# Patient Record
Sex: Female | Born: 1955 | Race: White | Hispanic: No | Marital: Single | Smoking: Former smoker
Health system: Southern US, Community
[De-identification: ages and names within clinical notes are randomized; demographics above are authoritative.]

## PROBLEM LIST (undated history)

## (undated) DIAGNOSIS — C50919 Malignant neoplasm of unspecified site of unspecified female breast: Secondary | ICD-10-CM

## (undated) DIAGNOSIS — M3500 Sicca syndrome, unspecified: Secondary | ICD-10-CM

## (undated) DIAGNOSIS — R0602 Shortness of breath: Secondary | ICD-10-CM

## (undated) DIAGNOSIS — G4730 Sleep apnea, unspecified: Secondary | ICD-10-CM

## (undated) DIAGNOSIS — M609 Myositis, unspecified: Secondary | ICD-10-CM

## (undated) DIAGNOSIS — M791 Myalgia, unspecified site: Secondary | ICD-10-CM

## (undated) DIAGNOSIS — E785 Hyperlipidemia, unspecified: Secondary | ICD-10-CM

## (undated) DIAGNOSIS — L500 Allergic urticaria: Secondary | ICD-10-CM

## (undated) DIAGNOSIS — K5790 Diverticulosis of intestine, part unspecified, without perforation or abscess without bleeding: Secondary | ICD-10-CM

## (undated) DIAGNOSIS — M359 Systemic involvement of connective tissue, unspecified: Secondary | ICD-10-CM

## (undated) DIAGNOSIS — D689 Coagulation defect, unspecified: Secondary | ICD-10-CM

## (undated) DIAGNOSIS — G894 Chronic pain syndrome: Secondary | ICD-10-CM

## (undated) DIAGNOSIS — I890 Lymphedema, not elsewhere classified: Secondary | ICD-10-CM

## (undated) DIAGNOSIS — K219 Gastro-esophageal reflux disease without esophagitis: Secondary | ICD-10-CM

## (undated) DIAGNOSIS — I219 Acute myocardial infarction, unspecified: Secondary | ICD-10-CM

## (undated) DIAGNOSIS — H6690 Otitis media, unspecified, unspecified ear: Secondary | ICD-10-CM

## (undated) DIAGNOSIS — M6089 Other myositis, multiple sites: Secondary | ICD-10-CM

## (undated) DIAGNOSIS — K279 Peptic ulcer, site unspecified, unspecified as acute or chronic, without hemorrhage or perforation: Secondary | ICD-10-CM

## (undated) DIAGNOSIS — M6080 Other myositis, unspecified site: Secondary | ICD-10-CM

## (undated) DIAGNOSIS — R1013 Epigastric pain: Secondary | ICD-10-CM

## (undated) DIAGNOSIS — R011 Cardiac murmur, unspecified: Secondary | ICD-10-CM

## (undated) DIAGNOSIS — R9389 Abnormal findings on diagnostic imaging of other specified body structures: Secondary | ICD-10-CM

## (undated) DIAGNOSIS — M797 Fibromyalgia: Secondary | ICD-10-CM

## (undated) HISTORY — DX: Abnormal findings on diagnostic imaging of other specified body structures: R93.89

## (undated) HISTORY — DX: Lymphedema, not elsewhere classified: I89.0

## (undated) HISTORY — DX: Peptic ulcer, site unspecified, unspecified as acute or chronic, without hemorrhage or perforation: K27.9

## (undated) HISTORY — DX: Cardiac murmur, unspecified: R01.1

## (undated) HISTORY — DX: Hyperlipidemia, unspecified: E78.5

## (undated) HISTORY — DX: Fibromyalgia: M79.7

## (undated) HISTORY — DX: Acute myocardial infarction, unspecified: I21.9

## (undated) HISTORY — DX: Malignant neoplasm of unspecified site of unspecified female breast: C50.919

## (undated) HISTORY — PX: BREAST SURGERY: SHX581

## (undated) HISTORY — DX: Otitis media, unspecified, unspecified ear: H66.90

## (undated) HISTORY — DX: Diverticulosis of intestine, part unspecified, without perforation or abscess without bleeding: K57.90

---

## 2006-07-16 ENCOUNTER — Ambulatory Visit: Payer: Self-pay

## 2007-10-19 ENCOUNTER — Ambulatory Visit: Payer: Self-pay | Admitting: Internal Medicine

## 2008-01-26 ENCOUNTER — Ambulatory Visit: Payer: Self-pay | Admitting: Internal Medicine

## 2008-04-11 ENCOUNTER — Ambulatory Visit: Payer: Self-pay | Admitting: Internal Medicine

## 2008-05-08 ENCOUNTER — Emergency Department: Payer: Self-pay | Admitting: Emergency Medicine

## 2008-12-12 ENCOUNTER — Ambulatory Visit: Payer: Self-pay | Admitting: Internal Medicine

## 2009-12-21 ENCOUNTER — Ambulatory Visit: Payer: Self-pay | Admitting: Internal Medicine

## 2010-02-05 ENCOUNTER — Ambulatory Visit: Payer: Self-pay | Admitting: Internal Medicine

## 2010-06-27 ENCOUNTER — Ambulatory Visit: Payer: Self-pay | Admitting: Internal Medicine

## 2010-12-27 ENCOUNTER — Ambulatory Visit: Payer: Self-pay | Admitting: Internal Medicine

## 2010-12-27 LAB — HM MAMMOGRAPHY: HM Mammogram: NORMAL

## 2011-04-28 LAB — HM PAP SMEAR: HM Pap smear: NORMAL

## 2011-05-27 ENCOUNTER — Telehealth: Payer: Self-pay | Admitting: Internal Medicine

## 2011-05-27 NOTE — Telephone Encounter (Signed)
We can put her in at 11:30 tomorrow morning, but let her know this will be an overbook and she may have to wait.

## 2011-05-27 NOTE — Telephone Encounter (Signed)
Please call her.  She said that she would take the Wednesday appt, until she heard from you.

## 2011-05-29 ENCOUNTER — Encounter: Payer: Self-pay | Admitting: Internal Medicine

## 2011-05-29 ENCOUNTER — Ambulatory Visit (INDEPENDENT_AMBULATORY_CARE_PROVIDER_SITE_OTHER): Payer: BC Managed Care – PPO | Admitting: Internal Medicine

## 2011-05-29 VITALS — BP 112/74 | HR 75 | Temp 98.6°F | Resp 16 | Ht 63.0 in | Wt 179.2 lb

## 2011-05-29 DIAGNOSIS — E119 Type 2 diabetes mellitus without complications: Secondary | ICD-10-CM | POA: Insufficient documentation

## 2011-05-29 DIAGNOSIS — J329 Chronic sinusitis, unspecified: Secondary | ICD-10-CM

## 2011-05-29 DIAGNOSIS — H6693 Otitis media, unspecified, bilateral: Secondary | ICD-10-CM | POA: Insufficient documentation

## 2011-05-29 MED ORDER — SULFAMETHOXAZOLE-TRIMETHOPRIM 800-160 MG PO TABS: 1.0000 | ORAL_TABLET | Freq: Two times a day (BID) | ORAL | Status: AC

## 2011-05-29 NOTE — Progress Notes (Signed)
Subjective:    Patient ID: Kristina Mccoy. Rosario, female    DOB: 11-22-1955, 55 y.o.   MRN: [id number]  HPI Kristina Mccoy is a 55 year old female with a history of chronic otitis media. She has previously been evaluated twice by specialists for this condition. Recently she has had several courses of antibiotics for recurrent otitis media, most recently 2 weeks ago at which time she received a seven-day course of Levaquin. She reports modest improvement with Levaquin and then almost immediate return of her symptoms after completing antibiotics. Symptoms are described as decreased hearing, ear pain, sinus congestion, headache, purulent nasal drainage, bilateral eyes swelling. She denies any fevers but does report some chills. She denies any cough, shortness of breath, or chest pain.  Outpatient Encounter Prescriptions as of 05/29/2011  Medication Sig Dispense Refill  . ALPRAZolam (XANAX) 0.5 MG tablet Take 0.5 mg by mouth 2 (two) times daily as needed.        . ciprofloxacin (CIPRO) 500 MG/5ML (10%) suspension Take by mouth 2 (two) times daily.        Marland Kitchen GLIMEPIRIDE PO Take 1 tablet by mouth daily.        . ranitidine (ZANTAC) 300 MG tablet Take 300 mg by mouth at bedtime.        . sulfamethoxazole-trimethoprim (SEPTRA DS) 800-160 MG per tablet Take 1 tablet by mouth 2 (two) times daily.  28 tablet  0    Review of Systems  Constitutional: Positive for chills and fatigue. Negative for fever and unexpected weight change.  HENT: Positive for ear pain, congestion, facial swelling, postnasal drip and sinus pressure. Negative for hearing loss, nosebleeds, sore throat, rhinorrhea, sneezing, mouth sores, trouble swallowing, neck pain, neck stiffness, voice change, tinnitus and ear discharge.   Eyes: Negative for pain, discharge, redness and visual disturbance.  Respiratory: Negative for cough, chest tightness, shortness of breath, wheezing and stridor.   Cardiovascular: Negative for chest pain, palpitations and leg  swelling.  Musculoskeletal: Negative for myalgias and arthralgias.  Skin: Negative for color change and rash.  Neurological: Negative for dizziness, weakness, light-headedness and headaches.  Hematological: Negative for adenopathy.    BP 112/74  Pulse 75  Temp(Src) 98.6 F (37 C) (Oral)  Resp 16  Ht 5\' 3"  (1.6 m)  Wt 179 lb 4 oz (81.307 kg)  BMI 31.75 kg/m2  SpO2 96%     Objective:   Physical Exam  Constitutional: She is oriented to person, place, and time. She appears well-developed and well-nourished. No distress.  HENT:  Head: Normocephalic and atraumatic.  Right Ear: External ear normal. No drainage, swelling or tenderness. Tympanic membrane is bulging. Tympanic membrane is not perforated and not erythematous. A middle ear effusion is present.  Left Ear: External ear normal. No drainage, swelling or tenderness. Tympanic membrane is erythematous and bulging. Tympanic membrane is not perforated. A middle ear effusion is present.  Nose: Nose normal.  Mouth/Throat: Oropharynx is clear and moist. No oropharyngeal exudate.  Eyes: Conjunctivae and EOM are normal. Pupils are equal, round, and reactive to light. Right eye exhibits no discharge. Left eye exhibits no discharge. No scleral icterus.  Neck: Normal range of motion. Neck supple. No tracheal deviation present. No thyromegaly present.  Cardiovascular: Normal rate, regular rhythm, normal heart sounds and intact distal pulses.  Exam reveals no gallop and no friction rub.   No murmur heard. Pulmonary/Chest: Effort normal and breath sounds normal. No accessory muscle usage. Not tachypneic. No respiratory distress. She has no wheezes. She has  no rales. She exhibits no tenderness.  Musculoskeletal: Normal range of motion. She exhibits no edema and no tenderness.  Lymphadenopathy:    She has no cervical adenopathy.  Neurological: She is alert and oriented to person, place, and time. No cranial nerve deficit. She exhibits normal muscle  tone. Coordination normal.  Skin: Skin is warm and dry. No rash noted. She is not diaphoretic. No erythema. No pallor.  Psychiatric: She has a normal mood and affect. Her behavior is normal. Judgment and thought content normal.          Assessment & Plan:  1. Sinusitis - patient with recurrent episodes of sinusitis and otitis media. Exam today is consistent with left OM and bilateral maxillary sinusitis. She has completed several courses of antibiotics including Augmentin and Levaquin, each time having brief improvement and then recurrent symptoms. She does have a history of diabetes mellitus, and a question whether she may have an atypical or resistant infection. She has been evaluated by ENT however no culture has been performed to date. We will request a CT of her sinuses for further evaluation. We will also refer her to allergy and immunology for evaluation. Question if she may have allergies which are triggering her recurrent infections. Also question if she may have decreased immune function given her history of chronic recurrent foot infections as a child and as an adult. We will start her on Bactrim double strength tablets twice daily x2 weeks while workup is being completed. She will add Zyrtec to her regimen. She will continue to use Advil when necessary. She will call or return to clinic immediately if symptoms worsen. Otherwise we will see her back in 2 weeks.

## 2011-05-29 NOTE — Patient Instructions (Signed)

## 2011-06-11 ENCOUNTER — Ambulatory Visit: Payer: Self-pay | Admitting: Internal Medicine

## 2011-06-14 ENCOUNTER — Ambulatory Visit (INDEPENDENT_AMBULATORY_CARE_PROVIDER_SITE_OTHER): Payer: BC Managed Care – PPO | Admitting: Internal Medicine

## 2011-06-14 ENCOUNTER — Encounter: Payer: Self-pay | Admitting: Internal Medicine

## 2011-06-14 VITALS — BP 115/74 | HR 76 | Temp 98.2°F | Resp 16 | Ht 63.0 in | Wt 133.5 lb

## 2011-06-14 DIAGNOSIS — E119 Type 2 diabetes mellitus without complications: Secondary | ICD-10-CM

## 2011-06-14 DIAGNOSIS — IMO0001 Reserved for inherently not codable concepts without codable children: Secondary | ICD-10-CM

## 2011-06-14 DIAGNOSIS — H6690 Otitis media, unspecified, unspecified ear: Secondary | ICD-10-CM

## 2011-06-14 DIAGNOSIS — M797 Fibromyalgia: Secondary | ICD-10-CM

## 2011-06-14 LAB — GLUCOSE, POCT (MANUAL RESULT ENTRY): POC Glucose: 149

## 2011-06-14 MED ORDER — GABAPENTIN 100 MG PO CAPS: 100.0000 mg | ORAL_CAPSULE | Freq: Three times a day (TID) | ORAL | Status: DC

## 2011-06-15 DIAGNOSIS — H6690 Otitis media, unspecified, unspecified ear: Secondary | ICD-10-CM | POA: Insufficient documentation

## 2011-06-15 DIAGNOSIS — M797 Fibromyalgia: Secondary | ICD-10-CM | POA: Insufficient documentation

## 2011-06-15 DIAGNOSIS — J329 Chronic sinusitis, unspecified: Secondary | ICD-10-CM | POA: Insufficient documentation

## 2011-06-15 NOTE — Progress Notes (Signed)
Subjective:    Patient ID: Kristina Mccoy. Holness, female    DOB: 1956/04/02, 55 y.o.   MRN: [id number]  HPI Ms. [name] is a 55 year old female with a history of diabetes, and chronic sinus and ear infections who presents for followup. She was recently treated with Bactrim for left otitis media. She reports significant improvement in her symptoms after a course of Bactrim. She also had a CT performed of her sinuses to help evaluate for changes consistent with chronic sinusitis. 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 She reports the swelling under her eyes and pain in her cheeks is much improved after the course of Bactrim. She denies any fever, chills, headache.  Today she is concerned about chronic pain which she has had in her legs and back for several years. She reports extensive evaluation including evaluation for osteoarthritis and rheumatoid arthritis. To date this evaluation has been negative. She questions whether she may have fibromyalgia. She was recently reading about fibromyalgia and found that she met several of the criteria.  Outpatient Encounter Prescriptions as of 06/14/2011  Medication Sig Dispense Refill  . ALPRAZolam (XANAX) 0.5 MG tablet Take 0.5 mg by mouth 2 (two) times daily as needed.        . ciprofloxacin (CIPRO) 500 MG/5ML (10%) suspension Take by mouth 2 (two) times daily.        Marland Kitchen GLIMEPIRIDE PO Take 0.5 mg by mouth 2 (two) times daily.       . meloxicam (MOBIC) 15 MG tablet Take 15 mg by mouth daily.        . ranitidine (ZANTAC) 300 MG tablet Take 300 mg by mouth at bedtime.        . gabapentin (NEURONTIN) 100 MG capsule Take 1 capsule (100 mg total) by mouth 3 (three) times daily.  90 capsule  5    Review of Systems  Constitutional: Negative for fever, chills, appetite change, fatigue and unexpected weight change.  HENT: Positive for ear pain, congestion, postnasal drip and sinus pressure. Negative for sore  throat, trouble swallowing, neck pain and voice change.   Eyes: Negative for visual disturbance.  Respiratory: Negative for cough, shortness of breath and wheezing.   Cardiovascular: Negative for chest pain, palpitations and leg swelling.  Gastrointestinal: Negative for nausea, vomiting, abdominal pain, diarrhea, constipation, blood in stool, abdominal distention and anal bleeding.  Genitourinary: Negative for dysuria and flank pain.  Musculoskeletal: Positive for myalgias, back pain, arthralgias and gait problem. Negative for joint swelling.  Skin: Negative for color change and rash.  Neurological: Positive for headaches. Negative for dizziness, tremors, weakness and numbness.  Hematological: Negative for adenopathy. Does not bruise/bleed easily.  Psychiatric/Behavioral: Negative for suicidal ideas, sleep disturbance and dysphoric mood. The patient is not nervous/anxious.    BP 115/74  Pulse 76  Temp(Src) 98.2 F (36.8 C) (Oral)  Resp 16  Ht 5\' 3"  (1.6 m)  Wt 133 lb 8 oz (60.555 kg)  BMI 23.65 kg/m2  SpO2 99%     Objective:   Physical Exam  Constitutional: She is oriented to person, place, and time. She appears well-developed and well-nourished. No distress.  HENT:  Head: Normocephalic and atraumatic.  Right Ear: Tympanic membrane, external ear and ear canal normal. Tympanic membrane is not erythematous. No middle ear effusion.  Left Ear: External ear and ear canal normal. Tympanic membrane is erythematous. A middle ear effusion is present.  Ears:  Nose: Nose normal.  Mouth/Throat: Oropharynx is clear and moist. No oropharyngeal exudate.       Left TM buldging with purulent fluid behind TM and mild erythema (however much improved compared to previous)  Eyes: Conjunctivae are normal. Pupils are equal, round, and reactive to light. Right eye exhibits no discharge. Left eye exhibits no discharge. No scleral icterus.  Neck: Normal range of motion. Neck supple. No tracheal deviation  present. No thyromegaly present.  Cardiovascular: Normal rate, regular rhythm, normal heart sounds and intact distal pulses.  Exam reveals no gallop and no friction rub.   No murmur heard. Pulmonary/Chest: Effort normal and breath sounds normal. No respiratory distress. She has no wheezes. She has no rales. She exhibits no tenderness.  Musculoskeletal: Normal range of motion. She exhibits no edema and no tenderness.  Lymphadenopathy:    She has no cervical adenopathy.  Neurological: She is alert and oriented to person, place, and time. No cranial nerve deficit. She exhibits normal muscle tone. Coordination normal.  Skin: Skin is warm and dry. No rash noted. She is not diaphoretic. No erythema. No pallor.  Psychiatric: She has a normal mood and affect. Her behavior is normal. Judgment and thought content normal.          Assessment & Plan:  1.  Chronic Otitis Media - patient with chronic left otitis media. Symptoms much improved after a course of Bactrim. CT of the sinuses showed no evidence of chronic sinusitis. She is scheduled to followup with an allergist to test for allergies which may be contributing to her recurrent symptoms. We will request a copy of this report. She will call or return to clinic should her symptoms recur.  2. Fibromyalgia - patient reports chronic pain in her legs and back. Today we went through the diagnostic criteria for fibromyalgia including symptom severity scaling. Based on 2010 criteria for fibromyalgia, her symptoms are consistent with this disorder. We discussed potential treatments for fibromyalgia including SSRIs such as Cymbalta. We also discussed using medication such as Neurontin or Lyrica for pain. We'll plan to start her on Neurontin 100 mg at bedtime to see if there's any improvement in her pain. She will return to clinic in one month.

## 2011-06-18 ENCOUNTER — Telehealth: Payer: Self-pay | Admitting: Internal Medicine

## 2011-06-18 NOTE — Telephone Encounter (Signed)
Patient called also and stated that when she takes the Gabapentin and it makes her extremely itchy especially around the eyes.  Please advise.  Informed her also that her that her CT sinuses were normal.

## 2011-06-18 NOTE — Telephone Encounter (Signed)
She should stop the Neurontin and we should change to Lyrica 25mg  at bedtime. We can call in a 30 day supply with 3 refills.

## 2011-06-24 ENCOUNTER — Other Ambulatory Visit: Payer: Self-pay | Admitting: *Deleted

## 2011-06-24 ENCOUNTER — Encounter: Payer: Self-pay | Admitting: Internal Medicine

## 2011-06-24 MED ORDER — PREGABALIN 25 MG PO CAPS: 25.0000 mg | ORAL_CAPSULE | Freq: Every day | ORAL | Status: DC

## 2011-06-24 NOTE — Telephone Encounter (Signed)
Patient notified to stop gabapentin. Also rx for lyrica sent to pharmacy.

## 2011-06-24 NOTE — Telephone Encounter (Signed)
That is fine 

## 2011-06-24 NOTE — Telephone Encounter (Signed)
Patient is asking if she can go back to the 150 mg of ranitidine bid instead of taking the 300 mg once daily. She says that that it worked better for her when she was taking it bid. Please advise.

## 2011-06-25 MED ORDER — RANITIDINE HCL 150 MG PO TABS: 150.0000 mg | ORAL_TABLET | Freq: Two times a day (BID) | ORAL | Status: DC

## 2011-06-25 NOTE — Telephone Encounter (Signed)
Patient notified. Medication updated in chart. Patient will also need new rx sent to pharmacy. Routed request to Dr. Dan Humphreys for approval.

## 2011-06-27 ENCOUNTER — Telehealth: Payer: Self-pay | Admitting: Internal Medicine

## 2011-06-27 NOTE — Telephone Encounter (Signed)
Spoke with patient and she stated she usually gets them through a company and she has already paid a copay so they will bring out supplies.  So she wants to put setting up for CPAP until she calls me back because she isn't happy with this company.

## 2011-06-27 NOTE — Telephone Encounter (Signed)
Patient called and stated she needs CPAP supplies.  Please advise.

## 2011-06-27 NOTE — Telephone Encounter (Signed)
We will need to find out which company she gets them through and then set this up.

## 2011-07-01 ENCOUNTER — Telehealth: Payer: Self-pay | Admitting: Internal Medicine

## 2011-07-01 DIAGNOSIS — G4730 Sleep apnea, unspecified: Secondary | ICD-10-CM

## 2011-07-01 NOTE — Telephone Encounter (Signed)
Printed out Rx to sign.

## 2011-07-15 ENCOUNTER — Institutional Professional Consult (permissible substitution): Payer: BC Managed Care – PPO | Admitting: Internal Medicine

## 2011-07-18 ENCOUNTER — Ambulatory Visit: Payer: BC Managed Care – PPO | Admitting: Internal Medicine

## 2011-08-14 ENCOUNTER — Ambulatory Visit (INDEPENDENT_AMBULATORY_CARE_PROVIDER_SITE_OTHER): Payer: BC Managed Care – PPO | Admitting: Internal Medicine

## 2011-08-14 ENCOUNTER — Encounter: Payer: Self-pay | Admitting: Internal Medicine

## 2011-08-14 DIAGNOSIS — E785 Hyperlipidemia, unspecified: Secondary | ICD-10-CM

## 2011-08-14 DIAGNOSIS — N390 Urinary tract infection, site not specified: Secondary | ICD-10-CM

## 2011-08-14 DIAGNOSIS — Z0000 Encounter for general adult medical examination without abnormal findings: Secondary | ICD-10-CM

## 2011-08-14 DIAGNOSIS — H6690 Otitis media, unspecified, unspecified ear: Secondary | ICD-10-CM

## 2011-08-14 DIAGNOSIS — E119 Type 2 diabetes mellitus without complications: Secondary | ICD-10-CM

## 2011-08-14 DIAGNOSIS — IMO0001 Reserved for inherently not codable concepts without codable children: Secondary | ICD-10-CM

## 2011-08-14 DIAGNOSIS — M797 Fibromyalgia: Secondary | ICD-10-CM

## 2011-08-14 MED ORDER — SULFAMETHOXAZOLE-TRIMETHOPRIM 800-160 MG PO TABS: 1.0000 | ORAL_TABLET | Freq: Two times a day (BID) | ORAL | Status: AC

## 2011-08-14 NOTE — Patient Instructions (Signed)
Call if symptoms not improving.

## 2011-08-14 NOTE — Progress Notes (Signed)
Subjective:    Patient ID: Kristina Mccoy, female    DOB: 07-11-1956, 55 y.o.   MRN: [id number]  HPI 55 year old female with a history of diabetes and fibromyalgia presents for followup. In regards to her diabetes she reports continued full compliance with her glyburide. She did not bring a record of her blood sugars today.  In regards to her fibromyalgia, she recently tried Neurontin but had an allergic reaction to this medication was swelling over both of her eyes. She did not have shortness of breath urticaria. She is planning to try Lyrica to see if any improvement in her chronic pain.  She has a history of chronic ear infections and reports recently pain in both of her ears, worse in her left ear. She denies any fever or chills. She denies any sore throat. She does have some mild nasal congestion. She denies any cough.  She notes several day h/o right flank pain and burning with urination. No hematuria noted. No fever/chills.  Outpatient Encounter Prescriptions as of 08/14/2011  Medication Sig Dispense Refill  . ALPRAZolam (XANAX) 0.5 MG tablet Take 0.5 mg by mouth 2 (two) times daily as needed.        . ciprofloxacin (CIPRO) 500 MG tablet Take 500 mg by mouth 2 (two) times daily.        Marland Kitchen GLIMEPIRIDE PO Take 5 mg by mouth 2 (two) times daily.       . meloxicam (MOBIC) 15 MG tablet Take 15 mg by mouth daily as needed.       . ranitidine (ZANTAC) 150 MG tablet Take 300 mg by mouth daily.        Marland Kitchen DISCONTD: ranitidine (ZANTAC) 150 MG tablet Take 1 tablet (150 mg total) by mouth 2 (two) times daily.  60 tablet  1  . pregabalin (LYRICA) 25 MG capsule Take 1 capsule (25 mg total) by mouth at bedtime.  30 capsule  3    Review of Systems  Constitutional: Negative for fever, chills, appetite change, fatigue and unexpected weight change.  HENT: Positive for hearing loss, ear pain and congestion. Negative for sore throat, neck pain and sinus pressure.   Eyes: Negative for visual disturbance.    Respiratory: Negative for cough, shortness of breath, wheezing and stridor.   Cardiovascular: Negative for chest pain, palpitations and leg swelling.  Gastrointestinal: Negative for nausea, vomiting, abdominal pain, diarrhea, constipation and abdominal distention.  Genitourinary: Positive for dysuria. Negative for frequency, hematuria and flank pain.  Musculoskeletal: Positive for myalgias and back pain. Negative for arthralgias and gait problem.  Skin: Negative for color change and rash.  Neurological: Negative for dizziness and headaches.  Hematological: Negative for adenopathy. Does not bruise/bleed easily.  Psychiatric/Behavioral: Negative for suicidal ideas, sleep disturbance and dysphoric mood. The patient is not nervous/anxious.        Objective:   Physical Exam  Constitutional: She is oriented to person, place, and time. She appears well-developed and well-nourished. No distress.  HENT:  Head: Normocephalic and atraumatic.  Right Ear: External ear normal. A middle ear effusion is present.  Left Ear: External ear normal. Tympanic membrane is erythematous. A middle ear effusion is present.  Nose: Nose normal.  Mouth/Throat: Oropharynx is clear and moist. No oropharyngeal exudate.  Eyes: Conjunctivae are normal. Pupils are equal, round, and reactive to light. Right eye exhibits no discharge. Left eye exhibits no discharge. No scleral icterus.  Neck: Normal range of motion. Neck supple. No tracheal deviation present. No thyromegaly present.  Cardiovascular: Normal rate, regular rhythm, normal heart sounds and intact distal pulses.  Exam reveals no gallop and no friction rub.   No murmur heard. Pulmonary/Chest: Effort normal and breath sounds normal. No respiratory distress. She has no wheezes. She has no rales. She exhibits no tenderness.  Abdominal: There is CVA tenderness (right).  Musculoskeletal: Normal range of motion. She exhibits no edema and no tenderness.  Lymphadenopathy:     She has no cervical adenopathy.  Neurological: She is alert and oriented to person, place, and time. No cranial nerve deficit. She exhibits normal muscle tone. Coordination normal.  Skin: Skin is warm and dry. No rash noted. She is not diaphoretic. No erythema. No pallor.  Psychiatric: She has a normal mood and affect. Her behavior is normal. Judgment and thought content normal.          Assessment & Plan:  1. Urinary tract infection - Symptoms of dysuria and right flank pain. Urinalysis negative.  Will send urine for culture and treat empirically with Bactrim. Pt will call if symptoms worsening.  2. Otitis media (chronic)- Recurrent. Will treat with bactrim x 2 weeks. Pt will call if symptoms worsening. Follow up 1 month.  3. Fibromyalgia - Had allergy to neurontin. Planning to try Lyrica to see if any improvement. If no improvement, will try cymbalta. Follow up 1 month.  4. Diabetes mellitus - Will check A1c with labs today.

## 2011-08-15 LAB — CBC WITH DIFFERENTIAL/PLATELET
Basophils Absolute: 0.1 10*3/uL (ref 0.0–0.1)
Basophils Relative: 0.7 % (ref 0.0–3.0)
Eosinophils Absolute: 0.5 10*3/uL (ref 0.0–0.7)
Eosinophils Relative: 4.8 % (ref 0.0–5.0)
HCT: 45.6 % (ref 36.0–46.0)
Hemoglobin: 15.5 g/dL — ABNORMAL HIGH (ref 12.0–15.0)
Lymphocytes Relative: 36.4 % (ref 12.0–46.0)
Lymphs Abs: 3.6 10*3/uL (ref 0.7–4.0)
MCHC: 34 g/dL (ref 30.0–36.0)
MCV: 94.1 fl (ref 78.0–100.0)
Monocytes Absolute: 0.6 10*3/uL (ref 0.1–1.0)
Monocytes Relative: 6.6 % (ref 3.0–12.0)
Neutro Abs: 5.1 10*3/uL (ref 1.4–7.7)
Neutrophils Relative %: 51.5 % (ref 43.0–77.0)
Platelets: 214 10*3/uL (ref 150.0–400.0)
RBC: 4.85 Mil/uL (ref 3.87–5.11)
RDW: 14.5 % (ref 11.5–14.6)
WBC: 9.9 10*3/uL (ref 4.5–10.5)

## 2011-08-15 LAB — LDL CHOLESTEROL, DIRECT: Direct LDL: 157.7 mg/dL

## 2011-08-15 LAB — LIPID PANEL
Cholesterol: 220 mg/dL — ABNORMAL HIGH (ref 0–200)
HDL: 46.1 mg/dL (ref >39.00)
Total CHOL/HDL Ratio: 5
Triglycerides: 214 mg/dL — ABNORMAL HIGH (ref 0.0–149.0)
VLDL: 42.8 mg/dL — ABNORMAL HIGH (ref 0.0–40.0)

## 2011-08-15 LAB — COMPREHENSIVE METABOLIC PANEL
ALT: 26 U/L (ref 0–35)
AST: 27 U/L (ref 0–37)
Albumin: 4.1 g/dL (ref 3.5–5.2)
Alkaline Phosphatase: 69 U/L (ref 39–117)
BUN: 12 mg/dL (ref 6–23)
CO2: 28 mEq/L (ref 19–32)
Calcium: 9.5 mg/dL (ref 8.4–10.5)
Chloride: 104 mEq/L (ref 96–112)
Creatinine, Ser: 0.8 mg/dL (ref 0.4–1.2)
GFR: 81.93 mL/min (ref >60.00)
Glucose, Bld: 76 mg/dL (ref 70–99)
Potassium: 4.7 mEq/L (ref 3.5–5.1)
Sodium: 140 mEq/L (ref 135–145)
Total Bilirubin: 0.3 mg/dL (ref 0.3–1.2)
Total Protein: 8.1 g/dL (ref 6.0–8.3)

## 2011-08-15 LAB — HEMOGLOBIN A1C: Hgb A1c MFr Bld: 7.5 % — ABNORMAL HIGH (ref 4.6–6.5)

## 2011-08-16 LAB — URINE CULTURE

## 2011-08-23 ENCOUNTER — Ambulatory Visit (INDEPENDENT_AMBULATORY_CARE_PROVIDER_SITE_OTHER): Payer: BC Managed Care – PPO | Admitting: *Deleted

## 2011-08-23 DIAGNOSIS — N390 Urinary tract infection, site not specified: Secondary | ICD-10-CM

## 2011-08-25 LAB — URINE CULTURE: Colony Count: 2000

## 2011-08-30 ENCOUNTER — Telehealth: Payer: Self-pay | Admitting: *Deleted

## 2011-08-30 NOTE — Telephone Encounter (Signed)
I spoke w/pt - she never heard back from last urine culture. I advised her that urine culture came back with insignificant growth. Pt reports that symptoms are clear. I advised her to call office for recheck if symptoms reoccurred, she agreed.

## 2011-09-16 ENCOUNTER — Encounter: Payer: Self-pay | Admitting: Internal Medicine

## 2011-09-16 ENCOUNTER — Ambulatory Visit (INDEPENDENT_AMBULATORY_CARE_PROVIDER_SITE_OTHER): Payer: BC Managed Care – PPO | Admitting: Internal Medicine

## 2011-09-16 VITALS — BP 120/62 | HR 73 | Temp 97.9°F | Wt 185.0 lb

## 2011-09-16 DIAGNOSIS — M797 Fibromyalgia: Secondary | ICD-10-CM

## 2011-09-16 DIAGNOSIS — IMO0001 Reserved for inherently not codable concepts without codable children: Secondary | ICD-10-CM

## 2011-09-16 DIAGNOSIS — K5730 Diverticulosis of large intestine without perforation or abscess without bleeding: Secondary | ICD-10-CM

## 2011-09-16 DIAGNOSIS — H9190 Unspecified hearing loss, unspecified ear: Secondary | ICD-10-CM | POA: Insufficient documentation

## 2011-09-16 MED ORDER — DULOXETINE HCL 30 MG PO CPEP: 30.0000 mg | ORAL_CAPSULE | Freq: Every day | ORAL | Status: DC

## 2011-09-16 MED ORDER — CIPROFLOXACIN HCL 500 MG PO TABS: 500.0000 mg | ORAL_TABLET | Freq: Two times a day (BID) | ORAL | Status: DC

## 2011-09-16 NOTE — Patient Instructions (Signed)
Prevent and Reverse Heart Disease by Neomia Dear  [email or web address]  Dr. Marijo Conception

## 2011-09-16 NOTE — Progress Notes (Signed)
Subjective:    Patient ID: Kristina Mccoy. [name], female    DOB: 1955-10-25, 55 y.o.   MRN: [id number]  HPI 55YO female with fibromyalgia, DM, chronic OM presents for follow up. Chronic fibromyalgia pain, which is described as aching and most prominent in joints of hands, knees, shoulders. Tried both neurontin and lyrica and had allergic reaction. Currently using meloxicam alone with minimal improvement.  Also notes persistent hearing loss. Had hearing testing x2 which confirmed loss and need for hearing aids. Has not yet pursued hearing aids.  Notes that she had no improvement in hearing or ear pain with recent course of bactrim, but did have some improvement with cipro. No current ear pain, fever, chills, congestion.  Outpatient Encounter Prescriptions as of 09/16/2011  Medication Sig Dispense Refill  . ALPRAZolam (XANAX) 0.5 MG tablet Take 0.5 mg by mouth 2 (two) times daily as needed.        . glyBURIDE (DIABETA) 5 MG tablet Take 5 mg by mouth 2 (two) times daily.        . meloxicam (MOBIC) 15 MG tablet Take 15 mg by mouth daily as needed.       . ranitidine (ZANTAC) 150 MG tablet Take 300 mg by mouth daily.          Review of Systems  Constitutional: Negative for fever, chills, appetite change, fatigue and unexpected weight change.  HENT: Positive for hearing loss. Negative for ear pain, congestion, neck pain, voice change and sinus pressure.   Eyes: Negative for visual disturbance.  Respiratory: Negative for cough, shortness of breath, wheezing and stridor.   Cardiovascular: Negative for chest pain, palpitations and leg swelling.  Gastrointestinal: Negative for vomiting and abdominal pain.  Genitourinary: Negative for dysuria and flank pain.  Musculoskeletal: Positive for myalgias and arthralgias. Negative for gait problem.  Skin: Negative for color change and rash.  Neurological: Negative for dizziness and headaches.  Hematological: Negative for adenopathy. Does not bruise/bleed easily.    Psychiatric/Behavioral: Negative for suicidal ideas, sleep disturbance and dysphoric mood. The patient is not nervous/anxious.    BP 120/62  Pulse 73  Temp(Src) 97.9 F (36.6 C) (Oral)  Wt 185 lb (83.915 kg)  SpO2 97%     Objective:   Physical Exam  Constitutional: She is oriented to person, place, and time. She appears well-developed and well-nourished. No distress.  HENT:  Head: Normocephalic and atraumatic.  Right Ear: External ear normal.  Left Ear: External ear normal.  Nose: Nose normal.  Mouth/Throat: Oropharynx is clear and moist. No oropharyngeal exudate.  Eyes: Conjunctivae are normal. Pupils are equal, round, and reactive to light. Right eye exhibits no discharge. Left eye exhibits no discharge. No scleral icterus.  Neck: Normal range of motion. Neck supple. No tracheal deviation present. No thyromegaly present.  Cardiovascular: Normal rate, regular rhythm, normal heart sounds and intact distal pulses.  Exam reveals no gallop and no friction rub.   No murmur heard. Pulmonary/Chest: Effort normal and breath sounds normal. No respiratory distress. She has no wheezes. She has no rales. She exhibits no tenderness.  Musculoskeletal: Normal range of motion. She exhibits no edema and no tenderness.  Lymphadenopathy:    She has no cervical adenopathy.  Neurological: She is alert and oriented to person, place, and time. No cranial nerve deficit. She exhibits normal muscle tone. Coordination normal.  Skin: Skin is warm and dry. No rash noted. She is not diaphoretic. No erythema. No pallor.  Psychiatric: She has a normal mood and affect.  Her behavior is normal. Judgment and thought content normal.          Assessment & Plan:  1. Fibromyalgia - Adverse reactions to both neurontin and lyrica. Will try cymbalta. Follow up 1 month or sooner if any problems develop.  2. 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  Exam today is normal. Encouraged her to proceed with audiology  evaluation. Follow up prn.

## 2011-10-19 ENCOUNTER — Other Ambulatory Visit: Payer: Self-pay | Admitting: Internal Medicine

## 2011-10-20 NOTE — Telephone Encounter (Signed)
Per message, patient"only had 30 left on rx but normally gets 60

## 2011-10-21 ENCOUNTER — Ambulatory Visit (INDEPENDENT_AMBULATORY_CARE_PROVIDER_SITE_OTHER): Payer: BC Managed Care – PPO | Admitting: Internal Medicine

## 2011-10-21 ENCOUNTER — Encounter: Payer: Self-pay | Admitting: Internal Medicine

## 2011-10-21 VITALS — BP 112/62 | HR 85 | Temp 98.0°F | Ht 64.0 in | Wt 187.0 lb

## 2011-10-21 DIAGNOSIS — E119 Type 2 diabetes mellitus without complications: Secondary | ICD-10-CM

## 2011-10-21 DIAGNOSIS — H6690 Otitis media, unspecified, unspecified ear: Secondary | ICD-10-CM

## 2011-10-21 DIAGNOSIS — IMO0001 Reserved for inherently not codable concepts without codable children: Secondary | ICD-10-CM

## 2011-10-21 DIAGNOSIS — M797 Fibromyalgia: Secondary | ICD-10-CM

## 2011-10-21 MED ORDER — DULOXETINE HCL 60 MG PO CPEP: 60.0000 mg | ORAL_CAPSULE | Freq: Every day | ORAL | Status: DC

## 2011-10-21 MED ORDER — GLYBURIDE 5 MG PO TABS: 5.0000 mg | ORAL_TABLET | Freq: Two times a day (BID) | ORAL | Status: DC

## 2011-10-21 NOTE — Progress Notes (Signed)
Subjective:    Patient ID: Kristina Mccoy, female    DOB: 01-Apr-1956, 56 y.o.   MRN: [id number]  HPI 56 year old female with a history of fibromyalgia, diabetes, and chronic ear infections presents for followup. In regards to her fibromyalgia, she continues to experience diffuse muscle and joint pain. She reports no improvement with the use of Cymbalta 30 mg daily. She was unable to tolerate both Lyrica and Neurontin because of side effects.  In regards to her diabetes, she reports good control of her blood sugars with the use of glyburide. She denies any sugars greater than 250 or below 80. She reports full compliance with her medication. Her last hemoglobin A1c 2 months ago was 7.5%.  In regards to her chronic ear infections, she notes that early this morning she developed some right ear pain. She also has pain on the right side of her throat. She denies any cough, congestion, fever or chills. She has not taken any medication for this.  Outpatient Encounter Prescriptions as of 10/21/2011  Medication Sig Dispense Refill  . ALPRAZolam (XANAX) 0.5 MG tablet Take 0.5 mg by mouth 2 (two) times daily as needed.        . ciprofloxacin (CIPRO) 500 MG tablet Take 1 tablet (500 mg total) by mouth 2 (two) times daily.  28 tablet  1  . DULoxetine (CYMBALTA) 60 MG capsule Take 1 capsule (60 mg total) by mouth daily.  30 capsule  11  . glyBURIDE (DIABETA) 5 MG tablet Take 1 tablet (5 mg total) by mouth 2 (two) times daily with a meal.  60 tablet  6  . meloxicam (MOBIC) 15 MG tablet Take 15 mg by mouth daily as needed.       . ranitidine (ZANTAC) 150 MG tablet Take 300 mg by mouth daily.          Review of Systems  Constitutional: Negative for fever, chills, appetite change, fatigue and unexpected weight change.  HENT: Positive for hearing loss and ear pain. Negative for congestion, sore throat, trouble swallowing, neck pain, voice change and sinus pressure.   Eyes: Negative for visual disturbance.    Respiratory: Negative for cough, shortness of breath, wheezing and stridor.   Cardiovascular: Negative for chest pain, palpitations and leg swelling.  Gastrointestinal: Negative for nausea, vomiting, abdominal pain, diarrhea, constipation, blood in stool, abdominal distention and anal bleeding.  Genitourinary: Negative for dysuria and flank pain.  Musculoskeletal: Positive for myalgias and arthralgias. Negative for gait problem.  Skin: Negative for color change and rash.  Neurological: Negative for dizziness and headaches.  Hematological: Negative for adenopathy. Does not bruise/bleed easily.  Psychiatric/Behavioral: Negative for suicidal ideas, sleep disturbance and dysphoric mood. The patient is not nervous/anxious.    BP 112/62  Pulse 85  Temp(Src) 98 F (36.7 C) (Oral)  Ht 5\' 4"  (1.626 m)  Wt 187 lb (84.823 kg)  BMI 32.10 kg/m2  SpO2 95%     Objective:   Physical Exam  Constitutional: She is oriented to person, place, and time. She appears well-developed and well-nourished. No distress.  HENT:  Head: Normocephalic and atraumatic.  Right Ear: External ear normal.  Left Ear: External ear normal.  Nose: Nose normal.  Mouth/Throat: Oropharynx is clear and moist. No oropharyngeal exudate.  Eyes: Conjunctivae are normal. Pupils are equal, round, and reactive to light. Right eye exhibits no discharge. Left eye exhibits no discharge. No scleral icterus.  Neck: Normal range of motion. Neck supple. No tracheal deviation present. No thyromegaly present.  Cardiovascular: Normal rate, regular rhythm, normal heart sounds and intact distal pulses.  Exam reveals no gallop and no friction rub.   No murmur heard. Pulmonary/Chest: Effort normal and breath sounds normal. No respiratory distress. She has no wheezes. She has no rales. She exhibits no tenderness.  Musculoskeletal: Normal range of motion. She exhibits no edema and no tenderness.  Lymphadenopathy:    She has no cervical adenopathy.   Neurological: She is alert and oriented to person, place, and time. No cranial nerve deficit. She exhibits normal muscle tone. Coordination normal.  Skin: Skin is warm and dry. No rash noted. She is not diaphoretic. No erythema. No pallor.  Psychiatric: She has a normal mood and affect. Her behavior is normal. Judgment and thought content normal.          Assessment & Plan:

## 2011-10-21 NOTE — Assessment & Plan Note (Signed)
Symptoms poorly controlled on Cymbalta 30 mg daily. Will increase dose of Cymbalta to 60 mg daily. Patient will e-mail or call with update. She will followup in 3 months.

## 2011-10-21 NOTE — Assessment & Plan Note (Signed)
Exam is normal today however patient complains of right ear pain which started this morning. Will try to manage conservatively with nonsteroidals as needed. If no improvement or symptoms worsen, she will start Cipro. She will email or call with update.

## 2011-10-21 NOTE — Assessment & Plan Note (Signed)
Patient reports good control of blood sugars on glyburide. We'll plan to recheck hemoglobin A1c with labs next month. Goal A1c is 7%. Patient will followup in 3 months.

## 2011-11-04 ENCOUNTER — Telehealth: Payer: Self-pay | Admitting: *Deleted

## 2011-11-04 MED ORDER — ALPRAZOLAM 0.5 MG PO TABS: 0.5000 mg | ORAL_TABLET | Freq: Two times a day (BID) | ORAL | Status: DC | PRN

## 2011-11-04 NOTE — Telephone Encounter (Signed)
Called in, left vm for pt to check w/her pharm

## 2011-11-04 NOTE — Telephone Encounter (Signed)
Patient requesting RF of xanax.  

## 2011-11-04 NOTE — Telephone Encounter (Signed)
Fine to fill 1 month supply with 3 refill

## 2011-12-11 ENCOUNTER — Telehealth: Payer: Self-pay | Admitting: Internal Medicine

## 2011-12-11 DIAGNOSIS — Z1231 Encounter for screening mammogram for malignant neoplasm of breast: Secondary | ICD-10-CM

## 2011-12-11 NOTE — Telephone Encounter (Signed)
[phone] Pt received  letter time for mammogram.  She needs order for [facility] in [place]

## 2011-12-11 NOTE — Telephone Encounter (Signed)
Order entered

## 2012-01-14 ENCOUNTER — Ambulatory Visit: Payer: Self-pay | Admitting: Internal Medicine

## 2012-01-14 LAB — HM COLONOSCOPY

## 2012-01-21 ENCOUNTER — Encounter: Payer: Self-pay | Admitting: Internal Medicine

## 2012-02-19 ENCOUNTER — Other Ambulatory Visit: Payer: Self-pay | Admitting: Internal Medicine

## 2012-02-19 ENCOUNTER — Other Ambulatory Visit: Payer: Self-pay | Admitting: *Deleted

## 2012-02-19 MED ORDER — MELOXICAM 15 MG PO TABS: 15.0000 mg | ORAL_TABLET | Freq: Every day | ORAL | Status: DC | PRN

## 2012-02-19 MED ORDER — ALPRAZOLAM 0.5 MG PO TABS: 0.5000 mg | ORAL_TABLET | Freq: Two times a day (BID) | ORAL | Status: DC | PRN

## 2012-02-19 NOTE — Telephone Encounter (Signed)
Done

## 2012-02-19 NOTE — Telephone Encounter (Signed)
Fine to refill both medications, same instructions

## 2012-02-19 NOTE — Telephone Encounter (Signed)
Alprazolam request [last refill 02.04.13 #[id number]] Meloxicam request [last fill date 05.09.2011 when originally written] Please advise.

## 2012-03-07 ENCOUNTER — Encounter: Payer: Self-pay | Admitting: Internal Medicine

## 2012-03-12 ENCOUNTER — Ambulatory Visit: Payer: BC Managed Care – PPO | Admitting: Internal Medicine

## 2012-03-23 ENCOUNTER — Encounter: Payer: Self-pay | Admitting: Internal Medicine

## 2012-03-23 ENCOUNTER — Ambulatory Visit: Payer: BC Managed Care – PPO | Admitting: Internal Medicine

## 2012-05-18 NOTE — Progress Notes (Signed)
  Subjective:    Patient ID: Kristina Mccoy. [place], female    DOB: [birth year], 56 y.o.   MRN: [id number]  HPI  Nurse only   Review of Systems     Objective:   Physical Exam        Assessment & Plan:

## 2012-05-21 ENCOUNTER — Telehealth: Payer: Self-pay | Admitting: Internal Medicine

## 2012-05-21 MED ORDER — NYSTATIN 100000 UNIT/GM EX CREA: 1.0000 "application " | TOPICAL_CREAM | Freq: Two times a day (BID) | CUTANEOUS | Status: DC

## 2012-05-21 MED ORDER — FLUCONAZOLE 150 MG PO TABS: 150.0000 mg | ORAL_TABLET | Freq: Once | ORAL | Status: DC

## 2012-05-21 NOTE — Telephone Encounter (Signed)
Fine to call in Nystatin cream apply bid x 7 days. If no improvement or symptoms worsening, needs to be seen.

## 2012-05-21 NOTE — Telephone Encounter (Signed)
Menopause- no menses. Caller: Kristina Mccoy/Patient; Patient Name: Kristina Mccoy; PCP: Ronna Polio; Calling about recently having to take Cipro for Diverticulitis- about one week ago and now she is having vaginal irritation itching and redness- onset 05/19/12. Small bump on vulva- told r/t to sugar being elevated. Has been using topical Nystatin Cream and not helping. Triage and Care advice per Vaginal Irritation Protocol. Advised by Dr. Dan Humphreys to call if she starts with yeast infection sx and Diflucan can be called in. Also requesting refill of Nystatin Cream. She uses [facility].  Call Back #[phone].

## 2012-05-21 NOTE — Telephone Encounter (Signed)
That was incorrect. Fine to call Diflucan 150mg  po x 1

## 2012-05-21 NOTE — Telephone Encounter (Signed)
Rx's for nystatin cream and diflucan sent to [facility], patient advised via telephone.

## 2012-05-27 ENCOUNTER — Ambulatory Visit (INDEPENDENT_AMBULATORY_CARE_PROVIDER_SITE_OTHER): Payer: BC Managed Care – PPO | Admitting: Internal Medicine

## 2012-05-27 ENCOUNTER — Encounter: Payer: Self-pay | Admitting: Internal Medicine

## 2012-05-27 VITALS — BP 130/82 | HR 69 | Temp 98.3°F | Ht 64.0 in | Wt 185.8 lb

## 2012-05-27 DIAGNOSIS — E1169 Type 2 diabetes mellitus with other specified complication: Secondary | ICD-10-CM | POA: Insufficient documentation

## 2012-05-27 DIAGNOSIS — B373 Candidiasis of vulva and vagina: Secondary | ICD-10-CM

## 2012-05-27 DIAGNOSIS — B3731 Acute candidiasis of vulva and vagina: Secondary | ICD-10-CM

## 2012-05-27 DIAGNOSIS — K5732 Diverticulitis of large intestine without perforation or abscess without bleeding: Secondary | ICD-10-CM

## 2012-05-27 DIAGNOSIS — K5792 Diverticulitis of intestine, part unspecified, without perforation or abscess without bleeding: Secondary | ICD-10-CM

## 2012-05-27 DIAGNOSIS — R5381 Other malaise: Secondary | ICD-10-CM

## 2012-05-27 DIAGNOSIS — R5383 Other fatigue: Secondary | ICD-10-CM

## 2012-05-27 DIAGNOSIS — M2550 Pain in unspecified joint: Secondary | ICD-10-CM

## 2012-05-27 DIAGNOSIS — E119 Type 2 diabetes mellitus without complications: Secondary | ICD-10-CM

## 2012-05-27 MED ORDER — HYOSCYAMINE SULFATE 0.125 MG PO TABS: 0.1250 mg | ORAL_TABLET | ORAL | Status: DC | PRN

## 2012-05-27 MED ORDER — FLUCONAZOLE 150 MG PO TABS: 150.0000 mg | ORAL_TABLET | Freq: Once | ORAL | Status: AC

## 2012-05-27 NOTE — Assessment & Plan Note (Signed)
Patient reports severe fatigue over the last several months. Unclear etiology. Will check basic labs including CBC, CMP, TSH, B12. Followup 2 weeks.

## 2012-05-27 NOTE — Assessment & Plan Note (Signed)
Patient with recurrent episodes of diverticulitis. Recent colonoscopy showed diverticula. Will use Levsin and Cipro as needed for recurrent symptoms. Explained to patient that she will need to be seen in clinic for flares because of potential risk of perforation and other complications.

## 2012-05-27 NOTE — Assessment & Plan Note (Signed)
Patient has been lost to followup for almost 9 months. She reports good control of blood sugar. Will repeat A1c with labs today. We'll continue glyburide. Followup 2-4 weeks.

## 2012-05-27 NOTE — Progress Notes (Signed)
Subjective:    Patient ID: Kristina Mccoy. [place], female    DOB: 1956-01-23, 56 y.o.   MRN: [id number]  HPI 56 year old female with history of diabetes, diverticulitis, and fibromyalgia presents for followup. She has been lost to followup for over 9 months. In regards to her diabetes, she reports that her blood sugars have been quite varied. She reports that fasting sugars are sometimes as low as 50 but postprandial sugars can be over 200. She reports full compliance with her medications.  She notes that she recently underwent colonoscopy which showed 3 diverticula. She has had some intermittent episodes of diverticulitis for which she has used Cipro with some improvement. She is also occasionally used Levsin with some improvement in left lower abdominal pain and cramping. She is not currently having abdominal pain, diarrhea, fever, or chills.  Regards to fibromyalgia, she stopped taking her Cymbalta. In the past, we had tried Neurontin and Lyrica but she was allergic to these medications. We had tried Cymbalta with some improvement, however she reports that this medication made her feel drowsy. She tapered off medication and is currently using meloxicam alone with minimal improvement in her symptoms. She reports that the pain is aching in both her joints and in bones in her legs. It is most prominent in the lower legs and distal arms. She denies any warmth over her joints or joint swelling. Pain has limited her ability to function. He has also interfered with her sleep.  Outpatient Encounter Prescriptions as of 05/27/2012  Medication Sig Dispense Refill  . ALPRAZolam (XANAX) 0.5 MG tablet Take 1 tablet (0.5 mg total) by mouth 2 (two) times daily as needed.  60 tablet  2  . ciprofloxacin (CIPRO) 500 MG tablet Take 1 tablet (500 mg total) by mouth 2 (two) times daily.  28 tablet  1  . glyBURIDE (DIABETA) 5 MG tablet Take 1 tablet (5 mg total) by mouth 2 (two) times daily with a meal.  60 tablet  6  .  meloxicam (MOBIC) 15 MG tablet Take 1 tablet (15 mg total) by mouth daily as needed.  30 tablet  2  . nystatin cream (MYCOSTATIN) Apply 1 application topically 2 (two) times daily.  30 g  0  . ranitidine (ZANTAC) 150 MG tablet Take 300 mg by mouth daily.        . fluconazole (DIFLUCAN) 150 MG tablet Take 1 tablet (150 mg total) by mouth once.  1 tablet  0  . hyoscyamine (LEVSIN, ANASPAZ) 0.125 MG tablet Take 1 tablet (0.125 mg total) by mouth every 4 (four) hours as needed for cramping.  30 tablet  3   BP 130/82  Pulse 69  Temp 98.3 F (36.8 C) (Oral)  Ht 5\' 4"  (1.626 m)  Wt 185 lb 12 oz (84.256 kg)  BMI 31.88 kg/m2  SpO2 99%  Review of Systems  Constitutional: Positive for fatigue. Negative for fever, chills, appetite change and unexpected weight change.  HENT: Negative for ear pain, congestion, sore throat, trouble swallowing, neck pain, voice change and sinus pressure.   Eyes: Negative for visual disturbance.  Respiratory: Negative for cough, shortness of breath, wheezing and stridor.   Cardiovascular: Negative for chest pain, palpitations and leg swelling.  Gastrointestinal: Positive for abdominal pain. Negative for nausea, vomiting, diarrhea, constipation, blood in stool, abdominal distention and anal bleeding.  Genitourinary: Negative for dysuria and flank pain.  Musculoskeletal: Positive for myalgias, back pain and arthralgias. Negative for gait problem.  Skin: Negative for color change and  rash.  Neurological: Negative for dizziness and headaches.  Hematological: Negative for adenopathy. Does not bruise/bleed easily.  Psychiatric/Behavioral: Negative for suicidal ideas, disturbed wake/sleep cycle and dysphoric mood. The patient is not nervous/anxious.        Objective:   Physical Exam  Constitutional: She is oriented to person, place, and time. She appears well-developed and well-nourished. No distress.  HENT:  Head: Normocephalic and atraumatic.  Right Ear: External ear  normal.  Left Ear: External ear normal.  Nose: Nose normal.  Mouth/Throat: Oropharynx is clear and moist. No oropharyngeal exudate.  Eyes: Conjunctivae are normal. Pupils are equal, round, and reactive to light. Right eye exhibits no discharge. Left eye exhibits no discharge. No scleral icterus.  Neck: Normal range of motion. Neck supple. No tracheal deviation present. No thyromegaly present.  Cardiovascular: Normal rate, regular rhythm, normal heart sounds and intact distal pulses.  Exam reveals no gallop and no friction rub.   No murmur heard. Pulmonary/Chest: Effort normal and breath sounds normal. No respiratory distress. She has no wheezes. She has no rales. She exhibits no tenderness.  Abdominal: Soft. Bowel sounds are normal. She exhibits no distension. There is tenderness (slight left lower quadrant).  Musculoskeletal: Normal range of motion. She exhibits no edema and no tenderness.       Right lower leg: She exhibits tenderness. She exhibits no edema.       Left lower leg: She exhibits tenderness. She exhibits no edema.  Lymphadenopathy:    She has no cervical adenopathy.  Neurological: She is alert and oriented to person, place, and time. No cranial nerve deficit. She exhibits normal muscle tone. Coordination normal.  Skin: Skin is warm and dry. No rash noted. She is not diaphoretic. No erythema. No pallor.  Psychiatric: She has a normal mood and affect. Her behavior is normal. Judgment and thought content normal.          Assessment & Plan:

## 2012-05-27 NOTE — Assessment & Plan Note (Signed)
Patient with diffuse arthralgia, particularly in lower extremities. Suspect secondary to fibromyalgia. Previous workup for lupus or other inflammatory arthropathies was normal. Will set up rheumatology evaluation.

## 2012-05-27 NOTE — Assessment & Plan Note (Signed)
Patient with recent episode of vaginal candidiasis. Will treat with Diflucan.

## 2012-05-28 LAB — HEMOGLOBIN A1C: Hgb A1c MFr Bld: 7.5 % — ABNORMAL HIGH (ref 4.6–6.5)

## 2012-05-28 LAB — CBC WITH DIFFERENTIAL/PLATELET
Basophils Absolute: 0 10*3/uL (ref 0.0–0.1)
Basophils Relative: 0.5 % (ref 0.0–3.0)
Eosinophils Absolute: 0.6 10*3/uL (ref 0.0–0.7)
Eosinophils Relative: 6.6 % — ABNORMAL HIGH (ref 0.0–5.0)
HCT: 45.3 % (ref 36.0–46.0)
Hemoglobin: 15.2 g/dL — ABNORMAL HIGH (ref 12.0–15.0)
Lymphocytes Relative: 37.5 % (ref 12.0–46.0)
Lymphs Abs: 3.5 10*3/uL (ref 0.7–4.0)
MCHC: 33.5 g/dL (ref 30.0–36.0)
MCV: 92.7 fl (ref 78.0–100.0)
Monocytes Absolute: 0.8 10*3/uL (ref 0.1–1.0)
Monocytes Relative: 8.1 % (ref 3.0–12.0)
Neutro Abs: 4.4 10*3/uL (ref 1.4–7.7)
Neutrophils Relative %: 47.3 % (ref 43.0–77.0)
Platelets: 197 10*3/uL (ref 150.0–400.0)
RBC: 4.88 Mil/uL (ref 3.87–5.11)
RDW: 14.4 % (ref 11.5–14.6)
WBC: 9.2 10*3/uL (ref 4.5–10.5)

## 2012-05-28 LAB — COMPREHENSIVE METABOLIC PANEL
ALT: 28 U/L (ref 0–35)
AST: 27 U/L (ref 0–37)
Albumin: 4.2 g/dL (ref 3.5–5.2)
Alkaline Phosphatase: 67 U/L (ref 39–117)
BUN: 15 mg/dL (ref 6–23)
CO2: 26 mEq/L (ref 19–32)
Calcium: 9.5 mg/dL (ref 8.4–10.5)
Chloride: 102 mEq/L (ref 96–112)
Creatinine, Ser: 1 mg/dL (ref 0.4–1.2)
GFR: 59.27 mL/min — ABNORMAL LOW (ref >60.00)
Glucose, Bld: 83 mg/dL (ref 70–99)
Potassium: 4.4 mEq/L (ref 3.5–5.1)
Sodium: 139 mEq/L (ref 135–145)
Total Bilirubin: 0.4 mg/dL (ref 0.3–1.2)
Total Protein: 7.8 g/dL (ref 6.0–8.3)

## 2012-05-28 LAB — MICROALBUMIN / CREATININE URINE RATIO
Creatinine,U: 30.7 mg/dL
Microalb Creat Ratio: 1.6 mg/g (ref 0.0–30.0)
Microalb, Ur: 0.5 mg/dL (ref 0.0–1.9)

## 2012-05-28 LAB — TSH: TSH: 0.96 u[IU]/mL (ref 0.35–5.50)

## 2012-05-28 LAB — T3, FREE: T3, Free: 2.6 pg/mL (ref 2.3–4.2)

## 2012-05-28 LAB — T4, FREE: Free T4: 0.86 ng/dL (ref 0.60–1.60)

## 2012-05-28 LAB — VITAMIN B12: Vitamin B-12: 361 pg/mL (ref 211–911)

## 2012-05-30 ENCOUNTER — Other Ambulatory Visit: Payer: Self-pay | Admitting: Internal Medicine

## 2012-06-02 ENCOUNTER — Encounter: Payer: Self-pay | Admitting: Internal Medicine

## 2012-06-04 ENCOUNTER — Other Ambulatory Visit: Payer: Self-pay | Admitting: *Deleted

## 2012-06-04 DIAGNOSIS — K5730 Diverticulosis of large intestine without perforation or abscess without bleeding: Secondary | ICD-10-CM

## 2012-06-04 MED ORDER — CIPROFLOXACIN HCL 500 MG PO TABS: 500.0000 mg | ORAL_TABLET | Freq: Two times a day (BID) | ORAL | Status: DC | PRN

## 2012-06-04 MED ORDER — ALPRAZOLAM 0.5 MG PO TABS: 0.5000 mg | ORAL_TABLET | Freq: Two times a day (BID) | ORAL | Status: DC | PRN

## 2012-06-04 MED ORDER — RANITIDINE HCL 300 MG PO TABS: 300.0000 mg | ORAL_TABLET | Freq: Every day | ORAL | Status: DC

## 2012-06-04 MED ORDER — GLYBURIDE 5 MG PO TABS: 5.0000 mg | ORAL_TABLET | Freq: Two times a day (BID) | ORAL | Status: DC

## 2012-06-05 MED ORDER — CIPROFLOXACIN HCL 500 MG PO TABS: 500.0000 mg | ORAL_TABLET | Freq: Two times a day (BID) | ORAL | Status: DC | PRN

## 2012-06-05 NOTE — Addendum Note (Signed)
Addended by: Gilmer Mor on: 06/05/2012 05:02 PM   Modules accepted: Orders

## 2012-06-05 NOTE — Telephone Encounter (Signed)
Rx for Alprazolam called to [facility] pharmacy, Rx for Cipro changed to one tablet by mouth twice daily for seven days, no refills.

## 2012-06-11 ENCOUNTER — Ambulatory Visit (INDEPENDENT_AMBULATORY_CARE_PROVIDER_SITE_OTHER): Payer: BC Managed Care – PPO | Admitting: Internal Medicine

## 2012-06-11 ENCOUNTER — Encounter: Payer: Self-pay | Admitting: Internal Medicine

## 2012-06-11 VITALS — BP 120/80 | HR 78 | Temp 98.3°F | Ht 64.0 in | Wt 186.5 lb

## 2012-06-11 DIAGNOSIS — G894 Chronic pain syndrome: Secondary | ICD-10-CM | POA: Insufficient documentation

## 2012-06-11 MED ORDER — HYDROCODONE-ACETAMINOPHEN 5-500 MG PO TABS: 1.0000 | ORAL_TABLET | Freq: Three times a day (TID) | ORAL | Status: AC | PRN

## 2012-06-11 NOTE — Progress Notes (Signed)
Subjective:    Patient ID: Kristina Mccoy, female    DOB: 15-Apr-1956, 56 y.o.   MRN: [id number]  HPI 56 year old female with history of diabetes and chronic pain presents for followup. She reports continued ongoing fatigue and chronic pain "in her bones "which keeps her from sleeping. She has been taking meloxicam with no improvement. In the past, we tried using Neurontin and Lyrica but she was unable to tolerate these medications. She reports waking frequently during the night because of pain. Pain is mostly located in the long bones such as in her forearms and femurs. She denies any weakness in her arms or legs. She denies any focal neurologic symptoms. She denies joint swelling.  Outpatient Encounter Prescriptions as of 06/11/2012  Medication Sig Dispense Refill  . ALPRAZolam (XANAX) 0.5 MG tablet Take 1 tablet (0.5 mg total) by mouth 2 (two) times daily as needed.  180 tablet  3  . ciprofloxacin (CIPRO) 500 MG tablet Take 1 tablet (500 mg total) by mouth 2 (two) times daily as needed.  180 tablet  3  . ciprofloxacin (CIPRO) 500 MG tablet Take 1 tablet (500 mg total) by mouth 2 (two) times daily as needed.  14 tablet  0  . glyBURIDE (DIABETA) 5 MG tablet TAKE 1 TABLET BY MOUTH TWICE DAILY  60 tablet  5  . glyBURIDE (DIABETA) 5 MG tablet Take 1 tablet (5 mg total) by mouth 2 (two) times daily with a meal.  180 tablet  3  . hyoscyamine (LEVSIN, ANASPAZ) 0.125 MG tablet Take 0.125 mg by mouth every 4 (four) hours as needed.      . meloxicam (MOBIC) 15 MG tablet Take 1 tablet (15 mg total) by mouth daily as needed.  30 tablet  2  . nystatin cream (MYCOSTATIN) Apply 1 application topically 2 (two) times daily.  30 g  0  . ranitidine (ZANTAC) 300 MG tablet Take 1 tablet (300 mg total) by mouth daily.  90 tablet  3  . DISCONTD: hyoscyamine (LEVSIN, ANASPAZ) 0.125 MG tablet Take 1 tablet (0.125 mg total) by mouth every 4 (four) hours as needed for cramping.  30 tablet  3  . HYDROcodone-acetaminophen  (VICODIN) 5-500 MG per tablet Take 1 tablet by mouth every 8 (eight) hours as needed for pain.  90 tablet  2   BP 120/80  Pulse 78  Temp 98.3 F (36.8 C) (Oral)  Ht 5\' 4"  (1.626 m)  Wt 186 lb 8 oz (84.596 kg)  BMI 32.01 kg/m2  SpO2 97%  Review of Systems  Constitutional: Positive for fatigue. Negative for fever, chills, appetite change and unexpected weight change.  HENT: Negative for ear pain, congestion, sore throat, trouble swallowing, neck pain, voice change and sinus pressure.   Eyes: Negative for visual disturbance.  Respiratory: Negative for cough, shortness of breath, wheezing and stridor.   Cardiovascular: Negative for chest pain, palpitations and leg swelling.  Gastrointestinal: Negative for nausea, vomiting, abdominal pain, diarrhea, constipation, blood in stool, abdominal distention and anal bleeding.  Genitourinary: Negative for dysuria and flank pain.  Musculoskeletal: Positive for myalgias, back pain and arthralgias. Negative for gait problem.  Skin: Negative for color change and rash.  Neurological: Negative for dizziness and headaches.  Hematological: Negative for adenopathy. Does not bruise/bleed easily.  Psychiatric/Behavioral: Positive for disturbed wake/sleep cycle. Negative for suicidal ideas and dysphoric mood. The patient is not nervous/anxious.        Objective:   Physical Exam  Constitutional: She is oriented to person,  place, and time. She appears well-developed and well-nourished. No distress.  HENT:  Head: Normocephalic and atraumatic.  Right Ear: External ear normal.  Left Ear: External ear normal.  Nose: Nose normal.  Mouth/Throat: Oropharynx is clear and moist. No oropharyngeal exudate.  Eyes: Conjunctivae normal are normal. Pupils are equal, round, and reactive to light. Right eye exhibits no discharge. Left eye exhibits no discharge. No scleral icterus.  Neck: Normal range of motion. Neck supple. No tracheal deviation present. No thyromegaly  present.  Cardiovascular: Normal rate, regular rhythm, normal heart sounds and intact distal pulses.  Exam reveals no gallop and no friction rub.   No murmur heard. Pulmonary/Chest: Effort normal and breath sounds normal. No respiratory distress. She has no wheezes. She has no rales. She exhibits no tenderness.  Musculoskeletal: Normal range of motion. She exhibits no edema and no tenderness.  Lymphadenopathy:    She has no cervical adenopathy.  Neurological: She is alert and oriented to person, place, and time. No cranial nerve deficit. She exhibits normal muscle tone. Coordination normal.  Skin: Skin is warm and dry. No rash noted. She is not diaphoretic. No erythema. No pallor.  Psychiatric: She has a normal mood and affect. Her behavior is normal. Judgment and thought content normal.          Assessment & Plan:

## 2012-06-11 NOTE — Assessment & Plan Note (Signed)
Symptoms are poorly controlled and limiting patient's ability to function and to sleep. She has not been able to tolerate Neurontin or Lyrica. Will try adding Vicodin at night to help with pain. She has evaluation scheduled with rheumatology for later this month. Suspect symptoms are related to fibromyalgia however question if there may be underlying inflammatory disorder that we are missing.

## 2012-06-30 ENCOUNTER — Ambulatory Visit: Payer: Self-pay | Admitting: Internal Medicine

## 2012-08-17 ENCOUNTER — Ambulatory Visit: Payer: BC Managed Care – PPO | Admitting: Internal Medicine

## 2012-09-09 ENCOUNTER — Ambulatory Visit: Payer: BC Managed Care – PPO | Admitting: Internal Medicine

## 2012-11-14 ENCOUNTER — Other Ambulatory Visit: Payer: Self-pay

## 2012-11-26 ENCOUNTER — Telehealth: Payer: Self-pay | Admitting: Internal Medicine

## 2012-11-26 ENCOUNTER — Ambulatory Visit: Payer: BC Managed Care – PPO | Admitting: Internal Medicine

## 2012-11-26 NOTE — Telephone Encounter (Signed)
would like to see Dr. Dan Humphreys in regards to my lab results regarding Dr. 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  I am having headaches and rapid heart beat at times along with some tightness in chest. mychart message.   The first appointment i could make for follow up was 3/28.  Pt will fax over lab resuts tomorrow 2/28.  Pt refused to talk to triage nurse.  Please advise if you want me to get patient in sooner. i made her appointment for today 2/27 @ 2:15  Pt didn't not see my chart message or get voice mail until after she got off work

## 2012-11-26 NOTE — Telephone Encounter (Signed)
We can try to work her in next week if any openings. preferred

## 2012-11-27 ENCOUNTER — Telehealth: Payer: Self-pay | Admitting: Internal Medicine

## 2012-11-27 NOTE — Telephone Encounter (Signed)
Rapid heart beat off and on she also is having headaches with them.  She is concerned about her last lab work done. She doesn't want to go to urgent care since they don't have her records. She will go to the ER if she feels like something is wrong. She states right now she isn't having any symptoms.  I did offer her Saturday clinic and she said that if she gets scared or the symptoms get any worse she will go to the ER.  She plans on seeing you on Tuesday, she really wants to discuss her latest lab work with you. She said that the rheumatologist that we set her up with did her last labs.

## 2012-11-27 NOTE — Telephone Encounter (Signed)
Patient Information:  Caller Name: Darlyne Schmiesing  Phone: [phone]  Patient: Dwaine Deter  Gender: Female  DOB: [birth year]  Age: 57 Years  PCP: Ronna Polio (Adults only)  Pregnant: No  Office Follow Up:  Does the office need to follow up with this patient?: Yes  Instructions For The Office: Staff is going to talk to Dr.Walker and call patient back for assistance.  RN Note:  spoke with the office robin/carolyn. They are going to discuss with Dr.Walker and will contact the patient back. Patient provided care instructions and to stay by the phone for follow up with the office. understanding expressed.  Symptoms  Reason For Call & Symptoms: patient states she is feeling rapid heart rate on/off x1 week.  She has occasional tightness in her chest.  +smoker <1 ppd, +HTN, + cholesterol  Reviewed Health History In EMR: Yes  Reviewed Medications In EMR: Yes  Reviewed Allergies In EMR: Yes  Reviewed Surgeries / Procedures: No  Date of Onset of Symptoms: 11/20/2012 OB / GYN:  LMP: Unknown  Guideline(s) Used:  Chest Pain  Disposition Per Guideline:   Go to ED Now  Reason For Disposition Reached:   Heart beating irregularly or very rapidly  Advice Given:  Call Back If:  Severe chest pain  Constant chest pain lasting longer than 5 minutes  Difficulty breathing  Fever  You become worse.

## 2012-11-27 NOTE — Telephone Encounter (Signed)
error 

## 2012-11-27 NOTE — Telephone Encounter (Signed)
Made appointment for 12/01/12 30 min . Pt aware of appointment  Pt stated her heart was racing again last night and when i called her.  I transferred her over to the traige nurse.  Pt also faxed labs from [facility] (in box up front)

## 2012-11-27 NOTE — Telephone Encounter (Signed)
Kristina Mccoy - we tried to work this pt in with Raquel, but schedule was too full. Can you please call her back? We had tried to work her in yesterday as well, but she did not receive the message. Perhaps we can work her in at Saturday clinic?

## 2012-11-27 NOTE — Telephone Encounter (Signed)
Patient informed to go to UC to be evaluated and she agreed. Also stated she has an appointment next week.

## 2012-11-27 NOTE — Telephone Encounter (Signed)
Pt is calling and is very upset because this morning she says that nurses were telling her to stay by the phone and they were going to try to get here in today to be seen but then she received a call 3 hours later saying that she needed to go to urgent care. She was very upset that she was told to sit by the phone and was at first and told this was an urgent matter but then was called  and told to go to urgent care. She is not going to urgent care and she was very addiment  wanted to me let you know.

## 2012-11-27 NOTE — Telephone Encounter (Signed)
Raquel, can you please see this patient in the 3:00 opening you have available, if so please have Asher Muir call her back to schedule. Thank you

## 2012-11-27 NOTE — Telephone Encounter (Signed)
Okey Regal, I have a new patient right before and it usually takes me 45 min to 1 hour. I won't be able to see her at 3:00pm.  Raquel

## 2012-11-30 ENCOUNTER — Telehealth: Payer: Self-pay | Admitting: *Deleted

## 2012-11-30 NOTE — Telephone Encounter (Signed)
Refill Request  Hydrocodone/[facility] 5/325  #90  Take 1 tablet by mouth every 8 hours as needed for pain

## 2012-11-30 NOTE — Telephone Encounter (Signed)
This medication was called in to pharmacy, will update chart tomorrow.

## 2012-11-30 NOTE — Telephone Encounter (Signed)
Fine to fill. 

## 2012-12-01 ENCOUNTER — Ambulatory Visit (INDEPENDENT_AMBULATORY_CARE_PROVIDER_SITE_OTHER): Payer: BC Managed Care – PPO | Admitting: Internal Medicine

## 2012-12-01 ENCOUNTER — Encounter: Payer: Self-pay | Admitting: Internal Medicine

## 2012-12-01 VITALS — BP 128/78 | HR 74 | Temp 97.7°F | Wt 188.0 lb

## 2012-12-01 DIAGNOSIS — F411 Generalized anxiety disorder: Secondary | ICD-10-CM | POA: Insufficient documentation

## 2012-12-01 DIAGNOSIS — I70219 Atherosclerosis of native arteries of extremities with intermittent claudication, unspecified extremity: Secondary | ICD-10-CM | POA: Insufficient documentation

## 2012-12-01 DIAGNOSIS — E785 Hyperlipidemia, unspecified: Secondary | ICD-10-CM | POA: Insufficient documentation

## 2012-12-01 DIAGNOSIS — R002 Palpitations: Secondary | ICD-10-CM | POA: Insufficient documentation

## 2012-12-01 DIAGNOSIS — D689 Coagulation defect, unspecified: Secondary | ICD-10-CM | POA: Insufficient documentation

## 2012-12-01 DIAGNOSIS — E782 Mixed hyperlipidemia: Secondary | ICD-10-CM | POA: Insufficient documentation

## 2012-12-01 DIAGNOSIS — E119 Type 2 diabetes mellitus without complications: Secondary | ICD-10-CM | POA: Insufficient documentation

## 2012-12-01 MED ORDER — GLYBURIDE 5 MG PO TABS: 5.0000 mg | ORAL_TABLET | Freq: Two times a day (BID) | ORAL | Status: DC

## 2012-12-01 MED ORDER — ALPRAZOLAM 0.5 MG PO TABS: 0.5000 mg | ORAL_TABLET | Freq: Two times a day (BID) | ORAL | Status: DC | PRN

## 2012-12-01 MED ORDER — SERTRALINE HCL 25 MG PO TABS: 25.0000 mg | ORAL_TABLET | Freq: Every day | ORAL | Status: DC

## 2012-12-01 MED ORDER — HYDROCODONE-ACETAMINOPHEN 5-325 MG PO TABS: 1.0000 | ORAL_TABLET | Freq: Three times a day (TID) | ORAL | Status: DC | PRN

## 2012-12-01 NOTE — Assessment & Plan Note (Signed)
Pt notes increased anxiety recently. Some improvement with xanax. Will start Sertraline to help better control symptoms. Follow up 1 month and prn.

## 2012-12-01 NOTE — Telephone Encounter (Signed)
Chart updated at patient visit today

## 2012-12-01 NOTE — Telephone Encounter (Signed)
Patient was seen today in office by Dr. Dan Humphreys.

## 2012-12-01 NOTE — Assessment & Plan Note (Signed)
Pt notes intermittent symptoms of calf pain when walking. Question if this is related to claudication. Will set up Vascular evaluation with ABIs and arterial dopplers.

## 2012-12-01 NOTE — Assessment & Plan Note (Signed)
Recent testing performed by rheumatologist show slightly elevated levels of anti-cardiolipin IgM. Pt reports she was told that she has increased risk of blood clot formation and should take daily aspirin. Recommended that we set up evaluation with expert in coagulopathies to determine if any anti-coagulation necessary. Will set up eval at [facility].

## 2012-12-01 NOTE — Assessment & Plan Note (Signed)
Pt reports BG well controlled. Will check A1c with labs today. Continue Glyburide.

## 2012-12-01 NOTE — Assessment & Plan Note (Signed)
Symptoms of palpitations and chest tightness most likely related to anxiety. EKG normal today. Given persistence of symptoms, however, question if event monitor might be helpful. Pt also has significant risk factors for CAD including smoking history, DM. Had stress test in the past which was normal. Question if it might be helpful to repeat stress test for further risk eval.

## 2012-12-01 NOTE — Progress Notes (Signed)
Subjective:    Patient ID: Kristina Mccoy, female    DOB: September 21, 1956, 57 y.o.   MRN: [id number]  HPI 56YO female with h/o fibromyalgia presents for follow up after recent rheumatology evaluation.  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 Reports she was told that because anti-cardiolipin antibodies slightly elevated, she is at risk for blood clots and should take a daily aspirin.  Continues to have diffuse muscular and joint pain. Pain is improved with hydrocodone, which she takes only at bedtime because this makes her sleepy.  In regards to DM, notes BG well controlled. Did not bring record of BG today. Compliant with medications.  Notes significant increase in anxiety recently related to work situation. Has had some episodes over the last month with rapid palpitations and chest tightness. These episodes resolve after a few seconds with rest. No diaphoresis or nausea. Had stress test in past, which she reports was normal. Using xanax at night to help with anxiety with some improvement, however notes drowsiness with this med.  Outpatient Encounter Prescriptions as of 12/01/2012  Medication Sig Dispense Refill  . ALPRAZolam (XANAX) 0.5 MG tablet Take 1 tablet (0.5 mg total) by mouth 2 (two) times daily as needed.  180 tablet  3  . glyBURIDE (DIABETA) 5 MG tablet Take 1 tablet (5 mg total) by mouth 2 (two) times daily with a meal.  180 tablet  3  . HYDROcodone-acetaminophen (NORCO/VICODIN) 5-325 MG per tablet Take 1 tablet by mouth every 8 (eight) hours as needed for pain.  90 tablet  3  . hyoscyamine (LEVSIN, ANASPAZ) 0.125 MG tablet Take 0.125 mg by mouth every 4 (four) hours as needed.      . ranitidine (ZANTAC) 300 MG tablet Take 1 tablet (300 mg total) by mouth daily.  90 tablet  3  . Vitamin D, Ergocalciferol, (DRISDOL) 50000 UNITS CAPS       . meloxicam (MOBIC) 15 MG tablet Take 1 tablet (15 mg total) by mouth daily as needed.  30 tablet  2  . nystatin cream (MYCOSTATIN) Apply 1  application topically 2 (two) times daily.  30 g  0   No facility-administered encounter medications on file as of 12/01/2012.   BP 128/78  Pulse 74  Temp(Src) 97.7 F (36.5 C) (Oral)  Wt 188 lb (85.276 kg)  BMI 32.25 kg/m2  SpO2 97%  Review of Systems  Constitutional: Positive for fatigue. Negative for fever, chills, appetite change and unexpected weight change.  HENT: Negative for ear pain, congestion, sore throat, trouble swallowing, neck pain, voice change and sinus pressure.   Eyes: Negative for visual disturbance.  Respiratory: Negative for cough, shortness of breath, wheezing and stridor.   Cardiovascular: Positive for chest pain and palpitations. Negative for leg swelling.  Gastrointestinal: Negative for nausea, vomiting, abdominal pain, diarrhea, constipation, blood in stool, abdominal distention and anal bleeding.  Genitourinary: Negative for dysuria and flank pain.  Musculoskeletal: Positive for myalgias and arthralgias. Negative for gait problem.  Skin: Negative for color change and rash.  Neurological: Negative for dizziness and headaches.  Hematological: Negative for adenopathy. Does not bruise/bleed easily.  Psychiatric/Behavioral: Negative for suicidal ideas, sleep disturbance and dysphoric mood. The patient is nervous/anxious.        Objective:   Physical Exam  Constitutional: She is oriented to person, place, and time. She appears well-developed and well-nourished. No distress.  HENT:  Head: Normocephalic and atraumatic.  Right Ear: External ear normal.  Left Ear: External ear normal.  Nose: Nose  normal.  Mouth/Throat: Oropharynx is clear and moist. No oropharyngeal exudate.  Eyes: Conjunctivae are normal. Pupils are equal, round, and reactive to light. Right eye exhibits no discharge. Left eye exhibits no discharge. No scleral icterus.  Neck: Normal range of motion. Neck supple. No tracheal deviation present. No thyromegaly present.  Cardiovascular: Normal  rate, regular rhythm, normal heart sounds and intact distal pulses.  Exam reveals no gallop and no friction rub.   No murmur heard. Pulmonary/Chest: Effort normal and breath sounds normal. No respiratory distress. She has no wheezes. She has no rales. She exhibits no tenderness.  Musculoskeletal: Normal range of motion. She exhibits no edema and no tenderness.       Right lower leg: She exhibits no tenderness and no edema.       Left lower leg: She exhibits no tenderness and no edema.  Lymphadenopathy:    She has no cervical adenopathy.  Neurological: She is alert and oriented to person, place, and time. No cranial nerve deficit. She exhibits normal muscle tone. Coordination normal.  Skin: Skin is warm and dry. No rash noted. She is not diaphoretic. No erythema. No pallor.  Psychiatric: Her behavior is normal. Judgment and thought content normal. Her mood appears anxious.          Assessment & Plan:

## 2012-12-02 ENCOUNTER — Ambulatory Visit: Payer: BC Managed Care – PPO | Admitting: Cardiovascular Disease

## 2012-12-14 ENCOUNTER — Ambulatory Visit: Payer: BC Managed Care – PPO | Admitting: Cardiovascular Disease

## 2012-12-18 ENCOUNTER — Encounter: Payer: Self-pay | Admitting: Rheumatology

## 2012-12-25 ENCOUNTER — Ambulatory Visit: Payer: BC Managed Care – PPO | Admitting: Internal Medicine

## 2013-01-04 ENCOUNTER — Ambulatory Visit: Payer: BC Managed Care – PPO | Admitting: Cardiovascular Disease

## 2013-01-11 ENCOUNTER — Telehealth: Payer: Self-pay | Admitting: Internal Medicine

## 2013-01-11 ENCOUNTER — Ambulatory Visit: Payer: BC Managed Care – PPO | Admitting: Internal Medicine

## 2013-01-11 NOTE — Telephone Encounter (Signed)
Labs from 01/07/2013 show normal blood counts, normal kidney function, normal liver function, slightly elevated cholesterol with LDL of 124. Hemoglobin A1c was 7.9%.

## 2013-01-14 ENCOUNTER — Encounter: Payer: Self-pay | Admitting: General Practice

## 2013-01-28 ENCOUNTER — Encounter: Payer: Self-pay | Admitting: Internal Medicine

## 2013-01-28 DIAGNOSIS — C50919 Malignant neoplasm of unspecified site of unspecified female breast: Secondary | ICD-10-CM

## 2013-01-28 HISTORY — DX: Malignant neoplasm of unspecified site of unspecified female breast: C50.919

## 2013-01-30 LAB — HM MAMMOGRAPHY: HM Mammogram: ABNORMAL

## 2013-02-01 ENCOUNTER — Telehealth: Payer: Self-pay | Admitting: Internal Medicine

## 2013-02-01 DIAGNOSIS — Z1239 Encounter for other screening for malignant neoplasm of breast: Secondary | ICD-10-CM

## 2013-02-01 NOTE — Telephone Encounter (Signed)
Read below

## 2013-02-01 NOTE — Telephone Encounter (Signed)
Amber, There is an order in for a mammogram. Can you set this up?

## 2013-02-01 NOTE — Telephone Encounter (Signed)
This order is expiring before they can get her scheduled. Has she not had one since 2012?

## 2013-02-01 NOTE — Telephone Encounter (Signed)
I have written a new order to schedule.

## 2013-02-01 NOTE — Telephone Encounter (Signed)
[facility] Pt called needed to get a referral for mammogram at [facility]

## 2013-02-04 ENCOUNTER — Ambulatory Visit: Payer: Self-pay | Admitting: Internal Medicine

## 2013-02-05 ENCOUNTER — Telehealth: Payer: Self-pay | Admitting: Internal Medicine

## 2013-02-05 NOTE — Telephone Encounter (Signed)
Mammogram showed asymmetry that requires additional views. Has this been scheduled?

## 2013-02-09 NOTE — Telephone Encounter (Signed)
Spoke with patient and no this has not been scheduled.

## 2013-02-09 NOTE — Telephone Encounter (Signed)
Amber, Can you make sure this is scheduled?

## 2013-02-10 NOTE — Telephone Encounter (Signed)
I spoke with Kristina Mccoy @ [facility] who coordinates the additional views with the radiologist who read it is working on getting her scheduled.

## 2013-02-15 ENCOUNTER — Ambulatory Visit: Payer: Self-pay | Admitting: Internal Medicine

## 2013-02-15 ENCOUNTER — Encounter: Payer: Self-pay | Admitting: Internal Medicine

## 2013-02-15 ENCOUNTER — Telehealth: Payer: Self-pay | Admitting: Internal Medicine

## 2013-02-15 NOTE — Telephone Encounter (Signed)
Attempted to call pt to review results from recent mammogram 5/19 showing mass in right breast measuring 2x1.8x1.1cm. Left message for pt to call me back at the office.  Okey Regal - If she calls back we need to see if results of mammogram were discussed with her at the time of the study. We need to set up surgical consultation for biopsy.

## 2013-02-15 NOTE — Telephone Encounter (Signed)
Spoke with pt. Explained mammogram and [place] results. She would prefer to think about which surgeon she would like referral to and will email [place] or call us with answer.

## 2013-02-15 NOTE — Telephone Encounter (Signed)
Spoke with patient and she stated she would like to know who you are referring her to for the biopsy. Would like for you to call her back to discuss further.

## 2013-02-15 NOTE — Telephone Encounter (Signed)
Pt returned you called  [phone]

## 2013-02-17 ENCOUNTER — Telehealth: Payer: Self-pay | Admitting: Internal Medicine

## 2013-02-17 NOTE — Telephone Encounter (Signed)
The patient is aware of her appointment with Dr. Marti Sleigh on 5.22.14 . She will bring her images of her breast to this appointment. Notes have been faxed to [phone]. Phone # [phone].

## 2013-02-17 NOTE — Telephone Encounter (Signed)
Checking on her referral to surgeon.  Abnormal mammogram.  Pt would really like to have appointment ASAP

## 2013-02-24 DIAGNOSIS — C50811 Malignant neoplasm of overlapping sites of right female breast: Secondary | ICD-10-CM | POA: Insufficient documentation

## 2013-02-28 DIAGNOSIS — C50911 Malignant neoplasm of unspecified site of right female breast: Secondary | ICD-10-CM | POA: Insufficient documentation

## 2013-02-28 DIAGNOSIS — C50919 Malignant neoplasm of unspecified site of unspecified female breast: Secondary | ICD-10-CM | POA: Insufficient documentation

## 2013-03-03 ENCOUNTER — Encounter: Payer: Self-pay | Admitting: Internal Medicine

## 2013-03-04 ENCOUNTER — Encounter: Payer: Self-pay | Admitting: Internal Medicine

## 2013-03-23 HISTORY — PX: MASTECTOMY: SHX3

## 2013-05-28 ENCOUNTER — Ambulatory Visit: Payer: Self-pay | Admitting: Emergency Medicine

## 2013-06-01 ENCOUNTER — Other Ambulatory Visit: Payer: Self-pay | Admitting: *Deleted

## 2013-06-01 MED ORDER — HYOSCYAMINE SULFATE 0.125 MG PO TABS: 0.1250 mg | ORAL_TABLET | ORAL | Status: DC | PRN

## 2013-06-01 NOTE — Telephone Encounter (Signed)
Eprescribed.

## 2013-06-21 ENCOUNTER — Telehealth: Payer: Self-pay | Admitting: *Deleted

## 2013-06-21 MED ORDER — GLUCOSE BLOOD VI STRP: ORAL_STRIP | Status: DC

## 2013-06-21 MED ORDER — FREESTYLE LANCETS MISC: Status: DC

## 2013-06-21 NOTE — Telephone Encounter (Signed)
Refill Request  Freestyle lite test   #60  Check blood sugar two times per day

## 2013-06-21 NOTE — Telephone Encounter (Signed)
Refill Request  Freestyle lancets  #100  Use twice a day

## 2013-07-01 ENCOUNTER — Other Ambulatory Visit: Payer: Self-pay | Admitting: *Deleted

## 2013-07-01 DIAGNOSIS — F411 Generalized anxiety disorder: Secondary | ICD-10-CM

## 2013-07-01 NOTE — Telephone Encounter (Signed)
Pt calling stating after talking with the pharmacy ([facility]) her hydrocodone and xanax are both expired. She is wanting [place] to re-do a script for she can get these filed. Please advise.

## 2013-07-02 LAB — HM PAP SMEAR: HM Pap smear: NORMAL

## 2013-07-02 NOTE — Telephone Encounter (Signed)
Fine to fill, however we can no longer call in Hydrocodone. She will need to pick up Rx and give UDS with signed narcotic contract.

## 2013-07-05 ENCOUNTER — Encounter: Payer: Self-pay | Admitting: Adult Health

## 2013-07-05 ENCOUNTER — Ambulatory Visit (INDEPENDENT_AMBULATORY_CARE_PROVIDER_SITE_OTHER): Payer: BC Managed Care – PPO | Admitting: Adult Health

## 2013-07-05 VITALS — BP 118/62 | HR 84 | Temp 98.2°F | Resp 12 | Wt 182.0 lb

## 2013-07-05 DIAGNOSIS — IMO0001 Reserved for inherently not codable concepts without codable children: Secondary | ICD-10-CM

## 2013-07-05 DIAGNOSIS — B3731 Acute candidiasis of vulva and vagina: Secondary | ICD-10-CM

## 2013-07-05 DIAGNOSIS — F411 Generalized anxiety disorder: Secondary | ICD-10-CM

## 2013-07-05 DIAGNOSIS — IMO0002 Reserved for concepts with insufficient information to code with codable children: Secondary | ICD-10-CM

## 2013-07-05 DIAGNOSIS — E1165 Type 2 diabetes mellitus with hyperglycemia: Secondary | ICD-10-CM

## 2013-07-05 DIAGNOSIS — E119 Type 2 diabetes mellitus without complications: Secondary | ICD-10-CM

## 2013-07-05 DIAGNOSIS — B373 Candidiasis of vulva and vagina: Secondary | ICD-10-CM

## 2013-07-05 DIAGNOSIS — F419 Anxiety disorder, unspecified: Secondary | ICD-10-CM

## 2013-07-05 MED ORDER — INSULIN DETEMIR 100 UNIT/ML FLEXPEN: 10.0000 [IU] | PEN_INJECTOR | Freq: Every morning | SUBCUTANEOUS | Status: DC

## 2013-07-05 MED ORDER — NYSTATIN-TRIAMCINOLONE 100000-0.1 UNIT/GM-% EX CREA: TOPICAL_CREAM | Freq: Two times a day (BID) | CUTANEOUS | Status: DC

## 2013-07-05 MED ORDER — ALPRAZOLAM 0.5 MG PO TABS: 0.5000 mg | ORAL_TABLET | Freq: Three times a day (TID) | ORAL | Status: DC | PRN

## 2013-07-05 MED ORDER — FLUCONAZOLE 150 MG PO TABS: 150.0000 mg | ORAL_TABLET | Freq: Once | ORAL | Status: DC

## 2013-07-05 MED ORDER — GLYBURIDE 5 MG PO TABS: ORAL_TABLET | ORAL | Status: DC

## 2013-07-05 NOTE — Progress Notes (Signed)
  Subjective:    Patient ID: Kristina Mccoy. [place], female    DOB: March 04, 1956, 57 y.o.   MRN: [id number]  HPI  Pt is a pleasant 57 year old female who is currently undergoing treatment for right breast cancer, status post bilateral mastectomy on 03/23/2013. Reports increased blood sugar readings in the 300s and even 400s as well as increased recent Hgb A1c (9%) since starting chemo and decadron.  Pt also reports vaginal burning and itching, was recently treated for yeast infection with Diflucan x 1.  She also reports increased anxiety as a result of Decadron and chemotherapy. Otherwise, patient is feeling well. She is tolerating her treatment well.   Current Outpatient Prescriptions on File Prior to Visit  Medication Sig Dispense Refill  . glucose blood (FREESTYLE LITE) test strip Use as instructed  100 each  0  . HYDROcodone-acetaminophen (NORCO/VICODIN) 5-325 MG per tablet Take 1 tablet by mouth every 8 (eight) hours as needed for pain.  90 tablet  3  . ranitidine (ZANTAC) 300 MG tablet Take 1 tablet (300 mg total) by mouth daily.  90 tablet  3  . hyoscyamine (LEVSIN, ANASPAZ) 0.125 MG tablet Take 1 tablet (0.125 mg total) by mouth every 4 (four) hours as needed.  30 tablet  0  . Lancets (FREESTYLE) lancets Use as instructed  100 each  0  . nystatin cream (MYCOSTATIN) Apply 1 application topically 2 (two) times daily.  30 g  0   No current facility-administered medications on file prior to visit.     Review of Systems  Constitutional: Negative for fever, activity change, fatigue and unexpected weight change.  HENT: Negative.   Eyes: Negative.   Respiratory: Negative.  Negative for cough, shortness of breath and wheezing.   Cardiovascular: Negative.  Negative for chest pain.  Gastrointestinal: Negative.  Negative for nausea, vomiting, abdominal pain, diarrhea and constipation.  Endocrine: Negative.  Negative for cold intolerance, heat intolerance, polydipsia, polyphagia and polyuria.   Genitourinary:       Vaginal burning and itching to surrounding area.  Musculoskeletal: Negative.   Skin: Negative.   Allergic/Immunologic: Negative.   Neurological: Negative.  Negative for dizziness, light-headedness and headaches.  Hematological: Negative.   Psychiatric/Behavioral: Positive for sleep disturbance. Negative for behavioral problems, confusion and agitation. The patient is nervous/anxious.        Sleep alteration 2/2 decadron       Objective:   Physical Exam  Constitutional: She is oriented to person, place, and time. She appears well-developed and well-nourished. No distress.  HENT:  Right Ear: Tympanic membrane and ear canal normal.  Left Ear: Tympanic membrane and ear canal normal.  Cardiovascular: Normal rate, regular rhythm and normal heart sounds.   Pulmonary/Chest: Effort normal and breath sounds normal. No respiratory distress. She has no wheezes. She has no rales.  Musculoskeletal: Normal range of motion.  Neurological: She is alert and oriented to person, place, and time.  Skin: Skin is warm and dry.  Psychiatric: She has a normal mood and affect. Her behavior is normal. Judgment and thought content normal.    BP 118/62  Pulse 84  Temp(Src) 98.2 F (36.8 C) (Oral)  Resp 12  Wt 182 lb (82.555 kg)  BMI 31.22 kg/m2  SpO2 98%       Assessment & Plan:

## 2013-07-05 NOTE — Patient Instructions (Addendum)
  Continue with Glyburide as you have been doing.  Start Levemir Insulin in the morning after breakfast. You will inject 10 units under the skin. You may use your abdomen, your thighs or the fatty portion of the back of your arms.  Continue to monitor your blood glucose and write it down in your log. Note the times when you check your blood glucose.  I have given you are prescription for diflucan. Take 1 tablet today.  Mycolog cream to the affected area twice a day for 5-7 days.

## 2013-07-06 DIAGNOSIS — F419 Anxiety disorder, unspecified: Secondary | ICD-10-CM | POA: Insufficient documentation

## 2013-07-06 DIAGNOSIS — E1165 Type 2 diabetes mellitus with hyperglycemia: Secondary | ICD-10-CM | POA: Insufficient documentation

## 2013-07-06 DIAGNOSIS — IMO0002 Reserved for concepts with insufficient information to code with codable children: Secondary | ICD-10-CM | POA: Insufficient documentation

## 2013-07-06 DIAGNOSIS — E119 Type 2 diabetes mellitus without complications: Secondary | ICD-10-CM | POA: Insufficient documentation

## 2013-07-06 DIAGNOSIS — B3731 Acute candidiasis of vulva and vagina: Secondary | ICD-10-CM | POA: Insufficient documentation

## 2013-07-06 DIAGNOSIS — B373 Candidiasis of vulva and vagina: Secondary | ICD-10-CM | POA: Insufficient documentation

## 2013-07-06 DIAGNOSIS — E109 Type 1 diabetes mellitus without complications: Secondary | ICD-10-CM | POA: Insufficient documentation

## 2013-07-06 NOTE — Assessment & Plan Note (Signed)
Diflucan 150 mg times one. Mycolog to affected area twice a day. RTC if symptoms are not improved within 5-7 days.

## 2013-07-06 NOTE — Assessment & Plan Note (Addendum)
Suspect that this is related to Decadron. She has been taking glyburide 5 mg 3-4 times a day to try to manage her blood glucose. Recent hemoglobin A1c is 9% and she reports her blood glucose levels have been in the 300s to 400s. Add insulin Levemir 10 units in the morning. If sugars are still not controlled will add a dose in the evening. Provided patient with a log to keep track of blood glucose levels and bring with her on her next appointment. Patient is to followup in one week with PCP. Note, greater than 40 minutes were spent in face-to-face communication with patient in the education related to the use of insulin, assessment, planning and implementation of care pertaining to this problem and others listed during this visit.

## 2013-07-06 NOTE — Assessment & Plan Note (Signed)
Increasing anxiety since beginning chemotherapy. She will also be undergoing radiation. She has been taking Xanax twice a day and is wondering if we could increase her dosage or frequency. Increase Xanax to 3 times a day. Will wean back to twice a day once she is finished with her treatment for breast cancer.

## 2013-07-09 ENCOUNTER — Telehealth: Payer: Self-pay | Admitting: Internal Medicine

## 2013-07-09 ENCOUNTER — Encounter: Payer: Self-pay | Admitting: *Deleted

## 2013-07-09 MED ORDER — GLUCOSE BLOOD VI STRP: ORAL_STRIP | Status: DC

## 2013-07-09 NOTE — Telephone Encounter (Signed)
Seen by Raquel on 10/6 and has appt 10/13 to see Dr. Dan Humphreys.  Freestyle Lite test strips needed.  On chemo and is checking blood sugars 6x day.  Needs Rx changed at [facility] from 2x day to 6x day so the insurance will pay.  Needs this done asap, does not have enough strips to last through the weekend and with chemo it is critical that she checks blood sugars.

## 2013-07-09 NOTE — Telephone Encounter (Signed)
This prescription has been sent to the pharmacy, when taking these messages patient needs to be made aware prescription request need to be sent through their pharmacy or taken care during their visit to reduce delays.

## 2013-07-12 ENCOUNTER — Ambulatory Visit (INDEPENDENT_AMBULATORY_CARE_PROVIDER_SITE_OTHER): Payer: BC Managed Care – PPO | Admitting: Internal Medicine

## 2013-07-12 ENCOUNTER — Encounter: Payer: Self-pay | Admitting: Internal Medicine

## 2013-07-12 VITALS — BP 118/70 | HR 85 | Temp 98.2°F | Wt 187.0 lb

## 2013-07-12 DIAGNOSIS — F411 Generalized anxiety disorder: Secondary | ICD-10-CM

## 2013-07-12 DIAGNOSIS — IMO0002 Reserved for concepts with insufficient information to code with codable children: Secondary | ICD-10-CM

## 2013-07-12 DIAGNOSIS — E1165 Type 2 diabetes mellitus with hyperglycemia: Secondary | ICD-10-CM

## 2013-07-12 DIAGNOSIS — C50919 Malignant neoplasm of unspecified site of unspecified female breast: Secondary | ICD-10-CM

## 2013-07-12 DIAGNOSIS — IMO0001 Reserved for inherently not codable concepts without codable children: Secondary | ICD-10-CM

## 2013-07-12 MED ORDER — ALPRAZOLAM 0.5 MG PO TABS: 0.5000 mg | ORAL_TABLET | Freq: Four times a day (QID) | ORAL | Status: DC | PRN

## 2013-07-12 MED ORDER — INSULIN ASPART 100 UNIT/ML FLEXPEN: 4.0000 [IU] | PEN_INJECTOR | Freq: Three times a day (TID) | SUBCUTANEOUS | Status: DC

## 2013-07-12 MED ORDER — INSULIN DETEMIR 100 UNIT/ML FLEXPEN: 10.0000 [IU] | PEN_INJECTOR | Freq: Every morning | SUBCUTANEOUS | Status: DC

## 2013-07-12 NOTE — Patient Instructions (Signed)
Start Novolog 4units prior to meals if blood sugar is >250. Recheck blood sugar in 1 hr.  Continue Levemir 15units daily.  Email with blood sugar readings, or any questions. If any blood sugars less than 70, please let me know.

## 2013-07-12 NOTE — Progress Notes (Signed)
Subjective:    Patient ID: Kristina Mccoy, female    DOB: 1956-04-11, 57 y.o.   MRN: [id number]  HPI 57 year old female with history of diabetes, fibromyalgia, and recent diagnosis of breast cancer on chemotherapy presents for followup. She has recently been undergoing chemotherapy with Taxol and receiving Decadron. With the use of Decadron, her blood sugars have been elevated, often near 300 on the day she receives Decadron. She was recently started on Levemir 15 units daily. She has had minimal improvement with this. However, on days that she is off Decadron her blood sugar has been as low as 65.  She reports some fatigue and increase in symptoms of anxiety during chemotherapy. It has been a difficult time for her. She notes some increased neuropathic pain in her legs associated with use of Taxol. She has been told that this will resolve after chemotherapy is complete.  Outpatient Encounter Prescriptions as of 07/12/2013  Medication Sig Dispense Refill  . ALPRAZolam (XANAX) 0.5 MG tablet Take 1 tablet (0.5 mg total) by mouth 4 (four) times daily as needed for anxiety.  120 tablet  0  . DEXAMETHASONE PO Take by mouth. 10 mg day of chemo      . dicyclomine (BENTYL) 10 MG capsule Take 10 mg by mouth 4 (four) times daily -  before meals and at bedtime.      . ERYTHROMYCIN OP Apply to eye. Apply to affected eye 4 times daily      . fluconazole (DIFLUCAN) 150 MG tablet Take 1 tablet (150 mg total) by mouth once.  1 tablet  0  . glucose blood (FREESTYLE LITE) test strip Use as instructed  100 each  0  . glyBURIDE (DIABETA) 5 MG tablet Take 2 tablets daily. May take an additional 2 tablets if blood glucose above 250.  120 tablet  3  . HYDROcodone-acetaminophen (NORCO/VICODIN) 5-325 MG per tablet Take 1 tablet by mouth every 8 (eight) hours as needed for pain.  90 tablet  3  . hyoscyamine (LEVSIN, ANASPAZ) 0.125 MG tablet Take 1 tablet (0.125 mg total) by mouth every 4 (four) hours as needed.  30 tablet   0  . Insulin Detemir 100 UNIT/ML SOPN Inject 10 Units into the skin every morning.  3 pen  6  . ketorolac (ACULAR) 0.5 % ophthalmic solution 1 drop 4 (four) times daily. 1 drop in each affected eye 4 times daily      . Lancets (FREESTYLE) lancets Use as instructed  100 each  0  . LORAZEPAM PO Take by mouth. Take 2nd for nausea      . nystatin cream (MYCOSTATIN) Apply 1 application topically 2 (two) times daily.  30 g  0  . nystatin-triamcinolone (MYCOLOG II) cream Apply topically 2 (two) times daily.  30 g  0  . prochlorperazine (COMPAZINE) 10 MG tablet Take 10 mg by mouth every 6 (six) hours as needed.      . ranitidine (ZANTAC) 300 MG tablet Take 1 tablet (300 mg total) by mouth daily.  90 tablet  3  . [DISCONTINUED] ALPRAZolam (XANAX) 0.5 MG tablet Take 1 tablet (0.5 mg total) by mouth 3 (three) times daily as needed.  90 tablet  0  . [DISCONTINUED] Insulin Detemir 100 UNIT/ML SOPN Inject 10 Units into the skin every morning.  1 pen  0  . insulin aspart (NOVOLOG FLEXPEN) 100 UNIT/ML SOPN FlexPen Inject 4 Units into the skin 3 (three) times daily with meals. If blood sugar >250  3  pen  6   No facility-administered encounter medications on file as of 07/12/2013.   BP 118/70  Pulse 85  Temp(Src) 98.2 F (36.8 C) (Oral)  Wt 187 lb (84.823 kg)  BMI 32.08 kg/m2  SpO2 98%  Review of Systems  Constitutional: Negative for fever, chills, appetite change, fatigue and unexpected weight change.  HENT: Negative for congestion, ear pain, sinus pressure, sore throat, trouble swallowing and voice change.   Eyes: Negative for visual disturbance.  Respiratory: Negative for cough, shortness of breath, wheezing and stridor.   Cardiovascular: Negative for chest pain, palpitations and leg swelling.  Gastrointestinal: Negative for nausea, vomiting, abdominal pain, diarrhea, constipation, blood in stool, abdominal distention and anal bleeding.  Genitourinary: Negative for dysuria and flank pain.   Musculoskeletal: Negative for arthralgias, gait problem, myalgias and neck pain.  Skin: Negative for color change and rash.  Neurological: Negative for dizziness and headaches.  Hematological: Negative for adenopathy. Does not bruise/bleed easily.  Psychiatric/Behavioral: Negative for suicidal ideas, sleep disturbance and dysphoric mood. The patient is nervous/anxious.        Objective:   Physical Exam  Constitutional: She is oriented to person, place, and time. She appears well-developed and well-nourished. No distress.  HENT:  Head: Normocephalic and atraumatic.  Right Ear: External ear normal.  Left Ear: External ear normal.  Nose: Nose normal.  Mouth/Throat: Oropharynx is clear and moist. No oropharyngeal exudate.  Eyes: Conjunctivae are normal. Pupils are equal, round, and reactive to light. Right eye exhibits no discharge. Left eye exhibits no discharge. No scleral icterus.  Neck: Normal range of motion. Neck supple. No tracheal deviation present. No thyromegaly present.  Cardiovascular: Normal rate, regular rhythm, normal heart sounds and intact distal pulses.  Exam reveals no gallop and no friction rub.   No murmur heard. Pulmonary/Chest: Effort normal and breath sounds normal. No accessory muscle usage. Not tachypneic. No respiratory distress. She has no decreased breath sounds. She has no wheezes. She has no rhonchi. She has no rales. She exhibits no tenderness.  Musculoskeletal: Normal range of motion. She exhibits no edema and no tenderness.  Lymphadenopathy:    She has no cervical adenopathy.  Neurological: She is alert and oriented to person, place, and time. No cranial nerve deficit. She exhibits normal muscle tone. Coordination normal.  Skin: Skin is warm and dry. No rash noted. She is not diaphoretic. No erythema. No pallor.  Psychiatric: Her speech is normal and behavior is normal. Judgment and thought content normal. Her mood appears anxious. She exhibits a depressed  mood. She expresses no suicidal ideation.          Assessment & Plan:

## 2013-07-12 NOTE — Assessment & Plan Note (Signed)
Blood sugars have recently been elevated in the setting of chemotherapy and use of Decadron. Will continue Levemir 15 units daily. Will add NovoLog 4 units as needed for blood sugars greater than 250 prior to meals. Patient will call if any consistent blood sugars greater than 250 or blood sugars less than 70. Anticipate that she will be able to, off insulin after her chemotherapy completed.

## 2013-07-12 NOTE — Assessment & Plan Note (Signed)
Patient currently undergoing chemotherapy with Taxol. Experiencing some neuropathic pain. Encouraged her to use hydrocodone as needed. Offered support today. Will increase alprazolam to 0.5 mg up to 4 times daily as needed for anxiety or nausea. Followup in 2-4 weeks.

## 2013-07-12 NOTE — Assessment & Plan Note (Signed)
Recent increase in anxiety with chemotherapy. Will increase alprazolam to 0.5 mg up to 4 times daily as needed for anxiety.

## 2013-07-13 ENCOUNTER — Other Ambulatory Visit: Payer: BC Managed Care – PPO

## 2013-07-15 ENCOUNTER — Encounter: Payer: Self-pay | Admitting: Internal Medicine

## 2013-07-16 ENCOUNTER — Encounter: Payer: Self-pay | Admitting: Internal Medicine

## 2013-07-16 ENCOUNTER — Telehealth: Payer: Self-pay | Admitting: Internal Medicine

## 2013-07-16 NOTE — Telephone Encounter (Signed)
Also received a call from [place], at her hematologist office stating the PA gave her a limited amount of Oxycodone 5 mg. # 30 to help with some of the pain she was having. Just wanted to make you aware of this.

## 2013-07-16 NOTE — Telephone Encounter (Signed)
Replied to pt via email. Instructed her to take 4units of Novolog insulin, recheck blood sugar in 1 hr. She is also taking Levemir 15unit daily.

## 2013-07-16 NOTE — Telephone Encounter (Signed)
Took gliburide 5 mg this morning and Levemir 15 units, went to chemo, finished chemo at 1:30, they gave her 4 units of insulin after chemo.  Has not had a meal and blood sugar is almost 400 after chemo.  Took gliburide 5 mg again.  Does she need to take 4 more units of insulin?  What type of schedule should she use to get the sugar down.  States she has been in the past two Mondays to see Dr. Dan Humphreys and spent last Friday night in the ER.  Has another appt on 11/3.

## 2013-07-16 NOTE — Telephone Encounter (Signed)
Patient following up regarding blood sugar spikes on chemo which finished 1330.  States blood sugar is 341 after 4 units of insulin and extra 5mg  glyburide.  Decadron was reduced by chemo staff as well.  Patient is very anxious about this and wants to know if she needs a sliding scale.  States was told to use 4u levemir prior to a meal if blood sugar was > 250 before a meal.  States has done that, but blood sugar is still 341.  Last insulin was given 07/16/13 1330 was 4 units of novalog.  On recheck 07/16/13 1615, blood sugar 371.  Denies symptoms of DKA; info to office for staff/provider review/instructions/callback.  May reach patient at [phone].  krs/can

## 2013-07-19 NOTE — Telephone Encounter (Signed)
Kristina Mccoy left another message on voicemail stating Kristina Mccoy the NP did not give her the Oxycodone 5 mg. She thought she did but she never actually gave her the prescription so Oxycodone 5 mg is what she may need

## 2013-07-19 NOTE — Telephone Encounter (Signed)
That is fine. We can prescribe her pain medications here.

## 2013-07-19 NOTE — Telephone Encounter (Addendum)
Received another call from Dr. Kathi Der office in [place] at [place] where is receiving her chemo. Vernona Rieger stated patient left a voicemail on their voicemail stating the pain medication they gave her was not helping and she would like to know if you would be ok with prescribing her some pain medication since she only will be with them for a couple more weeks and they do not want her getting narcotics from several different offices. They gave her Oxycodone which is not strong enough. Per Vernona Rieger the kind of chemo she is getting will cause a lot of pain and discomfort.

## 2013-07-20 ENCOUNTER — Encounter: Payer: Self-pay | Admitting: Internal Medicine

## 2013-07-20 MED ORDER — OXYCODONE HCL 5 MG PO TABS: 5.0000 mg | ORAL_TABLET | Freq: Four times a day (QID) | ORAL | Status: DC | PRN

## 2013-07-20 NOTE — Telephone Encounter (Signed)
Pt called to clarify that Marie/ Vernona Rieger has not given the patient the pain medication. They are wanting [place] to write it due to the drive from here to [place]. Pt wondering if we are writing script for her. Please advise.

## 2013-07-20 NOTE — Telephone Encounter (Signed)
Prescription written and patient aware to pick it up.

## 2013-07-20 NOTE — Telephone Encounter (Signed)
Could you please write this script so patient will pick up

## 2013-07-30 ENCOUNTER — Encounter: Payer: Self-pay | Admitting: *Deleted

## 2013-08-02 ENCOUNTER — Ambulatory Visit (INDEPENDENT_AMBULATORY_CARE_PROVIDER_SITE_OTHER): Payer: BC Managed Care – PPO | Admitting: Internal Medicine

## 2013-08-02 ENCOUNTER — Encounter: Payer: Self-pay | Admitting: Internal Medicine

## 2013-08-02 VITALS — BP 110/60 | HR 85 | Temp 97.8°F | Wt 191.0 lb

## 2013-08-02 DIAGNOSIS — IMO0002 Reserved for concepts with insufficient information to code with codable children: Secondary | ICD-10-CM

## 2013-08-02 DIAGNOSIS — C50919 Malignant neoplasm of unspecified site of unspecified female breast: Secondary | ICD-10-CM

## 2013-08-02 DIAGNOSIS — G894 Chronic pain syndrome: Secondary | ICD-10-CM

## 2013-08-02 DIAGNOSIS — F411 Generalized anxiety disorder: Secondary | ICD-10-CM

## 2013-08-02 DIAGNOSIS — IMO0001 Reserved for inherently not codable concepts without codable children: Secondary | ICD-10-CM

## 2013-08-02 DIAGNOSIS — E1165 Type 2 diabetes mellitus with hyperglycemia: Secondary | ICD-10-CM

## 2013-08-02 NOTE — Assessment & Plan Note (Signed)
Symptoms improved with alprazolam. Will continue.

## 2013-08-02 NOTE — Progress Notes (Signed)
Subjective:    Patient ID: Kristina Mccoy, female    DOB: [birth year], 57 y.o.   MRN: [id number]  HPI 57 year old female with history of diabetes mellitus, chronic pain/fibromyalgia, and recent diagnosis of breast cancer undergoing chemotherapy presents for followup. In regards to diabetes, blood sugars have been much better controlled with use of Levemir 15 units daily and NovoLog 4 units as needed for sugars greater than 250. Patient reports blood sugars are generally well controlled except when she receives premedication with steroids for chemotherapy. She has had a couple of sugars over 300 during those times. Otherwise, most fasting sugars near 80. No low sugars less than 70.  She notes that ever since starting on Taxol her symptoms of chronic pain in her legs, arms, and trunk have been exacerbated. She has been using oxycodone 5 mg as needed only for severe pain. She has been limiting use of this medication as much is possible. She reports significant improvement in severe pain with this medication.  Outpatient Encounter Prescriptions as of 08/02/2013  Medication Sig  . ALPRAZolam (XANAX) 0.5 MG tablet Take 1 tablet (0.5 mg total) by mouth 4 (four) times daily as needed for anxiety.  Marland Kitchen DEXAMETHASONE PO Take by mouth. 10 mg day of chemo  . dicyclomine (BENTYL) 10 MG capsule Take 10 mg by mouth 4 (four) times daily -  before meals and at bedtime.  Marland Kitchen glucose blood (FREESTYLE LITE) test strip Use as instructed  . glyBURIDE (DIABETA) 5 MG tablet Take 2 tablets daily. May take an additional 2 tablets if blood glucose above 250.  Marland Kitchen HYDROcodone-acetaminophen (NORCO/VICODIN) 5-325 MG per tablet Take 1 tablet by mouth every 8 (eight) hours as needed for pain.  . hyoscyamine (LEVSIN, ANASPAZ) 0.125 MG tablet Take 1 tablet (0.125 mg total) by mouth every 4 (four) hours as needed.  . insulin aspart (NOVOLOG FLEXPEN) 100 UNIT/ML SOPN FlexPen Inject 4 Units into the skin 3 (three) times daily with meals. If  blood sugar >250  . Insulin Detemir 100 UNIT/ML SOPN Inject 10 Units into the skin every morning.  . Lancets (FREESTYLE) lancets Use as instructed  . LORAZEPAM PO Take by mouth. Take 2nd for nausea  . nystatin-triamcinolone (MYCOLOG II) cream Apply topically 2 (two) times daily.  Marland Kitchen oxyCODONE (OXY IR/ROXICODONE) 5 MG immediate release tablet Take 1 tablet (5 mg total) by mouth every 6 (six) hours as needed for pain.  Marland Kitchen prochlorperazine (COMPAZINE) 10 MG tablet Take 10 mg by mouth every 6 (six) hours as needed.  . ranitidine (ZANTAC) 300 MG tablet Take 1 tablet (300 mg total) by mouth daily.   BP 110/60  Pulse 85  Temp(Src) 97.8 F (36.6 C) (Oral)  Wt 191 lb (86.637 kg)  SpO2 97%  Review of Systems  Constitutional: Negative for fever, chills, appetite change, fatigue and unexpected weight change.  HENT: Negative for congestion, ear pain, sinus pressure, sore throat, trouble swallowing and voice change.   Eyes: Negative for visual disturbance.  Respiratory: Negative for cough, shortness of breath, wheezing and stridor.   Cardiovascular: Negative for chest pain, palpitations and leg swelling.  Gastrointestinal: Negative for nausea, vomiting, abdominal pain, diarrhea, constipation, blood in stool, abdominal distention and anal bleeding.  Genitourinary: Negative for dysuria and flank pain.  Musculoskeletal: Positive for arthralgias and myalgias. Negative for gait problem and neck pain.  Skin: Negative for color change and rash.  Neurological: Negative for dizziness and headaches.  Hematological: Negative for adenopathy. Does not bruise/bleed easily.  Psychiatric/Behavioral: Negative  for suicidal ideas, sleep disturbance and dysphoric mood. The patient is not nervous/anxious.        Objective:   Physical Exam  Constitutional: She is oriented to person, place, and time. She appears well-developed and well-nourished. No distress.  HENT:  Head: Normocephalic and atraumatic.  Right Ear:  External ear normal.  Left Ear: External ear normal.  Nose: Nose normal.  Mouth/Throat: Oropharynx is clear and moist. No oropharyngeal exudate.  Eyes: Conjunctivae are normal. Pupils are equal, round, and reactive to light. Right eye exhibits no discharge. Left eye exhibits no discharge. No scleral icterus.  Neck: Normal range of motion. Neck supple. No tracheal deviation present. No thyromegaly present.  Cardiovascular: Normal rate, regular rhythm, normal heart sounds and intact distal pulses.  Exam reveals no gallop and no friction rub.   No murmur heard. Pulmonary/Chest: Effort normal and breath sounds normal. No accessory muscle usage. Not tachypneic. No respiratory distress. She has no decreased breath sounds. She has no wheezes. She has no rhonchi. She has no rales. She exhibits no tenderness.  Musculoskeletal: Normal range of motion. She exhibits no edema and no tenderness.  Lymphadenopathy:    She has no cervical adenopathy.  Neurological: She is alert and oriented to person, place, and time. No cranial nerve deficit. She exhibits normal muscle tone. Coordination normal.  Skin: Skin is warm and dry. No rash noted. She is not diaphoretic. No erythema. No pallor.  Psychiatric: She has a normal mood and affect. Her behavior is normal. Judgment and thought content normal.          Assessment & Plan:

## 2013-08-02 NOTE — Assessment & Plan Note (Signed)
Symptoms of chronic pain exacerbated by recent chemotherapy, particularly use of Taxol. Will use oxycodone as needed for severe pain. Goal pain level <5/10. Pt will also be monitored by her oncologist.

## 2013-08-02 NOTE — Assessment & Plan Note (Signed)
Blood sugars are much improved with use of Levemir and NovoLog as needed. Will continue to monitor. Plan to recheck A1c in January 2015.

## 2013-08-05 ENCOUNTER — Other Ambulatory Visit: Payer: Self-pay

## 2013-08-06 ENCOUNTER — Telehealth: Payer: Self-pay | Admitting: Internal Medicine

## 2013-08-06 ENCOUNTER — Other Ambulatory Visit: Payer: Self-pay | Admitting: *Deleted

## 2013-08-06 MED ORDER — FREESTYLE LANCETS MISC: Status: DC

## 2013-08-06 MED ORDER — GLUCOSE BLOOD VI STRP: ORAL_STRIP | Status: DC

## 2013-08-06 NOTE — Telephone Encounter (Signed)
Rx sent to pharmacy by escript. Test strips refilled in another enounter

## 2013-08-06 NOTE — Telephone Encounter (Signed)
Pt called stating [facility] sent refill request over for lancets and test strip  Pt is out of these and needs these  Asap!!

## 2013-09-02 ENCOUNTER — Encounter: Payer: Self-pay | Admitting: Internal Medicine

## 2013-09-02 DIAGNOSIS — F411 Generalized anxiety disorder: Secondary | ICD-10-CM

## 2013-09-02 MED ORDER — OXYCODONE HCL 5 MG PO TABS: 5.0000 mg | ORAL_TABLET | Freq: Four times a day (QID) | ORAL | Status: DC | PRN

## 2013-09-02 MED ORDER — ALPRAZOLAM 0.5 MG PO TABS: 0.5000 mg | ORAL_TABLET | Freq: Four times a day (QID) | ORAL | Status: DC | PRN

## 2013-09-02 NOTE — Telephone Encounter (Signed)
Ok

## 2013-09-15 ENCOUNTER — Encounter: Payer: Self-pay | Admitting: Internal Medicine

## 2013-09-16 MED ORDER — CLOBETASOL PROPIONATE 0.05 % EX OINT: 1.0000 "application " | TOPICAL_OINTMENT | Freq: Two times a day (BID) | CUTANEOUS | Status: DC

## 2013-10-01 ENCOUNTER — Encounter: Payer: Self-pay | Admitting: *Deleted

## 2013-10-04 ENCOUNTER — Encounter: Payer: Self-pay | Admitting: Internal Medicine

## 2013-10-04 ENCOUNTER — Ambulatory Visit (INDEPENDENT_AMBULATORY_CARE_PROVIDER_SITE_OTHER): Payer: BC Managed Care – PPO | Admitting: Internal Medicine

## 2013-10-04 VITALS — BP 128/78 | HR 75 | Temp 97.6°F | Wt 193.0 lb

## 2013-10-04 DIAGNOSIS — IMO0001 Reserved for inherently not codable concepts without codable children: Secondary | ICD-10-CM

## 2013-10-04 DIAGNOSIS — IMO0002 Reserved for concepts with insufficient information to code with codable children: Secondary | ICD-10-CM

## 2013-10-04 DIAGNOSIS — C50919 Malignant neoplasm of unspecified site of unspecified female breast: Secondary | ICD-10-CM

## 2013-10-04 DIAGNOSIS — E1165 Type 2 diabetes mellitus with hyperglycemia: Secondary | ICD-10-CM

## 2013-10-04 LAB — COMPREHENSIVE METABOLIC PANEL
ALT: 36 U/L — ABNORMAL HIGH (ref 0–35)
AST: 35 U/L (ref 0–37)
Albumin: 4 g/dL (ref 3.5–5.2)
Alkaline Phosphatase: 53 U/L (ref 39–117)
BUN: 10 mg/dL (ref 6–23)
CO2: 24 mEq/L (ref 19–32)
Calcium: 8.9 mg/dL (ref 8.4–10.5)
Chloride: 102 mEq/L (ref 96–112)
Creatinine, Ser: 0.8 mg/dL (ref 0.4–1.2)
GFR: 77.82 mL/min (ref >60.00)
Glucose, Bld: 265 mg/dL — ABNORMAL HIGH (ref 70–99)
Potassium: 4.5 mEq/L (ref 3.5–5.1)
Sodium: 136 mEq/L (ref 135–145)
Total Bilirubin: 0.4 mg/dL (ref 0.3–1.2)
Total Protein: 7 g/dL (ref 6.0–8.3)

## 2013-10-04 LAB — CBC WITH DIFFERENTIAL/PLATELET
Basophils Absolute: 0 10*3/uL (ref 0.0–0.1)
Basophils Relative: 0.1 % (ref 0.0–3.0)
Eosinophils Absolute: 0.1 10*3/uL (ref 0.0–0.7)
Eosinophils Relative: 1.3 % (ref 0.0–5.0)
HCT: 37.5 % (ref 36.0–46.0)
Hemoglobin: 12.8 g/dL (ref 12.0–15.0)
Lymphocytes Relative: 21 % (ref 12.0–46.0)
Lymphs Abs: 1.4 10*3/uL (ref 0.7–4.0)
MCHC: 34.1 g/dL (ref 30.0–36.0)
MCV: 95.4 fl (ref 78.0–100.0)
Monocytes Absolute: 0.8 10*3/uL (ref 0.1–1.0)
Monocytes Relative: 12.7 % — ABNORMAL HIGH (ref 3.0–12.0)
Neutro Abs: 4.2 10*3/uL (ref 1.4–7.7)
Neutrophils Relative %: 64.9 % (ref 43.0–77.0)
Platelets: 218 10*3/uL (ref 150.0–400.0)
RBC: 3.93 Mil/uL (ref 3.87–5.11)
RDW: 15.1 % — ABNORMAL HIGH (ref 11.5–14.6)
WBC: 6.5 10*3/uL (ref 4.5–10.5)

## 2013-10-04 LAB — TSH: TSH: 0.38 u[IU]/mL (ref 0.35–5.50)

## 2013-10-04 LAB — HEMOGLOBIN A1C: Hgb A1c MFr Bld: 8.7 % — ABNORMAL HIGH (ref 4.6–6.5)

## 2013-10-04 MED ORDER — OXYCODONE HCL 5 MG PO TABS: 5.0000 mg | ORAL_TABLET | Freq: Four times a day (QID) | ORAL | Status: DC | PRN

## 2013-10-04 NOTE — Assessment & Plan Note (Signed)
Recently completed chemotherapy. Generally doing well except for fatigue and bony pain. Will continue oxycodone as needed for severe pain. Scheduled to start radiation therapy. Followup here in 3 months or sooner as needed.

## 2013-10-04 NOTE — Progress Notes (Signed)
Subjective:    Patient ID: Kristina Mccoy. [place], female    DOB: [birth year], 58 y.o.   MRN: [id number]  HPI 58 year old female with history of diabetes and recent diagnosis of breast cancer undergoing chemotherapy presents for followup. She reports that she completed her last chemotherapy last month. She is scheduled to start radiation therapy. She continues to feel fatigued. However, she reports her blood sugars have been much better controlled now that she has completed chemotherapy. She is no longer taking any insulin. She did not bring record of her blood sugars today. Aside from fatigue, she is feeling well. She notes some temperature intolerance but denies any fever or chills.  Outpatient Encounter Prescriptions as of 10/04/2013  Medication Sig  . ALPRAZolam (XANAX) 0.5 MG tablet Take 1 tablet (0.5 mg total) by mouth 4 (four) times daily as needed for anxiety.  . clobetasol ointment (TEMOVATE) 9.50 % Apply 1 application topically 2 (two) times daily.  Marland Kitchen DEXAMETHASONE PO Take by mouth. 10 mg day of chemo  . dicyclomine (BENTYL) 10 MG capsule Take 10 mg by mouth 4 (four) times daily -  before meals and at bedtime.  Marland Kitchen glucose blood (FREESTYLE LITE) test strip Use as instructed  . glyBURIDE (DIABETA) 5 MG tablet Take 2 tablets daily. May take an additional 2 tablets if blood glucose above 250.  Marland Kitchen HYDROcodone-acetaminophen (NORCO/VICODIN) 5-325 MG per tablet Take 1 tablet by mouth every 8 (eight) hours as needed for pain.  . hyoscyamine (LEVSIN, ANASPAZ) 0.125 MG tablet Take 1 tablet (0.125 mg total) by mouth every 4 (four) hours as needed.  . insulin aspart (NOVOLOG FLEXPEN) 100 UNIT/ML SOPN FlexPen Inject 4 Units into the skin 3 (three) times daily with meals. If blood sugar >250  . Lancets (FREESTYLE) lancets Use as instructed  . LORAZEPAM PO Take by mouth. Take 2nd for nausea  . nystatin-triamcinolone (MYCOLOG II) cream Apply topically 2 (two) times daily.  Marland Kitchen oxyCODONE (OXY IR/ROXICODONE) 5 MG  immediate release tablet Take 1 tablet (5 mg total) by mouth every 6 (six) hours as needed for severe pain.  Marland Kitchen prochlorperazine (COMPAZINE) 10 MG tablet Take 10 mg by mouth every 6 (six) hours as needed.  . ranitidine (ZANTAC) 300 MG tablet Take 1 tablet (300 mg total) by mouth daily.   BP 128/78  Pulse 75  Temp(Src) 97.6 F (36.4 C) (Oral)  Wt 193 lb (87.544 kg)  SpO2 97%  Review of Systems  Constitutional: Positive for fatigue. Negative for fever, chills, appetite change and unexpected weight change.  HENT: Negative for congestion, ear pain, sinus pressure, sore throat, trouble swallowing and voice change.   Eyes: Negative for visual disturbance.  Respiratory: Negative for cough, shortness of breath, wheezing and stridor.   Cardiovascular: Negative for chest pain, palpitations and leg swelling.  Gastrointestinal: Negative for nausea, vomiting, abdominal pain, diarrhea, constipation, blood in stool, abdominal distention and anal bleeding.  Genitourinary: Negative for dysuria and flank pain.  Musculoskeletal: Positive for arthralgias and myalgias. Negative for gait problem and neck pain.  Skin: Negative for color change and rash.  Neurological: Negative for dizziness and headaches.  Hematological: Negative for adenopathy. Does not bruise/bleed easily.  Psychiatric/Behavioral: Negative for suicidal ideas, sleep disturbance and dysphoric mood. The patient is not nervous/anxious.        Objective:   Physical Exam  Constitutional: She is oriented to person, place, and time. She appears well-developed and well-nourished. No distress.  HENT:  Head: Normocephalic and atraumatic.  Right Ear: External  ear normal.  Left Ear: External ear normal.  Nose: Nose normal.  Mouth/Throat: Oropharynx is clear and moist. No oropharyngeal exudate.  Eyes: Conjunctivae are normal. Pupils are equal, round, and reactive to light. Right eye exhibits no discharge. Left eye exhibits no discharge. No scleral  icterus.  Neck: Normal range of motion. Neck supple. No tracheal deviation present. No thyromegaly present.  Cardiovascular: Normal rate, regular rhythm, normal heart sounds and intact distal pulses.  Exam reveals no gallop and no friction rub.   No murmur heard. Pulmonary/Chest: Effort normal and breath sounds normal. No accessory muscle usage. Not tachypneic. No respiratory distress. She has no decreased breath sounds. She has no wheezes. She has no rhonchi. She has no rales. She exhibits no tenderness.  Musculoskeletal: Normal range of motion. She exhibits no edema and no tenderness.  Lymphadenopathy:    She has no cervical adenopathy.  Neurological: She is alert and oriented to person, place, and time. No cranial nerve deficit. She exhibits normal muscle tone. Coordination normal.  Skin: Skin is warm and dry. No rash noted. She is not diaphoretic. No erythema. No pallor.  Psychiatric: She has a normal mood and affect. Her behavior is normal. Judgment and thought content normal.          Assessment & Plan:

## 2013-10-04 NOTE — Assessment & Plan Note (Signed)
Blood sugars much better controlled now that she has completed chemotherapy. Will check A1c with labs today. Continue glyburide.

## 2013-10-08 ENCOUNTER — Encounter: Payer: Self-pay | Admitting: Internal Medicine

## 2013-10-27 ENCOUNTER — Encounter: Payer: Self-pay | Admitting: Internal Medicine

## 2013-10-27 DIAGNOSIS — F411 Generalized anxiety disorder: Secondary | ICD-10-CM

## 2013-10-28 ENCOUNTER — Encounter: Payer: Self-pay | Admitting: Internal Medicine

## 2013-10-28 MED ORDER — RANITIDINE HCL 300 MG PO TABS: 300.0000 mg | ORAL_TABLET | Freq: Every day | ORAL | Status: DC

## 2013-10-28 MED ORDER — ALPRAZOLAM 0.5 MG PO TABS: 0.5000 mg | ORAL_TABLET | Freq: Four times a day (QID) | ORAL | Status: DC | PRN

## 2013-10-28 NOTE — Telephone Encounter (Signed)
Could you please approve and sign off on her Alprazolam.

## 2013-10-29 NOTE — Telephone Encounter (Signed)
Rx faxed to pharmacy  

## 2013-11-26 ENCOUNTER — Ambulatory Visit: Payer: BC Managed Care – PPO | Admitting: Internal Medicine

## 2013-12-07 ENCOUNTER — Encounter: Payer: Self-pay | Admitting: Internal Medicine

## 2013-12-10 ENCOUNTER — Encounter: Payer: Self-pay | Admitting: *Deleted

## 2013-12-13 ENCOUNTER — Ambulatory Visit (INDEPENDENT_AMBULATORY_CARE_PROVIDER_SITE_OTHER): Payer: BC Managed Care – PPO | Admitting: Internal Medicine

## 2013-12-13 ENCOUNTER — Telehealth: Payer: Self-pay | Admitting: Internal Medicine

## 2013-12-13 ENCOUNTER — Encounter: Payer: Self-pay | Admitting: Internal Medicine

## 2013-12-13 ENCOUNTER — Telehealth: Payer: Self-pay | Admitting: *Deleted

## 2013-12-13 VITALS — BP 118/78 | HR 95 | Temp 98.0°F | Wt 191.0 lb

## 2013-12-13 DIAGNOSIS — T2121XA Burn of second degree of chest wall, initial encounter: Secondary | ICD-10-CM | POA: Insufficient documentation

## 2013-12-13 DIAGNOSIS — M797 Fibromyalgia: Secondary | ICD-10-CM

## 2013-12-13 DIAGNOSIS — C50919 Malignant neoplasm of unspecified site of unspecified female breast: Secondary | ICD-10-CM

## 2013-12-13 DIAGNOSIS — IMO0001 Reserved for inherently not codable concepts without codable children: Secondary | ICD-10-CM

## 2013-12-13 MED ORDER — OXYCODONE HCL ER 15 MG PO T12A: 15.0000 mg | EXTENDED_RELEASE_TABLET | Freq: Two times a day (BID) | ORAL | Status: DC

## 2013-12-13 MED ORDER — SILVER SULFADIAZINE 1 % EX CREA: 1.0000 "application " | TOPICAL_CREAM | Freq: Every day | CUTANEOUS | Status: DC

## 2013-12-13 MED ORDER — OXYCODONE HCL 5 MG PO TABS: 5.0000 mg | ORAL_TABLET | Freq: Four times a day (QID) | ORAL | Status: DC | PRN

## 2013-12-13 NOTE — Assessment & Plan Note (Signed)
Currently undergoing radiation therapy. Reviewed notes from [facility] today. Plan to start aromatase inhibitor in the near future. Will follow closely.

## 2013-12-13 NOTE — Telephone Encounter (Signed)
That is fine 

## 2013-12-13 NOTE — Telephone Encounter (Signed)
May use 11min.

## 2013-12-13 NOTE — Progress Notes (Signed)
Subjective:    Patient ID: Kristina Mccoy, female    DOB: January 03, 1956, 58 y.o.   MRN: [id number]  HPI 58YO female with breast cancer, currently undergoing XRT, fibromyalgia presents for follow up.  Breast cancer - has radiation burn on right breast. Severe pain, 10/10 at the site. Stopped XRT Thursday, Friday because of pain. Scheduled to resume today. Tearful describing severe pain in skin on right chest wall. Using oxycodone with minimal improvement.  She would like to apply for extended medical leave. She continues to be unable to work. She is currently going through radiation therapy and has significant pain as described above. She also reports difficulty concentrating.  Review of Systems  Constitutional: Positive for fatigue. Negative for fever, chills, appetite change and unexpected weight change.  HENT: Negative for congestion, ear pain, sinus pressure, sore throat, trouble swallowing and voice change.   Eyes: Negative for visual disturbance.  Respiratory: Negative for cough, shortness of breath, wheezing and stridor.   Cardiovascular: Negative for chest pain, palpitations and leg swelling.  Gastrointestinal: Negative for nausea, vomiting, abdominal pain, diarrhea, constipation, blood in stool, abdominal distention and anal bleeding.  Genitourinary: Negative for dysuria and flank pain.  Musculoskeletal: Positive for arthralgias and myalgias. Negative for gait problem and neck pain.  Skin: Positive for color change and wound. Negative for rash.  Neurological: Negative for dizziness and headaches.  Hematological: Negative for adenopathy. Does not bruise/bleed easily.  Psychiatric/Behavioral: Positive for sleep disturbance, dysphoric mood and decreased concentration. Negative for suicidal ideas. The patient is nervous/anxious.        Objective:    BP 118/78  Pulse 95  Temp(Src) 98 F (36.7 C) (Oral)  Wt 191 lb (86.637 kg)  SpO2 96% Physical Exam  Constitutional: She is  oriented to person, place, and time. She appears well-developed and well-nourished. No distress.  HENT:  Head: Normocephalic and atraumatic.  Right Ear: External ear normal.  Left Ear: External ear normal.  Nose: Nose normal.  Mouth/Throat: Oropharynx is clear and moist. No oropharyngeal exudate.  Eyes: Conjunctivae are normal. Pupils are equal, round, and reactive to light. Right eye exhibits no discharge. Left eye exhibits no discharge. No scleral icterus.  Neck: Normal range of motion. Neck supple. No tracheal deviation present. No thyromegaly present.  Cardiovascular: Normal rate, regular rhythm, normal heart sounds and intact distal pulses.  Exam reveals no gallop and no friction rub.   No murmur heard. Pulmonary/Chest: Effort normal and breath sounds normal. No accessory muscle usage. Not tachypneic. No respiratory distress. She has no decreased breath sounds. She has no wheezes. She has no rhonchi. She has no rales. She exhibits no tenderness.  Musculoskeletal: Normal range of motion. She exhibits no edema and no tenderness.  Lymphadenopathy:    She has no cervical adenopathy.  Neurological: She is alert and oriented to person, place, and time. No cranial nerve deficit. She exhibits normal muscle tone. Coordination normal.  Skin: Skin is warm and dry. Burn noted. No rash noted. She is not diaphoretic. There is erythema. No pallor.     Psychiatric: Her speech is normal and behavior is normal. Judgment and thought content normal. Her mood appears anxious. She exhibits a depressed mood.          Assessment & Plan:  Over 44min of which >50% spent in face-to-face contact with patient discussing plan of care    Problem List Items Addressed This Visit   Burn of chest wall, second degree - Primary  Exam is consistent with radiation burn to the right chest wall. Discussed with nursing staff from [facility] today. They will plan to hold scheduled radiation therapy. She will be evaluated by  radiation oncologist today. May ultimately need referral to [facility]. Will use topical Silvadene. Will add OxyContin 15 mg twice daily with oxycodone for breakthrough pain.    Relevant Medications      oxyCODONE (OXYCONTIN) 12 hr tablet      oxyCODONE (Oxy IR/ROXICODONE) immediate release tablet      SILVADENE 1 % EX CREA   Fibromyalgia     Chronic pain symptoms recently exacerbated with chemotherapy and radiation therapy. Symptoms poorly controlled with oxycodone as needed. Will add Oxycontin 15mg  po bid given current large chest wall burn and severe pain. Continue prn Oxycodone. Follow up 1 week.    Malignant neoplasm of breast (female)     Currently undergoing radiation therapy. Reviewed notes from [facility] today. Plan to start aromatase inhibitor in the near future. Will follow closely.        Return in about 4 days (around 12/17/2013).

## 2013-12-13 NOTE — Telephone Encounter (Signed)
Patient called and stated she went to oncologist at [facility] today as directed. However they will not do the referral to burn clinic. She was seen by Dr. Ronny Flurry and he told her he know it is severe but not sever enough to be referred to burn clinic, they see that all the time. Will see Dr. Luetta Nutting tomorrow, which is who she usually sees but she know he will agree with Dr. Ronny Flurry and not refer her out. So she would like for you to refer her to the burn clinic of your choice, referral coming from you if you agree with that.

## 2013-12-13 NOTE — Telephone Encounter (Signed)
Dr Gilford Rile You wanted Kristina Mccoy to come back for follow up in 3 days.  Please advise date and time your schedule is full Thanks

## 2013-12-13 NOTE — Assessment & Plan Note (Signed)
Exam is consistent with radiation burn to the right chest wall. Discussed with nursing staff from [facility] today. They will plan to hold scheduled radiation therapy. She will be evaluated by radiation oncologist today. May ultimately need referral to [place]. Will use topical Silvadene. Will add OxyContin 15 mg twice daily with oxycodone for breakthrough pain.

## 2013-12-13 NOTE — Progress Notes (Signed)
Pre visit review using our clinic review tool, if applicable. No additional management support is needed unless otherwise documented below in the visit note. 

## 2013-12-13 NOTE — Assessment & Plan Note (Signed)
Chronic pain symptoms recently exacerbated with chemotherapy and radiation therapy. Symptoms poorly controlled with oxycodone as needed. Will add Oxycontin 15mg  po bid given current large chest wall burn and severe pain. Continue prn Oxycodone. Follow up 1 week.

## 2013-12-13 NOTE — Telephone Encounter (Signed)
I would recommend that she talk to Dr. Luetta Nutting first tomorrow. He has a great deal of experience with these kind of radiation burns. If she does not feel comfortable with what he says, then we will set up referral at burn center.

## 2013-12-13 NOTE — Telephone Encounter (Signed)
You don't have any openings until Monday is that ok

## 2013-12-14 NOTE — Telephone Encounter (Signed)
Patient came in to pick up paperwork. While here I asked her about her visit with Dr. Elta Guadeloupe, per patient he agree with Dr. Ronny Flurry not to send her to the Burn clinic.

## 2013-12-16 NOTE — Telephone Encounter (Signed)
Pt has appointment 3/24

## 2013-12-16 NOTE — Telephone Encounter (Signed)
We could see her early next week.

## 2013-12-16 NOTE — Telephone Encounter (Signed)
Dr Gilford Rile Did you need Ms Abboud to come back this week??

## 2013-12-21 ENCOUNTER — Encounter: Payer: Self-pay | Admitting: Internal Medicine

## 2013-12-21 ENCOUNTER — Ambulatory Visit (INDEPENDENT_AMBULATORY_CARE_PROVIDER_SITE_OTHER): Payer: BC Managed Care – PPO | Admitting: Internal Medicine

## 2013-12-21 VITALS — BP 124/80 | HR 79 | Temp 98.1°F | Wt 190.0 lb

## 2013-12-21 DIAGNOSIS — T2121XA Burn of second degree of chest wall, initial encounter: Secondary | ICD-10-CM

## 2013-12-21 DIAGNOSIS — F411 Generalized anxiety disorder: Secondary | ICD-10-CM

## 2013-12-21 DIAGNOSIS — M3500 Sicca syndrome, unspecified: Secondary | ICD-10-CM

## 2013-12-21 LAB — ANGIOTENSIN CONVERTING ENZYME: Angiotensin-Converting Enzyme: 63 U/L — ABNORMAL HIGH (ref 8–52)

## 2013-12-21 LAB — RHEUMATOID FACTOR: Rhuematoid fact SerPl-aCnc: <10 IU/mL (ref <=14)

## 2013-12-21 MED ORDER — ALPRAZOLAM 0.5 MG PO TABS
0.5000 mg | ORAL_TABLET | Freq: Four times a day (QID) | ORAL | Status: DC | PRN
Start: 2013-12-21 — End: 2014-03-01

## 2013-12-21 NOTE — Progress Notes (Signed)
Subjective:    Patient ID: Kristina Mccoy, female    DOB: Aug 10, 1956, 58 y.o.   MRN: [id number]  HPI 58YO female presents for acute visit. She continues to have pain over her right chest wall radiation burn. However, pain has improved compared to previous. She has stopped OxyContin. She continues to take oxycodone 5 mg every 3-6 hours for severe pain. Her radiation therapy has been stopped because of the burn.  She brings notes from her rheumatologist today requesting additional labs for evaluation of Sjogren's syndrome. She continues to have diffuse arthralgia and myalgia. She had this prior to starting chemotherapy but reports that symptoms are much worse after chemotherapy. She is finding it difficult to function. She has applied for disability from work.   Review of Systems  Constitutional: Positive for fatigue. Negative for fever, chills, appetite change and unexpected weight change.  HENT: Negative for congestion, ear pain, sinus pressure, sore throat, trouble swallowing and voice change.   Eyes: Negative for visual disturbance.  Respiratory: Negative for cough, shortness of breath, wheezing and stridor.   Cardiovascular: Negative for chest pain, palpitations and leg swelling.  Gastrointestinal: Negative for nausea, vomiting, abdominal pain, diarrhea, constipation, blood in stool, abdominal distention and anal bleeding.  Genitourinary: Negative for dysuria and flank pain.  Musculoskeletal: Positive for arthralgias and myalgias. Negative for gait problem and neck pain.  Skin: Positive for color change. Negative for rash.  Neurological: Negative for dizziness and headaches.  Hematological: Negative for adenopathy. Does not bruise/bleed easily.  Psychiatric/Behavioral: Negative for suicidal ideas, sleep disturbance and dysphoric mood. The patient is not nervous/anxious.        Objective:    BP 124/80  Pulse 79  Temp(Src) 98.1 F (36.7 C) (Oral)  Wt 190 lb (86.183 kg)  SpO2  97% Physical Exam  Constitutional: She is oriented to person, place, and time. She appears well-developed and well-nourished. No distress.  HENT:  Head: Normocephalic and atraumatic.  Right Ear: External ear normal.  Left Ear: External ear normal.  Nose: Nose normal.  Mouth/Throat: Oropharynx is clear and moist. No oropharyngeal exudate.  Eyes: Conjunctivae are normal. Pupils are equal, round, and reactive to light. Right eye exhibits no discharge. Left eye exhibits no discharge. No scleral icterus.  Neck: Normal range of motion. Neck supple. No tracheal deviation present. No thyromegaly present.  Cardiovascular: Normal rate, regular rhythm, normal heart sounds and intact distal pulses.  Exam reveals no gallop and no friction rub.   No murmur heard. Pulmonary/Chest: Effort normal and breath sounds normal. No accessory muscle usage. Not tachypneic. No respiratory distress. She has no decreased breath sounds. She has no wheezes. She has no rhonchi. She has no rales. She exhibits no tenderness.  Musculoskeletal: Normal range of motion. She exhibits no edema and no tenderness.  Lymphadenopathy:    She has no cervical adenopathy.  Neurological: She is alert and oriented to person, place, and time. No cranial nerve deficit. She exhibits normal muscle tone. Coordination normal.  Skin: Skin is warm and dry. No rash noted. She is not diaphoretic. There is erythema. No pallor.     Psychiatric: She has a normal mood and affect. Her behavior is normal. Judgment and thought content normal.          Assessment & Plan:   Problem List Items Addressed This Visit   Anxiety state, unspecified   Relevant Medications      ALPRAZolam (XANAX) tablet   Burn of chest wall, second degree - Primary  Radiation burn of the chest wall has improved compared to previous. Will continue Silvadene.    Gougerout-Sjoegren syndrome      Worsening symptoms of arthralgia and myalgia. Labs done today including ESR,  CRP, CMP, ANA, rheumatoid factor, compliment levels. We'll plan to fax these to her rheumatologist.    Relevant Orders      C3 complement      C4 complement      Rheumatoid Factor      Angiotensin converting enzyme      Vit D  25 hydroxy (rtn osteoporosis monitoring)      Cyclic citrul peptide antibody, IgG      ANA      Sed Rate (ESR)      C-reactive protein      CK (Creatine Kinase)       Return in about 4 weeks (around 01/18/2014).

## 2013-12-21 NOTE — Progress Notes (Signed)
Pre visit review using our clinic review tool, if applicable. No additional management support is needed unless otherwise documented below in the visit note. 

## 2013-12-21 NOTE — Assessment & Plan Note (Signed)
Radiation burn of the chest wall has improved compared to previous. Will continue Silvadene.

## 2013-12-21 NOTE — Assessment & Plan Note (Signed)
Worsening symptoms of arthralgia and myalgia. Labs done today including ESR, CRP, CMP, ANA, rheumatoid factor, compliment levels. We'll plan to fax these to her rheumatologist.

## 2013-12-22 LAB — C3 COMPLEMENT: C3 Complement: 152 mg/dL (ref 90–180)

## 2013-12-22 LAB — CK: Total CK: 74 U/L (ref 7–177)

## 2013-12-22 LAB — SEDIMENTATION RATE: Sed Rate: 21 mm/hr (ref 0–22)

## 2013-12-22 LAB — VITAMIN D 25 HYDROXY (VIT D DEFICIENCY, FRACTURES): Vit D, 25-Hydroxy: 35 ng/mL (ref 30–89)

## 2013-12-22 LAB — C-REACTIVE PROTEIN: CRP: 1.5 mg/dL (ref 0.5–20.0)

## 2013-12-22 LAB — C4 COMPLEMENT: C4 Complement: 32 mg/dL (ref 10–40)

## 2013-12-22 LAB — CYCLIC CITRUL PEPTIDE ANTIBODY, IGG: Cyclic Citrullin Peptide Ab: <2 U/mL (ref 0.0–5.0)

## 2013-12-23 LAB — ANTI-NUCLEAR AB-TITER (ANA TITER): ANA Titer 1: NEGATIVE

## 2013-12-23 LAB — ANA: Anti Nuclear Antibody(ANA): POSITIVE — AB

## 2014-01-03 ENCOUNTER — Ambulatory Visit: Payer: BC Managed Care – PPO | Admitting: Internal Medicine

## 2014-01-04 ENCOUNTER — Ambulatory Visit: Payer: BC Managed Care – PPO | Admitting: Internal Medicine

## 2014-01-24 ENCOUNTER — Encounter: Payer: Self-pay | Admitting: Internal Medicine

## 2014-01-24 ENCOUNTER — Ambulatory Visit (INDEPENDENT_AMBULATORY_CARE_PROVIDER_SITE_OTHER): Payer: BC Managed Care – PPO | Admitting: Internal Medicine

## 2014-01-24 VITALS — BP 124/72 | HR 93 | Temp 97.6°F | Resp 14 | Wt 195.5 lb

## 2014-01-24 DIAGNOSIS — IMO0001 Reserved for inherently not codable concepts without codable children: Secondary | ICD-10-CM

## 2014-01-24 DIAGNOSIS — E119 Type 2 diabetes mellitus without complications: Secondary | ICD-10-CM

## 2014-01-24 DIAGNOSIS — E1165 Type 2 diabetes mellitus with hyperglycemia: Secondary | ICD-10-CM

## 2014-01-24 DIAGNOSIS — F4323 Adjustment disorder with mixed anxiety and depressed mood: Secondary | ICD-10-CM | POA: Insufficient documentation

## 2014-01-24 DIAGNOSIS — R519 Headache, unspecified: Secondary | ICD-10-CM | POA: Insufficient documentation

## 2014-01-24 DIAGNOSIS — R51 Headache: Secondary | ICD-10-CM | POA: Insufficient documentation

## 2014-01-24 DIAGNOSIS — IMO0002 Reserved for concepts with insufficient information to code with codable children: Secondary | ICD-10-CM

## 2014-01-24 DIAGNOSIS — M797 Fibromyalgia: Secondary | ICD-10-CM

## 2014-01-24 LAB — CBC WITH DIFFERENTIAL/PLATELET
Basophils Absolute: 0 10*3/uL (ref 0.0–0.1)
Basophils Relative: 0.4 % (ref 0.0–3.0)
Eosinophils Absolute: 0.1 10*3/uL (ref 0.0–0.7)
Eosinophils Relative: 2.2 % (ref 0.0–5.0)
HCT: 42.3 % (ref 36.0–46.0)
Hemoglobin: 14.5 g/dL (ref 12.0–15.0)
Lymphocytes Relative: 17.6 % (ref 12.0–46.0)
Lymphs Abs: 1 10*3/uL (ref 0.7–4.0)
MCHC: 34.4 g/dL (ref 30.0–36.0)
MCV: 91.3 fl (ref 78.0–100.0)
Monocytes Absolute: 0.7 10*3/uL (ref 0.1–1.0)
Monocytes Relative: 12.6 % — ABNORMAL HIGH (ref 3.0–12.0)
Neutro Abs: 3.9 10*3/uL (ref 1.4–7.7)
Neutrophils Relative %: 67.2 % (ref 43.0–77.0)
Platelets: 205 10*3/uL (ref 150.0–400.0)
RBC: 4.63 Mil/uL (ref 3.87–5.11)
RDW: 15.1 % — ABNORMAL HIGH (ref 11.5–14.6)
WBC: 5.8 10*3/uL (ref 4.5–10.5)

## 2014-01-24 LAB — LIPID PANEL
Cholesterol: 206 mg/dL — ABNORMAL HIGH (ref 0–200)
HDL: 39.2 mg/dL (ref >39.00)
LDL Cholesterol: 115 mg/dL — ABNORMAL HIGH (ref 0–99)
Total CHOL/HDL Ratio: 5
Triglycerides: 259 mg/dL — ABNORMAL HIGH (ref 0.0–149.0)
VLDL: 51.8 mg/dL — ABNORMAL HIGH (ref 0.0–40.0)

## 2014-01-24 LAB — HEMOGLOBIN A1C: Hgb A1c MFr Bld: 9.4 % — ABNORMAL HIGH (ref 4.6–6.5)

## 2014-01-24 LAB — COMPREHENSIVE METABOLIC PANEL
ALT: 33 U/L (ref 0–35)
AST: 27 U/L (ref 0–37)
Albumin: 3.9 g/dL (ref 3.5–5.2)
Alkaline Phosphatase: 63 U/L (ref 39–117)
BUN: 16 mg/dL (ref 6–23)
CO2: 25 mEq/L (ref 19–32)
Calcium: 9.2 mg/dL (ref 8.4–10.5)
Chloride: 99 mEq/L (ref 96–112)
Creatinine, Ser: 0.8 mg/dL (ref 0.4–1.2)
GFR: 82.41 mL/min (ref >60.00)
Glucose, Bld: 339 mg/dL — ABNORMAL HIGH (ref 70–99)
Potassium: 4.5 mEq/L (ref 3.5–5.1)
Sodium: 135 mEq/L (ref 135–145)
Total Bilirubin: 0.3 mg/dL (ref 0.3–1.2)
Total Protein: 7.3 g/dL (ref 6.0–8.3)

## 2014-01-24 LAB — TSH: TSH: 0.38 u[IU]/mL (ref 0.35–5.50)

## 2014-01-24 MED ORDER — GLYBURIDE 5 MG PO TABS: ORAL_TABLET | ORAL | Status: DC

## 2014-01-24 NOTE — Assessment & Plan Note (Signed)
MRI brain pending. Will check CBC with labs today, as question if anemia may be contributing.

## 2014-01-24 NOTE — Progress Notes (Signed)
Pre visit review using our clinic review tool, if applicable. No additional management support is needed unless otherwise documented below in the visit note. 

## 2014-01-24 NOTE — Assessment & Plan Note (Signed)
Will check A1c with labs today. 

## 2014-01-24 NOTE — Assessment & Plan Note (Signed)
Symptoms significantly worsened after chemotherapy and radiation. Allergic to Gabapentin and Lyrica. Suspect she would benefit from long-acting narcotic for better pain control. Will set up evaluation with pain management.

## 2014-01-24 NOTE — Progress Notes (Signed)
Subjective:    Patient ID: Kristina Mccoy, female    DOB: 1956-04-08, 58 y.o.   MRN: [id number]  HPI 58YO female with h/o DM and breast cancer presents for follow up.  DM - BG running near 190 fasting. Compliant with meds.   Having headaches since starting radiation therapy. Oncologist has ordered MRI brain and this is scheduled. Had mild anemia from chemo which was noted.  Chest wall burn has healed.  Recently noted to have mild elevation of ACE level on labs. Planning for CXR and additional labs through rheumatologist.  Chronic pain - stabbing pain, radiates throughout body, occasionally radiates down arms. Knees aching. Much worse with chemo and radiation therapy. Taking Advil with minimal improvement. Occasionally uses Oxycodone with some improvement.  Review of Systems  Constitutional: Negative for fever, chills, appetite change, fatigue and unexpected weight change.  HENT: Negative for congestion, ear pain, sinus pressure, sore throat, trouble swallowing and voice change.   Eyes: Negative for visual disturbance.  Respiratory: Negative for cough, shortness of breath, wheezing and stridor.   Cardiovascular: Negative for chest pain, palpitations and leg swelling.  Gastrointestinal: Negative for nausea, vomiting, abdominal pain, diarrhea, constipation, blood in stool, abdominal distention and anal bleeding.  Genitourinary: Negative for dysuria and flank pain.  Musculoskeletal: Negative for arthralgias, gait problem, myalgias and neck pain.  Skin: Negative for color change and rash.  Neurological: Positive for headaches. Negative for dizziness.  Hematological: Negative for adenopathy. Does not bruise/bleed easily.  Psychiatric/Behavioral: Negative for suicidal ideas, sleep disturbance and dysphoric mood. The patient is not nervous/anxious.        Objective:    BP 124/72  Pulse 93  Temp(Src) 97.6 F (36.4 C) (Oral)  Resp 14  Wt 195 lb 8 oz (88.678 kg)  SpO2 98% Physical  Exam  Constitutional: She is oriented to person, place, and time. She appears well-developed and well-nourished. No distress.  HENT:  Head: Normocephalic and atraumatic.  Right Ear: External ear normal.  Left Ear: External ear normal.  Nose: Nose normal.  Mouth/Throat: Oropharynx is clear and moist. No oropharyngeal exudate.  Eyes: Conjunctivae are normal. Pupils are equal, round, and reactive to light. Right eye exhibits no discharge. Left eye exhibits no discharge. No scleral icterus.  Neck: Normal range of motion. Neck supple. No tracheal deviation present. No thyromegaly present.  Cardiovascular: Normal rate, regular rhythm, normal heart sounds and intact distal pulses.  Exam reveals no gallop and no friction rub.   No murmur heard. Pulmonary/Chest: Effort normal and breath sounds normal. No accessory muscle usage. Not tachypneic. No respiratory distress. She has no decreased breath sounds. She has no wheezes. She has no rhonchi. She has no rales. She exhibits no tenderness.  Musculoskeletal: Normal range of motion. She exhibits no edema and no tenderness.  Lymphadenopathy:    She has no cervical adenopathy.  Neurological: She is alert and oriented to person, place, and time. No cranial nerve deficit. She exhibits normal muscle tone. Coordination normal.  Skin: Skin is warm and dry. No rash noted. She is not diaphoretic. There is erythema. No pallor.     Psychiatric: Her behavior is normal. Judgment and thought content normal. She exhibits a depressed mood.          Assessment & Plan:   Problem List Items Addressed This Visit   Adjustment disorder with mixed anxiety and depressed mood     Symptoms of depressed mood persistent, however improved compared to previous. Will set up evaluation with  psychology. Suspect counseling would be helpful. Follow up 3 months and prn.    Relevant Orders      Ambulatory referral to Psychology   Diabetes type 2, uncontrolled - Primary     Will  check A1c with labs today.    Relevant Medications      glyBURIDE (DIABETA) tablet   Other Relevant Orders      Hemoglobin A1c      Comprehensive metabolic panel      Lipid panel   Fibromyalgia     Symptoms significantly worsened after chemotherapy and radiation. Allergic to Gabapentin and Lyrica. Suspect she would benefit from long-acting narcotic for better pain control. Will set up evaluation with pain management.    Relevant Orders      Ambulatory referral to Pain Clinic   Headache(784.0)     MRI brain pending. Will check CBC with labs today, as question if anemia may be contributing.    Relevant Orders      CBC with Differential      TSH    Other Visit Diagnoses   Diabetes mellitus, controlled        Relevant Medications       glyBURIDE (DIABETA) tablet        Return in about 3 months (around 04/25/2014) for Recheck of Diabetes.

## 2014-01-24 NOTE — Assessment & Plan Note (Signed)
Symptoms of depressed mood persistent, however improved compared to previous. Will set up evaluation with psychology. Suspect counseling would be helpful. Follow up 3 months and prn.

## 2014-01-27 ENCOUNTER — Encounter: Payer: Self-pay | Admitting: Internal Medicine

## 2014-01-28 ENCOUNTER — Telehealth: Payer: Self-pay | Admitting: Internal Medicine

## 2014-01-28 ENCOUNTER — Telehealth: Payer: Self-pay

## 2014-01-28 NOTE — Telephone Encounter (Signed)
The patient stated that she discuss going to a Rheumatologist with the physician. Can you put a referral in the system.

## 2014-01-28 NOTE — Telephone Encounter (Signed)
Relevant patient education assigned to patient using Emmi. ° °

## 2014-01-28 NOTE — Telephone Encounter (Signed)
Referral was placed 01/24/14 for pain clinic for fibromyalgia, is that not what she was wanting?

## 2014-02-08 ENCOUNTER — Ambulatory Visit (INDEPENDENT_AMBULATORY_CARE_PROVIDER_SITE_OTHER): Payer: BC Managed Care – PPO | Admitting: Psychology

## 2014-02-08 DIAGNOSIS — F331 Major depressive disorder, recurrent, moderate: Secondary | ICD-10-CM

## 2014-02-15 ENCOUNTER — Ambulatory Visit: Payer: BC Managed Care – PPO | Admitting: Psychology

## 2014-02-28 ENCOUNTER — Encounter: Payer: Self-pay | Admitting: Internal Medicine

## 2014-02-28 DIAGNOSIS — F411 Generalized anxiety disorder: Secondary | ICD-10-CM

## 2014-03-01 ENCOUNTER — Encounter: Payer: Self-pay | Admitting: Internal Medicine

## 2014-03-01 MED ORDER — ALPRAZOLAM 0.5 MG PO TABS: 0.5000 mg | ORAL_TABLET | Freq: Four times a day (QID) | ORAL | Status: DC | PRN

## 2014-03-01 NOTE — Telephone Encounter (Signed)
Ok to refill per Dr. Gilford Rile, Rx printed.

## 2014-03-04 ENCOUNTER — Encounter: Payer: Self-pay | Admitting: Internal Medicine

## 2014-03-04 DIAGNOSIS — T2121XA Burn of second degree of chest wall, initial encounter: Secondary | ICD-10-CM

## 2014-03-04 MED ORDER — OXYCODONE HCL 5 MG PO TABS: 5.0000 mg | ORAL_TABLET | Freq: Four times a day (QID) | ORAL | Status: DC | PRN

## 2014-03-04 NOTE — Telephone Encounter (Signed)
Sent mychart requesting refill, ok?

## 2014-03-08 ENCOUNTER — Ambulatory Visit (INDEPENDENT_AMBULATORY_CARE_PROVIDER_SITE_OTHER): Payer: BC Managed Care – PPO | Admitting: Psychology

## 2014-03-08 DIAGNOSIS — F331 Major depressive disorder, recurrent, moderate: Secondary | ICD-10-CM

## 2014-04-22 ENCOUNTER — Encounter: Payer: Self-pay | Admitting: *Deleted

## 2014-04-22 NOTE — Progress Notes (Signed)
Chart reviewed for DM bundle. Appt sch 05/04/14

## 2014-05-04 ENCOUNTER — Encounter: Payer: Self-pay | Admitting: Internal Medicine

## 2014-05-04 ENCOUNTER — Ambulatory Visit (INDEPENDENT_AMBULATORY_CARE_PROVIDER_SITE_OTHER): Payer: BC Managed Care – PPO | Admitting: Internal Medicine

## 2014-05-04 VITALS — BP 120/68 | HR 82 | Temp 98.2°F | Ht 64.0 in | Wt 189.5 lb

## 2014-05-04 DIAGNOSIS — R5381 Other malaise: Secondary | ICD-10-CM

## 2014-05-04 DIAGNOSIS — F4323 Adjustment disorder with mixed anxiety and depressed mood: Secondary | ICD-10-CM

## 2014-05-04 DIAGNOSIS — R0609 Other forms of dyspnea: Secondary | ICD-10-CM

## 2014-05-04 DIAGNOSIS — R5383 Other fatigue: Secondary | ICD-10-CM | POA: Insufficient documentation

## 2014-05-04 DIAGNOSIS — IMO0001 Reserved for inherently not codable concepts without codable children: Secondary | ICD-10-CM

## 2014-05-04 DIAGNOSIS — R0989 Other specified symptoms and signs involving the circulatory and respiratory systems: Secondary | ICD-10-CM

## 2014-05-04 DIAGNOSIS — IMO0002 Reserved for concepts with insufficient information to code with codable children: Secondary | ICD-10-CM

## 2014-05-04 DIAGNOSIS — E1165 Type 2 diabetes mellitus with hyperglycemia: Secondary | ICD-10-CM

## 2014-05-04 DIAGNOSIS — R0600 Dyspnea, unspecified: Secondary | ICD-10-CM | POA: Insufficient documentation

## 2014-05-04 LAB — COMPREHENSIVE METABOLIC PANEL
ALT: 38 U/L — ABNORMAL HIGH (ref 0–35)
AST: 27 U/L (ref 0–37)
Albumin: 4.1 g/dL (ref 3.5–5.2)
Alkaline Phosphatase: 84 U/L (ref 39–117)
BUN: 12 mg/dL (ref 6–23)
CO2: 25 mEq/L (ref 19–32)
Calcium: 9.3 mg/dL (ref 8.4–10.5)
Chloride: 97 mEq/L (ref 96–112)
Creatinine, Ser: 0.8 mg/dL (ref 0.4–1.2)
GFR: 81.11 mL/min (ref >60.00)
Glucose, Bld: 154 mg/dL — ABNORMAL HIGH (ref 70–99)
Potassium: 4.4 mEq/L (ref 3.5–5.1)
Sodium: 134 mEq/L — ABNORMAL LOW (ref 135–145)
Total Bilirubin: 0.5 mg/dL (ref 0.2–1.2)
Total Protein: 7.5 g/dL (ref 6.0–8.3)

## 2014-05-04 LAB — CBC WITH DIFFERENTIAL/PLATELET
Basophils Absolute: 0 10*3/uL (ref 0.0–0.1)
Basophils Relative: 0.5 % (ref 0.0–3.0)
Eosinophils Absolute: 0.2 10*3/uL (ref 0.0–0.7)
Eosinophils Relative: 2.6 % (ref 0.0–5.0)
HCT: 43.5 % (ref 36.0–46.0)
Hemoglobin: 14.4 g/dL (ref 12.0–15.0)
Lymphocytes Relative: 15.8 % (ref 12.0–46.0)
Lymphs Abs: 1.2 10*3/uL (ref 0.7–4.0)
MCHC: 33.2 g/dL (ref 30.0–36.0)
MCV: 93.4 fl (ref 78.0–100.0)
Monocytes Absolute: 0.8 10*3/uL (ref 0.1–1.0)
Monocytes Relative: 10.3 % (ref 3.0–12.0)
Neutro Abs: 5.4 10*3/uL (ref 1.4–7.7)
Neutrophils Relative %: 70.8 % (ref 43.0–77.0)
Platelets: 201 10*3/uL (ref 150.0–400.0)
RBC: 4.66 Mil/uL (ref 3.87–5.11)
RDW: 14.1 % (ref 11.5–15.5)
WBC: 7.7 10*3/uL (ref 4.0–10.5)

## 2014-05-04 LAB — LIPID PANEL
Cholesterol: 210 mg/dL — ABNORMAL HIGH (ref 0–200)
HDL: 44.9 mg/dL (ref >39.00)
NonHDL: 165.1
Total CHOL/HDL Ratio: 5
Triglycerides: 239 mg/dL — ABNORMAL HIGH (ref 0.0–149.0)
VLDL: 47.8 mg/dL — ABNORMAL HIGH (ref 0.0–40.0)

## 2014-05-04 LAB — MICROALBUMIN / CREATININE URINE RATIO
Creatinine,U: 14.3 mg/dL
Microalb Creat Ratio: 1.4 mg/g (ref 0.0–30.0)
Microalb, Ur: 0.2 mg/dL (ref 0.0–1.9)

## 2014-05-04 LAB — VITAMIN B12: Vitamin B-12: 528 pg/mL (ref 211–911)

## 2014-05-04 LAB — HM DIABETES FOOT EXAM: HM Diabetic Foot Exam: NORMAL

## 2014-05-04 LAB — HEMOGLOBIN A1C: Hgb A1c MFr Bld: 10 % — ABNORMAL HIGH (ref 4.6–6.5)

## 2014-05-04 LAB — TSH: TSH: 0.63 u[IU]/mL (ref 0.35–4.50)

## 2014-05-04 LAB — LDL CHOLESTEROL, DIRECT: Direct LDL: 155.3 mg/dL

## 2014-05-04 NOTE — Assessment & Plan Note (Signed)
Chronic symptoms of fatigue, made worse after recent chemotherapy and radiation therapy. Will check CBC, CMP, TSH, B12 with labs.

## 2014-05-04 NOTE — Assessment & Plan Note (Signed)
Will check A1c with labs today. Pt had adverse reaction to Levemir with bloating and itching. We discussed potentially trying another oral medication if BG continue to be elevated. Will await lab results.

## 2014-05-04 NOTE — Progress Notes (Signed)
Subjective:    Patient ID: Kristina Mccoy, female    DOB: Feb 28, 1956, 58 y.o.   MRN: [id number]  HPI 58YO female presents for follow up.  DM - Stopped taking all insulin. Taking Glyburide only. Average BG 225. Trying to lose weight, walking 72min every morning. Would prefer to stay off insulin, as she had bloating and itching while taking Levemir.  Dyspnea - occurs at rest during the day. Able to walk in the mornings without problems, about 1 mile. No chest pain. No orthopnea. Occasional palpitations.  Continues to feel generally fatigued with chronic diffuse pain, made worse after recent chemotherapy. Trying to wean off any pain medication. Only taking Oxycodone on rare occasion.  Review of Systems  Constitutional: Positive for fatigue. Negative for fever, chills, appetite change and unexpected weight change.  Eyes: Negative for visual disturbance.  Respiratory: Positive for shortness of breath. Negative for cough, chest tightness and wheezing.   Cardiovascular: Negative for chest pain and leg swelling.  Gastrointestinal: Negative for vomiting, abdominal pain, diarrhea, constipation and rectal pain.  Musculoskeletal: Positive for arthralgias, back pain and myalgias.  Skin: Negative for color change and rash.  Hematological: Negative for adenopathy. Does not bruise/bleed easily.  Psychiatric/Behavioral: Positive for dysphoric mood. Negative for suicidal ideas and sleep disturbance. The patient is not nervous/anxious.        Objective:    BP 120/68  Pulse 82  Temp(Src) 98.2 F (36.8 C) (Oral)  Ht 5\' 4"  (1.626 m)  Wt 189 lb 8 oz (85.957 kg)  BMI 32.51 kg/m2  SpO2 97% Physical Exam  Constitutional: She is oriented to person, place, and time. She appears well-developed and well-nourished. No distress.  HENT:  Head: Normocephalic and atraumatic.  Right Ear: External ear normal.  Left Ear: External ear normal.  Nose: Nose normal.  Mouth/Throat: Oropharynx is clear and moist.  No oropharyngeal exudate.  Eyes: Conjunctivae are normal. Pupils are equal, round, and reactive to light. Right eye exhibits no discharge. Left eye exhibits no discharge. No scleral icterus.  Neck: Normal range of motion. Neck supple. No tracheal deviation present. No thyromegaly present.  Cardiovascular: Normal rate, regular rhythm, normal heart sounds and intact distal pulses.  Exam reveals no gallop and no friction rub.   No murmur heard. Pulmonary/Chest: Effort normal and breath sounds normal. No accessory muscle usage. Not tachypneic. No respiratory distress. She has no decreased breath sounds. She has no wheezes. She has no rhonchi. She has no rales. She exhibits no tenderness.  Musculoskeletal: Normal range of motion. She exhibits no edema and no tenderness.  Lymphadenopathy:    She has no cervical adenopathy.  Neurological: She is alert and oriented to person, place, and time. No cranial nerve deficit. She exhibits normal muscle tone. Coordination normal.  Skin: Skin is warm and dry. No rash noted. She is not diaphoretic. No erythema. No pallor.  Psychiatric: Her behavior is normal. Judgment and thought content normal. She exhibits a depressed mood.          Assessment & Plan:   Problem List Items Addressed This Visit     Unprioritized   Adjustment disorder with mixed anxiety and depressed mood     Difficult time for her. Out of work after treatment for breast cancer. Offered support today. Will continue close follow up. She will call if symptoms are not improving.    Diabetes type 2, uncontrolled - Primary     Will check A1c with labs today. Pt had adverse reaction to  Levemir with bloating and itching. We discussed potentially trying another oral medication if BG continue to be elevated. Will await lab results.    Relevant Orders      Comprehensive metabolic panel      Hemoglobin A1c      Lipid panel      Microalbumin / creatinine urine ratio      POCT Urinalysis Dipstick     Dyspnea     Mild dyspnea noted. Exam normal. S/p recent XRT to chest. Last ECHO over 1 year ago. Will set up follow up cardiology evaluation for repeat ECHO given increased risk of CAD and CHF.     Relevant Orders      Ambulatory referral to Cardiology   Other fatigue     Chronic symptoms of fatigue, made worse after recent chemotherapy and radiation therapy. Will check CBC, CMP, TSH, B12 with labs.    Relevant Orders      TSH      CBC with Differential      B12       Return in about 4 weeks (around 06/01/2014) for Physical.

## 2014-05-04 NOTE — Assessment & Plan Note (Signed)
Mild dyspnea noted. Exam normal. S/p recent XRT to chest. Last ECHO over 1 year ago. Will set up follow up cardiology evaluation for repeat ECHO given increased risk of CAD and CHF.

## 2014-05-04 NOTE — Patient Instructions (Signed)
Labs today.  Follow up in 4 weeks or sooner as needed. 

## 2014-05-04 NOTE — Assessment & Plan Note (Signed)
Difficult time for her. Out of work after treatment for breast cancer. Offered support today. Will continue close follow up. She will call if symptoms are not improving.

## 2014-05-04 NOTE — Addendum Note (Signed)
Addended by: Ronette Deter A on: 05/04/2014 05:20 PM   Modules accepted: Orders

## 2014-05-04 NOTE — Progress Notes (Signed)
Pre visit review using our clinic review tool, if applicable. No additional management support is needed unless otherwise documented below in the visit note. 

## 2014-05-09 ENCOUNTER — Encounter: Payer: Self-pay | Admitting: Internal Medicine

## 2014-05-10 ENCOUNTER — Encounter: Payer: Self-pay | Admitting: Adult Health

## 2014-05-10 ENCOUNTER — Ambulatory Visit (INDEPENDENT_AMBULATORY_CARE_PROVIDER_SITE_OTHER): Payer: BC Managed Care – PPO | Admitting: Adult Health

## 2014-05-10 VITALS — BP 125/81 | HR 83 | Temp 98.3°F | Resp 14 | Ht 64.0 in | Wt 190.5 lb

## 2014-05-10 DIAGNOSIS — M1990 Unspecified osteoarthritis, unspecified site: Secondary | ICD-10-CM

## 2014-05-10 DIAGNOSIS — R21 Rash and other nonspecific skin eruption: Secondary | ICD-10-CM

## 2014-05-10 DIAGNOSIS — M129 Arthropathy, unspecified: Secondary | ICD-10-CM

## 2014-05-10 MED ORDER — MELOXICAM 7.5 MG PO TABS: 7.5000 mg | ORAL_TABLET | Freq: Every day | ORAL | Status: DC

## 2014-05-10 NOTE — Progress Notes (Signed)
Pre visit review using our clinic review tool, if applicable. No additional management support is needed unless otherwise documented below in the visit note. 

## 2014-05-10 NOTE — Progress Notes (Signed)
Patient ID: Kristina Mccoy. [place], female   DOB: [birth year], 58 y.o.   MRN: [id number]    Subjective:    Patient ID: Kristina Mccoy. [place], female    DOB: Oct 04, 1955, 58 y.o.   MRN: [id number]  HPI Pt presents with a rash on her arms, abdomen, legs that began several days ago. She denies changing any soaps, lotions, medications. She has not been outside in the yard.  She also has a history of arthritis and joint pain. Reports that ibuprofen is not helping her any more. She is wondering if there is any medication that may be more helpful.   Past Medical History  Diagnosis Date  . Otitis media, chronic   . Diabetes mellitus   . Fibromyalgia   . Breast cancer 5/14    left breast  . Lymphedema of arm     right    Current Outpatient Prescriptions on File Prior to Visit  Medication Sig Dispense Refill  . ALPRAZolam (XANAX) 0.5 MG tablet Take 1 tablet (0.5 mg total) by mouth 4 (four) times daily as needed for anxiety.  120 tablet  1  . glucose blood (FREESTYLE LITE) test strip Use as instructed  100 each  5  . glyBURIDE (DIABETA) 5 MG tablet Take 2 tablets daily. May take an additional 2 tablets if blood glucose above 250.  120 tablet  3  . hyoscyamine (LEVSIN, ANASPAZ) 0.125 MG tablet Take 1 tablet (0.125 mg total) by mouth every 4 (four) hours as needed.  30 tablet  0  . Lancets (FREESTYLE) lancets Use as instructed  100 each  5  . LORAZEPAM PO Take by mouth. Take 2nd for nausea      . oxyCODONE (OXY IR/ROXICODONE) 5 MG immediate release tablet Take 1 tablet (5 mg total) by mouth every 6 (six) hours as needed for severe pain.  120 tablet  0  . prochlorperazine (COMPAZINE) 10 MG tablet Take 10 mg by mouth every 6 (six) hours as needed.      . ranitidine (ZANTAC) 300 MG tablet Take 1 tablet (300 mg total) by mouth daily.  90 tablet  3   No current facility-administered medications on file prior to visit.     Review of Systems  Constitutional: Negative for fever and chills.  Musculoskeletal:  Positive for arthralgias. Negative for joint swelling.       Hx of arthritis per pt report. Also hx of fibromyalgia  Skin: Positive for rash.  Neurological: Negative for headaches.  All other systems reviewed and are negative.      Objective:  BP 125/81  Pulse 83  Temp(Src) 98.3 F (36.8 C) (Oral)  Resp 14  Ht 5\' 4"  (1.626 m)  Wt 190 lb 8 oz (86.41 kg)  BMI 32.68 kg/m2  SpO2 97%   Physical Exam  Constitutional: She is oriented to person, place, and time. No distress.  Cardiovascular: Normal rate and regular rhythm.   Pulmonary/Chest: Effort normal. No respiratory distress.  Musculoskeletal: Normal range of motion. She exhibits no edema.  Neurological: She is alert and oriented to person, place, and time.  Skin: Rash noted.  Diffuse rash on legs, arms, abdomen  Psychiatric: She has a normal mood and affect. Her behavior is normal. Judgment and thought content normal.      Assessment & Plan:   1. Rash and nonspecific skin eruption Pt does not want to try prednisone taper 2/2 elevated blood glucose levels.  Benadryl every 8 hours for the next 3-4 days. Calamine lotion  or Aveeno body wash - oatmeal RTC if no improvement   2. Arthritis She reports hx of joint pains, fibromyalgia. The ibuprofen is not helping her. Will try meloxicam 7.5 mg daily. Advised not to take any NSAIDs while on this medication. If this helps will send in refills.

## 2014-05-10 NOTE — Patient Instructions (Signed)
  For joint pain start meloxicam 7.5 mg daily. While on this medication you should not take any ibuprofen, aleve or similar medication.  For the rash, take benadryl every 8 hours for the next 3-4 days. Aveeno body wash may help soothe the pain and itching. They have a body wash that is oatmeal wash. This may be helpful.  You can try calamine lotion to the area with rash.  If this does not improve we will need to consider a prednisone taper and adjust your insulin while on the taper.

## 2014-05-12 ENCOUNTER — Encounter: Payer: Self-pay | Admitting: Internal Medicine

## 2014-05-12 MED ORDER — PREDNISONE 10 MG PO TABS: ORAL_TABLET | ORAL | Status: DC

## 2014-05-15 ENCOUNTER — Ambulatory Visit: Payer: Self-pay

## 2014-05-15 LAB — RAPID STREP-A WITH REFLX: Micro Text Report: NEGATIVE

## 2014-05-15 LAB — CBC WITH DIFFERENTIAL/PLATELET
Basophil #: 0.1 10*3/uL (ref 0.0–0.1)
Basophil %: 0.5 %
Eosinophil #: 0.2 10*3/uL (ref 0.0–0.7)
Eosinophil %: 1.7 %
HCT: 40.5 % (ref 35.0–47.0)
HGB: 13.7 g/dL (ref 12.0–16.0)
Lymphocyte #: 1.1 10*3/uL (ref 1.0–3.6)
Lymphocyte %: 7.6 %
MCH: 31.3 pg (ref 26.0–34.0)
MCHC: 33.9 g/dL (ref 32.0–36.0)
MCV: 92 fL (ref 80–100)
Monocyte #: 1.1 x10 3/mm — ABNORMAL HIGH (ref 0.2–0.9)
Monocyte %: 7.9 %
Neutrophil #: 11.7 10*3/uL — ABNORMAL HIGH (ref 1.4–6.5)
Neutrophil %: 82.3 %
Platelet: 216 10*3/uL (ref 150–440)
RBC: 4.38 10*6/uL (ref 3.80–5.20)
RDW: 14 % (ref 11.5–14.5)
WBC: 14.2 10*3/uL — ABNORMAL HIGH (ref 3.6–11.0)

## 2014-05-15 LAB — MONONUCLEOSIS SCREEN: Mono Test: NEGATIVE

## 2014-05-18 ENCOUNTER — Ambulatory Visit (INDEPENDENT_AMBULATORY_CARE_PROVIDER_SITE_OTHER): Payer: BC Managed Care – PPO | Admitting: Endocrinology

## 2014-05-18 ENCOUNTER — Encounter: Payer: Self-pay | Admitting: Endocrinology

## 2014-05-18 VITALS — BP 103/71 | HR 86 | Wt 190.0 lb

## 2014-05-18 DIAGNOSIS — IMO0001 Reserved for inherently not codable concepts without codable children: Secondary | ICD-10-CM

## 2014-05-18 DIAGNOSIS — IMO0002 Reserved for concepts with insufficient information to code with codable children: Secondary | ICD-10-CM

## 2014-05-18 DIAGNOSIS — E1165 Type 2 diabetes mellitus with hyperglycemia: Secondary | ICD-10-CM

## 2014-05-18 DIAGNOSIS — E785 Hyperlipidemia, unspecified: Secondary | ICD-10-CM

## 2014-05-18 LAB — BETA STREP CULTURE(ARMC)

## 2014-05-18 MED ORDER — GLIPIZIDE 5 MG PO TABS: 5.0000 mg | ORAL_TABLET | Freq: Two times a day (BID) | ORAL | Status: DC

## 2014-05-18 MED ORDER — CANAGLIFLOZIN 100 MG PO TABS: 100.0000 mg | ORAL_TABLET | Freq: Every day | ORAL | Status: DC

## 2014-05-18 NOTE — Progress Notes (Signed)
Pre visit review using our clinic review tool, if applicable. No additional management support is needed unless otherwise documented below in the visit note. 

## 2014-05-18 NOTE — Assessment & Plan Note (Signed)
Recent A1c not controlled. Goal around 7% Patient has not tolerated metformin, Levemir in the past. Not sure that her rash is related to Novolog, but she is not using it for the past 2 weeks.  Discussed various treatment options for better glycemic control including Glumetza, newer SU, DPPIV inhibitor, SGLT2 inhibitors, GLP-1 agents versus Lantus/other meal time insulins.  She has elected to try Invokana 100 mg daily. Side effects discussed. She is aware to stay hydrated and return for BMP check in about 10 days.  She wants to try newer SU and I have asked her to stop Glyburide and start Glipizide 5 mg twice daily.  Check sugars three times daily. Bring meter to next appointment.  Recommended follow up with opthalm. Regular foot care.

## 2014-05-18 NOTE — Assessment & Plan Note (Signed)
Recent lipids not controlled. Low fat diet and exercise. She has an appt for discussion of her labs and annual exam in 2 weeks with PCP. She will be a candidate to start statin therapy based on her last lipids.

## 2014-05-18 NOTE — Patient Instructions (Signed)
Check sugars 3 x daily ( before each meal or at bedtime).  Record them in a log book and bring that/meter to next appointment.   Stop Glyburide.  Start Glipizide 5mg  twice daily.  Start Invokana 100 mg daily with breakfast. Stay hydrated with water. Return for lab appt 8/28 to recheck kidney tests.  Please come back for a follow-up appointment in 1 month.

## 2014-05-18 NOTE — Progress Notes (Signed)
Reason for visit-  Kristina Mccoy is a 58 y.o.-year-old female, referred by her PCP,  Jackolyn Confer, MD for management of Type 2 diabetes, uncontrolled, without complications.   HPI- Patient has been diagnosed with diabetes in 2000. Recalls being initially on lifestyle modifications. Following this she was started on oral medications and recalls trying  Metformin, Glyburide, Januvia. Didn't tolerate Metformin due to GI symptoms. Januvia was taken off due to "scare of side effects". She was relatively well controlled till 2014, when she was diagnosed with breast cancer and underwent surgery, followed by chemotherapy and radiation therapy. She reports that chemotherapy and subsequent steroids raised her sugars and she started on levemir and novolog around December 2014. Reports didn't tolerate levemir, and stopped using it about 1 month ago- due to feeling bloated with medication and skin itching. States that it caused her sugars to become elevated. Was using Novolog till about 2 weeks ago, Saturday. Hasn't used it since that Sunday morning when she started having a rash, hives and stopped Novolog due to this. Was taking Levemir 10 units daily in the morning around 10am.  Was taking Novolog on SS at roughly 4 +1 scale,usually needing 6-10 units once a day when sugars were spiking.  Her rash and urticaria is currently being treated with Claritin and calamine lotion. Reports that doesn't want to try steroids for this as they raise her sugar and she has spasms from the steroids. Reports that her sugar was elevated to 400+ recently, when she tried one dose of steroids.  She reports that her breast cancer follow up in due in Sept and she anticipates starting tamoxifen then.    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 By recall  they are:  PREMEAL Breakfast Lunch Dinner Bedtime Overall  Glucose range: 215-281 250-300s 250-300s 250-300s   Mean/median:        Hypoglycemia/Hyperglycemia-  No recent lows. Lowest sugar was 87 last time she took Novolog; she has hypoglycemia awareness at 70.  Highest sugar was 450 recently.   Dietary habits- eats three times daily. Tries to limit carbs, sweetened beverages, sodas, desserts. Not eating sweets and junk foods. Usual foods include Oatmeal, salad, [place] on wheat bread or a sandwich, chicken, veggies. Back to measuring foods.  Exercise- walks for exercise mainly. Had started more intense exercise, but had to stop it recently due to joint pain.  Weight - Recently lost 9 lbs over past month with careful eating.  Wt Readings from Last 3 Encounters:  05/18/14 190 lb (86.183 kg)  05/10/14 190 lb 8 oz (86.41 kg)  05/04/14 189 lb 8 oz (85.957 kg)    Diabetes Complications- No known complications Nephropathy-- No  CKD, GFR 81, last BUN/creatinine- Lab Results  Component Value Date   BUN 12 05/04/2014   CREATININE 0.8 05/04/2014     Lab Results  Component Value Date   MICROALBUR 0.2 05/04/2014   Retinopathy- No, Last DEE- is overdue for this year. Going to make appt for follow up.  Neuropathy-  Reports numbness and tingling in her toes and hands. No known neuropathy.  Associated history - No history of CAD or prior stroke. Has PVD.  No hypothyroidism. her last TSH was  Lab Results  Component Value Date   TSH 0.63 05/04/2014    I reviewed her chart and she also has a history of Sjogren's syndrome, fibromyalgia.    Blood Pressure- Patient's blood pressure is well controlled today.  Hyperlipidemia- her last set of lipids were- Is having appt for physical exam in 2 weeks with PCP.  Lab Results  Component Value Date   CHOL 210* 05/04/2014   HDL 44.90 05/04/2014   LDLCALC 115* 01/24/2014    LDLDIRECT 155.3 05/04/2014   TRIG 239.0* 05/04/2014   CHOLHDL 5 05/04/2014   Pt has FH of DM in mother and grandmother.  I have reviewed the patient's past medical history, family and social history, surgical history, medications and allergies.  Past Medical History  Diagnosis Date  . Otitis media, chronic   . Diabetes mellitus   . Fibromyalgia   . Breast cancer 5/14    left breast  . Lymphedema of arm     right   Past Surgical History  Procedure Laterality Date  . Mastectomy Bilateral 03/23/13  . Breast surgery     History   Social History  . Marital Status: Single    Spouse Name: N/A    Number of Children: N/A  . Years of Education: N/A   Occupational History  . Not on file.   Social History Main Topics  . Smoking status: Former Smoker -- 0.50 packs/day    Types: Cigarettes    Quit date: 02/28/2013  . Smokeless tobacco: Never Used  . Alcohol Use: Yes     Comment: ocassionally  . Drug Use: No  . Sexual Activity: Not on file   Other Topics Concern  . Not on file   Social History Narrative  . No narrative on file   Current Outpatient Prescriptions on File Prior to Visit  Medication Sig Dispense Refill  . ALPRAZolam (XANAX) 0.5 MG tablet Take 1 tablet (0.5 mg total) by mouth 4 (four) times daily as needed for anxiety.  120 tablet  1  . clobetasol ointment (TEMOVATE) 9.41 % Apply 1 application topically 2 (two) times daily.      Marland Kitchen glucose blood (FREESTYLE LITE) test strip Use as instructed  100 each  5  . hyoscyamine (LEVSIN, ANASPAZ) 0.125 MG tablet Take 1 tablet (0.125 mg total) by mouth every 4 (four) hours as needed.  30 tablet  0  . Lancets (FREESTYLE) lancets Use as instructed  100 each  5  . LORAZEPAM PO Take by mouth. Take 2nd for nausea      . meloxicam (MOBIC) 7.5 MG tablet Take 1 tablet (7.5 mg total) by mouth daily.  30 tablet  0  . oxyCODONE (OXY IR/ROXICODONE) 5 MG immediate release tablet Take 1 tablet (5 mg total) by mouth every 6 (six) hours as  needed for severe pain.  120 tablet  0  . predniSONE (DELTASONE) 10 MG tablet Take 60mg  by mouth on day 1, then taper by 10mg  daily until gone  21 tablet  0  . prochlorperazine (COMPAZINE) 10 MG tablet Take 10 mg by mouth every 6 (six) hours as needed.      . ranitidine (ZANTAC) 300 MG tablet Take 1 tablet (300 mg total) by mouth daily.  90 tablet  3   No current facility-administered medications on file prior to visit.   Allergies  Allergen Reactions  . Gabapentin Itching and Swelling  . Pregabalin Swelling    Swelling in arms  and legs   Family History  Problem Relation Age of Onset  . Diabetes Mother   . Heart disease Mother   . Hyperlipidemia Mother   . Heart disease Brother   . Hyperlipidemia Brother   . Diabetes Other      ROS: Review of Systems: [x]  complains of  [  ] denies General:   [  ] Recent weight change [ x ] Fatigue  [  ] Loss of appetite Eyes: [  ]  Vision Difficulty [ x ]  Eye pain ENT: [ x ]  Hearing difficulty [  ]  Difficulty Swallowing CVS: [  ] Chest pain [x  ]  Palpitations/Irregular Heart beat [  ]  Shortness of breath lying flat [  ] Swelling of legs Resp: [  ] Frequent Cough [  ] Shortness of Breath  [  ]  Wheezing GI: [ x ] Heartburn  [  ] Nausea or Vomiting  [  ] Diarrhea [  ] Constipation  [  ] Abdominal Pain GU: [ x ]  Polyuria  [x  ]  nocturia Bones/joints:  [x  ]  Muscle aches  [ x ] Joint Pain  [ x ] Bone pain Skin/Hair/Nails: [ x ]  Rash  [  ] New stretch marks [ x ]  Itching [  ] Hair loss [  ]  Excessive hair growth Reproduction: [  ] Low sexual desire , [  ]  Women: Menstrual cycle problems [  ]  Women: Breast Discharge [  ] Men: Difficulty with erections [  ]  Men: Enlarged Breasts CNS: [  ] Frequent Headaches [  ] Blurry vision [  ] Tremors [  ] Seizures [  ] Loss of consciousness [  ] Localized weakness Endocrine: [x  ]  Excess thirst [  x]  Feeling excessively hot [ x ]  Feeling excessively cold Heme: [  ]  Easy bruising [  ]  Enlarged  glands or lumps in neck Allergy: [  ]  Food allergies [  ] Environmental allergies  PE: BP 103/71  Pulse 86  Wt 190 lb (86.183 kg) Wt Readings from Last 3 Encounters:  05/18/14 190 lb (86.183 kg)  05/10/14 190 lb 8 oz (86.41 kg)  05/04/14 189 lb 8 oz (85.957 kg)   GENERAL: No acute distress, well developed HEENT:  Eye exam shows normal external appearance. Oral exam shows normal mucosa .  NECK:   Neck exam shows no lymphadenopathy. No Carotids bruits. Thyroid is not enlarged and no nodules felt.   LUNGS:         Chest is symmetrical. Lungs are clear to auscultation.Marland Kitchen   HEART:         Heart sounds:  S1 and S2 are normal. No murmurs or clicks heard. ABDOMEN:  No Distention present. Liver and spleen are not palpable. No other mass or tenderness present.  EXTREMITIES:     There is no edema. 2+ DP pulses  NEUROLOGICAL:     Grossly intact.            Diabetic foot exam done with shoes and socks removed: Normal Monofilament testing bilaterally. No toe deformity.  Nails  Dystrophic from chemotherapy. Skin normal color. No open wounds.  MUSCULOSKELETAL:       There is no enlargement or gross deformity of the joints.  SKIN:       No rash  ASSESSMENT:  Problem List Items Addressed This Visit  Other   Other and unspecified hyperlipidemia     Recent lipids not controlled. Low fat diet and exercise. She has an appt for discussion of her labs and annual exam in 2 weeks with PCP. She will be a candidate to start statin therapy based on her last lipids.     Diabetes type 2, uncontrolled - Primary     Recent A1c not controlled. Goal around 7% Patient has not tolerated metformin, Levemir in the past. Not sure that her rash is related to Novolog, but she is not using it for the past 2 weeks.  Discussed various treatment options for better glycemic control including Glumetza, newer SU, DPPIV inhibitor, SGLT2 inhibitors, GLP-1 agents versus Lantus/other meal time insulins.  She has elected to try  Invokana 100 mg daily. Side effects discussed. She is aware to stay hydrated and return for BMP check in about 10 days.  She wants to try newer SU and I have asked her to stop Glyburide and start Glipizide 5 mg twice daily.  Check sugars three times daily. Bring meter to next appointment.  Recommended follow up with opthalm. Regular foot care.     Relevant Medications      glipiZIDE (GLUCOTROL) tablet      Canagliflozin (INVOKANA) 100 MG TABS   Other Relevant Orders      Basic metabolic panel      - Return to clinic in 1 mo with sugar log/meter.  Pascal Stiggers [facility] 05/18/2014 10:16 AM

## 2014-05-27 ENCOUNTER — Encounter: Payer: Self-pay | Admitting: Cardiovascular Disease

## 2014-05-27 ENCOUNTER — Other Ambulatory Visit (INDEPENDENT_AMBULATORY_CARE_PROVIDER_SITE_OTHER): Payer: BC Managed Care – PPO

## 2014-05-27 ENCOUNTER — Ambulatory Visit (INDEPENDENT_AMBULATORY_CARE_PROVIDER_SITE_OTHER): Payer: BC Managed Care – PPO | Admitting: Cardiovascular Disease

## 2014-05-27 VITALS — BP 126/84 | HR 87 | Ht 64.0 in | Wt 186.2 lb

## 2014-05-27 DIAGNOSIS — R0989 Other specified symptoms and signs involving the circulatory and respiratory systems: Secondary | ICD-10-CM

## 2014-05-27 DIAGNOSIS — R0609 Other forms of dyspnea: Secondary | ICD-10-CM

## 2014-05-27 DIAGNOSIS — R0600 Dyspnea, unspecified: Secondary | ICD-10-CM

## 2014-05-27 DIAGNOSIS — IMO0001 Reserved for inherently not codable concepts without codable children: Secondary | ICD-10-CM

## 2014-05-27 DIAGNOSIS — IMO0002 Reserved for concepts with insufficient information to code with codable children: Secondary | ICD-10-CM

## 2014-05-27 DIAGNOSIS — E1165 Type 2 diabetes mellitus with hyperglycemia: Secondary | ICD-10-CM

## 2014-05-27 DIAGNOSIS — R0602 Shortness of breath: Secondary | ICD-10-CM

## 2014-05-27 LAB — BASIC METABOLIC PANEL
BUN: 13 mg/dL (ref 6–23)
CO2: 23 mEq/L (ref 19–32)
Calcium: 9 mg/dL (ref 8.4–10.5)
Chloride: 96 mEq/L (ref 96–112)
Creatinine, Ser: 0.8 mg/dL (ref 0.4–1.2)
GFR: 76.55 mL/min (ref >60.00)
Glucose, Bld: 155 mg/dL — ABNORMAL HIGH (ref 70–99)
Potassium: 4.2 mEq/L (ref 3.5–5.1)
Sodium: 133 mEq/L — ABNORMAL LOW (ref 135–145)

## 2014-05-27 NOTE — Assessment & Plan Note (Signed)
The patient reports worsening exertional dyspnea since treatment of breast cancer with radiation and chemotherapy. By physical exam, there is no evidence of congestive heart failure or fluid overload. I agree with an echocardiogram to make sure there is no evidence of cardiomyopathy or pericardial disease. I ordered an echocardiogram. She also has prolonged history of diabetes and other risk factors for coronary artery disease. I suggested that we proceed with a pharmacologic nuclear stress test. However, the patient does not want to proceed with this at the present time. She is concerned about cost. This can be considered in the future if she continues to have dyspnea with no other explanation.

## 2014-05-27 NOTE — Patient Instructions (Signed)
Your physician has requested that you have an echocardiogram. Echocardiography is a painless test that uses sound waves to create images of your heart. It provides your doctor with information about the size and shape of your heart and how well your heart's chambers and valves are working. This procedure takes approximately one hour. There are no restrictions for this procedure.  Your physician recommends that you schedule a follow-up appointment in:  As needed   

## 2014-05-27 NOTE — Progress Notes (Signed)
Primary care physician: Dr. Gilford Rile  HPI  This is a pleasant 58 year old female who was referred for evaluation of exertional dyspnea. She has no previous cardiac history. She reports previous cardiac evaluation in 2009 by Dr. Humphrey Rolls for atypical chest pain. Echocardiogram and nuclear stress test were both unremarkable. She has chronic medical conditions that include diabetes which has not been well-controlled as well as hyperlipidemia. She was diagnosed with breast cancer this year and underwent bilateral mastectomy followed by radiation therapy and chemotherapy. Echocardiogram before starting treatment showed normal LV systolic function. She reports gradually worsening of exertional dyspnea without chest discomfort. No orthopnea, PND or lower extremity edema. She quit smoking in 2014. She has no family history of premature coronary artery disease although there is family history of valvular heart disease.  Allergies  Allergen Reactions  . Gabapentin Itching and Swelling  . Pregabalin Swelling    Swelling in arms and legs     Current Outpatient Prescriptions on File Prior to Visit  Medication Sig Dispense Refill  . ALPRAZolam (XANAX) 0.5 MG tablet Take 1 tablet (0.5 mg total) by mouth 4 (four) times daily as needed for anxiety.  120 tablet  1  . Canagliflozin (INVOKANA) 100 MG TABS Take 1 tablet (100 mg total) by mouth daily with breakfast.  30 tablet  6  . clobetasol ointment (TEMOVATE) 3.22 % Apply 1 application topically 2 (two) times daily.      Marland Kitchen glipiZIDE (GLUCOTROL) 5 MG tablet Take 1 tablet (5 mg total) by mouth 2 (two) times daily before a meal.  60 tablet  3  . glucose blood (FREESTYLE LITE) test strip Use as instructed  100 each  5  . hyoscyamine (LEVSIN, ANASPAZ) 0.125 MG tablet Take 1 tablet (0.125 mg total) by mouth every 4 (four) hours as needed.  30 tablet  0  . Lancets (FREESTYLE) lancets Use as instructed  100 each  5  . LORAZEPAM PO Take by mouth. Take 2nd for nausea        . meloxicam (MOBIC) 7.5 MG tablet Take 1 tablet (7.5 mg total) by mouth daily.  30 tablet  0  . oxyCODONE (OXY IR/ROXICODONE) 5 MG immediate release tablet Take 1 tablet (5 mg total) by mouth every 6 (six) hours as needed for severe pain.  120 tablet  0  . predniSONE (DELTASONE) 10 MG tablet Take 60mg  by mouth on day 1, then taper by 10mg  daily until gone  21 tablet  0  . prochlorperazine (COMPAZINE) 10 MG tablet Take 10 mg by mouth every 6 (six) hours as needed.      . ranitidine (ZANTAC) 300 MG tablet Take 1 tablet (300 mg total) by mouth daily.  90 tablet  3   No current facility-administered medications on file prior to visit.     Past Medical History  Diagnosis Date  . Otitis media, chronic   . Diabetes mellitus   . Fibromyalgia   . Breast cancer 5/14    left breast  . Lymphedema of arm     right  . Hyperlipidemia   . Heart murmur      Past Surgical History  Procedure Laterality Date  . Mastectomy Bilateral 03/23/13  . Breast surgery       Family History  Problem Relation Age of Onset  . Diabetes Mother   . Heart disease Mother   . Hyperlipidemia Mother   . Heart failure Mother   . Heart disease Brother   . Hyperlipidemia Brother   .  Diabetes Other      History   Social History  . Marital Status: Single    Spouse Name: N/A    Number of Children: N/A  . Years of Education: N/A   Occupational History  . Not on file.   Social History Main Topics  . Smoking status: Former Smoker -- 0.50 packs/day    Types: Cigarettes    Quit date: 02/28/2013  . Smokeless tobacco: Never Used  . Alcohol Use: No     Comment: ocassionally  . Drug Use: No  . Sexual Activity: Not on file   Other Topics Concern  . Not on file   Social History Narrative  . No narrative on file     ROS A 10 point review of system was performed. It is negative other than that mentioned in the history of present illness.   PHYSICAL EXAM   BP 126/84  Pulse 87  Ht 5\' 4"  (1.626 m)   Wt 186 lb 4 oz (84.482 kg)  BMI 31.95 kg/m2 Constitutional: She is oriented to person, place, and time. She appears well-developed and well-nourished. No distress.  HENT: No nasal discharge.  Head: Normocephalic and atraumatic.  Eyes: Pupils are equal and round. No discharge.  Neck: Normal range of motion. Neck supple. No JVD present. No thyromegaly present.  Cardiovascular: Normal rate, regular rhythm, normal heart sounds. Exam reveals no gallop and no friction rub. No murmur heard.  Pulmonary/Chest: Effort normal and breath sounds normal. No stridor. No respiratory distress. She has no wheezes. She has no rales. She exhibits no tenderness.  Abdominal: Soft. Bowel sounds are normal. She exhibits no distension. There is no tenderness. There is no rebound and no guarding.  Musculoskeletal: Normal range of motion. She exhibits no edema and no tenderness.  Neurological: She is alert and oriented to person, place, and time. Coordination normal.  Skin: Skin is warm and dry. No rash noted. She is not diaphoretic. No erythema. No pallor.  Psychiatric: She has a normal mood and affect. Her behavior is normal. Judgment and thought content normal.     [id number]  Rhythm  Low voltage in precordial leads.   ABNORMAL    ASSESSMENT AND PLAN

## 2014-05-30 ENCOUNTER — Encounter: Payer: Self-pay | Admitting: *Deleted

## 2014-06-02 ENCOUNTER — Encounter: Payer: Self-pay | Admitting: Internal Medicine

## 2014-06-02 ENCOUNTER — Ambulatory Visit (INDEPENDENT_AMBULATORY_CARE_PROVIDER_SITE_OTHER): Payer: BC Managed Care – PPO | Admitting: Internal Medicine

## 2014-06-02 VITALS — BP 108/70 | HR 101 | Temp 97.9°F | Ht 64.5 in | Wt 184.8 lb

## 2014-06-02 DIAGNOSIS — IMO0001 Reserved for inherently not codable concepts without codable children: Secondary | ICD-10-CM

## 2014-06-02 DIAGNOSIS — L500 Allergic urticaria: Secondary | ICD-10-CM | POA: Insufficient documentation

## 2014-06-02 DIAGNOSIS — M359 Systemic involvement of connective tissue, unspecified: Secondary | ICD-10-CM | POA: Insufficient documentation

## 2014-06-02 DIAGNOSIS — M797 Fibromyalgia: Secondary | ICD-10-CM

## 2014-06-02 DIAGNOSIS — Z0000 Encounter for general adult medical examination without abnormal findings: Secondary | ICD-10-CM | POA: Insufficient documentation

## 2014-06-02 DIAGNOSIS — F411 Generalized anxiety disorder: Secondary | ICD-10-CM

## 2014-06-02 LAB — SEDIMENTATION RATE: Sed Rate: 56 mm/hr — ABNORMAL HIGH (ref 0–22)

## 2014-06-02 LAB — C-REACTIVE PROTEIN: CRP: 8.1 mg/dL (ref 0.5–20.0)

## 2014-06-02 MED ORDER — RANITIDINE HCL 300 MG PO TABS: 300.0000 mg | ORAL_TABLET | Freq: Every day | ORAL | Status: DC

## 2014-06-02 MED ORDER — OXYCODONE HCL 5 MG PO TABS: 5.0000 mg | ORAL_TABLET | Freq: Four times a day (QID) | ORAL | Status: DC | PRN

## 2014-06-02 MED ORDER — ALPRAZOLAM 0.5 MG PO TABS
0.5000 mg | ORAL_TABLET | Freq: Four times a day (QID) | ORAL | Status: DC | PRN
Start: 2014-06-02 — End: 2014-08-16

## 2014-06-02 MED ORDER — HYDROXYZINE HCL 10 MG PO TABS: 10.0000 mg | ORAL_TABLET | Freq: Three times a day (TID) | ORAL | Status: DC | PRN

## 2014-06-02 NOTE — Assessment & Plan Note (Signed)
Will set up rheumatology evaluation. Will start Atarax to see if any improvement in symptoms.

## 2014-06-02 NOTE — Progress Notes (Signed)
Subjective:    Patient ID: Kristina Mccoy. Tracht[place], female    DOB: Feb 27, 1956, 58 y.o.   MRN: [id number]  HPI 58YO female presents for annual exam.  Hives - developed beginning of August. Started on Claritin and Zyrtec. Started Prednisone 8/13 because of persistent symptoms. On this medication, had improvement in hives, but developed crampy lower abdominal pain and markedly elevated BG. Evaluated by Dr. Ruthell Rummage. No clear diagnosis made. Continues ot have intermittent hives and itching. Taking Benadryl with no improvement. On one occasion had hives and some trouble swallowing. No fever, chills. No dyspnea out of baseline. Denies use of any new supplements. Started recently on Invokana, however this was after hives. Using dye free soaps and detergents.  Also developed worsening diffuse bony pain around same time as hives. Notes occasional tripping bilateral lower extremities. Has pain in bilateral knees, wrists. Started on Meloxicam. No improvement. In the past, diagnosed with possible Sjogrens'. However, never treated with immune modulating agents.  Review of Systems  Constitutional: Positive for fatigue. Negative for fever, chills, appetite change and unexpected weight change.  HENT: Positive for trouble swallowing (on one occasion during hives). Negative for congestion, postnasal drip, rhinorrhea, sore throat and voice change.   Eyes: Negative for visual disturbance.  Respiratory: Negative for shortness of breath.   Cardiovascular: Negative for chest pain and leg swelling.  Gastrointestinal: Negative for nausea, vomiting, abdominal pain, diarrhea and constipation.  Musculoskeletal: Positive for arthralgias, back pain and myalgias.  Skin: Positive for color change and rash.  Neurological: Positive for weakness. Negative for numbness.  Hematological: Negative for adenopathy. Does not bruise/bleed easily.  Psychiatric/Behavioral: Negative for dysphoric mood. The patient is not nervous/anxious.        Objective:    BP 108/70  Pulse 101  Temp(Src) 97.9 F (36.6 C) (Oral)  Ht 5' 4.5" (1.638 m)  Wt 184 lb 12 oz (83.802 kg)  BMI 31.23 kg/m2  SpO2 94% Physical Exam  Constitutional: She is oriented to person, place, and time. She appears well-developed and well-nourished. No distress.  HENT:  Head: Normocephalic and atraumatic.  Right Ear: External ear normal.  Left Ear: External ear normal.  Nose: Nose normal.  Mouth/Throat: Oropharynx is clear and moist. No oropharyngeal exudate.  Eyes: Conjunctivae are normal. Pupils are equal, round, and reactive to light. Right eye exhibits no discharge. Left eye exhibits no discharge. No scleral icterus.  Neck: Normal range of motion. Neck supple. No tracheal deviation present. No thyromegaly present.  Cardiovascular: Normal rate, regular rhythm, normal heart sounds and intact distal pulses.  Exam reveals no gallop and no friction rub.   No murmur heard. Pulmonary/Chest: Effort normal and breath sounds normal. No accessory muscle usage. Not tachypneic. No respiratory distress. She has no decreased breath sounds. She has no wheezes. She has no rales. She exhibits no tenderness. Right breast exhibits no inverted nipple, no mass, no skin change and no tenderness. Left breast exhibits no inverted nipple, no mass, no skin change and no tenderness. Breasts are symmetrical.    Abdominal: Soft. Bowel sounds are normal. She exhibits no distension and no mass. There is no tenderness. There is no rebound and no guarding.  Musculoskeletal: Normal range of motion. She exhibits no edema and no tenderness.  Lymphadenopathy:    She has no cervical adenopathy.  Neurological: She is alert and oriented to person, place, and time. No cranial nerve deficit. She exhibits normal muscle tone. Coordination normal.  Skin: Skin is warm and dry. Rash noted. Rash  is urticarial (over arms and legs). She is not diaphoretic. There is erythema. No pallor.  Psychiatric: She has a  normal mood and affect. Her behavior is normal. Judgment and thought content normal.          Assessment & Plan:   Problem List Items Addressed This Visit     Unprioritized   Allergic urticaria     Will set up rheumatology evaluation. Will start Atarax to see if any improvement in symptoms.    Relevant Medications      hydrOXYzine (ATARAX/VISTARIL) tablet   Anxiety state, unspecified   Relevant Medications      hydrOXYzine (ATARAX/VISTARIL) tablet      ALPRAZolam (XANAX) tablet   Autoimmune disease     Diagnosed in past with possible Sjorgren's and Fibromyalgia. Progressive symptoms of diffuse arthralgia, now with urticaria. Will recheck ANA, RF, ESR, CRP, complement. Will get CXR. Discussed referral for second opinion with Dr. Boris Lown. Follow up 4 weeks and prn.    Relevant Orders      Cardiolipin antibodies, IgG, IgM, IgA      Antinuclear Antib (ANA)      Beta 2 microglobulin, serum      ENA 9 Panel      Sedimentation rate      C-reactive protein      Rheumatoid Factor      DG Chest 2 View      Sjogrens syndrome-A extractable nuclear antibody      Sjogrens syndrome-B extractable nuclear antibody      Ambulatory referral to Rheumatology   Fibromyalgia   Routine general medical examination at a health care facility - Primary     General medical exam including breast exam normal except as noted. Recent labs including CBC, CMP, lipids stable. Flu vaccine declined. Mammogram deferred as s/p mastectomy. PAP completed 2014 normal per pt.        Return in about 56 weeks (around 06/30/2014).

## 2014-06-02 NOTE — Assessment & Plan Note (Signed)
Diagnosed in past with possible Sjorgren's and Fibromyalgia. Progressive symptoms of diffuse arthralgia, now with urticaria. Will recheck ANA, RF, ESR, CRP, complement. Will get CXR. Discussed referral for second opinion with Dr. Boris Lown. Follow up 4 weeks and prn.

## 2014-06-02 NOTE — Progress Notes (Signed)
Pre visit review using our clinic review tool, if applicable. No additional management support is needed unless otherwise documented below in the visit note. 

## 2014-06-02 NOTE — Patient Instructions (Signed)

## 2014-06-02 NOTE — Assessment & Plan Note (Signed)
General medical exam including breast exam normal except as noted. Recent labs including CBC, CMP, lipids stable. Flu vaccine declined. Mammogram deferred as s/p mastectomy. PAP completed 2014 normal per pt.

## 2014-06-03 LAB — CARDIOLIPIN ANTIBODIES, IGG, IGM, IGA
Anticardiolipin IgA: 15 APL U/mL (ref <22)
Anticardiolipin IgG: 8 GPL U/mL (ref <23)
Anticardiolipin IgM: 1 MPL U/mL (ref <11)

## 2014-06-03 LAB — SJOGRENS SYNDROME-B EXTRACTABLE NUCLEAR ANTIBODY: SSB (La) (ENA) Antibody, IgG: <1

## 2014-06-03 LAB — ENA 9 PANEL
Centromere Ab Screen: <1
ENA SM Ab Ser-aCnc: <1
Jo-1 Antibody, IgG: <1
Ribosomal P Protein Ab: <1
SM/RNP: <1
SSA (Ro) (ENA) Antibody, IgG: 5.1 — ABNORMAL HIGH
SSB (La) (ENA) Antibody, IgG: <1
Scleroderma (Scl-70) (ENA) Antibody, IgG: <1
ds DNA Ab: <1 IU/mL

## 2014-06-03 LAB — ANTI-NUCLEAR AB-TITER (ANA TITER): ANA Titer 1: NEGATIVE " "

## 2014-06-03 LAB — RHEUMATOID FACTOR: Rhuematoid fact SerPl-aCnc: 10 IU/mL (ref <=14)

## 2014-06-03 LAB — ANA: Anti Nuclear Antibody(ANA): POSITIVE — AB

## 2014-06-03 LAB — SJOGRENS SYNDROME-A EXTRACTABLE NUCLEAR ANTIBODY: SSA (Ro) (ENA) Antibody, IgG: 5.1 — ABNORMAL HIGH

## 2014-06-07 LAB — BETA 2 MICROGLOBULIN, SERUM: Beta-2 Microglobulin: 3.18 mg/L — ABNORMAL HIGH (ref <=2.51)

## 2014-06-09 ENCOUNTER — Other Ambulatory Visit (INDEPENDENT_AMBULATORY_CARE_PROVIDER_SITE_OTHER): Payer: BC Managed Care – PPO

## 2014-06-09 ENCOUNTER — Other Ambulatory Visit: Payer: Self-pay

## 2014-06-09 DIAGNOSIS — R0602 Shortness of breath: Secondary | ICD-10-CM

## 2014-06-10 ENCOUNTER — Encounter: Payer: Self-pay | Admitting: Internal Medicine

## 2014-06-14 ENCOUNTER — Ambulatory Visit (INDEPENDENT_AMBULATORY_CARE_PROVIDER_SITE_OTHER): Payer: BC Managed Care – PPO | Admitting: Endocrinology

## 2014-06-14 ENCOUNTER — Encounter: Payer: Self-pay | Admitting: Endocrinology

## 2014-06-14 VITALS — BP 106/60 | HR 73 | Ht 64.5 in | Wt 186.0 lb

## 2014-06-14 DIAGNOSIS — E785 Hyperlipidemia, unspecified: Secondary | ICD-10-CM

## 2014-06-14 DIAGNOSIS — E1165 Type 2 diabetes mellitus with hyperglycemia: Secondary | ICD-10-CM

## 2014-06-14 DIAGNOSIS — IMO0001 Reserved for inherently not codable concepts without codable children: Secondary | ICD-10-CM

## 2014-06-14 DIAGNOSIS — IMO0002 Reserved for concepts with insufficient information to code with codable children: Secondary | ICD-10-CM

## 2014-06-14 NOTE — Patient Instructions (Signed)
The current medical regimen is effective;  continue present plan and medications.  Work on diet - sugars are better when your diet is.   Please come back for a follow-up appointment in 2 months.

## 2014-06-14 NOTE — Assessment & Plan Note (Signed)
Recent A1c not controlled. Goal around 7% Sugars are improving with better dietary control and recent change in medications.  They are more elevated this week, when she has been eating more comfort foods. Discussed increasing Invokana versus working on diet and the patient prefers to work on her diet. She is aware to notify me if her sugars are still staying elevated inspite of this.   Continue current Glipizide and Invokana. Stay hydrated.  Check sugars 2-3 times daily.  Bring meter and log to next appointment.  Recommended follow up with opthalm. Regular foot care.

## 2014-06-14 NOTE — Assessment & Plan Note (Signed)
Recent lipids not controlled. Low fat diet and exercise.  She will be a candidate to start statin therapy based on her last lipids. However, she is dealing with rheumatological issues now, so I would prefer for her to start statin therapy once she gets better with symptoms of joint and muscle aches. Will reassess at next visit.

## 2014-06-14 NOTE — Progress Notes (Signed)
Reason for visit-  Kristina Mccoy. Kristina Mccoy is a 58 y.o.-year-old female, for followup management of Type 2 diabetes, uncontrolled, without complications ( diagnosed 2000). Last seen by me 1 month ago.   HPI- Diagnosed in 2000. Tried Metformin, Glyburide, Januvia in the past.  -Didn't tolerate Metformin due to GI symptoms.  -Januvia was taken off due to "scare of side effects".  - was on Levemir/Novolog around Dec 2014 following increase in sugars after breast cancer surgery -Felt bloated with Levemir along with skin itching -? Intolerance to Novolog due to rash, hives, which are still persistent even after stopping Novolog.  -Glyburide taken off in August 2015 and switched to Glipizide ( newer SU)  Pt is currently on a regimen of: -Glipizide 5 mg twice daily -Invokana 100 mg daily ( start 8/15)  Last hemoglobin A1c was: Lab Results  Component Value Date   HGBA1C 10.0* 05/04/2014   HGBA1C 9.4* 01/24/2014   HGBA1C 8.7* 10/04/2013    Pt checks her sugars 1-3 a day . Uses Bayer Contour next [place] glucometer. By meter review they are:  PREMEAL Breakfast Lunch Dinner Bedtime Overall  Glucose range: 165-224 160s-250 127-187    Mean/median:        Hypoglycemia/Hyperglycemia-  No recent lows; she has hypoglycemia awareness at 70.  Sugars are improving except for last week.  Dietary habits- eats three times daily. Tries to limit carbs, sweetened beverages, sodas, desserts. Not eating sweets and junk foods. Was doing good with dietary efforts till last week when she started eating comfort foods again due to ongoing issues with joint and muscle aches and rashes.  Exercise- walks for exercise mainly. Had started more intense exercise, but had to stop it recently due to joint pain.  Weight - stable recently  Wt Readings from Last 3 Encounters:  06/14/14 186 lb (84.369 kg)  06/02/14 184 lb 12 oz (83.802 kg)  05/27/14 186 lb 4 oz (84.482 kg)    Diabetes Complications- No known  complications Nephropathy-- No  CKD, GFR 76-81, last BUN/creatinine- Lab Results  Component Value Date   BUN 13 05/27/2014   CREATININE 0.8 05/27/2014     Lab Results  Component Value Date   MICROALBUR 0.2 05/04/2014   Retinopathy- No, Last DEE- is overdue for this year. Had to reschedule appt due to acute issues.   Neuropathy-  Reports occasional numbness and tingling in her toes and hands. No known neuropathy.  Associated history - No history of CAD. No prior stroke. Has PVD. No hypothyroidism. her last TSH was  Lab Results  Component Value Date   TSH 0.63 05/04/2014     Blood Pressure- Patient's blood pressure is well controlled today.  Hyperlipidemia- her last set of lipids were uncontrolled- not on a statin  Lab Results  Component Value Date   CHOL 210* 05/04/2014   HDL 44.90 05/04/2014   LDLCALC 115* 01/24/2014   LDLDIRECT 155.3 05/04/2014   TRIG 239.0* 05/04/2014   CHOLHDL 5 05/04/2014   I have reviewed the patient's past medical history, family and social history, surgical history, medications and allergies.    Current Outpatient Prescriptions on File Prior to Visit  Medication Sig Dispense Refill  . ALPRAZolam (XANAX) 0.5 MG tablet Take 1 tablet (0.5 mg total) by mouth 4 (four) times daily as needed for anxiety.  120 tablet  1  . Canagliflozin (INVOKANA) 100 MG TABS Take 1 tablet (100 mg total) by mouth daily with breakfast.  30 tablet  6  . glipiZIDE (GLUCOTROL)  5 MG tablet Take 1 tablet (5 mg total) by mouth 2 (two) times daily before a meal.  60 tablet  3  . glucose blood (FREESTYLE LITE) test strip Use as instructed  100 each  5  . hydrOXYzine (ATARAX/VISTARIL) 10 MG tablet Take 1 tablet (10 mg total) by mouth 3 (three) times daily as needed.  30 tablet  0  . hyoscyamine (LEVSIN, ANASPAZ) 0.125 MG tablet Take 1 tablet (0.125 mg total) by mouth every 4 (four) hours as needed.  30 tablet  0  . Lancets (FREESTYLE) lancets Use as instructed  100 each  5  . LORAZEPAM PO Take by  mouth. Take 2nd for nausea      . oxyCODONE (OXY IR/ROXICODONE) 5 MG immediate release tablet Take 1 tablet (5 mg total) by mouth every 6 (six) hours as needed for severe pain.  120 tablet  0  . prochlorperazine (COMPAZINE) 10 MG tablet Take 10 mg by mouth every 6 (six) hours as needed.      . ranitidine (ZANTAC) 300 MG tablet Take 1 tablet (300 mg total) by mouth daily.  90 tablet  3  . clobetasol ointment (TEMOVATE) 9.37 % Apply 1 application topically 2 (two) times daily.       No current facility-administered medications on file prior to visit.   Allergies  Allergen Reactions  . Gabapentin Itching and Swelling  . Pregabalin Swelling    Swelling in arms and legs    ROS: Review of Systems- [ x ]  Complains of    [  ]  denies [  ] Recent weight change [ x ]  Fatigue [ x ] polydipsia [ x ] polyuria [ x ]  nocturia [  ]  vision difficulty [  ] chest pain [  ] shortness of breath [  ] leg swelling [  ] cough [  ] nausea/vomiting [  ] diarrhea [  ] constipation [  ] abdominal pain [ x ]  tingling/numbness in extremities [  ]  concern with feet ( wounds/sores)   PE: BP 106/60  Pulse 73  Ht 5' 4.5" (1.638 m)  Wt 186 lb (84.369 kg)  BMI 31.45 kg/m2  SpO2 97% Wt Readings from Last 3 Encounters:  06/14/14 186 lb (84.369 kg)  06/02/14 184 lb 12 oz (83.802 kg)  05/27/14 186 lb 4 oz (84.482 kg)   Exam Deferred this visit  ASSESSMENT: 1. Diabetes mellitus 2. Hyperlipidemia  Problem List Items Addressed This Visit     Other   Other and unspecified hyperlipidemia - Primary     Recent lipids not controlled. Low fat diet and exercise.  She will be a candidate to start statin therapy based on her last lipids. However, she is dealing with rheumatological issues now, so I would prefer for her to start statin therapy once she gets better with symptoms of joint and muscle aches. Will reassess at next visit.       Diabetes type 2, uncontrolled     Recent A1c not controlled. Goal around  7% Sugars are improving with better dietary control and recent change in medications.  They are more elevated this week, when she has been eating more comfort foods. Discussed increasing Invokana versus working on diet and the patient prefers to work on her diet. She is aware to notify me if her sugars are still staying elevated inspite of this.   Continue current Glipizide and Invokana. Stay hydrated.  Check sugars 2-3 times daily.  Bring meter and log to next appointment.  Recommended follow up with opthalm. Regular foot care.          - Return to clinic in 2 mo with sugar log/meter.  Shalik Sanfilippo [facility] 06/14/2014 10:28 AM

## 2014-06-14 NOTE — Progress Notes (Signed)
Pre visit review using our clinic review tool, if applicable. No additional management support is needed unless otherwise documented below in the visit note. 

## 2014-06-22 ENCOUNTER — Telehealth: Payer: Self-pay | Admitting: *Deleted

## 2014-06-22 ENCOUNTER — Encounter: Payer: Self-pay | Admitting: Endocrinology

## 2014-06-22 ENCOUNTER — Other Ambulatory Visit: Payer: Self-pay | Admitting: *Deleted

## 2014-06-22 MED ORDER — FLUCONAZOLE 150 MG PO TABS: 150.0000 mg | ORAL_TABLET | Freq: Every day | ORAL | Status: DC

## 2014-06-22 NOTE — Telephone Encounter (Signed)
Fine to send Diflucan 150mg  po weekly x 2 weeks. #2

## 2014-06-22 NOTE — Telephone Encounter (Signed)
Pt states that she has a yeast infection, she states that Dr Howell Rucks informed her prior to starting Invokata that the medication could cause a yeast infection.  Pt is asking for 2 doses of diflucan sent to pharmacy.

## 2014-06-22 NOTE — Telephone Encounter (Signed)
Rx sent to pharmacy   

## 2014-06-23 ENCOUNTER — Other Ambulatory Visit: Payer: Self-pay | Admitting: Internal Medicine

## 2014-06-23 ENCOUNTER — Encounter: Payer: Self-pay | Admitting: Internal Medicine

## 2014-06-23 DIAGNOSIS — L500 Allergic urticaria: Secondary | ICD-10-CM

## 2014-06-23 MED ORDER — HYDROXYZINE HCL 10 MG PO TABS: 10.0000 mg | ORAL_TABLET | Freq: Three times a day (TID) | ORAL | Status: DC | PRN

## 2014-07-07 ENCOUNTER — Ambulatory Visit (INDEPENDENT_AMBULATORY_CARE_PROVIDER_SITE_OTHER): Payer: BC Managed Care – PPO | Admitting: Internal Medicine

## 2014-07-07 ENCOUNTER — Encounter: Payer: Self-pay | Admitting: Internal Medicine

## 2014-07-07 ENCOUNTER — Other Ambulatory Visit: Payer: Self-pay | Admitting: Internal Medicine

## 2014-07-07 ENCOUNTER — Ambulatory Visit (INDEPENDENT_AMBULATORY_CARE_PROVIDER_SITE_OTHER)
Admission: RE | Admit: 2014-07-07 | Discharge: 2014-07-07 | Disposition: A | Discharge status: Home / Self Care | Payer: BC Managed Care – PPO | Source: Ambulatory Visit | Attending: Internal Medicine | Admitting: Internal Medicine

## 2014-07-07 VITALS — BP 102/70 | HR 84 | Temp 97.6°F | Ht 64.5 in | Wt 185.5 lb

## 2014-07-07 DIAGNOSIS — F4323 Adjustment disorder with mixed anxiety and depressed mood: Secondary | ICD-10-CM

## 2014-07-07 DIAGNOSIS — L500 Allergic urticaria: Secondary | ICD-10-CM

## 2014-07-07 DIAGNOSIS — R9389 Abnormal findings on diagnostic imaging of other specified body structures: Secondary | ICD-10-CM

## 2014-07-07 DIAGNOSIS — M359 Systemic involvement of connective tissue, unspecified: Secondary | ICD-10-CM

## 2014-07-07 DIAGNOSIS — R0600 Dyspnea, unspecified: Secondary | ICD-10-CM

## 2014-07-07 DIAGNOSIS — Z13820 Encounter for screening for osteoporosis: Secondary | ICD-10-CM

## 2014-07-07 MED ORDER — PREDNISONE 10 MG PO TABS: 10.0000 mg | ORAL_TABLET | Freq: Every day | ORAL | Status: DC

## 2014-07-07 MED ORDER — PAROXETINE HCL 10 MG PO TABS: 10.0000 mg | ORAL_TABLET | Freq: Every day | ORAL | Status: DC

## 2014-07-07 NOTE — Progress Notes (Signed)
Subjective:    Patient ID: Kristina Mccoy, female    DOB: 11/10/1955, 58 y.o.   MRN: [id number]  HPI 58YO female presents for follow up.  Generally not feeling well. Has mostly been in bed since last visit. Joint pain much worse after chemotherapy.  Wakes her out of sleep at night. Described as deep, bony pain. Also has some muscular pain. Last night, took Advil and 1 oxycodone with no improvement in pain. Simple tasks are exhausting. Also has severe pain in left knee, with feeling like "drilling" into her knee. Tried Neurontin and Lyrica in the past, but had reactions to meds. Also tried Cymbalta with no improvement.  Continues to have intermittent hives. Diagnosed with urticaria. Treated with Hydroxyzine and Zyrtec, and topical cream.  Also feeling short of breath, particularly in the evening. Recent ECHO was normal. Has not yet gone for xray ordered at last visit. No chest pain, cough, fever, chills.  Review of Systems  Constitutional: Negative for fever, chills, appetite change, fatigue and unexpected weight change.  Eyes: Negative for visual disturbance.  Respiratory: Positive for shortness of breath. Negative for cough and wheezing.   Cardiovascular: Negative for chest pain, palpitations and leg swelling.  Gastrointestinal: Negative for nausea, vomiting, abdominal pain, diarrhea and constipation.  Musculoskeletal: Positive for arthralgias, back pain, gait problem, myalgias and neck pain.  Skin: Negative for color change and rash.  Neurological: Negative for weakness, numbness and headaches.  Hematological: Negative for adenopathy. Does not bruise/bleed easily.  Psychiatric/Behavioral: Positive for sleep disturbance and dysphoric mood. Negative for suicidal ideas. The patient is not nervous/anxious.        Objective:    BP 102/70  Pulse 84  Temp(Src) 97.6 F (36.4 C) (Oral)  Ht 5' 4.5" (1.638 m)  Wt 185 lb 8 oz (84.142 kg)  BMI 31.36 kg/m2  SpO2 97% Physical Exam    Constitutional: She is oriented to person, place, and time. She appears well-developed and well-nourished. No distress.  HENT:  Head: Normocephalic and atraumatic.  Right Ear: External ear normal.  Left Ear: External ear normal.  Nose: Nose normal.  Mouth/Throat: Oropharynx is clear and moist. No oropharyngeal exudate.  Eyes: Conjunctivae are normal. Pupils are equal, round, and reactive to light. Right eye exhibits no discharge. Left eye exhibits no discharge. No scleral icterus.  Neck: Normal range of motion. Neck supple. No tracheal deviation present. No thyromegaly present.  Cardiovascular: Normal rate, regular rhythm, normal heart sounds and intact distal pulses.  Exam reveals no gallop and no friction rub.   No murmur heard. Pulmonary/Chest: Effort normal and breath sounds normal. No accessory muscle usage. Not tachypneic. No respiratory distress. She has no decreased breath sounds. She has no wheezes. She has no rhonchi. She has no rales. She exhibits no tenderness.  Musculoskeletal: Normal range of motion. She exhibits no edema and no tenderness.       Left knee: She exhibits normal range of motion and no swelling.  Diffuse complaint of muscle and joint pain, however no palpable abnormalities noted on exam.  Lymphadenopathy:    She has no cervical adenopathy.  Neurological: She is alert and oriented to person, place, and time. No cranial nerve deficit. She exhibits normal muscle tone. Coordination normal.  Skin: Skin is warm and dry. No rash noted. She is not diaphoretic. No erythema. No pallor.  Psychiatric: She has a normal mood and affect. Her behavior is normal. Judgment and thought content normal.  Assessment & Plan:   Problem List Items Addressed This Visit     Unprioritized   Adjustment disorder with mixed anxiety and depressed mood     Symptoms of anxiety and depression related to chronic pain. Will start Paroxetine 10mg  daily. Follow up in 4 weeks.     Allergic urticaria     Symptoms persistent. She was seen by dermatologist and continues on Zyrtec and Atarax. Will start short course of prednisone to see if any improvement.    Autoimmune disease - Primary     Chronic arthralgia and myalgia. Labs remarkable for pos ANA and SSA. Will set up further evaluation with rheumatology. Question Sjogren's syndrome.    Dyspnea     Persistent symptoms. Exam normal. CXR today. Recent ECHO was normal. ACE level was noted to be elevated.    Screening for osteoporosis   Relevant Orders      DG Bone Density       No Follow-up on file.

## 2014-07-07 NOTE — Assessment & Plan Note (Signed)
Symptoms persistent. She was seen by dermatologist and continues on Zyrtec and Atarax. Will start short course of prednisone to see if any improvement.

## 2014-07-07 NOTE — Progress Notes (Signed)
Pre visit review using our clinic review tool, if applicable. No additional management support is needed unless otherwise documented below in the visit note. 

## 2014-07-07 NOTE — Assessment & Plan Note (Signed)
Chronic arthralgia and myalgia. Labs remarkable for pos ANA and SSA. Will set up further evaluation with rheumatology. Question Sjogren's syndrome.

## 2014-07-07 NOTE — Assessment & Plan Note (Signed)
Persistent symptoms. Exam normal. CXR today. Recent ECHO was normal. ACE level was noted to be elevated.

## 2014-07-07 NOTE — Assessment & Plan Note (Signed)
Symptoms of anxiety and depression related to chronic pain. Will start Paroxetine 10mg  daily. Follow up in 4 weeks.

## 2014-07-07 NOTE — Patient Instructions (Addendum)
Try starting Prednisone 10mg  daily for 5 days.  Start Paxil 10mg  daily to help with symptoms.  Email with update next week.

## 2014-07-11 ENCOUNTER — Ambulatory Visit: Payer: Self-pay | Admitting: Internal Medicine

## 2014-07-12 ENCOUNTER — Telehealth: Payer: Self-pay | Admitting: Internal Medicine

## 2014-07-12 NOTE — Telephone Encounter (Signed)
CT chest showed scaring right lung, but no other abnormalities.

## 2014-07-18 ENCOUNTER — Encounter: Payer: Self-pay | Admitting: Endocrinology

## 2014-07-21 ENCOUNTER — Encounter: Payer: Self-pay | Admitting: Endocrinology

## 2014-07-21 ENCOUNTER — Encounter: Payer: Self-pay | Admitting: Internal Medicine

## 2014-07-21 ENCOUNTER — Ambulatory Visit (INDEPENDENT_AMBULATORY_CARE_PROVIDER_SITE_OTHER): Payer: BC Managed Care – PPO | Admitting: Endocrinology

## 2014-07-21 ENCOUNTER — Ambulatory Visit (INDEPENDENT_AMBULATORY_CARE_PROVIDER_SITE_OTHER): Payer: BC Managed Care – PPO | Admitting: Internal Medicine

## 2014-07-21 VITALS — BP 112/60 | HR 73 | Temp 97.8°F | Ht 64.5 in | Wt 182.0 lb

## 2014-07-21 VITALS — BP 112/60 | HR 73 | Wt 182.0 lb

## 2014-07-21 DIAGNOSIS — G894 Chronic pain syndrome: Secondary | ICD-10-CM

## 2014-07-21 DIAGNOSIS — C50919 Malignant neoplasm of unspecified site of unspecified female breast: Secondary | ICD-10-CM

## 2014-07-21 DIAGNOSIS — R300 Dysuria: Secondary | ICD-10-CM | POA: Insufficient documentation

## 2014-07-21 DIAGNOSIS — E1165 Type 2 diabetes mellitus with hyperglycemia: Secondary | ICD-10-CM

## 2014-07-21 DIAGNOSIS — F4323 Adjustment disorder with mixed anxiety and depressed mood: Secondary | ICD-10-CM

## 2014-07-21 DIAGNOSIS — IMO0002 Reserved for concepts with insufficient information to code with codable children: Secondary | ICD-10-CM

## 2014-07-21 DIAGNOSIS — R0600 Dyspnea, unspecified: Secondary | ICD-10-CM

## 2014-07-21 LAB — POCT URINALYSIS DIPSTICK
Bilirubin, UA: NEGATIVE
Glucose, UA: >=1000
Ketones, UA: NEGATIVE
Nitrite, UA: NEGATIVE
Protein, UA: NEGATIVE
Spec Grav, UA: <=1.005
Urobilinogen, UA: 0.2
pH, UA: 5.5

## 2014-07-21 MED ORDER — LIRAGLUTIDE 18 MG/3ML ~~LOC~~ SOPN: PEN_INJECTOR | SUBCUTANEOUS | Status: DC

## 2014-07-21 MED ORDER — INSULIN PEN NEEDLE 32G X 4 MM MISC: Status: DC

## 2014-07-21 MED ORDER — FLUCONAZOLE 150 MG PO TABS: 150.0000 mg | ORAL_TABLET | ORAL | Status: DC

## 2014-07-21 NOTE — Progress Notes (Signed)
Pre visit review using our clinic review tool, if applicable. No additional management support is needed unless otherwise documented below in the visit note. 

## 2014-07-21 NOTE — Assessment & Plan Note (Signed)
Started on Tamoxifen by oncology. Has not yet picked up Rx. Encouraged her to do so.

## 2014-07-21 NOTE — Assessment & Plan Note (Signed)
CXR and CT chest showed scarring in the right lung. ECHO was normal. Will set up pulmonary evaluation for PFTs.

## 2014-07-21 NOTE — Progress Notes (Signed)
Reason for visit-  Kristina Mccoy is a 58 y.o.-year-old female, here for follow up  of Type 2 diabetes, uncontrolled, without complications. Last seen 5 weeks ago.    HPI- Diagnosed in 2000. Recalls being initially on lifestyle modifications.   Prior treatment hx-   - Metformin- didn't tolerate due to GI symptoms - Januvia was taken off due to "scare of side effects".  - well controlled till 2014, till she was dxed with breast cancer. Then during chemotherapy was on levemir and novolog around December 2014.  - Didn't tolerate levemir, and stopped August 2015- due to feeling bloated with medication and skin itching.  - ? Rash/Hives with prior Novolog use.  -Glyburide switched to Glipizide at visit in 05/2014 - Invokana started 05/2014 - has had 2 yeast infections since then   Pt is currently on a regimen of: - Glipizide 5mg  twice daily -Invokana 100 mg daily ( stopped this week due to 2nd yeast infection)  Last hemoglobin A1c was: Lab Results  Component Value Date   HGBA1C 10.0* 05/04/2014   HGBA1C 9.4* 01/24/2014   HGBA1C 8.7* 10/04/2013    Pt checks her sugars 2 times a day . Nature conservation officer. By recall  they are improving:  PREMEAL Breakfast Lunch Dinner Bedtime Overall  Glucose range: 150s   >200   Mean/median:        Hypoglycemia/Hyperglycemia-  No recent lows.  she has hypoglycemia awareness at 23.    Dietary habits- eats three times daily. Tries to limit carbs, sweetened beverages, sodas, desserts. Not eating sweets and junk foods. Usual foods include Oatmeal, salad, [place] on wheat bread or a sandwich, chicken, veggies. Back to measuring foods. Hasn't done too well as overall hasn't been feeling good.  Exercise- walks for exercise mainly. Had started more intense exercise, but had to stop it recently due to joint pain. Also, SOB limits her exercise ability.  Weight - stable  Wt Readings from Last 3 Encounters:  07/21/14 182 lb (82.555 kg)  07/21/14 182 lb  (82.555 kg)  07/07/14 185 lb 8 oz (84.142 kg)    Diabetes Complications- No known complications Nephropathy-- No  CKD, last BUN/creatinine- Lab Results  Component Value Date   BUN 13 05/27/2014   CREATININE 0.8 05/27/2014    Lab Results  Component Value Date   GFR 76.55 05/27/2014   Lab Results  Component Value Date   MICRALBCREAT 1.4 05/04/2014    Retinopathy- No, Last DEE- is overdue for this year. Going to make appt for follow up.  Neuropathy-  Reports numbness and tingling in her toes and hands. No known neuropathy.  Associated history - No history of CAD or prior stroke. Has PVD. No hypothyroidism. her last TSH was  Lab Results  Component Value Date   TSH 0.63 05/04/2014     Blood Pressure- Patient's blood pressure is well controlled today.  Hyperlipidemia- her last set of lipids were- Lab Results  Component Value Date   CHOL 210* 05/04/2014   HDL 44.90 05/04/2014   LDLCALC 115* 01/24/2014   LDLDIRECT 155.3 05/04/2014   TRIG 239.0* 05/04/2014   CHOLHDL 5 05/04/2014   I have reviewed the patient's past medical history,  medications and allergies.   Current Outpatient Prescriptions on File Prior to Visit  Medication Sig Dispense Refill  . ALPRAZolam (XANAX) 0.5 MG tablet Take 1 tablet (0.5 mg total) by mouth 4 (four) times daily as needed for anxiety.  120 tablet  1  . cetirizine (ZYRTEC)  10 MG tablet Take 10 mg by mouth 2 (two) times daily.      . clobetasol ointment (TEMOVATE) 9.93 % Apply 1 application topically 2 (two) times daily.      . Emollient (CERAVE) CREA Apply topically.      [place] Kitchen glipiZIDE (GLUCOTROL) 5 MG tablet Take 1 tablet (5 mg total) by mouth 2 (two) times daily before a meal.  60 tablet  3  . glucose blood (FREESTYLE LITE) test strip Use as instructed  100 each  5  . hydrOXYzine (ATARAX/VISTARIL) 25 MG tablet Take 25 mg by mouth daily.      . hyoscyamine (LEVSIN, ANASPAZ) 0.125 MG tablet Take 1 tablet (0.125 mg total) by mouth every 4 (four) hours as needed.  30  tablet  0  . Lancets (FREESTYLE) lancets Use as instructed  100 each  5  . LORAZEPAM PO Take by mouth. Take 2nd for nausea      . oxyCODONE (OXY IR/ROXICODONE) 5 MG immediate release tablet Take 1 tablet (5 mg total) by mouth every 6 (six) hours as needed for severe pain.  120 tablet  0  . PARoxetine (PAXIL) 10 MG tablet Take 1 tablet (10 mg total) by mouth daily.  30 tablet  0  . prochlorperazine (COMPAZINE) 10 MG tablet Take 10 mg by mouth every 6 (six) hours as needed.      . ranitidine (ZANTAC) 300 MG tablet Take 1 tablet (300 mg total) by mouth daily.  90 tablet  3  . tamoxifen (NOLVADEX) 20 MG tablet Take 20 mg by mouth daily.      . fluconazole (DIFLUCAN) 150 MG tablet Take 1 tablet (150 mg total) by mouth once a week. Take one tablet weekly for two weeks.  3 tablet  0   No current facility-administered medications on file prior to visit.   Allergies  Allergen Reactions  . Gabapentin Itching and Swelling  . Pregabalin Swelling    Swelling in arms and legs    Review of Systems- [ x ]  Complains of    [  ]  denies [  ] Recent weight change  [  ]  vision difficulty [  ] chest pain [ x ] shortness of breath, unchanged [x  ] nausea/ no vomiting [  ] diarrhea [  ] constipation [  ] abdominal pain [  ]  tingling/numbness in extremities [  ]  concern with feet ( wounds/sores)   PE: BP 112/60  Pulse 73  Wt 182 lb (82.555 kg)  SpO2 98% Wt Readings from Last 3 Encounters:  07/21/14 182 lb (82.555 kg)  07/21/14 182 lb (82.555 kg)  07/07/14 185 lb 8 oz (84.142 kg)   Exam: deferred  ASSESSMENT: 1. Type 2 diabetes, uncontrolled  Problem List Items Addressed This Visit     Other   Diabetes type 2, uncontrolled - Primary     Recent A1c not controlled. Recent reported sugars are closer to goal. Reminded her to follow her diet and exercise closer.   Discussed that with 2 recent episodes of yeast infections, best to avoid Invokana for now.  Alternatives would be to try GLP-1 agent  versus basal insulin.  Discussed pros and cons.  She has elected to try Victoza at this time. Start at 0.6 mg [place] daily and increase as tolerated to 1.2 mg [place] daily. Report back if do not tolerate. Stop Invokana and continue current Glipizide.  Check sugars 2-3 times daily.   RTC 1 month.  Will remind her at that time, to make opthalm appt.     Relevant Medications      Liraglutide 18 MG/3ML SOPN      Insulin Pen Needle (BD PEN NEEDLE NANO U/F) 32G X 4 MM MISC      - Return to clinic in 1 mo with sugar log/meter.  Bynum Bellows [facility] 07/21/2014 4:38 PM

## 2014-07-21 NOTE — Assessment & Plan Note (Signed)
Symptoms continued to be poorly controlled. Awaiting rheumatology evaluation. Will continue Oxycodone prn. Encouraged her to start Paxil.

## 2014-07-21 NOTE — Assessment & Plan Note (Signed)
Encouraged her to start Paxil as discussed at previous visit.

## 2014-07-21 NOTE — Patient Instructions (Signed)
  Stop Invokana.   Start Victoza at 0.6 mg [place] daily and in 2 weeks increase it to 1. 2 mg [place] daily.  Report Back if donot tolerate due to nausea, vomiting abdominal pain.   Continue current Glipizide.   Please come back for a follow-up appointment in 1 month.

## 2014-07-21 NOTE — Assessment & Plan Note (Addendum)
Recent A1c not controlled. Recent reported sugars are closer to goal. Reminded her to follow her diet and exercise closer.   Discussed that with 2 recent episodes of yeast infections, best to avoid Invokana for now.  Alternatives would be to try GLP-1 agent versus basal insulin.  Discussed pros and cons.  She has elected to try Victoza at this time. Start at 0.6 mg [place] daily and increase as tolerated to 1.2 mg [place] daily. Report back if do not tolerate. Stop Invokana and continue current Glipizide.  Check sugars 2-3 times daily.   RTC 1 month. Will remind her at that time, to make opthalm appt.

## 2014-07-21 NOTE — Assessment & Plan Note (Signed)
Recent dysuria associated with vaginal yeast infection. Will start Fluconazole. Urinalysis today showed trace blood. Will send for culture. She will call with any worsening symptoms.

## 2014-07-21 NOTE — Progress Notes (Signed)
Subjective:    Patient ID: Kristina Mccoy, female    DOB: 1955-10-27, 58 y.o.   MRN: [id number]  HPI 58YO female presents for follow up.  At recent visit, was having issues with diffuse joint pain. Also having shortness of breath with exertion. ECHO was normal. CXR showed questionable area right lung, CT chest suggested scar tissue.  Started on Paroxetine to help with symptoms of anxiety.  Has another vaginal yeast infection. Suspects related to using Invokana. Took Fluconazole on Monday. This past weekend, also noted some black specs in her urine. Some burning with urination. No fever, however does occasionally have chills. Occasional right flank pain.  Continues to feel short of breath. Diffuse pain is unchanged.  Has not yet started Paxil. Concerned to start anything new with recent urticaria.  Oncologist has prescribed Tamoxifen, but she has not started this yet.  Review of Systems  Constitutional: Positive for fatigue. Negative for fever, chills, appetite change and unexpected weight change.  Eyes: Negative for visual disturbance.  Respiratory: Negative for shortness of breath.   Cardiovascular: Negative for chest pain and leg swelling.  Gastrointestinal: Negative for nausea, vomiting, abdominal pain, diarrhea and constipation.  Endocrine: Negative for polyuria.  Genitourinary: Positive for dysuria. Negative for urgency, frequency, hematuria and genital sores.  Musculoskeletal: Positive for arthralgias and myalgias.  Skin: Negative for color change and rash.  Hematological: Negative for adenopathy. Does not bruise/bleed easily.  Psychiatric/Behavioral: Positive for sleep disturbance and dysphoric mood. Negative for suicidal ideas. The patient is nervous/anxious.        Objective:    BP 112/60  Pulse 73  Temp(Src) 97.8 F (36.6 C) (Oral)  Ht 5' 4.5" (1.638 m)  Wt 182 lb (82.555 kg)  BMI 30.77 kg/m2  SpO2 98% Physical Exam  Constitutional: She is oriented to person,  place, and time. She appears well-developed and well-nourished. No distress.  HENT:  Head: Normocephalic and atraumatic.  Right Ear: External ear normal.  Left Ear: External ear normal.  Nose: Nose normal.  Mouth/Throat: Oropharynx is clear and moist. No oropharyngeal exudate.  Eyes: Conjunctivae are normal. Pupils are equal, round, and reactive to light. Right eye exhibits no discharge. Left eye exhibits no discharge. No scleral icterus.  Neck: Normal range of motion. Neck supple. No tracheal deviation present. No thyromegaly present.  Cardiovascular: Normal rate, regular rhythm, normal heart sounds and intact distal pulses.  Exam reveals no gallop and no friction rub.   No murmur heard. Pulmonary/Chest: Effort normal and breath sounds normal. No accessory muscle usage. Not tachypneic. No respiratory distress. She has no decreased breath sounds. She has no wheezes. She has no rhonchi. She has no rales. She exhibits no tenderness.  Abdominal: There is no tenderness (no CVA tenderness).  Musculoskeletal: Normal range of motion. She exhibits no edema and no tenderness.  Lymphadenopathy:    She has no cervical adenopathy.  Neurological: She is alert and oriented to person, place, and time. No cranial nerve deficit. She exhibits normal muscle tone. Coordination normal.  Skin: Skin is warm and dry. No rash noted. She is not diaphoretic. No erythema. No pallor.  Psychiatric: She has a normal mood and affect. Her behavior is normal. Judgment and thought content normal.          Assessment & Plan:   Problem List Items Addressed This Visit     Unprioritized   Adjustment disorder with mixed anxiety and depressed mood - Primary     Encouraged her to start Paxil  as discussed at previous visit.    Relevant Orders      CULTURE, URINE COMPREHENSIVE   Chronic pain disorder     Symptoms continued to be poorly controlled. Awaiting rheumatology evaluation. Will continue Oxycodone prn. Encouraged her  to start Paxil.    Relevant Orders      CULTURE, URINE COMPREHENSIVE   Dyspnea     CXR and CT chest showed scarring in the right lung. ECHO was normal. Will set up pulmonary evaluation for PFTs.    Relevant Orders      Ambulatory referral to Pulmonology      CULTURE, URINE COMPREHENSIVE   Dysuria     Recent dysuria associated with vaginal yeast infection. Will start Fluconazole. Urinalysis today showed trace blood. Will send for culture. She will call with any worsening symptoms.    Relevant Orders      POCT Urinalysis Dipstick (Completed)      CULTURE, URINE COMPREHENSIVE   Malignant neoplasm of breast (female)     Started on Tamoxifen by oncology. Has not yet picked up Rx. Encouraged her to do so.    Relevant Medications      tamoxifen (NOLVADEX) 20 MG tablet   Other Relevant Orders      CULTURE, URINE COMPREHENSIVE       Return in about 4 weeks (around 08/18/2014).

## 2014-07-21 NOTE — Patient Instructions (Signed)
We will set up referral to pulmonary medicine.  Follow up in 4 weeks.

## 2014-07-23 LAB — CULTURE, URINE COMPREHENSIVE
Colony Count: NO GROWTH
Organism ID, Bacteria: NO GROWTH

## 2014-07-25 ENCOUNTER — Encounter: Payer: Self-pay | Admitting: Endocrinology

## 2014-07-26 ENCOUNTER — Encounter: Payer: Self-pay | Admitting: Internal Medicine

## 2014-07-26 ENCOUNTER — Other Ambulatory Visit: Payer: Self-pay | Admitting: Endocrinology

## 2014-07-26 DIAGNOSIS — E1165 Type 2 diabetes mellitus with hyperglycemia: Secondary | ICD-10-CM

## 2014-07-26 DIAGNOSIS — IMO0002 Reserved for concepts with insufficient information to code with codable children: Secondary | ICD-10-CM

## 2014-07-26 MED ORDER — GLIPIZIDE 5 MG PO TABS: 10.0000 mg | ORAL_TABLET | Freq: Two times a day (BID) | ORAL | Status: DC

## 2014-08-04 ENCOUNTER — Institutional Professional Consult (permissible substitution): Payer: BC Managed Care – PPO | Admitting: Internal Medicine

## 2014-08-09 ENCOUNTER — Encounter: Payer: Self-pay | Admitting: Endocrinology

## 2014-08-11 ENCOUNTER — Encounter: Payer: Self-pay | Admitting: *Deleted

## 2014-08-11 LAB — HM DIABETES EYE EXAM

## 2014-08-16 ENCOUNTER — Ambulatory Visit (INDEPENDENT_AMBULATORY_CARE_PROVIDER_SITE_OTHER): Payer: BC Managed Care – PPO | Admitting: Internal Medicine

## 2014-08-16 ENCOUNTER — Ambulatory Visit: Payer: BC Managed Care – PPO | Admitting: Endocrinology

## 2014-08-16 ENCOUNTER — Encounter: Payer: Self-pay | Admitting: Internal Medicine

## 2014-08-16 VITALS — BP 118/81 | HR 76 | Ht 64.5 in | Wt 188.0 lb

## 2014-08-16 DIAGNOSIS — F32A Depression, unspecified: Secondary | ICD-10-CM | POA: Insufficient documentation

## 2014-08-16 DIAGNOSIS — F329 Major depressive disorder, single episode, unspecified: Secondary | ICD-10-CM | POA: Insufficient documentation

## 2014-08-16 DIAGNOSIS — F419 Anxiety disorder, unspecified: Secondary | ICD-10-CM

## 2014-08-16 DIAGNOSIS — R1013 Epigastric pain: Secondary | ICD-10-CM

## 2014-08-16 DIAGNOSIS — G894 Chronic pain syndrome: Secondary | ICD-10-CM

## 2014-08-16 LAB — CBC WITH DIFFERENTIAL/PLATELET
Basophils Absolute: 0 10*3/uL (ref 0.0–0.1)
Basophils Relative: 0.3 % (ref 0.0–3.0)
Eosinophils Absolute: 0.3 10*3/uL (ref 0.0–0.7)
Eosinophils Relative: 3.2 % (ref 0.0–5.0)
HCT: 42.3 % (ref 36.0–46.0)
Hemoglobin: 14.3 g/dL (ref 12.0–15.0)
Lymphocytes Relative: 15.8 % (ref 12.0–46.0)
Lymphs Abs: 1.2 10*3/uL (ref 0.7–4.0)
MCHC: 33.7 g/dL (ref 30.0–36.0)
MCV: 89.2 fl (ref 78.0–100.0)
Monocytes Absolute: 0.8 10*3/uL (ref 0.1–1.0)
Monocytes Relative: 9.8 % (ref 3.0–12.0)
Neutro Abs: 5.5 10*3/uL (ref 1.4–7.7)
Neutrophils Relative %: 70.9 % (ref 43.0–77.0)
Platelets: 204 10*3/uL (ref 150.0–400.0)
RBC: 4.74 Mil/uL (ref 3.87–5.11)
RDW: 14.7 % (ref 11.5–15.5)
WBC: 7.7 10*3/uL (ref 4.0–10.5)

## 2014-08-16 MED ORDER — PANTOPRAZOLE SODIUM 40 MG PO TBEC: 40.0000 mg | DELAYED_RELEASE_TABLET | Freq: Two times a day (BID) | ORAL | Status: DC

## 2014-08-16 MED ORDER — OXYCODONE HCL 5 MG PO TABS: 5.0000 mg | ORAL_TABLET | Freq: Four times a day (QID) | ORAL | Status: DC | PRN

## 2014-08-16 MED ORDER — ALPRAZOLAM 0.5 MG PO TABS: 0.5000 mg | ORAL_TABLET | Freq: Four times a day (QID) | ORAL | Status: DC | PRN

## 2014-08-16 NOTE — Progress Notes (Signed)
Pre visit review using our clinic review tool, if applicable. No additional management support is needed unless otherwise documented below in the visit note. 

## 2014-08-16 NOTE — Assessment & Plan Note (Signed)
Epigastric and RUQ pain. Suspect gastric ulcer. Will get CT abdomen for further evaluation of liver, gallbladder and pancreas. Labs today with CMP, CBC, lipase. Start Pantoprazole 40mg  po bid. STOP Ibuprofen. GI evaluation for possible endoscopy.

## 2014-08-16 NOTE — Assessment & Plan Note (Signed)
Encouraged her to stop Ibuprofen. Continue Oxycodone prn pain.

## 2014-08-16 NOTE — Assessment & Plan Note (Signed)
Symptoms well controlled with Alprazolam. Will continue. 

## 2014-08-16 NOTE — Addendum Note (Signed)
Addended by: Karlene Einstein D on: 08/16/2014 08:33 AM   Modules accepted: Orders

## 2014-08-16 NOTE — Patient Instructions (Signed)
Stop Ibuprofen.  Start Pantoprazole 40mg  twice daily.  We will set up an evaluation with GI.

## 2014-08-16 NOTE — Progress Notes (Signed)
Subjective:    Patient ID: Kristina Mccoy, female    DOB: Apr 16, 1956, 58 y.o.   MRN: [id number]  HPI 58YO female presents for follow up.  Woke up today with sore throat on left side.  Over the last month, feeling very nauseous. Has pain in epigastric area after eating. Felt more distended in upper abdomen recent. Taking Compazine for nausea with some improvement. No vomiting. Held Victoza. Glipizide was increased. Trying to cut back on use of Ibuprofen. Taking about 1200mg  daily of Ibuprofen daily. Taking Zantac 300mg  daily. Had peptic ulcer in the past.   Review of Systems  Constitutional: Negative for fever, chills, appetite change, fatigue and unexpected weight change.  Eyes: Negative for visual disturbance.  Respiratory: Negative for shortness of breath.   Cardiovascular: Negative for chest pain and leg swelling.  Gastrointestinal: Positive for nausea, abdominal pain and abdominal distention. Negative for vomiting, diarrhea and constipation.  Musculoskeletal: Positive for myalgias and arthralgias.  Skin: Negative for color change and rash.  Hematological: Negative for adenopathy. Does not bruise/bleed easily.  Psychiatric/Behavioral: Positive for sleep disturbance. Negative for dysphoric mood. The patient is nervous/anxious.        Objective:    BP 118/81 mmHg  Pulse 76  Ht 5' 4.5" (1.638 m)  Wt 188 lb (85.276 kg)  BMI 31.78 kg/m2  SpO2 100% Physical Exam  Constitutional: She is oriented to person, place, and time. She appears well-developed and well-nourished. No distress.  HENT:  Head: Normocephalic and atraumatic.  Right Ear: External ear normal.  Left Ear: External ear normal.  Nose: Nose normal.  Mouth/Throat: Oropharynx is clear and moist. No oropharyngeal exudate.  Eyes: Conjunctivae are normal. Pupils are equal, round, and reactive to light. Right eye exhibits no discharge. Left eye exhibits no discharge. No scleral icterus.  Neck: Normal range of motion.  Neck supple. No tracheal deviation present. No thyromegaly present.  Cardiovascular: Normal rate, regular rhythm, normal heart sounds and intact distal pulses.  Exam reveals no gallop and no friction rub.   No murmur heard. Pulmonary/Chest: Effort normal and breath sounds normal. No accessory muscle usage. No tachypnea. No respiratory distress. She has no decreased breath sounds. She has no wheezes. She has no rhonchi. She has no rales. She exhibits no tenderness.  Abdominal: Soft. Bowel sounds are normal. She exhibits distension. She exhibits no mass. There is tenderness (epigastric and right upper abdomen). There is no guarding.  Musculoskeletal: Normal range of motion. She exhibits no edema or tenderness.  Lymphadenopathy:    She has no cervical adenopathy.  Neurological: She is alert and oriented to person, place, and time. No cranial nerve deficit. She exhibits normal muscle tone. Coordination normal.  Skin: Skin is warm and dry. No rash noted. She is not diaphoretic. No erythema. No pallor.  Psychiatric: She has a normal mood and affect. Her behavior is normal. Judgment and thought content normal.          Assessment & Plan:   Problem List Items Addressed This Visit      Unprioritized   Abdominal pain, epigastric - Primary    Epigastric and RUQ pain. Suspect gastric ulcer. Will get CT abdomen for further evaluation of liver, gallbladder and pancreas. Labs today with CMP, CBC, lipase. Start Pantoprazole 40mg  po bid. STOP Ibuprofen. GI evaluation for possible endoscopy.    Relevant Medications      pantoprazole (PROTONIX) EC tablet   Other Relevant Orders      Ambulatory referral to Gastroenterology  CT Abdomen Pelvis W Contrast      Comprehensive metabolic panel      CBC with Differential      H. pylori breath test   Anxiety    Symptoms well controlled with Alprazolam. Will continue.    Relevant Medications      ALPRAZolam (XANAX) tablet   Chronic pain disorder     Encouraged her to stop Ibuprofen. Continue Oxycodone prn pain.        Return in about 2 weeks (around 08/30/2014).

## 2014-08-17 LAB — COMPREHENSIVE METABOLIC PANEL
ALT: 34 U/L (ref 0–35)
AST: 31 U/L (ref 0–37)
Albumin: 3.8 g/dL (ref 3.5–5.2)
Alkaline Phosphatase: 91 U/L (ref 39–117)
BUN: 13 mg/dL (ref 6–23)
CO2: 28 mEq/L (ref 19–32)
Calcium: 8.8 mg/dL (ref 8.4–10.5)
Chloride: 99 mEq/L (ref 96–112)
Creatinine, Ser: 0.8 mg/dL (ref 0.4–1.2)
GFR: 74.39 mL/min (ref >60.00)
Glucose, Bld: 288 mg/dL — ABNORMAL HIGH (ref 70–99)
Potassium: 4.2 mEq/L (ref 3.5–5.1)
Sodium: 133 mEq/L — ABNORMAL LOW (ref 135–145)
Total Bilirubin: 0.2 mg/dL (ref 0.2–1.2)
Total Protein: 7.2 g/dL (ref 6.0–8.3)

## 2014-08-17 LAB — H. PYLORI BREATH TEST: H. pylori Breath Test: NOT DETECTED

## 2014-08-17 LAB — LIPASE: Lipase: 34 U/L (ref 11.0–59.0)

## 2014-08-18 ENCOUNTER — Encounter: Payer: Self-pay | Admitting: Internal Medicine

## 2014-08-23 ENCOUNTER — Ambulatory Visit: Payer: BC Managed Care – PPO | Admitting: Endocrinology

## 2014-08-30 ENCOUNTER — Encounter: Payer: Self-pay | Admitting: Internal Medicine

## 2014-08-31 ENCOUNTER — Inpatient Hospital Stay: Payer: Self-pay | Admitting: Specialist

## 2014-08-31 LAB — URINALYSIS, COMPLETE
Bacteria: NONE SEEN
Bilirubin,UR: NEGATIVE
Glucose,UR: >=500 mg/dL (ref 0–75)
Hyaline Cast: 15
Leukocyte Esterase: NEGATIVE
Nitrite: NEGATIVE
Ph: 5 (ref 4.5–8.0)
Protein: 30
RBC,UR: 1 /HPF (ref 0–5)
Specific Gravity: 1.017 (ref 1.003–1.030)
Squamous Epithelial: 2
WBC UR: 3 /HPF (ref 0–5)

## 2014-08-31 LAB — TROPONIN I
Troponin-I: 0.23 ng/mL — ABNORMAL HIGH
Troponin-I: 0.16 ng/mL — ABNORMAL HIGH

## 2014-08-31 LAB — D-DIMER(ARMC): D-Dimer: 2829 ng/ml

## 2014-09-01 DIAGNOSIS — R0600 Dyspnea, unspecified: Secondary | ICD-10-CM

## 2014-09-01 DIAGNOSIS — R7989 Other specified abnormal findings of blood chemistry: Secondary | ICD-10-CM

## 2014-09-01 LAB — CBC WITH DIFFERENTIAL/PLATELET
Basophil #: 0 10*3/uL (ref 0.0–0.1)
Basophil %: 0.1 %
Eosinophil #: 0.1 10*3/uL (ref 0.0–0.7)
Eosinophil %: 0.4 %
HCT: 35.3 % (ref 35.0–47.0)
HGB: 11.6 g/dL — ABNORMAL LOW (ref 12.0–16.0)
Lymphocyte #: 1.3 10*3/uL (ref 1.0–3.6)
Lymphocyte %: 6 %
MCH: 29.9 pg (ref 26.0–34.0)
MCHC: 32.9 g/dL (ref 32.0–36.0)
MCV: 91 fL (ref 80–100)
Monocyte #: 1.7 x10 3/mm — ABNORMAL HIGH (ref 0.2–0.9)
Monocyte %: 7.8 %
Neutrophil #: 18.1 10*3/uL — ABNORMAL HIGH (ref 1.4–6.5)
Neutrophil %: 85.7 %
Platelet: 256 10*3/uL (ref 150–440)
RBC: 3.89 10*6/uL (ref 3.80–5.20)
RDW: 14.8 % — ABNORMAL HIGH (ref 11.5–14.5)
WBC: 21.1 10*3/uL — ABNORMAL HIGH (ref 3.6–11.0)

## 2014-09-01 LAB — BASIC METABOLIC PANEL
Anion Gap: 7 (ref 7–16)
BUN: 20 mg/dL — ABNORMAL HIGH (ref 7–18)
Calcium, Total: 8.5 mg/dL (ref 8.5–10.1)
Chloride: 97 mmol/L — ABNORMAL LOW (ref 98–107)
Co2: 25 mmol/L (ref 21–32)
Creatinine: 0.96 mg/dL (ref 0.60–1.30)
EGFR (African American): >60
EGFR (Non-African Amer.): >60
Glucose: 125 mg/dL — ABNORMAL HIGH (ref 65–99)
Osmolality: 263 (ref 275–301)
Potassium: 4.1 mmol/L (ref 3.5–5.1)
Sodium: 129 mmol/L — ABNORMAL LOW (ref 136–145)

## 2014-09-01 LAB — HEMOGLOBIN A1C: Hemoglobin A1C: 9.5 % — ABNORMAL HIGH (ref 4.2–6.3)

## 2014-09-02 ENCOUNTER — Ambulatory Visit: Payer: BC Managed Care – PPO | Admitting: Internal Medicine

## 2014-09-02 ENCOUNTER — Telehealth: Payer: Self-pay | Admitting: Internal Medicine

## 2014-09-02 NOTE — Telephone Encounter (Signed)
Pt is hospitalized unable to make appt today/msn

## 2014-09-03 LAB — LIPID PANEL
Cholesterol: 113 mg/dL (ref 0–200)
HDL Cholesterol: 16 mg/dL — ABNORMAL LOW (ref 40–60)
Ldl Cholesterol, Calc: 76 mg/dL (ref 0–100)
Triglycerides: 107 mg/dL (ref 0–200)
VLDL Cholesterol, Calc: 21 mg/dL (ref 5–40)

## 2014-09-03 LAB — BASIC METABOLIC PANEL
Anion Gap: 5 — ABNORMAL LOW (ref 7–16)
BUN: 10 mg/dL (ref 7–18)
Calcium, Total: 8 mg/dL — ABNORMAL LOW (ref 8.5–10.1)
Chloride: 108 mmol/L — ABNORMAL HIGH (ref 98–107)
Co2: 24 mmol/L (ref 21–32)
Creatinine: 0.84 mg/dL (ref 0.60–1.30)
EGFR (African American): >60
EGFR (Non-African Amer.): >60
Glucose: 271 mg/dL — ABNORMAL HIGH (ref 65–99)
Osmolality: 282 (ref 275–301)
Potassium: 3.9 mmol/L (ref 3.5–5.1)
Sodium: 137 mmol/L (ref 136–145)

## 2014-09-03 LAB — PRO B NATRIURETIC PEPTIDE: B-Type Natriuretic Peptide: 3333 pg/mL — ABNORMAL HIGH (ref 0–125)

## 2014-09-03 LAB — WBC: WBC: 11.4 10*3/uL — ABNORMAL HIGH (ref 3.6–11.0)

## 2014-09-05 ENCOUNTER — Telehealth: Payer: Self-pay | Admitting: Internal Medicine

## 2014-09-05 NOTE — Telephone Encounter (Signed)
Appointment was made on 09/08/14 at 4:00pm.

## 2014-09-05 NOTE — Telephone Encounter (Signed)
Tonya w/[facility] called for a one to two week hospital follow-up for the patient.  She was admitted with Pneumonia.  Please advise as to where you would like the patient to be put on the schedule.  Patient needs to be notified of the appt.

## 2014-09-05 NOTE — Telephone Encounter (Signed)
4pm Thursday

## 2014-09-07 ENCOUNTER — Telehealth: Payer: Self-pay | Admitting: *Deleted

## 2014-09-07 NOTE — Telephone Encounter (Signed)
Discharge date: 09/05/14  Transition Care Management Follow-up Telephone Call  How have you been since you were released from the hospital? Pt states "doing better but hurts to take deep breath, still wheezing and having pain in back"    Do you understand why you were in the hospital? YES   Do you understand the discharge instrcutions? YES  Items Reviewed:  Medications reviewed: YES  Allergies reviewed: NO  Dietary changes reviewed: NO  Referrals reviewed: N/A   Functional Questionnaire:   Activities of Daily Living (ADLs):   She states they are independent in the following:  States they require assistance with the following:    Any transportation issues/concerns?: NO, pt has friend bringing her    Any patient concerns? NO   Confirmed importance and date/time of follow-up visits scheduled: YES, Dec 10 @ 4:00   Confirmed with patient if condition begins to worsen call PCP or go to the ER.  Patient was given the Call-a-Nurse line [phone]: YES

## 2014-09-08 ENCOUNTER — Encounter: Payer: Self-pay | Admitting: Internal Medicine

## 2014-09-08 ENCOUNTER — Telehealth: Payer: Self-pay | Admitting: Internal Medicine

## 2014-09-08 ENCOUNTER — Ambulatory Visit: Payer: BC Managed Care – PPO | Admitting: Internal Medicine

## 2014-09-08 ENCOUNTER — Ambulatory Visit (INDEPENDENT_AMBULATORY_CARE_PROVIDER_SITE_OTHER): Payer: BC Managed Care – PPO | Admitting: Internal Medicine

## 2014-09-08 VITALS — BP 154/89 | HR 80 | Temp 97.5°F | Ht 64.5 in | Wt 184.5 lb

## 2014-09-08 DIAGNOSIS — J189 Pneumonia, unspecified organism: Secondary | ICD-10-CM | POA: Insufficient documentation

## 2014-09-08 DIAGNOSIS — R938 Abnormal findings on diagnostic imaging of other specified body structures: Secondary | ICD-10-CM

## 2014-09-08 DIAGNOSIS — R9389 Abnormal findings on diagnostic imaging of other specified body structures: Secondary | ICD-10-CM

## 2014-09-08 HISTORY — DX: Abnormal findings on diagnostic imaging of other specified body structures: R93.89

## 2014-09-08 NOTE — Progress Notes (Signed)
Pre visit review using our clinic review tool, if applicable. No additional management support is needed unless otherwise documented below in the visit note. 

## 2014-09-08 NOTE — Assessment & Plan Note (Signed)
Symptoms improving. Will recheck CXR tomorrow, and CBC and CMP today. Continue Levaquin to complete course. Follow up in 1-2 weeks.

## 2014-09-08 NOTE — Patient Instructions (Signed)
Labs today.  Follow up in 4 weeks.  CT chest in 11/2014.

## 2014-09-08 NOTE — Assessment & Plan Note (Signed)
Enlarged right paratracheal lymph node seen on CT chest 12/2. Will plan repeat chest CT in 11/2014.

## 2014-09-08 NOTE — Telephone Encounter (Signed)
Pt needs 2wk follow up. Advise where to add to schedule.msn

## 2014-09-08 NOTE — Progress Notes (Signed)
Subjective:    Patient ID: Kristina Mccoy, female    DOB: 10-21-1955, 58 y.o.   MRN: [id number]  HPI 58YO female presents for hospital follow up.  ADMITTED: 08/31/2014 DISCHARGED: 09/05/2014  DIAGNOSIS: Pneumonia  Woke on Nov 18th with severe pain in right arm. This passed over several days, then she developed pain in back on the right side. No cough, fever. Brother called EMS from [place] on 12/2. Had stopped urinating for 1.5 days. Had shaking chills. When EMS arrived, sats in 50-60% on RA and SBP 50s. On admission, WBC 29K CXR showed possible LLL pneumonia. CT chest showed enlarged right paratracheal lymph node, recommended follow up CT in 3 months. CT neg PE. Troponin elevated during admission. Thought secondary to strain. Discharged home on Levaquin 10 day course.  Since discharge, has had non-productive cough. Feels short of breath at times. Continues to have bilateral chest pain with deep inspiration. No fever or chills at home.   Past medical, surgical, family and social history per today's encounter.  Review of Systems  Constitutional: Positive for fatigue. Negative for fever, chills, appetite change and unexpected weight change.  Eyes: Negative for visual disturbance.  Respiratory: Positive for cough (occasional dry) and shortness of breath.   Cardiovascular: Negative for chest pain and leg swelling.  Gastrointestinal: Negative for abdominal pain.  Musculoskeletal: Positive for myalgias, back pain and arthralgias.  Skin: Negative for color change and rash.  Hematological: Negative for adenopathy. Does not bruise/bleed easily.  Psychiatric/Behavioral: Negative for sleep disturbance and dysphoric mood. The patient is not nervous/anxious.        Objective:    BP 154/89 mmHg  Pulse 80  Temp(Src) 97.5 F (36.4 C) (Oral)  Ht 5' 4.5" (1.638 m)  Wt 184 lb 8 oz (83.689 kg)  BMI 31.19 kg/m2  SpO2 98% Physical Exam  Constitutional: She is oriented to person, place, and  time. She appears well-developed and well-nourished. No distress.  HENT:  Head: Normocephalic and atraumatic.  Right Ear: External ear normal.  Left Ear: External ear normal.  Nose: Nose normal.  Mouth/Throat: Oropharynx is clear and moist. No oropharyngeal exudate.  Eyes: Conjunctivae are normal. Pupils are equal, round, and reactive to light. Right eye exhibits no discharge. Left eye exhibits no discharge. No scleral icterus.  Neck: Normal range of motion. Neck supple. No tracheal deviation present. No thyromegaly present.  Cardiovascular: Normal rate, regular rhythm, normal heart sounds and intact distal pulses.  Exam reveals no gallop and no friction rub.   No murmur heard. Pulmonary/Chest: Effort normal. No accessory muscle usage. No tachypnea. No respiratory distress. She has no decreased breath sounds. She has no wheezes. She has rhonchi (scattered). She has no rales. She exhibits no tenderness.  Musculoskeletal: Normal range of motion. She exhibits no edema or tenderness.  Lymphadenopathy:    She has no cervical adenopathy.  Neurological: She is alert and oriented to person, place, and time. No cranial nerve deficit. She exhibits normal muscle tone. Coordination normal.  Skin: Skin is warm and dry. No rash noted. She is not diaphoretic. No erythema. No pallor.  Psychiatric: She has a normal mood and affect. Her behavior is normal. Judgment and thought content normal.          Assessment & Plan:   Problem List Items Addressed This Visit      Unprioritized   Abnormal chest CT    Enlarged right paratracheal lymph node seen on CT chest 12/2. Will plan repeat chest CT in  11/2014.    CAP (community acquired pneumonia) - Primary    Symptoms improving. Will recheck CXR tomorrow, and CBC and CMP today. Continue Levaquin to complete course. Follow up in 1-2 weeks.    Relevant Medications      levofloxacin (LEVAQUIN) 750 MG tablet   Other Relevant Orders      CBC with Differential        Comprehensive metabolic panel      DG Chest 2 View       Return in about 2 weeks (around 09/22/2014).

## 2014-09-09 ENCOUNTER — Ambulatory Visit (INDEPENDENT_AMBULATORY_CARE_PROVIDER_SITE_OTHER)
Admission: RE | Admit: 2014-09-09 | Discharge: 2014-09-09 | Disposition: A | Discharge status: Home / Self Care | Payer: BC Managed Care – PPO | Source: Ambulatory Visit | Attending: Internal Medicine | Admitting: Internal Medicine

## 2014-09-09 DIAGNOSIS — J189 Pneumonia, unspecified organism: Secondary | ICD-10-CM

## 2014-09-09 LAB — COMPREHENSIVE METABOLIC PANEL
ALT: 22 U/L (ref 0–35)
AST: 31 U/L (ref 0–37)
Albumin: 3.2 g/dL — ABNORMAL LOW (ref 3.5–5.2)
Alkaline Phosphatase: 94 U/L (ref 39–117)
BUN: 12 mg/dL (ref 6–23)
CO2: 21 mEq/L (ref 19–32)
Calcium: 8.9 mg/dL (ref 8.4–10.5)
Chloride: 100 mEq/L (ref 96–112)
Creatinine, Ser: 0.9 mg/dL (ref 0.4–1.2)
GFR: 72.38 mL/min (ref >60.00)
Glucose, Bld: 206 mg/dL — ABNORMAL HIGH (ref 70–99)
Potassium: 4.4 mEq/L (ref 3.5–5.1)
Sodium: 132 mEq/L — ABNORMAL LOW (ref 135–145)
Total Bilirubin: 0.4 mg/dL (ref 0.2–1.2)
Total Protein: 6.8 g/dL (ref 6.0–8.3)

## 2014-09-09 LAB — CBC WITH DIFFERENTIAL/PLATELET
Basophils Absolute: 0 10*3/uL (ref 0.0–0.1)
Basophils Relative: 0.1 % (ref 0.0–3.0)
Eosinophils Absolute: 0.2 10*3/uL (ref 0.0–0.7)
Eosinophils Relative: 1.3 % (ref 0.0–5.0)
HCT: 41.6 % (ref 36.0–46.0)
Hemoglobin: 13.8 g/dL (ref 12.0–15.0)
Lymphocytes Relative: 10.8 % — ABNORMAL LOW (ref 12.0–46.0)
Lymphs Abs: 1.3 10*3/uL (ref 0.7–4.0)
MCHC: 33.2 g/dL (ref 30.0–36.0)
MCV: 88.7 fl (ref 78.0–100.0)
Monocytes Absolute: 1 10*3/uL (ref 0.1–1.0)
Monocytes Relative: 8.6 % (ref 3.0–12.0)
Neutro Abs: 9.2 10*3/uL — ABNORMAL HIGH (ref 1.4–7.7)
Neutrophils Relative %: 79.2 % — ABNORMAL HIGH (ref 43.0–77.0)
Platelets: 319 10*3/uL (ref 150.0–400.0)
RBC: 4.69 Mil/uL (ref 3.87–5.11)
RDW: 15.1 % (ref 11.5–15.5)
WBC: 11.6 10*3/uL — ABNORMAL HIGH (ref 4.0–10.5)

## 2014-09-09 NOTE — Telephone Encounter (Signed)
Can we use a 76min 3 week follow up?

## 2014-09-10 ENCOUNTER — Encounter: Payer: Self-pay | Admitting: Internal Medicine

## 2014-09-10 ENCOUNTER — Encounter: Payer: Self-pay | Admitting: Endocrinology

## 2014-09-12 ENCOUNTER — Encounter: Payer: Self-pay | Admitting: Internal Medicine

## 2014-09-12 ENCOUNTER — Encounter: Payer: Self-pay | Admitting: *Deleted

## 2014-09-13 ENCOUNTER — Encounter: Payer: Self-pay | Admitting: Endocrinology

## 2014-09-13 ENCOUNTER — Ambulatory Visit (INDEPENDENT_AMBULATORY_CARE_PROVIDER_SITE_OTHER): Payer: BC Managed Care – PPO | Admitting: Endocrinology

## 2014-09-13 VITALS — BP 130/82 | HR 84 | Ht 64.5 in | Wt 180.2 lb

## 2014-09-13 DIAGNOSIS — IMO0002 Reserved for concepts with insufficient information to code with codable children: Secondary | ICD-10-CM

## 2014-09-13 DIAGNOSIS — E785 Hyperlipidemia, unspecified: Secondary | ICD-10-CM

## 2014-09-13 DIAGNOSIS — E1165 Type 2 diabetes mellitus with hyperglycemia: Secondary | ICD-10-CM

## 2014-09-13 LAB — HEMOGLOBIN A1C: Hgb A1c MFr Bld: 10.6 % — ABNORMAL HIGH (ref 4.6–6.5)

## 2014-09-13 MED ORDER — INSULIN GLARGINE 100 UNIT/ML SOLOSTAR PEN: 12.0000 [IU] | PEN_INJECTOR | Freq: Every day | SUBCUTANEOUS | Status: DC

## 2014-09-13 MED ORDER — INSULIN PEN NEEDLE 32G X 4 MM MISC: Status: DC

## 2014-09-13 NOTE — Assessment & Plan Note (Signed)
Recent A1c not controlled. Update A1c today.  We are now limited with available options for better sugar control. Discussed basal insulin vs premixed insulin versus basal/bolus. She has elected to try Basal insulin. Will start at Lantus ( didn't tolerate Levemir in the past) at 12 units daily and increase as directed to max dose of 20 units daily based on morning sugars. Discussed at length use of insulin pen, insulin administration.  Continue current Glipizide.  If sugars don't improve then will need additional meal time insulin coverage.   Check sugars 2 times daily.   RTC 2 months

## 2014-09-13 NOTE — Progress Notes (Signed)
Patient ID: Kristina Mccoy, female   DOB: Jun 16, 1956, 58 y.o.   MRN: [id number]   Reason for visit-  Kristina Mccoy is a 58 y.o.-year-old female, here for follow up  of Type 2 diabetes, uncontrolled, without complications. Last seen October 2015.    HPI- Diagnosed in 2000. Recalls being initially on lifestyle modifications.   Prior treatment hx-   - Metformin- didn't tolerate due to GI symptoms - Januvia was taken off due to "scare of side effects".  - well controlled till 2014, till she was dxed with breast cancer. Then during chemotherapy was on levemir and novolog around December 2014.  - Didn't tolerate levemir, and stopped August 2015- due to feeling bloated with medication and skin itching.  - ? Rash/Hives with prior Novolog use.  -Glyburide switched to Glipizide at visit in 05/2014 - Invokana started 05/2014 - has had 2 yeast infections since then and med stopped October 2015 -Pt never started Victoza per discussion Oct 2015 due to fear of side effects due to Nausea/cancer risk * recently hospitalized 12/2-12/7 for CAP, treated with levaquin, steroids, and insulin injections while in hospital. 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  she has hypoglycemia awareness at 58.    Dietary habits- eats three times daily. Tries to limit carbs, sweetened beverages, sodas, desserts. Not eating sweets and junk foods. Usual foods include Oatmeal, salad, [place] on wheat bread or a sandwich, chicken, veggies. Back to  measuring foods. Hasn't done too well as overall hasn't been feeling good.  Exercise- used to walk for exercise mainly. Had started more intense exercise, but had to stop it recently due to joint pain. Also, SOB/recent hospitalization limits her exercise ability.  Weight - decreasing  Wt Readings from Last 3 Encounters:  09/13/14 180 lb 4 oz (81.761 kg)  09/08/14 184 lb 8 oz (83.689 kg)  08/16/14 188 lb (85.276 kg)    Diabetes Complications- No known complications Nephropathy-- No  CKD, last BUN/creatinine- Lab Results  Component Value Date   BUN 12 09/08/2014   CREATININE 0.9 09/08/2014    Lab Results  Component Value Date   GFR 72.38 09/08/2014   Lab Results  Component Value Date   MICRALBCREAT 1.4 05/04/2014    Retinopathy- No, Last DEE- Nov 2015. Neuropathy-  Reports numbness and tingling in her toes and hands. No known neuropathy.  Associated history - No history of CAD or prior stroke. Has PVD. No hypothyroidism. her last TSH was  Lab Results  Component Value Date   TSH 0.63 05/04/2014     Blood Pressure- Patient's blood pressure is well controlled today.  Hyperlipidemia- her last set of lipids were- Lab Results  Component Value Date   CHOL 210* 05/04/2014   HDL 44.90 05/04/2014   LDLCALC 115* 01/24/2014   LDLDIRECT 155.3 05/04/2014   TRIG 239.0* 05/04/2014   CHOLHDL 5 05/04/2014   I have reviewed the patient's past medical history,  medications and  allergies.   Current Outpatient Prescriptions on File Prior to Visit  Medication Sig Dispense Refill  . ALPRAZolam (XANAX) 0.5 MG tablet Take 1 tablet (0.5 mg total) by mouth 4 (four) times daily as needed for anxiety. 120 tablet 1  . cetirizine (ZYRTEC) 10 MG tablet Take 10 mg by mouth 2 (two) times daily.    . clobetasol ointment (TEMOVATE) 7.62 % Apply 1 application topically 2 (two) times daily.    . Emollient (CERAVE) CREA Apply topically.    Marland Kitchen glipiZIDE (GLUCOTROL) 5 MG tablet Take 2 tablets (10 mg total)  by mouth 2 (two) times daily before a meal. 120 tablet 3  . glucose blood (FREESTYLE LITE) test strip Use as instructed 100 each 5  . hydrOXYzine (ATARAX/VISTARIL) 25 MG tablet Take 25 mg by mouth daily.    . hyoscyamine (LEVSIN, ANASPAZ) 0.125 MG tablet Take 1 tablet (0.125 mg total) by mouth every 4 (four) hours as needed. 30 tablet 0  . Lancets (FREESTYLE) lancets Use as instructed 100 each 5  . LORAZEPAM PO Take by mouth. Take 2nd for nausea    . oxyCODONE (OXY IR/ROXICODONE) 5 MG immediate release tablet Take 1 tablet (5 mg total) by mouth every 6 (six) hours as needed for severe pain. 120 tablet 0  . pantoprazole (PROTONIX) 40 MG tablet Take 1 tablet (40 mg total) by mouth 2 (two) times daily. 60 tablet 3  . PARoxetine (PAXIL) 10 MG tablet Take 1 tablet (10 mg total) by mouth daily. 30 tablet 0  . prochlorperazine (COMPAZINE) 10 MG tablet Take 10 mg by mouth every 6 (six) hours as needed.    . ranitidine (ZANTAC) 300 MG tablet Take 1 tablet (300 mg total) by mouth daily. 90 tablet 3  . tamoxifen (NOLVADEX) 20 MG tablet Take 20 mg by mouth daily.     No current facility-administered medications on file prior to visit.   Allergies  Allergen Reactions  . Gabapentin Itching and Swelling  . Methylprednisolone     Reddened face, puffy face   . Morphine And Related   . Pregabalin Swelling    Swelling in arms and legs    Review of Systems- [ x ]  Complains of    [  ]  denies [  ] Recent weight change [ x] fatigue [  ]  vision difficulty [ x ] chest soreness with deep inspiration [ x ] shortness of breath, getting better [x  ] nausea/ no vomiting [  ] diarrhea [  ] constipation [  ] abdominal pain [ x ]  tingling/numbness in extremities [  ]  concern with feet ( wounds/sores)   PE: BP 130/82 mmHg  Pulse 84  Ht 5' 4.5" (1.638 m)  Wt 180 lb 4 oz (81.761 kg)  BMI 30.47 kg/m2  SpO2 97% Wt Readings from Last 3 Encounters:  09/13/14 180 lb 4 oz (81.761 kg)  09/08/14 184 lb 8 oz  (83.689 kg)  08/16/14 188 lb (85.276 kg)   HEENT: [place]/AT, EOMI, no icterus, no proptosis, no chemosis, no mild lid lag, no retraction, eyes close completely Lungs: good air entry, clear bilaterally Heart: S1&S2 normal, regular rate & rhythm; no murmurs, rubs or gallops Ext: no tremor in hands bilaterally, no edema, 2+ DP/PT pulses, good muscle mass    ASSESSMENT: 1. Type 2 diabetes, uncontrolled 2. Hyperlipidemia  Problem List Items Addressed This Visit      Other   Hyperlipidemia    Recent lipids not controlled. Low  fat diet and exercise.  She will be a candidate to start statin therapy based on her last lipids. However, she is dealing with rheumatological issues lately, recent discharge from hospital, and having tolerance issues with DM medications, so I would prefer for her to start statin therapy once she gets better with these factors.   Will reassess at next visit.         Diabetes type 2, uncontrolled - Primary    Recent A1c not controlled. Update A1c today.  We are now limited with available options for better sugar control. Discussed basal insulin vs premixed insulin versus basal/bolus. She has elected to try Basal insulin. Will start at Lantus ( didn't tolerate Levemir in the past) at 12 units daily and increase as directed to max dose of 20 units daily based on morning sugars. Discussed at length use of insulin pen, insulin administration.  Continue current Glipizide.  If sugars don't improve then will need additional meal time insulin coverage.   Check sugars 2 times daily.   RTC 2 months      Relevant Medications      LANTUS SOLOSTAR 100 UNIT/ML [place] SOLN      Insulin Pen Needle (BD PEN NEEDLE NANO U/F) 32G X 4 MM MISC   Other Relevant Orders      Hemoglobin A1c      - Return to clinic in 2 mo with sugar log/meter.  Heatherly Stenner [facility] 09/13/2014 12:03 PM

## 2014-09-13 NOTE — Progress Notes (Signed)
Pre visit review using our clinic review tool, if applicable. No additional management support is needed unless otherwise documented below in the visit note. 

## 2014-09-13 NOTE — Patient Instructions (Addendum)
Check sugars 2 x daily ( before breakfast and before supper).  Record them in a log book and bring that/meter to next appointment.   Start lantus at 12 units [place] daily at bedtime. Check morning fasting sugars. If sugars are elevated >135 in the morning, then can increase lantus by 2 units every 3 days till max dose of 20 units daily.   Continue current Glipizide 10 mg twice daily.   A1c today. Please come back for a follow-up appointment in 2 months.

## 2014-09-13 NOTE — Assessment & Plan Note (Signed)
Recent lipids not controlled. Low fat diet and exercise.  She will be a candidate to start statin therapy based on her last lipids. However, she is dealing with rheumatological issues lately, recent discharge from hospital, and having tolerance issues with DM medications, so I would prefer for her to start statin therapy once she gets better with these factors.   Will reassess at next visit.

## 2014-09-15 ENCOUNTER — Other Ambulatory Visit: Payer: Self-pay | Admitting: Internal Medicine

## 2014-09-15 ENCOUNTER — Ambulatory Visit (INDEPENDENT_AMBULATORY_CARE_PROVIDER_SITE_OTHER)
Admission: RE | Admit: 2014-09-15 | Discharge: 2014-09-15 | Disposition: A | Discharge status: Home / Self Care | Payer: BC Managed Care – PPO | Source: Ambulatory Visit | Attending: Internal Medicine | Admitting: Internal Medicine

## 2014-09-15 DIAGNOSIS — J189 Pneumonia, unspecified organism: Secondary | ICD-10-CM

## 2014-09-21 ENCOUNTER — Encounter: Payer: Self-pay | Admitting: Internal Medicine

## 2014-09-22 ENCOUNTER — Encounter: Payer: Self-pay | Admitting: Internal Medicine

## 2014-09-26 ENCOUNTER — Other Ambulatory Visit: Payer: Self-pay | Admitting: Internal Medicine

## 2014-10-03 ENCOUNTER — Encounter: Payer: Self-pay | Admitting: Internal Medicine

## 2014-10-04 ENCOUNTER — Ambulatory Visit (INDEPENDENT_AMBULATORY_CARE_PROVIDER_SITE_OTHER): Payer: BLUE CROSS/BLUE SHIELD | Admitting: Internal Medicine

## 2014-10-04 ENCOUNTER — Encounter: Payer: Self-pay | Admitting: Internal Medicine

## 2014-10-04 VITALS — BP 114/76 | HR 91 | Temp 97.7°F | Ht 64.5 in | Wt 180.5 lb

## 2014-10-04 DIAGNOSIS — R0600 Dyspnea, unspecified: Secondary | ICD-10-CM

## 2014-10-04 DIAGNOSIS — R0689 Other abnormalities of breathing: Secondary | ICD-10-CM

## 2014-10-04 DIAGNOSIS — R0602 Shortness of breath: Secondary | ICD-10-CM

## 2014-10-04 LAB — CBC WITH DIFFERENTIAL/PLATELET
Basophils Absolute: 0 10*3/uL (ref 0.0–0.1)
Basophils Relative: 0.5 % (ref 0.0–3.0)
Eosinophils Absolute: 0.3 10*3/uL (ref 0.0–0.7)
Eosinophils Relative: 3.7 % (ref 0.0–5.0)
HCT: 44.2 % (ref 36.0–46.0)
Hemoglobin: 14.5 g/dL (ref 12.0–15.0)
Lymphocytes Relative: 18.8 % (ref 12.0–46.0)
Lymphs Abs: 1.5 10*3/uL (ref 0.7–4.0)
MCHC: 32.9 g/dL (ref 30.0–36.0)
MCV: 88.8 fl (ref 78.0–100.0)
Monocytes Absolute: 0.8 10*3/uL (ref 0.1–1.0)
Monocytes Relative: 9.8 % (ref 3.0–12.0)
Neutro Abs: 5.4 10*3/uL (ref 1.4–7.7)
Neutrophils Relative %: 67.2 % (ref 43.0–77.0)
Platelets: 229 10*3/uL (ref 150.0–400.0)
RBC: 4.98 Mil/uL (ref 3.87–5.11)
RDW: 14.9 % (ref 11.5–15.5)
WBC: 8 10*3/uL (ref 4.0–10.5)

## 2014-10-04 LAB — COMPREHENSIVE METABOLIC PANEL
ALT: 26 U/L (ref 0–35)
AST: 25 U/L (ref 0–37)
Albumin: 4 g/dL (ref 3.5–5.2)
Alkaline Phosphatase: 106 U/L (ref 39–117)
BUN: 12 mg/dL (ref 6–23)
CO2: 29 mEq/L (ref 19–32)
Calcium: 9.2 mg/dL (ref 8.4–10.5)
Chloride: 99 mEq/L (ref 96–112)
Creatinine, Ser: 0.8 mg/dL (ref 0.4–1.2)
GFR: 74.35 mL/min (ref >60.00)
Glucose, Bld: 142 mg/dL — ABNORMAL HIGH (ref 70–99)
Potassium: 3.8 mEq/L (ref 3.5–5.1)
Sodium: 135 mEq/L (ref 135–145)
Total Bilirubin: 0.5 mg/dL (ref 0.2–1.2)
Total Protein: 7.7 g/dL (ref 6.0–8.3)

## 2014-10-04 LAB — TROPONIN I: TNIDX: 0 ug/l (ref 0.00–0.06)

## 2014-10-04 LAB — BRAIN NATRIURETIC PEPTIDE: Pro B Natriuretic peptide (BNP): 24 pg/mL (ref 0.0–100.0)

## 2014-10-04 NOTE — Assessment & Plan Note (Addendum)
Worsening dyspnea and right sided pleuritic chest pain. Exam is normal. She recently had CAD with bilateral pleural effusions, however these were improving on recent CXR. Will repeat a CXR. EKG normal today. Will check CBC, CMP, Troponin, BNP, D-dimer. We discussed if D-Dimer is pos, checking CT chest for PE. She was recently treated for breast cancer, so some increased risk PE. Follow up with results of labs and CXR and also on Friday as scheduled.

## 2014-10-04 NOTE — Progress Notes (Signed)
Subjective:    Patient ID: Kristina Mccoy, female    DOB: 03-04-1956, 59 y.o.   MRN: [id number]  HPI 59YO female presents for acute visit.  Since end of December has felt short of breath, with pain in right upper chest when she takes a deep breath. This pain radiates through back. Feels difficult to catch breath even when sitting. Occasional chills. No fever. Occasional dry cough. Not productive. Not taking anything for symptoms.  Past medical, surgical, family and social history per today's encounter.  Review of Systems  Constitutional: Positive for fatigue. Negative for fever, chills, appetite change and unexpected weight change.  Eyes: Negative for visual disturbance.  Respiratory: Positive for cough, chest tightness and shortness of breath. Negative for wheezing.   Cardiovascular: Positive for chest pain (right sided). Negative for leg swelling.  Gastrointestinal: Negative for abdominal pain.  Skin: Negative for color change and rash.  Hematological: Negative for adenopathy. Does not bruise/bleed easily.  Psychiatric/Behavioral: Negative for dysphoric mood. The patient is not nervous/anxious.        Objective:    BP 114/76 mmHg  Pulse 91  Temp(Src) 97.7 F (36.5 C) (Oral)  Ht 5' 4.5" (1.638 m)  Wt 180 lb 8 oz (81.874 kg)  BMI 30.52 kg/m2  SpO2 100% Physical Exam  Constitutional: She is oriented to person, place, and time. She appears well-developed and well-nourished. No distress.  HENT:  Head: Normocephalic and atraumatic.  Right Ear: External ear normal.  Left Ear: External ear normal.  Nose: Nose normal.  Mouth/Throat: Oropharynx is clear and moist. No oropharyngeal exudate.  Eyes: Conjunctivae are normal. Pupils are equal, round, and reactive to light. Right eye exhibits no discharge. Left eye exhibits no discharge. No scleral icterus.  Neck: Normal range of motion. Neck supple. No tracheal deviation present. No thyromegaly present.  Cardiovascular: Normal  rate, regular rhythm, normal heart sounds and intact distal pulses.  Exam reveals no gallop and no friction rub.   No murmur heard. Pulmonary/Chest: Effort normal and breath sounds normal. No accessory muscle usage. No tachypnea. No respiratory distress. She has no decreased breath sounds. She has no wheezes. She has no rhonchi. She has no rales. She exhibits no tenderness.  Musculoskeletal: Normal range of motion. She exhibits no edema or tenderness.  Lymphadenopathy:    She has no cervical adenopathy.  Neurological: She is alert and oriented to person, place, and time. No cranial nerve deficit. She exhibits normal muscle tone. Coordination normal.  Skin: Skin is warm and dry. No rash noted. She is not diaphoretic. No erythema. No pallor.  Psychiatric: Her behavior is normal. Judgment and thought content normal. Her mood appears anxious.          Assessment & Plan:  Over 99min of which >50% spent in face-to-face contact with patient discussing plan of care  Problem List Items Addressed This Visit      Unprioritized   Dyspnea and respiratory abnormality - Primary    Worsening dyspnea and right sided pleuritic chest pain. Exam is normal. She recently had CAD with bilateral pleural effusions, however these were improving on recent CXR. Will repeat a CXR. EKG normal today. Will check CBC, CMP, Troponin, BNP, D-dimer. We discussed if D-Dimer is pos, checking CT chest for PE. She was recently treated for breast cancer, so some increased risk PE. Follow up with results of labs and CXR and also on Friday as scheduled.    Relevant Orders      DG Chest 2  View      CBC w/Diff      Comprehensive metabolic panel      D-Dimer, Quantitative      B Nat Peptide      Troponin I    Other Visit Diagnoses    SOB (shortness of breath)        Relevant Orders       EKG 12-Lead (Completed)        Return in about 1 week (around 10/11/2014) for Recheck.

## 2014-10-04 NOTE — Progress Notes (Signed)
Pre visit review using our clinic review tool, if applicable. No additional management support is needed unless otherwise documented below in the visit note. 

## 2014-10-04 NOTE — Patient Instructions (Addendum)
Labs today.  Chest xray at [facility] today or tomorrow.  Follow up in 1 week.

## 2014-10-05 ENCOUNTER — Ambulatory Visit: Payer: Self-pay | Admitting: Internal Medicine

## 2014-10-05 LAB — D-DIMER, QUANTITATIVE (NOT AT ARMC): D-Dimer, Quant: 0.69 ug/mL-FEU — ABNORMAL HIGH (ref 0.00–0.48)

## 2014-10-05 NOTE — Addendum Note (Signed)
Addended by: Ronette Deter A on: 10/05/2014 06:58 AM   Modules accepted: Orders

## 2014-10-06 ENCOUNTER — Telehealth: Payer: Self-pay | Admitting: Internal Medicine

## 2014-10-06 NOTE — Telephone Encounter (Signed)
CT Chest was normal

## 2014-10-07 ENCOUNTER — Ambulatory Visit (INDEPENDENT_AMBULATORY_CARE_PROVIDER_SITE_OTHER): Payer: BLUE CROSS/BLUE SHIELD | Admitting: Internal Medicine

## 2014-10-07 ENCOUNTER — Encounter: Payer: Self-pay | Admitting: Internal Medicine

## 2014-10-07 VITALS — BP 118/81 | HR 86 | Temp 97.5°F | Ht 64.5 in | Wt 184.0 lb

## 2014-10-07 DIAGNOSIS — G894 Chronic pain syndrome: Secondary | ICD-10-CM

## 2014-10-07 DIAGNOSIS — R0689 Other abnormalities of breathing: Secondary | ICD-10-CM

## 2014-10-07 DIAGNOSIS — R0600 Dyspnea, unspecified: Secondary | ICD-10-CM

## 2014-10-07 MED ORDER — INSULIN DETEMIR 100 UNIT/ML ~~LOC~~ SOLN: 12.0000 [IU] | Freq: Every day | SUBCUTANEOUS | Status: DC

## 2014-10-07 MED ORDER — ALBUTEROL SULFATE HFA 108 (90 BASE) MCG/ACT IN AERS: 2.0000 | INHALATION_SPRAY | Freq: Four times a day (QID) | RESPIRATORY_TRACT | Status: DC | PRN

## 2014-10-07 MED ORDER — OXYCODONE HCL 5 MG PO TABS: 5.0000 mg | ORAL_TABLET | Freq: Four times a day (QID) | ORAL | Status: DC | PRN

## 2014-10-07 NOTE — Progress Notes (Signed)
Pre visit review using our clinic review tool, if applicable. No additional management support is needed unless otherwise documented below in the visit note. 

## 2014-10-07 NOTE — Assessment & Plan Note (Signed)
Persistent symptoms of dyspnea. Exam normal. Recent chest CT normal. Will set up pulmonary evaluation for PFTs. Will start prn Albuterol to see if any improvement in symptoms.

## 2014-10-07 NOTE — Progress Notes (Signed)
Subjective:    Patient ID: Kristina Mccoy, female    DOB: July 07, 1956, 59 y.o.   MRN: [id number]  HPI 59YO female presents for follow up.  Seen 10/04/2014 for dyspnea. CT angio was normal with no evidence of PE. Continues to feel short of breath even at rest. Occasional dry cough. No fever, chills. Last ECHO 05/2014 was normal and recent BNP normal. Pt reports she was diagnosed at [facility] with COPD, however has never had PFTs to confirm this. She is a former smoker.  Persistent severe arthralgia and myalgia. Evaluation with rheumatology pending. Symptoms improved with prn Oxycodone for severe pain only. No side effects noted.  Past medical, surgical, family and social history per today's encounter.  Review of Systems  Constitutional: Positive for fatigue. Negative for fever, chills, appetite change and unexpected weight change.  Eyes: Negative for visual disturbance.  Respiratory: Positive for cough and shortness of breath. Negative for chest tightness and wheezing.   Cardiovascular: Negative for chest pain and leg swelling.  Gastrointestinal: Negative for abdominal pain.  Musculoskeletal: Positive for myalgias and arthralgias.  Skin: Negative for color change and rash.  Hematological: Negative for adenopathy. Does not bruise/bleed easily.  Psychiatric/Behavioral: Negative for dysphoric mood. The patient is not nervous/anxious.        Objective:    BP 118/81 mmHg  Pulse 86  Temp(Src) 97.5 F (36.4 C) (Oral)  Ht 5' 4.5" (1.638 m)  Wt 184 lb (83.462 kg)  BMI 31.11 kg/m2  SpO2 99% Physical Exam  Constitutional: She is oriented to person, place, and time. She appears well-developed and well-nourished. No distress.  HENT:  Head: Normocephalic and atraumatic.  Right Ear: External ear normal.  Left Ear: External ear normal.  Nose: Nose normal.  Mouth/Throat: Oropharynx is clear and moist. No oropharyngeal exudate.  Eyes: Conjunctivae are normal. Pupils are equal, round, and  reactive to light. Right eye exhibits no discharge. Left eye exhibits no discharge. No scleral icterus.  Neck: Normal range of motion. Neck supple. No tracheal deviation present. No thyromegaly present.  Cardiovascular: Normal rate, regular rhythm, normal heart sounds and intact distal pulses.  Exam reveals no gallop and no friction rub.   No murmur heard. Pulmonary/Chest: Effort normal and breath sounds normal. No accessory muscle usage. No tachypnea. No respiratory distress. She has no decreased breath sounds. She has no wheezes. She has no rhonchi. She has no rales. She exhibits no tenderness.  Musculoskeletal: Normal range of motion. She exhibits no edema or tenderness.  Lymphadenopathy:    She has no cervical adenopathy.  Neurological: She is alert and oriented to person, place, and time. No cranial nerve deficit. She exhibits normal muscle tone. Coordination normal.  Skin: Skin is warm and dry. No rash noted. She is not diaphoretic. No erythema. No pallor.  Psychiatric: Her behavior is normal. Judgment and thought content normal. Her mood appears anxious.          Assessment & Plan:   Problem List Items Addressed This Visit      Unprioritized   Chronic pain disorder    Chronic diffuse arthralgia, which existed prior to chemotherapy, but has been much worse after cancer treatment. Rheumatology evaluation pending. Labs remarkable for pos ANA and SSA. Suspect that autoimmune disease playing a role in symptoms. Will continue prn Oxycodone for severe pain only. Will need referral to chronic pain management if symptoms persist to require narcotic pain meds.    Dyspnea and respiratory abnormality - Primary    Persistent  symptoms of dyspnea. Exam normal. Recent chest CT normal. Will set up pulmonary evaluation for PFTs. Will start prn Albuterol to see if any improvement in symptoms.    Relevant Medications      albuterol (PROVENTIL HFA;VENTOLIN HFA) inhaler   Other Relevant Orders       Ambulatory referral to Pulmonology       Return in about 4 weeks (around 11/04/2014) for Recheck.

## 2014-10-07 NOTE — Patient Instructions (Addendum)
We will set up evaluation with Dr. Stevenson Clinch in pulmonary.  Start Albuterol inhaler 2 puffs ever 6 hours as needed for shortness of breath.  Stop Lantus and change to Levemir 12units daily.  Follow up in 4 weeks.

## 2014-10-07 NOTE — Assessment & Plan Note (Addendum)
Chronic diffuse arthralgia, which existed prior to chemotherapy, but has been much worse after cancer treatment. Rheumatology evaluation pending. Labs remarkable for pos ANA and SSA. Suspect that autoimmune disease playing a role in symptoms. Will continue prn Oxycodone for severe pain only. Will need referral to chronic pain management if symptoms persist to require narcotic pain meds.

## 2014-10-11 ENCOUNTER — Institutional Professional Consult (permissible substitution): Payer: Self-pay | Admitting: Internal Medicine

## 2014-10-13 ENCOUNTER — Ambulatory Visit: Payer: BC Managed Care – PPO | Admitting: Internal Medicine

## 2014-10-19 ENCOUNTER — Ambulatory Visit (INDEPENDENT_AMBULATORY_CARE_PROVIDER_SITE_OTHER): Payer: BLUE CROSS/BLUE SHIELD | Admitting: Internal Medicine

## 2014-10-19 ENCOUNTER — Encounter: Payer: Self-pay | Admitting: Internal Medicine

## 2014-10-19 VITALS — BP 116/70 | HR 80 | Temp 97.9°F | Ht 64.5 in | Wt 185.0 lb

## 2014-10-19 DIAGNOSIS — Z9989 Dependence on other enabling machines and devices: Secondary | ICD-10-CM

## 2014-10-19 DIAGNOSIS — G4733 Obstructive sleep apnea (adult) (pediatric): Secondary | ICD-10-CM

## 2014-10-19 DIAGNOSIS — R0600 Dyspnea, unspecified: Secondary | ICD-10-CM

## 2014-10-19 NOTE — Progress Notes (Signed)
Date: 10/19/2014  MRN# [id number] Kristina Mccoy 1956-09-08  Referring Physician: Dr. Ronette Deter  Kristina Mccoy is a 59 y.o. old female seen in consultation for dyspnea  CC:  Chief Complaint  Patient presents with  . Advice Only    pt referred by Dr. Gilford Rile dyspnea: Pt was hospitalized Dec 2-7 for pneumonia. She still has sob, sl. cough with mucus, no wheezing, Pt has pain in chest and back if she takes a deep breath.    HPI:  Patient is a pleasant 59 year old female who was referred for dyspnea by her primary care physician Dr. Ronette Deter. Patient has a complex medical history of breast cancer, Sjorgen disease, fibromyalgia, obstructive sleep apnea, obesity, diabetes and hyperlipidemia. Patient states she was recently admitted to the hospital in December 2015 since then she has not been able to get back to baseline breathing, she has dyspnea at rest, which is slowly getting better. 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 Radiation was done with 27 doses over 7 weeks, with last dose being in March of 2015. She was also noted to have exertional dyspnea in 2015, evaluated by cardiology in August of 2015, had an echocardiogram done, which was essentially normal. At that visit a nuclear stress test (pharmacologic) was discussed with the patient given her recent radiation and chemotherapy, however the patient declined stress testing due to financial reasons. In December 2015 patient was noted to have progressive shortness of breath and chest pain, she presented herself to the ER and was noted to have elevated troponin levels, at that time she also had a CTA chest which showed multifocal pneumonia (right upper lobe and left lower lobe); she was treated with antibiotics  for a week and discharged (see hospital summary below). Since this hospitalization patient states that she has had dyspnea mostly at rest, worse with laying flat or with a lot of speaking, she states that the dyspnea is slowly improving and she has a mild "tickling" cough that is nonproductive. She complained of back pain which shortness of breath and had a followup CTA chest in January 2016 which was negative for PE, and showed no abnormalities except right upper lobe opacity seen on prior CTA chest. Today patient states that she can walk to the car, check mail, performing daily chores with mild dyspnea, however she was able to do more prior to hospitalization in December 2015. Patient states she has a history of sleep apnea, diagnosed in 2009 (Dr. Humphrey Rolls), was placed on CPAP and oxygen 2.5 L at night, however, she has not had any followup for sleep apnea since that period. Smoking history-quit June 2014, previously smoked one pack per day x34 years.    Hospital Chart Review DO[facility] - 08/31/14 Discharge Date 09/05/14  Hospital Summary  The patient is a 59 year old white female who presented with the complaint of chest pain and shortness of breath. She had a CT of the chest which confirmed findings of pneumonia. The patient was treated with antibiotics and she continued to have symptoms. She was slow to improve; however, she finally did improve. The patient also had slightly elevated troponin and was seen by cardiology. They recommended outpatient followup. Currently, her respiratory status is normalized and doing well and is stable. BNP=3300 PMHX:   Past Medical History  Diagnosis Date  . Otitis media, chronic   .  Diabetes mellitus   . Fibromyalgia   . Breast cancer 5/14    left breast  . Lymphedema of arm     right  . Hyperlipidemia   . Heart murmur    Surgical Hx:  Past Surgical History  Procedure Laterality Date  . Mastectomy Bilateral 03/23/13  . Breast surgery     Family Hx:  Family  History  Problem Relation Age of Onset  . Diabetes Mother   . Heart disease Mother   . Hyperlipidemia Mother   . Heart failure Mother   . Heart disease Brother   . Hyperlipidemia Brother   . Diabetes Other    Social Hx:   History  Substance Use Topics  . Smoking status: Former Smoker -- 0.50 packs/day    Types: Cigarettes    Quit date: 02/28/2013  . Smokeless tobacco: Never Used  . Alcohol Use: No   Medication:   Current Outpatient Rx  Name  Route  Sig  Dispense  Refill  . albuterol (PROVENTIL HFA;VENTOLIN HFA) 108 (90 BASE) MCG/ACT inhaler   Inhalation   Inhale 2 puffs into the lungs every 6 (six) hours as needed for wheezing or shortness of breath.   1 Inhaler   2   . ALPRAZolam (XANAX) 0.5 MG tablet   Oral   Take 1 tablet (0.5 mg total) by mouth 4 (four) times daily as needed for anxiety.   120 tablet   1   . BAYER CONTOUR NEXT TEST test strip      CHECK BLOOD SUGAR SIX TIMES PER DAY AS DIRECTED   100 each   5   . cetirizine (ZYRTEC) 10 MG tablet   Oral   Take 10 mg by mouth 2 (two) times daily.         . clobetasol ointment (TEMOVATE) 0.05 %   Topical   Apply 1 application topically 2 (two) times daily.         . Emollient (CERAVE) CREA   Apply externally   Apply topically.         Marland Kitchen glipiZIDE (GLUCOTROL) 5 MG tablet   Oral   Take 2 tablets (10 mg total) by mouth 2 (two) times daily before a meal.   120 tablet   3   . hydrOXYzine (ATARAX/VISTARIL) 25 MG tablet   Oral   Take 25 mg by mouth daily.         . hyoscyamine (LEVSIN, ANASPAZ) 0.125 MG tablet   Oral   Take 1 tablet (0.125 mg total) by mouth every 4 (four) hours as needed.   30 tablet   0     Any further refills will require an appointment.   . insulin detemir (LEVEMIR) 100 UNIT/ML injection   Subcutaneous   Inject 0.12 mLs (12 Units total) into the skin at bedtime.   10 mL   11   . Insulin Pen Needle (BD PEN NEEDLE NANO U/F) 32G X 4 MM MISC      Use for insulin  administration once daily   30 each   11   . Lancets (FREESTYLE) lancets      USE TWICE A DAY   100 each   5   . LORAZEPAM PO   Oral   Take by mouth. Take 2nd for nausea         . oxyCODONE (OXY IR/ROXICODONE) 5 MG immediate release tablet   Oral   Take 1 tablet (5 mg total) by mouth every 6 (six) hours as  needed for severe pain.   120 tablet   0   . pantoprazole (PROTONIX) 40 MG tablet   Oral   Take 1 tablet (40 mg total) by mouth 2 (two) times daily.   60 tablet   3   . PARoxetine (PAXIL) 10 MG tablet   Oral   Take 1 tablet (10 mg total) by mouth daily.   30 tablet   0   . prochlorperazine (COMPAZINE) 10 MG tablet   Oral   Take 10 mg by mouth every 6 (six) hours as needed.         . ranitidine (ZANTAC) 300 MG tablet   Oral   Take 1 tablet (300 mg total) by mouth daily.   90 tablet   3   . tamoxifen (NOLVADEX) 20 MG tablet   Oral   Take 20 mg by mouth daily.         . insulin glargine (LANTUS) 100 UNIT/ML injection   Subcutaneous   Inject 12 Units into the skin at bedtime.             Allergies:  Gabapentin; Buprenorphine hcl; Methylprednisolone; Morphine and related; and Pregabalin  Review of Systems: Gen:  Denies  fever, sweats, chills HEENT: Denies blurred vision, double vision, ear pain, eye pain, hearing loss, nose bleeds, sore throat Cvc:  No dizziness, chest pain or heaviness Resp:   Denies cough or sputum porduction, shortness of breath Gi: Denies swallowing difficulty, stomach pain, nausea or vomiting, diarrhea, constipation, bowel incontinence Gu:  Denies bladder incontinence, burning urine Ext:   No Joint pain, stiffness or swelling Skin: No skin rash, easy bruising or bleeding or hives Endoc:  No polyuria, polydipsia , polyphagia or weight change Psych: No depression, insomnia or hallucinations  Other:  All other systems negative  Physical Examination:   VS: BP 116/70 mmHg  Pulse 80  Temp(Src) 97.9 F (36.6 C) (Oral)  Ht  5' 4.5" (1.638 m)  Wt 185 lb (83.915 kg)  BMI 31.28 kg/m2  SpO2 97%  General Appearance: No distress  Neuro:without focal findings, mental status, speech normal, alert and oriented, cranial nerves 2-12 intact, reflexes normal and symmetric, sensation grossly normal  HEENT: PERRLA, EOM intact, no ptosis, no other lesions noticed; Mallampati 2 Pulmonary: normal breath sounds., diaphragmatic excursion normal.No wheezing, No rales;   Sputum Production:  none CardiovascularNormal S1,S2.  No m/r/g.  Abdominal aorta pulsation normal.    Abdomen: Benign, Soft, non-tender, No masses, hepatosplenomegaly, No lymphadenopathy Renal:  No costovertebral tenderness  GU:  No performed at this time. Endoc: No evident thyromegaly, no signs of acromegaly or Cushing features Skin:   warm, no rashes, no ecchymosis  Extremities: normal, no cyanosis, clubbing, warm with normal capillary refill.  Mild pitting edema (trace in bilateral LE, R>L) Labs results:   Rad results: (The following images and results were reviewed by Dr. Stevenson Clinch). CTA Chest 08/31/14 No pneumothorax is noted. Mild bilateral pleural effusions are noted with adjacent subsegmental atelectasis. Probable subsegmental atelectasis is noted in the left upper lobe. Opacity is noted in the right upper lobe consistent with pneumonia or subsegmental atelectasis. There is no evidence of pulmonary embolus. Thoracic aorta appears normal. Visualized portion of upper abdomen appears normal. No significant osseous abnormality is noted. 16 x 15 mm enlarged right peritracheal lymph node is noted which is new since prior exam. This may be inflammatory in origin, but neoplasm cannot be excluded. Review of the MIP images confirms the above findings.   IMPRESSION: No  evidence of pulmonary embolus.  Mild bilateral pleural effusions are noted with adjacent subsegmental atelectasis. Probable subsegmental atelectasis seen medially in the left upper lobe. Right  upper lobe pneumonia or subsegmental atelectasis is noted.  Enlarged right peritracheal lymph node is noted which is new since prior exam; this may be inflammatory in origin, but followup CT scan in 3 months is recommended to ensure stability or resolution.  CTA Chest 10/05/14 Shortness of breath with right-sided chest pain and back pain for 2 weeks. Elevated D-dimer. History of left breast cancer status post mastectomy, chemotherapy, and radiation therapy. History of emphysema and diabetes.  EXAM: CT ANGIOGRAPHY CHEST WITH CONTRAST  TECHNIQUE: Multidetector CT imaging of the chest was performed using the standard protocol during bolus administration of intravenous contrast. Multiplanar CT image reconstructions and MIPs were obtained to evaluate the vascular anatomy.  CONTRAST:  100 mL Omnipaque 350  COMPARISON:  08/31/2014  FINDINGS: Pulmonary arterial opacification is adequate without filling defects seen to indicate pulmonary emboli. Heart size is normal. No significant coronary artery calcification is identified on this contrast-enhanced examination. Surgical clips are noted in the right axilla. No enlarged axillary, mediastinal, or hilar lymph nodes are identified. Mildly enlarged right paratracheal lymph node on the prior study has decreased in size, now measuring 7 mm in short axis. There is no pleural or pericardial effusion. There is improved aeration of the lungs bilaterally. Residual subpleural opacities in the right upper lobe likely reflect sequelae of prior radiation therapy. Minimal scarring/ atelectasis is noted in the lingula. No lung nodules are identified. Airway is unremarkable.  7 mm hypodensity in the right hepatic lobe is unchanged and too small to fully characterize but likely represents a cyst or biliary hamartoma. Diffuse osteophytosis is noted throughout the thoracic spine. Review of the MIP images confirms the above findings.   IMPRESSION: No  evidence of pulmonary emboli or other acute abnormality in the chest.   ECHO 05/2014 Study Conclusions  - Left ventricle: The cavity size was normal. Systolic function was normal. The estimated ejection fraction was in the range of 60% to 65%. Wall motion was normal; there were no regional wall motion abnormalities. Doppler parameters are consistent with abnormal left ventricular relaxation (grade 1 diastolic dysfunction). - Left atrium: The atrium was normal in size. - Right ventricle: Systolic function was normal. - Pulmonary arteries: Systolic pressure was within the normal range.  Impressions:  - Normal study.    Assessment and Plan: OSA on CPAP Patient with known history of sleep apnea, no followup in the past 7 years. Wears a CPAP at home with 2.5 L O2 bleed in. In the last 7 years has had multiple weight fluctuations and states has a lot of air from mask coming out at night. Wear his CPAP nightly.   OSA  Encouraged proper weight management.  Excessive weight may contribute to snoring.  Monitor sedative use.  Discussed driving precautions and its relationship with hypersomnolence.  Discussed operating dangerous equipment and its relationship with hypersomnolence.  Discussed sleep hygiene, and benefits of a fixed sleep waked time.  The importance of getting eight or more hours of sleep discussed with patient.  Discussed limiting the use of the computer and television before bedtime.  Decrease naps during the day, so night time sleep will become enhanced.  Limit caffeine, and sleep deprivation.  HTN, stroke, and heart failure are potential risk factors.     Plan: Split-night sleep study to recheck appropriate pressures and cessation of sleep apnea along  with restaging of her obstructive sleep apnea.     Dyspnea Differential diagnosis includes: Sequela of pneumonia, relation to autoimmune disease, radiation sequela, chemotherapy sequela, cardiac,  obstructive lung disease, restrictive lung disease, deconditioning  Patient with complex medical history making a single etiology for her dyspnea very difficult. However, dyspnea seems to be worse since her recent multilobar pneumonia in December of 2015. Dyspnea is most likely multifactorial in nature as sequelae of pneumonia, deconditioning, cardiac stress given a BNP of 3300 and elevated troponins, possible sequelae of radiation therapy, and lastly in effective treatment of potential for sleep apnea.  Plan -pulmonary function testing and 6 minute walk test-given her history of prolonged smoking will further evaluate for COPD -2 chest x-ray in one month -Followup with cardiology given her elevated BNP, troponins, and dyspnea -Patient is following up with rheumatology at [facility] for her autoimmune disease -split-night study for OSA -weight loss and exercise will discuss with the patient also     Updated Medication List Outpatient Encounter Prescriptions as of 10/19/2014  Medication Sig  . albuterol (PROVENTIL HFA;VENTOLIN HFA) 108 (90 BASE) MCG/ACT inhaler Inhale 2 puffs into the lungs every 6 (six) hours as needed for wheezing or shortness of breath.  . ALPRAZolam (XANAX) 0.5 MG tablet Take 1 tablet (0.5 mg total) by mouth 4 (four) times daily as needed for anxiety.  Marland Kitchen BAYER CONTOUR NEXT TEST test strip CHECK BLOOD SUGAR SIX TIMES PER DAY AS DIRECTED  . cetirizine (ZYRTEC) 10 MG tablet Take 10 mg by mouth 2 (two) times daily.  . clobetasol ointment (TEMOVATE) 0.93 % Apply 1 application topically 2 (two) times daily.  . Emollient (CERAVE) CREA Apply topically.  Marland Kitchen glipiZIDE (GLUCOTROL) 5 MG tablet Take 2 tablets (10 mg total) by mouth 2 (two) times daily before a meal.  . hydrOXYzine (ATARAX/VISTARIL) 25 MG tablet Take 25 mg by mouth daily.  . hyoscyamine (LEVSIN, ANASPAZ) 0.125 MG tablet Take 1 tablet (0.125 mg total) by mouth every 4 (four) hours as needed.  . insulin detemir (LEVEMIR) 100  UNIT/ML injection Inject 0.12 mLs (12 Units total) into the skin at bedtime.  . Insulin Pen Needle (BD PEN NEEDLE NANO U/F) 32G X 4 MM MISC Use for insulin administration once daily  . Lancets (FREESTYLE) lancets USE TWICE A DAY  . LORAZEPAM PO Take by mouth. Take 2nd for nausea  . oxyCODONE (OXY IR/ROXICODONE) 5 MG immediate release tablet Take 1 tablet (5 mg total) by mouth every 6 (six) hours as needed for severe pain.  . pantoprazole (PROTONIX) 40 MG tablet Take 1 tablet (40 mg total) by mouth 2 (two) times daily.  Marland Kitchen PARoxetine (PAXIL) 10 MG tablet Take 1 tablet (10 mg total) by mouth daily.  . prochlorperazine (COMPAZINE) 10 MG tablet Take 10 mg by mouth every 6 (six) hours as needed.  . ranitidine (ZANTAC) 300 MG tablet Take 1 tablet (300 mg total) by mouth daily.  . tamoxifen (NOLVADEX) 20 MG tablet Take 20 mg by mouth daily.    Orders for this visit: Orders Placed This Encounter  Procedures  . DG Chest 2 View    Standing Status: Future     Number of Occurrences:      Standing Expiration Date: 12/18/2015    Scheduling Instructions:     Please fax result to [phone] Dr. Stevenson Clinch Attn.    Order Specific Question:  Reason for Exam (SYMPTOM  OR DIAGNOSIS REQUIRED)    Answer:  sob, cough    Order Specific Question:  Is the patient pregnant?    Answer:  No    Order Specific Question:  Preferred imaging location?    Answer:  External     Comments:  [facility]  . Pulmonary function test    Standing Status: Future     Number of Occurrences:      Standing Expiration Date: 10/20/2015    Order Specific Question:  Where should this test be performed?    Answer:  [facility]    Order Specific Question:  Full PFT: includes the following: basic spirometry, spirometry pre & post bronchodilator, diffusion capacity (DLCO), lung volumes    Answer:  Full PFT    Order Specific Question:  MIP/MEP    Answer:  No    Order Specific Question:  6 minute walk    Answer:  Yes    Order Specific  Question:  ABG    Answer:  No    Order Specific Question:  Diffusion capacity (DLCO)    Answer:  No    Order Specific Question:  Lung volumes    Answer:  No    Order Specific Question:  Methacholine challenge    Answer:  No  . Split night study    Standing Status: Future     Number of Occurrences:      Standing Expiration Date: 10/20/2015    Scheduling Instructions:     Please set patient up for a split night in [place]. Dx OSA    Order Specific Question:  Where should this test be performed:    Answer:  [facility]     Thank  you for the consultation and for allowing [facility] to assist in the care of your patient. Our recommendations are noted above.  Please contact us if we can be of further service.   Vilinda Boehringer, MD [facility] Office Number: [phone]

## 2014-10-19 NOTE — Patient Instructions (Addendum)
We will schedule you for a Pulmonary Function test and a 6 Min walk test. Please schedule a follow-up within 1 month with Dr. Fletcher Anon for shortness of breath. We will give order for chest xray to be done at [facility]. We will schedule split night sleep study.

## 2014-10-19 NOTE — Assessment & Plan Note (Signed)
Patient with known history of sleep apnea, no followup in the past 7 years. Wears a CPAP at home with 2.5 L O2 bleed in. In the last 7 years has had multiple weight fluctuations and states has a lot of air from mask coming out at night. Wear his CPAP nightly.   OSA  Encouraged proper weight management.  Excessive weight may contribute to snoring.  Monitor sedative use.  Discussed driving precautions and its relationship with hypersomnolence.  Discussed operating dangerous equipment and its relationship with hypersomnolence.  Discussed sleep hygiene, and benefits of a fixed sleep waked time.  The importance of getting eight or more hours of sleep discussed with patient.  Discussed limiting the use of the computer and television before bedtime.  Decrease naps during the day, so night time sleep will become enhanced.  Limit caffeine, and sleep deprivation.  HTN, stroke, and heart failure are potential risk factors.     Plan: Split-night sleep study to recheck appropriate pressures and cessation of sleep apnea along with restaging of her obstructive sleep apnea.

## 2014-10-19 NOTE — Assessment & Plan Note (Signed)
Differential diagnosis includes: Sequela of pneumonia, relation to autoimmune disease, radiation sequela, chemotherapy sequela, cardiac, obstructive lung disease, restrictive lung disease, deconditioning  Patient with complex medical history making a single etiology for her dyspnea very difficult. However, dyspnea seems to be worse since her recent multilobar pneumonia in December of 2015. Dyspnea is most likely multifactorial in nature as sequelae of pneumonia, deconditioning, cardiac stress given a BNP of 3300 and elevated troponins, possible sequelae of radiation therapy, and lastly in effective treatment of potential for sleep apnea.  Plan -pulmonary function testing and 6 minute walk test-given her history of prolonged smoking will further evaluate for COPD -2 chest x-ray in one month -Followup with cardiology given her elevated BNP, troponins, and dyspnea -Patient is following up with rheumatology at [facility] for her autoimmune disease -split-night study for OSA -weight loss and exercise will discuss with the patient also

## 2014-10-20 ENCOUNTER — Encounter: Payer: Self-pay | Admitting: Internal Medicine

## 2014-10-20 ENCOUNTER — Telehealth: Payer: Self-pay | Admitting: Internal Medicine

## 2014-10-20 ENCOUNTER — Ambulatory Visit: Payer: Self-pay | Admitting: Internal Medicine

## 2014-10-20 NOTE — Telephone Encounter (Signed)
Pt is requesting a referral to a cardiologists at [facility]--Dr. Naida Sleight. Please advise Dr. Stevenson Clinch. thanks

## 2014-10-22 NOTE — Telephone Encounter (Signed)
Patient has been following with Dr. Marlyne Beards at [facility] in [place], if she is requesting a different cardiologist, then her PMD should make that referral.   Thank you.

## 2014-10-24 ENCOUNTER — Telehealth: Payer: Self-pay | Admitting: Internal Medicine

## 2014-10-24 ENCOUNTER — Encounter: Payer: Self-pay | Admitting: Internal Medicine

## 2014-10-24 DIAGNOSIS — R0600 Dyspnea, unspecified: Secondary | ICD-10-CM

## 2014-10-24 NOTE — Telephone Encounter (Signed)
Pt returning call.Kristina Mccoy ° °

## 2014-10-24 NOTE — Telephone Encounter (Signed)
Advised pt

## 2014-10-24 NOTE — Telephone Encounter (Signed)
Bone density testing from 1/21 was normal.

## 2014-10-24 NOTE — Telephone Encounter (Signed)
Pt aware of VM's recs.  Nothing further needed.

## 2014-10-24 NOTE — Telephone Encounter (Signed)
lmtcb X1 to make pt aware of VM's recs.

## 2014-11-01 ENCOUNTER — Encounter: Payer: Self-pay | Admitting: Internal Medicine

## 2014-11-07 ENCOUNTER — Encounter: Payer: Self-pay | Admitting: Internal Medicine

## 2014-11-07 ENCOUNTER — Ambulatory Visit (INDEPENDENT_AMBULATORY_CARE_PROVIDER_SITE_OTHER)
Admission: RE | Admit: 2014-11-07 | Discharge: 2014-11-07 | Disposition: A | Discharge status: Home / Self Care | Payer: BLUE CROSS/BLUE SHIELD | Source: Ambulatory Visit | Attending: Internal Medicine | Admitting: Internal Medicine

## 2014-11-07 ENCOUNTER — Ambulatory Visit (INDEPENDENT_AMBULATORY_CARE_PROVIDER_SITE_OTHER): Payer: BLUE CROSS/BLUE SHIELD | Admitting: Internal Medicine

## 2014-11-07 VITALS — BP 122/74 | HR 84 | Temp 97.7°F | Ht 64.5 in | Wt 183.5 lb

## 2014-11-07 DIAGNOSIS — G8929 Other chronic pain: Secondary | ICD-10-CM | POA: Insufficient documentation

## 2014-11-07 DIAGNOSIS — M545 Low back pain, unspecified: Secondary | ICD-10-CM

## 2014-11-07 DIAGNOSIS — M25511 Pain in right shoulder: Secondary | ICD-10-CM

## 2014-11-07 DIAGNOSIS — F419 Anxiety disorder, unspecified: Secondary | ICD-10-CM

## 2014-11-07 DIAGNOSIS — R0600 Dyspnea, unspecified: Secondary | ICD-10-CM

## 2014-11-07 DIAGNOSIS — G894 Chronic pain syndrome: Secondary | ICD-10-CM

## 2014-11-07 MED ORDER — HYOSCYAMINE SULFATE 0.125 MG PO TABS: 0.1250 mg | ORAL_TABLET | ORAL | Status: DC | PRN

## 2014-11-07 MED ORDER — ALPRAZOLAM 0.5 MG PO TABS: 0.5000 mg | ORAL_TABLET | Freq: Four times a day (QID) | ORAL | Status: DC | PRN

## 2014-11-07 MED ORDER — OXYCODONE HCL 5 MG PO TABS: 5.0000 mg | ORAL_TABLET | Freq: Four times a day (QID) | ORAL | Status: DC | PRN

## 2014-11-07 NOTE — Assessment & Plan Note (Signed)
Symptoms generally controlled with paroxetine and prn alprazolam. Will continue.

## 2014-11-07 NOTE — Progress Notes (Signed)
Pre visit review using our clinic review tool, if applicable. No additional management support is needed unless otherwise documented below in the visit note. 

## 2014-11-07 NOTE — Patient Instructions (Signed)
Xrays today.  Follow up 4 weeks.

## 2014-11-07 NOTE — Assessment & Plan Note (Signed)
Chronic dyspnea, likely multifactorial. Sleep study, PFTs pending. Cardiology second opinion pending. Suspect that anxiety is also playing a role in symptoms. Encouraged her to start gentle exercise such as yoga.

## 2014-11-07 NOTE — Assessment & Plan Note (Signed)
Chronic diffuse arthralgia, which existed prior to chemotherapy, but has been much worse after cancer treatment. Rheumatology evaluation pending. Labs remarkable for pos ANA and SSA. Suspect that autoimmune disease playing a role in symptoms. Will continue prn Oxycodone for severe pain only. Will need referral to chronic pain management if symptoms persist to require narcotic pain meds. She canceled last evaluation with pain management. Will see if we can get her set up a [facility].

## 2014-11-07 NOTE — Assessment & Plan Note (Signed)
Exam normal. Will get plain xray given severity of pain, waking her at night. Continue prn Oxycodone.

## 2014-11-07 NOTE — Assessment & Plan Note (Signed)
Exam normal. Given severity of pain will get plain xray. Encouraged gentle stretching exercises such as Yoga. Discussed starting PT if xray normal.

## 2014-11-07 NOTE — Progress Notes (Signed)
Subjective:    Patient ID: Kristina Mccoy, female    DOB: 1955-10-30, 59 y.o.   MRN: [id number]  HPI  59YO female presents for follow up.  Dyspnea - Seen by Dr. Stevenson Clinch. Sleep study scheduled. PFTs scheduled in March. Continues to have shortness of breath and right chest pain at rest. No chest pain or palpitations. No change from previous.  Evaluation with Dr. Naida Sleight in cardiology pending. Pt requested second opinion.  Right shoulder pain has been persistent last few weeks. Radiates down right arm. Worsened by movement. Taking Oxycodone for pain with some improvement. No trauma to right shoulder. Also notes aching pain in lower back over last few months. Pain does not radiate. No weakness. No numbness noted.  Past medical, surgical, family and social history per today's encounter.  Review of Systems  Constitutional: Negative for fever, chills, appetite change, fatigue and unexpected weight change.  Eyes: Negative for visual disturbance.  Respiratory: Positive for shortness of breath.   Cardiovascular: Negative for chest pain and leg swelling.  Gastrointestinal: Negative for abdominal pain, diarrhea and constipation.  Musculoskeletal: Positive for myalgias, back pain, arthralgias and neck pain.  Skin: Negative for color change and rash.  Hematological: Negative for adenopathy. Does not bruise/bleed easily.  Psychiatric/Behavioral: Positive for sleep disturbance and dysphoric mood. The patient is not nervous/anxious.        Objective:    BP 122/74 mmHg  Pulse 84  Temp(Src) 97.7 F (36.5 C) (Oral)  Ht 5' 4.5" (1.638 m)  Wt 183 lb 8 oz (83.235 kg)  BMI 31.02 kg/m2  SpO2 97% Physical Exam  Constitutional: She is oriented to person, place, and time. She appears well-developed and well-nourished. No distress.  HENT:  Head: Normocephalic and atraumatic.  Right Ear: External ear normal.  Left Ear: External ear normal.  Nose: Nose normal.  Mouth/Throat: Oropharynx is clear  and moist. No oropharyngeal exudate.  Eyes: Conjunctivae are normal. Pupils are equal, round, and reactive to light. Right eye exhibits no discharge. Left eye exhibits no discharge. No scleral icterus.  Neck: Normal range of motion. Neck supple. No tracheal deviation present. No thyromegaly present.  Cardiovascular: Normal rate, regular rhythm, normal heart sounds and intact distal pulses.  Exam reveals no gallop and no friction rub.   No murmur heard. Pulmonary/Chest: Effort normal and breath sounds normal. No respiratory distress. She has no wheezes. She has no rales. She exhibits no tenderness.  Musculoskeletal: Normal range of motion. She exhibits no edema.       Right shoulder: She exhibits pain. She exhibits normal range of motion and no tenderness.       Lumbar back: She exhibits tenderness and pain. She exhibits normal range of motion.  Lymphadenopathy:    She has no cervical adenopathy.  Neurological: She is alert and oriented to person, place, and time. No cranial nerve deficit. She exhibits normal muscle tone. Coordination normal.  Skin: Skin is warm and dry. No rash noted. She is not diaphoretic. No erythema. No pallor.  Psychiatric: She has a normal mood and affect. Her behavior is normal. Judgment and thought content normal.          Assessment & Plan:   Problem List Items Addressed This Visit      Unprioritized   Anxiety    Symptoms generally controlled with paroxetine and prn alprazolam. Will continue.      Relevant Medications   ALPRAZolam Duanne Moron) tablet   Chronic pain disorder    Chronic diffuse arthralgia,  which existed prior to chemotherapy, but has been much worse after cancer treatment. Rheumatology evaluation pending. Labs remarkable for pos ANA and SSA. Suspect that autoimmune disease playing a role in symptoms. Will continue prn Oxycodone for severe pain only. Will need referral to chronic pain management if symptoms persist to require narcotic pain meds. She  canceled last evaluation with pain management. Will see if we can get her set up a [facility].        Dyspnea - Primary    Chronic dyspnea, likely multifactorial. Sleep study, PFTs pending. Cardiology second opinion pending. Suspect that anxiety is also playing a role in symptoms. Encouraged her to start gentle exercise such as yoga.      Midline low back pain without sciatica    Exam normal. Given severity of pain will get plain xray. Encouraged gentle stretching exercises such as Yoga. Discussed starting PT if xray normal.      Relevant Medications   oxyCODONE (Oxy IR/ROXICODONE) immediate release tablet   Other Relevant Orders   DG Lumbar Spine Complete   Right shoulder pain    Exam normal. Will get plain xray given severity of pain, waking her at night. Continue prn Oxycodone.      Relevant Orders   DG Shoulder Right       Return in about 4 weeks (around 12/05/2014) for Recheck.

## 2014-11-08 ENCOUNTER — Ambulatory Visit: Payer: Self-pay | Admitting: Internal Medicine

## 2014-11-09 ENCOUNTER — Telehealth: Payer: Self-pay | Admitting: Internal Medicine

## 2014-11-09 NOTE — Telephone Encounter (Signed)
Will forward to [facility] to look out for sleep study results.

## 2014-11-09 NOTE — Telephone Encounter (Signed)
I do not have the sleep results. Typically after the sleep study, it takes about 3 days before it is officially read, and then it takes about another 3-4 days before it gets to me.

## 2014-11-09 NOTE — Telephone Encounter (Signed)
Spoke with pt, is requesting last night's sleep study.  I advised that it can take up to two weeks to receive sleep study results but that we would contact her with these as soon as we have them.    Dr. Stevenson Clinch do you have these results yet?  Thanks!

## 2014-11-14 ENCOUNTER — Telehealth: Payer: Self-pay | Admitting: *Deleted

## 2014-11-14 NOTE — Telephone Encounter (Signed)
Informed patient results of sleep study have not been received yet.

## 2014-11-14 NOTE — Telephone Encounter (Signed)
Called patient to let her know we do not have results of sleep study yet. Patient says she was told that Dr. Stevenson Clinch wanted her to have another echocardiogram. I do not see in her note where that was discussed. She is to have a cxr this month. Patient would like to know why she is to follow up with cardiology. She is also transferring to [facility] for further care.

## 2014-11-15 ENCOUNTER — Encounter: Payer: Self-pay | Admitting: Endocrinology

## 2014-11-15 ENCOUNTER — Ambulatory Visit (INDEPENDENT_AMBULATORY_CARE_PROVIDER_SITE_OTHER): Payer: BLUE CROSS/BLUE SHIELD | Admitting: Endocrinology

## 2014-11-15 VITALS — BP 116/70 | HR 76 | Resp 16 | Ht 64.5 in | Wt 185.8 lb

## 2014-11-15 DIAGNOSIS — IMO0002 Reserved for concepts with insufficient information to code with codable children: Secondary | ICD-10-CM

## 2014-11-15 DIAGNOSIS — E1165 Type 2 diabetes mellitus with hyperglycemia: Secondary | ICD-10-CM

## 2014-11-15 DIAGNOSIS — E785 Hyperlipidemia, unspecified: Secondary | ICD-10-CM

## 2014-11-15 NOTE — Patient Instructions (Signed)
Cut back Glipizide to 5 mg in the morning and 10 mg in the evening.  Continue lantus at 12 units daily.  Start checking at various times of the day- if you start seeing higher numbers ~160/170, then let me know so that we can increase lantus to 14 units daily.   Please come back for a follow-up appointment in 1 month.

## 2014-11-15 NOTE — Progress Notes (Signed)
Reason for visit-  Kristina Mccoy is a 60 y.o.-year-old female, here for follow up  of Type 2 diabetes, uncontrolled, without complications. Last seen Dec 2015.    HPI- Diagnosed in 2000. Recalls being initially on lifestyle modifications.   Prior treatment hx-   - Metformin- didn't tolerate due to GI symptoms - Januvia was taken off due to "scare of side effects".  - well controlled till 2014, till she was dxed with breast cancer. Then during chemotherapy was on levemir and novolog around December 2014.  - Didn't tolerate levemir, and stopped August 2015- due to feeling bloated with medication and skin itching.  - ? Rash/Hives with prior Novolog use.  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 By meter review:  PREMEAL Breakfast Lunch Dinner Bedtime Overall  Glucose range: 122-192  108-178    Mean/median:        Hypoglycemia/Hyperglycemia-  2 recent lows in the 70s mid day- hence has been hesistant to increase her lantus.  she has hypoglycemia awareness at 74.    Dietary habits- eats three times daily. Tries to limit carbs, sweetened beverages, sodas, desserts. Not eating sweets and junk foods. Exercise- used to walk for exercise mainly. Had started more intense exercise, but had to stop it recently due to joint pain. Also, SOB/recent hospitalization limits her exercise ability.  Weight -   Wt Readings from Last 3  Encounters:  11/15/14 185 lb 12 oz (84.256 kg)  11/07/14 183 lb 8 oz (83.235 kg)  10/19/14 185 lb (83.915 kg)    Diabetes Complications- No known complications Nephropathy-- No  CKD, last BUN/creatinine- Lab Results  Component Value Date   BUN 12 10/04/2014   CREATININE 0.8 10/04/2014    Lab Results  Component Value Date   GFR 74.35 10/04/2014   Lab Results  Component Value Date   MICRALBCREAT 1.4 05/04/2014    Retinopathy- No, Last DEE- Nov 2015. Neuropathy-  Reports stable numbness and tingling in her toes and hands. No known neuropathy.  Associated history - No history of CAD or prior stroke. Has PVD. No hypothyroidism. her last TSH was  Lab Results  Component Value Date   TSH 0.63 05/04/2014     Blood Pressure- Patient's blood pressure is well controlled today.  Hyperlipidemia- her last set of lipids were- Lab Results  Component Value Date   CHOL 210* 05/04/2014   HDL 44.90 05/04/2014   LDLCALC 115* 01/24/2014   LDLDIRECT 155.3 05/04/2014   TRIG 239.0* 05/04/2014   CHOLHDL 5 05/04/2014   I have reviewed the patient's past medical history,  medications and allergies.   Current Outpatient Prescriptions on File Prior to Visit  Medication Sig Dispense Refill  . albuterol (PROVENTIL HFA;VENTOLIN HFA) 108 (90 BASE) MCG/ACT inhaler Inhale 2 puffs into the lungs every 6 (six) hours as needed for wheezing or shortness of breath. 1 Inhaler 2  .  ALPRAZolam (XANAX) 0.5 MG tablet Take 1 tablet (0.5 mg total) by mouth 4 (four) times daily as needed for anxiety. 120 tablet 1  . BAYER CONTOUR NEXT TEST test strip CHECK BLOOD SUGAR SIX TIMES PER DAY AS DIRECTED 100 each 5  . cetirizine (ZYRTEC) 10 MG tablet Take 10 mg by mouth 2 (two) times daily.    . clobetasol ointment (TEMOVATE) 8.18 % Apply 1 application topically 2 (two) times daily.    . Emollient (CERAVE) CREA Apply topically.    Marland Kitchen glipiZIDE (GLUCOTROL) 5 MG tablet Take 2 tablets (10 mg total) by mouth 2 (two) times  daily before a meal. 120 tablet 3  . hydrOXYzine (ATARAX/VISTARIL) 25 MG tablet Take 25 mg by mouth daily.    . hyoscyamine (LEVSIN, ANASPAZ) 0.125 MG tablet Take 1 tablet (0.125 mg total) by mouth every 4 (four) hours as needed. 30 tablet 6  . insulin glargine (LANTUS) 100 UNIT/ML injection Inject 12 Units into the skin at bedtime.    . Insulin Pen Needle (BD PEN NEEDLE NANO U/F) 32G X 4 MM MISC Use for insulin administration once daily 30 each 11  . Lancets (FREESTYLE) lancets USE TWICE A DAY 100 each 5  . LORAZEPAM PO Take by mouth. Take 2nd for nausea    . oxyCODONE (OXY IR/ROXICODONE) 5 MG immediate release tablet Take 1 tablet (5 mg total) by mouth every 6 (six) hours as needed for severe pain. 120 tablet 0  . pantoprazole (PROTONIX) 40 MG tablet Take 1 tablet (40 mg total) by mouth 2 (two) times daily. 60 tablet 3  . PARoxetine (PAXIL) 10 MG tablet Take 1 tablet (10 mg total) by mouth daily. 30 tablet 0  . prochlorperazine (COMPAZINE) 10 MG tablet Take 10 mg by mouth every 6 (six) hours as needed.    . ranitidine (ZANTAC) 300 MG tablet Take 1 tablet (300 mg total) by mouth daily. 90 tablet 3  . tamoxifen (NOLVADEX) 20 MG tablet Take 20 mg by mouth daily.     No current facility-administered medications on file prior to visit.   Allergies  Allergen Reactions  . Gabapentin Itching and Swelling  . Buprenorphine Hcl   . Methylprednisolone     Reddened face, puffy face   . Morphine And Related   . Pregabalin Swelling    Swelling in arms and legs    Review of Systems- [ x ]  Complains of    [  ]  denies [  ] Recent weight change [ x ]  Fatigue [  ] polydipsia [  ] polyuria [  ]  nocturia [  ]  vision difficulty [  ] chest pain [ x ] shortness of breath-improving after recent Hospital admission [ x ] leg swelling- pulm following [  ] cough [  ] nausea/vomiting [  ] diarrhea [  ] constipation [  ] abdominal pain [ x ]  tingling/numbness in extremities [  ]  concern with feet (  wounds/sores)    PE: BP 116/70 mmHg  Pulse 76  Resp 16  Ht 5' 4.5" (1.638 m)  Wt 185 lb 12 oz (84.256 kg)  BMI 31.40 kg/m2  SpO2 97% Wt Readings from Last 3 Encounters:  11/15/14 185 lb 12 oz (84.256 kg)  11/07/14 183 lb 8 oz (83.235 kg)  10/19/14 185 lb (83.915 kg)     ASSESSMENT: 1. Type 2 diabetes, uncontrolled 2. Hyperlipidemia  Problem List Items Addressed This Visit  Other   Hyperlipidemia    Recent lipids not controlled. Low fat diet and exercise.  She will be a candidate to start statin therapy based on her last lipids. However, she is dealing with rheumatological issues lately, recent discharge from hospital, and having tolerance issues with DM medications, so I would prefer for her to start statin therapy once she gets better with these factors.   Will reassess at next visit ( due for labs then).             Diabetes type 2, uncontrolled - Primary    Recent A1c not controlled. Update A1c at next visit.   Her sugars seem better with recent dietary changes and start of lantus.  Given her 2 episodes of lows, I have asked her to decrease the dose of Glipizide to 5mg  morning and 10 mg evening. Continue current lantus for now, but check sugars during the day and if they are ~160/170, then increase lantus to 14 units daily.  She was also given a freestyle lite meter and will notify us if that is covered so that testing supplies could be sent to her pharmacy.  She will try adding a walk during the daytime.     Check sugars 2 times daily.   RTC 1 months             - Return to clinic in 1 mo with sugar log/meter.  Bynum Bellows [facility] 11/15/2014 3:39 PM

## 2014-11-15 NOTE — Assessment & Plan Note (Addendum)
Recent A1c not controlled. Update A1c at next visit.   Her sugars seem better with recent dietary changes and start of lantus.  Given her 2 episodes of lows, I have asked her to decrease the dose of Glipizide to 5mg  morning and 10 mg evening. Continue current lantus for now, but check sugars during the day and if they are ~160/170, then increase lantus to 14 units daily.  She was also given a freestyle lite meter and will notify us if that is covered so that testing supplies could be sent to her pharmacy.  She will try adding a walk during the daytime.     Check sugars 2 times daily.   RTC 1 months

## 2014-11-15 NOTE — Telephone Encounter (Signed)
I explained to patient she needs to follow-up with cardiology. Pt is aware CT scan is to be ordered through her cardiologist.

## 2014-11-15 NOTE — Progress Notes (Signed)
Pre visit review using our clinic review tool, if applicable. No additional management support is needed unless otherwise documented below in the visit note. 

## 2014-11-15 NOTE — Telephone Encounter (Signed)
Here's copy of my last note:  Dyspnea Differential diagnosis includes: Sequela of pneumonia, relation to autoimmune disease, radiation sequela, chemotherapy sequela, cardiac, obstructive lung disease, restrictive lung disease, deconditioning  Patient with complex medical history making a single etiology for her dyspnea very difficult. However, dyspnea seems to be worse since her recent multilobar pneumonia in December of 2015. Dyspnea is most likely multifactorial in nature as sequelae of pneumonia, deconditioning, cardiac stress given a BNP of 3300 and elevated troponins, possible sequelae of radiation therapy, and lastly in effective treatment of potential for sleep apnea.  Plan -pulmonary function testing and 6 minute walk test-given her history of prolonged smoking will further evaluate for COPD -2 chest x-ray in one month -Followup with cardiology given her elevated BNP, troponins, and dyspnea -Patient is following up with rheumatology at [facility] for her autoimmune disease -split-night study for OSA -weight loss and exercise will discuss with the patient also

## 2014-11-15 NOTE — Assessment & Plan Note (Signed)
Recent lipids not controlled. Low fat diet and exercise.  She will be a candidate to start statin therapy based on her last lipids. However, she is dealing with rheumatological issues lately, recent discharge from hospital, and having tolerance issues with DM medications, so I would prefer for her to start statin therapy once she gets better with these factors.   Will reassess at next visit ( due for labs then).

## 2014-11-24 ENCOUNTER — Ambulatory Visit: Payer: Self-pay | Admitting: Internal Medicine

## 2014-11-29 ENCOUNTER — Ambulatory Visit: Payer: Self-pay | Admitting: Internal Medicine

## 2014-11-30 ENCOUNTER — Encounter: Payer: Self-pay | Admitting: Internal Medicine

## 2014-11-30 ENCOUNTER — Ambulatory Visit: Payer: Self-pay

## 2014-11-30 ENCOUNTER — Ambulatory Visit (INDEPENDENT_AMBULATORY_CARE_PROVIDER_SITE_OTHER): Payer: BLUE CROSS/BLUE SHIELD | Admitting: Internal Medicine

## 2014-11-30 VITALS — BP 134/78 | HR 68 | Ht 64.5 in | Wt 185.0 lb

## 2014-11-30 DIAGNOSIS — R0600 Dyspnea, unspecified: Secondary | ICD-10-CM

## 2014-11-30 DIAGNOSIS — G4733 Obstructive sleep apnea (adult) (pediatric): Secondary | ICD-10-CM

## 2014-11-30 DIAGNOSIS — Z9989 Dependence on other enabling machines and devices: Secondary | ICD-10-CM

## 2014-11-30 LAB — PULMONARY FUNCTION TEST
DL/VA % pred: 88 %
DL/VA: 4.28 ml/min/mmHg/L
DLCO unc % pred: 61 %
DLCO unc: 15.45 ml/min/mmHg
FEF 25-75 Post: 1.46 L/sec
FEF 25-75 Pre: 2.13 L/sec
FEF2575-%Change-Post: -31 %
FEF2575-%Pred-Post: 58 %
FEF2575-%Pred-Pre: 84 %
FEV1-%Change-Post: -9 %
FEV1-%Pred-Post: 65 %
FEV1-%Pred-Pre: 72 %
FEV1-Post: 1.75 L
FEV1-Pre: 1.95 L
FEV1FVC-%Change-Post: -7 %
FEV1FVC-%Pred-Pre: 105 %
FEV6-%Change-Post: -3 %
FEV6-%Pred-Post: 67 %
FEV6-%Pred-Pre: 69 %
FEV6-Post: 2.25 L
FEV6-Pre: 2.33 L
FEV6FVC-%Pred-Post: 103 %
FEV6FVC-%Pred-Pre: 103 %
FVC-%Change-Post: -2 %
FVC-%Pred-Post: 65 %
FVC-%Pred-Pre: 67 %
FVC-Post: 2.28 L
FVC-Pre: 2.33 L
Post FEV1/FVC ratio: 77 %
Post FEV6/FVC ratio: 100 %
Pre FEV1/FVC ratio: 84 %
Pre FEV6/FVC Ratio: 100 %
RV % pred: 74 %
RV: 1.46 L
TLC % pred: 76 %
TLC: 3.94 L

## 2014-11-30 NOTE — Progress Notes (Signed)
SMW performed today. 

## 2014-11-30 NOTE — Progress Notes (Signed)
PFT performed today. 

## 2014-11-30 NOTE — Patient Instructions (Addendum)
Follow up with Dr. Stevenson Clinch in 2 months - Autopap 5-15cm H20, please ask your DME to fax [place] a copy of the report after 2 week of autopap - continue with weight loss and exercise

## 2014-11-30 NOTE — Progress Notes (Signed)
MRN# [id number] Kristina Mccoy Oct 06, 1955   CC:" Follow-up of my tests for shortness of breath" Chief Complaint  Patient presents with  . Follow-up    SOB at all times;      Brief History: Synopsis: 6 female presenting to [facility] January 2016 for evaluation of dyspnea on exertion.  Patient has a complex medical history of breast cancer, Sjorgen disease, fibromyalgia, obstructive sleep apnea, obesity, diabetes and hyperlipidemia. Patient states she was recently admitted to the hospital in December 2015 since then she has not been able to get back to baseline breathing, she has dyspnea at rest, which is slowly getting better. PFTs and mild restriction, RV 74, DLCO 61. Sleep study February 2016 AHI 14.3, AutoPap 5-15.   Events since last clinic visit: Patient presents today for a follow up of dyspnea.  At her last visit she was scheduled for a split night study, pfts, 73mwt, 2 view CXR and follow up with Cardiology. Today she states that her dyspnea is unchanged, still struggling at times to take a "deep breath." Patient states that performing 1 chore will make her very dyspneic.  She has followed up with Cardiology at [facility], they have scheduled for nuclear stress test and ECHO.  Today patient endorses chronic fatigue and chronic dyspnea. States that she goes to bed around 10.30, will watch TV at times when trying to fall asleep, no caffeine after 5pm.   PMHX:   Past Medical History  Diagnosis Date  . Otitis media, chronic   . Diabetes mellitus   . Fibromyalgia   . Breast cancer 5/14    left breast  . Lymphedema of arm     right  . Hyperlipidemia   . Heart murmur    Surgical Hx:  Past Surgical History  Procedure Laterality Date  . Mastectomy Bilateral 03/23/13  . Breast surgery     Family Hx:  Family History  Problem Relation Age of Onset  . Diabetes Mother   . Heart disease Mother   . Hyperlipidemia Mother   . Heart failure Mother   . Heart  disease Brother   . Hyperlipidemia Brother   . Diabetes Other    Social Hx:   History  Substance Use Topics  . Smoking status: Former Smoker -- 0.50 packs/day    Types: Cigarettes    Quit date: 02/28/2013  . Smokeless tobacco: Never Used  . Alcohol Use: No   Medication:   Current Outpatient Rx  Name  Route  Sig  Dispense  Refill  . ALPRAZolam (XANAX) 0.5 MG tablet   Oral   Take 1 tablet (0.5 mg total) by mouth 4 (four) times daily as needed for anxiety.   120 tablet   1   . BAYER CONTOUR NEXT TEST test strip      CHECK BLOOD SUGAR SIX TIMES PER DAY AS DIRECTED   100 each   5   . cetirizine (ZYRTEC) 10 MG tablet   Oral   Take 10 mg by mouth 2 (two) times daily.         . clobetasol ointment (TEMOVATE) 0.05 %   Topical   Apply 1 application topically 2 (two) times daily.         . Emollient (CERAVE) CREA   Apply externally   Apply topically.         Marland Kitchen glipiZIDE (GLUCOTROL) 5 MG tablet   Oral   Take 2 tablets (10 mg total) by mouth 2 (two) times daily before  a meal.   120 tablet   3   . hyoscyamine (LEVSIN, ANASPAZ) 0.125 MG tablet   Oral   Take 1 tablet (0.125 mg total) by mouth every 4 (four) hours as needed.   30 tablet   6   . insulin glargine (LANTUS) 100 UNIT/ML injection   Subcutaneous   Inject 12 Units into the skin at bedtime.         . Insulin Pen Needle (BD PEN NEEDLE NANO U/F) 32G X 4 MM MISC      Use for insulin administration once daily   30 each   11   . Lancets (FREESTYLE) lancets      USE TWICE A DAY   100 each   5   . oxyCODONE (OXY IR/ROXICODONE) 5 MG immediate release tablet   Oral   Take 1 tablet (5 mg total) by mouth every 6 (six) hours as needed for severe pain.   120 tablet   0   . pantoprazole (PROTONIX) 40 MG tablet   Oral   Take 1 tablet (40 mg total) by mouth 2 (two) times daily.   60 tablet   3   . PARoxetine (PAXIL) 10 MG tablet   Oral   Take 1 tablet (10 mg total) by mouth daily.   30 tablet   0    . prochlorperazine (COMPAZINE) 10 MG tablet   Oral   Take 10 mg by mouth every 6 (six) hours as needed.         . ranitidine (ZANTAC) 300 MG tablet   Oral   Take 1 tablet (300 mg total) by mouth daily.   90 tablet   3   . tamoxifen (NOLVADEX) 20 MG tablet   Oral   Take 20 mg by mouth daily.         Marland Kitchen albuterol (PROVENTIL HFA;VENTOLIN HFA) 108 (90 BASE) MCG/ACT inhaler   Inhalation   Inhale 2 puffs into the lungs every 6 (six) hours as needed for wheezing or shortness of breath. Patient not taking: Reported on 11/30/2014   1 Inhaler   2      Review of Systems: Gen:  General fatigue HEENT: Denies blurred vision, double vision, ear pain, eye pain, hearing loss, nose bleeds, sore throat Cvc:  No dizziness, chest pain or heaviness Resp:   Shortness of breath with exertion Gi: Denies swallowing difficulty, stomach pain, nausea or vomiting, diarrhea, constipation, bowel incontinence Gu:  Denies bladder incontinence, burning urine Ext:   No Joint pain, stiffness or swelling Skin: No skin rash, easy bruising or bleeding or hives Endoc:  No polyuria, polydipsia , polyphagia or weight change Psych: No depression, insomnia or hallucinations  Other:  All other systems negative  Allergies:  Gabapentin; Buprenorphine hcl; Methylprednisolone; Morphine and related; and Pregabalin  Physical Examination:  VS: BP 134/78 mmHg  Pulse 68  Ht 5' 4.5" (1.638 m)  Wt 185 lb (83.915 kg)  BMI 31.28 kg/m2  SpO2 99%  General Appearance: No distress  HEENT: PERRLA, EOM intact, no ptosis, no other lesions noticed Pulmonary:Exam: normal breath sounds., diaphragmatic excursion normal.No wheezing, No rales   Cardiovascular:@ Exam:  Normal S1,S2.  No m/r/g.     Abdomen:Exam: Benign, Soft, non-tender, No masses  Skin:   warm, no rashes, no ecchymosis  Extremities: normal, no cyanosis, clubbing, no edema, warm with normal capillary refill.   Labs results:  BMP Lab Results  Component Value Date    NA 135 10/04/2014   K 3.8  10/04/2014   CL 99 10/04/2014   CO2 29 10/04/2014   GLUCOSE 142* 10/04/2014   BUN 12 10/04/2014   CREATININE 0.8 10/04/2014     CBC CBC Latest Ref Rng 10/04/2014 09/08/2014 08/16/2014  WBC 4.0 - 10.5 K/uL 8.0 11.6(H) 7.7  Hemoglobin 12.0 - 15.0 g/dL 14.5 13.8 14.3  Hematocrit 36.0 - 46.0 % 44.2 41.6 42.3  Platelets 150.0 - 400.0 K/uL 229.0 319.0 204.0     Rad results: (The following images and results were reviewed by Dr. Stevenson Clinch). CXR 2 view 2/016 INDINGS: The previously noted patchy opacity in the left base has cleared. There is currently scarring in the right apex. There is no edema or consolidation. Heart size and pulmonary vascularity are normal. No adenopathy. There are surgical clips in the right axillary region. There is degenerative change in the thoracic spine.  IMPRESSION: Currently no edema or consolidation appreciable. Mild scarring right apex. No change in cardiac silhouette.    Other: 11/30/2014 6 minute walk test-312 m/1031ft, lowest sat 99%, highest heart rate 74 Pulmonary function test: FEV1 72%, FEV1/FVC 84, RV 74, TLC 76, RV/TLC 97% ERV 24% Sleep study 11/18/2014: AHI 14.3, supine AHI 55.1  Assessment and Plan: 59 year old female with dyspnea on exertion. OSA on CPAP Patient with known history of sleep apnea, no followup in the past 7 years. Current states that he would AHI 14.3, supine AHI 55.1, The results were discussed with the patient, untreated subcutaneous sleep apnea could be adding to her generalized fatigue and overall dyspnea on exertion. Patient is adamant about not having a split-night study, we have agreed to give a AutoPap trial 5-15.   OSA  Encouraged proper weight management.  Excessive weight may contribute to snoring.  Monitor sedative use.  Discussed driving precautions and its relationship with hypersomnolence.  Discussed operating dangerous equipment and its relationship with hypersomnolence.   Discussed sleep hygiene, and benefits of a fixed sleep waked time.  The importance of getting eight or more hours of sleep discussed with patient.  Discussed limiting the use of the computer and television before bedtime.  Decrease naps during the day, so night time sleep will become enhanced.  Limit caffeine, and sleep deprivation.  HTN, stroke, and heart failure are potential risk factors.     Plan: AHI 14.3, supine AHI 55, AutoPap-5-15 cm of water       Dyspnea Differential diagnosis includes: Sequela of pneumonia, relation to autoimmune disease, radiation sequela, chemotherapy sequela, cardiac, obstructive lung disease, restrictive lung disease, deconditioning  Patient with complex medical history making a single etiology for her dyspnea very difficult. However, dyspnea seems to be worse since her recent multilobar pneumonia in December of 2015. Dyspnea is most likely multifactorial in nature as sequelae of pneumonia, deconditioning, cardiac stress given a BNP of 3300 and elevated troponins, possible sequelae of radiation therapy, and lastly in effective treatment for sleep apnea.  Patient has follow up with [facility] cardiology she will have a nuclear stress test and an echocardiogram performed.  Pulmonary function testing with no significant obstruction, she does have mild restriction with decreased ERV which could be due to body habitus.  Plan - Patient consult on diet and exercise as tolerated. -Sleep study with mild obstructive sleep apnea, started on CPAP (AutoPap 5-15) -Continue follow-up with [place] cardiology for further evaluation of any underlying cardiomyopathy or valvular disorder that may have developed secondary to breast cancer therapy treatment with chemotherapy and radiation in the past.     Updated Medication List Outpatient  Encounter Prescriptions as of 11/30/2014  Medication Sig  . ALPRAZolam (XANAX) 0.5 MG tablet Take 1 tablet (0.5 mg total) by mouth 4 (four) times  daily as needed for anxiety.  Marland Kitchen BAYER CONTOUR NEXT TEST test strip CHECK BLOOD SUGAR SIX TIMES PER DAY AS DIRECTED  . cetirizine (ZYRTEC) 10 MG tablet Take 10 mg by mouth 2 (two) times daily.  . clobetasol ointment (TEMOVATE) 8.41 % Apply 1 application topically 2 (two) times daily.  . Emollient (CERAVE) CREA Apply topically.  Marland Kitchen glipiZIDE (GLUCOTROL) 5 MG tablet Take 2 tablets (10 mg total) by mouth 2 (two) times daily before a meal.  . hyoscyamine (LEVSIN, ANASPAZ) 0.125 MG tablet Take 1 tablet (0.125 mg total) by mouth every 4 (four) hours as needed.  . insulin glargine (LANTUS) 100 UNIT/ML injection Inject 12 Units into the skin at bedtime.  . Insulin Pen Needle (BD PEN NEEDLE NANO U/F) 32G X 4 MM MISC Use for insulin administration once daily  . Lancets (FREESTYLE) lancets USE TWICE A DAY  . oxyCODONE (OXY IR/ROXICODONE) 5 MG immediate release tablet Take 1 tablet (5 mg total) by mouth every 6 (six) hours as needed for severe pain.  . pantoprazole (PROTONIX) 40 MG tablet Take 1 tablet (40 mg total) by mouth 2 (two) times daily.  Marland Kitchen PARoxetine (PAXIL) 10 MG tablet Take 1 tablet (10 mg total) by mouth daily.  . prochlorperazine (COMPAZINE) 10 MG tablet Take 10 mg by mouth every 6 (six) hours as needed.  . ranitidine (ZANTAC) 300 MG tablet Take 1 tablet (300 mg total) by mouth daily.  . tamoxifen (NOLVADEX) 20 MG tablet Take 20 mg by mouth daily.  Marland Kitchen albuterol (PROVENTIL HFA;VENTOLIN HFA) 108 (90 BASE) MCG/ACT inhaler Inhale 2 puffs into the lungs every 6 (six) hours as needed for wheezing or shortness of breath. (Patient not taking: Reported on 11/30/2014)  . [DISCONTINUED] hydrOXYzine (ATARAX/VISTARIL) 25 MG tablet Take 25 mg by mouth daily.  . [DISCONTINUED] LORAZEPAM PO Take by mouth. Take 2nd for nausea    Orders for this visit: Orders Placed This Encounter  Procedures  . Ambulatory Referral for DME    Referral Priority:  Routine    Referral Type:  Durable Medical Equipment Purchase     Number of Visits Requested:  1    Thank  you for the visitation and for allowing  [facility] to assist in the care of your patient. Our recommendations are noted above.  Please contact us if we can be of further service.  Vilinda Boehringer, MD [facility] Office Number: [phone]

## 2014-12-01 ENCOUNTER — Encounter: Payer: Self-pay | Admitting: Internal Medicine

## 2014-12-04 NOTE — Assessment & Plan Note (Signed)
Differential diagnosis includes: Sequela of pneumonia, relation to autoimmune disease, radiation sequela, chemotherapy sequela, cardiac, obstructive lung disease, restrictive lung disease, deconditioning  Patient with complex medical history making a single etiology for her dyspnea very difficult. However, dyspnea seems to be worse since her recent multilobar pneumonia in December of 2015. Dyspnea is most likely multifactorial in nature as sequelae of pneumonia, deconditioning, cardiac stress given a BNP of 3300 and elevated troponins, possible sequelae of radiation therapy, and lastly in effective treatment for sleep apnea.  Patient has follow up with [facility] cardiology she will have a nuclear stress test and an echocardiogram performed.  Pulmonary function testing with no significant obstruction, she does have mild restriction with decreased ERV which could be due to body habitus.  Plan - Patient consult on diet and exercise as tolerated. -Sleep study with mild obstructive sleep apnea, started on CPAP (AutoPap 5-15) -Continue follow-up with [place] cardiology for further evaluation of any underlying cardiomyopathy or valvular disorder that may have developed secondary to breast cancer therapy treatment with chemotherapy and radiation in the past.

## 2014-12-04 NOTE — Assessment & Plan Note (Signed)
Patient with known history of sleep apnea, no followup in the past 7 years. Current states that he would AHI 14.3, supine AHI 55.1, The results were discussed with the patient, untreated subcutaneous sleep apnea could be adding to her generalized fatigue and overall dyspnea on exertion. Patient is adamant about not having a split-night study, we have agreed to give a AutoPap trial 5-15.   OSA  Encouraged proper weight management.  Excessive weight may contribute to snoring.  Monitor sedative use.  Discussed driving precautions and its relationship with hypersomnolence.  Discussed operating dangerous equipment and its relationship with hypersomnolence.  Discussed sleep hygiene, and benefits of a fixed sleep waked time.  The importance of getting eight or more hours of sleep discussed with patient.  Discussed limiting the use of the computer and television before bedtime.  Decrease naps during the day, so night time sleep will become enhanced.  Limit caffeine, and sleep deprivation.  HTN, stroke, and heart failure are potential risk factors.     Plan: AHI 14.3, supine AHI 55, AutoPap-5-15 cm of water

## 2014-12-06 ENCOUNTER — Encounter: Payer: Self-pay | Admitting: Internal Medicine

## 2014-12-07 ENCOUNTER — Encounter: Payer: Self-pay | Admitting: *Deleted

## 2014-12-12 ENCOUNTER — Telehealth: Payer: Self-pay | Admitting: Internal Medicine

## 2014-12-12 DIAGNOSIS — Z9989 Dependence on other enabling machines and devices: Principal | ICD-10-CM

## 2014-12-12 DIAGNOSIS — G4733 Obstructive sleep apnea (adult) (pediatric): Secondary | ICD-10-CM

## 2014-12-12 NOTE — Telephone Encounter (Signed)
Pt is asking for a new order for a new cpap machine. She needs this before the end of Month, so that her ins will cover.

## 2014-12-12 NOTE — Telephone Encounter (Signed)
OK We can try to get her a new cpap machine, no guarantee. New cpap machine, autopap 5-15cmH2O

## 2014-12-13 NOTE — Telephone Encounter (Signed)
lmomtcb x1 for pt Order placed  

## 2014-12-13 NOTE — Telephone Encounter (Signed)
Pt returned call  [phone]

## 2014-12-13 NOTE — Telephone Encounter (Signed)
I called made pt aware. Nothing further needed 

## 2014-12-15 ENCOUNTER — Encounter: Payer: Self-pay | Admitting: Internal Medicine

## 2014-12-15 ENCOUNTER — Ambulatory Visit (INDEPENDENT_AMBULATORY_CARE_PROVIDER_SITE_OTHER): Payer: BLUE CROSS/BLUE SHIELD | Admitting: Endocrinology

## 2014-12-15 ENCOUNTER — Encounter: Payer: Self-pay | Admitting: Endocrinology

## 2014-12-15 ENCOUNTER — Ambulatory Visit (INDEPENDENT_AMBULATORY_CARE_PROVIDER_SITE_OTHER): Payer: BLUE CROSS/BLUE SHIELD | Admitting: Internal Medicine

## 2014-12-15 VITALS — BP 112/74 | HR 84 | Temp 97.5°F | Ht 64.5 in | Wt 181.5 lb

## 2014-12-15 VITALS — BP 118/74 | HR 80 | Temp 97.5°F | Resp 14 | Wt 181.0 lb

## 2014-12-15 DIAGNOSIS — E1165 Type 2 diabetes mellitus with hyperglycemia: Secondary | ICD-10-CM

## 2014-12-15 DIAGNOSIS — F4323 Adjustment disorder with mixed anxiety and depressed mood: Secondary | ICD-10-CM

## 2014-12-15 DIAGNOSIS — R419 Unspecified symptoms and signs involving cognitive functions and awareness: Secondary | ICD-10-CM

## 2014-12-15 DIAGNOSIS — F419 Anxiety disorder, unspecified: Secondary | ICD-10-CM

## 2014-12-15 DIAGNOSIS — R0600 Dyspnea, unspecified: Secondary | ICD-10-CM

## 2014-12-15 DIAGNOSIS — IMO0002 Reserved for concepts with insufficient information to code with codable children: Secondary | ICD-10-CM

## 2014-12-15 DIAGNOSIS — M3500 Sicca syndrome, unspecified: Secondary | ICD-10-CM

## 2014-12-15 DIAGNOSIS — G894 Chronic pain syndrome: Secondary | ICD-10-CM

## 2014-12-15 LAB — COMPREHENSIVE METABOLIC PANEL
ALT: 30 U/L (ref 0–35)
AST: 25 U/L (ref 0–37)
Albumin: 4.3 g/dL (ref 3.5–5.2)
Alkaline Phosphatase: 108 U/L (ref 39–117)
BUN: 14 mg/dL (ref 6–23)
CO2: 30 mEq/L (ref 19–32)
Calcium: 9.4 mg/dL (ref 8.4–10.5)
Chloride: 101 mEq/L (ref 96–112)
Creatinine, Ser: 0.93 mg/dL (ref 0.40–1.20)
GFR: 66.07 mL/min (ref >60.00)
Glucose, Bld: 200 mg/dL — ABNORMAL HIGH (ref 70–99)
Potassium: 4.2 mEq/L (ref 3.5–5.1)
Sodium: 136 mEq/L (ref 135–145)
Total Bilirubin: 0.3 mg/dL (ref 0.2–1.2)
Total Protein: 7.5 g/dL (ref 6.0–8.3)

## 2014-12-15 LAB — HEMOGLOBIN A1C: Hgb A1c MFr Bld: 7.9 % — ABNORMAL HIGH (ref 4.6–6.5)

## 2014-12-15 MED ORDER — OXYCODONE HCL 5 MG PO TABS: 5.0000 mg | ORAL_TABLET | Freq: Four times a day (QID) | ORAL | Status: DC | PRN

## 2014-12-15 MED ORDER — INSULIN GLARGINE 100 UNIT/ML SOLOSTAR PEN: 12.0000 [IU] | PEN_INJECTOR | Freq: Every day | SUBCUTANEOUS | Status: DC

## 2014-12-15 MED ORDER — GLIPIZIDE 5 MG PO TABS: ORAL_TABLET | ORAL | Status: DC

## 2014-12-15 MED ORDER — ALPRAZOLAM 0.5 MG PO TABS: 0.5000 mg | ORAL_TABLET | Freq: Four times a day (QID) | ORAL | Status: DC | PRN

## 2014-12-15 MED ORDER — PAROXETINE HCL 20 MG PO TABS: 20.0000 mg | ORAL_TABLET | Freq: Every day | ORAL | Status: DC

## 2014-12-15 MED ORDER — INSULIN PEN NEEDLE 32G X 4 MM MISC: Status: DC

## 2014-12-15 NOTE — Assessment & Plan Note (Signed)
Diffuse pain related to Sjogren's Syndrome, made much worse after recent chemotherapy. No improvement in past with NSAIDs. Encouraged her to start Plaquenil. Will continue Oxycodone, however discussed limitations of Opiate use. Will set up evaluation with pain management.

## 2014-12-15 NOTE — Assessment & Plan Note (Signed)
Lab Results  Component Value Date   HGBA1C 7.9* 12/15/2014   BG significantly improved. Continue current insulin regimen. Follow up with endocrine pending for today.

## 2014-12-15 NOTE — Patient Instructions (Signed)
Start Plaquenil as directed.  Follow up in 3 months.

## 2014-12-15 NOTE — Patient Instructions (Signed)
Check sugars 2-3 times daily for the rest of the week. Send me numbers in 4-5 days for further adjustments.   Continue current medications.  Lets see what the A1c shows today.  Please come back for a follow-up appointment in 3 months

## 2014-12-15 NOTE — Assessment & Plan Note (Signed)
Severe anxiety and depression with recent treatment for breast cancer and ongoing chronic pain issues with Sjogren's Syndrome. Offered support today. Encouraged counseling and will increase Paxil to 20mg  daily. Follow up in 3 months and prn.

## 2014-12-15 NOTE — Assessment & Plan Note (Signed)
Recent A1c not controlled. A1c pending at this visit.  Forgot to bring her meter to this appointment. She was asked to check her sugars at later parts of the day and fwd me her numbers in 3-4 days for further adjustments. Also, will await results of A1c. By recall her morning numbers are mostly at target.   Continue current medications- Glipizide and lantus. If A1c well above target, then will consider adding DPPIV back. Did explain med profile and risks of pancreatitis with this medication.

## 2014-12-15 NOTE — Assessment & Plan Note (Signed)
Reviewed notes from pulmonary medicine. Stress test results pending. Will follow.

## 2014-12-15 NOTE — Progress Notes (Signed)
Pre visit review using our clinic review tool, if applicable. No additional management support is needed unless otherwise documented below in the visit note. 

## 2014-12-15 NOTE — Assessment & Plan Note (Signed)
Recent difficulty with cognition after completing chemotherapy. Encouraged her to consider testing for ADD. Some previous studies looking at use of stimulant meds after chemotherapy showed potential benefit. She prefers to hold off for now. Will follow.

## 2014-12-15 NOTE — Progress Notes (Signed)
Subjective:    Patient ID: Kristina Mccoy, female    DOB: 01-Apr-1956, 59 y.o.   MRN: [id number]  HPI  59YO female presents for follow up.  Sjogren's - Seen by rheumatology 3/9. Ordered Plaquenil, but has not yet started because of concern about side effects. Plans to start this weekend. Continues to have dry eyes, dry mouth, diffuse joint pain, fatigue, and dyspnea.  Joint pain has been severe over last few months. Had pain prior to chemotherapy, however pain has been much worse since then. No improvement with NSAIDs, Tramadol, Tylenol. Currently using Oxycodone prn for pain, but trying to limit use as much as possible.  Dyspnea - Had stress test yesterday. Results are pending. Continues to feels short of breath even at rest. Recent CT chest reportedly normal. PFTs normal.  Cognitive issues - Feels that it is difficult to make even simple decisions ever since having chemotherapy. Even simple decisions such as what shoes to buy. Tearful describing this. Does not feel that she will be able to return to work, because she cannot think clearly. Takes much longer to get ready. Feels anxious in social situations and with getting ready to go out of her home.  Past medical, surgical, family and social history per today's encounter.  Review of Systems  Constitutional: Positive for fatigue. Negative for fever, chills, appetite change and unexpected weight change.  Eyes: Negative for visual disturbance.  Respiratory: Positive for shortness of breath. Negative for cough.   Cardiovascular: Negative for chest pain and leg swelling.  Gastrointestinal: Negative for nausea, vomiting, abdominal pain, diarrhea and constipation.  Musculoskeletal: Positive for myalgias, back pain and arthralgias.  Skin: Negative for color change and rash.  Neurological: Negative for dizziness, weakness, light-headedness and headaches.  Hematological: Negative for adenopathy. Does not bruise/bleed easily.    Psychiatric/Behavioral: Positive for confusion, sleep disturbance, dysphoric mood, decreased concentration and agitation. Negative for suicidal ideas. The patient is nervous/anxious.        Objective:    BP 112/74 mmHg  Pulse 84  Temp(Src) 97.5 F (36.4 C) (Oral)  Ht 5' 4.5" (1.638 m)  Wt 181 lb 8 oz (82.328 kg)  BMI 30.68 kg/m2  SpO2 99% Physical Exam  Constitutional: She is oriented to person, place, and time. She appears well-developed and well-nourished. No distress.  HENT:  Head: Normocephalic and atraumatic.  Right Ear: External ear normal.  Left Ear: External ear normal.  Nose: Nose normal.  Mouth/Throat: Oropharynx is clear and moist. No oropharyngeal exudate.  Eyes: Conjunctivae are normal. Pupils are equal, round, and reactive to light. Right eye exhibits no discharge. Left eye exhibits no discharge. No scleral icterus.  Neck: Normal range of motion. Neck supple. No tracheal deviation present. No thyromegaly present.  Cardiovascular: Normal rate, regular rhythm, normal heart sounds and intact distal pulses.  Exam reveals no gallop and no friction rub.   No murmur heard. Pulmonary/Chest: Effort normal and breath sounds normal. No accessory muscle usage. No tachypnea. No respiratory distress. She has no decreased breath sounds. She has no wheezes. She has no rhonchi. She has no rales. She exhibits no tenderness.  Musculoskeletal: Normal range of motion. She exhibits no edema or tenderness.       Right shoulder: She exhibits pain.       Left shoulder: She exhibits pain.  Lymphadenopathy:    She has no cervical adenopathy.  Neurological: She is alert and oriented to person, place, and time. No cranial nerve deficit. She exhibits normal muscle tone.  Coordination normal.  Skin: Skin is warm and dry. No rash noted. She is not diaphoretic. No erythema. No pallor.  Psychiatric: Her speech is normal and behavior is normal. Judgment and thought content normal. Her mood appears  anxious. She exhibits a depressed mood. She expresses no suicidal ideation.          Assessment & Plan:  Over 18min of which >50% spent in face-to-face contact with patient discussing plan of care  Problem List Items Addressed This Visit      Unprioritized   Adjustment disorder with mixed anxiety and depressed mood    Severe anxiety and depression with recent treatment for breast cancer and ongoing chronic pain issues with Sjogren's Syndrome. Offered support today. Encouraged counseling and will increase Paxil to 20mg  daily. Follow up in 3 months and prn.      Anxiety   Relevant Medications   PARoxetine (PAXIL) tablet   ALPRAZolam (XANAX) tablet   Chronic pain disorder    Diffuse pain related to Sjogren's Syndrome, made much worse after recent chemotherapy. No improvement in past with NSAIDs. Encouraged her to start Plaquenil. Will continue Oxycodone, however discussed limitations of Opiate use. Will set up evaluation with pain management.      Relevant Orders   Ambulatory referral to Pain Clinic   Cognitive complaints    Recent difficulty with cognition after completing chemotherapy. Encouraged her to consider testing for ADD. Some previous studies looking at use of stimulant meds after chemotherapy showed potential benefit. She prefers to hold off for now. Will follow.      Diabetes type 2, uncontrolled    Lab Results  Component Value Date   HGBA1C 7.9* 12/15/2014   BG significantly improved. Continue current insulin regimen. Follow up with endocrine pending for today.      Relevant Orders   Comprehensive metabolic panel (Completed)   Hemoglobin A1c (Completed)   Dyspnea    Reviewed notes from pulmonary medicine. Stress test results pending. Will follow.      Gougerout-Sjoegren syndrome - Primary    Reviewed notes from [facility]. Symptoms, exam and labs c/w Sjogren's syndrome. Reviewed with pt today. Encouraged her to start Plaquenil. Will continue Oxycodone  prn for severe joint, however we discussed long term limitations of use of opiates for pain. Will set up evaluation with pain management.          Return in about 3 months (around 03/17/2015) for Recheck.

## 2014-12-15 NOTE — Assessment & Plan Note (Signed)
Reviewed notes from [facility]. Symptoms, exam and labs c/w Sjogren's syndrome. Reviewed with pt today. Encouraged her to start Plaquenil. Will continue Oxycodone prn for severe joint, however we discussed long term limitations of use of opiates for pain. Will set up evaluation with pain management.

## 2014-12-15 NOTE — Progress Notes (Signed)
Reason for visit-  Kristina Mccoy is a 59 y.o.-year-old female, here for follow up  of Type 2 diabetes, uncontrolled, without complications. Last seen Feb 2016.    HPI- Diagnosed in 2000. Recalls being initially on lifestyle modifications.   Prior treatment hx-   - Metformin- didn't tolerate due to GI symptoms - Januvia was taken off due to "scare of side effects".  - well controlled till 2014, till she was dxed with breast cancer. Then during chemotherapy was on levemir and novolog around December 2014.  - Didn't tolerate levemir, and stopped August 2015- due to feeling bloated with medication and skin itching.  - ? Rash/Hives with prior Novolog use.  -Glyburide switched to Glipizide at visit in 05/2014 - Invokana started 05/2014 - has had 2 yeast infections since then and med stopped October 2015 -Pt never started Victoza per discussion Oct 2015 due to fear of side effects due to Nausea/cancer risk   Pt is currently on a regimen of: - Glipizide 10mg  twice daily >>5 po qam and 10 po qpm ( last visit) -Lantus 12 units daily at bedtime   *Tolerating welll  Last hemoglobin A1c was: A1c drawn today- results awaited Lab Results  Component Value Date   HGBA1C 10.6* 09/13/2014   HGBA1C 10.0* 05/04/2014   HGBA1C 9.4* 01/24/2014    Pt checks her sugars 1- 2 times a day . Nature conservation officer. Likes Freestyle lite better. By meter review:  PREMEAL Breakfast Lunch Dinner Bedtime Overall  Glucose range: 120-150      Mean/median:        Hypoglycemia/Hyperglycemia-  1 recent lowish sugar of 82 prior to dinner. Felt symptomatic and corrected her sugar as well as ate dinner- post meal sugar was 200s.  she has hypoglycemia awareness at 45.    Dietary habits- eats three times daily. Tries to limit carbs, sweetened beverages, sodas, desserts. Not eating sweets and junk foods. Exercise- used to walk for exercise mainly. Had started more intense exercise, but had to stop it recently  due to joint pain. Also, SOB/recent hospitalization limits her exercise ability. Got stress test yesterday for evaluation. Rheum told her to limit sun exposure.  Weight -   Wt Readings from Last 3 Encounters:  12/15/14 181 lb (82.101 kg)  12/15/14 181 lb 8 oz (82.328 kg)  11/30/14 185 lb (83.915 kg)    Diabetes Complications- No known complications Nephropathy-- No  CKD, last BUN/creatinine- Lab Results  Component Value Date   BUN 12 10/04/2014   CREATININE 0.8 10/04/2014    Lab Results  Component Value Date   GFR 74.35 10/04/2014   Lab Results  Component Value Date   MICRALBCREAT 1.4 05/04/2014    Retinopathy- No, Last DEE- Nov 2015. Neuropathy-  Reports stable numbness and tingling in her toes and hands. No known neuropathy.  Associated history - No history of CAD or prior stroke. Has PVD. No hypothyroidism. her last TSH was  Lab Results  Component Value Date   TSH 0.63 05/04/2014     Blood Pressure- Patient's blood pressure is well controlled today.  Hyperlipidemia- her last set of lipids were- Lab Results  Component Value Date   CHOL 210* 05/04/2014   HDL 44.90 05/04/2014   LDLCALC 115* 01/24/2014   LDLDIRECT 155.3 05/04/2014   TRIG 239.0* 05/04/2014   CHOLHDL 5 05/04/2014   I have reviewed the patient's past medical history,  medications and allergies.   Current Outpatient Prescriptions on File Prior to Visit  Medication  Sig Dispense Refill  . albuterol (PROVENTIL HFA;VENTOLIN HFA) 108 (90 BASE) MCG/ACT inhaler Inhale 2 puffs into the lungs every 6 (six) hours as needed for wheezing or shortness of breath. 1 Inhaler 2  . ALPRAZolam (XANAX) 0.5 MG tablet Take 1 tablet (0.5 mg total) by mouth 4 (four) times daily as needed for anxiety. 120 tablet 1  . BAYER CONTOUR NEXT TEST test strip CHECK BLOOD SUGAR SIX TIMES PER DAY AS DIRECTED 100 each 5  . cetirizine (ZYRTEC) 10 MG tablet Take 10 mg by mouth 2 (two) times daily.    . clobetasol ointment (TEMOVATE) 1.06  % Apply 1 application topically 2 (two) times daily.    . Emollient (CERAVE) CREA Apply topically.    . hydroxychloroquine (PLAQUENIL) 200 MG tablet Take 200 mg by mouth.    . hyoscyamine (LEVSIN, ANASPAZ) 0.125 MG tablet Take 1 tablet (0.125 mg total) by mouth every 4 (four) hours as needed. 30 tablet 6  . Lancets (FREESTYLE) lancets USE TWICE A DAY 100 each 5  . oxyCODONE (OXY IR/ROXICODONE) 5 MG immediate release tablet Take 1 tablet (5 mg total) by mouth every 6 (six) hours as needed for severe pain. 120 tablet 0  . pantoprazole (PROTONIX) 40 MG tablet Take 1 tablet (40 mg total) by mouth 2 (two) times daily. 60 tablet 3  . PARoxetine (PAXIL) 20 MG tablet Take 1 tablet (20 mg total) by mouth daily. 90 tablet 1  . prochlorperazine (COMPAZINE) 10 MG tablet Take 10 mg by mouth every 6 (six) hours as needed.    . ranitidine (ZANTAC) 300 MG tablet Take 1 tablet (300 mg total) by mouth daily. 90 tablet 3  . tamoxifen (NOLVADEX) 20 MG tablet Take 20 mg by mouth daily.     No current facility-administered medications on file prior to visit.   Allergies  Allergen Reactions  . Gabapentin Itching and Swelling  . Buprenorphine Hcl   . Methylprednisolone     Reddened face, puffy face   . Morphine And Related   . Pregabalin Swelling    Swelling in arms and legs    Review of Systems- [ x ]  Complains of    [  ]  denies [  ]  vision difficulty [  ] chest pain [ x ] shortness of breath-improving after recent Hospital admission [ x ] leg swelling- pulm following [  ] cough [ x ] nausea/ no vomiting [  ] diarrhea [  ] constipation [  ] abdominal pain [ x ]  tingling/numbness in extremities [  ]  concern with feet ( wounds/sores)    PE: BP 118/74 mmHg  Pulse 80  Temp(Src) 97.5 F (36.4 C) (Oral)  Resp 14  Wt 181 lb (82.101 kg)  SpO2 97% Wt Readings from Last 3 Encounters:  12/15/14 181 lb (82.101 kg)  12/15/14 181 lb 8 oz (82.328 kg)  11/30/14 185 lb (83.915 kg)      ASSESSMENT:   Problem List Items Addressed This Visit      Other   Diabetes type 2, uncontrolled - Primary    Recent A1c not controlled. A1c pending at this visit.  Forgot to bring her meter to this appointment. She was asked to check her sugars at later parts of the day and fwd me her numbers in 3-4 days for further adjustments. Also, will await results of A1c. By recall her morning numbers are mostly at target.   Continue current medications- Glipizide and lantus. If A1c  well above target, then will consider adding DPPIV back. Did explain med profile and risks of pancreatitis with this medication.              Relevant Medications   glipiZIDE (GLUCOTROL) tablet   LANTUS SOLOSTAR 100 UNIT/ML [place] SOLN   Insulin Pen Needle (BD PEN NEEDLE NANO U/F) 32G X 4 MM MISC      - Return to clinic in 3 mo with sugar log/meter.  Bynum Bellows [facility] 12/15/2014 3:51 PM

## 2014-12-19 ENCOUNTER — Encounter: Payer: Self-pay | Admitting: Internal Medicine

## 2014-12-20 ENCOUNTER — Telehealth: Payer: Self-pay

## 2014-12-20 ENCOUNTER — Encounter: Payer: Self-pay | Admitting: Endocrinology

## 2014-12-20 MED ORDER — SITAGLIPTIN PHOSPHATE 50 MG PO TABS: 50.0000 mg | ORAL_TABLET | Freq: Every day | ORAL | Status: DC

## 2014-12-20 NOTE — Telephone Encounter (Signed)
-----   Message from Haydee Monica, MD sent at 12/20/2014  9:11 AM EDT ----- Regarding: call patient please Please let her know that I have reviewed her A1c- big improvement as compared to last time. However, it is still not at target.  Please send me your sugars over the last few days.  I would recommend adding januvia 50 mg daily, per our discussion in clinic. This medication does have a small risk of pancreatitis like we discussed. Please let me know if you want to add this and I can send in Rx. thanks

## 2014-12-20 NOTE — Telephone Encounter (Signed)
Spoke to patient to notify her of Dr. Boyd Kerbs comments. Patient verbalized understanding. Patient stated that she would send sugar readings to dr. Howell Rucks in a mychart message. Patient is agreeable to start Januvia 50mg  daily. Rx has been sent to [place].

## 2015-01-06 ENCOUNTER — Encounter: Payer: Self-pay | Admitting: *Deleted

## 2015-01-06 ENCOUNTER — Other Ambulatory Visit: Payer: Self-pay | Admitting: *Deleted

## 2015-01-06 DIAGNOSIS — R0600 Dyspnea, unspecified: Secondary | ICD-10-CM

## 2015-01-06 DIAGNOSIS — Z9989 Dependence on other enabling machines and devices: Principal | ICD-10-CM

## 2015-01-06 DIAGNOSIS — G4733 Obstructive sleep apnea (adult) (pediatric): Secondary | ICD-10-CM

## 2015-01-12 ENCOUNTER — Encounter: Payer: Self-pay | Admitting: Internal Medicine

## 2015-01-12 DIAGNOSIS — G894 Chronic pain syndrome: Secondary | ICD-10-CM

## 2015-01-16 ENCOUNTER — Other Ambulatory Visit: Payer: Self-pay | Admitting: *Deleted

## 2015-01-16 ENCOUNTER — Other Ambulatory Visit: Payer: Self-pay | Admitting: Internal Medicine

## 2015-01-16 MED ORDER — OXYCODONE HCL 5 MG PO TABS: 5.0000 mg | ORAL_TABLET | Freq: Three times a day (TID) | ORAL | Status: DC | PRN

## 2015-01-16 MED ORDER — OXYCODONE HCL 5 MG PO TABS: 5.0000 mg | ORAL_TABLET | Freq: Two times a day (BID) | ORAL | Status: DC | PRN

## 2015-01-21 NOTE — Consult Note (Signed)
Brief Consult Note: Diagnosis: elevated TnI likely due to supply demand ischemia. Pneumonia.   Patient was seen by consultant.   Consult note dictated.   Comments: recommend Lexiscan myoview once she recovers from current illness.  Electronic Signatures: Kathlyn Sacramento (MD)  (Signed 03-Dec-15 11:10)  Authored: Brief Consult Note   Last Updated: 03-Dec-15 11:10 by Kathlyn Sacramento (MD)

## 2015-01-21 NOTE — H&P (Signed)
PATIENT NAME:  Kristina Mccoy, Kristina Mccoy MR#:  [id number] DATE OF BIRTH:  Feb 05, 1956  DATE OF ADMISSION:  08/31/2014  PRIMARY CARE PHYSICIAN: Ronette Deter, MD  CHIEF COMPLAINT: Chest pain and shortness of breath.   HISTORY OF PRESENT ILLNESS: This is a 59 year old female who presents to the Emergency Room complaining of left-sided flank/chest pain radiating to her back, also associated with shortness of breath. The patient had similar symptoms a few months back but were more on the right side. She was seen by her primary care physician, had a CT chest and chest x-ray done but cannot recall what the exact diagnosis was. She now has developed the same symptoms on the left side. She came to the ER and was noted to be severely hypoxic with room air oxygen saturations in the 60s. She was placed on a nonrebreather, given some pain medicine and her symptoms have improved, but she continues to be hypoxic. The patient was noted to have a mildly elevated troponin at 0.26, also had CT chest findings suggestive of multifocal pneumonia on the right upper and left lower lobe. Hospitalist services were contacted for further treatment and evaluation.   REVIEW OF SYSTEMS:  CONSTITUTIONAL: No documented fever. No weight gain, no weight loss.  EYES: No blurry or double vision.  EARS, NOSE, AND THROAT: No tinnitus or postnasal drip. No redness of the oropharynx.  RESPIRATORY: Positive cough. No wheeze. No hemoptysis. Positive dyspnea.  CARDIOVASCULAR: Positive pleuritic chest pain. No orthopnea, no palpitations, syncope.  GASTROINTESTINAL: Positive nausea but no vomiting. No diarrhea. No abdominal pain. No melena or hematochezia.  GENITOURINARY: No dysuria or hematuria.  ENDOCRINE: No polyuria or nocturia, heat or cold intolerance.  HEMATOLOGIC: No anemia. No bruising. No bleeding.  INTEGUMENTARY: No rashes. No lesions.  MUSCULOSKELETAL: No arthritis. No swelling. No gout.  NEUROLOGIC: No numbness, tingling. No ataxia.  No seizure activity.  PSYCHIATRIC: Positive anxiety. No insomnia. No ADD.   PAST MEDICAL HISTORY: Consistent with hypertension, history of breast cancer, diabetes, GERD, anxiety.   ALLERGIES: GABAPENTIN AND LYRICA, BOTH OF WHICH CAUSE ANAPHYLAXIS.   SOCIAL HISTORY: Used to be a smoker, quit in June 2014, does have a 20 pack-year smoking history. No alcohol abuse. No illicit drug abuse.   FAMILY HISTORY: Mother and father both deceased. Mother died from congestive heart failure. Father died from pancreatic cancer.   CURRENT MEDICATIONS: As follows: Xanax 0.5 mg 1 tablet 4 times daily as needed, glipizide 5 mg 2 tablets b.i.d., hydroxyzine 25 mg daily at bedtime, Protonix 40 mg b.i.d., Zyrtec 10 mg b.i.d.   PHYSICAL EXAMINATION: Presently is as follows:  VITAL SIGNS: Temperature 97.8, pulse 104, respirations 22, blood pressure 124/84, saturation is 100% on nonrebreather.  GENERAL: She is a pleasant-appearing female but in mild to moderate distress.  HEENT: Atraumatic, normocephalic. Extraocular muscles are intact. Pupils are equal and reactive to light. Sclerae are nonicteric. No conjunctival injection. No pharyngeal erythema.  NECK: Supple. No jugular venous distention. No bruits, no lymphadenopathy, no thyromegaly. HEART: Tachycardic, regular. No murmurs, no rubs, no clicks.  LUNGS: Poor respiratory effort due to splinting, but faint bibasilar crackles. Positive use of accessory muscles. No dullness to percussion.  ABDOMEN: Soft, flat, nontender, nondistended. Has good bowel sounds. No hepatosplenomegaly appreciated.  EXTREMITIES: No evidence of any cyanosis, clubbing, or peripheral edema. Has +2 pedal and radial pulses bilaterally.  NEUROLOGICAL: The patient is alert, awake and oriented x 3 with no focal motor or sensory deficits appreciated bilaterally.  SKIN: Moist and warm  with no rashes appreciated.  LYMPHATIC: There is no cervical or axillary lymphadenopathy.   LABORATORY DATA: Showed  a serum glucose of 375, BUN 16, creatinine 1.05, sodium 126, potassium 4.4, chloride 91, bicarbonate 23. LFTs are within normal limits. CK 765, CK-MB 8.5, troponin 0.26. Urinalysis within normal limits.   IMAGING: The patient did have a chest x-ray done which showed no acute finding with low volumes. The patient also had a CT of the chest done with contrast showing no evidence of pulmonary emboli, mild bilateral pleural effusions with subsegmental atelectasis, probable subsegmental atelectasis seen in the left upper lobe, right upper lobe pneumonia or subsegmental atelectasis is also noted.   ASSESSMENT AND PLAN: This is a 59 year old female with history of hypertension, breast cancer, diabetes, gastroesophageal reflux disease, anxiety, who presents to the hospital with left flank pain radiating to the back sometimes also noted to have elevated troponin. CT chest showing pneumonia.  1.  Chest pain. The exact etiology is unclear, but I suspect this is probably related to pneumonia with pleurisy. Her EKG shows no acute ST changes other than sinus tachycardia, although she does have elevated troponin; therefore, I will observe her on telemetry, follow serial cardiac markers. We will give aspirin, start her on low-dose beta blocker. We will get a cardiology consult. The patient was seen by Dr. Fletcher Anon recently.  2.  Pneumonia with pleurisy, likely cause of the patient's chest pain. CT chest is negative for pulmonary embolism. I will give IV Levaquin for community-acquired pneumonia. I will give IV Toradol for pleurisy. Follow blood and sputum cultures.  3.  Diabetes. Continue her glipizide. Carbohydrate-controlled diet. 4.  Gastroesophageal reflux disease. Continue Protonix.   5.  Anxiety. Continue as needed Xanax.  6.  Hyponatremia, likely hypovolemic hyponatremia. I will give her gentle IV fluids, follow sodium.  7.  History of breast cancer, this is currently in remission.   CODE STATUS: The patient is a  full code.   TIME SPENT: Fifty minutes.    ____________________________ Belia Heman. Verdell Carmine, MD vjs:TT D: 08/31/2014 16:46:28 ET T: 08/31/2014 17:29:12 ET JOB#: [id number]  cc: Belia Heman. Verdell Carmine, MD, <Dictator> Henreitta Leber MD ELECTRONICALLY SIGNED 09/03/2014 15:59

## 2015-01-21 NOTE — Discharge Summary (Signed)
PATIENT NAME:  Kristina Mccoy, Kristina Mccoy MR#:  [id number] DATE OF BIRTH:  November 29, 1955  ADMITTING DIAGNOSIS:  Chest pain and shortness of breath.  DISCHARGE DIAGNOSES:  1.  Chest pain due to community-acquired pneumonia as a result of pleurisy from pneumonia.  2.  Pneumonia, community-acquired, significantly improved.  3.  Acute respiratory failure due to pneumonia, now resolved.  4.  Gastroesophageal reflux disease.  5.  Anxiety.  6.  Hyponatremia, felt to be due to hypovolemic hyponatremia.  7.  History of breast cancer.  8.  Urinary retention requiring Foley, but now no further symptoms.  9.  Elevated troponin felt to be due to supply-demand ischemia as a result of her pneumonia and  recommended outpatient followup.  CONSULTANTS: Muhammad A. Fletcher Anon, MD  PERTINENT LABORATORY AND EVALUATIONS: Admitting glucose 375, BUN 16, creatinine 1.05, sodium 126, potassium 4.4, chloride 91, CO2 was 23. LFTs were normal, except albumin of 2.9. Troponin was 0.26, 0.23, and 0.16. WBC was 11.4 on December 5. Hemoglobin A1c is 9.5.   HOSPITAL COURSE: Please refer to H and P done by the admitting physician. The patient is a 59 year old white female who presented with the complaint of chest pain and shortness of breath. She had a CT of the chest which confirmed findings of pneumonia. The patient was treated with antibiotics and she continued to have symptoms. She was slow to improve; however, she finally did improve. The patient also had slightly elevated troponin and was seen by cardiology. They recommended outpatient followup. Currently, her respiratory status is normalized and doing well and is stable.   DISCHARGE MEDICATIONS: Alprazolam 0.5 one tab 4 times a day as needed, Protonix 40 one tab p.o. b.i.d., hydroxyzine, hydrochlorothiazide 25 p.o. at bedtime, glipizide 5 mg 2 tabs b.i.d., Zyrtec 10 one tab p.o. b.i.d., levofloxacin 750 one tab p.o. daily x 4 days.   DIET: Low-sodium, low-fat, low-cholesterol,  carbohydrate-controlled diet.   ACTIVITY: As tolerated.   FOLLOWUP: With primary MD in 1-2 weeks.   TIME SPENT ON THIS DISCHARGE:  35 minutes.   ____________________________ Lafonda Mosses Posey Pronto, MD shp:LT D: 09/06/2014 15:16:12 ET T: 09/06/2014 21:04:28 ET JOB#: [id number]  cc: Saleh Ulbrich H. Posey Pronto, MD, <Dictator> Alric Seton MD ELECTRONICALLY SIGNED 09/09/2014 17:11

## 2015-01-25 NOTE — Consult Note (Signed)
PATIENT NAME:  Kristina Mccoy, MINCY MR#:  [id number] DATE OF BIRTH:  1955-12-05  DATE OF CONSULTATION:  09/01/2014  REFERRING PHYSICIAN:  Belia Heman. Verdell Carmine, MD.  PRIMARY CARE PHYSICIAN: Eduard Clos. Gilford Rile, MD.  REASON FOR CONSULTATION: Elevated troponin.   HISTORY OF PRESENT ILLNESS: This is a 59 year old female with no previous cardiac history. She has known history of hypertension, breast cancer, diabetes, GERD and anxiety. She was seen by me in July for evaluation of exertional dyspnea. She had an echocardiogram done which was completely normal. I suggested ischemic cardiac evaluation with a stress test at that time. However, the patient declined. She presented to the Emergency Room with left-sided sharp chest pain, worse with a deep breath and coughing. This has been associated with shortness of breath and cough. Upon presentation to the Emergency Room, she was noted to be significantly hypoxic with oxygen saturation in the 60s. She was also tachycardic. She was placed on nonrebreather and given pain medications for her pain. She was started on antibiotics. She was started on antibiotics.  She appears to be improved today. CT scan of the chest showed evidence of pneumonia in the right upper lobe with enlarged lymph nodes. She denies substernal chest tightness.   PAST MEDICAL HISTORY: Includes hypertension, breast cancer, diabetes, GERD and anxiety.   ALLERGIES INCLUDE:  GABAPENTIN, LYRICA, BOTH CAUSE ANAPHYLAXIS.   SOCIAL HISTORY: She used to smoke but quit in June of 2014. She denies alcohol or recreational drug use.   FAMILY HISTORY: Remarkable for congestive heart failure and cancer. There is no family history of coronary artery disease.   HOME MEDICATIONS  INCLUDE:  Xanax as needed, glipizide 5 mg 2 tablets twice daily, hydroxyzine 25 mg at bedtime, Protonix 40 mg twice a day and Zyrtec 10 mg twice daily.   REVIEW OF SYSTEMS: A 10-point review of systems review of systems was performed. It  is negative other than what is mentioned in the HPI.   PHYSICAL EXAMINATION: GENERAL: The patient appears to be at her stated age, in no acute distress.  VITAL SIGNS: Temperature is 97.2, pulse is 82, respiratory rate is 18, blood pressure is 113/78, and oxygen saturation is 92% on 6 liters nasal cannula.  HEENT: Normocephalic, atraumatic.  NECK: No JVD or carotid bruits.  RESPIRATORY: Normal respiratory effort with no use of accessory muscles. Auscultation reveals bronchial breath sounds at upper half of both lungs.  CARDIOVASCULAR: Normal PMI. Normal S1 and S2 with no gallops or murmurs.  ABDOMEN: Benign, nontender, and nondistended.  EXTREMITIES: No clubbing, cyanosis, or edema.  SKIN: Warm and dry with no rash.  PSYCHIATRIC: She is alert, oriented x 3 with normal mood and affect.   LABORATORY AND DIAGNOSTIC DATA: ECG showed sinus tachycardia with no significant ST or T wave changes. Labs showed normal renal function. Troponin was 0.26 and decreased to 0.16. White cell count initially was 29,000 and decreased to 21,000.   IMPRESSION: 1.  Elevated troponin, likely due to supply demand ischemia.  2.  Pneumonia.  3.  Chronic symptoms of exertional dyspnea.   RECOMMENDATIONS: The patient's elevated troponin is likely due to supply demand ischemia related to hypoxia and tachycardia. The underlying issue seems to be pneumonia, which is currently being treated and has improved overnight. She did have an echocardiogram done a few months ago which was completely unremarkable. I recommend continuing treatment of underlying pneumonia; continue aspirin and a small dose beta blocker if tolerated. Given her multiple risk factors for coronary artery disease,  I recommend evaluation with a pharmacologic nuclear stress test once her current illness is improved. This can be done in the inpatient or outpatient setting, depending on the situation.    ____________________________ Mertie Clause Fletcher Anon,  MD maa:DT D: 09/01/2014 11:15:48 ET T: 09/01/2014 11:50:21 ET JOB#: [id number]  cc: Rogue Jury A. Fletcher Anon, MD, <Dictator> Vivek J. Verdell Carmine, MD Eduard Clos Gilford Rile, MD Rogue Jury Ferne Reus MD ELECTRONICALLY SIGNED 09/30/2014 9:34

## 2015-02-02 ENCOUNTER — Ambulatory Visit: Payer: Self-pay | Admitting: Internal Medicine

## 2015-02-02 ENCOUNTER — Encounter: Payer: Self-pay | Admitting: Internal Medicine

## 2015-02-06 ENCOUNTER — Telehealth: Payer: Self-pay | Admitting: *Deleted

## 2015-02-06 NOTE — Telephone Encounter (Signed)
Fax from pharmacy, needing PA for Januvia. Started online, pending response.

## 2015-02-06 NOTE — Telephone Encounter (Signed)
Fax from [facility], [place] denied, note given to Dr. Howell Rucks for review. Needed to have tried [place], Tradjenta, and Radium.

## 2015-02-10 ENCOUNTER — Telehealth: Payer: Self-pay | Admitting: *Deleted

## 2015-02-10 MED ORDER — LINAGLIPTIN 5 MG PO TABS: 5.0000 mg | ORAL_TABLET | Freq: Every day | ORAL | Status: DC

## 2015-02-10 NOTE — Telephone Encounter (Signed)
Sent mychart notifying pt. Rx sent to pharmacy by escript

## 2015-02-10 NOTE — Telephone Encounter (Signed)
-----   Message from Haydee Monica, MD sent at 02/10/2015 12:19 PM EDT ----- Regarding: januvia denial.  Recd note from insurance regarding januvia PA denial.  Faythe Ghee to switch to Tradjenta 5 mg daily ( similar to [place] but made by different company).  Please inform patient and send in Rx.  thanks

## 2015-02-13 ENCOUNTER — Other Ambulatory Visit: Payer: Self-pay | Admitting: *Deleted

## 2015-02-13 ENCOUNTER — Encounter: Payer: Self-pay | Admitting: Internal Medicine

## 2015-02-13 ENCOUNTER — Telehealth: Payer: Self-pay | Admitting: *Deleted

## 2015-02-13 NOTE — Telephone Encounter (Signed)
Rx for Oxycodone was printed twice on 01/16/15, one with instructions to take BID and one to take TID.  Pt was given the Rx with instructions to take BID, Rx with instructions to take TID was shredded.  Removed Rx from med list which showed TID and added Oxycodone 5mg  with instruction to take BID as per the Rx that was given to the patient.

## 2015-02-22 ENCOUNTER — Ambulatory Visit (INDEPENDENT_AMBULATORY_CARE_PROVIDER_SITE_OTHER): Payer: 59 | Admitting: Internal Medicine

## 2015-02-22 ENCOUNTER — Encounter: Payer: Self-pay | Admitting: Internal Medicine

## 2015-02-22 VITALS — BP 106/72 | HR 83 | Temp 97.7°F | Ht 64.5 in | Wt 187.5 lb

## 2015-02-22 DIAGNOSIS — M3500 Sicca syndrome, unspecified: Secondary | ICD-10-CM

## 2015-02-22 DIAGNOSIS — R300 Dysuria: Secondary | ICD-10-CM | POA: Diagnosis not present

## 2015-02-22 DIAGNOSIS — G894 Chronic pain syndrome: Secondary | ICD-10-CM | POA: Diagnosis not present

## 2015-02-22 LAB — POCT URINALYSIS DIPSTICK
Bilirubin, UA: NEGATIVE
Blood, UA: NEGATIVE
Glucose, UA: 500
Ketones, UA: NEGATIVE
Nitrite, UA: NEGATIVE
Protein, UA: NEGATIVE
Spec Grav, UA: <=1.005
Urobilinogen, UA: 0.2
pH, UA: 6

## 2015-02-22 MED ORDER — DULOXETINE HCL 30 MG PO CPEP: 30.0000 mg | ORAL_CAPSULE | Freq: Every day | ORAL | Status: DC

## 2015-02-22 MED ORDER — OXYCODONE HCL 5 MG PO TABS: 5.0000 mg | ORAL_TABLET | Freq: Two times a day (BID) | ORAL | Status: DC

## 2015-02-22 MED ORDER — CYCLOBENZAPRINE HCL 5 MG PO TABS: 5.0000 mg | ORAL_TABLET | Freq: Three times a day (TID) | ORAL | Status: DC | PRN

## 2015-02-22 NOTE — Assessment & Plan Note (Signed)
Recent mild burning with urination. Will check UA with labs today.

## 2015-02-22 NOTE — Addendum Note (Signed)
Addended by: Johnsie Cancel on: 02/22/2015 08:29 AM   Modules accepted: Orders

## 2015-02-22 NOTE — Assessment & Plan Note (Signed)
Persistent symptoms of joint pain and stiffness. Unable to tolerate Plaquenil because of dizziness. Follow up pending. Question if she might be a good candidate for Embrel.

## 2015-02-22 NOTE — Patient Instructions (Signed)
Start Cymbalta 30mg  daily at bedtime.  Continue Ibuprofen 800mg  three times daily as needed for pain. Continue Tylenol 1000mg  up to three times daily as needed for pain.  Continue Oxycodone 5mg  twice daily for severe pain only.

## 2015-02-22 NOTE — Assessment & Plan Note (Signed)
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 Will give another trial of Cymbalta, last used in 2013. Continue prn Ibuprofen and Tylenol. Add Flexeril prn for muscle spasm. Continue prn Oxycodone for severe pain only. Evaluation at pain management pending.

## 2015-02-22 NOTE — Progress Notes (Signed)
Pre visit review using our clinic review tool, if applicable. No additional management support is needed unless otherwise documented below in the visit note. 

## 2015-02-22 NOTE — Progress Notes (Signed)
Subjective:    Patient ID: Kristina Mccoy, female    DOB: 1956-02-21, 59 y.o.   MRN: [id number]  HPI  59YO female presents for follow up.  Recently stopped taking Plaquenil briefly because of dizziness. Has follow up with rheumatology. Continues to have aching pain in joints including wrists, elbows, shoulders, knees. Pain limits mobility.  Chronic pain - Seen by pain management. Scheduled for psychological evaluation. Continues on Oxycodone bid and prn ibuprofen and tylenol. Trying to add some gentle stretching to her routine. Having severe aching back pain with "muscle spasm" at times. Cannot stand for prolonged periods.  Past medical, surgical, family and social history per today's encounter.  Review of Systems  Constitutional: Positive for fatigue. Negative for fever, chills, appetite change and unexpected weight change.  Eyes: Negative for visual disturbance.  Respiratory: Negative for shortness of breath.   Cardiovascular: Negative for chest pain and leg swelling.  Gastrointestinal: Negative for abdominal pain.  Musculoskeletal: Positive for myalgias, back pain and arthralgias. Negative for joint swelling and gait problem.  Skin: Negative for color change and rash.  Neurological: Negative for weakness.  Hematological: Negative for adenopathy. Does not bruise/bleed easily.  Psychiatric/Behavioral: Positive for sleep disturbance. Negative for dysphoric mood. The patient is not nervous/anxious.        Objective:    BP 106/72 mmHg  Pulse 83  Temp(Src) 97.7 F (36.5 C) (Oral)  Ht 5' 4.5" (1.638 m)  Wt 187 lb 8 oz (85.049 kg)  BMI 31.70 kg/m2  SpO2 100% Physical Exam  Constitutional: She is oriented to person, place, and time. She appears well-developed and well-nourished. No distress.  HENT:  Head: Normocephalic and atraumatic.  Right Ear: External ear normal.  Left Ear: External ear normal.  Nose: Nose normal.  Mouth/Throat: Oropharynx is clear and moist. No  oropharyngeal exudate.  Eyes: Conjunctivae are normal. Pupils are equal, round, and reactive to light. Right eye exhibits no discharge. Left eye exhibits no discharge. No scleral icterus.  Neck: Normal range of motion. Neck supple. No tracheal deviation present. No thyromegaly present.  Cardiovascular: Normal rate, regular rhythm, normal heart sounds and intact distal pulses.  Exam reveals no gallop and no friction rub.   No murmur heard. Pulmonary/Chest: Effort normal and breath sounds normal. No respiratory distress. She has no wheezes. She has no rales. She exhibits no tenderness.  Musculoskeletal: Normal range of motion. She exhibits no edema or tenderness.       Thoracic back: She exhibits pain. She exhibits normal range of motion and no tenderness.       Lumbar back: She exhibits pain. She exhibits normal range of motion and no tenderness.  Lymphadenopathy:    She has no cervical adenopathy.  Neurological: She is alert and oriented to person, place, and time. No cranial nerve deficit. She exhibits normal muscle tone. Coordination normal.  Skin: Skin is warm and dry. No rash noted. She is not diaphoretic. No erythema. No pallor.  Psychiatric: Her speech is normal and behavior is normal. Judgment and thought content normal. Cognition and memory are normal. She exhibits a depressed mood.          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 Will give another trial of Cymbalta, last used in 2013. Continue prn Ibuprofen and Tylenol.  Add Flexeril prn for muscle spasm. Continue prn Oxycodone for severe pain only. Evaluation at pain management pending.      Dysuria    Recent mild burning with urination. Will check UA with labs today.      Relevant Orders   POCT Urinalysis Dipstick     Gougerout-Sjoegren syndrome    Persistent symptoms of joint pain and stiffness. Unable to tolerate Plaquenil because of dizziness. Follow up pending. Question if she might be a good candidate for Embrel.          Return in about 4 weeks (around 03/22/2015) for Recheck.

## 2015-02-25 LAB — CULTURE, URINE COMPREHENSIVE: Colony Count: 75000

## 2015-02-28 ENCOUNTER — Other Ambulatory Visit: Payer: Self-pay | Admitting: *Deleted

## 2015-02-28 MED ORDER — CIPROFLOXACIN HCL 500 MG PO TABS: 500.0000 mg | ORAL_TABLET | Freq: Two times a day (BID) | ORAL | Status: DC

## 2015-03-08 ENCOUNTER — Encounter: Payer: Self-pay | Admitting: Internal Medicine

## 2015-03-10 ENCOUNTER — Other Ambulatory Visit: Payer: Self-pay | Admitting: Internal Medicine

## 2015-03-10 ENCOUNTER — Ambulatory Visit (INDEPENDENT_AMBULATORY_CARE_PROVIDER_SITE_OTHER)
Admission: RE | Admit: 2015-03-10 | Discharge: 2015-03-10 | Disposition: A | Discharge status: Home / Self Care | Payer: 59 | Source: Ambulatory Visit | Attending: Internal Medicine | Admitting: Internal Medicine

## 2015-03-10 ENCOUNTER — Ambulatory Visit (INDEPENDENT_AMBULATORY_CARE_PROVIDER_SITE_OTHER): Payer: 59 | Admitting: Internal Medicine

## 2015-03-10 ENCOUNTER — Encounter: Payer: Self-pay | Admitting: Internal Medicine

## 2015-03-10 VITALS — BP 120/80 | HR 71 | Temp 97.7°F | Resp 14 | Ht 64.0 in | Wt 189.2 lb

## 2015-03-10 DIAGNOSIS — F419 Anxiety disorder, unspecified: Secondary | ICD-10-CM | POA: Diagnosis not present

## 2015-03-10 DIAGNOSIS — R059 Cough, unspecified: Secondary | ICD-10-CM

## 2015-03-10 DIAGNOSIS — R300 Dysuria: Secondary | ICD-10-CM

## 2015-03-10 DIAGNOSIS — R05 Cough: Secondary | ICD-10-CM | POA: Diagnosis not present

## 2015-03-10 DIAGNOSIS — G894 Chronic pain syndrome: Secondary | ICD-10-CM | POA: Diagnosis not present

## 2015-03-10 LAB — POCT URINALYSIS DIPSTICK
Bilirubin, UA: NEGATIVE
Blood, UA: NEGATIVE
Glucose, UA: NEGATIVE
Ketones, UA: NEGATIVE
Nitrite, UA: NEGATIVE
Protein, UA: NEGATIVE
Spec Grav, UA: 1.01
Urobilinogen, UA: 0.2
pH, UA: 6.5

## 2015-03-10 MED ORDER — ALPRAZOLAM 0.5 MG PO TABS: 0.5000 mg | ORAL_TABLET | Freq: Four times a day (QID) | ORAL | Status: DC | PRN

## 2015-03-10 NOTE — Patient Instructions (Signed)
Continue Flexeril or Oxycodone as needed for severe pain.  Follow up in 2 weeks or sooner as needed.

## 2015-03-10 NOTE — Progress Notes (Signed)
Pre-visit discussion using our clinic review tool. No additional management support is needed unless otherwise documented below in the visit note.  

## 2015-03-10 NOTE — Progress Notes (Signed)
Subjective:    Patient ID: Kristina Mccoy, female    DOB: 09-05-1956, 59 y.o.   MRN: [id number]  HPI  59YO female presents for acute visit.  Having throbbing pain in mid back bilaterally over last couple of weeks. Coughing some, with dry hacking cough.  Feels short of breath chronically. No fever, chills.  Scheduled for cardiac cath next week. Stress test showed anterior ischemia. Questions if her symptoms may be related to CAD.  Some persistent symptoms of burning with urination. No fever, chills, flank pain. Completed 7 days of Cipro.   Past medical, surgical, family and social history per today's encounter.  Review of Systems  Constitutional: Negative for fever, chills, appetite change, fatigue and unexpected weight change.  Eyes: Negative for visual disturbance.  Respiratory: Positive for cough and shortness of breath. Negative for chest tightness and wheezing.   Cardiovascular: Negative for chest pain and leg swelling.  Gastrointestinal: Negative for abdominal pain, diarrhea and constipation.  Genitourinary: Positive for dysuria. Negative for urgency and frequency.  Musculoskeletal: Positive for myalgias, back pain and arthralgias.  Skin: Negative for color change and rash.  Hematological: Negative for adenopathy. Does not bruise/bleed easily.  Psychiatric/Behavioral: Negative for dysphoric mood. The patient is not nervous/anxious.        Objective:    BP 120/80 mmHg  Pulse 71  Temp(Src) 97.7 F (36.5 C) (Oral)  Resp 14  Ht 5\' 4"  (1.626 m)  Wt 189 lb 4 oz (85.843 kg)  BMI 32.47 kg/m2  SpO2 100% Physical Exam  Constitutional: She is oriented to person, place, and time. She appears well-developed and well-nourished. No distress.  HENT:  Head: Normocephalic and atraumatic.  Right Ear: External ear normal.  Left Ear: External ear normal.  Nose: Nose normal.  Mouth/Throat: Oropharynx is clear and moist. No oropharyngeal exudate.  Eyes: Conjunctivae are normal.  Pupils are equal, round, and reactive to light. Right eye exhibits no discharge. Left eye exhibits no discharge. No scleral icterus.  Neck: Normal range of motion. Neck supple. No tracheal deviation present. No thyromegaly present.  Cardiovascular: Normal rate, regular rhythm, normal heart sounds and intact distal pulses.  Exam reveals no gallop and no friction rub.   No murmur heard. Pulmonary/Chest: Effort normal and breath sounds normal. No accessory muscle usage. No respiratory distress. She has no decreased breath sounds. She has no wheezes. She has no rhonchi. She has no rales. She exhibits no tenderness.  Musculoskeletal: Normal range of motion. She exhibits no edema or tenderness.  Lymphadenopathy:    She has no cervical adenopathy.  Neurological: She is alert and oriented to person, place, and time. No cranial nerve deficit. She exhibits normal muscle tone. Coordination normal.  Skin: Skin is warm and dry. No rash noted. She is not diaphoretic. No erythema. No pallor.  Psychiatric: She has a normal mood and affect. Her behavior is normal. Judgment and thought content normal.          Assessment & Plan:   Problem List Items Addressed This Visit      Unprioritized   Anxiety    Continue prn Alprazolam      Relevant Medications   ALPRAZolam (XANAX) 0.5 MG tablet   Other Relevant Orders   Urine culture   Chronic pain disorder - Primary    Chronic pain disorder with recent worsening back pain and lateral chest wall pain. Continue prn Flexeril and Oxycodone. Follow up with pain management.      Relevant Orders  Urine culture   Cough    Cough chronic in nature. Exam normal today. CXR was normal. Likely component of airway reactivity. Will monitor for now as symptoms mild.      Relevant Orders   Urine culture   Dysuria    Some mild persistent dysuria. Will recheck UA and culture to ensure recent infection has cleared.      Relevant Orders   POCT Urinalysis Dipstick  (Completed)   Urine culture       Return in about 2 weeks (around 03/24/2015) for Recheck.

## 2015-03-10 NOTE — Assessment & Plan Note (Signed)
Some mild persistent dysuria. Will recheck UA and culture to ensure recent infection has cleared.

## 2015-03-10 NOTE — Assessment & Plan Note (Signed)
Continue prn Alprazolam 

## 2015-03-10 NOTE — Assessment & Plan Note (Signed)
Chronic pain disorder with recent worsening back pain and lateral chest wall pain. Continue prn Flexeril and Oxycodone. Follow up with pain management.

## 2015-03-10 NOTE — Assessment & Plan Note (Signed)
Cough chronic in nature. Exam normal today. CXR was normal. Likely component of airway reactivity. Will monitor for now as symptoms mild.

## 2015-03-13 ENCOUNTER — Other Ambulatory Visit: Payer: Self-pay | Admitting: *Deleted

## 2015-03-13 LAB — URINE CULTURE: Colony Count: 70000

## 2015-03-13 MED ORDER — SULFAMETHOXAZOLE-TRIMETHOPRIM 800-160 MG PO TABS: 1.0000 | ORAL_TABLET | Freq: Two times a day (BID) | ORAL | Status: DC

## 2015-03-16 ENCOUNTER — Ambulatory Visit: Payer: BLUE CROSS/BLUE SHIELD | Admitting: Endocrinology

## 2015-03-16 ENCOUNTER — Ambulatory Visit: Payer: BLUE CROSS/BLUE SHIELD | Admitting: Internal Medicine

## 2015-03-16 ENCOUNTER — Ambulatory Visit: Payer: Self-pay | Admitting: Internal Medicine

## 2015-03-20 ENCOUNTER — Encounter: Payer: Self-pay | Admitting: Internal Medicine

## 2015-03-21 ENCOUNTER — Ambulatory Visit: Payer: Self-pay | Admitting: Endocrinology

## 2015-03-21 ENCOUNTER — Ambulatory Visit: Payer: Self-pay | Admitting: Internal Medicine

## 2015-03-21 ENCOUNTER — Other Ambulatory Visit (INDEPENDENT_AMBULATORY_CARE_PROVIDER_SITE_OTHER): Payer: 59

## 2015-03-21 DIAGNOSIS — R399 Unspecified symptoms and signs involving the genitourinary system: Secondary | ICD-10-CM

## 2015-03-21 LAB — URINALYSIS, ROUTINE W REFLEX MICROSCOPIC
Bilirubin Urine: NEGATIVE
Hgb urine dipstick: NEGATIVE
Ketones, ur: NEGATIVE
Nitrite: NEGATIVE
Specific Gravity, Urine: 1.01 (ref 1.000–1.030)
Total Protein, Urine: NEGATIVE
Urine Glucose: 250 — AB
Urobilinogen, UA: 0.2 (ref 0.0–1.0)
pH: 5.5 (ref 5.0–8.0)

## 2015-03-22 ENCOUNTER — Encounter: Payer: Self-pay | Admitting: Internal Medicine

## 2015-03-22 LAB — CULTURE, URINE COMPREHENSIVE
Colony Count: NO GROWTH
Organism ID, Bacteria: NO GROWTH

## 2015-03-23 ENCOUNTER — Ambulatory Visit
Admission: EM | Admit: 2015-03-23 | Discharge: 2015-03-23 | Disposition: A | Discharge status: Home / Self Care | Payer: 59 | Attending: Internal Medicine | Admitting: Internal Medicine

## 2015-03-23 ENCOUNTER — Ambulatory Visit: Payer: Self-pay | Admitting: Internal Medicine

## 2015-03-23 ENCOUNTER — Telehealth: Payer: Self-pay | Admitting: *Deleted

## 2015-03-23 DIAGNOSIS — B370 Candidal stomatitis: Secondary | ICD-10-CM | POA: Diagnosis not present

## 2015-03-23 MED ORDER — NYSTATIN 100000 UNIT/ML MT SUSP: OROMUCOSAL | Status: DC

## 2015-03-23 NOTE — Telephone Encounter (Signed)
She should be seen in a visit.

## 2015-03-23 NOTE — ED Provider Notes (Signed)
CSN: [id number]     Arrival date & time 03/23/15  1540 History   First MD Initiated Contact with Patient 03/23/15 1608     Chief Complaint  Patient presents with  . Mouth Lesions   (Consider location/radiation/quality/duration/timing/severity/associated sxs/prior Treatment) HPI   This is a 59 year old female who presents with mouth burning after she just completed 2 courses of antibiotics for a UTI. The first course was with Cipro but she was switched to Septra because of sensitivities. Shortly afterwards she began to have notice a burning in her mouth with a coating on her tongue. This even involves the buccal surface of her lips. She is miserable with it. She has a history Sjogren's disease and has had episodes of thrush in the past. She routinely uses Biotene mouth wash for her dry mouth. She has not been using his lately because of the burning that creates.  Past Medical History  Diagnosis Date  . Otitis media, chronic   . Diabetes mellitus   . Fibromyalgia   . Breast cancer 5/14    left breast  . Lymphedema of arm     right  . Hyperlipidemia   . Heart murmur   . Diverticulosis   . Peptic ulcer    Past Surgical History  Procedure Laterality Date  . Mastectomy Bilateral 03/23/13  . Breast surgery     Family History  Problem Relation Age of Onset  . Diabetes Mother   . Heart disease Mother   . Hyperlipidemia Mother   . Heart failure Mother   . Heart disease Brother   . Hyperlipidemia Brother   . Diabetes Other   . Colon polyps     History  Substance Use Topics  . Smoking status: Former Smoker -- 0.50 packs/day    Types: Cigarettes    Quit date: 02/28/2013  . Smokeless tobacco: Never Used  . Alcohol Use: No   OB History    No data available     Review of Systems  All other systems reviewed and are negative.   Allergies  Gabapentin; Buprenorphine hcl; Methylprednisolone; Morphine and related; and Pregabalin  Home Medications   Prior to Admission  medications   Medication Sig Start Date End Date Taking? Authorizing Provider  albuterol (PROVENTIL HFA;VENTOLIN HFA) 108 (90 BASE) MCG/ACT inhaler Inhale 2 puffs into the lungs every 6 (six) hours as needed for wheezing or shortness of breath. 10/07/14  Yes Jackolyn Confer, MD  ALPRAZolam Duanne Moron) 0.5 MG tablet Take 1 tablet (0.5 mg total) by mouth 4 (four) times daily as needed for anxiety. 03/10/15  Yes Jackolyn Confer, MD  BAYER CONTOUR NEXT TEST test strip CHECK BLOOD SUGAR SIX TIMES PER DAY AS DIRECTED 09/26/14  Yes Jackolyn Confer, MD  cetirizine (ZYRTEC) 10 MG tablet Take 10 mg by mouth 2 (two) times daily.   Yes Historical Provider, MD  clobetasol ointment (TEMOVATE) 2.03 % Apply 1 application topically 2 (two) times daily.   Yes Historical Provider, MD  cyclobenzaprine (FLEXERIL) 5 MG tablet Take 1 tablet (5 mg total) by mouth 3 (three) times daily as needed for muscle spasms. 02/22/15  Yes Jackolyn Confer, MD  Emollient (CERAVE) CREA Apply topically.   Yes Historical Provider, MD  glipiZIDE (GLUCOTROL) 5 MG tablet Take 5 mg in the morning and 10 mg at evening 12/15/14  Yes Radhika P Phadke, MD  hyoscyamine (LEVSIN, ANASPAZ) 0.125 MG tablet Take 1 tablet (0.125 mg total) by mouth every 4 (four) hours as needed. 11/07/14  Yes Jackolyn Confer, MD  Insulin Glargine (LANTUS SOLOSTAR) 100 UNIT/ML Solostar Pen Inject 12 Units into the skin daily at 10 pm. 12/15/14  Yes Radhika P Phadke, MD  Insulin Pen Needle (BD PEN NEEDLE NANO U/F) 32G X 4 MM MISC Use for insulin administration once daily 12/15/14  Yes Radhika P Phadke, MD  Lancets (FREESTYLE) lancets USE TWICE A DAY 09/26/14  Yes Jackolyn Confer, MD  linagliptin (TRADJENTA) 5 MG TABS tablet Take 1 tablet (5 mg total) by mouth daily. 02/10/15  Yes Radhika P Phadke, MD  oxyCODONE (OXY IR/ROXICODONE) 5 MG immediate release tablet Take 1 tablet (5 mg total) by mouth 2 (two) times daily. As needed for sever pain. 02/22/15  Yes Jackolyn Confer, MD   pantoprazole (PROTONIX) 40 MG tablet Take 1 tablet (40 mg total) by mouth 2 (two) times daily. 08/16/14  Yes Jackolyn Confer, MD  prochlorperazine (COMPAZINE) 10 MG tablet Take 10 mg by mouth every 6 (six) hours as needed.   Yes Historical Provider, MD  tamoxifen (NOLVADEX) 20 MG tablet Take 20 mg by mouth daily.   Yes Historical Provider, MD  DULoxetine (CYMBALTA) 30 MG capsule Take 1 capsule (30 mg total) by mouth daily. 02/22/15   Jackolyn Confer, MD  nystatin (MYCOSTATIN) 100000 UNIT/ML suspension Take 5 ml QID. Retain in mouth as long as possible then swallow. Use for 48 hours after symptoms resolve. 03/23/15   Lorin Picket, PA-C  ranitidine (ZANTAC) 300 MG tablet Take 1 tablet (300 mg total) by mouth daily. 06/02/14   Jackolyn Confer, MD  sulfamethoxazole-trimethoprim (BACTRIM DS) 800-160 MG per tablet Take 1 tablet by mouth 2 (two) times daily. 03/13/15   Jackolyn Confer, MD   BP 134/90 mmHg  Pulse 74  Temp(Src) 97.5 F (36.4 C) (Oral)  Resp 16  Ht 5\' 4"  (1.626 m)  Wt 186 lb (84.369 kg)  BMI 31.91 kg/m2  SpO2 100% Physical Exam  Constitutional: She is oriented to person, place, and time. She appears well-developed and well-nourished.  HENT:  Head: Normocephalic and atraumatic.  Right Ear: External ear normal.  Left Ear: External ear normal.  Examination of the oral cavity shows erythematous mucous membranes on the buccal surfaces. Her tongue has a noticeable coating over the dorsum. She has no visible ulcerations.  Eyes: EOM are normal. Pupils are equal, round, and reactive to light.  Neck: Neck supple. No thyromegaly present.  Lymphadenopathy:    She has no cervical adenopathy.  Neurological: She is alert and oriented to person, place, and time. She has normal reflexes.  Skin: Skin is warm and dry.  Psychiatric: She has a normal mood and affect. Her behavior is normal. Judgment and thought content normal.    ED Course  Procedures (including critical care time) Labs  Review Labs Reviewed - No data to display  Imaging Review No results found.   MDM   1. Oral thrush     New Prescriptions   NYSTATIN (MYCOSTATIN) 100000 UNIT/ML SUSPENSION    Take 5 ml QID. Retain in mouth as long as possible then swallow. Use for 48 hours after symptoms resolve.   Plan: 1. Diagnosis reviewed with patient 2. rx as per orders; risks, benefits, potential side effects reviewed with patient 3. Recommend supportive treatment with Mouth rinse for hydration 4. F/u prn if symptoms worsen or don't improve   Lorin Picket, PA-C 03/23/15 1637

## 2015-03-23 NOTE — Discharge Instructions (Signed)
Thrush, Adult  Thrush, also called oral candidiasis, is a fungal infection that develops in the mouth and throat and on the tongue. It causes white patches to form on the mouth and tongue. Thrush is most common in older adults, but it can occur at any age.  Many cases of thrush are mild, but this infection can also be more serious. Thrush can be a recurring problem for people who have chronic illnesses or who take medicines that limit the body's ability to fight infection. Because these people have difficulty fighting infections, the fungus that causes thrush can spread throughout the body. This can cause life-threatening blood or organ infections. CAUSES  Thrush is usually caused by a yeast called Candida albicans. This fungus is normally present in small amounts in the mouth and on other mucous membranes. It usually causes no harm. However, when conditions are present that allow the fungus to grow uncontrolled, it invades surrounding tissues and becomes an infection. Less often, other Candida species can also lead to thrush.  RISK FACTORS Thrush is more likely to develop in the following people:  People with an impaired ability to fight infection (weakened immune system).   Older adults.   People with HIV.   People with diabetes.   People with dry mouth (xerostomia).   Pregnant women.   People with poor dental care, especially those who have false teeth.   People who use antibiotic medicines.  SIGNS AND SYMPTOMS  Thrush can be a mild infection that causes no symptoms. If symptoms develop, they may include:   A burning feeling in the mouth and throat. This can occur at the start of a thrush infection.   White patches that adhere to the mouth and tongue. The tissue around the patches may be red, raw, and painful. If rubbed (during tooth brushing, for example), the patches and the tissue of the mouth may bleed easily.   A bad taste in the mouth or difficulty tasting foods.    Cottony feeling in the mouth.   Pain during eating and swallowing. DIAGNOSIS  Your health care provider can usually diagnose thrush by looking in your mouth and asking you questions about your health.  TREATMENT  Medicines that help prevent the growth of fungi (antifungals) are the standard treatment for thrush. These medicines are either applied directly to the affected area (topical) or swallowed (oral). The treatment will depend on the severity of the condition.  Mild Thrush Mild cases of thrush may clear up with the use of an antifungal mouth rinse or lozenges. Treatment usually lasts about 14 days.  Moderate to Severe Thrush  More severe thrush infections that have spread to the esophagus are treated with an oral antifungal medicine. A topical antifungal medicine may also be used.   For some severe infections, a treatment period longer than 14 days may be needed.   Oral antifungal medicines are almost never used during pregnancy because the fetus may be harmed. However, if a pregnant woman has a rare, severe thrush infection that has spread to her blood, oral antifungal medicines may be used. In this case, the risk of harm to the mother and fetus from the severe thrush infection may be greater than the risk posed by the use of antifungal medicines.  Persistent or Recurrent Thrush For cases of thrush that do not go away or keep coming back, treatment may involve the following:   Treatment may be needed twice as long as the symptoms last.   Treatment will   include both oral and topical antifungal medicines.   People with weakened immune systems can take an antifungal medicine on a continuous basis to prevent thrush infections.  It is important to treat conditions that make you more likely to get thrush, such as diabetes or HIV.  HOME CARE INSTRUCTIONS   Only take over-the-counter or prescription medicine as directed by your health care provider. Talk to your health care  provider about an over-the-counter medicine called gentian violet, which kills bacteria and fungi.   Eat plain, unflavored yogurt as directed by your health care provider. Check the label to make sure the yogurt contains live cultures. This yogurt can help healthy bacteria grow in the mouth that can stop the growth of the fungus that causes thrush.   Try these measures to help reduce the discomfort of thrush:   Drink cold liquids such as water or iced tea.   Try flavored ice treats or frozen juices.   Eat foods that are easy to swallow, such as gelatin, ice cream, or custard.   If the patches in your mouth are painful, try drinking from a straw.   Rinse your mouth several times a day with a warm saltwater rinse. You can make the saltwater mixture with 1 tsp (6 g) of salt in 8 fl oz (0.2 L) of warm water.   If you wear dentures, remove the dentures before going to bed, brush them vigorously, and soak them in a cleaning solution as directed by your health care provider.   Women who are breastfeeding should clean their nipples with an antifungal medicine as directed by their health care provider. Dry the nipples after breastfeeding. Applying lanolin-containing body lotion may help relieve nipple soreness.  SEEK MEDICAL CARE IF:  Your symptoms are getting worse or are not improving within 7 days of starting treatment.   You have symptoms of spreading infection, such as white patches on the skin outside of the mouth.   You are nursing and you have redness, burning, or pain in the nipples that is not relieved with treatment.  MAKE SURE YOU:  Understand these instructions.  Will watch your condition.  Will get help right away if you are not doing well or get worse. Document Released: 06/11/2004 Document Revised: 07/07/2013 Document Reviewed: 04/19/2013 [employer] Patient Information 2015 [employer]. This information is not intended to replace advice given to you by your  health care provider. Make sure you discuss any questions you have with your health care provider.  

## 2015-03-23 NOTE — ED Notes (Signed)
Pt states " Pt states my mouth became sore yesterday. I completed two antibiotics for uti, now I am mouth soreness. I think it's thrush."

## 2015-03-23 NOTE — Telephone Encounter (Signed)
Pt called states she believes she has thrush in her mouth.  Pt states her mouth is burning and has a yellow film on her tongue.  Pt is requesting a Rx be called in.  Please advise

## 2015-03-23 NOTE — Telephone Encounter (Signed)
Spoke with pt, advised she should be seen, suggested an UC or Acute Care.  Pt verbalized understanding

## 2015-03-27 ENCOUNTER — Ambulatory Visit: Payer: Self-pay | Admitting: Internal Medicine

## 2015-03-27 ENCOUNTER — Ambulatory Visit (INDEPENDENT_AMBULATORY_CARE_PROVIDER_SITE_OTHER): Payer: 59 | Admitting: Internal Medicine

## 2015-03-27 ENCOUNTER — Encounter: Payer: Self-pay | Admitting: Internal Medicine

## 2015-03-27 ENCOUNTER — Telehealth: Payer: Self-pay | Admitting: *Deleted

## 2015-03-27 ENCOUNTER — Ambulatory Visit
Admission: RE | Admit: 2015-03-27 | Discharge: 2015-03-27 | Disposition: A | Discharge status: Home / Self Care | Payer: Commercial Managed Care - HMO | Source: Ambulatory Visit | Attending: Internal Medicine | Admitting: Internal Medicine

## 2015-03-27 VITALS — BP 116/78 | HR 75 | Temp 97.7°F | Ht 64.0 in | Wt 189.4 lb

## 2015-03-27 DIAGNOSIS — R109 Unspecified abdominal pain: Secondary | ICD-10-CM

## 2015-03-27 DIAGNOSIS — B370 Candidal stomatitis: Secondary | ICD-10-CM | POA: Insufficient documentation

## 2015-03-27 DIAGNOSIS — G894 Chronic pain syndrome: Secondary | ICD-10-CM | POA: Diagnosis not present

## 2015-03-27 DIAGNOSIS — K5790 Diverticulosis of intestine, part unspecified, without perforation or abscess without bleeding: Secondary | ICD-10-CM | POA: Diagnosis not present

## 2015-03-27 DIAGNOSIS — R300 Dysuria: Secondary | ICD-10-CM | POA: Diagnosis not present

## 2015-03-27 DIAGNOSIS — I7090 Unspecified atherosclerosis: Secondary | ICD-10-CM | POA: Diagnosis not present

## 2015-03-27 LAB — POCT URINALYSIS DIPSTICK
Bilirubin, UA: NEGATIVE
Blood, UA: NEGATIVE
Glucose, UA: 500
Ketones, UA: NEGATIVE
Leukocytes, UA: NEGATIVE
Nitrite, UA: NEGATIVE
Protein, UA: NEGATIVE
Spec Grav, UA: <=1.005
Urobilinogen, UA: 0.2
pH, UA: 5.5

## 2015-03-27 MED ORDER — OXYCODONE HCL 5 MG PO TABS: 5.0000 mg | ORAL_TABLET | Freq: Two times a day (BID) | ORAL | Status: DC

## 2015-03-27 MED ORDER — FLUCONAZOLE 150 MG PO TABS: 150.0000 mg | ORAL_TABLET | Freq: Once | ORAL | Status: DC

## 2015-03-27 MED ORDER — NYSTATIN 100000 UNIT/ML MT SUSP
OROMUCOSAL | Status: DC
Start: 2015-03-27 — End: 2015-06-16

## 2015-03-27 NOTE — Patient Instructions (Addendum)
We will get a CT of the abdomen to look for kidney stone today.  Continue Nystatin for 10 days to treat thrush.  Take Diflucan 150mg  x1.  We will repeat urinalysis and culture today.

## 2015-03-27 NOTE — Progress Notes (Signed)
Pre visit review using our clinic review tool, if applicable. No additional management support is needed unless otherwise documented below in the visit note. 

## 2015-03-27 NOTE — Assessment & Plan Note (Signed)
Encouraged her to follow up with pain management for chronic management of narcotics. Refill given on Oxycodone today.

## 2015-03-27 NOTE — Assessment & Plan Note (Signed)
Persistent dysuria. Suspect candidal infection contributing. Will treat with Diflucan 150mg  po x 1.

## 2015-03-27 NOTE — Assessment & Plan Note (Signed)
Right flank pain x several weeks off and. Symptoms concerning for nephrolithiasis. Will get CT without contrast for evaluation Repeat UA and culture today. Oxycodone prn severe pain. Follow up after imaging complete.

## 2015-03-27 NOTE — Addendum Note (Signed)
Addended by: Karlene Einstein D on: 03/27/2015 02:07 PM   Modules accepted: Orders

## 2015-03-27 NOTE — Assessment & Plan Note (Signed)
Thrush improving but not resolved. Will continue Nystatin x 10 days. Follow up prn if symptoms are not improving.

## 2015-03-27 NOTE — Telephone Encounter (Signed)
I have already sent her a MyChart message with her results. Can you please call her to let her know there were no acute findings to explain pain? She should continue Oxycodone 5mg  po bidprn.

## 2015-03-27 NOTE — Telephone Encounter (Signed)
Notified pt. 

## 2015-03-27 NOTE — Progress Notes (Signed)
Subjective:    Patient ID: Kristina Mccoy, female    DOB: [birth year], 59 y.o.   MRN: [id number]  HPI 59YO female presents for acute visit.  Thrush - Seen at urgent care for thrush. Treated with nystatin, but ran out of medication.  UTI - Recent urinary culture on 6/21 was negative. However, continues to have symptoms of UTI. Has burning both with urination and just sitting. Also has burning on hands and feet.  Continues to have right mid back pain. Ongoing for several weeks off and on. Described as "stabbing" at times. Taking Oxycodone up to twice daily for severe chronic and acute pain. Moderate improvement with this. Evaluation at pain management still pending.  Past medical, surgical, family and social history per today's encounter.  Review of Systems  Constitutional: Negative for fever, chills, appetite change, fatigue and unexpected weight change.  HENT: Positive for sore throat. Negative for trouble swallowing and voice change.   Eyes: Negative for visual disturbance.  Respiratory: Negative for shortness of breath.   Cardiovascular: Negative for chest pain and leg swelling.  Gastrointestinal: Negative for abdominal pain, diarrhea and constipation.  Genitourinary: Positive for dysuria and flank pain. Negative for urgency, frequency, hematuria, vaginal bleeding, vaginal discharge, genital sores, vaginal pain and pelvic pain.  Musculoskeletal: Positive for myalgias, back pain and arthralgias.  Skin: Negative for color change and rash.  Hematological: Negative for adenopathy. Does not bruise/bleed easily.  Psychiatric/Behavioral: Negative for suicidal ideas, sleep disturbance and dysphoric mood. The patient is not nervous/anxious.        Objective:    BP 116/78 mmHg  Pulse 75  Temp(Src) 97.7 F (36.5 C) (Oral)  Ht 5\' 4"  (1.626 m)  Wt 189 lb 6 oz (85.9 kg)  BMI 32.49 kg/m2  SpO2 100% Physical Exam  Constitutional: She is oriented to person, place, and time. She appears  well-developed and well-nourished. No distress.  HENT:  Head: Normocephalic and atraumatic.  Right Ear: External ear normal.  Left Ear: External ear normal.  Nose: Nose normal.  Mouth/Throat: Posterior oropharyngeal erythema present. No oropharyngeal exudate.  Eyes: Conjunctivae are normal. Pupils are equal, round, and reactive to light. Right eye exhibits no discharge. Left eye exhibits no discharge. No scleral icterus.  Neck: Normal range of motion. Neck supple. No tracheal deviation present. No thyromegaly present.  Cardiovascular: Normal rate, regular rhythm, normal heart sounds and intact distal pulses.  Exam reveals no gallop and no friction rub.   No murmur heard. Pulmonary/Chest: Effort normal and breath sounds normal. No respiratory distress. She has no wheezes. She has no rales. She exhibits no tenderness.  Abdominal: There is tenderness (right flank).  Musculoskeletal: Normal range of motion. She exhibits no edema or tenderness.  Lymphadenopathy:    She has no cervical adenopathy.  Neurological: She is alert and oriented to person, place, and time. No cranial nerve deficit. She exhibits normal muscle tone. Coordination normal.  Skin: Skin is warm and dry. No rash noted. She is not diaphoretic. No erythema. No pallor.  Psychiatric: She has a normal mood and affect. Her behavior is normal. Judgment and thought content normal.          Assessment & Plan:   Problem List Items Addressed This Visit      Unprioritized   Chronic pain disorder    Encouraged her to follow up with pain management for chronic management of narcotics. Refill given on Oxycodone today.      Dysuria    Persistent dysuria. Suspect candidal  infection contributing. Will treat with Diflucan 150mg  po x 1.      Relevant Orders   POCT Urinalysis Dipstick (Completed)   Right flank pain - Primary    Right flank pain x several weeks off and. Symptoms concerning for nephrolithiasis. Will get CT without  contrast for evaluation Repeat UA and culture today. Oxycodone prn severe pain. Follow up after imaging complete.      Relevant Orders   CT Abdomen Pelvis Wo Contrast   Thrush    Thrush improving but not resolved. Will continue Nystatin x 10 days. Follow up prn if symptoms are not improving.      Relevant Medications   nystatin (MYCOSTATIN) 100000 UNIT/ML suspension   fluconazole (DIFLUCAN) 150 MG tablet       Return in about 4 weeks (around 04/24/2015) for Recheck.

## 2015-03-27 NOTE — Telephone Encounter (Signed)
Ben called to report CT as:  1) no Urinary Tract  Calculi of Hydronephrosis 2) Normal appendix w/o other explanation of Right sided pain 3) Athrosclerosis 4) Pelvic floor laxity.  Further states pt has gone home.  Please advise

## 2015-03-30 LAB — URINE CULTURE: Colony Count: 25000

## 2015-04-06 ENCOUNTER — Encounter: Payer: Self-pay | Admitting: Internal Medicine

## 2015-04-18 ENCOUNTER — Ambulatory Visit (INDEPENDENT_AMBULATORY_CARE_PROVIDER_SITE_OTHER): Payer: 59 | Admitting: Internal Medicine

## 2015-04-18 ENCOUNTER — Ambulatory Visit: Payer: Self-pay | Admitting: Internal Medicine

## 2015-04-18 ENCOUNTER — Encounter: Payer: Self-pay | Admitting: Internal Medicine

## 2015-04-18 VITALS — BP 132/81 | HR 72 | Temp 97.8°F | Ht 64.0 in | Wt 191.4 lb

## 2015-04-18 DIAGNOSIS — R7989 Other specified abnormal findings of blood chemistry: Secondary | ICD-10-CM

## 2015-04-18 DIAGNOSIS — R059 Cough, unspecified: Secondary | ICD-10-CM

## 2015-04-18 DIAGNOSIS — R05 Cough: Secondary | ICD-10-CM | POA: Diagnosis not present

## 2015-04-18 DIAGNOSIS — IMO0002 Reserved for concepts with insufficient information to code with codable children: Secondary | ICD-10-CM

## 2015-04-18 DIAGNOSIS — E1165 Type 2 diabetes mellitus with hyperglycemia: Secondary | ICD-10-CM

## 2015-04-18 DIAGNOSIS — M3500 Sicca syndrome, unspecified: Secondary | ICD-10-CM

## 2015-04-18 DIAGNOSIS — G894 Chronic pain syndrome: Secondary | ICD-10-CM

## 2015-04-18 LAB — COMPREHENSIVE METABOLIC PANEL
ALT: 38 U/L — ABNORMAL HIGH (ref 0–35)
AST: 29 U/L (ref 0–37)
Albumin: 3.8 g/dL (ref 3.5–5.2)
Alkaline Phosphatase: 104 U/L (ref 39–117)
BUN: 13 mg/dL (ref 6–23)
CO2: 26 mEq/L (ref 19–32)
Calcium: 9.1 mg/dL (ref 8.4–10.5)
Chloride: 98 mEq/L (ref 96–112)
Creatinine, Ser: 0.8 mg/dL (ref 0.40–1.20)
GFR: 78.51 mL/min (ref >60.00)
Glucose, Bld: 302 mg/dL — ABNORMAL HIGH (ref 70–99)
Potassium: 4.2 mEq/L (ref 3.5–5.1)
Sodium: 131 mEq/L — ABNORMAL LOW (ref 135–145)
Total Bilirubin: 0.2 mg/dL (ref 0.2–1.2)
Total Protein: 7.2 g/dL (ref 6.0–8.3)

## 2015-04-18 LAB — LIPID PANEL
Cholesterol: 188 mg/dL (ref 0–200)
HDL: 39.4 mg/dL (ref >39.00)
NonHDL: 148.6
Total CHOL/HDL Ratio: 5
Triglycerides: 274 mg/dL — ABNORMAL HIGH (ref 0.0–149.0)
VLDL: 54.8 mg/dL — ABNORMAL HIGH (ref 0.0–40.0)

## 2015-04-18 LAB — MICROALBUMIN / CREATININE URINE RATIO
Creatinine,U: 13.1 mg/dL
Microalb Creat Ratio: 5.4 mg/g (ref 0.0–30.0)
Microalb, Ur: <0.7 mg/dL (ref 0.0–1.9)

## 2015-04-18 LAB — HEMOGLOBIN A1C: Hgb A1c MFr Bld: 8.1 % — ABNORMAL HIGH (ref 4.6–6.5)

## 2015-04-18 LAB — LDL CHOLESTEROL, DIRECT: Direct LDL: 111 mg/dL

## 2015-04-18 NOTE — Assessment & Plan Note (Signed)
Will check A1c with labs today. Continue current medication.

## 2015-04-18 NOTE — Progress Notes (Signed)
Subjective:    Patient ID: Kristina Mccoy, female    DOB: 02-05-1956, 59 y.o.   MRN: [id number]  HPI  59YO female presents for follow up.  DM - BG generally well controlled. No BG over 200.  Had 2x recent UTI. Stopped taking Senaida Lange because of concern over this causing UTI.  Over the last month, notes increased congestion, headache, sore throat. Frequently clearing throat.   Past medical, surgical, family and social history per today's encounter.  Review of Systems  Constitutional: Positive for fatigue. Negative for fever, chills, appetite change and unexpected weight change.  HENT: Positive for congestion, postnasal drip and rhinorrhea. Negative for sinus pressure, sore throat, trouble swallowing and voice change.   Eyes: Negative for visual disturbance.  Respiratory: Positive for cough and shortness of breath.   Cardiovascular: Negative for chest pain and leg swelling.  Gastrointestinal: Negative for abdominal pain.  Musculoskeletal: Positive for myalgias, back pain and arthralgias.  Skin: Negative for color change and rash.  Hematological: Negative for adenopathy. Does not bruise/bleed easily.  Psychiatric/Behavioral: Negative for dysphoric mood. The patient is not nervous/anxious.        Objective:    BP 132/81 mmHg  Pulse 72  Temp(Src) 97.8 F (36.6 C) (Oral)  Ht 5\' 4"  (1.626 m)  Wt 191 lb 6 oz (86.807 kg)  BMI 32.83 kg/m2  SpO2 100% Physical Exam  Constitutional: She is oriented to person, place, and time. She appears well-developed and well-nourished. No distress.  HENT:  Head: Normocephalic and atraumatic.  Right Ear: External ear normal.  Left Ear: External ear normal.  Nose: Nose normal.  Mouth/Throat: Oropharynx is clear and moist. No oropharyngeal exudate.  Eyes: Conjunctivae are normal. Pupils are equal, round, and reactive to light. Right eye exhibits no discharge. Left eye exhibits no discharge. No scleral icterus.  Neck: Normal range of motion.  Neck supple. No tracheal deviation present. No thyromegaly present.  Cardiovascular: Normal rate, regular rhythm, normal heart sounds and intact distal pulses.  Exam reveals no gallop and no friction rub.   No murmur heard. Pulmonary/Chest: Effort normal and breath sounds normal. No respiratory distress. She has no wheezes. She has no rales. She exhibits no tenderness.  Musculoskeletal: Normal range of motion. She exhibits no edema or tenderness.  Lymphadenopathy:    She has no cervical adenopathy.  Neurological: She is alert and oriented to person, place, and time. No cranial nerve deficit. She exhibits normal muscle tone. Coordination normal.  Skin: Skin is warm and dry. No rash noted. She is not diaphoretic. No erythema. No pallor.  Psychiatric: She has a normal mood and affect. Her behavior is normal. Judgment and thought content normal.          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 Pain management clinic was out of network. She is searching for another provider. She is on max dose of Ibuprofen. Intolerant of Gabapentin and Lyrica. Continue prn Oxycycone.      Relevant Orders   Comprehensive metabolic panel   Hemoglobin A1c   Lipid panel   Microalbumin / creatinine urine ratio   Cough    Chronic mild dyspnea and cough. Recent cardiac cath was normal. 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 Likely seasonal allergies also contributing. Will add Benadryl prn at night and Claritin in the morning.      Diabetes type 2, uncontrolled -  Primary    Will check A1c with labs today. Continue current medication.      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 Will follow.          Return in about 3 months (around 07/19/2015) for Recheck of Diabetes.

## 2015-04-18 NOTE — Assessment & Plan Note (Signed)
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 Pain management clinic was out of network. She is searching for another provider. She is on max dose of Ibuprofen. Intolerant of Gabapentin and Lyrica. Continue prn Oxycycone.

## 2015-04-18 NOTE — Assessment & Plan Note (Signed)
Chronic mild dyspnea and cough. Recent cardiac cath was normal. 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 Likely seasonal allergies also contributing. Will add Benadryl prn at night and Claritin in the morning.

## 2015-04-18 NOTE — Progress Notes (Signed)
Pre visit review using our clinic review tool, if applicable. No additional management support is needed unless otherwise documented below in the visit note. 

## 2015-04-18 NOTE — Assessment & Plan Note (Signed)
Started on Plaquenil by rheumatology. Will follow.

## 2015-04-18 NOTE — Patient Instructions (Addendum)
Labs today.   Follow up in 3 months.  

## 2015-04-19 ENCOUNTER — Encounter: Payer: Self-pay | Admitting: Internal Medicine

## 2015-05-08 ENCOUNTER — Other Ambulatory Visit: Payer: Self-pay

## 2015-05-08 DIAGNOSIS — R1013 Epigastric pain: Secondary | ICD-10-CM

## 2015-05-08 MED ORDER — PANTOPRAZOLE SODIUM 40 MG PO TBEC: 40.0000 mg | DELAYED_RELEASE_TABLET | Freq: Two times a day (BID) | ORAL | Status: DC

## 2015-05-22 ENCOUNTER — Encounter: Payer: Self-pay | Admitting: Internal Medicine

## 2015-05-23 NOTE — Telephone Encounter (Signed)
Pt was seen by [employer]PS in May. Pt was advise that she needed a Medical Psych Eval. Pt has not followed up with CPS since visit in May.

## 2015-05-23 NOTE — Telephone Encounter (Signed)
Called [facility]. They never received the referral in July that was faxed. I have refaxed to [phone]. Pt is aware that she will receive a call from them on Monday or Tuesday once Dr. Humphrey Rolls reviews her notes that was faxed.

## 2015-05-26 ENCOUNTER — Other Ambulatory Visit: Payer: Self-pay | Admitting: Internal Medicine

## 2015-05-26 NOTE — Telephone Encounter (Signed)
Last OV 7/16 ok to fill?

## 2015-06-06 ENCOUNTER — Encounter: Payer: Self-pay | Admitting: Emergency Medicine

## 2015-06-06 ENCOUNTER — Ambulatory Visit
Admission: EM | Admit: 2015-06-06 | Discharge: 2015-06-06 | Disposition: A | Discharge status: Home / Self Care | Payer: 59 | Attending: Family Medicine | Admitting: Family Medicine

## 2015-06-06 DIAGNOSIS — J069 Acute upper respiratory infection, unspecified: Secondary | ICD-10-CM | POA: Diagnosis not present

## 2015-06-06 HISTORY — DX: Sjogren syndrome, unspecified: M35.00

## 2015-06-06 LAB — RAPID STREP SCREEN (MED CTR MEBANE ONLY): Streptococcus, Group A Screen (Direct): NEGATIVE

## 2015-06-06 MED ORDER — GUAIFENESIN 400 MG PO TABS: 400.0000 mg | ORAL_TABLET | ORAL | Status: DC

## 2015-06-06 NOTE — Discharge Instructions (Signed)
Use Ibuprofen or tylenol as directed Follow up with Dr Gilford Rile or here in 5-7 days if no better or sooner if worse  Cough, Adult  A cough is a reflex. It helps you clear your throat and airways. A cough can help heal your body. A cough can last 2 or 3 weeks (acute) or may last more than 8 weeks (chronic). Some common causes of a cough can include an infection, allergy, or a cold. HOME CARE  Only take medicine as told by your doctor.  If given, take your medicines (antibiotics) as told. Finish them even if you start to feel better.  Use a cold steam vaporizer or humidifier in your home. This can help loosen thick spit (secretions).  Sleep so you are almost sitting up (semi-upright). Use pillows to do this. This helps reduce coughing.  Rest as needed.  Stop smoking if you smoke. GET HELP RIGHT AWAY IF:  You have yellowish-white fluid (pus) in your thick spit.  Your cough gets worse.  Your medicine does not reduce coughing, and you are losing sleep.  You cough up blood.  You have trouble breathing.  Your pain gets worse and medicine does not help.  You have a fever. MAKE SURE YOU:   Understand these instructions.  Will watch your condition.  Will get help right away if you are not doing well or get worse. Document Released: 05/30/2011 Document Revised: 01/31/2014 Document Reviewed: 05/30/2011 [facility] Patient Information 2015 [place], [place]. This information is not intended to replace advice given to you by your health care provider. Make sure you discuss any questions you have with your health care provider.  Upper Respiratory Infection, Adult An upper respiratory infection (URI) is also sometimes known as the common cold. The upper respiratory tract includes the nose, sinuses, throat, trachea, and bronchi. Bronchi are the airways leading to the lungs. Most people improve within 1 week, but symptoms can last up to 2 weeks. A residual cough may last even longer.   CAUSES Many different viruses can infect the tissues lining the upper respiratory tract. The tissues become irritated and inflamed and often become very moist. Mucus production is also common. A cold is contagious. You can easily spread the virus to others by oral contact. This includes kissing, sharing a glass, coughing, or sneezing. Touching your mouth or nose and then touching a surface, which is then touched by another person, can also spread the virus. SYMPTOMS  Symptoms typically develop 1 to 3 days after you come in contact with a cold virus. Symptoms vary from person to person. They may include:  Runny nose.  Sneezing.  Nasal congestion.  Sinus irritation.  Sore throat.  Loss of voice (laryngitis).  Cough.  Fatigue.  Muscle aches.  Loss of appetite.  Headache.  Low-grade fever. DIAGNOSIS  You might diagnose your own cold based on familiar symptoms, since most people get a cold 2 to 3 times a year. Your caregiver can confirm this based on your exam. Most importantly, your caregiver can check that your symptoms are not due to another disease such as strep throat, sinusitis, pneumonia, asthma, or epiglottitis. Blood tests, throat tests, and X-rays are not necessary to diagnose a common cold, but they may sometimes be helpful in excluding other more serious diseases. Your caregiver will decide if any further tests are required. RISKS AND COMPLICATIONS  You may be at risk for a more severe case of the common cold if you smoke cigarettes, have chronic heart disease (such  as heart failure) or lung disease (such as asthma), or if you have a weakened immune system. The very young and very old are also at risk for more serious infections. Bacterial sinusitis, middle ear infections, and bacterial pneumonia can complicate the common cold. The common cold can worsen asthma and chronic obstructive pulmonary disease (COPD). Sometimes, these complications can require emergency medical care  and may be life-threatening. PREVENTION  The best way to protect against getting a cold is to practice good hygiene. Avoid oral or hand contact with people with cold symptoms. Wash your hands often if contact occurs. There is no clear evidence that vitamin C, vitamin E, echinacea, or exercise reduces the chance of developing a cold. However, it is always recommended to get plenty of rest and practice good nutrition. TREATMENT  Treatment is directed at relieving symptoms. There is no cure. Antibiotics are not effective, because the infection is caused by a virus, not by bacteria. Treatment may include:  Increased fluid intake. Sports drinks offer valuable electrolytes, sugars, and fluids.  Breathing heated mist or steam (vaporizer or shower).  Eating chicken soup or other clear broths, and maintaining good nutrition.  Getting plenty of rest.  Using gargles or lozenges for comfort.  Controlling fevers with ibuprofen or acetaminophen as directed by your caregiver.  Increasing usage of your inhaler if you have asthma. Zinc gel and zinc lozenges, taken in the first 24 hours of the common cold, can shorten the duration and lessen the severity of symptoms. Pain medicines may help with fever, muscle aches, and throat pain. A variety of non-prescription medicines are available to treat congestion and runny nose. Your caregiver can make recommendations and may suggest nasal or lung inhalers for other symptoms.  HOME CARE INSTRUCTIONS   Only take over-the-counter or prescription medicines for pain, discomfort, or fever as directed by your caregiver.  Use a warm mist humidifier or inhale steam from a shower to increase air moisture. This may keep secretions moist and make it easier to breathe.  Drink enough water and fluids to keep your urine clear or pale yellow.  Rest as needed.  Return to work when your temperature has returned to normal or as your caregiver advises. You may need to stay home  longer to avoid infecting others. You can also use a face mask and careful hand washing to prevent spread of the virus. SEEK MEDICAL CARE IF:   After the first few days, you feel you are getting worse rather than better.  You need your caregiver's advice about medicines to control symptoms.  You develop chills, worsening shortness of breath, or brown or red sputum. These may be signs of pneumonia.  You develop yellow or brown nasal discharge or pain in the face, especially when you bend forward. These may be signs of sinusitis.  You develop a fever, swollen neck glands, pain with swallowing, or white areas in the back of your throat. These may be signs of strep throat. SEEK IMMEDIATE MEDICAL CARE IF:   You have a fever.  You develop severe or persistent headache, ear pain, sinus pain, or chest pain.  You develop wheezing, a prolonged cough, cough up blood, or have a change in your usual mucus (if you have chronic lung disease).  You develop sore muscles or a stiff neck. Document Released: 03/12/2001 Document Revised: 12/09/2011 Document Reviewed: 12/22/2013 [facility] Patient Information 2015 [place], [place]. This information is not intended to replace advice given to you by your health care provider. Make  sure you discuss any questions you have with your health care provider.

## 2015-06-06 NOTE — ED Provider Notes (Signed)
CSN: [id number]     Arrival date & time 06/06/15  0734 History   First MD Initiated Contact with Patient 06/06/15 0809     Chief Complaint  Patient presents with  . Sore Throat  . Cough   PCP:  Dr Ronette Deter  HPI  Kristina Mccoy is a pleasant 59 y.o. female that reports 24 hours of sore throat, bilateral ear pressure, headache, diaphoresis, chills, & cough.  She reports cough with small amounts of whitish phlegm & feels like she can taste infection.  Denies SOB.  Denies fever.  Pain 6/10.  Took advil  600mg  this AM.  Advil is not helping.  She has gone through a bag of cough drops as well.  Denies nausea, vomiting or rash. She is diabetic & reports blood sugars fairly well controlled.      Past Medical History  Diagnosis Date  . Otitis media, chronic   . Diabetes mellitus   . Fibromyalgia   . Breast cancer 5/14    left breast  . Lymphedema of arm     right  . Hyperlipidemia   . Heart murmur   . Diverticulosis   . Peptic ulcer   . Sjogren's disease   . Breast cancer 02/2013    Dr Laurance Flatten ([facility])   Past Surgical History  Procedure Laterality Date  . Mastectomy Bilateral 03/23/13  . Breast surgery     Family History  Problem Relation Age of Onset  . Diabetes Mother   . Heart disease Mother   . Hyperlipidemia Mother   . Heart failure Mother   . Heart disease Brother   . Hyperlipidemia Brother   . Diabetes Other   . Colon polyps     Social History  Substance Use Topics  . Smoking status: Former Smoker -- 0.50 packs/day    Types: Cigarettes    Quit date: 02/28/2013  . Smokeless tobacco: Never Used  . Alcohol Use: No   OB History    No data available     Review of Systems  Constitutional: Positive for chills, diaphoresis, appetite change and fatigue. Negative for fever and activity change.  HENT: Positive for congestion, ear pain, postnasal drip, rhinorrhea, sinus pressure, sneezing and sore throat. Negative for ear discharge, facial swelling, hearing  loss, mouth sores and tinnitus.   Eyes: Negative.   Respiratory: Positive for cough. Negative for apnea, choking, chest tightness, shortness of breath and wheezing.   Cardiovascular: Negative.   Gastrointestinal: Negative.   Endocrine: Negative.   Genitourinary: Negative.   Skin: Negative.   Neurological: Negative.     Allergies  Gabapentin; Buprenorphine hcl; Methylprednisolone; Morphine and related; and Pregabalin  Home Medications   Prior to Admission medications   Medication Sig Start Date End Date Taking? Authorizing Provider  albuterol (PROVENTIL HFA;VENTOLIN HFA) 108 (90 BASE) MCG/ACT inhaler Inhale 2 puffs into the lungs every 6 (six) hours as needed for wheezing or shortness of breath. 10/07/14   Jackolyn Confer, MD  ALPRAZolam Duanne Moron) 0.5 MG tablet TAKE ONE TABLET FOUR TIMES DAILY AS NEEDED FOR ANXIETY 05/26/15   Jackolyn Confer, MD  BAYER CONTOUR NEXT TEST test strip CHECK BLOOD SUGAR SIX TIMES PER DAY AS DIRECTED 09/26/14   Jackolyn Confer, MD  cetirizine (ZYRTEC) 10 MG tablet Take 10 mg by mouth 2 (two) times daily.    Historical Provider, MD  clobetasol ointment (TEMOVATE) 2.09 % Apply 1 application topically 2 (two) times daily.    Historical Provider, MD  cyclobenzaprine (FLEXERIL)  5 MG tablet Take 1 tablet (5 mg total) by mouth 3 (three) times daily as needed for muscle spasms. 02/22/15   Jackolyn Confer, MD  DULoxetine (CYMBALTA) 30 MG capsule Take 1 capsule (30 mg total) by mouth daily. 02/22/15   Jackolyn Confer, MD  Emollient (CERAVE) CREA Apply topically.    Historical Provider, MD  glipiZIDE (GLUCOTROL) 5 MG tablet Take 5 mg in the morning and 10 mg at evening 12/15/14   Radhika P Phadke, MD  guaifenesin (HUMIBID E) 400 MG TABS tablet Take 1 tablet (400 mg total) by mouth every 4 (four) hours. 06/06/15   Andria Meuse, NP  hyoscyamine (LEVSIN, ANASPAZ) 0.125 MG tablet Take 1 tablet (0.125 mg total) by mouth every 4 (four) hours as needed. 11/07/14   Jackolyn Confer, MD  Insulin Glargine (LANTUS SOLOSTAR) 100 UNIT/ML Solostar Pen Inject 12 Units into the skin daily at 10 pm. 12/15/14   Haydee Monica, MD  Lancets (FREESTYLE) lancets USE TWICE A DAY 09/26/14   Jackolyn Confer, MD  nystatin (MYCOSTATIN) 100000 UNIT/ML suspension Take 5 ml QID. Retain in mouth as long as possible then swallow. Use for 48 hours after symptoms resolve. Patient not taking: Reported on 06/06/2015 03/27/15   Jackolyn Confer, MD  oxyCODONE (OXY IR/ROXICODONE) 5 MG immediate release tablet Take 1 tablet (5 mg total) by mouth 2 (two) times daily. As needed for sever pain. 03/27/15   Jackolyn Confer, MD  pantoprazole (PROTONIX) 40 MG tablet Take 1 tablet (40 mg total) by mouth 2 (two) times daily. 05/08/15   Jackolyn Confer, MD  prochlorperazine (COMPAZINE) 10 MG tablet Take 10 mg by mouth every 6 (six) hours as needed.    Historical Provider, MD  ranitidine (ZANTAC) 300 MG tablet Take 1 tablet (300 mg total) by mouth daily. 06/02/14   Jackolyn Confer, MD  tamoxifen (NOLVADEX) 20 MG tablet Take 20 mg by mouth daily.    Historical Provider, MD   Meds Ordered and Administered this Visit  Medications - No data to display  BP 119/75 mmHg  Pulse 85  Temp(Src) 96.7 F (35.9 C) (Tympanic)  Resp 16  Ht 5\' 4"  (1.626 m)  Wt 190 lb (86.183 kg)  BMI 32.60 kg/m2  SpO2 97% No data found.   Physical Exam  Constitutional: She is oriented to person, place, and time. She appears well-developed and well-nourished. No distress.  HENT:  Head: Normocephalic.  Right Ear: Hearing, tympanic membrane and external ear normal. No drainage.  Left Ear: Hearing, tympanic membrane and external ear normal. No drainage.  Nose: Mucosal edema and rhinorrhea present. No sinus tenderness. Right sinus exhibits no maxillary sinus tenderness and no frontal sinus tenderness. Left sinus exhibits no maxillary sinus tenderness and no frontal sinus tenderness.  Mouth/Throat: Uvula is midline, oropharynx is  clear and moist and mucous membranes are normal. No oropharyngeal exudate, posterior oropharyngeal edema, posterior oropharyngeal erythema or tonsillar abscesses.  Eyes: Conjunctivae are normal. No scleral icterus.  Neck: Normal range of motion. Neck supple.  Cardiovascular: Normal rate, regular rhythm and normal heart sounds.  Exam reveals no friction rub.   No murmur heard. Pulmonary/Chest: Breath sounds normal. No respiratory distress. She has no wheezes. She has no rales.  Abdominal: Soft. She exhibits no distension. There is no tenderness. There is no rebound and no guarding.  Musculoskeletal: Normal range of motion. She exhibits no edema.  Neurological: She is alert and oriented to person, place, and time.  No cranial nerve deficit. Coordination normal.  Skin: Skin is warm and dry. No rash noted. She is not diaphoretic. No erythema.  Psychiatric: She has a normal mood and affect. Her behavior is normal. Judgment and thought content normal.  Nursing note and vitals reviewed.   ED Course  Procedures   Labs Review Labs Reviewed  RAPID STREP SCREEN (NOT AT [facility])  CULTURE, GROUP A STREP ([facility] ONLY)   MDM   1. Acute URI    Likely viral.  Strep negative.  Discussed warning signs for patient to return to clinic including worsening cough, SOB, fever or any new symptoms.  She agrees with plan.    Plan: 1. Test results and diagnosis reviewed with patient 2. rx as per orders; risks, benefits, potential side effects reviewed with patient 3. Recommend supportive treatment with rest, ibuprofen or tylenol PRN 4. F/u prn if symptoms worsen or don't improve      Andria Meuse, NP 06/06/15 [phone]

## 2015-06-06 NOTE — ED Notes (Signed)
Patient c/o sore throat and cough since yesterday.  Patient reports chills.  Patient denies fevers.

## 2015-06-08 LAB — CULTURE, GROUP A STREP (THRC)

## 2015-06-10 ENCOUNTER — Encounter: Payer: Self-pay | Admitting: Internal Medicine

## 2015-06-10 ENCOUNTER — Other Ambulatory Visit: Payer: Self-pay | Admitting: Endocrinology

## 2015-06-12 ENCOUNTER — Other Ambulatory Visit: Payer: Self-pay | Admitting: *Deleted

## 2015-06-12 DIAGNOSIS — E1165 Type 2 diabetes mellitus with hyperglycemia: Secondary | ICD-10-CM

## 2015-06-12 DIAGNOSIS — IMO0002 Reserved for concepts with insufficient information to code with codable children: Secondary | ICD-10-CM

## 2015-06-12 MED ORDER — GLIPIZIDE 5 MG PO TABS: ORAL_TABLET | ORAL | Status: DC

## 2015-06-16 ENCOUNTER — Encounter: Payer: Self-pay | Admitting: Emergency Medicine

## 2015-06-16 ENCOUNTER — Ambulatory Visit: Payer: 59

## 2015-06-16 ENCOUNTER — Ambulatory Visit
Admission: EM | Admit: 2015-06-16 | Discharge: 2015-06-16 | Disposition: A | Discharge status: Home / Self Care | Payer: 59 | Attending: Registered Nurse | Admitting: Registered Nurse

## 2015-06-16 DIAGNOSIS — J209 Acute bronchitis, unspecified: Secondary | ICD-10-CM

## 2015-06-16 DIAGNOSIS — J0110 Acute frontal sinusitis, unspecified: Secondary | ICD-10-CM | POA: Diagnosis not present

## 2015-06-16 DIAGNOSIS — J0100 Acute maxillary sinusitis, unspecified: Secondary | ICD-10-CM | POA: Diagnosis not present

## 2015-06-16 MED ORDER — MOMETASONE FUROATE 50 MCG/ACT NA SUSP: 2.0000 | Freq: Every day | NASAL | Status: DC

## 2015-06-16 MED ORDER — AMOXICILLIN-POT CLAVULANATE 875-125 MG PO TABS: 1.0000 | ORAL_TABLET | Freq: Two times a day (BID) | ORAL | Status: DC

## 2015-06-16 NOTE — ED Notes (Signed)
Patient c/o SOB that started couple days ago.  Patient also c/o right ear pain since yesterday.

## 2015-06-16 NOTE — Discharge Instructions (Signed)
Acute Bronchitis Bronchitis is inflammation of the airways that extend from the windpipe into the lungs (bronchi). The inflammation often causes mucus to develop. This leads to a cough, which is the most common symptom of bronchitis.  In acute bronchitis, the condition usually develops suddenly and goes away over time, usually in a couple weeks. Smoking, allergies, and asthma can make bronchitis worse. Repeated episodes of bronchitis may cause further lung problems.  CAUSES Acute bronchitis is most often caused by the same virus that causes a cold. The virus can spread from person to person (contagious) through coughing, sneezing, and touching contaminated objects. SIGNS AND SYMPTOMS   Cough.   Fever.   Coughing up mucus.   Body aches.   Chest congestion.   Chills.   Shortness of breath.   Sore throat.  DIAGNOSIS  Acute bronchitis is usually diagnosed through a physical exam. Your health care provider will also ask you questions about your medical history. Tests, such as chest X-rays, are sometimes done to rule out other conditions.  TREATMENT  Acute bronchitis usually goes away in a couple weeks. Oftentimes, no medical treatment is necessary. Medicines are sometimes given for relief of fever or cough. Antibiotic medicines are usually not needed but may be prescribed in certain situations. In some cases, an inhaler may be recommended to help reduce shortness of breath and control the cough. A cool mist vaporizer may also be used to help thin bronchial secretions and make it easier to clear the chest.  HOME CARE INSTRUCTIONS  Get plenty of rest.   Drink enough fluids to keep your urine clear or pale yellow (unless you have a medical condition that requires fluid restriction). Increasing fluids may help thin your respiratory secretions (sputum) and reduce chest congestion, and it will prevent dehydration.   Take medicines only as directed by your health care provider.  If  you were prescribed an antibiotic medicine, finish it all even if you start to feel better.  Avoid smoking and secondhand smoke. Exposure to cigarette smoke or irritating chemicals will make bronchitis worse. If you are a smoker, consider using nicotine gum or skin patches to help control withdrawal symptoms. Quitting smoking will help your lungs heal faster.   Reduce the chances of another bout of acute bronchitis by washing your hands frequently, avoiding people with cold symptoms, and trying not to touch your hands to your mouth, nose, or eyes.   Keep all follow-up visits as directed by your health care provider.  SEEK MEDICAL CARE IF: Your symptoms do not improve after 1 week of treatment.  SEEK IMMEDIATE MEDICAL CARE IF:  You develop an increased fever or chills.   You have chest pain.   You have severe shortness of breath.  You have bloody sputum.   You develop dehydration.  You faint or repeatedly feel like you are going to pass out.  You develop repeated vomiting.  You develop a severe headache. MAKE SURE YOU:   Understand these instructions.  Will watch your condition.  Will get help right away if you are not doing well or get worse. Document Released: 10/24/2004 Document Revised: 01/31/2014 Document Reviewed: 03/09/2013 [facility] Patient Information 2015 [place], [place]. This information is not intended to replace advice given to you by your health care provider. Make sure you discuss any questions you have with your health care provider.  General Headache Without Cause A headache is pain or discomfort felt around the head or neck area. The specific cause of a headache  may not be found. There are many causes and types of headaches. A few common ones are:  Tension headaches.  Migraine headaches.  Cluster headaches.  Chronic daily headaches. HOME CARE INSTRUCTIONS   Keep all follow-up appointments with your caregiver or any specialist referral.  Only  take over-the-counter or prescription medicines for pain or discomfort as directed by your caregiver.  Lie down in a dark, quiet room when you have a headache.  Keep a headache journal to find out what may trigger your migraine headaches. For example, write down:  What you eat and drink.  How much sleep you get.  Any change to your diet or medicines.  Try massage or other relaxation techniques.  Put ice packs or heat on the head and neck. Use these 3 to 4 times per day for 15 to 20 minutes each time, or as needed.  Limit stress.  Sit up straight, and do not tense your muscles.  Quit smoking if you smoke.  Limit alcohol use.  Decrease the amount of caffeine you drink, or stop drinking caffeine.  Eat and sleep on a regular schedule.  Get 7 to 9 hours of sleep, or as recommended by your caregiver.  Keep lights dim if bright lights bother you and make your headaches worse. SEEK MEDICAL CARE IF:   You have problems with the medicines you were prescribed.  Your medicines are not working.  You have a change from the usual headache.  You have nausea or vomiting. SEEK IMMEDIATE MEDICAL CARE IF:   Your headache becomes severe.  You have a fever.  You have a stiff neck.  You have loss of vision.  You have muscular weakness or loss of muscle control.  You start losing your balance or have trouble walking.  You feel faint or pass out.  You have severe symptoms that are different from your first symptoms. MAKE SURE YOU:   Understand these instructions.  Will watch your condition.  Will get help right away if you are not doing well or get worse. Document Released: 09/16/2005 Document Revised: 12/09/2011 Document Reviewed: 10/02/2011 [facility] Patient Information 2015 [place], [place]. This information is not intended to replace advice given to you by your health care provider. Make sure you discuss any questions you have with your health care  provider. Pharyngitis Pharyngitis is redness, pain, and swelling (inflammation) of your pharynx.  CAUSES  Pharyngitis is usually caused by infection. Most of the time, these infections are from viruses (viral) and are part of a cold. However, sometimes pharyngitis is caused by bacteria (bacterial). Pharyngitis can also be caused by allergies. Viral pharyngitis may be spread from person to person by coughing, sneezing, and personal items or utensils (cups, forks, spoons, toothbrushes). Bacterial pharyngitis may be spread from person to person by more intimate contact, such as kissing.  SIGNS AND SYMPTOMS  Symptoms of pharyngitis include:   Sore throat.   Tiredness (fatigue).   Low-grade fever.   Headache.  Joint pain and muscle aches.  Skin rashes.  Swollen lymph nodes.  Plaque-like film on throat or tonsils (often seen with bacterial pharyngitis). DIAGNOSIS  Your health care provider will ask you questions about your illness and your symptoms. Your medical history, along with a physical exam, is often all that is needed to diagnose pharyngitis. Sometimes, a rapid strep test is done. Other lab tests may also be done, depending on the suspected cause.  TREATMENT  Viral pharyngitis will usually get better in 3-4 days without the  use of medicine. Bacterial pharyngitis is treated with medicines that kill germs (antibiotics).  HOME CARE INSTRUCTIONS   Drink enough water and fluids to keep your urine clear or pale yellow.   Only take over-the-counter or prescription medicines as directed by your health care provider:   If you are prescribed antibiotics, make sure you finish them even if you start to feel better.   Do not take aspirin.   Get lots of rest.   Gargle with 8 oz of salt water ( tsp of salt per 1 qt of water) as often as every 1-2 hours to soothe your throat.   Throat lozenges (if you are not at risk for choking) or sprays may be used to soothe your  throat. SEEK MEDICAL CARE IF:   You have large, tender lumps in your neck.  You have a rash.  You cough up green, yellow-brown, or bloody spit. SEEK IMMEDIATE MEDICAL CARE IF:   Your neck becomes stiff.  You drool or are unable to swallow liquids.  You vomit or are unable to keep medicines or liquids down.  You have severe pain that does not go away with the use of recommended medicines.  You have trouble breathing (not caused by a stuffy nose). MAKE SURE YOU:   Understand these instructions.  Will watch your condition.  Will get help right away if you are not doing well or get worse. Document Released: 09/16/2005 Document Revised: 07/07/2013 Document Reviewed: 05/24/2013 [facility] Patient Information 2015 [place], [place]. This information is not intended to replace advice given to you by your health care provider. Make sure you discuss any questions you have with your health care provider. Otitis Media With Effusion Otitis media with effusion is the presence of fluid in the middle ear. This is a common problem in children, which often follows ear infections. It may be present for weeks or longer after the infection. Unlike an acute ear infection, otitis media with effusion refers only to fluid behind the ear drum and not infection. Children with repeated ear and sinus infections and allergy problems are the most likely to get otitis media with effusion. CAUSES  The most frequent cause of the fluid buildup is dysfunction of the eustachian tubes. These are the tubes that drain fluid in the ears to the back of the nose (nasopharynx). SYMPTOMS   The main symptom of this condition is hearing loss. As a result, you or your child may:  Listen to the TV at a loud volume.  Not respond to questions.  Ask "what" often when spoken to.  Mistake or confuse one sound or word for another.  There may be a sensation of fullness or pressure but usually not pain. DIAGNOSIS   Your health  care provider will diagnose this condition by examining you or your child's ears.  Your health care provider may test the pressure in you or your child's ear with a tympanometer.  A hearing test may be conducted if the problem persists. TREATMENT   Treatment depends on the duration and the effects of the effusion.  Antibiotics, decongestants, nose drops, and cortisone-type drugs (tablets or nasal spray) may not be helpful.  Children with persistent ear effusions may have delayed language or behavioral problems. Children at risk for developmental delays in hearing, learning, and speech may require referral to a specialist earlier than children not at risk.  You or your child's health care provider may suggest a referral to an ear, nose, and throat surgeon for treatment.  The following may help restore normal hearing:  Drainage of fluid.  Placement of ear tubes (tympanostomy tubes).  Removal of adenoids (adenoidectomy). HOME CARE INSTRUCTIONS   Avoid secondhand smoke.  Infants who are breastfed are less likely to have this condition.  Avoid feeding infants while they are lying flat.  Avoid known environmental allergens.  Avoid people who are sick. SEEK MEDICAL CARE IF:   Hearing is not better in 3 months.  Hearing is worse.  Ear pain.  Drainage from the ear.  Dizziness. MAKE SURE YOU:   Understand these instructions.  Will watch your condition.  Will get help right away if you are not doing well or get worse. Document Released: 10/24/2004 Document Revised: 01/31/2014 Document Reviewed: 04/13/2013 [facility] Patient Information 2015 [place], [place]. This information is not intended to replace advice given to you by your health care provider. Make sure you discuss any questions you have with your health care provider.

## 2015-06-16 NOTE — ED Provider Notes (Signed)
CSN: [id number]     Arrival date & time 06/16/15  9622 History   None    Chief Complaint  Patient presents with  . Shortness of Breath  . Otalgia   (Consider location/radiation/quality/duration/timing/severity/associated sxs/prior Treatment) HPI Comments: Single caucasian female here for re-evaluation of nasal congestion, sore throat, nonproductive cough, headache frontal, right ear pain, shortness of breath x 3 days using albuterol inhaler 2 puffs q6h, glands in neck hurt, stopped humibid prescribed by NP Debbora Presto 6 Sep restarted Zyrtec productive cough changed to nonproductive cough but still having post nasal drip.  Was told she had sinus infection, fluid in ears at last appt.  Cannot take prednisone.  Was hospitalized for 5 days in Dec 2015 for atypical pneumonia per patient no fever just cough.  Would like chest xray today.  Has nasonex at home not using for seasonal allergies  Patient is a 59 y.o. female presenting with shortness of breath and ear pain. The history is provided by the patient.  Shortness of Breath Severity:  Moderate Onset quality:  Gradual Duration:  3 days Timing:  Intermittent Progression:  Waxing and waning Chronicity:  New Context: activity, known allergens, pollens, URI and weather changes   Context: not animal exposure, not emotional upset, not fumes, not occupational exposure, not smoke exposure and not strong odors   Relieved by:  Inhaler Worsened by:  Activity Ineffective treatments:  Inhaler, rest and position changes Associated symptoms: cough, ear pain, headaches, PND, sore throat, sputum production, swollen glands and wheezing   Associated symptoms: no abdominal pain, no chest pain, no claudication, no diaphoresis, no fever, no hemoptysis, no neck pain, no rash, no syncope and no vomiting   Cough:    Cough characteristics:  Non-productive   Severity:  Moderate   Onset quality:  Gradual   Duration:  10 days   Timing:  Intermittent   Progression:   Unchanged   Chronicity:  New Ear pain:    Location:  Right   Severity:  Moderate   Onset quality:  Gradual   Duration:  10 days   Timing:  Constant   Progression:  Worsening   Chronicity:  New Headaches:    Severity:  Moderate   Onset quality:  Gradual   Duration:  10 days   Timing:  Constant   Progression:  Unchanged   Chronicity:  New Sore throat:    Severity:  Mild   Onset quality:  Gradual   Timing:  Constant   Progression:  Unchanged Wheezing:    Severity:  Mild   Onset quality:  Gradual   Duration:  3 days   Timing:  Intermittent   Progression:  Worsening   Chronicity:  Recurrent Risk factors: obesity   Risk factors: no recent alcohol use, no family hx of DVT, no hx of cancer, no hx of PE/DVT, no oral contraceptive use, no prolonged immobilization, no recent surgery and no tobacco use   Otalgia Associated symptoms: congestion, cough, headaches, rhinorrhea and sore throat   Associated symptoms: no abdominal pain, no diarrhea, no ear discharge, no fever, no hearing loss, no neck pain, no rash, no tinnitus and no vomiting     Past Medical History  Diagnosis Date  . Otitis media, chronic   . Diabetes mellitus   . Fibromyalgia   . Breast cancer 5/14    left breast  . Lymphedema of arm     right  . Hyperlipidemia   . Heart murmur   . Diverticulosis   .  Peptic ulcer   . Sjogren's disease   . Breast cancer 02/2013    Dr Laurance Flatten ([facility])   Past Surgical History  Procedure Laterality Date  . Mastectomy Bilateral 03/23/13  . Breast surgery     Family History  Problem Relation Age of Onset  . Diabetes Mother   . Heart disease Mother   . Hyperlipidemia Mother   . Heart failure Mother   . Heart disease Brother   . Hyperlipidemia Brother   . Diabetes Other   . Colon polyps     Social History  Substance Use Topics  . Smoking status: Former Smoker -- 0.50 packs/day    Types: Cigarettes    Quit date: 02/28/2013  . Smokeless tobacco: Never Used  .  Alcohol Use: No   OB History    No data available     Review of Systems  Constitutional: Negative for fever, chills, diaphoresis, activity change, appetite change and fatigue.  HENT: Positive for congestion, ear pain, postnasal drip, rhinorrhea, sinus pressure, sneezing and sore throat. Negative for dental problem, drooling, ear discharge, facial swelling, hearing loss, mouth sores, nosebleeds, tinnitus, trouble swallowing and voice change.   Eyes: Negative for photophobia, pain, discharge, redness, itching and visual disturbance.  Respiratory: Positive for cough, sputum production, shortness of breath and wheezing. Negative for hemoptysis, choking, chest tightness and stridor.   Cardiovascular: Positive for PND. Negative for chest pain, claudication and syncope.  Gastrointestinal: Negative for nausea, vomiting, abdominal pain, diarrhea, constipation, blood in stool and abdominal distention.  Endocrine: Negative for cold intolerance and heat intolerance.  Genitourinary: Negative for dysuria.  Musculoskeletal: Negative for myalgias, back pain, joint swelling, arthralgias, gait problem, neck pain and neck stiffness.  Skin: Negative for color change, pallor, rash and wound.  Allergic/Immunologic: Positive for environmental allergies. Negative for food allergies.  Neurological: Positive for headaches. Negative for dizziness, tremors, seizures, syncope, facial asymmetry, speech difficulty, weakness, light-headedness and numbness.  Hematological: Negative for adenopathy. Does not bruise/bleed easily.  Psychiatric/Behavioral: Negative for behavioral problems, confusion, sleep disturbance and agitation.    Allergies  Gabapentin; Buprenorphine hcl; Methylprednisolone; Morphine and related; and Pregabalin  Home Medications   Prior to Admission medications   Medication Sig Start Date End Date Taking? Authorizing Provider  albuterol (PROVENTIL HFA;VENTOLIN HFA) 108 (90 BASE) MCG/ACT inhaler Inhale  2 puffs into the lungs every 6 (six) hours as needed for wheezing or shortness of breath. 10/07/14   Jackolyn Confer, MD  ALPRAZolam Duanne Moron) 0.5 MG tablet TAKE ONE TABLET FOUR TIMES DAILY AS NEEDED FOR ANXIETY 05/26/15   Jackolyn Confer, MD  BAYER CONTOUR NEXT TEST test strip CHECK BLOOD SUGAR SIX TIMES PER DAY AS DIRECTED 09/26/14   Jackolyn Confer, MD  cetirizine (ZYRTEC) 10 MG tablet Take 10 mg by mouth 2 (two) times daily.    Historical Provider, MD  clobetasol ointment (TEMOVATE) 2.77 % Apply 1 application topically 2 (two) times daily.    Historical Provider, MD  cyclobenzaprine (FLEXERIL) 5 MG tablet Take 1 tablet (5 mg total) by mouth 3 (three) times daily as needed for muscle spasms. 02/22/15   Jackolyn Confer, MD  DULoxetine (CYMBALTA) 30 MG capsule Take 1 capsule (30 mg total) by mouth daily. 02/22/15   Jackolyn Confer, MD  Emollient (CERAVE) CREA Apply topically.    Historical Provider, MD  glipiZIDE (GLUCOTROL) 5 MG tablet Take 5 mg in the morning and 10 mg at evening 06/12/15   Jackolyn Confer, MD  guaifenesin (HUMIBID E)  400 MG TABS tablet Take 1 tablet (400 mg total) by mouth every 4 (four) hours. 06/06/15   Andria Meuse, NP  hyoscyamine (LEVSIN, ANASPAZ) 0.125 MG tablet Take 1 tablet (0.125 mg total) by mouth every 4 (four) hours as needed. 11/07/14   Jackolyn Confer, MD  Insulin Glargine (LANTUS SOLOSTAR) 100 UNIT/ML Solostar Pen Inject 12 Units into the skin daily at 10 pm. 12/15/14   Haydee Monica, MD  Lancets (FREESTYLE) lancets USE TWICE A DAY 09/26/14   Jackolyn Confer, MD  nystatin (MYCOSTATIN) 100000 UNIT/ML suspension Take 5 ml QID. Retain in mouth as long as possible then swallow. Use for 48 hours after symptoms resolve. Patient not taking: Reported on 06/06/2015 03/27/15   Jackolyn Confer, MD  oxyCODONE (OXY IR/ROXICODONE) 5 MG immediate release tablet Take 1 tablet (5 mg total) by mouth 2 (two) times daily. As needed for sever pain. 03/27/15   Jackolyn Confer,  MD  pantoprazole (PROTONIX) 40 MG tablet Take 1 tablet (40 mg total) by mouth 2 (two) times daily. 05/08/15   Jackolyn Confer, MD  prochlorperazine (COMPAZINE) 10 MG tablet Take 10 mg by mouth every 6 (six) hours as needed.    Historical Provider, MD  ranitidine (ZANTAC) 300 MG tablet Take 1 tablet (300 mg total) by mouth daily. 06/02/14   Jackolyn Confer, MD  tamoxifen (NOLVADEX) 20 MG tablet Take 20 mg by mouth daily.    Historical Provider, MD   Meds Ordered and Administered this Visit  Medications - No data to display  BP 113/74 mmHg  Pulse 84  Temp(Src) 97.8 F (36.6 C) (Tympanic)  Resp 16  Ht 5\' 4"  (1.626 m)  Wt 185 lb (83.915 kg)  BMI 31.74 kg/m2  SpO2 100% No data found.   Physical Exam  Constitutional: She is oriented to person, place, and time. Vital signs are normal. She appears well-developed and well-nourished. No distress.  HENT:  Head: Normocephalic and atraumatic.  Right Ear: Hearing, external ear and ear canal normal. A middle ear effusion is present.  Left Ear: Hearing, external ear and ear canal normal. A middle ear effusion is present.  Nose: Mucosal edema and rhinorrhea present. No nose lacerations, sinus tenderness, nasal deformity, septal deviation or nasal septal hematoma. No epistaxis.  No foreign bodies. Right sinus exhibits maxillary sinus tenderness and frontal sinus tenderness. Left sinus exhibits maxillary sinus tenderness and frontal sinus tenderness.  Mouth/Throat: Uvula is midline and mucous membranes are normal. Mucous membranes are not pale, not dry and not cyanotic. She does not have dentures. No oral lesions. No trismus in the jaw. Normal dentition. No dental abscesses, uvula swelling, lacerations or dental caries. Posterior oropharyngeal edema and posterior oropharyngeal erythema present. No oropharyngeal exudate or tonsillar abscesses.  Cobblestoning posterior pharynx; right TM with air fluid level slight opacity; left TM with air fluid level clear;  bilateral turbinates with edema/erythema clear discharge  Eyes: Conjunctivae, EOM and lids are normal. Pupils are equal, round, and reactive to light. Right eye exhibits no discharge. Left eye exhibits no discharge. No scleral icterus.  Neck: Trachea normal and normal range of motion. Neck supple. No tracheal deviation present.  Cardiovascular: Normal rate, regular rhythm, normal heart sounds and intact distal pulses.  Exam reveals no gallop and no friction rub.   No murmur heard. Pulmonary/Chest: Effort normal and breath sounds normal. No accessory muscle usage or stridor. No respiratory distress. She has no decreased breath sounds. She has no wheezes. She has  no rhonchi. She has no rales. She exhibits no tenderness.  Negative egophany all fields  Abdominal: Soft. She exhibits no distension.  Musculoskeletal: Normal range of motion. She exhibits no edema or tenderness.  Lymphadenopathy:    She has no cervical adenopathy.  Neurological: She is alert and oriented to person, place, and time. She exhibits normal muscle tone. Coordination normal.  Skin: Skin is warm, dry and intact. No rash noted. She is not diaphoretic. No erythema. No pallor.  Psychiatric: She has a normal mood and affect. Her speech is normal and behavior is normal. Judgment and thought content normal. Cognition and memory are normal.  Nursing note and vitals reviewed.   ED Course  Procedures (including critical care time)  Labs Review Labs Reviewed - No data to display  Imaging Review Dg Chest 2 View  06/16/2015   CLINICAL DATA:  Cough, shortness of breath for 10 days.  EXAM: CHEST  2 VIEW  COMPARISON:  03/10/2015  FINDINGS: The heart size and mediastinal contours are within normal limits. Both lungs are clear. The visualized skeletal structures are unremarkable.  IMPRESSION: No active cardiopulmonary disease.   Electronically Signed   By: Rolm Baptise M.D.   On: 06/16/2015 08:36   0900 discussed with patient chest xray  normal/negative fluid/pneumonia.  Given copy of radiology report.  Patient verbalized understanding of information/instructions, agreed with plan of care and had no further questions at this time.  MDM   1. Acute bronchitis, unspecified organism   2. Acute maxillary sinusitis, recurrence not specified   3. Acute frontal sinusitis, recurrence not specified    Restart nasonex 2 sprays each nostril daily.  Nasal saline 2 sprays each nostril q2h while awake.  If no improvement symptoms 48 hours start augmentin 875mg  po BID x 10 days Rx given.  No evidence of systemic bacterial infection, non toxic and well hydrated.  I do not see where any further testing or imaging is necessary at this time.   I will suggest supportive care, rest, good hygiene and encourage the patient to take adequate fluids.  The patient is to return to clinic or EMERGENCY ROOM if symptoms worsen or change significantly.  Exitcare handout on sinusitis given to patient.  Patient verbalized agreement and understanding of treatment plan and had no further questions at this time.   P2:  Hand washing and cover cough  sp02 stable 100% room air.  Continue albuterol 2p po q6h prn cough/wheezing.  Paper Rx augmentin 875mg  po BID.  Patient does not tolerate oral steroids.  Restarting nasonex. Honey with lemon, lemon tea.   Bronchitis simple, community acquired, may have started as viral (probably respiratory syncytial, parainfluenza, influenza, or adenovirus), but now evidence of acute purulent bronchitis with resultant bronchial edema and mucus formation.  Viruses are the most common cause of bronchial inflammation in otherwise healthy adults with acute bronchitis.  The appearance of sputum is not predictive of whether a bacterial infection is present.  Purulent sputum is most often caused by viral infections.  There are a small portion of those caused by non-viral agents being Mycoplamsa pneumonia.  Microscopic examination or C&S of sputum in the  healthy adult with acute bronchitis is generally not helpful (usually negative or normal respiratory flora) other considerations being cough from upper respiratory tract infections, sinusitis or allergic syndromes (mild asthma or viral pneumonia).  Differential Diagnosis:  reactive airway disease (asthma, allergic aspergillosis (eosinophilia), chronic bronchitis, respiratory infection (Sinusitis, Common cold, pneumonia), congestive heart failure, reflux esophagitis, bronchogenic  tumor, aspiration syndromes and/or exposure irritants/tobacco smoke.  In this case, there is no evidence of any invasive bacterial illness.  Most likely viral etiology so will hold on antibiotic treatment.  Advise supportive care with rest, encourage fluids, good hygiene and watch for any worsening symptoms.  If they were to develop:  come back to the office or go to the emergency room if after hours.  Without high fever, severe dyspnea, lack of physical findings or other risk factors, I will hold on a chest radiograph and CBC at this time.  I discussed that approximately 50% of patients with acute bronchitis have a cough that lasts up to three weeks, and 25% for over a month.  Tylenol, one to two tablets every four hours as needed for fever or myalgias.   No aspirin.  Patient instructed to follow up in one week or sooner if symptoms worsen. Patient verbalized agreement and understanding of treatment plan.  P2:  hand washing and cover cough  Slight opacity right ear.  Clear left ear.  Paper Rx for augmentin 875mg  po BID  Supportive treatment.   No evidence of invasive bacterial infection, non toxic and well hydrated.  This is most likely self limiting viral infection.  I do not see where any further testing or imaging is necessary at this time.   I will suggest supportive care, rest, good hygiene and encourage the patient to take adequate fluids.  The patient is to return to clinic or EMERGENCY ROOM if symptoms worsen or change  significantly e.g. ear pain, fever, purulent discharge from ears or bleeding.  Exitcare handout on otitis media with effusion given to patient.  Patient verbalized agreement and understanding of treatment plan.    Olen Cordial, NP 06/16/15 [phone]

## 2015-06-19 ENCOUNTER — Other Ambulatory Visit: Payer: Self-pay

## 2015-06-19 ENCOUNTER — Telehealth: Payer: Self-pay

## 2015-06-19 ENCOUNTER — Telehealth (HOSPITAL_COMMUNITY): Payer: Self-pay

## 2015-06-19 MED ORDER — INSULIN GLARGINE 100 UNIT/ML SOLOSTAR PEN: 12.0000 [IU] | PEN_INJECTOR | Freq: Every day | SUBCUTANEOUS | Status: DC

## 2015-06-19 NOTE — Telephone Encounter (Signed)
Pharmacy has sent over a refill request for tradjenta, BD ultra-fine NANo EA #[phone], and the medication i have checked to refill. I do not see the tradjenta or BD ultra fine NANO. Please advise?

## 2015-06-19 NOTE — ED Notes (Signed)
Pt called and stated T Betancort NP sent Rx for Nasanex over to [facility], but her insurance needs pre-auth. Writer spoke with Provider and pt instructed to purchase over the counter Flonase or Nasacort. Pt verbalized understanding.

## 2015-06-29 ENCOUNTER — Other Ambulatory Visit: Payer: Self-pay | Admitting: Oncology

## 2015-06-29 DIAGNOSIS — R51 Headache: Secondary | ICD-10-CM

## 2015-06-29 DIAGNOSIS — M545 Low back pain, unspecified: Secondary | ICD-10-CM

## 2015-06-29 DIAGNOSIS — R519 Headache, unspecified: Secondary | ICD-10-CM

## 2015-07-19 ENCOUNTER — Encounter: Payer: Self-pay | Admitting: Internal Medicine

## 2015-07-19 ENCOUNTER — Ambulatory Visit (INDEPENDENT_AMBULATORY_CARE_PROVIDER_SITE_OTHER): Payer: 59 | Admitting: Internal Medicine

## 2015-07-19 VITALS — BP 120/79 | HR 68 | Temp 97.4°F | Ht 64.0 in | Wt 187.5 lb

## 2015-07-19 DIAGNOSIS — Z794 Long term (current) use of insulin: Secondary | ICD-10-CM

## 2015-07-19 DIAGNOSIS — I70219 Atherosclerosis of native arteries of extremities with intermittent claudication, unspecified extremity: Secondary | ICD-10-CM

## 2015-07-19 DIAGNOSIS — I739 Peripheral vascular disease, unspecified: Secondary | ICD-10-CM

## 2015-07-19 DIAGNOSIS — E1165 Type 2 diabetes mellitus with hyperglycemia: Secondary | ICD-10-CM

## 2015-07-19 DIAGNOSIS — M3500 Sicca syndrome, unspecified: Secondary | ICD-10-CM | POA: Diagnosis not present

## 2015-07-19 DIAGNOSIS — C50919 Malignant neoplasm of unspecified site of unspecified female breast: Secondary | ICD-10-CM | POA: Diagnosis not present

## 2015-07-19 DIAGNOSIS — E1140 Type 2 diabetes mellitus with diabetic neuropathy, unspecified: Secondary | ICD-10-CM

## 2015-07-19 DIAGNOSIS — D689 Coagulation defect, unspecified: Secondary | ICD-10-CM

## 2015-07-19 DIAGNOSIS — IMO0002 Reserved for concepts with insufficient information to code with codable children: Secondary | ICD-10-CM

## 2015-07-19 LAB — LIPID PANEL
Cholesterol: 197 mg/dL (ref 0–200)
HDL: 45.6 mg/dL (ref >39.00)
LDL Cholesterol: 122 mg/dL — ABNORMAL HIGH (ref 0–99)
NonHDL: 151.61
Total CHOL/HDL Ratio: 4
Triglycerides: 146 mg/dL (ref 0.0–149.0)
VLDL: 29.2 mg/dL (ref 0.0–40.0)

## 2015-07-19 LAB — COMPREHENSIVE METABOLIC PANEL
ALT: 32 U/L (ref 0–35)
AST: 25 U/L (ref 0–37)
Albumin: 3.9 g/dL (ref 3.5–5.2)
Alkaline Phosphatase: 80 U/L (ref 39–117)
BUN: 11 mg/dL (ref 6–23)
CO2: 27 mEq/L (ref 19–32)
Calcium: 9.2 mg/dL (ref 8.4–10.5)
Chloride: 98 mEq/L (ref 96–112)
Creatinine, Ser: 0.88 mg/dL (ref 0.40–1.20)
GFR: 70.27 mL/min (ref >60.00)
Glucose, Bld: 360 mg/dL — ABNORMAL HIGH (ref 70–99)
Potassium: 4.6 mEq/L (ref 3.5–5.1)
Sodium: 132 mEq/L — ABNORMAL LOW (ref 135–145)
Total Bilirubin: 0.4 mg/dL (ref 0.2–1.2)
Total Protein: 7.4 g/dL (ref 6.0–8.3)

## 2015-07-19 LAB — MICROALBUMIN / CREATININE URINE RATIO
Creatinine,U: 13.3 mg/dL
Microalb Creat Ratio: 5.3 mg/g (ref 0.0–30.0)
Microalb, Ur: <0.7 mg/dL (ref 0.0–1.9)

## 2015-07-19 LAB — HEMOGLOBIN A1C: Hgb A1c MFr Bld: 8.4 % — ABNORMAL HIGH (ref 4.6–6.5)

## 2015-07-19 NOTE — Assessment & Plan Note (Signed)
Continues on Tamoxifen. Continue to followup with oncology.

## 2015-07-19 NOTE — Assessment & Plan Note (Signed)
Noted to have elevated anti-cardiolipin IgM. Continue follow up with hematology/oncology.

## 2015-07-19 NOTE — Patient Instructions (Signed)
Labs today.  Follow up in 3 months or sooner as needed. 

## 2015-07-19 NOTE — Assessment & Plan Note (Signed)
BG variable per pt. Will check A1c with labs. Continue current medications.

## 2015-07-19 NOTE — Assessment & Plan Note (Signed)
Chronic pain related Sjoegren's. Continue to follow up with rheumatology. Not currently taking Plaquenil.

## 2015-07-19 NOTE — Progress Notes (Signed)
Pre visit review using our clinic review tool, if applicable. No additional management support is needed unless otherwise documented below in the visit note. 

## 2015-07-19 NOTE — Progress Notes (Signed)
Subjective:    Patient ID: Kristina Mccoy, female    DOB: [birth year], 57 y.o.   MRN: [id number]  HPI  60YO female presents for follow up.  DM - BG variable. Over 200 at times. Compliant with medication.  In 06/2015 treated for acute bronchitis. Treated in the ER. Treated with Augmentin. Symptoms have now improved. Some ear pain off and on, however congestion improved.  Also had a stomach bug with NV in early 06/2015.  Chronic pain - Having back and leg pain. Oncologist ordered MRI of lower back for next months. Occasionally pain radiates up and down back. Some aching pain in lower legs. Seen by Dr. Humphrey Rolls for pain management. Started on Carbemazepmine with Oxycodone. Unable to tolerate Carbemazepine because of nausea. Continues to use Oxycodone bid. Trying to limit Advil. Taking 600mg  of Advil bid.   Wt Readings from Last 3 Encounters:  07/19/15 187 lb 8 oz (85.049 kg)  06/16/15 185 lb (83.915 kg)  06/06/15 190 lb (86.183 kg)   BP Readings from Last 3 Encounters:  07/19/15 120/79  06/16/15 113/74  06/06/15 119/75    Past Medical History  Diagnosis Date  . Otitis media, chronic   . Diabetes mellitus   . Fibromyalgia   . Breast cancer ([place]) 5/14    left breast  . Lymphedema of arm     right  . Hyperlipidemia   . Heart murmur   . Diverticulosis   . Peptic ulcer   . Sjogren's disease ([place])   . Breast cancer ([place]) 02/2013    Dr Laurance Flatten ([facility])   Family History  Problem Relation Age of Onset  . Diabetes Mother   . Heart disease Mother   . Hyperlipidemia Mother   . Heart failure Mother   . Heart disease Brother   . Hyperlipidemia Brother   . Diabetes Other   . Colon polyps     Past Surgical History  Procedure Laterality Date  . Mastectomy Bilateral 03/23/13  . Breast surgery     Social History   Social History  . Marital Status: Single    Spouse Name: N/A  . Number of Children: N/A  . Years of Education: N/A   Occupational History  . disabled     Social History Main Topics  . Smoking status: Former Smoker -- 0.50 packs/day    Types: Cigarettes    Quit date: 02/28/2013  . Smokeless tobacco: Never Used  . Alcohol Use: No  . Drug Use: No  . Sexual Activity: Not Asked   Other Topics Concern  . None   Social History Narrative    Review of Systems  Constitutional: Negative for fever, chills, appetite change, fatigue and unexpected weight change.  Eyes: Negative for visual disturbance.  Respiratory: Negative for shortness of breath.   Cardiovascular: Negative for chest pain and leg swelling.  Gastrointestinal: Negative for nausea, vomiting, abdominal pain, diarrhea and constipation.  Musculoskeletal: Positive for myalgias and arthralgias.  Skin: Negative for color change and rash.  Hematological: Negative for adenopathy. Does not bruise/bleed easily.  Psychiatric/Behavioral: Positive for sleep disturbance and dysphoric mood. The patient is nervous/anxious.        Objective:    BP 120/79 mmHg  Pulse 68  Temp(Src) 97.4 F (36.3 C) (Oral)  Ht 5\' 4"  (1.626 m)  Wt 187 lb 8 oz (85.049 kg)  BMI 32.17 kg/m2  SpO2 100% Physical Exam  Constitutional: She is oriented to person, place, and time. She appears well-developed and well-nourished. No distress.  HENT:  Head: Normocephalic and atraumatic.  Right Ear: External ear normal.  Left Ear: External ear normal.  Nose: Nose normal.  Mouth/Throat: Oropharynx is clear and moist. No oropharyngeal exudate.  Eyes: Conjunctivae are normal. Pupils are equal, round, and reactive to light. Right eye exhibits no discharge. Left eye exhibits no discharge. No scleral icterus.  Neck: Normal range of motion. Neck supple. No tracheal deviation present. No thyromegaly present.  Cardiovascular: Normal rate, regular rhythm, normal heart sounds and intact distal pulses.  Exam reveals no gallop and no friction rub.   No murmur heard. Pulmonary/Chest: Effort normal and breath sounds normal. No  respiratory distress. She has no wheezes. She has no rales. She exhibits no tenderness.  Musculoskeletal: She exhibits no edema.       Lumbar back: She exhibits tenderness and pain. She exhibits normal range of motion and no bony tenderness.  Lymphadenopathy:    She has no cervical adenopathy.  Neurological: She is alert and oriented to person, place, and time. No cranial nerve deficit. She exhibits normal muscle tone. Coordination normal.  Skin: Skin is warm and dry. No rash noted. She is not diaphoretic. No erythema. No pallor.  Psychiatric: She has a normal mood and affect. Her behavior is normal. Judgment and thought content normal.          Assessment & Plan:   Problem List Items Addressed This Visit      Unprioritized   Atherosclerotic peripheral vascular disease with intermittent claudication ([place])    Continues to have bilateral leg pain. MRI lumbar spine pending. If results normal, consider follow up vascular evaluation.      Coagulation disorder ([place])    Noted to have elevated anti-cardiolipin IgM. Continue follow up with hematology/oncology.      Diabetes type 2, uncontrolled ([place]) - Primary    BG variable per pt. Will check A1c with labs. Continue current medications.      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 Continue to follow up with rheumatology. Not currently taking Plaquenil.      Malignant neoplasm of breast (female) ([place])    Continues on Tamoxifen. Continue to followup with oncology.          Return in about 3 months (around 10/19/2015) for Recheck of Diabetes.

## 2015-07-19 NOTE — Assessment & Plan Note (Signed)
Continues to have bilateral leg pain. MRI lumbar spine pending. If results normal, consider follow up vascular evaluation.

## 2015-07-31 ENCOUNTER — Ambulatory Visit: Payer: 59 | Admitting: Internal Medicine

## 2015-08-13 ENCOUNTER — Ambulatory Visit
Admission: EM | Admit: 2015-08-13 | Discharge: 2015-08-13 | Disposition: A | Discharge status: Home / Self Care | Payer: 59 | Attending: Family Medicine | Admitting: Family Medicine

## 2015-08-13 ENCOUNTER — Encounter: Payer: Self-pay | Admitting: Gynecology

## 2015-08-13 DIAGNOSIS — H6501 Acute serous otitis media, right ear: Secondary | ICD-10-CM

## 2015-08-13 DIAGNOSIS — J40 Bronchitis, not specified as acute or chronic: Secondary | ICD-10-CM

## 2015-08-13 DIAGNOSIS — J0100 Acute maxillary sinusitis, unspecified: Secondary | ICD-10-CM | POA: Diagnosis not present

## 2015-08-13 MED ORDER — FEXOFENADINE-PSEUDOEPHED ER 180-240 MG PO TB24: 1.0000 | ORAL_TABLET | Freq: Every day | ORAL | Status: DC

## 2015-08-13 MED ORDER — HYDROCOD POLST-CPM POLST ER 10-8 MG/5ML PO SUER: 5.0000 mL | Freq: Two times a day (BID) | ORAL | Status: DC | PRN

## 2015-08-13 MED ORDER — AMOXICILLIN-POT CLAVULANATE 875-125 MG PO TABS: 1.0000 | ORAL_TABLET | Freq: Two times a day (BID) | ORAL | Status: DC

## 2015-08-13 NOTE — ED Provider Notes (Signed)
CSN: [id number]     Arrival date & time 08/13/15  0803 History   First MD Initiated Contact with Patient 08/13/15 0825    Nurses notes were reviewed. Chief Complaint  Patient presents with  . Otalgia   patient states she's been sick now for about almost a week in the last 3 days things got a lot worse. Along with the nasal congestion sore throat pressure behind both ears and pain in the ears she's also been coughing up phlegm and difficulty sleeping at night. She states has some wheezing as well she does have an inhaler home she does use steroid nasal sprays at home as well. (Consider location/radiation/quality/duration/timing/severity/associated sxs/prior Treatment) Patient is a 59 y.o. female presenting with ear pain. The history is provided by the patient. No language interpreter was used.  Otalgia Location:  Bilateral Quality:  Pressure and throbbing Severity:  Moderate Duration:  3 days Timing:  Constant Progression:  Worsening Chronicity:  New Relieved by:  Nothing Worsened by:  Coughing Ineffective treatments:  None tried Associated symptoms: congestion, cough, headaches, rhinorrhea and sore throat   Associated symptoms: no abdominal pain, no fever and no rash   Congestion:    Location:  Nasal Cough:    Cough characteristics:  Non-productive, productive, hoarse and nocturnal   Sputum characteristics:  Green   Severity:  Moderate   Timing:  Constant   Progression:  Unchanged Rhinorrhea:    Quality:  Green   Timing:  Sporadic   Progression:  Worsening Sore throat:    Severity:  Moderate   Past Medical History  Diagnosis Date  . Otitis media, chronic   . Diabetes mellitus   . Fibromyalgia   . Breast cancer ([place]) 5/14    left breast  . Lymphedema of arm     right  . Hyperlipidemia   . Heart murmur   . Diverticulosis   . Peptic ulcer   . Sjogren's disease ([place])   . Breast cancer ([place]) 02/2013    Dr Laurance Flatten ([facility])   Past Surgical History  Procedure  Laterality Date  . Mastectomy Bilateral 03/23/13  . Breast surgery     Family History  Problem Relation Age of Onset  . Diabetes Mother   . Heart disease Mother   . Hyperlipidemia Mother   . Heart failure Mother   . Heart disease Brother   . Hyperlipidemia Brother   . Diabetes Other   . Colon polyps     Social History  Substance Use Topics  . Smoking status: Former Smoker -- 0.50 packs/day    Types: Cigarettes    Quit date: 02/28/2013  . Smokeless tobacco: Never Used  . Alcohol Use: No   OB History    No data available     Review of Systems  Constitutional: Negative for fever.  HENT: Positive for congestion, ear pain, rhinorrhea and sore throat.   Respiratory: Positive for cough, shortness of breath and wheezing.   Gastrointestinal: Negative for abdominal pain.  Skin: Negative for rash.  Neurological: Positive for headaches.  All other systems reviewed and are negative.   Allergies  Gabapentin; Buprenorphine hcl; Carbamazepine; Methylprednisolone; Morphine and related; and Pregabalin  Home Medications   Prior to Admission medications   Medication Sig Start Date End Date Taking? Authorizing Provider  albuterol (PROVENTIL HFA;VENTOLIN HFA) 108 (90 BASE) MCG/ACT inhaler Inhale 2 puffs into the lungs every 6 (six) hours as needed for wheezing or shortness of breath. 10/07/14  Yes Jackolyn Confer, MD  ALPRAZolam (  XANAX) 0.5 MG tablet TAKE ONE TABLET FOUR TIMES DAILY AS NEEDED FOR ANXIETY 05/26/15  Yes Jackolyn Confer, MD  BAYER CONTOUR NEXT TEST test strip CHECK BLOOD SUGAR SIX TIMES PER DAY AS DIRECTED 09/26/14  Yes Jackolyn Confer, MD  cetirizine (ZYRTEC) 10 MG tablet Take 10 mg by mouth 2 (two) times daily.   Yes Historical Provider, MD  clobetasol ointment (TEMOVATE) [id number] % Apply 1 application topically 2 (two) times daily.   Yes Historical Provider, MD  cyclobenzaprine (FLEXERIL) 5 MG tablet Take 1 tablet (5 mg total) by mouth 3 (three) times daily as needed for  muscle spasms. 02/22/15  Yes Jackolyn Confer, MD  Emollient (CERAVE) CREA Apply topically.   Yes Historical Provider, MD  glipiZIDE (GLUCOTROL) 5 MG tablet Take 5 mg in the morning and 10 mg at evening 06/12/15  Yes Jackolyn Confer, MD  hyoscyamine (LEVSIN, ANASPAZ) 0.125 MG tablet Take 1 tablet (0.125 mg total) by mouth every 4 (four) hours as needed. 11/07/14  Yes Jackolyn Confer, MD  Insulin Glargine (LANTUS SOLOSTAR) 100 UNIT/ML Solostar Pen Inject 12 Units into the skin daily at 10 pm. 06/19/15  Yes Jackolyn Confer, MD  Lancets (FREESTYLE) lancets USE TWICE A DAY 09/26/14  Yes Jackolyn Confer, MD  mometasone (NASONEX) 50 MCG/ACT nasal spray Place 2 sprays into the nose daily. 06/16/15  Yes Olen Cordial, NP  oxyCODONE (OXY IR/ROXICODONE) 5 MG immediate release tablet Take 1 tablet (5 mg total) by mouth 2 (two) times daily. As needed for sever pain. 03/27/15  Yes Jackolyn Confer, MD  pantoprazole (PROTONIX) 40 MG tablet Take 1 tablet (40 mg total) by mouth 2 (two) times daily. 05/08/15  Yes Jackolyn Confer, MD  amoxicillin-clavulanate (AUGMENTIN) 875-125 MG tablet Take 1 tablet by mouth 2 (two) times daily. 08/13/15   Frederich Cha, MD  chlorpheniramine-HYDROcodone [facility]) 10-8 MG/5ML SUER Take 5 mLs by mouth every 12 (twelve) hours as needed for cough. 08/13/15   Frederich Cha, MD  fexofenadine-pseudoephedrine (ALLEGRA-D ALLERGY & CONGESTION) 180-240 MG 24 hr tablet Take 1 tablet by mouth daily. 08/13/15   Frederich Cha, MD  prochlorperazine (COMPAZINE) 10 MG tablet Take 10 mg by mouth every 6 (six) hours as needed.    Historical Provider, MD  ranitidine (ZANTAC) 300 MG tablet Take 1 tablet (300 mg total) by mouth daily. 06/02/14   Jackolyn Confer, MD  tamoxifen (NOLVADEX) 20 MG tablet Take 20 mg by mouth daily.    Historical Provider, MD   Meds Ordered and Administered this Visit  Medications - No data to display  BP 120/70 mmHg  Pulse 95  Temp(Src) 98.6 F (37 C)  (Oral)  Resp 16  Ht 5\' 4"  (1.626 m)  Wt 185 lb (83.915 kg)  BMI 31.74 kg/m2  SpO2 98% No data found.   Physical Exam  Constitutional: She is oriented to person, place, and time. She appears well-developed and well-nourished.  HENT:  Head: Normocephalic and atraumatic.  Eyes: Conjunctivae are normal. Pupils are equal, round, and reactive to light.  Neck: Normal range of motion. Neck supple.  Cardiovascular: Normal rate.   Pulmonary/Chest: Effort normal and breath sounds normal.  Abdominal: Soft.  Musculoskeletal: Normal range of motion.  Lymphadenopathy:    She has cervical adenopathy.  Neurological: She is alert and oriented to person, place, and time.  Skin: Skin is warm and dry.  Psychiatric: She has a normal mood and affect.  Vitals reviewed.  ED Course  Procedures (including critical care time)  Labs Review Labs Reviewed - No data to display  Imaging Review No results found.   Visual Acuity Review  Right Eye Distance:   Left Eye Distance:   Bilateral Distance:    Right Eye Near:   Left Eye Near:    Bilateral Near:         MDM   1. Acute maxillary sinusitis, recurrence not specified   2. Bronchitis   3. Right acute serous otitis media, recurrence not specified    We'll place on Augmentin 875 Allegra-D 1 tablet daily and Tussionex 1 teaspoon twice a day to help control the cough. She declined work note see PCP if not better in a week.    Frederich Cha, MD 08/13/15 [phone]

## 2015-08-13 NOTE — ED Notes (Signed)
Patient c/o left ear ache x 5 days.

## 2015-08-13 NOTE — Discharge Instructions (Signed)
Upper Respiratory Infection, Adult Most upper respiratory infections (URIs) are caused by a virus. A URI affects the nose, throat, and upper air passages. The most common type of URI is often called "the common cold." HOME CARE   Take medicines only as told by your doctor.  Gargle warm saltwater or take cough drops to comfort your throat as told by your doctor.  Use a warm mist humidifier or inhale steam from a shower to increase air moisture. This may make it easier to breathe.  Drink enough fluid to keep your pee (urine) clear or pale yellow.  Eat soups and other clear broths.  Have a healthy diet.  Rest as needed.  Go back to work when your fever is gone or your doctor says it is okay.  You may need to stay home longer to avoid giving your URI to others.  You can also wear a face mask and wash your hands often to prevent spread of the virus.  Use your inhaler more if you have asthma.  Do not use any tobacco products, including cigarettes, chewing tobacco, or electronic cigarettes. If you need help quitting, ask your doctor. GET HELP IF:  You are getting worse, not better.  Your symptoms are not helped by medicine.  You have chills.  You are getting more short of breath.  You have brown or red mucus.  You have yellow or brown discharge from your nose.  You have pain in your face, especially when you bend forward.  You have a fever.  You have puffy (swollen) neck glands.  You have pain while swallowing.  You have white areas in the back of your throat. GET HELP RIGHT AWAY IF:   You have very bad or constant:  Headache.  Ear pain.  Pain in your forehead, behind your eyes, and over your cheekbones (sinus pain).  Chest pain.  You have long-lasting (chronic) lung disease and any of the following:  Wheezing.  Long-lasting cough.  Coughing up blood.  A change in your usual mucus.  You have a stiff neck.  You have changes in  your:  Vision.  Hearing.  Thinking.  Mood. MAKE SURE YOU:   Understand these instructions.  Will watch your condition.  Will get help right away if you are not doing well or get worse.   This information is not intended to replace advice given to you by your health care provider. Make sure you discuss any questions you have with your health care provider.   Document Released: 03/04/2008 Document Revised: 01/31/2015 Document Reviewed: 12/22/2013 Elsevier Interactive Patient Education 2016 [employer]  Otitis Media, Adult Otitis media is redness, soreness, and puffiness (swelling) in the space just behind your eardrum (middle ear). It may be caused by allergies or infection. It often happens along with a cold. HOME CARE  Take your medicine as told. Finish it even if you start to feel better.  Only take over-the-counter or prescription medicines for pain, discomfort, or fever as told by your doctor.  Follow up with your doctor as told. GET HELP IF:  You have otitis media only in one ear, or bleeding from your nose, or both.  You notice a lump on your neck.  You are not getting better in 3-5 days.  You feel worse instead of better. GET HELP RIGHT AWAY IF:   You have pain that is not helped with medicine.  You have puffiness, redness, or pain around your ear.  You get a stiff  neck.  You cannot move part of your face (paralysis).  You notice that the bone behind your ear hurts when you touch it. MAKE SURE YOU:   Understand these instructions.  Will watch your condition.  Will get help right away if you are not doing well or get worse.   This information is not intended to replace advice given to you by your health care provider. Make sure you discuss any questions you have with your health care provider.   Document Released: 03/04/2008 Document Revised: 10/07/2014 Document Reviewed: 04/13/2013 Elsevier Interactive Patient Education [employer].

## 2015-08-14 ENCOUNTER — Ambulatory Visit: Payer: 59

## 2015-08-22 ENCOUNTER — Ambulatory Visit
Admission: RE | Admit: 2015-08-22 | Discharge: 2015-08-22 | Disposition: A | Discharge status: Home / Self Care | Payer: Commercial Managed Care - HMO | Source: Ambulatory Visit | Attending: Oncology | Admitting: Oncology

## 2015-08-22 DIAGNOSIS — M5186 Other intervertebral disc disorders, lumbar region: Secondary | ICD-10-CM | POA: Diagnosis not present

## 2015-08-22 DIAGNOSIS — G939 Disorder of brain, unspecified: Secondary | ICD-10-CM | POA: Insufficient documentation

## 2015-08-22 DIAGNOSIS — M545 Low back pain, unspecified: Secondary | ICD-10-CM

## 2015-08-22 DIAGNOSIS — M2428 Disorder of ligament, vertebrae: Secondary | ICD-10-CM | POA: Insufficient documentation

## 2015-08-22 DIAGNOSIS — Z853 Personal history of malignant neoplasm of breast: Secondary | ICD-10-CM | POA: Insufficient documentation

## 2015-08-22 DIAGNOSIS — R51 Headache: Secondary | ICD-10-CM | POA: Insufficient documentation

## 2015-08-22 DIAGNOSIS — H5710 Ocular pain, unspecified eye: Secondary | ICD-10-CM | POA: Diagnosis not present

## 2015-08-22 DIAGNOSIS — R519 Headache, unspecified: Secondary | ICD-10-CM

## 2015-08-22 LAB — HM DIABETES EYE EXAM

## 2015-08-22 MED ORDER — GADOBENATE DIMEGLUMINE 529 MG/ML IV SOLN
20.0000 mL | Freq: Once | INTRAVENOUS | Status: AC | PRN
  Administered 2015-08-22: 17 mL via INTRAVENOUS

## 2015-09-27 ENCOUNTER — Other Ambulatory Visit: Payer: Self-pay | Admitting: Internal Medicine

## 2015-10-06 DIAGNOSIS — Z6831 Body mass index (BMI) 31.0-31.9, adult: Secondary | ICD-10-CM | POA: Diagnosis not present

## 2015-10-06 DIAGNOSIS — C50411 Malignant neoplasm of upper-outer quadrant of right female breast: Secondary | ICD-10-CM | POA: Diagnosis not present

## 2015-10-10 ENCOUNTER — Other Ambulatory Visit: Payer: Self-pay | Admitting: Internal Medicine

## 2015-10-17 ENCOUNTER — Ambulatory Visit (INDEPENDENT_AMBULATORY_CARE_PROVIDER_SITE_OTHER): Payer: PPO | Admitting: Internal Medicine

## 2015-10-17 ENCOUNTER — Telehealth: Payer: Self-pay | Admitting: *Deleted

## 2015-10-17 ENCOUNTER — Encounter: Payer: Self-pay | Admitting: Internal Medicine

## 2015-10-17 VITALS — BP 128/72 | HR 74 | Ht 63.5 in | Wt 184.4 lb

## 2015-10-17 DIAGNOSIS — G4733 Obstructive sleep apnea (adult) (pediatric): Secondary | ICD-10-CM | POA: Diagnosis not present

## 2015-10-17 DIAGNOSIS — J0101 Acute recurrent maxillary sinusitis: Secondary | ICD-10-CM

## 2015-10-17 DIAGNOSIS — R0600 Dyspnea, unspecified: Secondary | ICD-10-CM | POA: Diagnosis not present

## 2015-10-17 DIAGNOSIS — J329 Chronic sinusitis, unspecified: Secondary | ICD-10-CM | POA: Insufficient documentation

## 2015-10-17 DIAGNOSIS — Z9989 Dependence on other enabling machines and devices: Secondary | ICD-10-CM

## 2015-10-17 MED ORDER — PREDNISONE 20 MG PO TABS: 20.0000 mg | ORAL_TABLET | Freq: Every day | ORAL | Status: DC

## 2015-10-17 MED ORDER — LEVOFLOXACIN 500 MG PO TABS
500.0000 mg | ORAL_TABLET | Freq: Every day | ORAL | Status: AC
Start: 2015-10-17 — End: 2015-10-27

## 2015-10-17 MED ORDER — CETIRIZINE HCL 10 MG PO TABS: 10.0000 mg | ORAL_TABLET | Freq: Every day | ORAL | Status: DC

## 2015-10-17 NOTE — Assessment & Plan Note (Signed)
Patient with known history of sleep apnea, no followup in the past 7 years. Current states that he would AHI 14.3, supine AHI 55.1, The results were discussed with the patient, untreated subcutaneous sleep apnea could be adding to her generalized fatigue and overall dyspnea on exertion. Patient is adamant about not having a split-night study, we have agreed to give a AutoPap trial 5-15.   OSA  Encouraged proper weight management.  Excessive weight may contribute to snoring.  Monitor sedative use.  Discussed driving precautions and its relationship with hypersomnolence.  Discussed operating dangerous equipment and its relationship with hypersomnolence.  Discussed sleep hygiene, and benefits of a fixed sleep waked time.  The importance of getting eight or more hours of sleep discussed with patient.  Discussed limiting the use of the computer and television before bedtime.  Decrease naps during the day, so night time sleep will become enhanced.  Limit caffeine, and sleep deprivation.  HTN, stroke, and heart failure are potential risk factors.     Plan: AHI 14.3, supine AHI 55, AutoPap-5-15 cm of water, goal cpap use is 6-8 hrs per night

## 2015-10-17 NOTE — Progress Notes (Signed)
MRN# [id number] Kristina Mccoy Sep 13, 1956   CC:" Follow-up of my tests for shortness of breath" Chief Complaint  Patient presents with  . Follow-up    pt. states she is still having SOB. dry cough X28mo wheezing. denies chest pain/tightness. started augmentin and tussionex feels it helped. wears CPAP 5hr. nightly. pressure is good. [id number] home      Brief History: Synopsis: 60 female presenting to [facility] January 2016 for evaluation of dyspnea on exertion.  Patient has a complex medical history of breast cancer, [place] disease, fibromyalgia, obstructive sleep apnea, obesity, diabetes and hyperlipidemia. Patient states she was recently admitted to the hospital in December 2015 since then she has not been able to get back to baseline breathing, she has dyspnea at rest, which is slowly getting better. PFTs and mild restriction, RV 74, DLCO 61. Sleep study February 2016 AHI 14.3, AutoPap 5-15.   Events since last clinic visit: Patient presents today for a follow up of dyspnea.   She states since her last visit she was diagnosed with sinusitis, right sided ear infection, and bronchitis, she was given antibiotics and steroids, however she still admits to cough up productive green sputum at times, shortness of breath with mild DOE. States that she his wearing her CPAP up to 5 hours per night.  Also stated that she has some back spasms, had a recent MRI Spine that showed disc herniation.    PMHX:   Past Medical History  Diagnosis Date  . Otitis media, chronic   . Diabetes mellitus   . Fibromyalgia   . Breast cancer ([place]) 5/14    left breast  . Lymphedema of arm     right  . Hyperlipidemia   . Heart murmur   . Diverticulosis   . Peptic ulcer   . Sjogren's disease ([place])   . Breast cancer ([place]) 02/2013    Dr Laurance Flatten ([facility])   Surgical Hx:  Past Surgical History  Procedure Laterality Date  . Mastectomy Bilateral 03/23/13  . Breast surgery     Family  Hx:  Family History  Problem Relation Age of Onset  . Diabetes Mother   . Heart disease Mother   . Hyperlipidemia Mother   . Heart failure Mother   . Heart disease Brother   . Hyperlipidemia Brother   . Diabetes Other   . Colon polyps     Social Hx:   Social History  Substance Use Topics  . Smoking status: Former Smoker -- 0.50 packs/day    Types: Cigarettes    Quit date: 02/28/2013  . Smokeless tobacco: Never Used  . Alcohol Use: No   Medication:   Current Outpatient Rx  Name  Route  Sig  Dispense  Refill  . albuterol (PROVENTIL HFA;VENTOLIN HFA) 108 (90 BASE) MCG/ACT inhaler   Inhalation   Inhale 2 puffs into the lungs every 6 (six) hours as needed for wheezing or shortness of breath.   1 Inhaler   2   . ALPRAZolam (XANAX) 0.5 MG tablet      TAKE ONE TABLET BY MOUTH FOUR TIMES A DAY AS NEEDED FOR ANXIETY   120 tablet   3   . cetirizine (ZYRTEC) 10 MG tablet   Oral   Take 10 mg by mouth 2 (two) times daily.         . clobetasol ointment (TEMOVATE) 0.05 %   Topical   Apply 1 application topically 2 (two) times daily.         Marland Kitchen  cyclobenzaprine (FLEXERIL) 5 MG tablet   Oral   Take 1 tablet (5 mg total) by mouth 3 (three) times daily as needed for muscle spasms.   30 tablet   1   . Emollient (CERAVE) CREA   Apply externally   Apply topically.         . fexofenadine-pseudoephedrine (ALLEGRA-D ALLERGY & CONGESTION) 180-240 MG 24 hr tablet   Oral   Take 1 tablet by mouth daily.   30 tablet   0   . glipiZIDE (GLUCOTROL) 5 MG tablet      Take 5 mg in the morning and 10 mg at evening   270 tablet   1   . hyoscyamine (LEVSIN, ANASPAZ) 0.125 MG tablet   Oral   Take 1 tablet (0.125 mg total) by mouth every 4 (four) hours as needed.   30 tablet   6   . Insulin Glargine (LANTUS SOLOSTAR) 100 UNIT/ML Solostar Pen   Subcutaneous   Inject 12 Units into the skin daily at 10 pm.   15 pen   3   . mometasone (NASONEX) 50 MCG/ACT nasal spray   Nasal    Place 2 sprays into the nose daily.   17 g   12   . ONE TOUCH ULTRA TEST test strip      CHECK SIX TIMES DAILY   100 each   3   . ONETOUCH DELICA LANCETS 99991111 MISC      TWICE DAILY   100 each   3   . oxyCODONE (OXY IR/ROXICODONE) 5 MG immediate release tablet   Oral   Take 1 tablet (5 mg total) by mouth 2 (two) times daily. As needed for sever pain.   60 tablet   0   . pantoprazole (PROTONIX) 40 MG tablet   Oral   Take 1 tablet (40 mg total) by mouth 2 (two) times daily.   60 tablet   3   . prochlorperazine (COMPAZINE) 10 MG tablet   Oral   Take 10 mg by mouth every 6 (six) hours as needed.         . ranitidine (ZANTAC) 300 MG tablet   Oral   Take 1 tablet (300 mg total) by mouth daily.   90 tablet   3   . tamoxifen (NOLVADEX) 20 MG tablet   Oral   Take 20 mg by mouth daily.         . cetirizine (ZYRTEC) 10 MG tablet   Oral   Take 1 tablet (10 mg total) by mouth daily.   30 tablet   2   . levofloxacin (LEVAQUIN) 500 MG tablet   Oral   Take 1 tablet (500 mg total) by mouth daily.   7 tablet   0   . predniSONE (DELTASONE) 20 MG tablet   Oral   Take 1 tablet (20 mg total) by mouth daily with breakfast.   5 tablet   0      Review of Systems: Gen:  General fatigue HEENT: Denies blurred vision, double vision, ear pain, eye pain, hearing loss, nose bleeds, sore throat.  Positive sinus pressure and nasal drainage.  Cvc:  No dizziness, chest pain or heaviness Resp:   Shortness of breath with exertion Gi: Denies swallowing difficulty, stomach pain, nausea or vomiting, diarrhea, constipation, bowel incontinence Gu:  Denies bladder incontinence, burning urine Ext:   No Joint pain, stiffness or swelling Skin: No skin rash, easy bruising or bleeding or hives Endoc:  No polyuria,  polydipsia , polyphagia or weight change Psych: No depression, insomnia or hallucinations  Other:  All other systems negative  Allergies:  Gabapentin; Buprenorphine hcl;  Carbamazepine; Methylprednisolone; Morphine and related; and Pregabalin  Physical Examination:  VS: BP 128/72 mmHg  Pulse 74  Ht 5' 3.5" (1.613 m)  Wt 184 lb 6.4 oz (83.643 kg)  BMI 32.15 kg/m2  SpO2 90%  General Appearance: No distress  HEENT: PERRLA, EOM intact, no ptosis, no other lesions noticed, mild frontal/maxillary pressure  Pulmonary:Exam: normal breath sounds., diaphragmatic excursion normal.No wheezing, No rales   Cardiovascular:@ Exam:  Normal S1,S2.  No m/r/g.     Abdomen:Exam: Benign, Soft, non-tender, No masses  Skin:   warm, no rashes, no ecchymosis  Extremities: normal, no cyanosis, clubbing, no edema, warm with normal capillary refill.   Labs results:  BMP Lab Results  Component Value Date   NA 132* 07/19/2015   K 4.6 07/19/2015   CL 98 07/19/2015   CO2 27 07/19/2015   GLUCOSE 360* 07/19/2015   BUN 11 07/19/2015   CREATININE 0.88 07/19/2015     CBC CBC Latest Ref Rng 10/04/2014 09/08/2014 09/03/2014  WBC 4.0 - 10.5 K/uL 8.0 11.6(H) 11.4(H)  Hemoglobin 12.0 - 15.0 g/dL 14.5 13.8 -  Hematocrit 36.0 - 46.0 % 44.2 41.6 -  Platelets 150.0 - 400.0 K/uL 229.0 319.0 -     Rad results: (The following images and results were reviewed by Dr. Stevenson Clinch on 10/17/2015). CXR 06/16/15 CHEST 2 VIEW  COMPARISON: 03/10/2015  FINDINGS: The heart size and mediastinal contours are within normal limits. Both lungs are clear. The visualized skeletal structures are unremarkable.  IMPRESSION: No active cardiopulmonary disease.     Other: 11/30/2014 6 minute walk test-312 m/1038ft, lowest sat 99%, highest heart rate 74 Pulmonary function test: FEV1 72%, FEV1/FVC 84, RV 74, TLC 76, RV/TLC 97% ERV 24% Sleep study 11/18/2014: AHI 14.3, supine AHI 55.1  Assessment and Plan: 60 year old female with dyspnea on exertion. Dyspnea Differential diagnosis includes: Sequela of pneumonia, relation to autoimmune disease, radiation sequela, chemotherapy sequela, cardiac,  obstructive lung disease, restrictive lung disease, deconditioning  Patient with complex medical history making a single etiology for her dyspnea very difficult. However, dyspnea seems to be worse since her recent multilobar pneumonia in December of 2015. Dyspnea is most likely multifactorial in nature as sequelae of pneumonia, deconditioning, cardiac stress given a BNP of 3300 and elevated troponins, possible sequelae of radiation therapy, and lastly in effective treatment for sleep apnea.  Pulmonary function testing with no significant obstruction, she does have mild restriction with decreased ERV which could be due to body habitus.  She's having another bout of acute sinusitis, it is incompletely treated. Discussed during today's visit that while she is having active infection dyspnea workup with a high-resolution CAT scan is not feasible at this time. We will treat her active sinusitis/bronchitis and reevaluate for high-resolution CAT scan at follow-up visit  Plan - Patient consulledon diet and exercise as tolerated. -Sleep study with mild obstructive sleep apnea, cont on CPAP (AutoPap 5-15) -Treat underlying sinusitis/bronchitis then at follow-up visit reevaluate for high-resolution CAT scan to evaluate for underlying ILD secondary to autoimmune disease    OSA on CPAP Patient with known history of sleep apnea, no followup in the past 7 years. Current states that he would AHI 14.3, supine AHI 55.1, The results were discussed with the patient, untreated subcutaneous sleep apnea could be adding to her generalized fatigue and overall dyspnea on exertion. Patient is adamant about  not having a split-night study, we have agreed to give a AutoPap trial 5-15.   OSA  Encouraged proper weight management.  Excessive weight may contribute to snoring.  Monitor sedative use.  Discussed driving precautions and its relationship with hypersomnolence.  Discussed operating dangerous equipment and its  relationship with hypersomnolence.  Discussed sleep hygiene, and benefits of a fixed sleep waked time.  The importance of getting eight or more hours of sleep discussed with patient.  Discussed limiting the use of the computer and television before bedtime.  Decrease naps during the day, so night time sleep will become enhanced.  Limit caffeine, and sleep deprivation.  HTN, stroke, and heart failure are potential risk factors.     Plan: AHI 14.3, supine AHI 55, AutoPap-5-15 cm of water, goal cpap use is 6-8 hrs per night        Sinusitis nasal Patient with nasal drainage, productive cough, and incompletely treated recurrent sinusitis-most likely maxillary, frontal.  Plan: -Levaquin 500 mg, 1 tab daily 10 days -Prednisone 20 mg daily 1 tablet daily with breakfast 5 days -Zyrtec 10 mg daily, for the next 3 months -Continue with as needed albuterol -Continue with allergy avoidance.    Updated Medication List Outpatient Encounter Prescriptions as of 10/17/2015  Medication Sig  . albuterol (PROVENTIL HFA;VENTOLIN HFA) 108 (90 BASE) MCG/ACT inhaler Inhale 2 puffs into the lungs every 6 (six) hours as needed for wheezing or shortness of breath.  . ALPRAZolam (XANAX) 0.5 MG tablet TAKE ONE TABLET BY MOUTH FOUR TIMES A DAY AS NEEDED FOR ANXIETY  . cetirizine (ZYRTEC) 10 MG tablet Take 10 mg by mouth 2 (two) times daily.  . clobetasol ointment (TEMOVATE) [id number] % Apply 1 application topically 2 (two) times daily.  . cyclobenzaprine (FLEXERIL) 5 MG tablet Take 1 tablet (5 mg total) by mouth 3 (three) times daily as needed for muscle spasms.  . Emollient (CERAVE) CREA Apply topically.  . fexofenadine-pseudoephedrine (ALLEGRA-D ALLERGY & CONGESTION) 180-240 MG 24 hr tablet Take 1 tablet by mouth daily.  Marland Kitchen glipiZIDE (GLUCOTROL) 5 MG tablet Take 5 mg in the morning and 10 mg at evening  . hyoscyamine (LEVSIN, ANASPAZ) 0.125 MG tablet Take 1 tablet (0.125 mg total) by mouth every 4 (four)  hours as needed.  . Insulin Glargine (LANTUS SOLOSTAR) 100 UNIT/ML Solostar Pen Inject 12 Units into the skin daily at 10 pm.  . mometasone (NASONEX) 50 MCG/ACT nasal spray Place 2 sprays into the nose daily.  . ONE TOUCH ULTRA TEST test strip CHECK SIX TIMES DAILY  . ONETOUCH DELICA LANCETS 99991111 MISC TWICE DAILY  . oxyCODONE (OXY IR/ROXICODONE) 5 MG immediate release tablet Take 1 tablet (5 mg total) by mouth 2 (two) times daily. As needed for sever pain.  . pantoprazole (PROTONIX) 40 MG tablet Take 1 tablet (40 mg total) by mouth 2 (two) times daily.  . prochlorperazine (COMPAZINE) 10 MG tablet Take 10 mg by mouth every 6 (six) hours as needed.  . ranitidine (ZANTAC) 300 MG tablet Take 1 tablet (300 mg total) by mouth daily.  . tamoxifen (NOLVADEX) 20 MG tablet Take 20 mg by mouth daily.  . cetirizine (ZYRTEC) 10 MG tablet Take 1 tablet (10 mg total) by mouth daily.  Marland Kitchen levofloxacin (LEVAQUIN) 500 MG tablet Take 1 tablet (500 mg total) by mouth daily.  . predniSONE (DELTASONE) 20 MG tablet Take 1 tablet (20 mg total) by mouth daily with breakfast.  . [DISCONTINUED] amoxicillin-clavulanate (AUGMENTIN) 875-125 MG tablet Take 1 tablet by  mouth 2 (two) times daily. (Patient not taking: Reported on 10/17/2015)  . [DISCONTINUED] chlorpheniramine-HYDROcodone (TUSSIONEX PENNKINETIC ER) 10-8 MG/5ML SUER Take 5 mLs by mouth every 12 (twelve) hours as needed for cough. (Patient not taking: Reported on 10/17/2015)   No facility-administered encounter medications on file as of 10/17/2015.    Orders for this visit: No orders of the defined types were placed in this encounter.    Thank  you for the visitation and for allowing  [facility] to assist in the care of your patient. Our recommendations are noted above.  Please contact us if we can be of further service.  Vilinda Boehringer, MD [facility] Office Number: [phone]

## 2015-10-17 NOTE — Telephone Encounter (Signed)
Spoke with pt and answered her questions. Nothing further needed.

## 2015-10-17 NOTE — Telephone Encounter (Signed)
Pt calling stating on the paperwork she got, the "reason for visit" is wrong. Needs [place] to change it.  Please advise.

## 2015-10-17 NOTE — Assessment & Plan Note (Signed)
Differential diagnosis includes: Sequela of pneumonia, relation to autoimmune disease, radiation sequela, chemotherapy sequela, cardiac, obstructive lung disease, restrictive lung disease, deconditioning  Patient with complex medical history making a single etiology for her dyspnea very difficult. However, dyspnea seems to be worse since her recent multilobar pneumonia in December of 2015. Dyspnea is most likely multifactorial in nature as sequelae of pneumonia, deconditioning, cardiac stress given a BNP of 3300 and elevated troponins, possible sequelae of radiation therapy, and lastly in effective treatment for sleep apnea.  Pulmonary function testing with no significant obstruction, she does have mild restriction with decreased ERV which could be due to body habitus.  She's having another bout of acute sinusitis, it is incompletely treated. Discussed during today's visit that while she is having active infection dyspnea workup with a high-resolution CAT scan is not feasible at this time. We will treat her active sinusitis/bronchitis and reevaluate for high-resolution CAT scan at follow-up visit  Plan - Patient consulledon diet and exercise as tolerated. -Sleep study with mild obstructive sleep apnea, cont on CPAP (AutoPap 5-15) -Treat underlying sinusitis/bronchitis then at follow-up visit reevaluate for high-resolution CAT scan to evaluate for underlying ILD secondary to autoimmune disease

## 2015-10-17 NOTE — Assessment & Plan Note (Signed)
Patient with nasal drainage, productive cough, and incompletely treated recurrent sinusitis-most likely maxillary, frontal.  Plan: -Levaquin 500 mg, 1 tab daily 10 days -Prednisone 20 mg daily 1 tablet daily with breakfast 5 days -Zyrtec 10 mg daily, for the next 3 months -Continue with as needed albuterol -Continue with allergy avoidance.

## 2015-10-17 NOTE — Patient Instructions (Addendum)
Follow up with Dr. Stevenson Clinch in: 3 months - cont with current inhalers - levaquin 500 mg, 1 tab daily x 7 days - prednisone 20mg , 1 tab daily x 5 days - Zyrtec 10mg , 1 tab daily for the next 3 months.  - cont with exercise as tolerated - cont with diet and weight loss - cont with wearing your cpap night - goal is 6-8 hours per night.

## 2015-10-19 ENCOUNTER — Ambulatory Visit (INDEPENDENT_AMBULATORY_CARE_PROVIDER_SITE_OTHER): Payer: PPO | Admitting: Internal Medicine

## 2015-10-19 ENCOUNTER — Encounter: Payer: Self-pay | Admitting: Internal Medicine

## 2015-10-19 VITALS — BP 120/82 | HR 77 | Temp 98.3°F | Resp 20 | Ht 64.0 in | Wt 184.0 lb

## 2015-10-19 DIAGNOSIS — E1165 Type 2 diabetes mellitus with hyperglycemia: Secondary | ICD-10-CM | POA: Diagnosis not present

## 2015-10-19 DIAGNOSIS — Z794 Long term (current) use of insulin: Secondary | ICD-10-CM

## 2015-10-19 DIAGNOSIS — G4700 Insomnia, unspecified: Secondary | ICD-10-CM | POA: Diagnosis not present

## 2015-10-19 DIAGNOSIS — M545 Low back pain, unspecified: Secondary | ICD-10-CM

## 2015-10-19 DIAGNOSIS — E1140 Type 2 diabetes mellitus with diabetic neuropathy, unspecified: Secondary | ICD-10-CM | POA: Diagnosis not present

## 2015-10-19 DIAGNOSIS — IMO0002 Reserved for concepts with insufficient information to code with codable children: Secondary | ICD-10-CM

## 2015-10-19 LAB — COMPREHENSIVE METABOLIC PANEL
ALT: 28 U/L (ref 0–35)
AST: 23 U/L (ref 0–37)
Albumin: 4.1 g/dL (ref 3.5–5.2)
Alkaline Phosphatase: 98 U/L (ref 39–117)
BUN: 14 mg/dL (ref 6–23)
CO2: 29 mEq/L (ref 19–32)
Calcium: 9.5 mg/dL (ref 8.4–10.5)
Chloride: 99 mEq/L (ref 96–112)
Creatinine, Ser: 0.88 mg/dL (ref 0.40–1.20)
GFR: 70.21 mL/min (ref >60.00)
Glucose, Bld: 225 mg/dL — ABNORMAL HIGH (ref 70–99)
Potassium: 4.1 mEq/L (ref 3.5–5.1)
Sodium: 134 mEq/L — ABNORMAL LOW (ref 135–145)
Total Bilirubin: 0.4 mg/dL (ref 0.2–1.2)
Total Protein: 7.4 g/dL (ref 6.0–8.3)

## 2015-10-19 LAB — LIPID PANEL
Cholesterol: 216 mg/dL — ABNORMAL HIGH (ref 0–200)
HDL: 45 mg/dL (ref >39.00)
LDL Cholesterol: 146 mg/dL — ABNORMAL HIGH (ref 0–99)
NonHDL: 171.49
Total CHOL/HDL Ratio: 5
Triglycerides: 128 mg/dL (ref 0.0–149.0)
VLDL: 25.6 mg/dL (ref 0.0–40.0)

## 2015-10-19 LAB — MICROALBUMIN / CREATININE URINE RATIO
Creatinine,U: 24 mg/dL
Microalb Creat Ratio: 2.9 mg/g (ref 0.0–30.0)
Microalb, Ur: <0.7 mg/dL (ref 0.0–1.9)

## 2015-10-19 LAB — HEMOGLOBIN A1C: Hgb A1c MFr Bld: 8.8 % — ABNORMAL HIGH (ref 4.6–6.5)

## 2015-10-19 MED ORDER — TRAZODONE HCL 50 MG PO TABS: 25.0000 mg | ORAL_TABLET | Freq: Every evening | ORAL | Status: DC | PRN

## 2015-10-19 MED ORDER — DULOXETINE HCL 30 MG PO CPEP: 30.0000 mg | ORAL_CAPSULE | Freq: Every day | ORAL | Status: DC

## 2015-10-19 NOTE — Assessment & Plan Note (Signed)
Recent insomnia. No improvement with OTC Benadryl. Will add Trazodone 25-50mg  daily at bedtime. Discussed the potential risks and benefits of Trazodone. Follow up 4 weeks and prn.

## 2015-10-19 NOTE — Patient Instructions (Addendum)
Start Cymbalta 30mg  daily.  Try adding Tumeric.  We will call you to set up physical therapy.   Start Trazodone 25-50mg  at bedtime to help with sleep.

## 2015-10-19 NOTE — Assessment & Plan Note (Signed)
Chronic low back pain. Reviewed MRI lumbar spine which showed some DJD, but no severe spinal stenosis or nerve compression. Recommended physical therapy. She has been intolerant of numerous pain medications. Will add Cymbalta 30mg  daily. Discussed potential benefits of tumeric. Continue prn Flexeril.

## 2015-10-19 NOTE — Assessment & Plan Note (Signed)
She reports BG markedly elevated on recent courses of Prednisone and Dexamethasone. Will check A1c with labs today. We discussed adding coverage for future course of prednisone.

## 2015-10-19 NOTE — Progress Notes (Signed)
Pre visit review using our clinic review tool, if applicable. No additional management support is needed unless otherwise documented below in the visit note. 

## 2015-10-19 NOTE — Progress Notes (Signed)
Subjective:    Patient ID: Kristina Mccoy, female    DOB: 1956/03/02, 60 y.o.   MRN: [id number]  HPI  60YO female presents for follow up.  Seen by Dr. Stevenson Clinch earlier this week. Started on Levaquin for sinusitis and bronchitis. Seen at urgent care 3 times this fall for this. Plan was to do a high resolution CT to look for ILD.  Low back pain - Cannot sit or stand for long periods. Taking Oxycodone on occasion for pain with minimal improvement.  Flexeril helps some with muscle spasms.  Insomnia - Able to fall asleep but then wakes after 2 hours and cannot get back to sleep. Tried over the counter Equate with no improvement.  DM - BG have been elevated. Near 200- 300 at times. More elevated when on prednisone. Several recent course of prednisone recently.   Wt Readings from Last 3 Encounters:  10/19/15 184 lb (83.462 kg)  10/17/15 184 lb 6.4 oz (83.643 kg)  08/13/15 185 lb (83.915 kg)   BP Readings from Last 3 Encounters:  10/19/15 120/82  10/17/15 128/72  08/13/15 120/70    Past Medical History  Diagnosis Date  . Otitis media, chronic   . Diabetes mellitus   . Fibromyalgia   . Breast cancer ([place]) 5/14    left breast  . Lymphedema of arm     right  . Hyperlipidemia   . Heart murmur   . Diverticulosis   . Peptic ulcer   . Sjogren's disease ([place])   . Breast cancer ([place]) 02/2013    Dr Laurance Flatten ([facility])   Family History  Problem Relation Age of Onset  . Diabetes Mother   . Heart disease Mother   . Hyperlipidemia Mother   . Heart failure Mother   . Heart disease Brother   . Hyperlipidemia Brother   . Diabetes Other   . Colon polyps     Past Surgical History  Procedure Laterality Date  . Mastectomy Bilateral 03/23/13  . Breast surgery     Social History   Social History  . Marital Status: Single    Spouse Name: N/A  . Number of Children: N/A  . Years of Education: N/A   Occupational History  . disabled    Social History Main Topics  . Smoking  status: Former Smoker -- 0.50 packs/day    Types: Cigarettes    Quit date: 02/28/2013  . Smokeless tobacco: Never Used  . Alcohol Use: No  . Drug Use: No  . Sexual Activity: Not Asked   Other Topics Concern  . None   Social History Narrative    Review of Systems  Constitutional: Positive for fatigue. Negative for fever, chills and unexpected weight change.  HENT: Positive for congestion and ear pain. Negative for ear discharge, facial swelling, hearing loss, mouth sores, nosebleeds, postnasal drip, rhinorrhea, sinus pressure, sneezing, sore throat, tinnitus, trouble swallowing and voice change.   Eyes: Negative for pain, discharge, redness and visual disturbance.  Respiratory: Positive for shortness of breath. Negative for cough, chest tightness, wheezing and stridor.   Cardiovascular: Negative for chest pain, palpitations and leg swelling.  Gastrointestinal: Negative for nausea, vomiting, abdominal pain, diarrhea, constipation and abdominal distention.  Musculoskeletal: Positive for myalgias and arthralgias. Negative for neck pain and neck stiffness.  Skin: Negative for color change and rash.  Neurological: Negative for dizziness, weakness, light-headedness and headaches.  Hematological: Negative for adenopathy.  Psychiatric/Behavioral: Positive for sleep disturbance and dysphoric mood. The patient is nervous/anxious.  Objective:    BP 120/82 mmHg  Pulse 77  Temp(Src) 98.3 F (36.8 C) (Oral)  Resp 20  Ht 5\' 4"  (1.626 m)  Wt 184 lb (83.462 kg)  BMI 31.57 kg/m2  SpO2 97% Physical Exam  Constitutional: She is oriented to person, place, and time. She appears well-developed and well-nourished. No distress.  HENT:  Head: Normocephalic and atraumatic.  Right Ear: Tympanic membrane and external ear normal.  Left Ear: External ear normal. Tympanic membrane is injected.  Nose: Nose normal.  Mouth/Throat: Oropharynx is clear and moist. No oropharyngeal exudate.  Eyes:  Conjunctivae are normal. Pupils are equal, round, and reactive to light. Right eye exhibits no discharge. Left eye exhibits no discharge. No scleral icterus.  Neck: Normal range of motion. Neck supple. No tracheal deviation present. No thyromegaly present.  Cardiovascular: Normal rate, regular rhythm, normal heart sounds and intact distal pulses.  Exam reveals no gallop and no friction rub.   No murmur heard. Pulmonary/Chest: Effort normal. No accessory muscle usage. No tachypnea. No respiratory distress. She has no decreased breath sounds. She has no wheezes. She has no rhonchi. She has no rales. She exhibits no tenderness.  Musculoskeletal: Normal range of motion. She exhibits no edema or tenderness.  Lymphadenopathy:    She has no cervical adenopathy.  Neurological: She is alert and oriented to person, place, and time. No cranial nerve deficit. She exhibits normal muscle tone. Coordination normal.  Skin: Skin is warm and dry. No rash noted. She is not diaphoretic. No erythema. No pallor.  Psychiatric: She has a normal mood and affect. Her behavior is normal. Judgment and thought content normal.          Assessment & Plan:   Problem List Items Addressed This Visit      Unprioritized   Diabetes type 2, uncontrolled ([place])    She reports BG markedly elevated on recent courses of Prednisone and Dexamethasone. Will check A1c with labs today. We discussed adding coverage for future course of prednisone.      Relevant Orders   Comprehensive metabolic panel   Hemoglobin A1c   Lipid panel   Microalbumin / creatinine urine ratio   Insomnia    Recent insomnia. No improvement with OTC Benadryl. Will add Trazodone 25-50mg  daily at bedtime. Discussed the potential risks and benefits of Trazodone. Follow up 4 weeks and prn.      Relevant Medications   traZODone (DESYREL) 50 MG tablet   Midline low back pain without sciatica - Primary    Chronic low back pain. Reviewed MRI lumbar spine which  showed some DJD, but no severe spinal stenosis or nerve compression. Recommended physical therapy. She has been intolerant of numerous pain medications. Will add Cymbalta 30mg  daily. Discussed potential benefits of tumeric. Continue prn Flexeril.      Relevant Medications   DULoxetine (CYMBALTA) 30 MG capsule   Other Relevant Orders   Ambulatory referral to Physical Therapy       Return in about 4 weeks (around 11/16/2015) for Recheck.

## 2015-10-20 ENCOUNTER — Encounter: Payer: Self-pay | Admitting: Internal Medicine

## 2015-10-23 ENCOUNTER — Other Ambulatory Visit: Payer: Self-pay | Admitting: Internal Medicine

## 2015-10-23 NOTE — Telephone Encounter (Signed)
Received refill electronically Last refill 11/07/14 #30/6 Last office visit 10/19/15 Is it okay to refill?

## 2015-10-24 ENCOUNTER — Ambulatory Visit: Payer: PPO | Admitting: Physical Therapy

## 2015-10-26 ENCOUNTER — Ambulatory Visit: Payer: PPO | Admitting: Physical Therapy

## 2015-10-27 ENCOUNTER — Other Ambulatory Visit: Payer: Self-pay | Admitting: Internal Medicine

## 2015-10-31 ENCOUNTER — Ambulatory Visit: Payer: PPO | Attending: Internal Medicine | Admitting: Physical Therapy

## 2015-10-31 DIAGNOSIS — M545 Low back pain: Secondary | ICD-10-CM | POA: Insufficient documentation

## 2015-10-31 DIAGNOSIS — G8929 Other chronic pain: Secondary | ICD-10-CM | POA: Insufficient documentation

## 2015-10-31 DIAGNOSIS — R531 Weakness: Secondary | ICD-10-CM | POA: Diagnosis not present

## 2015-10-31 NOTE — Patient Instructions (Signed)
All exercises provided were adapted from [email or web address].com. Patient was provided a written handout with pictures as described. Any additional cues were manually entered in to handout and copied in to this document.  On a couch or your bed (a soft surface) bring your shoulders and trunk forward and then back to upright (not leaning backwards). Complete 3 on day 1, attempt 4 on day 2 if you can complete without increase in back pain.   WALKER HIP ABDUCTION - FWW ABDUCTIONS  While standing up using a walker, raise your leg out to the side. Keep your knee straight and maintain your toes pointed forward the entire time.   Use your arms for support and balance and have a chair behind you for safety.   Supine bridge  Lie down on your back and bend your knees so that your feet are flat on the table. Press your heels into the ground to lift your hips off of the table. You should stabilize through your core. Return to the starting position controlling your speed.      ** Keep your toes up, not flat like in this picture.

## 2015-10-31 NOTE — Therapy (Signed)
[facility] [address], [place], [address] Phone: [phone]   Fax:  [phone]  Physical Therapy Evaluation  Patient Details  Name: Kristina Mccoy MRN: [id number] Date of Birth: [birth year] No Data Recorded  Encounter Date: 10/31/2015      PT End of Session - 10/31/15 1225    Visit Number 1   Number of Visits 13   Date for PT Re-Evaluation 12/19/15   Authorization Type 1   Authorization Time Period 10   PT Start Time 1022   PT Stop Time 1130   PT Time Calculation (min) 68 min   Activity Tolerance Patient tolerated treatment well;Patient limited by pain   Behavior During Therapy [facility] for tasks assessed/performed;Flat affect      Past Medical History  Diagnosis Date  . Otitis media, chronic   . Diabetes mellitus   . Fibromyalgia   . Breast cancer ([place]) 5/14    left breast  . Lymphedema of arm     right  . Hyperlipidemia   . Heart murmur   . Diverticulosis   . Peptic ulcer   . Sjogren's disease ([place])   . Breast cancer ([place]) 02/2013    Dr Laurance Flatten ([facility])    Past Surgical History  Procedure Laterality Date  . Mastectomy Bilateral 03/23/13  . Breast surgery      There were no vitals filed for this visit.  Visit Diagnosis:  Chronic bilateral low back pain without sciatica - Plan: PT plan of care cert/re-cert  Generalized weakness - Plan: PT plan of care cert/re-cert      Subjective Assessment - 10/31/15 1557    Subjective Patient reports she has been having a weakness and painful sensation in her lower back. This was alleviated somewhat by "back brace". She describes the pain as radiating to her posterior hips and describes muscle spasms around her distal R ribcage with forward bending. She denies any neurologic symptoms, aside from some numbness in joints of the hands/fingers which she attributes to chemoitherpy.    Pertinent History Patient has a history of auto-immune disorders  and has received both chemotherapy as well as radiation for breast cancer in 2015. Fibromyalgia.    Limitations Sitting;Standing;Walking   Diagnostic tests MRI impression describes mild-moderate stenosis, no fractures some thickening of ligamentum flavum.    Patient Stated Goals To ambulate further, decrease her pain levels, and tolerate ADLs.    Currently in Pain? Yes   Pain Score 10-Worst pain ever   Pain Location Back   Pain Orientation Posterior;Lower;Right;Left   Pain Descriptors / Indicators Aching;Cramping  Weakness   Pain Type Chronic pain   Pain Radiating Towards Bilateral hips and plantar surface of her feet R > L    Pain Onset More than a month ago   Pain Frequency Constant   Aggravating Factors  Flexing forwards   Pain Relieving Factors Laying on her R side.             [facility] PT Assessment - 10/31/15 1232    Assessment   Medical Diagnosis --  Midline low back pain w/o sciatica   Referring Provider --  Ronette Deter   Precautions   Precautions None   Restrictions   Weight Bearing Restrictions No   Balance Screen   Has the patient fallen in the past 6 months No   Has the patient had a decrease in activity level because of a fear of falling?  Yes   Prior Function  Level of Independence Independent   Vocation On disability   Cognition   Overall Cognitive Status --  Patient reports she is forgetful (a result of chemotherapy)   Observation/Other Assessments   Modified [place] 86   Sensation   Light Touch --  Reports of peripheral numbness in fingers/toes, not examined   Sit to Stand   Comments --  Initially painful with ascent in her low back, hinge point    Strength   Right Hip Flexion 5/5   Right Hip External Rotation  5/5   Left Hip Flexion 5/5   Left Hip External Rotation 5/5   Right Knee Flexion 5/5   Right Knee Extension 4/5   Left Knee Flexion 5/5   Left Knee Extension 4+/5   Right Ankle Dorsiflexion 5/5   Right Ankle Plantar Flexion 5/5    Right Ankle Inversion 5/5   Right Ankle Eversion 5/5   Left Ankle Dorsiflexion 5/5   Left Ankle Plantar Flexion 5/5   Left Ankle Inversion 5/5   Left Ankle Eversion 5/5   FABER test   findings Positive   Side Right   Comment --  Positive bilaterally R>L   Straight Leg Raise   Findings Negative   Comment --  Notes pain in lower back, no radicular symptoms associated   Hip Scouring   Findings Negative       TherEx  Supine bridging 2 bouts x 5 repetitions with significant cuing prior to working sets. Cued for stance width, pushing through heels only (had been feeling in lower back) and using arms to push through table (had been feeling in neck). Able to complete with appropriate hip extensor activation after  TA contractions for posterior/anterior pelvic tilting, initially attempted in standing and patient reported LBP, regressed to supine and even with therapist guiding movement patient reported LBP (cuing for bringing belly button to spine ineffective) Rolling anteriorly/posteriorly on red there-ex ball to engage TA and rectus musculature, unable to complete more than 3 at a time prior to back pain setting in. For first 3 repetitions, appropriate TA/rectus activation and no back pain. 3 sets performed.  Standing hip abductions with cuing for bringing her trunk close to support surface with decreased ROM for hip abductions, able to properly isolate hip abductors for 1 set x 10 bilaterally (initially compensating through lumbar paraspinals). -- Noted Trendelenburg in gait, particularly to her R (compensatory side flexion of R trunk in R stance).                     PT Education - 10/31/15 1224    Education provided Yes   Education Details Findings, plan of care, HEP, rationale for choice of treatments.    Person(s) Educated Patient   Methods Explanation;Demonstration;Handout   Comprehension Verbalized understanding;Returned demonstration          PT Short Term Goals  - 10/31/15 1606    PT SHORT TERM GOAL #1   Title Patient will report an ODI score of less than 60% disability to demonstrate improved tolerance for functional mobility.    Baseline 86%   Time 3   Period Weeks   Status New           PT Long Term Goals - 10/31/15 1607    PT LONG TERM GOAL #1   Title Patient will report an ODI score of less than 60% disability to demonstrate improved tolerance for functional mobility.    Baseline 86% at baseline.    Time  6   Period Weeks   Status Deferred   PT LONG TERM GOAL #2   Title Patient will report low back pain symtpoms at worst are 4/10 pain for 1 week to demonstrate improved tolerance for functional mobility.    Baseline 10/10   Time 6   Period Days   Status New   PT LONG TERM GOAL #3   Title Patient will lift a 5# object off the floor without assistance and no more than 1/10 increase in pain to perform ADLs without assistance.    Baseline Unable to lift anything off the floor secondary to pain.    Time 6   Period Weeks   Status New   PT LONG TERM GOAL #4   Title Patient will walk down her driveway to get mail with no increase in pain to demonstrate improved tolerance for ambulation.    Baseline Patient reports pain with ambulation and at rest.    Time 6   Period Weeks   Status New               Plan - Nov 09, 2015 1227    Clinical Impression Statement Patient presents with what appears to be central sensitization as she reports pain in her legs, neck, flank, and midline lower back. She has a history of fibromyalgia and breast cancer treated with chemo and radiation, which she states was very painful and has led to both neuropathies and increased forgetfulness. She demonstrates very poor core control (unable to activate TA in supine with lumbar pain) and alterted NM control (requires extensive education with bridging to feel activation of posterior chain in LEs). Patient would benefit from skilled PT services to address her lower  back pain, which is interfering with her ability to complete ADLs at home such as picking objects off the floor.    Pt will benefit from skilled therapeutic intervention in order to improve on the following deficits Abnormal gait;Decreased activity tolerance;Decreased balance;Decreased mobility;Decreased strength;Pain;Increased muscle spasms;Decreased endurance;Difficulty walking   Rehab Potential Fair   Clinical Impairments Affecting Rehab Potential Multiple co-morbidities, history of breast cancer, history of neck pain, chronic regional pain.    PT Frequency 2x / week   PT Duration 6 weeks   PT Treatment/Interventions Aquatic Therapy;Therapeutic exercise;Therapeutic activities;Cryotherapy;Stair training;Gait training;Manual techniques;Taping   PT Next Visit Plan Progress core stabilization activities and NM re-education to activate TA (not lumbar paraspinals).    PT Home Exercise Plan See patient instructions.    Consulted and Agree with Plan of Care Patient          G-Codes - 11-09-15 1226    Functional Assessment Tool Used Clinical judgement, ODI   Functional Limitation Mobility: Walking and moving around   Mobility: Walking and Moving Around Current Status [phone]) At least 40 percent but less than 60 percent impaired, limited or restricted   Mobility: Walking and Moving Around Goal Status [phone]) At least 20 percent but less than 40 percent impaired, limited or restricted       Problem List Patient Active Problem List   Diagnosis Date Noted  . Insomnia 10/19/2015  . Right flank pain 03/27/2015  . Right shoulder pain 11/07/2014  . Midline low back pain without sciatica 11/07/2014  . OSA on CPAP 10/19/2014  . Abnormal chest CT 09/08/2014  . Anxiety 08/16/2014  . Screening for osteoporosis 07/07/2014  . Routine general medical examination at a health care facility 06/02/2014  . Allergic urticaria 06/02/2014  . Exertional dyspnea 05/27/2014  . Other fatigue  05/04/2014  .  Dyspnea 05/04/2014  . Headache(784.0) 01/24/2014  . Adjustment disorder with mixed anxiety and depressed mood 01/24/2014  . 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 Diabetes type 2, uncontrolled ([place]) 07/06/2013  . Malignant neoplasm of breast (female) ([place]) 02/28/2013  . Coagulation disorder ([place]) 12/01/2012  . Atherosclerotic peripheral vascular disease with intermittent claudication ([place]) 12/01/2012  . Palpitations 12/01/2012  . Hyperlipidemia 12/01/2012  . Chronic pain disorder 06/11/2012  . Hearing loss 09/16/2011  . Fibromyalgia 06/15/2011   Kerman Passey, PT, DPT    10/31/2015, 6:04 PM  [facility] [address], [place], [address] Phone: [phone]   Fax:  [phone]  Name: Nicholina Winschel MRN: [id number] Date of Birth: 12-27-1955

## 2015-11-02 ENCOUNTER — Ambulatory Visit: Payer: PPO | Attending: Internal Medicine | Admitting: Physical Therapy

## 2015-11-02 DIAGNOSIS — R531 Weakness: Secondary | ICD-10-CM | POA: Diagnosis not present

## 2015-11-02 DIAGNOSIS — G8929 Other chronic pain: Secondary | ICD-10-CM | POA: Diagnosis not present

## 2015-11-02 DIAGNOSIS — M545 Low back pain, unspecified: Secondary | ICD-10-CM

## 2015-11-02 NOTE — Therapy (Signed)
[name] [address] [place], [address] Phone: [phone]   Fax:  [phone]  Physical Therapy Treatment  Patient Details  Name: Kristina Mccoy MRN: [id number] Date of Birth: 1956/04/13 No Data Recorded  Encounter Date: 11/02/2015      PT End of Session - 11/02/15 1046    Visit Number 2   Number of Visits 13   Date for PT Re-Evaluation 12/19/15   Authorization Type 2   Authorization Time Period 10   PT Start Time [phone]   PT Stop Time 1030   PT Time Calculation (min) 42 min   Activity Tolerance Patient tolerated treatment well   Behavior During Therapy [facility] for tasks assessed/performed;Flat affect      Past Medical History  Diagnosis Date  . Otitis media, chronic   . Diabetes mellitus   . Fibromyalgia   . Breast cancer ([place]) 5/14    left breast  . Lymphedema of arm     right  . Hyperlipidemia   . Heart murmur   . Diverticulosis   . Peptic ulcer   . Sjogren's disease ([place])   . Breast cancer ([place]) 02/2013    Dr Laurance Flatten ([facility])    Past Surgical History  Procedure Laterality Date  . Mastectomy Bilateral 03/23/13  . Breast surgery      There were no vitals filed for this visit.  Visit Diagnosis:  Chronic bilateral low back pain without sciatica  Generalized weakness      Subjective Assessment - 11/02/15 0956    Subjective Patient reports increased pain "it was excrutiating" with HEP provided. She reports she was surprised as "I didn't even feel like I was exercising". Patient was curious if she should "work through the pain".    Pertinent History Patient has a history of auto-immune disorders and has received both chemotherapy as well as radiation for breast cancer in 2015. Fibromyalgia.    Limitations Sitting;Standing;Walking   Diagnostic tests MRI impression describes mild-moderate stenosis, no fractures some thickening of ligamentum flavum.    Patient Stated Goals To  ambulate further, decrease her pain levels, and tolerate ADLs.    Currently in Pain? Yes   Pain Score 7    Pain Location Back   Pain Orientation Right;Left;Posterior;Lower   Pain Descriptors / Indicators Aching;Cramping   Pain Type Chronic pain   Pain Radiating Towards Up to her shoulders, into both thighs.    Pain Onset More than a month ago   Pain Frequency Constant   Aggravating Factors  Flexing forward.    Pain Relieving Factors Laying on R side.       Supine trunk rotations 2 bouts x 10 repetitions in controlled ROM to initiate "core strengthening".   Soft tissue mobilization to lumbar paraspinals, superior portion of gluteals, and R/L distal flank. Patient reported minimal if any pain in this region after STM applied. Patient also reported R sided neck pain, which was relieved with STM.  Foam roller mini squats in standing on lumbar spine for ~ 30" patient reported similar to soft tissue mobilization and noted decrease in symptoms  Scapular retractions 3 sets x 10 repetitions with cuing not to shrug her shoulders, significantly reduced symptoms in her shoulders, neck, and hands.                             PT Education - 11/02/15 1045    Education provided Yes  Education Details Progressed education of pain science, to complete relaxation/meditation as possible while exercising. Discontinue previous HEP.    Person(s) Educated Patient   Methods Explanation;Demonstration;Verbal cues;Handout   Comprehension Verbalized understanding;Returned demonstration          PT Short Term Goals - 10/31/15 1606    PT SHORT TERM GOAL #1   Title Patient will report an ODI score of less than 60% disability to demonstrate improved tolerance for functional mobility.    Baseline 86%   Time 3   Period Weeks   Status New           PT Long Term Goals - 10/31/15 1607    PT LONG TERM GOAL #1   Title Patient will report an ODI score of less than 60% disability to  demonstrate improved tolerance for functional mobility.    Baseline 86% at baseline.    Time 6   Period Weeks   Status Deferred   PT LONG TERM GOAL #2   Title Patient will report low back pain symtpoms at worst are 4/10 pain for 1 week to demonstrate improved tolerance for functional mobility.    Baseline 10/10   Time 6   Period Days   Status New   PT LONG TERM GOAL #3   Title Patient will lift a 5# object off the floor without assistance and no more than 1/10 increase in pain to perform ADLs without assistance.    Baseline Unable to lift anything off the floor secondary to pain.    Time 6   Period Weeks   Status New   PT LONG TERM GOAL #4   Title Patient will walk down her driveway to get mail with no increase in pain to demonstrate improved tolerance for ambulation.    Baseline Patient reports pain with ambulation and at rest.    Time 6   Period Weeks   Status New               Plan - 11/02/15 1046    Clinical Impression Statement Patient reports significant improvement in symptoms after soft tissue mobilzation and gentle postural re-education in this session. She reports pain in her hands, thighs, shoulders, and mid-back are reduced after treatment today, reinforcing suspicion of central sensitzation. Patient educated on follow up HEP and to begin relaxation program at home. Progress manual loading of soft tissue until patient can tolerate more active loading.    Pt will benefit from skilled therapeutic intervention in order to improve on the following deficits Abnormal gait;Decreased activity tolerance;Decreased balance;Decreased mobility;Decreased strength;Pain;Increased muscle spasms;Decreased endurance;Difficulty walking   Rehab Potential Fair   Clinical Impairments Affecting Rehab Potential Multiple co-morbidities, history of breast cancer, history of neck pain, chronic regional pain.    PT Frequency 2x / week   PT Duration 6 weeks   PT Treatment/Interventions Aquatic  Therapy;Therapeutic exercise;Therapeutic activities;Cryotherapy;Stair training;Gait training;Manual techniques;Taping   PT Next Visit Plan Progress core stabilization activities and NM re-education to activate TA (not lumbar paraspinals). Manual therapy as tolerated.    PT Home Exercise Plan See patient instructions.    Consulted and Agree with Plan of Care Patient        Problem List Patient Active Problem List   Diagnosis Date Noted  . Insomnia 10/19/2015  . Right flank pain 03/27/2015  . Right shoulder pain 11/07/2014  . Midline low back pain without sciatica 11/07/2014  . OSA on CPAP 10/19/2014  . Abnormal chest CT 09/08/2014  . Anxiety 08/16/2014  .  Screening for osteoporosis 07/07/2014  . Routine general medical examination at a health care facility 06/02/2014  . Allergic urticaria 06/02/2014  . Exertional dyspnea 05/27/2014  . Other fatigue 05/04/2014  . Dyspnea 05/04/2014  . Headache(784.0) 01/24/2014  . Adjustment disorder with mixed anxiety and depressed mood 01/24/2014  . 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 Diabetes type 2, uncontrolled ([place]) 07/06/2013  . Malignant neoplasm of breast (female) ([place]) 02/28/2013  . Coagulation disorder ([place]) 12/01/2012  . Atherosclerotic peripheral vascular disease with intermittent claudication ([place]) 12/01/2012  . Palpitations 12/01/2012  . Hyperlipidemia 12/01/2012  . Chronic pain disorder 06/11/2012  . Hearing loss 09/16/2011  . Fibromyalgia 06/15/2011    Kerman Passey, PT, DPT    11/02/2015, 6:27 PM  [facility] [address], [place], [address] Phone: [phone]   Fax:  [phone]  Name: Dawnielle Tackman MRN: [id number] Date of Birth: 04-07-1956

## 2015-11-02 NOTE — Patient Instructions (Addendum)
All exercises provided were adapted from [email or web address]. Patient was provided a written handout with pictures as described. Any additional cues were manually entered in to handout and copied in to this document.  FOAM ROLL SPINE MASSAGE - STANDING  Stand with a foam roll behind your back. Slowly perform mini-squats and allow the foam roller to roll up and down your back for a self massage.     Lumbar Rotations   Lying on your back with your knees bent, slowly drop your legs to one side and hold the stretch. Come back to the middle and switch sides. You should feel the stretch in your back on the opposite side that your legs are leaning. Complete this exercise for 3 minutes.   SCAPULAR RETRACTIONS  Draw your shoulder blades back and down.

## 2015-11-07 ENCOUNTER — Encounter: Payer: PPO | Admitting: Physical Therapy

## 2015-11-07 ENCOUNTER — Encounter: Payer: Self-pay | Admitting: *Deleted

## 2015-11-08 ENCOUNTER — Telehealth: Payer: Self-pay | Admitting: Internal Medicine

## 2015-11-08 ENCOUNTER — Other Ambulatory Visit: Payer: Self-pay | Admitting: Internal Medicine

## 2015-11-08 DIAGNOSIS — R059 Cough, unspecified: Secondary | ICD-10-CM

## 2015-11-08 DIAGNOSIS — R05 Cough: Secondary | ICD-10-CM

## 2015-11-08 NOTE — Telephone Encounter (Signed)
Spoke with the patient, she is having right sided pain that the team health RN thought might be kidney stone related.  Patient is not sure as she has not had any urine issues, no blood, no burning.  Patient believes she might have pneumonia as the last time (approximately 1 year ago) she had similar pain and no cough or congestion but was diagnosis of bilateral pneumonia.  She states she has dry cough and has been taking Advil and muscle relaxer and that seems to keep it under control and manageable.   Patient states that the RN told her to go to urgent care or ED.  She called urgent care and there was a 2 hour wait.  She is not able to wait there that long.  I initially offered her a afternoon appointment with Dr. Lacinda Axon, she refused due to a ride issue.  Explained to patient that she needs to been seen and discussed possibilities for tomorrow.  Told her I would return a call to her.   Spoke with Dr. Gilford Rile, offered 415 pm, 7 am, 1 pm and 2pm appointments to patient.  Patient decided to come at 1pm tomorrow and [place] [facility] mall prior for a CXR.

## 2015-11-08 NOTE — Telephone Encounter (Signed)
Patient Name: Kristina Mccoy  DOB: [birth year]    Initial Comment caller states she has right side around to back pain   Nurse Assessment  Nurse: Mallie Mussel, RN, Alveta Heimlich Date/Time Eilene Ghazi Time): 11/08/2015 11:47:57 AM  Confirm and document reason for call. If symptomatic, describe symptoms. You must click the next button to save text entered. ---Caller states that she has right sided back pain that woke her up at 1am. She rates her pain as 10 on 0-10 scale. She denies blood in her urine. She denies burning/pain with urination.  Has the patient traveled out of the country within the last 30 days? ---No  Does the patient have any new or worsening symptoms? ---Yes  Will a triage be completed? ---Yes  Related visit to physician within the last 2 weeks? ---No  Does the PT have any chronic conditions? (i.e. diabetes, asthma, etc.) ---Yes  List chronic conditions. ---Diabetes, Auto Immune Diseases  Is this a behavioral health or substance abuse call? ---No     Guidelines    Guideline Title Affirmed Question Affirmed Notes  Flank Pain [1] SEVERE pain (e.g., excruciating, scale 8-10) AND [2] present > 1 hour    Final Disposition User   Go to ED Now Mallie Mussel, RN, Alveta Heimlich    Referrals  GO TO FACILITY UNDECIDED   Disagree/Comply: Leta Baptist

## 2015-11-09 ENCOUNTER — Encounter: Payer: Self-pay | Admitting: Internal Medicine

## 2015-11-09 ENCOUNTER — Ambulatory Visit: Payer: PPO | Admitting: Physical Therapy

## 2015-11-09 ENCOUNTER — Ambulatory Visit
Admission: RE | Admit: 2015-11-09 | Discharge: 2015-11-09 | Disposition: A | Discharge status: Home / Self Care | Payer: PPO | Source: Ambulatory Visit | Attending: Internal Medicine | Admitting: Internal Medicine

## 2015-11-09 ENCOUNTER — Ambulatory Visit (INDEPENDENT_AMBULATORY_CARE_PROVIDER_SITE_OTHER): Payer: PPO | Admitting: Internal Medicine

## 2015-11-09 VITALS — BP 127/80 | HR 81 | Ht 64.0 in | Wt 188.0 lb

## 2015-11-09 DIAGNOSIS — M549 Dorsalgia, unspecified: Secondary | ICD-10-CM | POA: Insufficient documentation

## 2015-11-09 DIAGNOSIS — R059 Cough, unspecified: Secondary | ICD-10-CM

## 2015-11-09 DIAGNOSIS — R05 Cough: Secondary | ICD-10-CM | POA: Insufficient documentation

## 2015-11-09 DIAGNOSIS — E1165 Type 2 diabetes mellitus with hyperglycemia: Secondary | ICD-10-CM

## 2015-11-09 DIAGNOSIS — M546 Pain in thoracic spine: Secondary | ICD-10-CM | POA: Diagnosis not present

## 2015-11-09 DIAGNOSIS — E1140 Type 2 diabetes mellitus with diabetic neuropathy, unspecified: Secondary | ICD-10-CM

## 2015-11-09 DIAGNOSIS — IMO0002 Reserved for concepts with insufficient information to code with codable children: Secondary | ICD-10-CM

## 2015-11-09 DIAGNOSIS — M545 Low back pain, unspecified: Secondary | ICD-10-CM | POA: Insufficient documentation

## 2015-11-09 DIAGNOSIS — Z87891 Personal history of nicotine dependence: Secondary | ICD-10-CM | POA: Insufficient documentation

## 2015-11-09 DIAGNOSIS — Z794 Long term (current) use of insulin: Secondary | ICD-10-CM

## 2015-11-09 DIAGNOSIS — M5134 Other intervertebral disc degeneration, thoracic region: Secondary | ICD-10-CM | POA: Insufficient documentation

## 2015-11-09 DIAGNOSIS — R0789 Other chest pain: Secondary | ICD-10-CM | POA: Diagnosis not present

## 2015-11-09 MED ORDER — PREDNISONE 20 MG PO TABS: 20.0000 mg | ORAL_TABLET | Freq: Every day | ORAL | Status: DC

## 2015-11-09 NOTE — Patient Instructions (Addendum)
Start Prednisone 20mg  daily for 5 days. Call if symptoms are not improving.  Monitor blood sugars, if consistently over 200 then increase Lantus to 20units daily.  Recheck 2 weeks.

## 2015-11-09 NOTE — Progress Notes (Signed)
Pre visit review using our clinic review tool, if applicable. No additional management support is needed unless otherwise documented below in the visit note. 

## 2015-11-09 NOTE — Progress Notes (Signed)
Subjective:    Patient ID: Kristina Mccoy, female    DOB: 1956-08-11, 60 y.o.   MRN: [id number]  HPI  60YO female presents for acute visit.  Tuesday morning, woke out of sleep with sever mid back pain. Slightly worse on right side. Described as stabbing, throbbing pain. Took Advil and Flexeril. No improvement with this. Tried various positions with no improvement. Pain did not ease until Wednesday night.  Now just having occasional grabbing sensation. No recent trauma to back. Had occasional dry cough. No fever. No urinary symptoms except for possible mild burning at times. No GI symptoms. Occasionally, feels short of breath, but unchanged from baseline.  DM - Increased Lantus to 15units. BG have been variable, as high as 265. Mostly in 200.  Lab Results  Component Value Date   HGBA1C 8.8* 10/19/2015     Wt Readings from Last 3 Encounters:  11/09/15 188 lb (85.276 kg)  10/19/15 184 lb (83.462 kg)  10/17/15 184 lb 6.4 oz (83.643 kg)   BP Readings from Last 3 Encounters:  11/09/15 127/80  10/19/15 120/82  10/17/15 128/72    Past Medical History  Diagnosis Date  . Otitis media, chronic   . Diabetes mellitus   . Fibromyalgia   . Breast cancer ([place]) 5/14    left breast  . Lymphedema of arm     right  . Hyperlipidemia   . Heart murmur   . Diverticulosis   . Peptic ulcer   . Sjogren's disease ([place])   . Breast cancer ([place]) 02/2013    Dr Laurance Flatten ([facility])   Family History  Problem Relation Age of Onset  . Diabetes Mother   . Heart disease Mother   . Hyperlipidemia Mother   . Heart failure Mother   . Heart disease Brother   . Hyperlipidemia Brother   . Diabetes Other   . Colon polyps     Past Surgical History  Procedure Laterality Date  . Mastectomy Bilateral 03/23/13  . Breast surgery     Social History   Social History  . Marital Status: Single    Spouse Name: N/A  . Number of Children: N/A  . Years of Education: N/A   Occupational History  .  disabled    Social History Main Topics  . Smoking status: Former Smoker -- 0.50 packs/day    Types: Cigarettes    Quit date: 02/28/2013  . Smokeless tobacco: Never Used  . Alcohol Use: No  . Drug Use: No  . Sexual Activity: Not Asked   Other Topics Concern  . None   Social History Narrative    Review of Systems  Constitutional: Positive for fatigue. Negative for fever, chills, appetite change and unexpected weight change.  Eyes: Negative for visual disturbance.  Respiratory: Positive for cough. Negative for shortness of breath.   Cardiovascular: Negative for chest pain, palpitations and leg swelling.  Gastrointestinal: Negative for nausea, vomiting, abdominal pain, diarrhea and constipation.  Genitourinary: Negative for dysuria, urgency and frequency.  Musculoskeletal: Positive for myalgias, back pain and arthralgias.  Skin: Negative for color change and rash.  Hematological: Negative for adenopathy. Does not bruise/bleed easily.  Psychiatric/Behavioral: Positive for sleep disturbance. Negative for dysphoric mood. The patient is not nervous/anxious.        Objective:    BP 127/80 mmHg  Pulse 81  Ht 5\' 4"  (1.626 m)  Wt 188 lb (85.276 kg)  BMI 32.25 kg/m2  SpO2 100% Physical Exam  Constitutional: She is oriented to person, place,  and time. She appears well-developed and well-nourished. No distress.  HENT:  Head: Normocephalic and atraumatic.  Right Ear: External ear normal.  Left Ear: External ear normal.  Nose: Nose normal.  Mouth/Throat: Oropharynx is clear and moist. No oropharyngeal exudate.  Eyes: Conjunctivae are normal. Pupils are equal, round, and reactive to light. Right eye exhibits no discharge. Left eye exhibits no discharge. No scleral icterus.  Neck: Normal range of motion. Neck supple. No tracheal deviation present. No thyromegaly present.  Cardiovascular: Normal rate, regular rhythm, normal heart sounds and intact distal pulses.  Exam reveals no gallop  and no friction rub.   No murmur heard. Pulmonary/Chest: Effort normal and breath sounds normal. No accessory muscle usage. No respiratory distress. She has no decreased breath sounds. She has no wheezes. She has no rhonchi. She has no rales. She exhibits no tenderness.  Musculoskeletal: Normal range of motion. She exhibits no edema or tenderness.       Thoracic back: She exhibits pain. She exhibits normal range of motion, no tenderness, no bony tenderness, no swelling and no deformity.       Arms: Lymphadenopathy:    She has no cervical adenopathy.  Neurological: She is alert and oriented to person, place, and time. No cranial nerve deficit. She exhibits normal muscle tone. Coordination normal.  Skin: Skin is warm and dry. No rash noted. She is not diaphoretic. No erythema. No pallor.  Psychiatric: She has a normal mood and affect. Her behavior is normal. Judgment and thought content normal.          Assessment & Plan:   Problem List Items Addressed This Visit      Unprioritized   Back pain - Primary    Mid-Right sided back pain which is now improving. Reviewed previous MRI lumbar spine which showed mild bulging discs in the lower thoracic spine and diffuse degenerative changes in the lumbar spine. Discussed some options of additional imaging. Will start 5 day prednisone burst. Follow up in 1 week. If no improvement, consider repeat MRI of thoracic spine. Continue prn Oxycodone for severe pain.      Relevant Medications   predniSONE (DELTASONE) 20 MG tablet   Diabetes type 2, uncontrolled (HCC)    BG elevated with recent courses of prednisone and planning to start another prednisone burst today. Will increase Lantus to 20units if BG consistently >200. Follow up in 1 week.          Return in about 2 weeks (around 11/23/2015) for Recheck.

## 2015-11-09 NOTE — Assessment & Plan Note (Signed)
Mid-Right sided back pain which is now improving. Reviewed previous MRI lumbar spine which showed mild bulging discs in the lower thoracic spine and diffuse degenerative changes in the lumbar spine. Discussed some options of additional imaging. Will start 5 day prednisone burst. Follow up in 1 week. If no improvement, consider repeat MRI of thoracic spine. Continue prn Oxycodone for severe pain.

## 2015-11-09 NOTE — Assessment & Plan Note (Signed)
BG elevated with recent courses of prednisone and planning to start another prednisone burst today. Will increase Lantus to 20units if BG consistently >200. Follow up in 1 week.

## 2015-11-14 ENCOUNTER — Encounter: Payer: PPO | Admitting: Physical Therapy

## 2015-11-14 ENCOUNTER — Ambulatory Visit: Payer: PPO | Admitting: Physical Therapy

## 2015-11-16 ENCOUNTER — Ambulatory Visit: Payer: PPO | Admitting: Internal Medicine

## 2015-11-16 ENCOUNTER — Encounter: Payer: PPO | Admitting: Physical Therapy

## 2015-11-21 ENCOUNTER — Ambulatory Visit: Payer: PPO | Admitting: Physical Therapy

## 2015-11-21 ENCOUNTER — Encounter: Payer: PPO | Admitting: Physical Therapy

## 2015-11-23 ENCOUNTER — Other Ambulatory Visit: Payer: Self-pay | Admitting: Internal Medicine

## 2015-11-23 ENCOUNTER — Encounter: Payer: PPO | Admitting: Physical Therapy

## 2015-11-28 ENCOUNTER — Ambulatory Visit: Payer: PPO | Admitting: Physical Therapy

## 2015-11-30 DIAGNOSIS — H9209 Otalgia, unspecified ear: Secondary | ICD-10-CM | POA: Diagnosis not present

## 2015-11-30 DIAGNOSIS — H903 Sensorineural hearing loss, bilateral: Secondary | ICD-10-CM | POA: Diagnosis not present

## 2015-11-30 DIAGNOSIS — M26601 Right temporomandibular joint disorder, unspecified: Secondary | ICD-10-CM | POA: Diagnosis not present

## 2015-12-04 ENCOUNTER — Encounter: Payer: Self-pay | Admitting: Internal Medicine

## 2015-12-04 ENCOUNTER — Ambulatory Visit (INDEPENDENT_AMBULATORY_CARE_PROVIDER_SITE_OTHER): Payer: PPO | Admitting: Internal Medicine

## 2015-12-04 VITALS — BP 126/76 | HR 83 | Ht 64.0 in | Wt 185.0 lb

## 2015-12-04 DIAGNOSIS — M546 Pain in thoracic spine: Secondary | ICD-10-CM | POA: Diagnosis not present

## 2015-12-04 DIAGNOSIS — G4700 Insomnia, unspecified: Secondary | ICD-10-CM

## 2015-12-04 DIAGNOSIS — R5382 Chronic fatigue, unspecified: Secondary | ICD-10-CM | POA: Diagnosis not present

## 2015-12-04 LAB — CBC WITH DIFFERENTIAL/PLATELET
Basophils Absolute: 0 10*3/uL (ref 0.0–0.1)
Basophils Relative: 0.6 % (ref 0.0–3.0)
Eosinophils Absolute: 0.3 10*3/uL (ref 0.0–0.7)
Eosinophils Relative: 5.1 % — ABNORMAL HIGH (ref 0.0–5.0)
HCT: 40.2 % (ref 36.0–46.0)
Hemoglobin: 13.8 g/dL (ref 12.0–15.0)
Lymphocytes Relative: 24.9 % (ref 12.0–46.0)
Lymphs Abs: 1.4 10*3/uL (ref 0.7–4.0)
MCHC: 34.3 g/dL (ref 30.0–36.0)
MCV: 89.7 fl (ref 78.0–100.0)
Monocytes Absolute: 0.6 10*3/uL (ref 0.1–1.0)
Monocytes Relative: 10.2 % (ref 3.0–12.0)
Neutro Abs: 3.4 10*3/uL (ref 1.4–7.7)
Neutrophils Relative %: 59.2 % (ref 43.0–77.0)
Platelets: 204 10*3/uL (ref 150.0–400.0)
RBC: 4.49 Mil/uL (ref 3.87–5.11)
RDW: 14.4 % (ref 11.5–15.5)
WBC: 5.7 10*3/uL (ref 4.0–10.5)

## 2015-12-04 LAB — FERRITIN: Ferritin: 111 ng/mL (ref 10.0–291.0)

## 2015-12-04 LAB — C-REACTIVE PROTEIN: CRP: 2 mg/dL (ref 0.5–20.0)

## 2015-12-04 LAB — VITAMIN B12: Vitamin B-12: 438 pg/mL (ref 211–911)

## 2015-12-04 LAB — SEDIMENTATION RATE: Sed Rate: 22 mm/hr (ref 0–22)

## 2015-12-04 LAB — RHEUMATOID FACTOR: Rhuematoid fact SerPl-aCnc: <10 IU/mL (ref <=14)

## 2015-12-04 LAB — VITAMIN D 25 HYDROXY (VIT D DEFICIENCY, FRACTURES): VITD: 18.81 ng/mL — ABNORMAL LOW (ref 30.00–100.00)

## 2015-12-04 LAB — TSH: TSH: 1.19 u[IU]/mL (ref 0.35–4.50)

## 2015-12-04 MED ORDER — TRAZODONE HCL 100 MG PO TABS: 100.0000 mg | ORAL_TABLET | Freq: Every evening | ORAL | Status: DC | PRN

## 2015-12-04 MED ORDER — DULOXETINE HCL 60 MG PO CPEP
60.0000 mg | ORAL_CAPSULE | Freq: Every day | ORAL | Status: DC
Start: 2015-12-04 — End: 2016-02-16

## 2015-12-04 NOTE — Patient Instructions (Signed)
Labs today to help determine cause of fatigue.  Increase Cymbalta to 60mg  daily.  Increase Trazodone to 100mg  at bedtime.  Follow up in 2 weeks.

## 2015-12-04 NOTE — Progress Notes (Signed)
Pre visit review using our clinic review tool, if applicable. No additional management support is needed unless otherwise documented below in the visit note. 

## 2015-12-04 NOTE — Assessment & Plan Note (Signed)
Symptoms poorly controlled. Will increase Trazodone to 100mg  daily at bedtime. Follow up 2 weeks and prn.

## 2015-12-04 NOTE — Progress Notes (Signed)
Subjective:    Patient ID: Kristina Mccoy, female    DOB: [birth year], 60 y.o.   MRN: [id number]  HPI  60YO female presents for follow up.  Last seen 2/9 for right sided thoracic back pain. Treated with prednisone burst.  States that "every day is a struggle." She is in "constant pain." Using a back brace and heating pad with some improvement. Feels exhausted al the time. Minimal improvement with prednisone. Has ear pain, neck pain, back pain. Seen by ENT, Dr. Kathyrn Sheriff. Exam was normal. Seen by 2 pain clinics with no improvement. Seen by rheumatology in past. Tried Plaquenil but had dizziness with this. Seen by psychologist in past with no improvement.  Wt Readings from Last 3 Encounters:  12/04/15 185 lb (83.915 kg)  11/09/15 188 lb (85.276 kg)  10/19/15 184 lb (83.462 kg)   BP Readings from Last 3 Encounters:  12/04/15 126/76  11/09/15 127/80  10/19/15 120/82    Past Medical History  Diagnosis Date  . Otitis media, chronic   . Diabetes mellitus   . Fibromyalgia   . Breast cancer ([place]) 5/14    left breast  . Lymphedema of arm     right  . Hyperlipidemia   . Heart murmur   . Diverticulosis   . Peptic ulcer   . Sjogren's disease ([place])   . Breast cancer ([place]) 02/2013    Dr Laurance Flatten ([facility]   Family History  Problem Relation Age of Onset  . Diabetes Mother   . Heart disease Mother   . Hyperlipidemia Mother   . Heart failure Mother   . Heart disease Brother   . Hyperlipidemia Brother   . Diabetes Other   . Colon polyps     Past Surgical History  Procedure Laterality Date  . Mastectomy Bilateral 03/23/13  . Breast surgery     Social History   Social History  . Marital Status: Single    Spouse Name: N/A  . Number of Children: N/A  . Years of Education: N/A   Occupational History  . disabled    Social History Main Topics  . Smoking status: Former Smoker -- 0.50 packs/day    Types: Cigarettes    Quit date: 02/28/2013  . Smokeless tobacco:  Never Used  . Alcohol Use: No  . Drug Use: No  . Sexual Activity: Not Asked   Other Topics Concern  . None   Social History Narrative    Review of Systems  Constitutional: Positive for fatigue. Negative for fever, chills, appetite change and unexpected weight change.  HENT: Positive for congestion and ear pain. Negative for ear discharge.   Eyes: Negative for visual disturbance.  Respiratory: Negative for cough, chest tightness, shortness of breath and wheezing.   Cardiovascular: Negative for chest pain and leg swelling.  Gastrointestinal: Negative for abdominal pain, diarrhea and constipation.  Musculoskeletal: Positive for myalgias, back pain and arthralgias.  Skin: Negative for color change and rash.  Neurological: Positive for headaches. Negative for dizziness, facial asymmetry, weakness, light-headedness and numbness.  Hematological: Negative for adenopathy. Does not bruise/bleed easily.  Psychiatric/Behavioral: Positive for sleep disturbance and dysphoric mood. Negative for suicidal ideas. The patient is not nervous/anxious.        Objective:    BP 126/76 mmHg  Pulse 83  Ht 5' 4" (1.626 m)  Wt 185 lb (83.915 kg)  BMI 31.74 kg/m2  SpO2 100% Physical Exam  Constitutional: She is oriented to person, place, and time. She appears well-developed and well-nourished. No  distress.  HENT:  Head: Normocephalic and atraumatic.  Right Ear: External ear normal. Tympanic membrane is erythematous. Tympanic membrane is not bulging. No middle ear effusion.  Left Ear: Tympanic membrane, external ear and ear canal normal.  Nose: Nose normal.  Mouth/Throat: Oropharynx is clear and moist. No oropharyngeal exudate.  Eyes: Conjunctivae are normal. Pupils are equal, round, and reactive to light. Right eye exhibits no discharge. Left eye exhibits no discharge. No scleral icterus.  Neck: Normal range of motion. Neck supple. No tracheal deviation present. No thyromegaly present.    Cardiovascular: Normal rate, regular rhythm, normal heart sounds and intact distal pulses.  Exam reveals no gallop and no friction rub.   No murmur heard. Pulmonary/Chest: Effort normal and breath sounds normal. No respiratory distress. She has no wheezes. She has no rales. She exhibits no tenderness.  Musculoskeletal: Normal range of motion. She exhibits no edema or tenderness.  Lymphadenopathy:    She has no cervical adenopathy.  Neurological: She is alert and oriented to person, place, and time. No cranial nerve deficit. She exhibits normal muscle tone. Coordination normal.  Skin: Skin is warm and dry. No rash noted. She is not diaphoretic. No erythema. No pallor.  Psychiatric: Her speech is normal and behavior is normal. Judgment and thought content normal. Her mood appears anxious. Cognition and memory are normal. She exhibits a depressed mood.          Assessment & Plan:   Problem List Items Addressed This Visit      Unprioritized   Back pain - Primary    Chronic back pain. No improvement with PT. No improvement with Prednisone. Unable to tolerate gabapentin and lyrica. No improvement with Flexeril. Itching with Oxycodone. Will increase Cymbalta to 72m daily. Recheck in 2 weeks.      Relevant Medications   DULoxetine (CYMBALTA) 60 MG capsule   Chronic fatigue    Chronic fatigue and pain. Previously diagnosed with auto-immune disorder, however could not tolerate Plaquenil. Will recheck CBC, CMP, TSH, B12, ANA, ESR, CRP. Follow up in 2 weeks.      Relevant Orders   TSH   VITAMIN D 25 Hydroxy (Vit-D Deficiency, Fractures)   CBC with Differential/Platelet   B12   Ferritin   ANA   Sed Rate (ESR)   C-reactive protein   Rheumatoid Factor   Insomnia    Symptoms poorly controlled. Will increase Trazodone to 1058mdaily at bedtime. Follow up 2 weeks and prn.      Relevant Medications   traZODone (DESYREL) 100 MG tablet       Return in about 2 weeks (around 12/18/2015)  for Recheck.  JeRonette DeterMD Internal Medicine [place]

## 2015-12-04 NOTE — Assessment & Plan Note (Signed)
Chronic back pain. No improvement with PT. No improvement with Prednisone. Unable to tolerate gabapentin and lyrica. No improvement with Flexeril. Itching with Oxycodone. Will increase Cymbalta to 60mg  daily. Recheck in 2 weeks.

## 2015-12-04 NOTE — Assessment & Plan Note (Signed)
Chronic fatigue and pain. Previously diagnosed with auto-immune disorder, however could not tolerate Plaquenil. Will recheck CBC, CMP, TSH, B12, ANA, ESR, CRP. Follow up in 2 weeks.

## 2015-12-05 LAB — ANA: Anti Nuclear Antibody(ANA): POSITIVE — AB

## 2015-12-05 LAB — ANTI-NUCLEAR AB-TITER (ANA TITER): ANA Titer 1: 1:320 {titer} — ABNORMAL HIGH

## 2015-12-20 ENCOUNTER — Ambulatory Visit (INDEPENDENT_AMBULATORY_CARE_PROVIDER_SITE_OTHER): Payer: PPO | Admitting: Internal Medicine

## 2015-12-20 ENCOUNTER — Encounter: Payer: Self-pay | Admitting: Internal Medicine

## 2015-12-20 VITALS — BP 121/75 | HR 70 | Temp 96.5°F | Ht 64.0 in | Wt 187.1 lb

## 2015-12-20 DIAGNOSIS — R300 Dysuria: Secondary | ICD-10-CM | POA: Insufficient documentation

## 2015-12-20 DIAGNOSIS — M3500 Sicca syndrome, unspecified: Secondary | ICD-10-CM

## 2015-12-20 DIAGNOSIS — G894 Chronic pain syndrome: Secondary | ICD-10-CM

## 2015-12-20 DIAGNOSIS — E559 Vitamin D deficiency, unspecified: Secondary | ICD-10-CM | POA: Diagnosis not present

## 2015-12-20 LAB — POCT URINALYSIS DIPSTICK
Bilirubin, UA: NEGATIVE
Blood, UA: NEGATIVE
Glucose, UA: 500
Ketones, UA: NEGATIVE
Nitrite, UA: NEGATIVE
Protein, UA: NEGATIVE
Spec Grav, UA: <=1.005
Urobilinogen, UA: 0.2
pH, UA: 5.5

## 2015-12-20 MED ORDER — ERGOCALCIFEROL 1.25 MG (50000 UT) PO CAPS
50000.0000 [IU] | ORAL_CAPSULE | ORAL | Status: DC
Start: 2015-12-20 — End: 2016-09-03

## 2015-12-20 NOTE — Progress Notes (Signed)
Subjective:    Patient ID: Kristina Mccoy, female    DOB: 11/29/1955, 60 y.o.   MRN: [id number]  HPI  60YO female presents for follow up.  Last seen 3/6. Increased Cymbalta to help with pain. No improvement in pain with this. Continues to use Advil 600mg  tid and Tumeric. Some improvement with this.  Started Vit D 2000units daily.  Continues to feel extremely exhausted. Having bilateral ear pain, described as pressure and throbbing. Had chills last week but no fever. Had some burning with urination.  DM - Cut back on Lantus to 15units daily. Fasting BG near 140s.  Lab Results  Component Value Date   HGBA1C 8.8* 10/19/2015     Wt Readings from Last 3 Encounters:  12/20/15 187 lb 2 oz (84.879 kg)  12/04/15 185 lb (83.915 kg)  11/09/15 188 lb (85.276 kg)   BP Readings from Last 3 Encounters:  12/20/15 121/75  12/04/15 126/76  11/09/15 127/80    Past Medical History  Diagnosis Date  . Otitis media, chronic   . Diabetes mellitus   . Fibromyalgia   . Breast cancer ([place]) 5/14    left breast  . Lymphedema of arm     right  . Hyperlipidemia   . Heart murmur   . Diverticulosis   . Peptic ulcer   . Sjogren's disease ([place])   . Breast cancer ([place]) 02/2013    Dr Laurance Flatten ([facility])   Family History  Problem Relation Age of Onset  . Diabetes Mother   . Heart disease Mother   . Hyperlipidemia Mother   . Heart failure Mother   . Heart disease Brother   . Hyperlipidemia Brother   . Diabetes Other   . Colon polyps     Past Surgical History  Procedure Laterality Date  . Mastectomy Bilateral 03/23/13  . Breast surgery     Social History   Social History  . Marital Status: Single    Spouse Name: N/A  . Number of Children: N/A  . Years of Education: N/A   Occupational History  . disabled    Social History Main Topics  . Smoking status: Former Smoker -- 0.50 packs/day    Types: Cigarettes    Quit date: 02/28/2013  . Smokeless tobacco: Never Used  . Alcohol  Use: No  . Drug Use: No  . Sexual Activity: Not Asked   Other Topics Concern  . None   Social History Narrative    Review of Systems  Constitutional: Positive for fatigue. Negative for fever, chills, appetite change and unexpected weight change.  Eyes: Negative for visual disturbance.  Respiratory: Negative for cough and shortness of breath.   Cardiovascular: Negative for chest pain and leg swelling.  Gastrointestinal: Negative for nausea, vomiting, abdominal pain, diarrhea and constipation.  Musculoskeletal: Positive for myalgias and arthralgias.  Skin: Negative for color change and rash.  Hematological: Negative for adenopathy. Does not bruise/bleed easily.  Psychiatric/Behavioral: Positive for sleep disturbance and dysphoric mood. The patient is not nervous/anxious.        Objective:    BP 121/75 mmHg  Pulse 70  Temp(Src) 96.5 F (35.8 C) (Axillary)  Ht 5\' 4"  (1.626 m)  Wt 187 lb 2 oz (84.879 kg)  BMI 32.10 kg/m2  SpO2 99% Physical Exam  Constitutional: She is oriented to person, place, and time. She appears well-developed and well-nourished. No distress.  HENT:  Head: Normocephalic and atraumatic.  Right Ear: External ear normal.  Left Ear: External ear normal.  Nose:  Nose normal.  Mouth/Throat: Oropharynx is clear and moist. No oropharyngeal exudate.  Eyes: Conjunctivae are normal. Pupils are equal, round, and reactive to light. Right eye exhibits no discharge. Left eye exhibits no discharge. No scleral icterus.  Neck: Normal range of motion. Neck supple. No tracheal deviation present. No thyromegaly present.  Cardiovascular: Normal rate, regular rhythm, normal heart sounds and intact distal pulses.  Exam reveals no gallop and no friction rub.   No murmur heard. Pulmonary/Chest: Effort normal and breath sounds normal. No respiratory distress. She has no wheezes. She has no rales. She exhibits no tenderness.  Musculoskeletal: Normal range of motion. She exhibits no  edema or tenderness.  Lymphadenopathy:    She has no cervical adenopathy.  Neurological: She is alert and oriented to person, place, and time. No cranial nerve deficit. She exhibits normal muscle tone. Coordination normal.  Skin: Skin is warm and dry. No rash noted. She is not diaphoretic. No erythema. No pallor.  Psychiatric: Her speech is normal and behavior is normal. Judgment and thought content normal. Her mood appears anxious. Cognition and memory are normal. She exhibits a depressed mood.          Assessment & Plan:   Problem List Items Addressed This Visit      Unprioritized   Chronic pain disorder - Primary    Chronic pain secondary, at least in part, to autoimmune disorder. No improvement with increase in Cymbalta. Will continue Ibuprofen. Follow up after rheumatology evaluation.      Dysuria    Recent dysuria. UA today is normal except for few leukocytes. Will send urine for culture.      Relevant Orders   POCT Urinalysis Dipstick (Completed)   Sjogren's syndrome ([place])    History of Sjogren's Syndrome. Started on Plaquenil, however could not tolerate this medication. Will set up evaluation with Rheumatology in [place]. Question if any other intervention might help with chronic inflammation/joint pain. Trying to avoid steroids given DM.      Relevant Orders   Ambulatory referral to Rheumatology   Vitamin D deficiency    Will increase to Rx Vit D 50000IU weekly x 12 weeks, then plan for repeat level.      Relevant Medications   ergocalciferol (VITAMIN D2) 50000 units capsule       Return in about 4 weeks (around 01/17/2016) for Recheck.  Ronette Deter, MD Internal Medicine [facility]

## 2015-12-20 NOTE — Assessment & Plan Note (Signed)
Chronic pain secondary, at least in part, to autoimmune disorder. No improvement with increase in Cymbalta. Will continue Ibuprofen. Follow up after rheumatology evaluation.

## 2015-12-20 NOTE — Assessment & Plan Note (Signed)
Will increase to Rx Vit D 50000IU weekly x 12 weeks, then plan for repeat level.

## 2015-12-20 NOTE — Assessment & Plan Note (Signed)
Recent dysuria. UA today is normal except for few leukocytes. Will send urine for culture.

## 2015-12-20 NOTE — Assessment & Plan Note (Signed)
History of Sjogren's Syndrome. Started on Plaquenil, however could not tolerate this medication. Will set up evaluation with Rheumatology in [place]. Question if any other intervention might help with chronic inflammation/joint pain. Trying to avoid steroids given DM.

## 2015-12-20 NOTE — Progress Notes (Signed)
Pre visit review using our clinic review tool, if applicable. No additional management support is needed unless otherwise documented below in the visit note. 

## 2016-01-01 ENCOUNTER — Other Ambulatory Visit: Payer: Self-pay | Admitting: Internal Medicine

## 2016-01-05 ENCOUNTER — Encounter: Payer: Self-pay | Admitting: Internal Medicine

## 2016-01-08 ENCOUNTER — Telehealth: Payer: Self-pay | Admitting: Internal Medicine

## 2016-01-08 DIAGNOSIS — Z6831 Body mass index (BMI) 31.0-31.9, adult: Secondary | ICD-10-CM | POA: Diagnosis not present

## 2016-01-08 DIAGNOSIS — C50411 Malignant neoplasm of upper-outer quadrant of right female breast: Secondary | ICD-10-CM | POA: Diagnosis not present

## 2016-01-08 DIAGNOSIS — E559 Vitamin D deficiency, unspecified: Secondary | ICD-10-CM | POA: Diagnosis not present

## 2016-01-08 NOTE — Telephone Encounter (Signed)
Pt came in an dropped off a Disability form to be filled out.. placed in Dr. Derry Skill Box.. Please advise pt when ready.Marland Kitchen

## 2016-01-09 ENCOUNTER — Encounter: Payer: Self-pay | Admitting: *Deleted

## 2016-01-09 NOTE — Telephone Encounter (Signed)
Forms have been completed and ready for pick up

## 2016-01-19 DIAGNOSIS — Z7689 Persons encountering health services in other specified circumstances: Secondary | ICD-10-CM

## 2016-01-22 ENCOUNTER — Telehealth: Payer: Self-pay | Admitting: *Deleted

## 2016-01-22 ENCOUNTER — Encounter: Payer: Self-pay | Admitting: Internal Medicine

## 2016-01-22 ENCOUNTER — Ambulatory Visit (INDEPENDENT_AMBULATORY_CARE_PROVIDER_SITE_OTHER): Payer: PPO | Admitting: Internal Medicine

## 2016-01-22 VITALS — BP 128/72 | HR 109 | Ht 64.0 in | Wt 186.0 lb

## 2016-01-22 DIAGNOSIS — G4733 Obstructive sleep apnea (adult) (pediatric): Secondary | ICD-10-CM

## 2016-01-22 DIAGNOSIS — J069 Acute upper respiratory infection, unspecified: Secondary | ICD-10-CM

## 2016-01-22 DIAGNOSIS — R0600 Dyspnea, unspecified: Secondary | ICD-10-CM

## 2016-01-22 DIAGNOSIS — Z9989 Dependence on other enabling machines and devices: Principal | ICD-10-CM

## 2016-01-22 NOTE — Telephone Encounter (Signed)
No new sleep study. She can get a new CPAP, just need a copy of the previous sleep study. She can call her DME to inquire about the new CPAP machine process.   -VM

## 2016-01-22 NOTE — Patient Instructions (Signed)
Follow up with Dr. Stevenson Clinch in: 2 months - avoid tobacco exposure as much as possible - High resolution CT chest without contrast due to recurrent URI's and shortness of breath/cough - cont with diet and exercise as tolerated.  - cont with Zyrtec - albuterol inhaler - 2puff every 3-4 hours as needed for shortness of breath\wheezing\recurrent cough - Flonase (OTC) allergy relief - 1 spray in each nostril daily during allergy season.

## 2016-01-22 NOTE — Telephone Encounter (Signed)
Pt thought that CPAP machine was purchased with [facility] in [place].  Called [facility] and they have no record of her getting CPAP with them, only o2.  Pt stated that she had a sleep study with Dr. Laurelyn Sickle in 2008, had sleep study then was supplied the machine.   [facility] is insisting that pt received only o2 with them.  I saw an order in epic for CPAP with [facility] and wondering if that is where CPAP was purchased.  Sent staff message to Darlina Guys for her to check and get back with me on this matter. [name]

## 2016-01-22 NOTE — Telephone Encounter (Signed)
Kristina Mccoy has called pt to find out what DME company she uses so we can get her current CPAP. Waiting on pt to call back.

## 2016-01-22 NOTE — Progress Notes (Signed)
MRN# [id number] Kristina Mccoy 1956-08-24   CC:" Follow-up of my tests for shortness of breath" Chief Complaint  Patient presents with  . Follow-up    pt states breating has worsen. increased SOB, occ prod cough, wheezing, chest tightness w/deep breathing.rt upper back pain. wearing CPAP 5hr everynight. feels pressure is too strong after 4-5 hr. DME: [facility]      Brief History: Synopsis: 35 female presenting to [facility] January 2016 for evaluation of dyspnea on exertion.  Patient has a complex medical history of breast cancer, Sjorgen disease, fibromyalgia, obstructive sleep apnea, obesity, diabetes and hyperlipidemia. Patient states she was recently admitted to the hospital in December 2015 since then she has not been able to get back to baseline breathing, she has dyspnea at rest, which is slowly getting better. PFTs and mild restriction, RV 74, DLCO 61. Sleep study February 2016 AHI 14.3, AutoPap 5-15.   Events since last clinic visit: Patient presents today for a follow up of dyspnea.   She states since her last visit she was diagnosed with sinusitis, right sided ear infection, and bronchitis, she was given antibiotics and steroids, however she still admits to cough up productive green sputum at times, shortness of breath with mild DOE. States that she his wearing her CPAP up to 5 hours per night.  Also stated that she has some back spasms, had a recent MRI Spine that showed disc herniation.  She states that she still has nasal congestion, was using Nasonex but he recently causes a rash and she had to stop it. She follows with ENT, recent MRI of the brain showed she had fluid in her and her air and she is being treated for that. Over the past months she's had subjective fever and chills. ENT placed her on a steroid taper, and then primary care placed also on a steroid taper. Currently using albuterol 3-4 times per week. States that allergies are  currently making her symptoms worse. No fever or chills today, mainly nasal congestion and cough.   PMHX:   Past Medical History  Diagnosis Date  . Otitis media, chronic   . Diabetes mellitus   . Fibromyalgia   . Breast cancer ([place]) 5/14    left breast  . Lymphedema of arm     right  . Hyperlipidemia   . Heart murmur   . Diverticulosis   . Peptic ulcer   . Sjogren's disease ([place])   . Breast cancer ([place]) 02/2013    Dr Laurance Flatten ([facility])   Surgical Hx:  Past Surgical History  Procedure Laterality Date  . Mastectomy Bilateral 03/23/13  . Breast surgery     Family Hx:  Family History  Problem Relation Age of Onset  . Diabetes Mother   . Heart disease Mother   . Hyperlipidemia Mother   . Heart failure Mother   . Heart disease Brother   . Hyperlipidemia Brother   . Diabetes Other   . Colon polyps     Social Hx:   Social History  Substance Use Topics  . Smoking status: Former Smoker -- 0.50 packs/day    Types: Cigarettes    Quit date: 02/28/2013  . Smokeless tobacco: Never Used  . Alcohol Use: No   Medication:   Current Outpatient Rx  Name  Route  Sig  Dispense  Refill  . albuterol (PROVENTIL HFA;VENTOLIN HFA) 108 (90 BASE) MCG/ACT inhaler   Inhalation   Inhale 2 puffs into the lungs every 6 (six) hours  as needed for wheezing or shortness of breath.   1 Inhaler   2   . ALPRAZolam (XANAX) 0.5 MG tablet      TAKE ONE TABLET BY MOUTH FOUR TIMES A DAY AS NEEDED FOR ANXIETY   120 tablet   3   . cetirizine (ZYRTEC) 10 MG tablet   Oral   Take 10 mg by mouth 2 (two) times daily.         . clobetasol ointment (TEMOVATE) 0.05 %   Topical   Apply 1 application topically 2 (two) times daily.         . cyclobenzaprine (FLEXERIL) 5 MG tablet      TAKE ONE TABLET BY MOUTH THREE TIMES DAILY AS NEEDED FOR MUSCLE SPASM   30 tablet   3   . DULoxetine (CYMBALTA) 60 MG capsule   Oral   Take 1 capsule (60 mg total) by mouth daily.   30 capsule   3   .  Emollient (CERAVE) CREA   Apply externally   Apply topically.         . ergocalciferol (VITAMIN D2) 50000 units capsule   Oral   Take 1 capsule (50,000 Units total) by mouth once a week.   12 capsule   0   . glipiZIDE (GLUCOTROL) 5 MG tablet      TAKE ONE (1) TABLET BY MOUTH IN THE MORNING AND TWO (2) IN THE EVENING   270 tablet   1   . hyoscyamine (LEVSIN, ANASPAZ) 0.125 MG tablet      TAKE ONE TABLET BY MOUTH EVERY 4 HOURS AS NEEDED   30 tablet   3   . Insulin Glargine (LANTUS SOLOSTAR) 100 UNIT/ML Solostar Pen   Subcutaneous   Inject 12 Units into the skin daily at 10 pm.   15 pen   3   . mometasone (NASONEX) 50 MCG/ACT nasal spray   Nasal   Place 2 sprays into the nose daily.   17 g   12   . ONE TOUCH ULTRA TEST test strip      CHECK SIX TIMES DAILY   100 each   3   . ONETOUCH DELICA LANCETS 99991111 MISC      TWICE DAILY   100 each   3   . pantoprazole (PROTONIX) 40 MG tablet   Oral   Take 1 tablet (40 mg total) by mouth 2 (two) times daily.   60 tablet   3   . prochlorperazine (COMPAZINE) 10 MG tablet   Oral   Take 10 mg by mouth every 6 (six) hours as needed.         . ranitidine (ZANTAC) 300 MG tablet   Oral   Take 1 tablet (300 mg total) by mouth daily.   90 tablet   3   . tamoxifen (NOLVADEX) 20 MG tablet   Oral   Take 20 mg by mouth daily.         . traZODone (DESYREL) 100 MG tablet   Oral   Take 1 tablet (100 mg total) by mouth at bedtime as needed for sleep.   30 tablet   3      Review of Systems: Gen:  General fatigue HEENT: Denies blurred vision, double vision, ear pain, eye pain, hearing loss, nose bleeds, sore throat.  Positive sinus pressure and nasal drainage.  Cvc:  No dizziness, chest pain or heaviness Resp:   Shortness of breath with exertion, Cough with productive sputum, nasal congestion  Gi: Denies swallowing difficulty, stomach pain, nausea or vomiting, diarrhea, constipation, bowel incontinence Gu:  Denies  bladder incontinence, burning urine Ext:   No Joint pain, stiffness or swelling Skin: No skin rash, easy bruising or bleeding or hives Endoc:  No polyuria, polydipsia , polyphagia or weight change Psych: No depression, insomnia or hallucinations  Other:  All other systems negative  Allergies:  Gabapentin; Buprenorphine hcl; Carbamazepine; Methylprednisolone; Morphine and related; and Pregabalin  Physical Examination:  VS: BP 128/72 mmHg  Pulse 109  Ht 5\' 4"  (1.626 m)  Wt 186 lb (84.369 kg)  BMI 31.91 kg/m2  SpO2 98%  General Appearance: No distress  HEENT: PERRLA, EOM intact, no ptosis, no other lesions noticed, mild frontal/maxillary pressure  Pulmonary:Exam: normal breath sounds., diaphragmatic excursion normal.No wheezing, No rales . Mild coarse upper airway sounds Cardiovascular:  Normal S1,S2.  No m/r/g.     Abdomen:Exam: Benign, Soft, non-tender, No masses  Skin:   warm, no rashes, no ecchymosis  Extremities: normal, no cyanosis, clubbing, no edema, warm with normal capillary refill.   Labs results:  BMP Lab Results  Component Value Date   NA 134* 10/19/2015   K 4.1 10/19/2015   CL 99 10/19/2015   CO2 29 10/19/2015   GLUCOSE 225* 10/19/2015   BUN 14 10/19/2015   CREATININE 0.88 10/19/2015     CBC CBC Latest Ref Rng 12/04/2015 10/04/2014 09/08/2014  WBC 4.0 - 10.5 K/uL 5.7 8.0 11.6(H)  Hemoglobin 12.0 - 15.0 g/dL 13.8 14.5 13.8  Hematocrit 36.0 - 46.0 % 40.2 44.2 41.6  Platelets 150.0 - 400.0 K/uL 204.0 229.0 319.0     Rad results: (The following images and results were reviewed by Dr. Stevenson Clinch on 01/22/2016). CXR 11/2015 FINDINGS: The lungs are reasonably well inflated and clear. The heart and pulmonary vascularity are normal. The mediastinum is normal in width. There is no pleural effusion. The trachea is midline. There is mild multilevel degenerative disc disease of the thoracic spine.  IMPRESSION: There is no active cardiopulmonary disease. If the  patient's symptoms persist and remain unexplained, further evaluation with chest CT scanning may be the most useful next imaging step.     Other: 11/30/2014 6 minute walk test-312 m/1023ft, lowest sat 99%, highest heart rate 74 Pulmonary function test: FEV1 72%, FEV1/FVC 84, RV 74, TLC 76, RV/TLC 97% ERV 24% Sleep study 11/18/2014: AHI 14.3, supine AHI 55.1  Assessment and Plan: 60 year old female with dyspnea on exertion. OSA on CPAP Patient with known history of sleep apnea, no followup in the past 7 years. Current states that he would AHI 14.3, supine AHI 55.1, The results were discussed with the patient, untreated obstructive sleep apnea could be adding to her generalized fatigue and overall dyspnea on exertion. Patient is adamant about not having a split-night study, we have agreed to give a AutoPap trial 5-15.  However, at this time she is only able to tolerate about 5 hours per night    OSA  Encouraged proper weight management.  Excessive weight may contribute to snoring.  Monitor sedative use.  Discussed driving precautions and its relationship with hypersomnolence.  Discussed operating dangerous equipment and its relationship with hypersomnolence.  Discussed sleep hygiene, and benefits of a fixed sleep waked time.  The importance of getting eight or more hours of sleep discussed with patient.  Discussed limiting the use of the computer and television before bedtime.  Decrease naps during the day, so night time sleep will become enhanced.  Limit caffeine, and sleep deprivation.  HTN, stroke, and heart failure are potential risk factors.     Plan: AHI 14.3, supine AHI 55, AutoPap-5-15 cm of water, goal cpap use is 6-8 hrs per night.  Currently only tolerating about 5 hours.           Dyspnea Differential diagnosis includes: Sequela of pneumonia, relation to autoimmune disease, radiation sequela, chemotherapy sequela, cardiac, obstructive lung disease, restrictive  lung disease, deconditioning  Patient with complex medical history making a single etiology for her dyspnea very difficult. However, dyspnea seems to be worse since her recent multilobar pneumonia in December of 2015. Dyspnea is most likely multifactorial in nature as sequelae of pneumonia, deconditioning, cardiac stress given a BNP of 3300 and elevated troponins, possible sequelae of radiation therapy, and lastly in effective treatment for sleep apnea.  Pulmonary function testing with no significant obstruction, she does have mild restriction with decreased ERV and RV.  Since her last visit she is required 2 more doses of steroid taper, for suspected sinusitis, acute bronchitis, upper respiratory tract infection. At today's visit visit we discussed given her history of fibromyalgia, Sjogren's disease (autoimmune), we will perform a high-resolution CAT scan to determine if she has pulmonary dysfunction due to autoimmune disease causing interstitial lung disease.   Plan - Patient consulledon diet and exercise as tolerated. -Sleep study with mild obstructive sleep apnea, cont on CPAP (AutoPap 5-15) -High-resolution CAT scan for recurrent upper respiratory tract infection/history of autoimmune disease, evaluation for interstitial lung disease      Recurrent URI (upper respiratory infection) Patient with recurrent upper respiratory tract infection, requiring multiple doses of steroid taper his antibiotics over the past year. At this time given her history of fibromyalgia, autoimmune disease (Sjogren's), and mild restriction on PFTs, we have decided to perform a high-resolution CAT scan to evaluate for any underlying interstitial lung disease. Patient just completed another steroid taper, with improving symptoms today. No further steroid at this time or antibiotics needed at today's visit.  Plan: -High-resolution CAT scan without contrast prior to follow-up visit line-continue with allergy  control -As needed rescue inhaler -Flonase over-the-counter for allergy relief    Updated Medication List Outpatient Encounter Prescriptions as of 01/22/2016  Medication Sig  . albuterol (PROVENTIL HFA;VENTOLIN HFA) 108 (90 BASE) MCG/ACT inhaler Inhale 2 puffs into the lungs every 6 (six) hours as needed for wheezing or shortness of breath.  . ALPRAZolam (XANAX) 0.5 MG tablet TAKE ONE TABLET BY MOUTH FOUR TIMES A DAY AS NEEDED FOR ANXIETY  . cetirizine (ZYRTEC) 10 MG tablet Take 10 mg by mouth 2 (two) times daily.  . clobetasol ointment (TEMOVATE) [id number] % Apply 1 application topically 2 (two) times daily.  . cyclobenzaprine (FLEXERIL) 5 MG tablet TAKE ONE TABLET BY MOUTH THREE TIMES DAILY AS NEEDED FOR MUSCLE SPASM  . DULoxetine (CYMBALTA) 60 MG capsule Take 1 capsule (60 mg total) by mouth daily.  . Emollient (CERAVE) CREA Apply topically.  . ergocalciferol (VITAMIN D2) 50000 units capsule Take 1 capsule (50,000 Units total) by mouth once a week.  Marland Kitchen glipiZIDE (GLUCOTROL) 5 MG tablet TAKE ONE (1) TABLET BY MOUTH IN THE MORNING AND TWO (2) IN THE EVENING  . hyoscyamine (LEVSIN, ANASPAZ) 0.125 MG tablet TAKE ONE TABLET BY MOUTH EVERY 4 HOURS AS NEEDED  . Insulin Glargine (LANTUS SOLOSTAR) 100 UNIT/ML Solostar Pen Inject 12 Units into the skin daily at 10 pm.  . mometasone (NASONEX) 50 MCG/ACT nasal spray Place 2 sprays into the nose daily.  . ONE TOUCH ULTRA  TEST test strip CHECK SIX TIMES DAILY  . ONETOUCH DELICA LANCETS 99991111 MISC TWICE DAILY  . pantoprazole (PROTONIX) 40 MG tablet Take 1 tablet (40 mg total) by mouth 2 (two) times daily.  . prochlorperazine (COMPAZINE) 10 MG tablet Take 10 mg by mouth every 6 (six) hours as needed.  . ranitidine (ZANTAC) 300 MG tablet Take 1 tablet (300 mg total) by mouth daily.  . tamoxifen (NOLVADEX) 20 MG tablet Take 20 mg by mouth daily.  . traZODone (DESYREL) 100 MG tablet Take 1 tablet (100 mg total) by mouth at bedtime as needed for sleep.   No  facility-administered encounter medications on file as of 01/22/2016.    Orders for this visit: Orders Placed This Encounter  Procedures  . CT Chest Wo Contrast    Standing Status: Future     Number of Occurrences:      Standing Expiration Date: 03/23/2017    Order Specific Question:  Reason for Exam (SYMPTOM  OR DIAGNOSIS REQUIRED)    Answer:  recurrent URIs and dyspnea    Order Specific Question:  Is the patient pregnant?    Answer:  No    Order Specific Question:  Preferred imaging location?    Answer:  [facility]    Thank  you for the visitation and for allowing  [facility] to assist in the care of your patient. Our recommendations are noted above.  Please contact us if we can be of further service.  Vilinda Boehringer, MD [facility] Office Number: [phone]

## 2016-01-22 NOTE — Telephone Encounter (Signed)
Pt is asking if we can get her a new CPAP since hers is about 58 yrs old. Her last sleep study was 10/2014. Please advise. Will she need a titration also?

## 2016-01-23 ENCOUNTER — Other Ambulatory Visit: Payer: Self-pay | Admitting: Internal Medicine

## 2016-01-23 NOTE — Telephone Encounter (Signed)
ATC patient today and unable to leave message due to voice mail being full. Per Darlina Guys with [facility]. Pt came to [facility] in March 2016 as new PAP Supply patient. [facility] had orders to arrange Auto titration - pt declined the auto.  [facility] have made attempts to contact her to obtain download but have been unable to reach her. Pt has never received any supplies or anything from [facility]. Waiting on patient to return call. [name]

## 2016-01-23 NOTE — Telephone Encounter (Signed)
Pt has her CPAP with [facility]. Kristina Mccoy is to let us know what her current CPAP pressure is.  Waiting on [facility] to advise what pressure she is set on. [name]

## 2016-01-24 DIAGNOSIS — J069 Acute upper respiratory infection, unspecified: Secondary | ICD-10-CM | POA: Insufficient documentation

## 2016-01-24 NOTE — Assessment & Plan Note (Signed)
Patient with known history of sleep apnea, no followup in the past 7 years. Current states that he would AHI 14.3, supine AHI 55.1, The results were discussed with the patient, untreated obstructive sleep apnea could be adding to her generalized fatigue and overall dyspnea on exertion. Patient is adamant about not having a split-night study, we have agreed to give a AutoPap trial 5-15.  However, at this time she is only able to tolerate about 5 hours per night    OSA  Encouraged proper weight management.  Excessive weight may contribute to snoring.  Monitor sedative use.  Discussed driving precautions and its relationship with hypersomnolence.  Discussed operating dangerous equipment and its relationship with hypersomnolence.  Discussed sleep hygiene, and benefits of a fixed sleep waked time.  The importance of getting eight or more hours of sleep discussed with patient.  Discussed limiting the use of the computer and television before bedtime.  Decrease naps during the day, so night time sleep will become enhanced.  Limit caffeine, and sleep deprivation.  HTN, stroke, and heart failure are potential risk factors.     Plan: AHI 14.3, supine AHI 55, AutoPap-5-15 cm of water, goal cpap use is 6-8 hrs per night.  Currently only tolerating about 5 hours.

## 2016-01-24 NOTE — Telephone Encounter (Signed)
Called and spoke with patient this afternoon and advised that the DME company that we arranged for her was [facility].  [facility] only provided her with o2. Patient stated that if Dr Stevenson Clinch would look at her Sleep Study and provide DME referral for new CPAP with pressure related to recent study to send to [facility] in [place].  Advised patient that it would be tomorrow before order would be placed. Pt voiced understanding and nothing else needed at this point.  Phone message sent to [facility] to review with Dr. Stevenson Clinch and referral placed to [facility]. [name]

## 2016-01-24 NOTE — Assessment & Plan Note (Signed)
Differential diagnosis includes: Sequela of pneumonia, relation to autoimmune disease, radiation sequela, chemotherapy sequela, cardiac, obstructive lung disease, restrictive lung disease, deconditioning  Patient with complex medical history making a single etiology for her dyspnea very difficult. However, dyspnea seems to be worse since her recent multilobar pneumonia in December of 2015. Dyspnea is most likely multifactorial in nature as sequelae of pneumonia, deconditioning, cardiac stress given a BNP of 3300 and elevated troponins, possible sequelae of radiation therapy, and lastly in effective treatment for sleep apnea.  Pulmonary function testing with no significant obstruction, she does have mild restriction with decreased ERV and RV.  Since her last visit she is required 2 more doses of steroid taper, for suspected sinusitis, acute bronchitis, upper respiratory tract infection. At today's visit visit we discussed given her history of fibromyalgia, Sjogren's disease (autoimmune), we will perform a high-resolution CAT scan to determine if she has pulmonary dysfunction due to autoimmune disease causing interstitial lung disease.   Plan - Patient consulledon diet and exercise as tolerated. -Sleep study with mild obstructive sleep apnea, cont on CPAP (AutoPap 5-15) -High-resolution CAT scan for recurrent upper respiratory tract infection/history of autoimmune disease, evaluation for interstitial lung disease

## 2016-01-24 NOTE — Assessment & Plan Note (Signed)
Patient with recurrent upper respiratory tract infection, requiring multiple doses of steroid taper his antibiotics over the past year. At this time given her history of fibromyalgia, autoimmune disease (Sjogren's), and mild restriction on PFTs, we have decided to perform a high-resolution CAT scan to evaluate for any underlying interstitial lung disease. Patient just completed another steroid taper, with improving symptoms today. No further steroid at this time or antibiotics needed at today's visit.  Plan: -High-resolution CAT scan without contrast prior to follow-up visit line-continue with allergy control -As needed rescue inhaler -Flonase over-the-counter for allergy relief

## 2016-01-25 NOTE — Telephone Encounter (Signed)
If we cannot get her last sleep study results, then we can do an autotitration (5-15).  Thanks VM

## 2016-01-25 NOTE — Telephone Encounter (Signed)
We are unable to get pt's current CPAP setting. Pt is asking if you can go by her sleep study and give her new settings so that we can get her a new CPAP. Do you want to do an auto titration or send her in for an in lab titration?

## 2016-01-25 NOTE — Telephone Encounter (Signed)
Her last sleep study is under media tab from 10/2014.

## 2016-01-25 NOTE — Telephone Encounter (Signed)
Based on 2016 sleep study, she has very severe sleep apnea (AHI =55).  She will require an in lab sleep titration study.  Thanks VM

## 2016-01-25 NOTE — Telephone Encounter (Signed)
Spoke with pt and informed her we need the CPAP titration. Pt agreed. Titration ordered. Nothing further needed at this time.

## 2016-01-26 ENCOUNTER — Ambulatory Visit (INDEPENDENT_AMBULATORY_CARE_PROVIDER_SITE_OTHER): Payer: PPO | Admitting: Internal Medicine

## 2016-01-26 ENCOUNTER — Encounter: Payer: Self-pay | Admitting: Internal Medicine

## 2016-01-26 VITALS — BP 128/82 | HR 75 | Temp 98.2°F | Ht 64.0 in | Wt 185.8 lb

## 2016-01-26 DIAGNOSIS — G4733 Obstructive sleep apnea (adult) (pediatric): Secondary | ICD-10-CM

## 2016-01-26 DIAGNOSIS — Z79899 Other long term (current) drug therapy: Secondary | ICD-10-CM | POA: Diagnosis not present

## 2016-01-26 DIAGNOSIS — R519 Headache, unspecified: Secondary | ICD-10-CM | POA: Insufficient documentation

## 2016-01-26 DIAGNOSIS — E1165 Type 2 diabetes mellitus with hyperglycemia: Secondary | ICD-10-CM

## 2016-01-26 DIAGNOSIS — E1140 Type 2 diabetes mellitus with diabetic neuropathy, unspecified: Secondary | ICD-10-CM

## 2016-01-26 DIAGNOSIS — G894 Chronic pain syndrome: Secondary | ICD-10-CM | POA: Diagnosis not present

## 2016-01-26 DIAGNOSIS — R51 Headache: Secondary | ICD-10-CM | POA: Diagnosis not present

## 2016-01-26 DIAGNOSIS — G8929 Other chronic pain: Secondary | ICD-10-CM

## 2016-01-26 DIAGNOSIS — Z9989 Dependence on other enabling machines and devices: Secondary | ICD-10-CM

## 2016-01-26 DIAGNOSIS — Z794 Long term (current) use of insulin: Secondary | ICD-10-CM | POA: Diagnosis not present

## 2016-01-26 DIAGNOSIS — IMO0002 Reserved for concepts with insufficient information to code with codable children: Secondary | ICD-10-CM

## 2016-01-26 LAB — COMPREHENSIVE METABOLIC PANEL
ALT: 32 U/L (ref 0–35)
AST: 24 U/L (ref 0–37)
Albumin: 4.4 g/dL (ref 3.5–5.2)
Alkaline Phosphatase: 89 U/L (ref 39–117)
BUN: 16 mg/dL (ref 6–23)
CO2: 29 mEq/L (ref 19–32)
Calcium: 9.8 mg/dL (ref 8.4–10.5)
Chloride: 96 mEq/L (ref 96–112)
Creatinine, Ser: 0.83 mg/dL (ref 0.40–1.20)
GFR: 75.04 mL/min (ref >60.00)
Glucose, Bld: 182 mg/dL — ABNORMAL HIGH (ref 70–99)
Potassium: 4.5 mEq/L (ref 3.5–5.1)
Sodium: 132 mEq/L — ABNORMAL LOW (ref 135–145)
Total Bilirubin: 0.4 mg/dL (ref 0.2–1.2)
Total Protein: 7.8 g/dL (ref 6.0–8.3)

## 2016-01-26 LAB — HEMOGLOBIN A1C: Hgb A1c MFr Bld: 8.5 % — ABNORMAL HIGH (ref 4.6–6.5)

## 2016-01-26 LAB — VITAMIN D 25 HYDROXY (VIT D DEFICIENCY, FRACTURES): VITD: 44.61 ng/mL (ref 30.00–100.00)

## 2016-01-26 MED ORDER — FLUCONAZOLE 150 MG PO TABS: 150.0000 mg | ORAL_TABLET | Freq: Once | ORAL | Status: DC

## 2016-01-26 MED ORDER — CANAGLIFLOZIN 100 MG PO TABS: 100.0000 mg | ORAL_TABLET | Freq: Every day | ORAL | Status: DC

## 2016-01-26 NOTE — Assessment & Plan Note (Signed)
Headaches, mostly described as aching pain behind ears but some frontal and maxillary pressure as well. Reviewed MRI brain which showed non-specific changes. Will set up neurology evaluation

## 2016-01-26 NOTE — Progress Notes (Signed)
Subjective:    Patient ID: Kristina Mccoy, female    DOB: 02-16-1956, 60 y.o.   MRN: [id number]  HPI  60YO female presents for follow up.  Seen by Dr. Stevenson Clinch in Pulmonary. Scheduled for CPAP titration and high res chest CT. She continues to feel short of breath almost constantly  Chronic pain - having intense, throbbing and pulsing pain in abdomen, under arms, and over chest. Sometimes feels as if her neck is being crushed. Feels sick all the time. Has chronic aching headache pain including pain in her cheeks and forehead and behind both ears.  DM - Blood sugars in 200-300 range. Limiting sugar intake. Increasing intake of protein. Eating fruits and vegetables. One low sugar of 68.  Lab Results  Component Value Date   HGBA1C 8.8* 10/19/2015     Wt Readings from Last 3 Encounters:  01/26/16 185 lb 12.8 oz (84.278 kg)  01/22/16 186 lb (84.369 kg)  12/20/15 187 lb 2 oz (84.879 kg)   BP Readings from Last 3 Encounters:  01/26/16 128/82  01/22/16 128/72  12/20/15 121/75    Past Medical History  Diagnosis Date  . Otitis media, chronic   . Diabetes mellitus   . Fibromyalgia   . Breast cancer ([place]) 5/14    left breast  . Lymphedema of arm     right  . Hyperlipidemia   . Heart murmur   . Diverticulosis   . Peptic ulcer   . Sjogren's disease ([place])   . Breast cancer ([place]) 02/2013    Dr Laurance Flatten ([facility])   Family History  Problem Relation Age of Onset  . Diabetes Mother   . Heart disease Mother   . Hyperlipidemia Mother   . Heart failure Mother   . Heart disease Brother   . Hyperlipidemia Brother   . Diabetes Other   . Colon polyps     Past Surgical History  Procedure Laterality Date  . Mastectomy Bilateral 03/23/13  . Breast surgery     Social History   Social History  . Marital Status: Single    Spouse Name: N/A  . Number of Children: N/A  . Years of Education: N/A   Occupational History  . disabled    Social History Main Topics  . Smoking  status: Former Smoker -- 0.50 packs/day    Types: Cigarettes    Quit date: 02/28/2013  . Smokeless tobacco: Never Used  . Alcohol Use: No  . Drug Use: No  . Sexual Activity: Not Asked   Other Topics Concern  . None   Social History Narrative    Review of Systems  Constitutional: Positive for fatigue. Negative for fever, chills, appetite change and unexpected weight change.  Eyes: Negative for visual disturbance.  Respiratory: Positive for shortness of breath. Negative for cough, chest tightness and wheezing.   Cardiovascular: Negative for chest pain and leg swelling.  Gastrointestinal: Negative for nausea, vomiting, abdominal pain, diarrhea and constipation.  Musculoskeletal: Positive for myalgias, back pain and arthralgias.  Skin: Negative for color change and rash.  Neurological: Positive for headaches. Negative for dizziness, speech difficulty, weakness and light-headedness.  Hematological: Negative for adenopathy. Does not bruise/bleed easily.  Psychiatric/Behavioral: Positive for sleep disturbance and dysphoric mood. Negative for suicidal ideas. The patient is not nervous/anxious.        Objective:    BP 128/82 mmHg  Pulse 75  Temp(Src) 98.2 F (36.8 C) (Oral)  Ht 5\' 4"  (1.626 m)  Wt 185 lb 12.8 oz (84.278 kg)  BMI 31.88 kg/m2  SpO2 97% Physical Exam  Constitutional: She is oriented to person, place, and time. She appears well-developed and well-nourished. No distress.  HENT:  Head: Normocephalic and atraumatic.  Right Ear: External ear normal.  Left Ear: External ear normal.  Nose: Nose normal.  Mouth/Throat: Oropharynx is clear and moist. No oropharyngeal exudate.  Eyes: Conjunctivae are normal. Pupils are equal, round, and reactive to light. Right eye exhibits no discharge. Left eye exhibits no discharge. No scleral icterus.  Neck: Normal range of motion. Neck supple. No tracheal deviation present. No thyromegaly present.  Cardiovascular: Normal rate, regular  rhythm, normal heart sounds and intact distal pulses.  Exam reveals no gallop and no friction rub.   No murmur heard. Pulmonary/Chest: Effort normal and breath sounds normal. No respiratory distress. She has no wheezes. She has no rales. She exhibits no tenderness.  Musculoskeletal: Normal range of motion. She exhibits no edema or tenderness.  Lymphadenopathy:    She has no cervical adenopathy.  Neurological: She is alert and oriented to person, place, and time. No cranial nerve deficit. She exhibits normal muscle tone. Coordination normal.  Skin: Skin is warm and dry. No rash noted. She is not diaphoretic. No erythema. No pallor.  Psychiatric: She has a normal mood and affect. Her behavior is normal. Judgment and thought content normal.          Assessment & Plan:   Problem List Items Addressed This Visit      Unprioritized   Cephalalgia    Headaches, mostly described as aching pain behind ears but some frontal and maxillary pressure as well. Reviewed MRI brain which showed non-specific changes. Will set up neurology evaluation      Relevant Orders   Ambulatory referral to Neurology   VITAMIN D 25 Hydroxy (Vit-D Deficiency, Fractures)   Chronic pain disorder - Primary    Unclear etiology of chronic pain. Suspect autoimmune disease playing a role. New evaluation with rheumatology pending. Will follow. Continue Cymbalta.      Diabetes type 2, uncontrolled ([place])    Will check A1c with labs. BG elevated. Stop lantus and glipizide. Start Invokana and plan to follow up next week and potentially increase to 300mg  dosing.      Relevant Medications   canagliflozin (INVOKANA) 100 MG TABS tablet   Other Relevant Orders   Comprehensive metabolic panel   Hemoglobin A1c   OSA on CPAP    Reviewed notes from Dr. Stevenson Clinch and sleep study. CPAP titration pending. Will follow.          Return in about 1 week (around 02/02/2016) for Recheck.  Ronette Deter, MD Internal Medicine [facility]

## 2016-01-26 NOTE — Patient Instructions (Addendum)
Stop Lantus and Glipizide.  Start Invokana 100mg  daily in the morning.  We will set up evaluation with Dr. Tomi Likens.

## 2016-01-26 NOTE — Assessment & Plan Note (Signed)
Reviewed notes from Dr. Stevenson Clinch and sleep study. CPAP titration pending. Will follow.

## 2016-01-26 NOTE — Assessment & Plan Note (Signed)
Unclear etiology of chronic pain. Suspect autoimmune disease playing a role. New evaluation with rheumatology pending. Will follow. Continue Cymbalta.

## 2016-01-26 NOTE — Assessment & Plan Note (Signed)
Will check A1c with labs. BG elevated. Stop lantus and glipizide. Start Invokana and plan to follow up next week and potentially increase to 300mg  dosing.

## 2016-01-26 NOTE — Progress Notes (Signed)
Pre visit review using our clinic review tool, if applicable. No additional management support is needed unless otherwise documented below in the visit note. 

## 2016-01-29 ENCOUNTER — Other Ambulatory Visit: Payer: Self-pay | Admitting: Internal Medicine

## 2016-01-29 NOTE — Telephone Encounter (Signed)
Xanax refill request.  Last filled 09/28/15.  Last seen 01/26/16.  Please advise.

## 2016-02-15 ENCOUNTER — Encounter: Payer: Self-pay | Admitting: Internal Medicine

## 2016-02-16 ENCOUNTER — Ambulatory Visit (INDEPENDENT_AMBULATORY_CARE_PROVIDER_SITE_OTHER): Payer: PPO | Admitting: Internal Medicine

## 2016-02-16 ENCOUNTER — Encounter: Payer: Self-pay | Admitting: Internal Medicine

## 2016-02-16 VITALS — BP 122/76 | HR 86 | Ht 64.0 in | Wt 178.1 lb

## 2016-02-16 DIAGNOSIS — E1140 Type 2 diabetes mellitus with diabetic neuropathy, unspecified: Secondary | ICD-10-CM

## 2016-02-16 DIAGNOSIS — M546 Pain in thoracic spine: Secondary | ICD-10-CM

## 2016-02-16 DIAGNOSIS — R5383 Other fatigue: Secondary | ICD-10-CM

## 2016-02-16 DIAGNOSIS — J329 Chronic sinusitis, unspecified: Secondary | ICD-10-CM | POA: Insufficient documentation

## 2016-02-16 DIAGNOSIS — E1165 Type 2 diabetes mellitus with hyperglycemia: Secondary | ICD-10-CM

## 2016-02-16 DIAGNOSIS — G4700 Insomnia, unspecified: Secondary | ICD-10-CM | POA: Diagnosis not present

## 2016-02-16 DIAGNOSIS — E1142 Type 2 diabetes mellitus with diabetic polyneuropathy: Secondary | ICD-10-CM

## 2016-02-16 DIAGNOSIS — IMO0002 Reserved for concepts with insufficient information to code with codable children: Secondary | ICD-10-CM

## 2016-02-16 DIAGNOSIS — Z794 Long term (current) use of insulin: Secondary | ICD-10-CM | POA: Diagnosis not present

## 2016-02-16 LAB — BASIC METABOLIC PANEL
BUN: 21 mg/dL (ref 6–23)
CO2: 23 mEq/L (ref 19–32)
Calcium: 9.6 mg/dL (ref 8.4–10.5)
Chloride: 99 mEq/L (ref 96–112)
Creatinine, Ser: 0.87 mg/dL (ref 0.40–1.20)
GFR: 71.06 mL/min (ref >60.00)
Glucose, Bld: 196 mg/dL — ABNORMAL HIGH (ref 70–99)
Potassium: 4.3 mEq/L (ref 3.5–5.1)
Sodium: 134 mEq/L — ABNORMAL LOW (ref 135–145)

## 2016-02-16 MED ORDER — TRAZODONE HCL 100 MG PO TABS: 100.0000 mg | ORAL_TABLET | Freq: Every evening | ORAL | Status: DC | PRN

## 2016-02-16 MED ORDER — ALPRAZOLAM 0.5 MG PO TABS: ORAL_TABLET | ORAL | Status: DC

## 2016-02-16 MED ORDER — CANAGLIFLOZIN 300 MG PO TABS: 300.0000 mg | ORAL_TABLET | Freq: Every day | ORAL | Status: DC

## 2016-02-16 MED ORDER — DULOXETINE HCL 60 MG PO CPEP: 60.0000 mg | ORAL_CAPSULE | Freq: Every day | ORAL | Status: DC

## 2016-02-16 MED ORDER — FLUCONAZOLE 150 MG PO TABS: 150.0000 mg | ORAL_TABLET | ORAL | Status: DC

## 2016-02-16 NOTE — Assessment & Plan Note (Signed)
No improvement in BG with 100mg  dose of Invokana. Will increase to 300mg  daily. Electrolytes today. Follow up in 03/2016.

## 2016-02-16 NOTE — Progress Notes (Signed)
Subjective:    Patient ID: Kristina Mccoy, female    DOB: 08-Apr-1956, 60 y.o.   MRN: [id number]  HPI  60YO female presents for follow up.  Recently seen for DM. Started on Invokana, as BG elevated and A1c 8.5%. Blood sugars continue to be elevated, near 220. Tolerating Invokana well except for one vaginal yeast infection. Treated with Diflucan with improvement.  Lab Results  Component Value Date   HGBA1C 8.5* 01/26/2016   Continues to have extreme fatigue, diffuse pain. Chronic nasal congestion and ear pain. Has seen ENT in past. Exam was reportedly normal. Plans to follow up with them.   Wt Readings from Last 3 Encounters:  02/16/16 178 lb 1.9 oz (80.795 kg)  01/26/16 185 lb 12.8 oz (84.278 kg)  01/22/16 186 lb (84.369 kg)   BP Readings from Last 3 Encounters:  02/16/16 122/76  01/26/16 128/82  01/22/16 128/72    Past Medical History  Diagnosis Date  . Otitis media, chronic   . Diabetes mellitus   . Fibromyalgia   . Breast cancer ([place]) 5/14    left breast  . Lymphedema of arm     right  . Hyperlipidemia   . Heart murmur   . Diverticulosis   . Peptic ulcer   . Sjogren's disease ([place])   . Breast cancer ([place]) 02/2013    Dr Laurance Flatten ([facility])   Family History  Problem Relation Age of Onset  . Diabetes Mother   . Heart disease Mother   . Hyperlipidemia Mother   . Heart failure Mother   . Heart disease Brother   . Hyperlipidemia Brother   . Diabetes Other   . Colon polyps     Past Surgical History  Procedure Laterality Date  . Mastectomy Bilateral 03/23/13  . Breast surgery     Social History   Social History  . Marital Status: Single    Spouse Name: N/A  . Number of Children: N/A  . Years of Education: N/A   Occupational History  . disabled    Social History Main Topics  . Smoking status: Former Smoker -- 0.50 packs/day    Types: Cigarettes    Quit date: 02/28/2013  . Smokeless tobacco: Never Used  . Alcohol Use: No  . Drug Use: No  .  Sexual Activity: Not Asked   Other Topics Concern  . None   Social History Narrative    Review of Systems  Constitutional: Negative for fever, chills, appetite change, fatigue and unexpected weight change.  HENT: Positive for congestion, ear pain, hearing loss and sinus pressure. Negative for postnasal drip, rhinorrhea, sneezing, sore throat, tinnitus, trouble swallowing and voice change.   Eyes: Negative for visual disturbance.  Respiratory: Negative for cough and shortness of breath.   Cardiovascular: Negative for chest pain and leg swelling.  Gastrointestinal: Negative for nausea, vomiting, abdominal pain, diarrhea and constipation.  Musculoskeletal: Positive for myalgias and arthralgias.  Skin: Negative for color change and rash.  Hematological: Negative for adenopathy. Does not bruise/bleed easily.  Psychiatric/Behavioral: Positive for sleep disturbance. Negative for dysphoric mood. The patient is not nervous/anxious.        Objective:    BP 122/76 mmHg  Pulse 86  Ht 5\' 4"  (1.626 m)  Wt 178 lb 1.9 oz (80.795 kg)  BMI 30.56 kg/m2  SpO2 97% Physical Exam  Constitutional: She is oriented to person, place, and time. She appears well-developed and well-nourished. No distress.  HENT:  Head: Normocephalic and atraumatic.  Right Ear: Tympanic  membrane and external ear normal.  Left Ear: Tympanic membrane and external ear normal.  Nose: Nose normal.  Mouth/Throat: Oropharynx is clear and moist. No oropharyngeal exudate.  Eyes: Conjunctivae are normal. Pupils are equal, round, and reactive to light. Right eye exhibits no discharge. Left eye exhibits no discharge. No scleral icterus.  Neck: Normal range of motion. Neck supple. No tracheal deviation present. No thyromegaly present.  Cardiovascular: Normal rate, regular rhythm, normal heart sounds and intact distal pulses.  Exam reveals no gallop and no friction rub.   No murmur heard. Pulmonary/Chest: Effort normal and breath  sounds normal. No respiratory distress. She has no wheezes. She has no rales. She exhibits no tenderness.  Musculoskeletal: Normal range of motion. She exhibits no edema or tenderness.  Lymphadenopathy:    She has no cervical adenopathy.  Neurological: She is alert and oriented to person, place, and time. No cranial nerve deficit. She exhibits normal muscle tone. Coordination normal.  Skin: Skin is warm and dry. No rash noted. She is not diaphoretic. No erythema. No pallor.  Psychiatric: She has a normal mood and affect. Her behavior is normal. Judgment and thought content normal.          Assessment & Plan:   Problem List Items Addressed This Visit      Unprioritized   Back pain    Chronic back pain. No real improvement with PT. Will continue Cymbalta daily.      Relevant Medications   DULoxetine (CYMBALTA) 60 MG capsule   Diabetes type 2, uncontrolled ([place]) - Primary    No improvement in BG with 100mg  dose of Invokana. Will increase to 300mg  daily. Electrolytes today. Follow up in 03/2016.      Relevant Medications   canagliflozin (INVOKANA) 300 MG TABS tablet   Other Relevant Orders   Basic Metabolic Panel (BMET)   Insomnia    Symptoms improved with Trazodone. Will continue.      Relevant Medications   traZODone (DESYREL) 100 MG tablet   Other fatigue    Chronic fatigue. Likely multifactorial. Extensive workup including labs, cardiac and pulmonary evaluation. Sleep study was abnormal and CPAP titration pending. Encouraged her to follow through with this.      Sinusitis, chronic    Chronic sinus congestion. Encouraged her to follow up with ENT. Question if direct visualization or CT sinus would be helpful.      Relevant Medications   fluconazole (DIFLUCAN) 150 MG tablet       Return in about 9 weeks (around 04/19/2016) for Recheck of Diabetes.  Ronette Deter, MD Internal Medicine [facility]

## 2016-02-16 NOTE — Patient Instructions (Addendum)
Increase Invokana to 300mg  daily.  Labs today.  Take Diflucan 150mg  weekly x 3 weeks.  Try Claritin or Allegra in place of Zyrtec if continued fatigue.

## 2016-02-16 NOTE — Assessment & Plan Note (Signed)
Chronic back pain. No real improvement with PT. Will continue Cymbalta daily.

## 2016-02-16 NOTE — Assessment & Plan Note (Signed)
Symptoms improved with Trazodone. Will continue.

## 2016-02-16 NOTE — Assessment & Plan Note (Signed)
Chronic fatigue. Likely multifactorial. Extensive workup including labs, cardiac and pulmonary evaluation. Sleep study was abnormal and CPAP titration pending. Encouraged her to follow through with this.

## 2016-02-16 NOTE — Progress Notes (Signed)
Pre visit review using our clinic review tool, if applicable. No additional management support is needed unless otherwise documented below in the visit note. 

## 2016-02-16 NOTE — Assessment & Plan Note (Signed)
Chronic sinus congestion. Encouraged her to follow up with ENT. Question if direct visualization or CT sinus would be helpful.

## 2016-02-21 ENCOUNTER — Encounter: Payer: Self-pay | Admitting: Internal Medicine

## 2016-02-21 ENCOUNTER — Ambulatory Visit (INDEPENDENT_AMBULATORY_CARE_PROVIDER_SITE_OTHER): Payer: PPO | Admitting: Neurology

## 2016-02-21 ENCOUNTER — Encounter: Payer: Self-pay | Admitting: Neurology

## 2016-02-21 VITALS — BP 126/78 | HR 77 | Ht 64.0 in | Wt 181.2 lb

## 2016-02-21 DIAGNOSIS — R4189 Other symptoms and signs involving cognitive functions and awareness: Secondary | ICD-10-CM | POA: Diagnosis not present

## 2016-02-21 DIAGNOSIS — G44229 Chronic tension-type headache, not intractable: Secondary | ICD-10-CM | POA: Diagnosis not present

## 2016-02-21 NOTE — Patient Instructions (Signed)
1.  To evaluate the brain fog, we will schedule you for neuropsychological testing. 2.  To help treat the headaches, we will start tizanidine, which is a type of muscle relaxant.  We will slowly go up on the dose:  Take 2mg  at bedtime for 1 week  Then 2mg  twice daily for 1 week  Then 2mg  three times daily. Caution for drowsiness.  Do not use the cyclobenzaprine (we will use this instead). 3.  Follow up in 4 months or after the testing.

## 2016-02-21 NOTE — Progress Notes (Signed)
NEUROLOGY CONSULTATION NOTE  Shaneiqua Matalon MRN: [id number] DOB: [birth year]  Referring provider: Dr. Gilford Rile Primary care provider: Dr. Gilford Rile  Reason for consult:  Headache, "brain fog"  HISTORY OF PRESENT ILLNESS: Kristina Mccoy is a 60 year old right-handed woman with OSA on CPAP, type 2 diabetes, hyperlipidemia, Sjogren's, and history of chronic pain syndrome who presents for headache and cognitive problems.  History obtained by patient and PCP note.  HEADACHES: She started getting headaches in November 2016.  MRI of brain with and without contrast from 08/22/15 showed nonspecific non-enhancing white matter changes but nothing acute.  Headache location varies (temples, front, back of head, top).  It is a throbbing 3-5/10 pain.  Rarely, she may note a little nausea.  Otherwise, there is no associated symptoms.  They occur daily.  She takes Advil and they resolve in about 30 minutes.  She also occasionally has a stabbing pain behind her right eye.  COGNITIVE CLOUDING: She reports chronic "brain fog".  Symptoms escalated after she was treated with radiation and chemotherapy for breast cancer in 2014 and 2015, however she notes mild symptoms before then.  It is difficult for her to elaborate.  However, she often finds herself losing train of thought while in conversation and have to stop talking.  She also reports word-finding problems.  Sometimes, when she is doing something at home and gets side-tracked and stops, she will forget what she was doing when she is ready to return to it.  She sometimes feels disoriented when driving on familiar routes.  One time, she got into her car in a parking lot.  She meant to drive forward.  However, she accidentally had the car in reverse.  When she stepped on the gas, she drove backwards.  She was unable to figure out what happened and thought that something was wrong with the car.  She lives with a roommate.  She pays her own bills without difficulty.   She denies difficulty remembering names and faces of people she sees fairly often.  She denies family history of dementia.  She reports generalized fatigue and heaviness in her legs.  She has fibromyalgia, depression, and anxiety.  She has an autoimmune disease and was diagnosed with Sjogren's.  She takes alprazolam every evening and occasionally during the day.  She takes cyclobenzaprine occasionally for muscle spasms.  Labs:  ANA positive, Sed Rate 22, CRP 2, RF negative, TSH 1.19, B12 438. She has OSA and uses a CPAP.   PAST MEDICAL HISTORY: Past Medical History  Diagnosis Date  . Otitis media, chronic   . Diabetes mellitus   . Fibromyalgia   . Breast cancer ([place]) 5/14    left breast  . Lymphedema of arm     right  . Hyperlipidemia   . Heart murmur   . Diverticulosis   . Peptic ulcer   . Sjogren's disease ([place])   . Breast cancer ([place]) 02/2013    Dr Laurance Flatten ([facility])    PAST SURGICAL HISTORY: Past Surgical History  Procedure Laterality Date  . Mastectomy Bilateral 03/23/13  . Breast surgery      MEDICATIONS: Current Outpatient Prescriptions on File Prior to Visit  Medication Sig Dispense Refill  . albuterol (PROVENTIL HFA;VENTOLIN HFA) 108 (90 BASE) MCG/ACT inhaler Inhale 2 puffs into the lungs every 6 (six) hours as needed for wheezing or shortness of breath. 1 Inhaler 2  . ALPRAZolam (XANAX) 0.5 MG tablet TAKE ONE TABLET BY MOUTH FOUR TIMES A DAY AS NEEDED FOR  ANXIETY 90 tablet 3  . canagliflozin (INVOKANA) 300 MG TABS tablet Take 1 tablet (300 mg total) by mouth daily before breakfast. 90 tablet 1  . cetirizine (ZYRTEC) 10 MG tablet Take 10 mg by mouth 2 (two) times daily.    . clobetasol ointment (TEMOVATE) [id number] % Apply 1 application topically 2 (two) times daily.    . cyclobenzaprine (FLEXERIL) 5 MG tablet TAKE ONE TABLET BY MOUTH THREE TIMES DAILY AS NEEDED FOR MUSCLE SPASM 30 tablet 3  . DULoxetine (CYMBALTA) 60 MG capsule Take 1 capsule (60 mg total) by mouth  daily. 30 capsule 3  . Emollient (CERAVE) CREA Apply topically.    . ergocalciferol (VITAMIN D2) 50000 units capsule Take 1 capsule (50,000 Units total) by mouth once a week. 12 capsule 0  . fluconazole (DIFLUCAN) 150 MG tablet Take 1 tablet (150 mg total) by mouth once a week. 3 tablet 0  . hyoscyamine (LEVSIN, ANASPAZ) 0.125 MG tablet TAKE ONE TABLET BY MOUTH EVERY 4 HOURS AS NEEDED 30 tablet 3  . ONE TOUCH ULTRA TEST test strip CHECK SIX TIMES DAILY 100 each 11  . ONETOUCH DELICA LANCETS 99991111 MISC TWICE DAILY 100 each 3  . pantoprazole (PROTONIX) 40 MG tablet Take 1 tablet (40 mg total) by mouth 2 (two) times daily. 60 tablet 3  . prochlorperazine (COMPAZINE) 10 MG tablet Take 10 mg by mouth every 6 (six) hours as needed.    . ranitidine (ZANTAC) 300 MG tablet Take 1 tablet (300 mg total) by mouth daily. 90 tablet 3  . tamoxifen (NOLVADEX) 20 MG tablet Take 20 mg by mouth daily.    . traZODone (DESYREL) 100 MG tablet Take 1 tablet (100 mg total) by mouth at bedtime as needed for sleep. 30 tablet 3   No current facility-administered medications on file prior to visit.    ALLERGIES: Allergies  Allergen Reactions  . Gabapentin Itching and Swelling  . Buprenorphine Hcl   . Carbamazepine Nausea And Vomiting and Other (See Comments)    Abdominal pain   . Methylprednisolone     Reddened face, puffy face   . Morphine And Related   . Pregabalin Swelling    Swelling in arms and legs  . Nasonex [Mometasone Furoate] Rash    Facial rash    FAMILY HISTORY: Family History  Problem Relation Age of Onset  . Diabetes Mother   . Heart disease Mother   . Hyperlipidemia Mother   . Heart failure Mother   . Heart disease Brother   . Hyperlipidemia Brother   . Diabetes Other   . Colon polyps    . Stroke Maternal Grandmother     SOCIAL HISTORY: Social History   Social History  . Marital Status: Single    Spouse Name: N/A  . Number of Children: N/A  . Years of Education: N/A    Occupational History  . disabled    Social History Main Topics  . Smoking status: Former Smoker -- 0.50 packs/day    Types: Cigarettes    Quit date: 02/28/2013  . Smokeless tobacco: Never Used  . Alcohol Use: No  . Drug Use: No  . Sexual Activity: Not on file   Other Topics Concern  . Not on file   Social History Narrative    REVIEW OF SYSTEMS: Constitutional: fatigue Eyes: No visual changes, double vision, eye pain Ear, nose and throat: No hearing loss, ear pain, nasal congestion, sore throat Cardiovascular: No chest pain, palpitations Respiratory:  No shortness of  breath at rest or with exertion, wheezes GastrointestinaI: No nausea, vomiting, diarrhea, abdominal pain, fecal incontinence Genitourinary:  No dysuria, urinary retention or frequency Musculoskeletal:  neck pain, back pain Integumentary: No rash, pruritus, skin lesions Neurological: as above Psychiatric: depression, anxiety Endocrine: No palpitations, diaphoresis, mood swings, change in appetite, change in weight, increased thirst Hematologic/Lymphatic:  No purpura, petechiae. Allergic/Immunologic: no itchy/runny eyes, nasal congestion, recent allergic reactions, rashes  PHYSICAL EXAM: Filed Vitals:   02/21/16 1017  BP: 126/78  Pulse: 77   General: No acute distress.  Patient appears well-groomed.  Head:  Normocephalic/atraumatic Eyes:  fundi examined but not visualized Neck: supple, no paraspinal tenderness, full range of motion Back: No paraspinal tenderness Heart: regular rate and rhythm Lungs: Clear to auscultation bilaterally. Vascular: No carotid bruits. Neurological Exam: Mental status: alert and oriented to person, place, and time, recent and remote memory intact, fund of knowledge intact, attention and concentration intact, speech fluent and not dysarthric, language intact. MMSE - Mini Mental State Exam 02/21/2016  Orientation to time 4  Orientation to Place 5  Registration 3  Attention/  Calculation 4  Recall 3  Language- name 2 objects 2  Language- repeat 1  Language- follow 3 step command 3  Language- read & follow direction 1  Write a sentence 1  Copy design 1  Total score 28   Cranial nerves: CN I: not tested CN II: pupils equal, round and reactive to light, visual fields intact CN III, IV, VI:  full range of motion, no nystagmus, no ptosis CN V: facial sensation intact CN VII: upper and lower face symmetric CN VIII: hearing intact CN IX, X: gag intact, uvula midline CN XI: sternocleidomastoid and trapezius muscles intact CN XII: tongue midline Bulk & Tone: normal, no fasciculations. Motor:  5/5 throughout  Sensation: temperature and vibration sensation intact. Deep Tendon Reflexes:  2+ throughout, toes downgoing.  Finger to nose testing:  Without dysmetria.  Heel to shin:  Without dysmetria.  Gait:  Normal station and stride.  Able to turn but has some difficulty with tandem walk. Romberg negative.  IMPRESSION: 1.  Chronic tension-type headache complicated by medication-overuse 2.  Cognitive clouding.  I suspect it is related to depression or fibromyalgia.  She says she does not take alprazolam or cyclobenzaprine often.  If OSA is uncontrolled, that may play a role as well.  PLAN: 1.  She has history of side effects to neuroleptic medications.  We will start tizanidine 2mg  at bedtime and titrate to three times daily to treat the headache.  She will discontinue cyclobenzaprine.  Caution for drowsiness. 2.  Will refer for neuropsychological testing 3.  Follow up with sleep medicine to make sure CPAP settings are correct. 4.  Follow up in 4 months or after neuropsychological testing.  Thank you for allowing me to take part in the care of this patient.  Metta Clines, DO  CC: Ronette Deter, MD

## 2016-03-13 ENCOUNTER — Ambulatory Visit: Payer: PPO | Admitting: Internal Medicine

## 2016-03-13 DIAGNOSIS — L218 Other seborrheic dermatitis: Secondary | ICD-10-CM | POA: Diagnosis not present

## 2016-03-13 DIAGNOSIS — B368 Other specified superficial mycoses: Secondary | ICD-10-CM | POA: Diagnosis not present

## 2016-03-26 ENCOUNTER — Ambulatory Visit: Payer: PPO

## 2016-03-27 ENCOUNTER — Ambulatory Visit: Payer: PPO | Admitting: Internal Medicine

## 2016-04-09 ENCOUNTER — Ambulatory Visit: Payer: PPO | Attending: Pulmonary Disease

## 2016-04-09 DIAGNOSIS — G4733 Obstructive sleep apnea (adult) (pediatric): Secondary | ICD-10-CM | POA: Diagnosis not present

## 2016-04-16 ENCOUNTER — Telehealth: Payer: Self-pay | Admitting: *Deleted

## 2016-04-16 DIAGNOSIS — G4733 Obstructive sleep apnea (adult) (pediatric): Secondary | ICD-10-CM

## 2016-04-16 NOTE — Telephone Encounter (Signed)
LMOM for pt to return call in regards to CPAP Titration study. Will await call back.

## 2016-04-17 NOTE — Telephone Encounter (Signed)
Pt informed order for CPAP being ordered. Order placed.

## 2016-04-19 ENCOUNTER — Encounter: Payer: Self-pay | Admitting: Internal Medicine

## 2016-04-19 ENCOUNTER — Other Ambulatory Visit: Payer: Self-pay | Admitting: Internal Medicine

## 2016-04-19 ENCOUNTER — Ambulatory Visit (INDEPENDENT_AMBULATORY_CARE_PROVIDER_SITE_OTHER): Payer: PPO | Admitting: Internal Medicine

## 2016-04-19 VITALS — BP 140/76 | HR 72 | Ht 64.0 in | Wt 181.0 lb

## 2016-04-19 DIAGNOSIS — R1013 Epigastric pain: Secondary | ICD-10-CM

## 2016-04-19 DIAGNOSIS — Z794 Long term (current) use of insulin: Secondary | ICD-10-CM

## 2016-04-19 DIAGNOSIS — E559 Vitamin D deficiency, unspecified: Secondary | ICD-10-CM

## 2016-04-19 DIAGNOSIS — E1142 Type 2 diabetes mellitus with diabetic polyneuropathy: Secondary | ICD-10-CM | POA: Diagnosis not present

## 2016-04-19 DIAGNOSIS — E1165 Type 2 diabetes mellitus with hyperglycemia: Secondary | ICD-10-CM | POA: Diagnosis not present

## 2016-04-19 DIAGNOSIS — G44229 Chronic tension-type headache, not intractable: Secondary | ICD-10-CM | POA: Diagnosis not present

## 2016-04-19 DIAGNOSIS — IMO0002 Reserved for concepts with insufficient information to code with codable children: Secondary | ICD-10-CM

## 2016-04-19 DIAGNOSIS — G894 Chronic pain syndrome: Secondary | ICD-10-CM

## 2016-04-19 LAB — VITAMIN D 25 HYDROXY (VIT D DEFICIENCY, FRACTURES): VITD: 43.56 ng/mL (ref 30.00–100.00)

## 2016-04-19 LAB — COMPREHENSIVE METABOLIC PANEL
ALT: 31 U/L (ref 0–35)
AST: 23 U/L (ref 0–37)
Albumin: 4.4 g/dL (ref 3.5–5.2)
Alkaline Phosphatase: 91 U/L (ref 39–117)
BUN: 15 mg/dL (ref 6–23)
CO2: 29 mEq/L (ref 19–32)
Calcium: 9.8 mg/dL (ref 8.4–10.5)
Chloride: 95 mEq/L — ABNORMAL LOW (ref 96–112)
Creatinine, Ser: 0.86 mg/dL (ref 0.40–1.20)
GFR: 71.97 mL/min (ref >60.00)
Glucose, Bld: 267 mg/dL — ABNORMAL HIGH (ref 70–99)
Potassium: 4.2 mEq/L (ref 3.5–5.1)
Sodium: 132 mEq/L — ABNORMAL LOW (ref 135–145)
Total Bilirubin: 0.4 mg/dL (ref 0.2–1.2)
Total Protein: 8.2 g/dL (ref 6.0–8.3)

## 2016-04-19 LAB — HEMOGLOBIN A1C: Hgb A1c MFr Bld: 9.3 % — ABNORMAL HIGH (ref 4.6–6.5)

## 2016-04-19 MED ORDER — ONETOUCH DELICA LANCETS 33G MISC: Status: DC

## 2016-04-19 MED ORDER — TIZANIDINE HCL 2 MG PO TABS: 2.0000 mg | ORAL_TABLET | Freq: Three times a day (TID) | ORAL | Status: DC | PRN

## 2016-04-19 MED ORDER — HYOSCYAMINE SULFATE 0.125 MG PO TABS: ORAL_TABLET | ORAL | Status: DC

## 2016-04-19 MED ORDER — GLUCOSE BLOOD VI STRP: ORAL_STRIP | Status: DC

## 2016-04-19 MED ORDER — GLIPIZIDE 10 MG PO TABS: 10.0000 mg | ORAL_TABLET | Freq: Two times a day (BID) | ORAL | Status: DC

## 2016-04-19 MED ORDER — PANTOPRAZOLE SODIUM 40 MG PO TBEC: 40.0000 mg | DELAYED_RELEASE_TABLET | Freq: Two times a day (BID) | ORAL | Status: DC

## 2016-04-19 MED ORDER — INSULIN NPH (HUMAN) (ISOPHANE) 100 UNIT/ML ~~LOC~~ SUSP: 5.0000 [IU] | Freq: Two times a day (BID) | SUBCUTANEOUS | Status: DC

## 2016-04-19 NOTE — Patient Instructions (Signed)
Labs today

## 2016-04-19 NOTE — Assessment & Plan Note (Signed)
Chronic pain syndrome, likely related to underlying autoimmune disease. Followed by rheumatology. Off pain medication. Continue supportive care.

## 2016-04-19 NOTE — Progress Notes (Signed)
Subjective:    Patient ID: Kristina Mccoy, female    DOB: Jan 29, 1956, 60 y.o.   MRN: [id number]  HPI  60YO female presents for follow up.  DM - Started on Invokana, but had rash. Stopped Invokana. BG have been better by report but did not bring record today. Using Glipizide only.   Lab Results  Component Value Date   HGBA1C 8.5* 01/26/2016   Also recently seen by neurology. Started on Zanaflex, but never received Rx.Durene Cal for neurocognitive testing.  Recently seen by dermatology. Treated with topical steroid and oral diflucan. Symptoms of rash and itching have resolved.  Wt Readings from Last 3 Encounters:  04/19/16 181 lb (82.101 kg)  02/21/16 181 lb 3.2 oz (82.192 kg)  02/16/16 178 lb 1.9 oz (80.795 kg)   BP Readings from Last 3 Encounters:  04/19/16 140/76  02/21/16 126/78  02/16/16 122/76    Past Medical History  Diagnosis Date  . Otitis media, chronic   . Diabetes mellitus   . Fibromyalgia   . Breast cancer ([place]) 5/14    left breast  . Lymphedema of arm     right  . Hyperlipidemia   . Heart murmur   . Diverticulosis   . Peptic ulcer   . Sjogren's disease ([place])   . Breast cancer ([place]) 02/2013    Dr Laurance Flatten ([place])   Family History  Problem Relation Age of Onset  . Diabetes Mother   . Heart disease Mother   . Hyperlipidemia Mother   . Heart failure Mother   . Heart disease Brother   . Hyperlipidemia Brother   . Diabetes Other   . Colon polyps    . Stroke Maternal Grandmother    Past Surgical History  Procedure Laterality Date  . Mastectomy Bilateral 03/23/13  . Breast surgery     Social History   Social History  . Marital Status: Single    Spouse Name: N/A  . Number of Children: N/A  . Years of Education: N/A   Occupational History  . disabled    Social History Main Topics  . Smoking status: Former Smoker -- 0.50 packs/day    Types: Cigarettes    Quit date: 02/28/2013  . Smokeless tobacco: Never Used  . Alcohol Use: No  .  Drug Use: No  . Sexual Activity: Not Asked   Other Topics Concern  . None   Social History Narrative    Review of Systems  Constitutional: Negative for fever, chills, appetite change, fatigue and unexpected weight change.  Eyes: Negative for visual disturbance.  Respiratory: Negative for cough and shortness of breath.   Cardiovascular: Negative for chest pain, palpitations and leg swelling.  Gastrointestinal: Negative for nausea, vomiting, abdominal pain, diarrhea and constipation.  Musculoskeletal: Positive for myalgias and arthralgias.  Skin: Negative for color change and rash.  Hematological: Negative for adenopathy. Does not bruise/bleed easily.  Psychiatric/Behavioral: Negative for suicidal ideas, sleep disturbance and dysphoric mood. The patient is not nervous/anxious.        Objective:    BP 140/76 mmHg  Pulse 72  Ht 5\' 4"  (1.626 m)  Wt 181 lb (82.101 kg)  BMI 31.05 kg/m2  SpO2 99% Physical Exam  Constitutional: She is oriented to person, place, and time. She appears well-developed and well-nourished. No distress.  HENT:  Head: Normocephalic and atraumatic.  Right Ear: External ear normal.  Left Ear: External ear normal.  Nose: Nose normal.  Mouth/Throat: Oropharynx is clear and moist. No oropharyngeal exudate.  Eyes:  Conjunctivae are normal. Pupils are equal, round, and reactive to light. Right eye exhibits no discharge. Left eye exhibits no discharge. No scleral icterus.  Neck: Normal range of motion. Neck supple. No tracheal deviation present. No thyromegaly present.  Cardiovascular: Normal rate, regular rhythm, normal heart sounds and intact distal pulses.  Exam reveals no gallop and no friction rub.   No murmur heard. Pulmonary/Chest: Effort normal and breath sounds normal. No respiratory distress. She has no wheezes. She has no rales. She exhibits no tenderness.  Musculoskeletal: Normal range of motion. She exhibits no edema or tenderness.  Lymphadenopathy:     She has no cervical adenopathy.  Neurological: She is alert and oriented to person, place, and time. No cranial nerve deficit. She exhibits normal muscle tone. Coordination normal.  Skin: Skin is warm and dry. No rash noted. She is not diaphoretic. No erythema. No pallor.  Psychiatric: She has a normal mood and affect. Her behavior is normal. Judgment and thought content normal.          Assessment & Plan:   Problem List Items Addressed This Visit      High   Chronic pain disorder (Chronic)    Chronic pain syndrome, likely related to underlying autoimmune disease. Followed by rheumatology. Off pain medication. Continue supportive care.      Diabetes type 2, uncontrolled ([place]) - Primary (Chronic)    Did not bring record of BG. Unable to tolerate Lantus or Invokana. On Glipizide alone. A1c with labs.      Relevant Medications   glipiZIDE (GLUCOTROL) 10 MG tablet   Other Relevant Orders   Comprehensive metabolic panel   Hemoglobin A1c     Unprioritized   Cephalalgia (Chronic)    Reviewed notes from Neurology. Per notes, plan was to try Zanaflex. Will call this in. Discussed potential risks of this medications.      Relevant Medications   tiZANidine (ZANAFLEX) 2 MG tablet   Vitamin D deficiency    Repeat Vit D with labs today.      Relevant Orders   VITAMIN D 25 Hydroxy (Vit-D Deficiency, Fractures)    Other Visit Diagnoses    Abdominal pain, epigastric        Relevant Medications    pantoprazole (PROTONIX) 40 MG tablet        Return in about 4 weeks (around 05/17/2016) for New Patient.  Ronette Deter, MD Internal Medicine [facility]

## 2016-04-19 NOTE — Addendum Note (Signed)
Addended by: Ronette Deter A on: 04/19/2016 04:27 PM   Modules accepted: Orders

## 2016-04-19 NOTE — Progress Notes (Signed)
Pre visit review using our clinic review tool, if applicable. No additional management support is needed unless otherwise documented below in the visit note. 

## 2016-04-19 NOTE — Assessment & Plan Note (Signed)
Did not bring record of BG. Unable to tolerate Lantus or Invokana. On Glipizide alone. A1c with labs.

## 2016-04-19 NOTE — Assessment & Plan Note (Signed)
Reviewed notes from Neurology. Per notes, plan was to try Zanaflex. Will call this in. Discussed potential risks of this medications.

## 2016-04-19 NOTE — Assessment & Plan Note (Signed)
Repeat Vit D with labs today.

## 2016-04-22 ENCOUNTER — Ambulatory Visit: Payer: Self-pay | Admitting: Internal Medicine

## 2016-04-22 ENCOUNTER — Other Ambulatory Visit: Payer: Self-pay | Admitting: Internal Medicine

## 2016-04-22 ENCOUNTER — Encounter: Payer: Self-pay | Admitting: Internal Medicine

## 2016-04-22 DIAGNOSIS — L218 Other seborrheic dermatitis: Secondary | ICD-10-CM | POA: Diagnosis not present

## 2016-04-22 DIAGNOSIS — B368 Other specified superficial mycoses: Secondary | ICD-10-CM | POA: Diagnosis not present

## 2016-04-23 ENCOUNTER — Telehealth: Payer: Self-pay

## 2016-04-23 MED ORDER — DICYCLOMINE HCL 10 MG PO CAPS: 10.0000 mg | ORAL_CAPSULE | Freq: Three times a day (TID) | ORAL | 1 refills | Status: DC | PRN

## 2016-04-23 NOTE — Telephone Encounter (Signed)
PA for Humulin N started on Cover my meds.

## 2016-04-25 ENCOUNTER — Encounter: Payer: Self-pay | Admitting: Internal Medicine

## 2016-04-26 NOTE — Telephone Encounter (Signed)
Humulin PA approved from 04/25/2016-09/29/2016. thanks

## 2016-04-30 ENCOUNTER — Other Ambulatory Visit: Payer: Self-pay | Admitting: *Deleted

## 2016-04-30 DIAGNOSIS — G4733 Obstructive sleep apnea (adult) (pediatric): Secondary | ICD-10-CM

## 2016-04-30 DIAGNOSIS — Z9989 Dependence on other enabling machines and devices: Principal | ICD-10-CM

## 2016-05-06 ENCOUNTER — Telehealth: Payer: Self-pay | Admitting: Internal Medicine

## 2016-05-06 DIAGNOSIS — G4733 Obstructive sleep apnea (adult) (pediatric): Secondary | ICD-10-CM

## 2016-05-06 NOTE — Telephone Encounter (Signed)
Pt is calling wanting to know if oxygen is needed with CPAP?  Pt states that during her CPAP Titration Study, Sleep Med did not place her on o2 with her CPAP.   Order has been placed for CPAP with O2 to be bled into CPAP.   Patient states that she has not been using o2 which is with [facility] in [place].   Please advise. [name]

## 2016-05-08 NOTE — Telephone Encounter (Signed)
Please check orders and see this patient should be on O2 at night.  I cannot recall.  Thank you

## 2016-05-09 NOTE — Telephone Encounter (Signed)
Pt told me previously that she wears O2 at night at 2L stating you told her she needed it after her sleep study. On her most recent titration report they didn't give O2 and she didn't desat. Please advise.

## 2016-05-10 ENCOUNTER — Ambulatory Visit: Payer: Self-pay | Admitting: Internal Medicine

## 2016-05-10 NOTE — Telephone Encounter (Signed)
Spoke with pt and she has not had an ONO. Will place order for ONO and informed pt she will not wear her CPAP that night. Pt agrees. Nothing further needed.

## 2016-05-10 NOTE — Telephone Encounter (Signed)
LMOM for pt to return call. 

## 2016-05-10 NOTE — Telephone Encounter (Signed)
Let's get an ONO, if she doesn't already have one.   Thanks. VM

## 2016-05-15 DIAGNOSIS — E663 Overweight: Secondary | ICD-10-CM | POA: Diagnosis not present

## 2016-05-15 DIAGNOSIS — E1165 Type 2 diabetes mellitus with hyperglycemia: Secondary | ICD-10-CM | POA: Diagnosis not present

## 2016-05-15 DIAGNOSIS — Z794 Long term (current) use of insulin: Secondary | ICD-10-CM | POA: Diagnosis not present

## 2016-05-15 DIAGNOSIS — Z789 Other specified health status: Secondary | ICD-10-CM | POA: Diagnosis not present

## 2016-06-07 ENCOUNTER — Other Ambulatory Visit: Payer: Self-pay | Admitting: Family Medicine

## 2016-06-07 ENCOUNTER — Other Ambulatory Visit: Payer: Self-pay | Admitting: Surgical

## 2016-06-07 ENCOUNTER — Ambulatory Visit (INDEPENDENT_AMBULATORY_CARE_PROVIDER_SITE_OTHER): Payer: PPO

## 2016-06-07 ENCOUNTER — Ambulatory Visit (INDEPENDENT_AMBULATORY_CARE_PROVIDER_SITE_OTHER): Payer: PPO | Admitting: Family Medicine

## 2016-06-07 ENCOUNTER — Encounter: Payer: Self-pay | Admitting: Family Medicine

## 2016-06-07 VITALS — BP 112/66 | HR 84 | Temp 97.7°F | Ht 64.0 in | Wt 185.0 lb

## 2016-06-07 DIAGNOSIS — R0600 Dyspnea, unspecified: Secondary | ICD-10-CM

## 2016-06-07 DIAGNOSIS — E1165 Type 2 diabetes mellitus with hyperglycemia: Secondary | ICD-10-CM

## 2016-06-07 DIAGNOSIS — R05 Cough: Secondary | ICD-10-CM

## 2016-06-07 DIAGNOSIS — F419 Anxiety disorder, unspecified: Secondary | ICD-10-CM | POA: Diagnosis not present

## 2016-06-07 DIAGNOSIS — G894 Chronic pain syndrome: Secondary | ICD-10-CM | POA: Diagnosis not present

## 2016-06-07 DIAGNOSIS — R059 Cough, unspecified: Secondary | ICD-10-CM

## 2016-06-07 DIAGNOSIS — E1142 Type 2 diabetes mellitus with diabetic polyneuropathy: Secondary | ICD-10-CM | POA: Diagnosis not present

## 2016-06-07 DIAGNOSIS — IMO0002 Reserved for concepts with insufficient information to code with codable children: Secondary | ICD-10-CM

## 2016-06-07 DIAGNOSIS — J069 Acute upper respiratory infection, unspecified: Secondary | ICD-10-CM

## 2016-06-07 DIAGNOSIS — R0689 Other abnormalities of breathing: Principal | ICD-10-CM

## 2016-06-07 DIAGNOSIS — Z794 Long term (current) use of insulin: Secondary | ICD-10-CM

## 2016-06-07 MED ORDER — ALBUTEROL SULFATE HFA 108 (90 BASE) MCG/ACT IN AERS: 2.0000 | INHALATION_SPRAY | Freq: Four times a day (QID) | RESPIRATORY_TRACT | 0 refills | Status: DC | PRN

## 2016-06-07 NOTE — Patient Instructions (Signed)
Nice to meet you. Your symptoms of cough and sinus congestion are likely related to a viral illness which could be causing some bronchitis as well. We will check a chest x-ray to evaluate for underlying lung process. He should use her albuterol inhaler every 6 hours for the next 2 days. Please consider referral to Dr Sharlet Salina at [facility] for your back pain. Please continue your current diabetes regimen as advised by your endocrinologist. If you develop fevers, cough productive of blood, chest pain, increasing shortness of breath, or any new or changing symptoms please seek medical attention.

## 2016-06-07 NOTE — Progress Notes (Signed)
Pre visit review using our clinic review tool, if applicable. No additional management support is needed unless otherwise documented below in the visit note. 

## 2016-06-08 DIAGNOSIS — J069 Acute upper respiratory infection, unspecified: Secondary | ICD-10-CM | POA: Insufficient documentation

## 2016-06-08 NOTE — Assessment & Plan Note (Signed)
Patient's symptoms most consistent with viral upper respiratory infection. Possible bronchitis as well though lung exam is normal. Unlikely to be pneumonia. Unlikely to be VTE. Unlikely cardiac cause. Given history of pneumonia and bronchitis we will obtain a chest x-ray to ensure no underlying pneumonia. She has not been using her albuterol inhaler and given that she will use this every 6 hours for the next 2 days then as needed. I discussed treating with prednisone though she declined this. If pneumonia on chest x-ray we will treat with antibiotics. If not she'll continue to monitor. Could consider azithromycin sometime next week for bronchitis if not improving. She is given return precautions.

## 2016-06-08 NOTE — Assessment & Plan Note (Signed)
Sugars appear somewhat better controlled than previously per her report. She'll continue to follow with endocrinology.

## 2016-06-08 NOTE — Assessment & Plan Note (Signed)
Continues to have issues with chronic pain. Given chronic back pain I suggested seeing physiatry for this though she is unsure. She'll continue to monitor.

## 2016-06-08 NOTE — Progress Notes (Signed)
[name], MD Phone: [phone]  Kristina Mccoy is a 60 y.o. female who presents today for follow-up.  Patient notes for the last several days she's had cough and congestion in her sinuses and nose. Notes no chest congestion. Notes some mild trouble breathing with feeling as though it's hard to get a deep breath, though she notes she has baseline shortness of breath. Had some mild back discomfort with this yesterday though none today. No chest pain. Cough is nonproductive. No fevers. No history of blood clot. No recent surgeries. Has a history of radiation fibrosis. In the past has had pneumonia and bronchitis frequently.  Chronic low back pain: Notes radiates to her hips bilaterally. At some point in the last several weeks it felt as though her left hip locked though this has resolved. No numbness or weakness in her legs. No saddle anesthesia. No bowel or bladder incontinence. No fevers. Currently doing Tylenol and tumeric.  Anxiety: Everything makes her anxious. She occasionally has panic attacks. Is taking Xanax 3 times a day. This does not make her too drowsy. Does not drive when she takes it. No depression.  DIABETES-followed by endocrinology Disease Monitoring: Blood Sugar ranges-83-216 Polyuria/phagia/dipsia- no       Medications: Compliance- taking NPH 10 units in the morning and 18 units at night Hypoglycemic symptoms- rare, gets shaky and sweaty, eats and feels better  PMH: Former smoker.   ROS see history of present illness  Objective  Physical Exam Vitals:   06/07/16 1323  BP: 112/66  Pulse: 84  Temp: 97.7 F (36.5 C)    BP Readings from Last 3 Encounters:  06/07/16 112/66  04/19/16 140/76  02/21/16 126/78   Wt Readings from Last 3 Encounters:  06/07/16 185 lb (83.9 kg)  04/19/16 181 lb (82.1 kg)  02/21/16 181 lb 3.2 oz (82.2 kg)    Physical Exam  Constitutional: No distress.  HENT:  Head: Normocephalic and atraumatic.  Mouth/Throat: Oropharynx  is clear and moist. No oropharyngeal exudate.  Normal TMs bilaterally, no sinus tenderness to percussion  Eyes: Conjunctivae are normal. Pupils are equal, round, and reactive to light.  Neck: Neck supple.  Cardiovascular: Normal rate, regular rhythm and normal heart sounds.   Pulmonary/Chest: Effort normal and breath sounds normal.  Lymphadenopathy:    She has no cervical adenopathy.  Neurological: She is alert. Gait normal.  5 out of 5 strength bilateral quads, hamstrings, plantar flexion, and dorsiflexion, sensation to light touch intact bilateral lower extremities  Skin: Skin is warm and dry. She is not diaphoretic.  Psychiatric:  Mood anxious, affect anxious     Assessment/Plan: Please see individual problem list.  Diabetes type 2, uncontrolled ([place]) Sugars appear somewhat better controlled than previously per her report. She'll continue to follow with endocrinology.  Anxiety Stable though still has anxiety. She is not interested in trying anything other than Xanax at this time. Continue as needed Xanax.  Chronic pain disorder Continues to have issues with chronic pain. Given chronic back pain I suggested seeing physiatry for this though she is unsure. She'll continue to monitor.  Viral upper respiratory infection Patient's symptoms most consistent with viral upper respiratory infection. Possible bronchitis as well though lung exam is normal. Unlikely to be pneumonia. Unlikely to be VTE. Unlikely cardiac cause. Given history of pneumonia and bronchitis we will obtain a chest x-ray to ensure no underlying pneumonia. She has not been using her albuterol inhaler and given that she will use this every 6 hours for the  next 2 days then as needed. I discussed treating with prednisone though she declined this. If pneumonia on chest x-ray we will treat with antibiotics. If not she'll continue to monitor. Could consider azithromycin sometime next week for bronchitis if not improving. She is  given return precautions.   Orders Placed This Encounter  Procedures  . DG Chest 2 View    Standing Status:   Future    Number of Occurrences:   1    Standing Expiration Date:   08/07/2017    Order Specific Question:   Reason for Exam (SYMPTOM  OR DIAGNOSIS REQUIRED)    Answer:   cough, congestion, feels as though it is hard to get a deep breath    Order Specific Question:   Is patient pregnant?    Answer:   No    Order Specific Question:   Preferred imaging location?    Answer:   [facility]    [name], MD [place]

## 2016-06-08 NOTE — Assessment & Plan Note (Addendum)
Stable though still has anxiety. She is not interested in trying anything other than Xanax at this time. Continue as needed Xanax.

## 2016-06-10 ENCOUNTER — Telehealth: Payer: Self-pay | Admitting: Internal Medicine

## 2016-06-10 MED ORDER — AZITHROMYCIN 250 MG PO TABS: ORAL_TABLET | ORAL | 0 refills | Status: DC

## 2016-06-10 NOTE — Telephone Encounter (Signed)
Please advise for a antibiotic, thanks

## 2016-06-10 NOTE — Telephone Encounter (Signed)
Azithromycin sent to pharmacy. Please confirm that the patient has not developed any new symptoms. If she has she should be re-evaluated. If she develops new symptoms on the azithromycin she should be evaluated. If she does not improve with the azithromycin she should be evaluated. She should take a probiotic or eat some yogurt while on the azithromycin. Thanks.

## 2016-06-10 NOTE — Telephone Encounter (Signed)
Pt called stating that she was advised if she was not feeling better to call and Dr Caryl Bis would call a antibiotic in. Pt is not feeling better she is still having the same symptoms.   Pharmacy is [facility], [place]  Call pt @ [phone]. Thank you!

## 2016-06-11 NOTE — Telephone Encounter (Signed)
Left a VM for patient to return my call. thanks 

## 2016-06-12 ENCOUNTER — Encounter: Payer: Self-pay | Admitting: Family Medicine

## 2016-06-13 ENCOUNTER — Other Ambulatory Visit: Payer: Self-pay | Admitting: Family Medicine

## 2016-06-13 DIAGNOSIS — Z6831 Body mass index (BMI) 31.0-31.9, adult: Secondary | ICD-10-CM | POA: Diagnosis not present

## 2016-06-13 DIAGNOSIS — C50411 Malignant neoplasm of upper-outer quadrant of right female breast: Secondary | ICD-10-CM | POA: Diagnosis not present

## 2016-06-13 MED ORDER — AMOXICILLIN-POT CLAVULANATE 875-125 MG PO TABS: 1.0000 | ORAL_TABLET | Freq: Two times a day (BID) | ORAL | 0 refills | Status: DC

## 2016-06-13 MED ORDER — FLUCONAZOLE 150 MG PO TABS: 150.0000 mg | ORAL_TABLET | ORAL | 0 refills | Status: DC

## 2016-06-17 ENCOUNTER — Encounter: Payer: Self-pay | Admitting: Internal Medicine

## 2016-06-24 DIAGNOSIS — M3501 Sicca syndrome with keratoconjunctivitis: Secondary | ICD-10-CM | POA: Diagnosis not present

## 2016-06-24 DIAGNOSIS — M359 Systemic involvement of connective tissue, unspecified: Secondary | ICD-10-CM | POA: Diagnosis not present

## 2016-06-24 DIAGNOSIS — J301 Allergic rhinitis due to pollen: Secondary | ICD-10-CM | POA: Diagnosis not present

## 2016-06-24 DIAGNOSIS — F458 Other somatoform disorders: Secondary | ICD-10-CM | POA: Diagnosis not present

## 2016-06-24 DIAGNOSIS — M2550 Pain in unspecified joint: Secondary | ICD-10-CM | POA: Diagnosis not present

## 2016-06-24 DIAGNOSIS — G4733 Obstructive sleep apnea (adult) (pediatric): Secondary | ICD-10-CM | POA: Diagnosis not present

## 2016-06-24 DIAGNOSIS — K219 Gastro-esophageal reflux disease without esophagitis: Secondary | ICD-10-CM | POA: Diagnosis not present

## 2016-07-01 ENCOUNTER — Other Ambulatory Visit: Payer: Self-pay | Admitting: Surgical

## 2016-07-01 MED ORDER — ALPRAZOLAM 0.5 MG PO TABS: ORAL_TABLET | ORAL | 3 refills | Status: DC

## 2016-07-01 NOTE — Telephone Encounter (Signed)
RX faxed

## 2016-07-01 NOTE — Telephone Encounter (Signed)
Can we refill medication?

## 2016-07-01 NOTE — Telephone Encounter (Signed)
Prescription provided. Please fax.

## 2016-07-02 ENCOUNTER — Telehealth: Payer: Self-pay | Admitting: Family Medicine

## 2016-07-02 NOTE — Telephone Encounter (Signed)
RX has been faxed.

## 2016-07-02 NOTE — Telephone Encounter (Signed)
Pt called about her medication of ALPRAZolam (XANAX) 0.5 MG tablet not at the pharmacy. It does show that it was printed out and it's not in the box up front. Please advise?  Call pt @ [phone]. Thank you!

## 2016-07-04 DIAGNOSIS — E1165 Type 2 diabetes mellitus with hyperglycemia: Secondary | ICD-10-CM | POA: Diagnosis not present

## 2016-07-04 DIAGNOSIS — Z794 Long term (current) use of insulin: Secondary | ICD-10-CM | POA: Diagnosis not present

## 2016-07-10 DIAGNOSIS — E1165 Type 2 diabetes mellitus with hyperglycemia: Secondary | ICD-10-CM | POA: Diagnosis not present

## 2016-07-10 DIAGNOSIS — Z794 Long term (current) use of insulin: Secondary | ICD-10-CM | POA: Diagnosis not present

## 2016-07-16 DIAGNOSIS — G4733 Obstructive sleep apnea (adult) (pediatric): Secondary | ICD-10-CM | POA: Diagnosis not present

## 2016-07-31 ENCOUNTER — Encounter: Payer: Self-pay | Admitting: Family Medicine

## 2016-08-01 ENCOUNTER — Ambulatory Visit (INDEPENDENT_AMBULATORY_CARE_PROVIDER_SITE_OTHER): Payer: PPO | Admitting: Family Medicine

## 2016-08-01 VITALS — BP 120/72 | HR 78 | Temp 98.6°F | Wt 187.8 lb

## 2016-08-01 DIAGNOSIS — N3000 Acute cystitis without hematuria: Secondary | ICD-10-CM

## 2016-08-01 DIAGNOSIS — N390 Urinary tract infection, site not specified: Secondary | ICD-10-CM | POA: Insufficient documentation

## 2016-08-01 DIAGNOSIS — R300 Dysuria: Secondary | ICD-10-CM

## 2016-08-01 LAB — POCT URINALYSIS DIPSTICK
Bilirubin, UA: NEGATIVE
Blood, UA: NEGATIVE
Glucose, UA: NEGATIVE
Nitrite, UA: NEGATIVE
Protein, UA: NEGATIVE
Spec Grav, UA: 1.01
Urobilinogen, UA: 0.2
pH, UA: 6

## 2016-08-01 LAB — URINALYSIS, MICROSCOPIC ONLY

## 2016-08-01 MED ORDER — NITROFURANTOIN MONOHYD MACRO 100 MG PO CAPS: 100.0000 mg | ORAL_CAPSULE | Freq: Two times a day (BID) | ORAL | 0 refills | Status: DC

## 2016-08-01 NOTE — Progress Notes (Signed)
  [name], MD Phone: [phone]  Kristina Mccoy is a 60 y.o. female who presents today for same-day visit.  Patient notes 1 week of dysuria, urinary frequency, and urinary urgency. She notes a suprapubic spasm sensation and pressure sensation. No other abdominal pain. She notes a chronic history of spasms in her muscles particularly right ribs. No fevers. Some chills. No vaginal discharge. She was seen by rheumatology a little over a month ago and treated with Macrobid for UTI. She notes her symptoms improved completely with treatment at that time.Marland Kitchen  ROS see history of present illness  Objective  Physical Exam Vitals:   08/01/16 0823  BP: 120/72  Pulse: 78  Temp: 98.6 F (37 C)    BP Readings from Last 3 Encounters:  08/01/16 120/72  06/07/16 112/66  04/19/16 140/76   Wt Readings from Last 3 Encounters:  08/01/16 187 lb 12.8 oz (85.2 kg)  06/07/16 185 lb (83.9 kg)  04/19/16 181 lb (82.1 kg)    Physical Exam  Constitutional: She is well-developed, well-nourished, and in no distress.  Cardiovascular: Normal rate, regular rhythm and normal heart sounds.   Pulmonary/Chest: Effort normal and breath sounds normal.  No tenderness over the right lower ribs, no bony defects of the right lower ribs  Abdominal: Soft. Bowel sounds are normal. She exhibits no distension. There is no tenderness. There is no rebound and no guarding.  Neurological: She is alert. Gait normal.     Assessment/Plan: Please see individual problem list.  UTI (urinary tract infection) Patient's symptoms and UA most consistent with UTI. Benign abdominal exam. Stable vital signs. Suspect chronic right lower rib discomfort related to muscle spasms. She'll continue to monitor that. We'll treat her with Macrobid. She is given return precautions.   Orders Placed This Encounter  Procedures  . Urine Culture  . Urine Microscopic Only  . POCT Urinalysis Dipstick    Meds ordered this encounter    Medications  . nitrofurantoin, macrocrystal-monohydrate, (MACROBID) 100 MG capsule    Sig: Take 1 capsule (100 mg total) by mouth 2 (two) times daily.    Dispense:  14 capsule    Refill:  0    [name], MD [place]

## 2016-08-01 NOTE — Progress Notes (Signed)
Pre visit review using our clinic review tool, if applicable. No additional management support is needed unless otherwise documented below in the visit note. 

## 2016-08-01 NOTE — Patient Instructions (Signed)
Nice to see you. Your symptoms are likely related to UTI. We are going to treat you with Macrobid. If you develop pain, fevers, or any new or change in symptoms please seek medical attention immediately.

## 2016-08-01 NOTE — Assessment & Plan Note (Signed)
Patient's symptoms and UA most consistent with UTI. Benign abdominal exam. Stable vital signs. Suspect chronic right lower rib discomfort related to muscle spasms. She'll continue to monitor that. We'll treat her with Macrobid. She is given return precautions.

## 2016-08-03 LAB — URINE CULTURE: Organism ID, Bacteria: NO GROWTH

## 2016-08-07 ENCOUNTER — Encounter: Payer: Self-pay | Admitting: Family Medicine

## 2016-08-07 ENCOUNTER — Ambulatory Visit (INDEPENDENT_AMBULATORY_CARE_PROVIDER_SITE_OTHER): Payer: PPO | Admitting: Family Medicine

## 2016-08-07 VITALS — BP 122/66 | HR 84 | Temp 98.2°F | Wt 187.4 lb

## 2016-08-07 DIAGNOSIS — R5383 Other fatigue: Secondary | ICD-10-CM

## 2016-08-07 DIAGNOSIS — R5382 Chronic fatigue, unspecified: Secondary | ICD-10-CM | POA: Diagnosis not present

## 2016-08-07 DIAGNOSIS — R1030 Lower abdominal pain, unspecified: Secondary | ICD-10-CM

## 2016-08-07 DIAGNOSIS — R21 Rash and other nonspecific skin eruption: Secondary | ICD-10-CM | POA: Diagnosis not present

## 2016-08-07 DIAGNOSIS — R300 Dysuria: Secondary | ICD-10-CM | POA: Diagnosis not present

## 2016-08-07 LAB — COMPREHENSIVE METABOLIC PANEL
ALT: 64 U/L — ABNORMAL HIGH (ref 0–35)
AST: 44 U/L — ABNORMAL HIGH (ref 0–37)
Albumin: 4.4 g/dL (ref 3.5–5.2)
Alkaline Phosphatase: 132 U/L — ABNORMAL HIGH (ref 39–117)
BUN: 12 mg/dL (ref 6–23)
CO2: 30 mEq/L (ref 19–32)
Calcium: 9.7 mg/dL (ref 8.4–10.5)
Chloride: 96 mEq/L (ref 96–112)
Creatinine, Ser: 0.86 mg/dL (ref 0.40–1.20)
GFR: 71.89 mL/min (ref >60.00)
Glucose, Bld: 250 mg/dL — ABNORMAL HIGH (ref 70–99)
Potassium: 4.2 mEq/L (ref 3.5–5.1)
Sodium: 132 mEq/L — ABNORMAL LOW (ref 135–145)
Total Bilirubin: 0.3 mg/dL (ref 0.2–1.2)
Total Protein: 8.4 g/dL — ABNORMAL HIGH (ref 6.0–8.3)

## 2016-08-07 LAB — CBC
HCT: 42.1 % (ref 36.0–46.0)
Hemoglobin: 14.3 g/dL (ref 12.0–15.0)
MCHC: 33.9 g/dL (ref 30.0–36.0)
MCV: 92.6 fl (ref 78.0–100.0)
Platelets: 233 10*3/uL (ref 150.0–400.0)
RBC: 4.55 Mil/uL (ref 3.87–5.11)
RDW: 14.1 % (ref 11.5–15.5)
WBC: 6.2 10*3/uL (ref 4.0–10.5)

## 2016-08-07 LAB — URINALYSIS, MICROSCOPIC ONLY

## 2016-08-07 LAB — POCT URINALYSIS DIPSTICK
Bilirubin, UA: NEGATIVE
Blood, UA: NEGATIVE
Glucose, UA: 500
Ketones, UA: NEGATIVE
Nitrite, UA: NEGATIVE
Protein, UA: NEGATIVE
Spec Grav, UA: <=1.005
Urobilinogen, UA: 0.2
pH, UA: 6

## 2016-08-07 LAB — TSH: TSH: 0.77 u[IU]/mL (ref 0.35–4.50)

## 2016-08-07 LAB — SEDIMENTATION RATE: Sed Rate: 19 mm/hr (ref 0–30)

## 2016-08-07 LAB — VITAMIN B12: Vitamin B-12: 431 pg/mL (ref 211–911)

## 2016-08-07 NOTE — Patient Instructions (Signed)
Nice to see you. We are going to order some lab work and call you with the results. We will order CT scan to evaluate your stomach discomfort as well. Please follow up with your dermatologist for your rash. If you develop worsening abdominal discomfort, numbness, weakness, loss of bowel or bladder function, numbness between her legs, or any new or change in symptoms please seek medical attention immediately.

## 2016-08-07 NOTE — Progress Notes (Signed)
[name], MD Phone: [phone]  Kristina Mccoy is a 60 y.o. female who presents today for follow-up.  Patient was seen about a week ago with UTI symptoms. Urine culture returned negative. Symptoms have persisted. Minimally improved with antibiotics. She notes frequency and urgency. A pressure sensation and throbbing sensation in her bilateral lower abdomen. No fevers. No nausea, vomiting, or diarrhea. She had a little bit of vaginal discharge a week ago and notes some vaginal burning sensation as well. She declines pelvic exam today.  Patient's office visit was set up to evaluate her abdominal discomfort though we discussed several other issues. Patient notes she has chronic pain. Notes this is located throughout her entire body. She just aches. She is exhausted. Notes back pain from her neck down to her lower back. Notes no numbness, weakness, loss of bowel or bladder function, saddle anesthesia, or fevers. She notes her legs just ache. Ache mostly when she is lying down. No worsening with physical activity. She notes blotchiness to her skin. She sees rheumatology and they diagnosed her with fibromyalgia and autoimmune disorder. They placed her on Plaquenil although she does not want to take this due to the risks to her sight with this medicine. She has been tried on Lyrica and Cymbalta previously. She also notes she had a CT scan of her chest previously and they noted a possible cyst on her liver. She just does not understand why all the things are wrong with her. Nobody has been able to give her any answers. She does not feel depressed.  PMH: Former smoker   ROS see history of present illness  Objective  Physical Exam Vitals:   08/07/16 0952  BP: 122/66  Pulse: 84  Temp: 98.2 F (36.8 C)    BP Readings from Last 3 Encounters:  08/07/16 122/66  08/01/16 120/72  06/07/16 112/66   Wt Readings from Last 3 Encounters:  08/07/16 187 lb 6.4 oz (85 kg)  08/01/16 187 lb 12.8 oz  (85.2 kg)  06/07/16 185 lb (83.9 kg)    Physical Exam  Constitutional: No distress.  Cardiovascular: Normal rate, regular rhythm and normal heart sounds.   Pulmonary/Chest: Effort normal and breath sounds normal.  Abdominal: Soft. Bowel sounds are normal. She exhibits no distension. There is tenderness (mild tenderness suprapubic region). There is no rebound and no guarding.  Genitourinary:  Genitourinary Comments: Patient declined pelvic exam  Musculoskeletal: She exhibits no edema.  No midline spine tenderness, no midline spine step-off, there is diffuse muscular back tenderness  Neurological: She is alert. Gait normal.  5 out of 5 strength bilateral quads, hamstrings, plantar flexion, and dorsiflexion, sensation to light touch intact bilateral lower extremities  Skin: Skin is warm and dry. She is not diaphoretic.  There is scattered blotchiness to her skin in her bilateral upper extremities  Psychiatric: Mood and affect normal.     Assessment/Plan: Please see individual problem list.  Chronic fatigue Patient with chronic fatigue and pain. This has continued to be an issue. Notes she has an autoimmune disorder and was diagnosed with fibromyalgia. I discussed that those things could be causing her fatigue and pain. We will obtain repeat lab work. She was advised to follow-up with her rheumatologist. Could consider pain management referral or referral to PM&R.  Rash Patient with blotchy rash on bilateral arms. I am unsure of the cause. Patient is worried about a clotting issue, though this would be an odd way to present with no bruises or bleeding. We'll check  a CBC to determine if platelets are low. She will follow-up with her dermatologist for further evaluation.  Lower abdominal pain Patient notes persistent discomfort in her lower abdomen over the last month or so. Has been treated for UTIs twice. Her most recent urine culture was negative. She continues to have some pressure  sensation and urinary frequency and urgency. We will resend a urine culture. Given persistence of her discomfort we will obtain a CT abdomen and pelvis to evaluate further and additionally evaluate her liver given the liver lesion that was seen on CT chest previously. Given return precautions.   Orders Placed This Encounter  Procedures  . Urine Culture  . CT Abdomen Pelvis W Contrast    Standing Status:   Future    Standing Expiration Date:   11/07/2017    Order Specific Question:   If indicated for the ordered procedure, I authorize the administration of contrast media per Radiology protocol    Answer:   Yes    Order Specific Question:   Reason for Exam (SYMPTOM  OR DIAGNOSIS REQUIRED)    Answer:   lower abdominal and pelvic pressure sensation for about a month    Order Specific Question:   Is patient pregnant?    Answer:   No    Order Specific Question:   Preferred imaging location?    Answer:   [facility]  . Comp Met (CMET)  . TSH  . CBC  . Sed Rate (ESR)  . B12  . Urine Microscopic Only  . POCT Urinalysis Dipstick    [name], MD [place]

## 2016-08-07 NOTE — Progress Notes (Signed)
Pre visit review using our clinic review tool, if applicable. No additional management support is needed unless otherwise documented below in the visit note. 

## 2016-08-08 ENCOUNTER — Ambulatory Visit: Payer: PPO | Admitting: Family Medicine

## 2016-08-08 DIAGNOSIS — R21 Rash and other nonspecific skin eruption: Secondary | ICD-10-CM | POA: Insufficient documentation

## 2016-08-08 DIAGNOSIS — R1030 Lower abdominal pain, unspecified: Secondary | ICD-10-CM | POA: Insufficient documentation

## 2016-08-08 NOTE — Assessment & Plan Note (Signed)
Patient notes persistent discomfort in her lower abdomen over the last month or so. Has been treated for UTIs twice. Her most recent urine culture was negative. She continues to have some pressure sensation and urinary frequency and urgency. We will resend a urine culture. Given persistence of her discomfort we will obtain a CT abdomen and pelvis to evaluate further and additionally evaluate her liver given the liver lesion that was seen on CT chest previously. Given return precautions.

## 2016-08-08 NOTE — Assessment & Plan Note (Addendum)
Patient with chronic fatigue and pain. This has continued to be an issue. Notes she has an autoimmune disorder and was diagnosed with fibromyalgia. I discussed that those things could be causing her fatigue and pain. We will obtain repeat lab work. She was advised to follow-up with her rheumatologist. Could consider pain management referral or referral to PM&R.

## 2016-08-08 NOTE — Assessment & Plan Note (Signed)
Patient with blotchy rash on bilateral arms. I am unsure of the cause. Patient is worried about a clotting issue, though this would be an odd way to present with no bruises or bleeding. We'll check a CBC to determine if platelets are low. She will follow-up with her dermatologist for further evaluation.

## 2016-08-09 LAB — URINE CULTURE: Organism ID, Bacteria: NO GROWTH

## 2016-08-12 ENCOUNTER — Encounter: Payer: Self-pay | Admitting: Family Medicine

## 2016-08-14 ENCOUNTER — Encounter: Payer: Self-pay | Admitting: Internal Medicine

## 2016-08-14 ENCOUNTER — Telehealth: Payer: Self-pay

## 2016-08-14 ENCOUNTER — Ambulatory Visit
Admission: RE | Admit: 2016-08-14 | Discharge: 2016-08-14 | Disposition: A | Discharge status: Home / Self Care | Payer: PPO | Source: Ambulatory Visit | Attending: Family Medicine | Admitting: Family Medicine

## 2016-08-14 DIAGNOSIS — D259 Leiomyoma of uterus, unspecified: Secondary | ICD-10-CM | POA: Diagnosis not present

## 2016-08-14 DIAGNOSIS — K7689 Other specified diseases of liver: Secondary | ICD-10-CM | POA: Diagnosis not present

## 2016-08-14 DIAGNOSIS — R1030 Lower abdominal pain, unspecified: Secondary | ICD-10-CM | POA: Insufficient documentation

## 2016-08-14 DIAGNOSIS — R7989 Other specified abnormal findings of blood chemistry: Secondary | ICD-10-CM

## 2016-08-14 DIAGNOSIS — K5730 Diverticulosis of large intestine without perforation or abscess without bleeding: Secondary | ICD-10-CM | POA: Diagnosis not present

## 2016-08-14 DIAGNOSIS — R945 Abnormal results of liver function studies: Principal | ICD-10-CM

## 2016-08-14 DIAGNOSIS — K760 Fatty (change of) liver, not elsewhere classified: Secondary | ICD-10-CM | POA: Insufficient documentation

## 2016-08-14 MED ORDER — IOPAMIDOL (ISOVUE-300) INJECTION 61%
100.0000 mL | Freq: Once | INTRAVENOUS | Status: AC | PRN
  Administered 2016-08-14: 100 mL via INTRAVENOUS

## 2016-08-14 NOTE — Telephone Encounter (Signed)
Order placed

## 2016-08-14 NOTE — Telephone Encounter (Signed)
Pt coming for repeat labs 08/15/16. Please place future orders. Thank you.

## 2016-08-15 ENCOUNTER — Telehealth: Payer: Self-pay | Admitting: Family Medicine

## 2016-08-15 ENCOUNTER — Other Ambulatory Visit: Payer: PPO

## 2016-08-15 ENCOUNTER — Other Ambulatory Visit (INDEPENDENT_AMBULATORY_CARE_PROVIDER_SITE_OTHER): Payer: PPO

## 2016-08-15 DIAGNOSIS — E871 Hypo-osmolality and hyponatremia: Secondary | ICD-10-CM

## 2016-08-15 DIAGNOSIS — R945 Abnormal results of liver function studies: Principal | ICD-10-CM

## 2016-08-15 DIAGNOSIS — D259 Leiomyoma of uterus, unspecified: Secondary | ICD-10-CM

## 2016-08-15 DIAGNOSIS — R7989 Other specified abnormal findings of blood chemistry: Secondary | ICD-10-CM | POA: Diagnosis not present

## 2016-08-15 LAB — COMPREHENSIVE METABOLIC PANEL
ALT: 56 U/L — ABNORMAL HIGH (ref 0–35)
AST: 38 U/L — ABNORMAL HIGH (ref 0–37)
Albumin: 4.1 g/dL (ref 3.5–5.2)
Alkaline Phosphatase: 127 U/L — ABNORMAL HIGH (ref 39–117)
BUN: 14 mg/dL (ref 6–23)
CO2: 29 mEq/L (ref 19–32)
Calcium: 9.4 mg/dL (ref 8.4–10.5)
Chloride: 94 mEq/L — ABNORMAL LOW (ref 96–112)
Creatinine, Ser: 0.82 mg/dL (ref 0.40–1.20)
GFR: 75.95 mL/min (ref >60.00)
Glucose, Bld: 165 mg/dL — ABNORMAL HIGH (ref 70–99)
Potassium: 3.9 mEq/L (ref 3.5–5.1)
Sodium: 129 mEq/L — ABNORMAL LOW (ref 135–145)
Total Bilirubin: 0.3 mg/dL (ref 0.2–1.2)
Total Protein: 7.7 g/dL (ref 6.0–8.3)

## 2016-08-15 NOTE — Telephone Encounter (Signed)
I attempted to contact the patient regarding her lab results from today. There was no answer. I left a voicemail asking her to call back to the office. We will CMA try to contact the patient tomorrow. Sodium is low. LFTs are stable essentially. Please find out if she is taking in good fluids and food. Please see if she has any nausea or vomiting or diarrhea as well. Please see if she is having any new symptoms since last time we saw each other.

## 2016-08-16 ENCOUNTER — Other Ambulatory Visit (INDEPENDENT_AMBULATORY_CARE_PROVIDER_SITE_OTHER): Payer: PPO

## 2016-08-16 DIAGNOSIS — E871 Hypo-osmolality and hyponatremia: Secondary | ICD-10-CM | POA: Diagnosis not present

## 2016-08-16 DIAGNOSIS — G4733 Obstructive sleep apnea (adult) (pediatric): Secondary | ICD-10-CM | POA: Diagnosis not present

## 2016-08-16 LAB — COMPREHENSIVE METABOLIC PANEL
ALT: 50 U/L — ABNORMAL HIGH (ref 0–35)
AST: 31 U/L (ref 0–37)
Albumin: 4.3 g/dL (ref 3.5–5.2)
Alkaline Phosphatase: 127 U/L — ABNORMAL HIGH (ref 39–117)
BUN: 11 mg/dL (ref 6–23)
CO2: 26 mEq/L (ref 19–32)
Calcium: 9.4 mg/dL (ref 8.4–10.5)
Chloride: 102 mEq/L (ref 96–112)
Creatinine, Ser: 0.78 mg/dL (ref 0.40–1.20)
GFR: 80.46 mL/min (ref >60.00)
Glucose, Bld: 203 mg/dL — ABNORMAL HIGH (ref 70–99)
Potassium: 4 mEq/L (ref 3.5–5.1)
Sodium: 136 mEq/L (ref 135–145)
Total Bilirubin: 0.4 mg/dL (ref 0.2–1.2)
Total Protein: 8 g/dL (ref 6.0–8.3)

## 2016-08-16 NOTE — Telephone Encounter (Signed)
Spoke with patient and she stated that she drinks at least 5-6 bottles of water a day. She has had some nausea , but no other symptoms including no confusion. Notified her of the Ct report as well. She stated that in 2009 she had a fibroid removed and she is having the same pain now that she had back then. I advised that Dr. Caryl Bis would probably put in referral for GYN then. She is coming in today to have repeat labs. Please place order.

## 2016-08-16 NOTE — Telephone Encounter (Signed)
Order placed for lab work. I placed a referral to gynecology as well. Thanks.

## 2016-08-19 ENCOUNTER — Encounter: Payer: Self-pay | Admitting: Family Medicine

## 2016-08-23 DIAGNOSIS — G4733 Obstructive sleep apnea (adult) (pediatric): Secondary | ICD-10-CM | POA: Diagnosis not present

## 2016-08-26 ENCOUNTER — Other Ambulatory Visit: Payer: Self-pay | Admitting: Family Medicine

## 2016-08-26 DIAGNOSIS — K7689 Other specified diseases of liver: Secondary | ICD-10-CM

## 2016-08-29 ENCOUNTER — Encounter: Payer: Self-pay | Admitting: Obstetrics and Gynecology

## 2016-08-29 ENCOUNTER — Other Ambulatory Visit: Payer: Self-pay | Admitting: Family Medicine

## 2016-08-29 ENCOUNTER — Telehealth: Payer: Self-pay | Admitting: Family Medicine

## 2016-08-29 ENCOUNTER — Encounter: Payer: Self-pay | Admitting: Family Medicine

## 2016-08-29 DIAGNOSIS — K760 Fatty (change of) liver, not elsewhere classified: Secondary | ICD-10-CM

## 2016-08-29 NOTE — Telephone Encounter (Signed)
Completed.

## 2016-08-29 NOTE — Telephone Encounter (Signed)
Can you sign off on this?

## 2016-08-29 NOTE — Telephone Encounter (Signed)
Amber from [place] called and put in the correct order for pt's u/s. Just needs to be signed off. Thank you!  Call Amber [phone]

## 2016-09-02 NOTE — Telephone Encounter (Signed)
Spoke with MAD on 12/1. He advised I contact pt and get her seen. Pt states she was having recurrent uti and pelvic pain and PCP wanted to do an exam. She wanted gyn to do her exam. She called and scheduled and AE on for 12/13. Pt understands that since she has not been seen in 3 years she is considered a NP. Was given 1st available.  D/t the pelvic pain pcp ordered CT. Ct revealed a fibroid. She contacted office the week of 11/20 to be put on a cancellation list and speak to me. Only to be informed that I was not here and her results would be seen by mad because we were all connected.  Pt then sent a my chart message on 11/30 because she states she was not getting anywhere with the front desk. She felt like they did not care.  I apologized to pt. I made it clear that is not how we do business at [facility]. Informed her if she needed anything else to contact me directly. Offered her an appt for 12/5 at 1:30. She accepted.  Also informed pt to make MAD aware of the situation. I mad DC aware and front desk. Pt was not put on cancellation list, as front desk states they do not like it and prefer to write it down. Informed front desk moving forward,  if a pt wants to speak to me to send me a message.

## 2016-09-03 ENCOUNTER — Ambulatory Visit
Admission: RE | Admit: 2016-09-03 | Discharge: 2016-09-03 | Disposition: A | Discharge status: Home / Self Care | Payer: PPO | Source: Ambulatory Visit | Attending: Family Medicine | Admitting: Family Medicine

## 2016-09-03 ENCOUNTER — Ambulatory Visit (INDEPENDENT_AMBULATORY_CARE_PROVIDER_SITE_OTHER): Payer: Self-pay | Admitting: Obstetrics and Gynecology

## 2016-09-03 ENCOUNTER — Encounter: Payer: Self-pay | Admitting: Obstetrics and Gynecology

## 2016-09-03 VITALS — HR 83 | Ht 64.0 in | Wt 184.4 lb

## 2016-09-03 DIAGNOSIS — Z170 Estrogen receptor positive status [ER+]: Secondary | ICD-10-CM

## 2016-09-03 DIAGNOSIS — D259 Leiomyoma of uterus, unspecified: Secondary | ICD-10-CM | POA: Diagnosis not present

## 2016-09-03 DIAGNOSIS — C50919 Malignant neoplasm of unspecified site of unspecified female breast: Secondary | ICD-10-CM | POA: Diagnosis not present

## 2016-09-03 DIAGNOSIS — R102 Pelvic and perineal pain unspecified side: Secondary | ICD-10-CM

## 2016-09-03 DIAGNOSIS — N952 Postmenopausal atrophic vaginitis: Secondary | ICD-10-CM

## 2016-09-03 DIAGNOSIS — Z780 Asymptomatic menopausal state: Secondary | ICD-10-CM

## 2016-09-03 DIAGNOSIS — K7689 Other specified diseases of liver: Secondary | ICD-10-CM | POA: Diagnosis not present

## 2016-09-03 NOTE — Patient Instructions (Signed)
1. Pelvic ultrasound is scheduled 2. Return in 6 months for annual exam

## 2016-09-03 NOTE — Progress Notes (Signed)
GYN ENCOUNTER NOTE  Subjective:       Kristina Mccoy is a 60 y.o. G0P0000 female is here for gynecologic evaluation of the following issues:  1. Uterine fibroid  60 year old menopausal white female, para 0, not on hormone replacement therapy, with history Intraductal breast cancer, presents for evaluation of uterine fibroid identified on recent CT scan. CT scan 08/14/2016 demonstrated a 15 mm hyperdense lesion in the right fundus consistent with uterine fibroid. Patient had a previous myomectomy because of a large fibroid which was symptomatic in the past; the procedure was performed in [place] by Dr.Eure. Patient is not experiencing any abnormal uterine bleeding. She does have right-sided pelvic pain over the past several months without exacerbating or alleviating factors. Over the same time she has had at least 3 UTIs. The patient is on tamoxifen, year 3, for breast cancer. She is not experiencing any vaginal discharge.  History of breast cancer, stage II, right sided, diagnosed 02/16/2013; estrogen/progesterone positive; HER-2 negative; 4 of 7 lymph nodes were positive; status post bilateral mastectomy with lymph node dissection; status post Cytoxan, Adriamycin, paclitaxel chemotherapy; status post radiation therapy  .     Gynecologic History No LMP recorded. Patient is postmenopausal. Contraception: post menopausal status Last Pap: October 2014 negative  Obstetric History OB History  Gravida Para Term Preterm AB Living  0 0 0 0 0 0  SAB TAB Ectopic Multiple Live Births  0 0 0 0 0        Past Medical History:  Diagnosis Date  . Breast cancer ([place]) 5/14   left breast  . Breast cancer ([place]) 02/2013   Dr Laurance Flatten ([place])  . Diabetes mellitus   . Diverticulosis   . Fibromyalgia   . Heart murmur   . Hyperlipidemia   . Lymphedema of arm    right  . Otitis media, chronic   . Peptic ulcer   . Sjogren's disease [facility])     Past Surgical History:  Procedure Laterality  Date  . BREAST SURGERY     bilateral  . MASTECTOMY Bilateral 03/23/13    Current Outpatient Prescriptions on File Prior to Visit  Medication Sig Dispense Refill  . albuterol (PROVENTIL HFA;VENTOLIN HFA) 108 (90 Base) MCG/ACT inhaler Inhale 2 puffs into the lungs every 6 (six) hours as needed for wheezing or shortness of breath. 1 Inhaler 0  . ALPRAZolam (XANAX) 0.5 MG tablet TAKE ONE TABLET BY MOUTH FOUR TIMES A DAY AS NEEDED FOR ANXIETY 90 tablet 3  . cyclobenzaprine (FLEXERIL) 10 MG tablet Take 10 mg by mouth 3 (three) times daily as needed for muscle spasms.    Marland Kitchen dicyclomine (BENTYL) 10 MG capsule Take 1 capsule (10 mg total) by mouth 3 (three) times daily as needed for spasms. 60 capsule 1  . Emollient (CERAVE) CREA Apply topically.    Marland Kitchen FREESTYLE LITE test strip CHECK 6 TIMES DAILY 100 each 3  . glipiZIDE (GLUCOTROL) 10 MG tablet Take 1 tablet (10 mg total) by mouth 2 (two) times daily before a meal. 60 tablet 3  . glucose blood (ONE TOUCH ULTRA TEST) test strip CHECK SIX TIMES DAILY 100 each 11  . hyoscyamine (LEVSIN, ANASPAZ) 0.125 MG tablet TAKE ONE TABLET BY MOUTH EVERY 4 HOURS AS NEEDED 90 tablet 3  . insulin NPH Human (HUMULIN N,NOVOLIN N) 100 UNIT/ML injection Inject 0.05 mLs (5 Units total) into the skin 2 (two) times daily before a meal. 10 mL 11  . ONETOUCH DELICA LANCETS 16X MISC TWICE DAILY 100  each 3  . pantoprazole (PROTONIX) 40 MG tablet Take 1 tablet (40 mg total) by mouth 2 (two) times daily. 60 tablet 3  . prochlorperazine (COMPAZINE) 10 MG tablet Take 10 mg by mouth every 6 (six) hours as needed.    . tamoxifen (NOLVADEX) 20 MG tablet Take 20 mg by mouth daily.    Marland Kitchen tiZANidine (ZANAFLEX) 2 MG tablet Take 1 tablet (2 mg total) by mouth every 8 (eight) hours as needed (headaches). 60 tablet 0   No current facility-administered medications on file prior to visit.     Allergies  Allergen Reactions  . Gabapentin Itching and Swelling  . Oxycodone Itching and Rash  .  Carbamazepine Nausea And Vomiting and Other (See Comments)    Abdominal pain   . Insulin Glargine Other (See Comments)    BLOATING, MUSCLE/JOINT PAIN  . Methylprednisolone     Reddened face, puffy face   . Morphine Other (See Comments)  . Pregabalin Swelling    Swelling in arms and legs  . Buprenorphine Rash and Swelling  . Canagliflozin Other (See Comments) and Rash    YEAST INFECTIONS    Social History   Social History  . Marital status: Single    Spouse name: N/A  . Number of children: N/A  . Years of education: N/A   Occupational History  . disabled    Social History Main Topics  . Smoking status: Former Smoker    Packs/day: 0.50    Types: Cigarettes    Quit date: 02/28/2013  . Smokeless tobacco: Never Used  . Alcohol use No  . Drug use: No  . Sexual activity: No   Other Topics Concern  . Not on file   Social History Narrative  . No narrative on file    Family History  Problem Relation Age of Onset  . Diabetes Mother   . Heart disease Mother   . Hyperlipidemia Mother   . Heart failure Mother   . Diabetes Other   . Heart disease Brother   . Hyperlipidemia Brother   . Colon polyps Brother   . Stroke Maternal Grandmother   . Breast cancer Maternal Aunt   . Colon cancer Paternal Grandmother   . Ovarian cancer Neg Hx     The following portions of the patient's history were reviewed and updated as appropriate: allergies, current medications, past family history, past medical history, past social history, past surgical history and problem list.  Review of Systems Review of Systems - Per history of present illness  Objective:   BP (!) 145/82   Pulse 83   Ht _0  (1.626 m)   Wt 184 lb 6.4 oz (83.6 kg)   BMI 31.65 kg/m  CONSTITUTIONAL: Well-developed, well-nourished female in no acute distress.  HENT:  Normocephalic, atraumatic.  NECK: Not examined SKIN: Skin is warm and dry. No rash noted. Not diaphoretic. No erythema. No pallor. [place]: Alert  and oriented to person, place, and time. PSYCHIATRIC: Normal mood and affect. Normal behavior. Normal judgment and thought content. CARDIOVASCULAR:Not Examined RESPIRATORY: Not Examined BREASTS: Not Examined ABDOMEN: Soft, non distended; Non tender.  No Organomegaly. PELVIC:  External Genitalia: Normal  BUS: Normal  Vagina: Moderate to severe atrophy with decreased rugate and petechiae present (Peterson speculum)  Cervix: Normal; no lesions; no cervical motion tenderness  Uterus: Normal size, shape,consistency, mobile, midplane, difficult to palpate due to body habitus  Adnexa: Normal; nonpalpable and nontender  RV: Normal external exam; normal sphincter tone; no rectal masses  Bladder: Nontender MUSCULOSKELETAL: Normal range of motion. No tenderness.  No cyanosis, clubbing, or edema.     Assessment:   1. Uterine leiomyoma, unspecified location, 15 mm noted on CT scan, asymptomatic - US Pelvis Complete; Future - US Transvaginal Non-OB; Future  2. Pelvic pain, right lower quadrant, without exacerbating or alleviating factors  3. Malignant neoplasm of breast in female, estrogen receptor positive, unspecified laterality, unspecified site of breast ([place])  4. Vaginal atrophy, severe  5. Menopause     Plan:   1. Pelvic ultrasound 2. Return in 6 months for annual exam including Pap smear 3. Uterine fibroid significance was discussed; surveillance outlined. Reassurance is given.  Brayton Mars, MD  Note: This dictation was prepared with Dragon dictation along with smaller phrase technology. Any transcriptional errors that result from this process are unintentional.

## 2016-09-04 ENCOUNTER — Encounter: Payer: Self-pay | Admitting: Family Medicine

## 2016-09-10 ENCOUNTER — Other Ambulatory Visit: Payer: Self-pay

## 2016-09-11 ENCOUNTER — Encounter: Payer: Self-pay | Admitting: Obstetrics and Gynecology

## 2016-09-15 DIAGNOSIS — G4733 Obstructive sleep apnea (adult) (pediatric): Secondary | ICD-10-CM | POA: Diagnosis not present

## 2016-10-07 DIAGNOSIS — E1165 Type 2 diabetes mellitus with hyperglycemia: Secondary | ICD-10-CM | POA: Diagnosis not present

## 2016-10-09 ENCOUNTER — Encounter: Payer: Self-pay | Admitting: Family Medicine

## 2016-10-09 LAB — HM DIABETES EYE EXAM

## 2016-10-15 ENCOUNTER — Encounter: Payer: Self-pay | Admitting: Family Medicine

## 2016-10-15 ENCOUNTER — Ambulatory Visit (INDEPENDENT_AMBULATORY_CARE_PROVIDER_SITE_OTHER): Payer: PPO | Admitting: Family Medicine

## 2016-10-15 VITALS — BP 118/64 | HR 81 | Temp 97.9°F | Wt 184.8 lb

## 2016-10-15 DIAGNOSIS — E1142 Type 2 diabetes mellitus with diabetic polyneuropathy: Secondary | ICD-10-CM

## 2016-10-15 DIAGNOSIS — R3915 Urgency of urination: Secondary | ICD-10-CM | POA: Diagnosis not present

## 2016-10-15 DIAGNOSIS — R938 Abnormal findings on diagnostic imaging of other specified body structures: Secondary | ICD-10-CM

## 2016-10-15 DIAGNOSIS — R7989 Other specified abnormal findings of blood chemistry: Secondary | ICD-10-CM | POA: Diagnosis not present

## 2016-10-15 DIAGNOSIS — E119 Type 2 diabetes mellitus without complications: Secondary | ICD-10-CM

## 2016-10-15 DIAGNOSIS — IMO0002 Reserved for concepts with insufficient information to code with codable children: Secondary | ICD-10-CM

## 2016-10-15 DIAGNOSIS — R059 Cough, unspecified: Secondary | ICD-10-CM

## 2016-10-15 DIAGNOSIS — R945 Abnormal results of liver function studies: Secondary | ICD-10-CM

## 2016-10-15 DIAGNOSIS — R69 Illness, unspecified: Secondary | ICD-10-CM

## 2016-10-15 DIAGNOSIS — R0989 Other specified symptoms and signs involving the circulatory and respiratory systems: Secondary | ICD-10-CM | POA: Insufficient documentation

## 2016-10-15 DIAGNOSIS — E1165 Type 2 diabetes mellitus with hyperglycemia: Secondary | ICD-10-CM

## 2016-10-15 DIAGNOSIS — Z794 Long term (current) use of insulin: Secondary | ICD-10-CM

## 2016-10-15 DIAGNOSIS — R8290 Unspecified abnormal findings in urine: Secondary | ICD-10-CM | POA: Insufficient documentation

## 2016-10-15 DIAGNOSIS — K743 Primary biliary cirrhosis: Secondary | ICD-10-CM | POA: Insufficient documentation

## 2016-10-15 DIAGNOSIS — R05 Cough: Secondary | ICD-10-CM

## 2016-10-15 DIAGNOSIS — R6889 Other general symptoms and signs: Secondary | ICD-10-CM

## 2016-10-15 DIAGNOSIS — J111 Influenza due to unidentified influenza virus with other respiratory manifestations: Secondary | ICD-10-CM | POA: Insufficient documentation

## 2016-10-15 DIAGNOSIS — R9389 Abnormal findings on diagnostic imaging of other specified body structures: Secondary | ICD-10-CM

## 2016-10-15 LAB — URINALYSIS, MICROSCOPIC ONLY: RBC / HPF: NONE SEEN (ref 0–?)

## 2016-10-15 LAB — COMPREHENSIVE METABOLIC PANEL
ALT: 36 U/L — ABNORMAL HIGH (ref 0–35)
AST: 29 U/L (ref 0–37)
Albumin: 4.2 g/dL (ref 3.5–5.2)
Alkaline Phosphatase: 101 U/L (ref 39–117)
BUN: 14 mg/dL (ref 6–23)
CO2: 29 mEq/L (ref 19–32)
Calcium: 9.6 mg/dL (ref 8.4–10.5)
Chloride: 99 mEq/L (ref 96–112)
Creatinine, Ser: 0.94 mg/dL (ref 0.40–1.20)
GFR: 64.84 mL/min (ref >60.00)
Glucose, Bld: 147 mg/dL — ABNORMAL HIGH (ref 70–99)
Potassium: 4.3 mEq/L (ref 3.5–5.1)
Sodium: 134 mEq/L — ABNORMAL LOW (ref 135–145)
Total Bilirubin: 0.3 mg/dL (ref 0.2–1.2)
Total Protein: 7.9 g/dL (ref 6.0–8.3)

## 2016-10-15 LAB — POCT URINALYSIS DIPSTICK
Bilirubin, UA: NEGATIVE
Blood, UA: NEGATIVE
Glucose, UA: NEGATIVE
Ketones, UA: NEGATIVE
Nitrite, UA: NEGATIVE
Protein, UA: NEGATIVE
Spec Grav, UA: 1.015
Urobilinogen, UA: 0.2
pH, UA: 6

## 2016-10-15 LAB — POCT INFLUENZA A/B
Influenza A, POC: NEGATIVE
Influenza B, POC: NEGATIVE

## 2016-10-15 MED ORDER — OSELTAMIVIR PHOSPHATE 75 MG PO CAPS: 75.0000 mg | ORAL_CAPSULE | Freq: Two times a day (BID) | ORAL | 0 refills | Status: DC

## 2016-10-15 NOTE — Patient Instructions (Signed)
Nice to see you. We are going to treat you for the flu with Tamiflu. We will get you back to see ear nose and throat physician and also get you to see GI in town. Somebody will contact you regarding lung cancer screening. We will send her urine for culture to see if there is infection. We will contact you with the results. If you develop trouble breathing, inability to swallow, fevers, or any new or changing symptoms please seek medical attention.

## 2016-10-15 NOTE — Assessment & Plan Note (Signed)
Enlarged right paratracheal lymph node on prior CT scan. Did improve on the follow-up CT scan. No follow-up since then. Discussed that given improvement this was likely reactive in nature. Given smoking history we will see if we can get her set up for screening CTs for lung cancer screening. A message will be forwarded to Burgess Estelle to contact the patient.

## 2016-10-15 NOTE — Assessment & Plan Note (Addendum)
Continue current medications. Monitor for hypoglycemia. If it becomes more frequent she will contact us or her endocrinologist. Check A1c. Follow up with endocrinology.

## 2016-10-15 NOTE — Assessment & Plan Note (Signed)
Patient reports abnormal urine odor. UA with leukocytes though no other abnormalities. We'll send urine for culture and microscopy prior to treating.

## 2016-10-15 NOTE — Assessment & Plan Note (Signed)
Patient's upper respiratory symptoms consistent with influenza-like illness. In setting of positive exposure at home we will go ahead and treat with Tamiflu given her history of autoimmune disorder and diabetes. Tamiflu sent to pharmacy. Given return precautions.

## 2016-10-15 NOTE — Progress Notes (Signed)
[name], MD Phone: [phone]  Kristina Mccoy is a 61 y.o. female who presents today for follow-up.  Patient has 2-3 days of cough, congestion, postnasal drip, runny nose, and chills. Feels lethargic. Breathing is stable. One of her family members was recently diagnosed with type A flu.  Diabetes: Checks her sugars and they are up and down. Currently taking NPH and glipizide. No polyuria or polydipsia. Does have some hypoglycemia with dips down into the 60s. She takes a glucose tablet and this improves. She wants to discuss this further with her endocrinologist at their follow-up in one to 2 weeks.  Patient notes she feels a lump in her throat when she swallows. This has been a chronic issue and was previously evaluated by ENT though the patient notes they did not do direct visualization and advised her that it was related to reflux. Protonix has helped some. No pain or trouble swallowing. She is a former smoker. No alcohol use.  Patient notes her urine has a strange smell to it. Some urgency that is chronic. No dysuria or fever.  Additionally had discussion with the patient regarding a prior CT scan that revealed a right paratracheal lymph node that was enlarged. She had a repeat CT scan about a month later and this was improving in size. She does have a smoking history of a pack per day for 30 years. She quit 3-4 years ago. Has not had a follow-up scan for the lung cancer screening.  I also discussed her liver cyst and elevated liver enzymes. She has been referred to GI at [facility] though they don't have any appointments until May. She occasionally notes right upper quadrant discomfort that lasts briefly and goes away on its own. No other associated symptoms with this.  PMH: Former smoker   ROS see history of present illness  Objective  Physical Exam Vitals:   10/15/16 1329  BP: 118/64  Pulse: 81  Temp: 97.9 F (36.6 C)    BP Readings from Last 3 Encounters:  10/15/16  118/64  08/07/16 122/66  08/01/16 120/72   Wt Readings from Last 3 Encounters:  10/15/16 184 lb 12.8 oz (83.8 kg)  09/03/16 184 lb 6.4 oz (83.6 kg)  08/07/16 187 lb 6.4 oz (85 kg)    Physical Exam  Constitutional: No distress.  HENT:  Head: Normocephalic and atraumatic.  Mouth/Throat: Oropharynx is clear and moist. No oropharyngeal exudate.  Eyes: Conjunctivae are normal. Pupils are equal, round, and reactive to light.  Cardiovascular: Normal rate, regular rhythm and normal heart sounds.   Pulmonary/Chest: Effort normal and breath sounds normal.  Abdominal: Soft. Bowel sounds are normal. She exhibits no distension. There is no tenderness. There is no rebound and no guarding.  Neurological: She is alert. Gait normal.  Skin: Skin is warm. She is not diaphoretic.     Assessment/Plan: Please see individual problem list.  Influenza-like illness Patient's upper respiratory symptoms consistent with influenza-like illness. In setting of positive exposure at home we will go ahead and treat with Tamiflu given her history of autoimmune disorder and diabetes. Tamiflu sent to pharmacy. Given return precautions.  Diabetes type 2, uncontrolled ([facility]) Continue current medications. Monitor for hypoglycemia. If it becomes more frequent she will contact us or her endocrinologist. Check A1c. Follow up with endocrinology.  Throat fullness Patient with sensation of lump in her throat when she swallows. No pain or trouble swallowing. Given history of smoking we will refer back to ENT for direct visualization. She'll continue her Protonix  as this is also possibly related to reflux.  Abnormal chest CT Enlarged right paratracheal lymph node on prior CT scan. Did improve on the follow-up CT scan. No follow-up since then. Discussed that given improvement this was likely reactive in nature. Given smoking history we will see if we can get her set up for screening CTs for lung cancer screening. A message will be  forwarded to Burgess Estelle to contact the patient.  Abnormal urine odor Patient reports abnormal urine odor. UA with leukocytes though no other abnormalities. We'll send urine for culture and microscopy prior to treating.  Elevated LFTs Previously elevated. Prior ultrasound with liver cyst though no other significant abnormalities. We will refer to GI locally for further evaluation.   Orders Placed This Encounter  Procedures  . Urine Culture  . Comp Met (CMET)  . Urine Microscopic Only  . Ambulatory referral to Gastroenterology    Referral Priority:   Routine    Referral Type:   Consultation    Referral Reason:   Specialty Services Required    Number of Visits Requested:   1  . Ambulatory referral to ENT    Referral Priority:   Routine    Referral Type:   Consultation    Referral Reason:   Specialty Services Required    Requested Specialty:   Otolaryngology    Number of Visits Requested:   1  . POCT Urinalysis Dipstick  . POCT Influenza A/B    Meds ordered this encounter  Medications  . oseltamivir (TAMIFLU) 75 MG capsule    Sig: Take 1 capsule (75 mg total) by mouth 2 (two) times daily.    Dispense:  10 capsule    Refill:  0    [name], MD [place]

## 2016-10-15 NOTE — Assessment & Plan Note (Signed)
Patient with sensation of lump in her throat when she swallows. No pain or trouble swallowing. Given history of smoking we will refer back to ENT for direct visualization. She'll continue her Protonix as this is also possibly related to reflux.

## 2016-10-15 NOTE — Progress Notes (Signed)
Pre visit review using our clinic review tool, if applicable. No additional management support is needed unless otherwise documented below in the visit note. 

## 2016-10-15 NOTE — Assessment & Plan Note (Addendum)
Previously elevated. Prior ultrasound with liver cyst though no other significant abnormalities. We will refer to GI locally for further evaluation.

## 2016-10-16 ENCOUNTER — Telehealth: Payer: Self-pay | Admitting: *Deleted

## 2016-10-16 DIAGNOSIS — G4733 Obstructive sleep apnea (adult) (pediatric): Secondary | ICD-10-CM | POA: Diagnosis not present

## 2016-10-16 DIAGNOSIS — Z87891 Personal history of nicotine dependence: Secondary | ICD-10-CM

## 2016-10-16 NOTE — Telephone Encounter (Signed)
Received referral for initial lung cancer screening scan. Contacted patient and obtained smoking history,(former, quit 2014, 30 pack year) as well as answering questions related to screening process. Patient denies signs of lung cancer such as weight loss or hemoptysis. Patient denies comorbidity that would prevent curative treatment if lung cancer were found. Patient is tentatively scheduled for shared decision making visit and CT scan on 11/05/16 at 1:30pm, pending insurance approval from business office.

## 2016-10-17 LAB — URINE CULTURE

## 2016-10-22 DIAGNOSIS — Z794 Long term (current) use of insulin: Secondary | ICD-10-CM | POA: Diagnosis not present

## 2016-10-22 DIAGNOSIS — E1165 Type 2 diabetes mellitus with hyperglycemia: Secondary | ICD-10-CM | POA: Diagnosis not present

## 2016-10-29 DIAGNOSIS — E1165 Type 2 diabetes mellitus with hyperglycemia: Secondary | ICD-10-CM | POA: Diagnosis not present

## 2016-10-29 DIAGNOSIS — Z780 Asymptomatic menopausal state: Secondary | ICD-10-CM | POA: Diagnosis not present

## 2016-10-29 DIAGNOSIS — Z794 Long term (current) use of insulin: Secondary | ICD-10-CM | POA: Diagnosis not present

## 2016-10-29 DIAGNOSIS — E663 Overweight: Secondary | ICD-10-CM | POA: Diagnosis not present

## 2016-10-29 DIAGNOSIS — L680 Hirsutism: Secondary | ICD-10-CM | POA: Diagnosis not present

## 2016-10-29 DIAGNOSIS — R5383 Other fatigue: Secondary | ICD-10-CM | POA: Diagnosis not present

## 2016-10-31 ENCOUNTER — Other Ambulatory Visit: Payer: Self-pay | Admitting: Family Medicine

## 2016-10-31 MED ORDER — TIZANIDINE HCL 2 MG PO TABS: 2.0000 mg | ORAL_TABLET | Freq: Three times a day (TID) | ORAL | 0 refills | Status: DC | PRN

## 2016-10-31 NOTE — Telephone Encounter (Signed)
Last OV 10/16/15 last filled by Dr.Walker 04/19/16 60 0rf

## 2016-10-31 NOTE — Telephone Encounter (Signed)
Last OV 10/15/16 last filled 07/01/16 90 3rf

## 2016-10-31 NOTE — Telephone Encounter (Signed)
tiZANidine (ZANAFLEX) 2 MG tablet take one tablet by mouth every eight hours as needed (for headaches) qty 60

## 2016-10-31 NOTE — Telephone Encounter (Signed)
faxed

## 2016-11-01 ENCOUNTER — Encounter: Payer: Self-pay | Admitting: Family Medicine

## 2016-11-03 ENCOUNTER — Other Ambulatory Visit: Payer: Self-pay | Admitting: Family Medicine

## 2016-11-03 DIAGNOSIS — G8929 Other chronic pain: Secondary | ICD-10-CM

## 2016-11-03 DIAGNOSIS — M549 Dorsalgia, unspecified: Principal | ICD-10-CM

## 2016-11-05 ENCOUNTER — Other Ambulatory Visit: Payer: Self-pay | Admitting: *Deleted

## 2016-11-05 ENCOUNTER — Ambulatory Visit: Payer: PPO | Admitting: Oncology

## 2016-11-05 DIAGNOSIS — Z87891 Personal history of nicotine dependence: Secondary | ICD-10-CM

## 2016-11-06 ENCOUNTER — Inpatient Hospital Stay: Payer: PPO | Admitting: Oncology

## 2016-11-06 ENCOUNTER — Other Ambulatory Visit: Payer: PPO

## 2016-11-06 ENCOUNTER — Ambulatory Visit: Payer: PPO

## 2016-11-13 ENCOUNTER — Telehealth: Payer: Self-pay | Admitting: *Deleted

## 2016-11-13 NOTE — Telephone Encounter (Signed)
Prior lung screening scan cancelled by patient due to not feeling well. Voicemail left in attempt to reschedule CT scan.

## 2016-11-16 DIAGNOSIS — G4733 Obstructive sleep apnea (adult) (pediatric): Secondary | ICD-10-CM | POA: Diagnosis not present

## 2016-11-19 ENCOUNTER — Telehealth: Payer: Self-pay | Admitting: Family Medicine

## 2016-11-19 NOTE — Telephone Encounter (Signed)
Reason for call: ?bronchitis Symptoms: hurts to cough, chest congestion, sore throat, nose burning, sneezing, head congestion, chills head congestion  Duration 2 days  Medications: Mucinex ,nasonex Urged to go to urgent care for evaluation ,appointment scheduled for tomorrow. Patient stated she would if symptoms got worse.

## 2016-11-19 NOTE — Telephone Encounter (Signed)
I have an available 1:15 appointment at this point. You could see if the patient wanted to come in for that. Thanks.

## 2016-11-19 NOTE — Telephone Encounter (Signed)
Pt called c/o congestion, sore throat, nose burning , hurts to cough. Pt thinks that she has bronchitis. Please advise, thank you!  Call pt @ [phone]

## 2016-11-19 NOTE — Telephone Encounter (Signed)
Didn't see message until time had already passed

## 2016-11-20 ENCOUNTER — Ambulatory Visit (INDEPENDENT_AMBULATORY_CARE_PROVIDER_SITE_OTHER): Payer: PPO | Admitting: Family Medicine

## 2016-11-20 ENCOUNTER — Encounter: Payer: Self-pay | Admitting: Family Medicine

## 2016-11-20 ENCOUNTER — Ambulatory Visit (INDEPENDENT_AMBULATORY_CARE_PROVIDER_SITE_OTHER): Payer: PPO

## 2016-11-20 VITALS — BP 124/80 | HR 110 | Temp 99.3°F | Wt 181.0 lb

## 2016-11-20 DIAGNOSIS — R059 Cough, unspecified: Secondary | ICD-10-CM

## 2016-11-20 DIAGNOSIS — J40 Bronchitis, not specified as acute or chronic: Secondary | ICD-10-CM | POA: Diagnosis not present

## 2016-11-20 DIAGNOSIS — R05 Cough: Secondary | ICD-10-CM | POA: Diagnosis not present

## 2016-11-20 DIAGNOSIS — R0989 Other specified symptoms and signs involving the circulatory and respiratory systems: Secondary | ICD-10-CM | POA: Diagnosis not present

## 2016-11-20 LAB — POCT INFLUENZA A/B
Influenza A, POC: NEGATIVE
Influenza B, POC: NEGATIVE

## 2016-11-20 MED ORDER — DOXYCYCLINE HYCLATE 100 MG PO TABS: 100.0000 mg | ORAL_TABLET | Freq: Two times a day (BID) | ORAL | 0 refills | Status: DC

## 2016-11-20 NOTE — Assessment & Plan Note (Signed)
Patient's symptoms most consistent with bronchitis. Also some possible measure of sinusitis. Rapid flu test was negative. We will treat with doxycycline. We will obtain a chest x-ray. We will have her use her albuterol inhaler on a schedule of every 6 hours for the next 2 days and then as needed. She has responded very poorly to steroids in the past thus we will defer prednisone. Follow-up in 2 days if not improving. She is given return precautions.

## 2016-11-20 NOTE — Progress Notes (Signed)
  Kristina Rumps, MD Phone: [phone]  Talayah Babbit is a 61 y.o. female who presents today for same-day visit.  Patient notes onset of symptoms Monday. Notes mildly productive cough. Notes congestion in her chest and sinuses. Some sore throat. Chills and sweats. Notes it burns when she coughs. She notes her ribs and chest hurt when she coughs. Notes she feels tight in her airways though has no chest pressure. Mildly short of breath. Does note a sick contact. Has tried Tylenol and Mucinex with no benefit.  PMH: Former smoker   ROS see history of present illness  Objective  Physical Exam Vitals:   11/20/16 0937  BP: 124/80  Pulse: (!) 110  Temp: 99.3 F (37.4 C)    BP Readings from Last 3 Encounters:  11/20/16 124/80  10/15/16 118/64  08/07/16 122/66   Wt Readings from Last 3 Encounters:  11/20/16 181 lb (82.1 kg)  10/15/16 184 lb 12.8 oz (83.8 kg)  09/03/16 184 lb 6.4 oz (83.6 kg)    Physical Exam  Constitutional: No distress.  HENT:  Head: Normocephalic and atraumatic.  Mouth/Throat: Oropharynx is clear and moist. No oropharyngeal exudate.  Normal TMs bilaterally  Eyes: Conjunctivae are normal. Pupils are equal, round, and reactive to light.  Neck: Neck supple.  Cardiovascular: Regular rhythm and normal heart sounds.  Tachycardia present.   Pulmonary/Chest: Effort normal and breath sounds normal.  Lymphadenopathy:    She has no cervical adenopathy.  Neurological: She is alert. Gait normal.  Skin: Skin is warm and dry. She is not diaphoretic.     Assessment/Plan: Please see individual problem list.  Bronchitis Patient's symptoms most consistent with bronchitis. Also some possible measure of sinusitis. Rapid flu test was negative. We will treat with doxycycline. We will obtain a chest x-ray. We will have her use her albuterol inhaler on a schedule of every 6 hours for the next 2 days and then as needed. She has responded very poorly to steroids in the past  thus we will defer prednisone. Follow-up in 2 days if not improving. She is given return precautions.   Orders Placed This Encounter  Procedures  . DG Chest 2 View    Standing Status:   Future    Number of Occurrences:   1    Standing Expiration Date:   01/18/2018    Order Specific Question:   Reason for Exam (SYMPTOM  OR DIAGNOSIS REQUIRED)    Answer:   cough, chest congestion, chills, mild dyspnea    Order Specific Question:   Is patient pregnant?    Answer:   No    Order Specific Question:   Preferred imaging location?    Answer:   [facility]  . POCT Influenza A/B    Meds ordered this encounter  Medications  . doxycycline (VIBRA-TABS) 100 MG tablet    Sig: Take 1 tablet (100 mg total) by mouth 2 (two) times daily.    Dispense:  14 tablet    Refill:  0   Kristina Rumps, MD [place]

## 2016-11-20 NOTE — Patient Instructions (Signed)
Nice to see you. You likely have bronchitis and possibly a sinus infection. We will check a chest x-ray to rule out pneumonia. We'll treat with doxycycline. You should take a probiotic or eat some yogurt while taking this. You should use your albuterol inhaler every 6 hours for the next 2 days and then as needed. If you develop shortness of breath, cough productive of blood, persistent fevers, or any new or changing symptoms please seek medical attention immediately.

## 2016-11-20 NOTE — Progress Notes (Signed)
Pre visit review using our clinic review tool, if applicable. No additional management support is needed unless otherwise documented below in the visit note. 

## 2016-11-25 DIAGNOSIS — G4733 Obstructive sleep apnea (adult) (pediatric): Secondary | ICD-10-CM | POA: Diagnosis not present

## 2016-11-28 ENCOUNTER — Other Ambulatory Visit: Payer: Self-pay

## 2016-11-28 ENCOUNTER — Other Ambulatory Visit: Payer: Self-pay | Admitting: Family Medicine

## 2016-11-28 NOTE — Telephone Encounter (Signed)
Last OV 11/20/16 last filled 10/31/16 60 0rf

## 2016-11-28 NOTE — Telephone Encounter (Signed)
I just refilled a prescription for Zanaflex for this patient. This is a muscle relaxer as is Flexeril. I do not think she needs to be taking both of these. Please see which one of these she has been taking. Thanks.

## 2016-11-28 NOTE — Telephone Encounter (Signed)
Last OV // last filled by Dr,Walker 01/01/16 30 30rf

## 2016-11-29 MED ORDER — CYCLOBENZAPRINE HCL 10 MG PO TABS: 10.0000 mg | ORAL_TABLET | Freq: Three times a day (TID) | ORAL | 2 refills | Status: DC | PRN

## 2016-11-29 NOTE — Telephone Encounter (Signed)
I will refill. CMA did advise the patient that she should not take the zanaflex and the flexeril the same day.

## 2016-11-29 NOTE — Telephone Encounter (Signed)
Left message to return call 

## 2016-11-29 NOTE — Telephone Encounter (Signed)
Patient states she takes tizanadine for headaches as needed. She states she takes flexeril once daily.

## 2016-12-10 DIAGNOSIS — Z170 Estrogen receptor positive status [ER+]: Secondary | ICD-10-CM | POA: Diagnosis not present

## 2016-12-10 DIAGNOSIS — C50411 Malignant neoplasm of upper-outer quadrant of right female breast: Secondary | ICD-10-CM | POA: Diagnosis not present

## 2016-12-10 DIAGNOSIS — Z6832 Body mass index (BMI) 32.0-32.9, adult: Secondary | ICD-10-CM | POA: Diagnosis not present

## 2016-12-14 DIAGNOSIS — G4733 Obstructive sleep apnea (adult) (pediatric): Secondary | ICD-10-CM | POA: Diagnosis not present

## 2016-12-23 ENCOUNTER — Encounter: Payer: Self-pay | Admitting: *Deleted

## 2016-12-24 ENCOUNTER — Encounter: Payer: Self-pay | Admitting: Family Medicine

## 2016-12-24 ENCOUNTER — Other Ambulatory Visit: Payer: Self-pay | Admitting: Gastroenterology

## 2016-12-24 DIAGNOSIS — R1013 Epigastric pain: Secondary | ICD-10-CM | POA: Diagnosis not present

## 2016-12-24 DIAGNOSIS — R1310 Dysphagia, unspecified: Secondary | ICD-10-CM

## 2016-12-24 DIAGNOSIS — R1314 Dysphagia, pharyngoesophageal phase: Secondary | ICD-10-CM | POA: Diagnosis not present

## 2016-12-24 DIAGNOSIS — Z8371 Family history of colonic polyps: Secondary | ICD-10-CM | POA: Diagnosis not present

## 2016-12-24 DIAGNOSIS — R7989 Other specified abnormal findings of blood chemistry: Secondary | ICD-10-CM | POA: Diagnosis not present

## 2016-12-24 MED ORDER — ALPRAZOLAM 0.5 MG PO TABS: ORAL_TABLET | ORAL | 1 refills | Status: DC

## 2016-12-24 NOTE — Telephone Encounter (Signed)
Last OV 11/20/16 last filled 10/31/16 90 1rf

## 2016-12-24 NOTE — Telephone Encounter (Signed)
faxed

## 2016-12-26 ENCOUNTER — Telehealth: Payer: Self-pay | Admitting: Family Medicine

## 2016-12-26 DIAGNOSIS — H02831 Dermatochalasis of right upper eyelid: Secondary | ICD-10-CM | POA: Diagnosis not present

## 2016-12-26 NOTE — Telephone Encounter (Signed)
Pt states that she switched pharmacy to [facility] and needs a new RX for ALPRAZolam (XANAX) 0.5 MG tablet.. Please advise

## 2016-12-26 NOTE — Telephone Encounter (Signed)
Patient notified

## 2016-12-30 DIAGNOSIS — M3500 Sicca syndrome, unspecified: Secondary | ICD-10-CM | POA: Diagnosis not present

## 2016-12-30 DIAGNOSIS — C50411 Malignant neoplasm of upper-outer quadrant of right female breast: Secondary | ICD-10-CM | POA: Diagnosis not present

## 2016-12-30 DIAGNOSIS — Z170 Estrogen receptor positive status [ER+]: Secondary | ICD-10-CM | POA: Diagnosis not present

## 2016-12-30 DIAGNOSIS — R7989 Other specified abnormal findings of blood chemistry: Secondary | ICD-10-CM | POA: Diagnosis not present

## 2016-12-30 DIAGNOSIS — E118 Type 2 diabetes mellitus with unspecified complications: Secondary | ICD-10-CM | POA: Diagnosis not present

## 2016-12-30 DIAGNOSIS — M797 Fibromyalgia: Secondary | ICD-10-CM | POA: Diagnosis not present

## 2016-12-30 DIAGNOSIS — F419 Anxiety disorder, unspecified: Secondary | ICD-10-CM | POA: Diagnosis not present

## 2016-12-30 DIAGNOSIS — E785 Hyperlipidemia, unspecified: Secondary | ICD-10-CM | POA: Diagnosis not present

## 2016-12-31 ENCOUNTER — Ambulatory Visit
Admission: RE | Admit: 2016-12-31 | Discharge: 2016-12-31 | Disposition: A | Discharge status: Home / Self Care | Payer: PPO | Source: Ambulatory Visit | Attending: Gastroenterology | Admitting: Gastroenterology

## 2016-12-31 DIAGNOSIS — R1314 Dysphagia, pharyngoesophageal phase: Secondary | ICD-10-CM | POA: Diagnosis not present

## 2016-12-31 DIAGNOSIS — R1310 Dysphagia, unspecified: Secondary | ICD-10-CM

## 2017-01-01 ENCOUNTER — Encounter: Payer: Self-pay | Admitting: Family Medicine

## 2017-01-14 DIAGNOSIS — G4733 Obstructive sleep apnea (adult) (pediatric): Secondary | ICD-10-CM | POA: Diagnosis not present

## 2017-01-16 ENCOUNTER — Ambulatory Visit
Admission: RE | Admit: 2017-01-16 | Discharge: 2017-01-16 | Disposition: A | Discharge status: Home / Self Care | Payer: PPO | Source: Ambulatory Visit | Attending: Gastroenterology | Admitting: Gastroenterology

## 2017-01-16 ENCOUNTER — Other Ambulatory Visit: Payer: Self-pay | Admitting: Gastroenterology

## 2017-01-16 DIAGNOSIS — R05 Cough: Secondary | ICD-10-CM

## 2017-01-16 DIAGNOSIS — R7989 Other specified abnormal findings of blood chemistry: Secondary | ICD-10-CM | POA: Diagnosis not present

## 2017-01-16 DIAGNOSIS — R059 Cough, unspecified: Secondary | ICD-10-CM

## 2017-01-16 DIAGNOSIS — R1084 Generalized abdominal pain: Secondary | ICD-10-CM

## 2017-01-16 DIAGNOSIS — R0602 Shortness of breath: Secondary | ICD-10-CM | POA: Diagnosis not present

## 2017-01-17 ENCOUNTER — Encounter: Payer: Self-pay | Admitting: Family Medicine

## 2017-01-17 ENCOUNTER — Ambulatory Visit (INDEPENDENT_AMBULATORY_CARE_PROVIDER_SITE_OTHER): Payer: PPO | Admitting: Family Medicine

## 2017-01-17 VITALS — BP 108/68 | HR 82 | Temp 98.3°F | Wt 185.0 lb

## 2017-01-17 DIAGNOSIS — K743 Primary biliary cirrhosis: Secondary | ICD-10-CM | POA: Diagnosis not present

## 2017-01-17 DIAGNOSIS — R079 Chest pain, unspecified: Secondary | ICD-10-CM | POA: Diagnosis not present

## 2017-01-17 DIAGNOSIS — M3500 Sicca syndrome, unspecified: Secondary | ICD-10-CM | POA: Diagnosis not present

## 2017-01-17 DIAGNOSIS — G894 Chronic pain syndrome: Secondary | ICD-10-CM

## 2017-01-17 NOTE — Progress Notes (Signed)
[name], MD Phone: [phone]  Kristina Mccoy is a 61 y.o. female who presents today for follow-up.  Patient notes a number of issues today. She has had significantly more bone pain than previously. Notes her muscles just throb and ache. Joints ache as well over. She reports significant exhaustion that has been going on for years. At times she feels hot and then cold though has no sweats. She's had no weight loss. She does itch sporadically. She saw GI yesterday and they have diagnosed her with primary biliary cirrhosis and have set up a repeat CT scan abdomen and pelvis and liver biopsy. She reports they felt that a lot of her symptoms were related to this. She occasionally gets attacks of right upper quadrant discomfort. She gets nauseous with this. Has loose stools with it. No blood in her stool. She additionally notes chronic intermittent chest pressure and shortness of breath that can occur when she takes a deep breath. This has been going on for years. Sometimes it is exertional and sometimes it is not exertional. Sometimes associated with diaphoresis and sometimes it does not. She's been evaluated by cardiology and pulmonology for this. She had a heart catheterization in 2016 that showed no coronary disease. She's been diagnosed with pneumonia and pleurisy when this is occurred in the past. She reports her pulmonologist felt as though it was possibly related to her autoimmune disorder.  PMH: Former smoker   ROS see history of present illness  Objective  Physical Exam Vitals:   01/17/17 1318  BP: 108/68  Pulse: 82  Temp: 98.3 F (36.8 C)    BP Readings from Last 3 Encounters:  01/17/17 108/68  11/20/16 124/80  10/15/16 118/64   Wt Readings from Last 3 Encounters:  01/17/17 185 lb (83.9 kg)  11/20/16 181 lb (82.1 kg)  10/15/16 184 lb 12.8 oz (83.8 kg)    Physical Exam  Constitutional: No distress.  Cardiovascular: Normal rate, regular rhythm and normal heart  sounds.   Pulmonary/Chest: Effort normal and breath sounds normal.  Abdominal: Soft. Bowel sounds are normal. She exhibits no distension. There is no tenderness. There is no rebound and no guarding.  Minimal epigastric tenderness  Musculoskeletal: She exhibits no edema.  Neurological: She is alert. Gait normal.  Skin: Skin is warm and dry. She is not diaphoretic.   EKG: Normal sinus rhythm, rate 81, no ischemic changes noted  Assessment/Plan: Please see individual problem list.  Sjogren's syndrome ([place]) Followed by rheumatology for this though she would like to switch to a closer rheumatologist. I wonder if many of her issues are related to an autoimmune disorder. We'll refer her to rheumatology a [facility].  Chronic pain disorder Patient with fairly significant scattered pains. She was referred to physiatry previously though they felt as though they could not provide her any benefit and did not schedule her. I wonder if seeing rheumatology and getting on some type of other medication for her autoimmune issues could be beneficial for her pain. Referral has been placed.  Primary biliary cirrhosis (HCC) Recent diagnosis to GI. She'll continue to follow with them. Suspect that this is leading to her exhaustion and itching.  Chest pain Patient with some atypical features and typical features. Symptoms are not consistent in their associated factors. She had no coronary disease in 2016 on a catheterization and has had no change to an EKG and it would seem that a cardiac cause is unlikely given these things. Underlying pulmonary cause unlikely as well given  stable vital signs. Could be part of her larger chronic pain/rheumatologic issue. We discussed seeing if rheumatology had anything to offer given that this may be related to her rheumatologic issues. Could consider follow-up with her pulmonologist. If worsens or changes in any manner she'll be reevaluated.   Orders Placed This Encounter    Procedures  . Ambulatory referral to Rheumatology    Referral Priority:   Routine    Referral Type:   Consultation    Referral Reason:   Specialty Services Required    Requested Specialty:   Rheumatology    Number of Visits Requested:   1  . EKG 12-Lead   [name], MD [place]

## 2017-01-17 NOTE — Patient Instructions (Signed)
Nice to see you. I believe many of your issues are related to your autoimmune diseases and your primary biliary cirrhosis. Please continue follow-up with GI. We'll refer you to rheumatology at [facility]. If you develop any persistent chest pain or shortness of breath please seek medical attention immediately.

## 2017-01-17 NOTE — Assessment & Plan Note (Signed)
Followed by rheumatology for this though she would like to switch to a closer rheumatologist. I wonder if many of her issues are related to an autoimmune disorder. We'll refer her to rheumatology a [facility].

## 2017-01-17 NOTE — Assessment & Plan Note (Signed)
Patient with fairly significant scattered pains. She was referred to physiatry previously though they felt as though they could not provide her any benefit and did not schedule her. I wonder if seeing rheumatology and getting on some type of other medication for her autoimmune issues could be beneficial for her pain. Referral has been placed.

## 2017-01-17 NOTE — Assessment & Plan Note (Signed)
Recent diagnosis to GI. She'll continue to follow with them. Suspect that this is leading to her exhaustion and itching.

## 2017-01-17 NOTE — Assessment & Plan Note (Signed)
Patient with some atypical features and typical features. Symptoms are not consistent in their associated factors. She had no coronary disease in 2016 on a catheterization and has had no change to an EKG and it would seem that a cardiac cause is unlikely given these things. Underlying pulmonary cause unlikely as well given stable vital signs. Could be part of her larger chronic pain/rheumatologic issue. We discussed seeing if rheumatology had anything to offer given that this may be related to her rheumatologic issues. Could consider follow-up with her pulmonologist. If worsens or changes in any manner she'll be reevaluated.

## 2017-01-17 NOTE — Progress Notes (Signed)
Pre visit review using our clinic review tool, if applicable. No additional management support is needed unless otherwise documented below in the visit note. 

## 2017-01-20 ENCOUNTER — Telehealth: Payer: Self-pay | Admitting: Family Medicine

## 2017-01-20 NOTE — Telephone Encounter (Signed)
Pt called looking for an update on some paperwork that she left to have filled out on Friday. Please advise, thank you!  Call pt @ [phone]

## 2017-01-20 NOTE — Telephone Encounter (Signed)
Please advise 

## 2017-01-21 ENCOUNTER — Encounter: Payer: Self-pay | Admitting: Family Medicine

## 2017-01-21 NOTE — Telephone Encounter (Signed)
Informed patient you will be working on this today

## 2017-01-21 NOTE — Telephone Encounter (Signed)
Patient has requested a update on forms being completed.  Pt contact [phone]

## 2017-01-22 ENCOUNTER — Ambulatory Visit
Admission: RE | Admit: 2017-01-22 | Discharge: 2017-01-22 | Disposition: A | Discharge status: Home / Self Care | Payer: PPO | Source: Ambulatory Visit | Attending: Gastroenterology | Admitting: Gastroenterology

## 2017-01-22 ENCOUNTER — Other Ambulatory Visit
Admission: RE | Admit: 2017-01-22 | Discharge: 2017-01-22 | Disposition: A | Discharge status: Home / Self Care | Payer: PPO | Source: Ambulatory Visit | Attending: Gastroenterology | Admitting: Gastroenterology

## 2017-01-22 DIAGNOSIS — R7989 Other specified abnormal findings of blood chemistry: Secondary | ICD-10-CM | POA: Diagnosis not present

## 2017-01-22 DIAGNOSIS — I700 Atherosclerosis of aorta: Secondary | ICD-10-CM | POA: Insufficient documentation

## 2017-01-22 DIAGNOSIS — R1084 Generalized abdominal pain: Secondary | ICD-10-CM | POA: Insufficient documentation

## 2017-01-22 DIAGNOSIS — R1011 Right upper quadrant pain: Secondary | ICD-10-CM | POA: Diagnosis not present

## 2017-01-22 DIAGNOSIS — R110 Nausea: Secondary | ICD-10-CM | POA: Diagnosis not present

## 2017-01-22 LAB — BASIC METABOLIC PANEL
Anion gap: 10 (ref 5–15)
BUN: 19 mg/dL (ref 6–20)
CO2: 23 mmol/L (ref 22–32)
Calcium: 8.5 mg/dL — ABNORMAL LOW (ref 8.9–10.3)
Chloride: 99 mmol/L — ABNORMAL LOW (ref 101–111)
Creatinine, Ser: 0.87 mg/dL (ref 0.44–1.00)
GFR calc Af Amer: >60 mL/min (ref >60)
GFR calc non Af Amer: >60 mL/min (ref >60)
Glucose, Bld: 141 mg/dL — ABNORMAL HIGH (ref 65–99)
Sodium: 132 mmol/L — ABNORMAL LOW (ref 135–145)

## 2017-01-22 MED ORDER — IOPAMIDOL (ISOVUE-300) INJECTION 61%
100.0000 mL | Freq: Once | INTRAVENOUS | Status: AC | PRN
  Administered 2017-01-22: 100 mL via INTRAVENOUS

## 2017-01-22 NOTE — Telephone Encounter (Signed)
Completed on the 24th and given to Janett Billow to give to the patient.

## 2017-01-22 NOTE — Telephone Encounter (Signed)
Patient notified

## 2017-01-24 ENCOUNTER — Encounter: Payer: Self-pay | Admitting: Family Medicine

## 2017-01-28 ENCOUNTER — Other Ambulatory Visit: Payer: Self-pay | Admitting: Gastroenterology

## 2017-01-28 ENCOUNTER — Encounter: Payer: Self-pay | Admitting: Family Medicine

## 2017-01-28 DIAGNOSIS — R7989 Other specified abnormal findings of blood chemistry: Secondary | ICD-10-CM

## 2017-01-28 DIAGNOSIS — R945 Abnormal results of liver function studies: Principal | ICD-10-CM

## 2017-01-29 ENCOUNTER — Other Ambulatory Visit: Payer: Self-pay | Admitting: Family Medicine

## 2017-01-29 DIAGNOSIS — Z13220 Encounter for screening for lipoid disorders: Secondary | ICD-10-CM

## 2017-01-30 DIAGNOSIS — R682 Dry mouth, unspecified: Secondary | ICD-10-CM | POA: Diagnosis not present

## 2017-01-30 DIAGNOSIS — M797 Fibromyalgia: Secondary | ICD-10-CM | POA: Diagnosis not present

## 2017-01-30 DIAGNOSIS — G894 Chronic pain syndrome: Secondary | ICD-10-CM | POA: Diagnosis not present

## 2017-01-30 DIAGNOSIS — H04123 Dry eye syndrome of bilateral lacrimal glands: Secondary | ICD-10-CM | POA: Diagnosis not present

## 2017-01-30 DIAGNOSIS — M2550 Pain in unspecified joint: Secondary | ICD-10-CM | POA: Diagnosis not present

## 2017-02-04 ENCOUNTER — Encounter: Payer: Self-pay | Admitting: Family Medicine

## 2017-02-04 ENCOUNTER — Ambulatory Visit: Payer: PPO

## 2017-02-08 ENCOUNTER — Encounter: Payer: Self-pay | Admitting: Family Medicine

## 2017-02-10 ENCOUNTER — Ambulatory Visit: Admission: RE | Admit: 2017-02-10 | Payer: PPO | Source: Ambulatory Visit

## 2017-02-10 DIAGNOSIS — M069 Rheumatoid arthritis, unspecified: Secondary | ICD-10-CM | POA: Diagnosis not present

## 2017-02-10 DIAGNOSIS — Z87891 Personal history of nicotine dependence: Secondary | ICD-10-CM | POA: Diagnosis not present

## 2017-02-10 DIAGNOSIS — K760 Fatty (change of) liver, not elsewhere classified: Secondary | ICD-10-CM | POA: Diagnosis not present

## 2017-02-10 DIAGNOSIS — K830 Cholangitis: Secondary | ICD-10-CM | POA: Diagnosis not present

## 2017-02-10 DIAGNOSIS — K754 Autoimmune hepatitis: Secondary | ICD-10-CM | POA: Diagnosis not present

## 2017-02-10 DIAGNOSIS — M797 Fibromyalgia: Secondary | ICD-10-CM | POA: Diagnosis not present

## 2017-02-10 DIAGNOSIS — R1011 Right upper quadrant pain: Secondary | ICD-10-CM | POA: Diagnosis not present

## 2017-02-10 DIAGNOSIS — R945 Abnormal results of liver function studies: Secondary | ICD-10-CM | POA: Diagnosis not present

## 2017-02-10 DIAGNOSIS — M3500 Sicca syndrome, unspecified: Secondary | ICD-10-CM | POA: Diagnosis not present

## 2017-02-10 DIAGNOSIS — M25539 Pain in unspecified wrist: Secondary | ICD-10-CM | POA: Diagnosis not present

## 2017-02-10 NOTE — Telephone Encounter (Signed)
GI  Has made referral for patient.

## 2017-02-12 DIAGNOSIS — L680 Hirsutism: Secondary | ICD-10-CM | POA: Diagnosis not present

## 2017-02-12 DIAGNOSIS — Z794 Long term (current) use of insulin: Secondary | ICD-10-CM | POA: Diagnosis not present

## 2017-02-12 DIAGNOSIS — E1165 Type 2 diabetes mellitus with hyperglycemia: Secondary | ICD-10-CM | POA: Diagnosis not present

## 2017-02-13 DIAGNOSIS — G4733 Obstructive sleep apnea (adult) (pediatric): Secondary | ICD-10-CM | POA: Diagnosis not present

## 2017-02-18 DIAGNOSIS — Z794 Long term (current) use of insulin: Secondary | ICD-10-CM | POA: Diagnosis not present

## 2017-02-18 DIAGNOSIS — E1165 Type 2 diabetes mellitus with hyperglycemia: Secondary | ICD-10-CM | POA: Diagnosis not present

## 2017-02-20 ENCOUNTER — Other Ambulatory Visit: Payer: PPO

## 2017-02-20 ENCOUNTER — Other Ambulatory Visit: Payer: Self-pay | Admitting: Family Medicine

## 2017-02-20 NOTE — Telephone Encounter (Signed)
Last OV 01/17/17 last filled 12/24/16 90 1rf

## 2017-02-21 NOTE — Telephone Encounter (Signed)
Patient requested a update on this Rx  Pt contact [phone]

## 2017-02-21 NOTE — Telephone Encounter (Signed)
Patient notified rx has been faxed

## 2017-02-25 DIAGNOSIS — M3509 Sicca syndrome with other organ involvement: Secondary | ICD-10-CM | POA: Diagnosis not present

## 2017-03-04 ENCOUNTER — Encounter: Payer: Self-pay | Admitting: Obstetrics and Gynecology

## 2017-03-06 ENCOUNTER — Telehealth: Payer: Self-pay | Admitting: *Deleted

## 2017-03-06 NOTE — Telephone Encounter (Signed)
Patient requested a call from Dr Caryl Bis in reference to having a tramadol 50 mg prescribed prior to her lab appt.on 03/07/17  Pt contact  [phone]

## 2017-03-07 ENCOUNTER — Other Ambulatory Visit (INDEPENDENT_AMBULATORY_CARE_PROVIDER_SITE_OTHER): Payer: PPO

## 2017-03-07 ENCOUNTER — Other Ambulatory Visit: Payer: Self-pay

## 2017-03-07 DIAGNOSIS — Z13220 Encounter for screening for lipoid disorders: Secondary | ICD-10-CM

## 2017-03-07 LAB — LIPID PANEL
Cholesterol: 186 mg/dL (ref 0–200)
HDL: 40.1 mg/dL
LDL Cholesterol: 114 mg/dL — ABNORMAL HIGH (ref 0–99)
NonHDL: 145.93
Total CHOL/HDL Ratio: 5
Triglycerides: 161 mg/dL — ABNORMAL HIGH (ref 0.0–149.0)
VLDL: 32.2 mg/dL (ref 0.0–40.0)

## 2017-03-07 MED ORDER — TRAMADOL HCL 50 MG PO TABS: 50.0000 mg | ORAL_TABLET | Freq: Three times a day (TID) | ORAL | 0 refills | Status: DC | PRN

## 2017-03-07 NOTE — Telephone Encounter (Signed)
Please check with patient to make sure she has been taking this with no adverse effects, then fax prescription if no adverse effects.

## 2017-03-07 NOTE — Telephone Encounter (Signed)
Left message to return call 

## 2017-03-07 NOTE — Telephone Encounter (Signed)
Patient was given tramadol for abdominal pain and general pain from [facility] and does not see her regularly. Patient would like you to start managing this.

## 2017-03-07 NOTE — Telephone Encounter (Signed)
Left message to notify, faxed to pharmacy

## 2017-03-07 NOTE — Telephone Encounter (Signed)
I would need to see documentation from when this was started. I looked for the last note in care everywhere though have been unable to locate it. Please see if we can get records prior to me prescribing this medication. Thanks.

## 2017-03-11 ENCOUNTER — Telehealth: Payer: Self-pay

## 2017-03-11 NOTE — Telephone Encounter (Signed)
-----   Message from [name], MD sent at 03/10/2017  6:14 PM EDT ----- Please with the patient and her cholesterol is improved slightly though is not at goal given her history of diabetes. She would benefit from a cholesterol medication though these do work by working through the liver. She is currently undergoing evaluation for her liver function. It may be worthwhile waiting to see how her liver function plays out prior to starting her on a cholesterol medicine that may muddy the picture. She should work on diet and exercise. Thanks.

## 2017-03-11 NOTE — Telephone Encounter (Signed)
Left message to return call 

## 2017-03-11 NOTE — Telephone Encounter (Signed)
Patient notified and cholesterol hand out mailed

## 2017-03-16 DIAGNOSIS — G4733 Obstructive sleep apnea (adult) (pediatric): Secondary | ICD-10-CM | POA: Diagnosis not present

## 2017-04-08 ENCOUNTER — Other Ambulatory Visit: Payer: Self-pay | Admitting: Family Medicine

## 2017-04-08 NOTE — Telephone Encounter (Signed)
Last OV 01/17/17 last filled 03/07/17 30 0rf

## 2017-04-08 NOTE — Telephone Encounter (Signed)
faxed

## 2017-04-08 NOTE — Telephone Encounter (Signed)
Please fax

## 2017-04-14 DIAGNOSIS — G4733 Obstructive sleep apnea (adult) (pediatric): Secondary | ICD-10-CM | POA: Diagnosis not present

## 2017-04-15 DIAGNOSIS — G4733 Obstructive sleep apnea (adult) (pediatric): Secondary | ICD-10-CM | POA: Diagnosis not present

## 2017-04-18 ENCOUNTER — Ambulatory Visit: Payer: PPO | Admitting: Family Medicine

## 2017-04-21 ENCOUNTER — Encounter: Payer: Self-pay | Admitting: *Deleted

## 2017-04-22 ENCOUNTER — Encounter
Admission: RE | Disposition: A | Discharge status: Home / Self Care | Payer: Self-pay | Source: Ambulatory Visit | Attending: Gastroenterology

## 2017-04-22 ENCOUNTER — Ambulatory Visit
Admission: RE | Admit: 2017-04-22 | Discharge: 2017-04-22 | Disposition: A | Discharge status: Home / Self Care | Payer: PPO | Source: Ambulatory Visit | Attending: Gastroenterology | Admitting: Gastroenterology

## 2017-04-22 ENCOUNTER — Ambulatory Visit: Payer: PPO | Admitting: Anesthesiology

## 2017-04-22 ENCOUNTER — Encounter: Payer: Self-pay | Admitting: *Deleted

## 2017-04-22 DIAGNOSIS — F419 Anxiety disorder, unspecified: Secondary | ICD-10-CM | POA: Diagnosis not present

## 2017-04-22 DIAGNOSIS — Z79899 Other long term (current) drug therapy: Secondary | ICD-10-CM | POA: Insufficient documentation

## 2017-04-22 DIAGNOSIS — Z794 Long term (current) use of insulin: Secondary | ICD-10-CM | POA: Diagnosis not present

## 2017-04-22 DIAGNOSIS — K210 Gastro-esophageal reflux disease with esophagitis: Secondary | ICD-10-CM | POA: Diagnosis not present

## 2017-04-22 DIAGNOSIS — E785 Hyperlipidemia, unspecified: Secondary | ICD-10-CM | POA: Diagnosis not present

## 2017-04-22 DIAGNOSIS — Z8371 Family history of colonic polyps: Secondary | ICD-10-CM | POA: Insufficient documentation

## 2017-04-22 DIAGNOSIS — K5790 Diverticulosis of intestine, part unspecified, without perforation or abscess without bleeding: Secondary | ICD-10-CM | POA: Diagnosis not present

## 2017-04-22 DIAGNOSIS — K2950 Unspecified chronic gastritis without bleeding: Secondary | ICD-10-CM | POA: Diagnosis not present

## 2017-04-22 DIAGNOSIS — M3500 Sicca syndrome, unspecified: Secondary | ICD-10-CM | POA: Insufficient documentation

## 2017-04-22 DIAGNOSIS — G4730 Sleep apnea, unspecified: Secondary | ICD-10-CM | POA: Insufficient documentation

## 2017-04-22 DIAGNOSIS — M797 Fibromyalgia: Secondary | ICD-10-CM | POA: Diagnosis not present

## 2017-04-22 DIAGNOSIS — K3189 Other diseases of stomach and duodenum: Secondary | ICD-10-CM | POA: Insufficient documentation

## 2017-04-22 DIAGNOSIS — R1310 Dysphagia, unspecified: Secondary | ICD-10-CM | POA: Diagnosis present

## 2017-04-22 DIAGNOSIS — K648 Other hemorrhoids: Secondary | ICD-10-CM | POA: Diagnosis not present

## 2017-04-22 DIAGNOSIS — K5730 Diverticulosis of large intestine without perforation or abscess without bleeding: Secondary | ICD-10-CM | POA: Insufficient documentation

## 2017-04-22 DIAGNOSIS — E119 Type 2 diabetes mellitus without complications: Secondary | ICD-10-CM | POA: Insufficient documentation

## 2017-04-22 DIAGNOSIS — K635 Polyp of colon: Secondary | ICD-10-CM | POA: Insufficient documentation

## 2017-04-22 DIAGNOSIS — K640 First degree hemorrhoids: Secondary | ICD-10-CM | POA: Diagnosis not present

## 2017-04-22 DIAGNOSIS — Z8711 Personal history of peptic ulcer disease: Secondary | ICD-10-CM | POA: Diagnosis not present

## 2017-04-22 DIAGNOSIS — Z853 Personal history of malignant neoplasm of breast: Secondary | ICD-10-CM | POA: Insufficient documentation

## 2017-04-22 DIAGNOSIS — K2960 Other gastritis without bleeding: Secondary | ICD-10-CM | POA: Diagnosis not present

## 2017-04-22 DIAGNOSIS — Z1211 Encounter for screening for malignant neoplasm of colon: Secondary | ICD-10-CM | POA: Diagnosis not present

## 2017-04-22 DIAGNOSIS — K2980 Duodenitis without bleeding: Secondary | ICD-10-CM | POA: Insufficient documentation

## 2017-04-22 DIAGNOSIS — K2990 Gastroduodenitis, unspecified, without bleeding: Secondary | ICD-10-CM | POA: Diagnosis not present

## 2017-04-22 HISTORY — DX: Other myositis, unspecified site: M60.80

## 2017-04-22 HISTORY — DX: Myositis, unspecified: M60.9

## 2017-04-22 HISTORY — PX: ESOPHAGOGASTRODUODENOSCOPY (EGD) WITH PROPOFOL: SHX5813

## 2017-04-22 HISTORY — DX: Myalgia, unspecified site: M79.10

## 2017-04-22 HISTORY — DX: Shortness of breath: R06.02

## 2017-04-22 HISTORY — DX: Chronic pain syndrome: G89.4

## 2017-04-22 HISTORY — PX: COLONOSCOPY WITH PROPOFOL: SHX5780

## 2017-04-22 HISTORY — DX: Epigastric pain: R10.13

## 2017-04-22 HISTORY — DX: Gastro-esophageal reflux disease without esophagitis: K21.9

## 2017-04-22 HISTORY — DX: Sjogren syndrome, unspecified: M35.00

## 2017-04-22 HISTORY — DX: Other myositis, multiple sites: M60.89

## 2017-04-22 HISTORY — DX: Allergic urticaria: L50.0

## 2017-04-22 HISTORY — DX: Coagulation defect, unspecified: D68.9

## 2017-04-22 HISTORY — DX: Systemic involvement of connective tissue, unspecified: M35.9

## 2017-04-22 HISTORY — DX: Sleep apnea, unspecified: G47.30

## 2017-04-22 LAB — CBC
HCT: 42.6 % (ref 35.0–47.0)
Hemoglobin: 14.4 g/dL (ref 12.0–16.0)
MCH: 30 pg (ref 26.0–34.0)
MCHC: 33.8 g/dL (ref 32.0–36.0)
MCV: 88.8 fL (ref 80.0–100.0)
Platelets: 193 10*3/uL (ref 150–440)
RBC: 4.79 MIL/uL (ref 3.80–5.20)
RDW: 14.3 % (ref 11.5–14.5)
WBC: 11.8 10*3/uL — ABNORMAL HIGH (ref 3.6–11.0)

## 2017-04-22 LAB — PROTIME-INR
INR: 1
Prothrombin Time: 13.2 seconds (ref 11.4–15.2)

## 2017-04-22 LAB — GLUCOSE, CAPILLARY: Glucose-Capillary: 157 mg/dL — ABNORMAL HIGH (ref 65–99)

## 2017-04-22 SURGERY — COLONOSCOPY WITH PROPOFOL
Anesthesia: General

## 2017-04-22 MED ORDER — LIDOCAINE HCL (PF) 2 % IJ SOLN
INTRAMUSCULAR | Status: AC
  Filled 2017-04-22: qty 2

## 2017-04-22 MED ORDER — GLYCOPYRROLATE 0.2 MG/ML IJ SOLN
INTRAMUSCULAR | Status: AC
  Filled 2017-04-22: qty 1

## 2017-04-22 MED ORDER — GLYCOPYRROLATE 0.2 MG/ML IJ SOLN
INTRAMUSCULAR | Status: DC | PRN
  Administered 2017-04-22: 0.1 mg via INTRAVENOUS

## 2017-04-22 MED ORDER — FENTANYL CITRATE (PF) 100 MCG/2ML IJ SOLN
INTRAMUSCULAR | Status: AC
  Filled 2017-04-22: qty 2

## 2017-04-22 MED ORDER — SODIUM CHLORIDE 0.9 % IV SOLN
INTRAVENOUS | Status: DC
  Administered 2017-04-22: 1000 mL via INTRAVENOUS

## 2017-04-22 MED ORDER — SODIUM CHLORIDE 0.9 % IV SOLN
INTRAVENOUS | Status: DC
  Administered 2017-04-22: 08:00:00 via INTRAVENOUS

## 2017-04-22 MED ORDER — FENTANYL CITRATE (PF) 100 MCG/2ML IJ SOLN
INTRAMUSCULAR | Status: DC | PRN
  Administered 2017-04-22: 50 ug via INTRAVENOUS

## 2017-04-22 MED ORDER — PROPOFOL 10 MG/ML IV BOLUS
INTRAVENOUS | Status: AC
  Filled 2017-04-22: qty 20

## 2017-04-22 MED ORDER — PROPOFOL 500 MG/50ML IV EMUL
INTRAVENOUS | Status: DC | PRN
  Administered 2017-04-22: 120 ug/kg/min via INTRAVENOUS

## 2017-04-22 MED ORDER — MIDAZOLAM HCL 2 MG/2ML IJ SOLN
INTRAMUSCULAR | Status: DC | PRN
  Administered 2017-04-22: 2 mg via INTRAVENOUS

## 2017-04-22 MED ORDER — MIDAZOLAM HCL 2 MG/2ML IJ SOLN
INTRAMUSCULAR | Status: AC
  Filled 2017-04-22: qty 2

## 2017-04-22 MED ORDER — LIDOCAINE HCL (CARDIAC) 20 MG/ML IV SOLN
INTRAVENOUS | Status: DC | PRN
  Administered 2017-04-22: 30 mg via INTRAVENOUS

## 2017-04-22 MED ORDER — PROPOFOL 500 MG/50ML IV EMUL
INTRAVENOUS | Status: AC
  Filled 2017-04-22: qty 50

## 2017-04-22 NOTE — Transfer of Care (Signed)
Immediate Anesthesia Transfer of Care Note  Patient: Kristina Mccoy  Procedure(s) Performed: Procedure(s): COLONOSCOPY WITH PROPOFOL (N/A) ESOPHAGOGASTRODUODENOSCOPY (EGD) WITH PROPOFOL (N/A)  Patient Location: PACU  Anesthesia Type:General  Level of Consciousness: awake and sedated  Airway & Oxygen Therapy: Patient Spontanous Breathing and Patient connected to nasal cannula oxygen  Post-op Assessment: Report given to RN and Post -op Vital signs reviewed and stable  Post vital signs: Reviewed and stable  Last Vitals: There were no vitals filed for this visit.  Last Pain: There were no vitals filed for this visit.       Complications: No apparent anesthesia complications

## 2017-04-22 NOTE — Op Note (Addendum)
[facility] Gastroenterology Patient Name: Kristina Mccoy Procedure Date: 04/22/2017 8:19 AM MRN: [id number] Account #: [id number] Date of Birth: 26-Mar-1956 Admit Type: Outpatient Age: 61 Room: [facility] ENDO ROOM 3 Gender: Female Note Status: Finalized Procedure:            Upper GI endoscopy Indications:          Epigastric abdominal pain, Abdominal pain in the right                        upper quadrant, Dyspepsia, Dysphagia Providers:            Lollie Sails, MD Referring MD:         Angela Adam. Caryl Bis (Referring MD) Medicines:            Monitored Anesthesia Care Complications:        No immediate complications. Procedure:            Pre-Anesthesia Assessment:                       - ASA Grade Assessment: III - A patient with severe                        systemic disease.                       After obtaining informed consent, the endoscope was                        passed under direct vision. Throughout the procedure,                        the patient's blood pressure, pulse, and oxygen                        saturations were monitored continuously. The Endoscope                        was introduced through the mouth, and advanced to the                        fourth part of duodenum. The upper GI endoscopy was                        accomplished without difficulty. The patient tolerated                        the procedure well. The upper GI endoscopy was                        accomplished without difficulty. Findings:      The Z-line was irregular. Biopsies were taken with a cold forceps for       histology.      no evidence of stenosis or stricture throughout      Diffuse mild inflammation characterized by congestion (edema) and       erythema was found in the gastric body and in the gastric antrum.       Biopsies were taken with a cold forceps for histology.      The cardia and gastric fundus were normal on retroflexion.      Diffuse mild  mucosal  variance characterized by smoothness was found in       the entire duodenum. Biopsies were taken with a cold forceps for       histology. Impression:           - Z-line irregular. Biopsied.                       - Bile gastritis. Biopsied.                       - Diffuse mild mucosal variance characterized by                        smoothness was found in the entire duodenum. Biopsied. Recommendation:       - Use Aciphex (rabeprazole) 20 mg PO daily daily.                        Consider abdominal ultrasound and HIDA/CCK if not yet                        done. Procedure Code(s):    --- Professional ---                       [phone], Esophagogastroduodenoscopy, flexible, transoral;                        with biopsy, single or multiple Diagnosis Code(s):    --- Professional ---                       K22.8, Other specified diseases of esophagus                       K29.60, Other gastritis without bleeding                       K31.89, Other diseases of stomach and duodenum                       R10.13, Epigastric pain                       R10.11, Right upper quadrant pain                       R13.10, Dysphagia, unspecified CPT copyright 2016 American Medical Association. All rights reserved. The codes documented in this report are preliminary and upon coder review may  be revised to meet current compliance requirements. Lollie Sails, MD 04/22/2017 8:48:44 AM This report has been signed electronically. Number of Addenda: 0 Note Initiated On: 04/22/2017 8:19 AM      [facility]

## 2017-04-22 NOTE — H&P (Signed)
Outpatient short stay form Pre-procedure 04/22/2017 7:49 AM Kristina Sails MD  Primary Physician: Dr. [name]  Reason for visit:  EGD and colonoscopy  History of present illness:  Patient is a 61 year old female presenting today as above. She has complaint of some dysphagia. She states that it is more so like a full feeling in the esophagus this is in talking with her more consistent with globus sensation. She does have a history of some anxiety and states that the symptoms get better when she takes an anxiolytic pill. She did have a barium swallow that showed a standardized tablet passing normally and some diffuse degenerative changes in the cervical thoracic spine. There was no obstructing abnormality noted. She tolerated her colonoscopy prep well. She takes no aspirin or blood thinning agents. She does have a history of an elevated d-dimer of uncertain etiology number of years ago. That has not been an issue. She has had no clotting phenomena stroke or embolic phenomenon.    Current Facility-Administered Medications:  .  0.9 %  sodium chloride infusion, , Intravenous, Continuous, Kristina Sails, MD .  0.9 %  sodium chloride infusion, , Intravenous, Continuous, Kristina Sails, MD  Prescriptions Prior to Admission  Medication Sig Dispense Refill Last Dose  . albuterol (PROVENTIL HFA;VENTOLIN HFA) 108 (90 Base) MCG/ACT inhaler Inhale 2 puffs into the lungs every 6 (six) hours as needed for wheezing or shortness of breath. 1 Inhaler 0 Taking  . ALPRAZolam (XANAX) 0.5 MG tablet TAKE 1 TABLET BY MOUTH 4 TIMES A DAY AS NEEDED FOR ANXIETY 90 tablet 1 04/21/2017 at Unknown time  . cyclobenzaprine (FLEXERIL) 10 MG tablet Take 1 tablet (10 mg total) by mouth 3 (three) times daily as needed for muscle spasms. 90 tablet 2 Taking  . dicyclomine (BENTYL) 10 MG capsule Take 1 capsule (10 mg total) by mouth 3 (three) times daily as needed for spasms. 60 capsule 1 Taking  . esomeprazole  (NEXIUM) 40 MG capsule Take 40 mg by mouth daily at 12 noon.   04/21/2017 at Unknown time  . fexofenadine-pseudoephedrine (ALLEGRA-D 24) 180-240 MG 24 hr tablet Take 1 tablet by mouth daily.     Marland Kitchen glipiZIDE (GLUCOTROL) 10 MG tablet Take 1 tablet (10 mg total) by mouth 2 (two) times daily before a meal. 60 tablet 3 04/21/2017 at Unknown time  . glipiZIDE (GLUCOTROL) 10 MG tablet Take 10 mg by mouth daily before breakfast.     . hyoscyamine (LEVSIN, ANASPAZ) 0.125 MG tablet TAKE ONE TABLET BY MOUTH EVERY 4 HOURS AS NEEDED 90 tablet 3 Taking  . insulin NPH Human (HUMULIN N,NOVOLIN N) 100 UNIT/ML injection Inject 0.05 mLs (5 Units total) into the skin 2 (two) times daily before a meal. 10 mL 11 Past Week at Unknown time  . Insulin Syringe-Needle U-100 (INSULIN SYRINGE .5CC/31GX5/16") 31G X 5/16" 0.5 ML MISC Use twice daily with injections   Past Week at Unknown time  . polyethylene glycol (MIRALAX / GLYCOLAX) packet Take 17 g by mouth daily.     . prochlorperazine (COMPAZINE) 10 MG tablet Take 10 mg by mouth every 6 (six) hours as needed.   Taking  . Biotin 1 MG CAPS Take by mouth.   Not Taking at Unknown time  . cetirizine (ZYRTEC) 10 MG tablet Take by mouth.   Not Taking at Unknown time  . Cholecalciferol (VITAMIN D3) 1000 units CAPS Take by mouth.   Not Taking at Unknown time  . clobetasol ointment (TEMOVATE) 0.05 %  Apply topically.   Taking  . Cyanocobalamin (RA VITAMIN B-12 TR) 1000 MCG TBCR Take by mouth.   Not Taking at Unknown time  . Emollient (CERAVE) CREA Apply topically.   Taking  . FREESTYLE LITE test strip CHECK 6 TIMES DAILY 100 each 3 Taking  . glucose blood (ONE TOUCH ULTRA TEST) test strip CHECK SIX TIMES DAILY 100 each 11 Taking  . hydrOXYzine (ATARAX/VISTARIL) 10 MG tablet Take by mouth.   Taking  . ibuprofen (ADVIL,MOTRIN) 600 MG tablet Take 600 mg by mouth.   Not Taking at Unknown time  . Lancets MISC Use. BAYER CONTOUR LANCETS   Taking  . metFORMIN (GLUCOPHAGE-XR) 500 MG 24 hr  tablet Take 500 mg by mouth daily with breakfast.   Not Taking at Unknown time  . ONETOUCH DELICA LANCETS 56L MISC TWICE DAILY 100 each 3 Taking  . RABEprazole (ACIPHEX) 20 MG tablet Take 20 mg by mouth daily.   Taking  . ranitidine (ZANTAC) 150 MG capsule Take 150 mg by mouth daily.   Taking  . tamoxifen (NOLVADEX) 20 MG tablet Take 20 mg by mouth daily.   Taking  . tiZANidine (ZANAFLEX) 2 MG tablet TAKE ONE TABLET BY MOUTH EVERY EIGHT HOURS AS NEEDED (HEADACHES) 60 tablet 1 Taking  . traMADol (ULTRAM) 50 MG tablet TAKE 1 TABLET BY MOUTH EVERY 8 HOURS AS NEEDED 30 tablet 0      Allergies  Allergen Reactions  . Gabapentin Itching and Swelling  . Oxycodone Itching and Rash  . Carbamazepine Nausea And Vomiting and Other (See Comments)    Abdominal pain   . Insulin Glargine Other (See Comments)    BLOATING, MUSCLE/JOINT PAIN  . Methylprednisolone     Reddened face, puffy face   . Morphine Other (See Comments)  . Pregabalin Swelling    Swelling in arms and legs  . Venlafaxine Other (See Comments)  . Buprenorphine Rash and Swelling  . Canagliflozin Other (See Comments) and Rash    YEAST INFECTIONS  . Hydroxychloroquine Rash     Past Medical History:  Diagnosis Date  . Abdominal pain, epigastric   . Allergic urticaria   . Blood clotting disorder ([place])   . Breast cancer ([place]) 5/14   left breast  . Breast cancer ([place]) 02/2013   Dr Laurance Flatten ([place])  . Diabetes mellitus   . Diverticulosis   . Fibromyalgia   . GERD (gastroesophageal reflux disease)   . Heart murmur   . Hyperlipidemia   . Lymphedema of arm    right  . Myalgia   . Myositis   . Neuromyositis   . Otitis media, chronic   . Pain syndrome, chronic   . Peptic ulcer   . Sicca syndrome ([place])   . Sjogren's disease ([place])   . Sleep apnea   . SOB (shortness of breath)   . 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    HEENT: Normocephalic atraumatic eyes are anicteric    Other:     Pertinant exam for procedure: Soft nontender nondistended bowel sounds positive normoactive.    Planned proceedures: EGD and colonoscopy and indicated procedures. I have discussed the risks benefits and complications of procedures to include not limited to bleeding, infection, perforation and the risk of sedation and the patient wishes to proceed.    Hassell Done  Kassie Mends, MD Gastroenterology 04/22/2017  7:49 AM

## 2017-04-22 NOTE — Anesthesia Postprocedure Evaluation (Signed)
Anesthesia Post Note  Patient: Teliah Dorian  Procedure(s) Performed: Procedure(s) (LRB): COLONOSCOPY WITH PROPOFOL (N/A) ESOPHAGOGASTRODUODENOSCOPY (EGD) WITH PROPOFOL (N/A)  Patient location during evaluation: Endoscopy Anesthesia Type: General Level of consciousness: awake and alert Pain management: pain level controlled Vital Signs Assessment: post-procedure vital signs reviewed and stable Respiratory status: spontaneous breathing, nonlabored ventilation, respiratory function stable and patient connected to nasal cannula oxygen Cardiovascular status: blood pressure returned to baseline and stable Postop Assessment: no signs of nausea or vomiting Anesthetic complications: no     Last Vitals:  Vitals:   04/22/17 0935 04/22/17 0945  BP: (!) 126/91 121/65  Pulse: 73 64  Resp: 18 11  Temp:      Last Pain:  Vitals:   04/22/17 0915  TempSrc: Tympanic                 Martha Clan

## 2017-04-22 NOTE — Op Note (Signed)
[facility] Gastroenterology Patient Name: Kristina Mccoy Procedure Date: 04/22/2017 8:19 AM MRN: [id number] Account #: [id number] Date of Birth: 1955/10/26 Admit Type: Outpatient Age: 61 Room: [facility] ENDO ROOM 3 Gender: Female Note Status: Finalized Procedure:            Colonoscopy Indications:          Family history of colonic polyps in a first-degree                        relative Providers:            Lollie Sails, MD Referring MD:         Angela Adam. Caryl Bis (Referring MD) Medicines:            Monitored Anesthesia Care Complications:        No immediate complications. Procedure:            Pre-Anesthesia Assessment:                       - ASA Grade Assessment: III - A patient with severe                        systemic disease.                       After obtaining informed consent, the colonoscope was                        passed under direct vision. Throughout the procedure,                        the patient's blood pressure, pulse, and oxygen                        saturations were monitored continuously. The                        Colonoscope was introduced through the anus and                        advanced to the the cecum, identified by appendiceal                        orifice and ileocecal valve. The colonoscopy was                        performed without difficulty. The patient tolerated the                        procedure well. The quality of the bowel preparation                        was good. Findings:      A 2 mm polyp was found in the ileocecal valve. The polyp was sessile.       The polyp was removed with a cold biopsy forceps. Resection and       retrieval were complete.      Multiple small-mouthed diverticula were found in the sigmoid colon and       descending colon.      Non-bleeding internal hemorrhoids were found during anoscopy. The  hemorrhoids were small and Grade I (internal hemorrhoids that do not   prolapse).      The digital rectal exam was normal.      The exam was otherwise normal throughout the examined colon. Impression:           - One 2 mm polyp at the ileocecal valve, removed with a                        cold biopsy forceps. Resected and retrieved.                       - Diverticulosis in the sigmoid colon and in the                        descending colon.                       - Non-bleeding internal hemorrhoids. Recommendation:       - Discharge patient to home.                       - Telephone GI clinic for pathology results in 1 week. Procedure Code(s):    --- Professional ---                       [phone], Colonoscopy, flexible; with biopsy, single or                        multiple Diagnosis Code(s):    --- Professional ---                       D12.0, Benign neoplasm of cecum                       K64.0, First degree hemorrhoids                       Z83.71, Family history of colonic polyps                       K57.30, Diverticulosis of large intestine without                        perforation or abscess without bleeding CPT copyright 2016 [employer] Association. All rights reserved. The codes documented in this report are preliminary and upon coder review may  be revised to meet current compliance requirements. Lollie Sails, MD 04/22/2017 9:16:28 AM This report has been signed electronically. Number of Addenda: 0 Note Initiated On: 04/22/2017 8:19 AM Scope Withdrawal Time: 0 hours 6 minutes 32 seconds  Total Procedure Duration: 0 hours 20 minutes 33 seconds       [facility]

## 2017-04-22 NOTE — Anesthesia Preprocedure Evaluation (Signed)
Anesthesia Evaluation  Patient identified by MRN, date of birth, ID band Patient awake    Reviewed: Allergy & Precautions, H&P , NPO status , Patient's Chart, lab work & pertinent test results, reviewed documented beta blocker date and time   History of Anesthesia Complications Negative for: history of anesthetic complications  Airway Mallampati: III  TM Distance: >3 FB Neck ROM: full    Dental  (+) Caps, Partial Lower, Missing, Dental Advidsory Given   Pulmonary neg shortness of breath, sleep apnea (mild) , neg COPD, neg recent URI, former smoker,           Cardiovascular Exercise Tolerance: Good (-) hypertension(-) angina+ Peripheral Vascular Disease  (-) CAD, (-) Past MI, (-) Cardiac Stents and (-) CABG (-) dysrhythmias + Valvular Problems/Murmurs      Neuro/Psych neg Seizures PSYCHIATRIC DISORDERS Fibromyalgia Neuromuscular disease    GI/Hepatic PUD, GERD  ,NAFLD and PBC   Endo/Other  diabetes  Renal/GU negative Renal ROS  negative genitourinary   Musculoskeletal   Abdominal   Peds  Hematology negative hematology ROS (+)   Anesthesia Other Findings Past Medical History: No date: Abdominal pain, epigastric No date: Allergic urticaria No date: Blood clotting disorder ([place]) 5/14: Breast cancer ([place])     Comment:  left breast 02/2013: Breast cancer ([place])     Comment:  Dr Laurance Flatten ([facility]) No date: Diabetes mellitus No date: Diverticulosis No date: Fibromyalgia No date: GERD (gastroesophageal reflux disease) No date: Heart murmur No date: Hyperlipidemia No date: Lymphedema of arm     Comment:  right No date: Myalgia No date: Myositis 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 No date: Otitis media, chronic No date: Pain syndrome, chronic No date: Peptic ulcer No date: Sicca syndrome (HCC) No date: Sjogren's disease (HCC) No date: Sleep apnea No date: SOB (shortness of breath) No date: Systemic involvement of connective  tissue (HCC)   Reproductive/Obstetrics negative OB ROS                             Anesthesia Physical Anesthesia Plan  ASA: III  Anesthesia Plan: General   Post-op Pain Management:    Induction: Intravenous  PONV Risk Score and Plan: 3 and Propofol  Airway Management Planned: Natural Airway and Nasal Cannula  Additional Equipment:   Intra-op Plan:   Post-operative Plan:   Informed Consent: I have reviewed the patients History and Physical, chart, labs and discussed the procedure including the risks, benefits and alternatives for the proposed anesthesia with the patient or authorized representative who has indicated his/her understanding and acceptance.   Dental Advisory Given  Plan Discussed with: Anesthesiologist, CRNA and Surgeon  Anesthesia Plan Comments:         Anesthesia Quick Evaluation

## 2017-04-22 NOTE — Anesthesia Procedure Notes (Signed)
Performed by: COOK-MARTIN, Shatima Zalar Pre-anesthesia Checklist: Patient identified, Emergency Drugs available, Suction available, Patient being monitored and Timeout performed Patient Re-evaluated:Patient Re-evaluated prior to induction Oxygen Delivery Method: Nasal cannula Preoxygenation: Pre-oxygenation with 100% oxygen Induction Type: IV induction Placement Confirmation: positive ETCO2 and CO2 detector       

## 2017-04-22 NOTE — Anesthesia Post-op Follow-up Note (Cosign Needed)
Anesthesia QCDR form completed.        

## 2017-04-23 ENCOUNTER — Ambulatory Visit (INDEPENDENT_AMBULATORY_CARE_PROVIDER_SITE_OTHER): Payer: PPO | Admitting: Family Medicine

## 2017-04-23 ENCOUNTER — Encounter: Payer: Self-pay | Admitting: Gastroenterology

## 2017-04-23 ENCOUNTER — Other Ambulatory Visit: Payer: Self-pay | Admitting: Family Medicine

## 2017-04-23 DIAGNOSIS — M3500 Sicca syndrome, unspecified: Secondary | ICD-10-CM | POA: Diagnosis not present

## 2017-04-23 DIAGNOSIS — K743 Primary biliary cirrhosis: Secondary | ICD-10-CM | POA: Diagnosis not present

## 2017-04-23 DIAGNOSIS — E1165 Type 2 diabetes mellitus with hyperglycemia: Secondary | ICD-10-CM | POA: Diagnosis not present

## 2017-04-23 DIAGNOSIS — E1142 Type 2 diabetes mellitus with diabetic polyneuropathy: Secondary | ICD-10-CM | POA: Diagnosis not present

## 2017-04-23 DIAGNOSIS — I70219 Atherosclerosis of native arteries of extremities with intermittent claudication, unspecified extremity: Secondary | ICD-10-CM | POA: Diagnosis not present

## 2017-04-23 DIAGNOSIS — Z794 Long term (current) use of insulin: Secondary | ICD-10-CM | POA: Diagnosis not present

## 2017-04-23 DIAGNOSIS — R21 Rash and other nonspecific skin eruption: Secondary | ICD-10-CM | POA: Diagnosis not present

## 2017-04-23 DIAGNOSIS — IMO0002 Reserved for concepts with insufficient information to code with codable children: Secondary | ICD-10-CM

## 2017-04-23 LAB — SURGICAL PATHOLOGY

## 2017-04-23 NOTE — Telephone Encounter (Signed)
Last OV 01/17/17 last filled 02/21/17 90 1rf

## 2017-04-23 NOTE — Assessment & Plan Note (Signed)
Continue to follow with GI to determine the next step in management.

## 2017-04-23 NOTE — Assessment & Plan Note (Signed)
Followed by rheumatology. It appears that this may be causing all of her pain and the number of her other issues. I encouraged her to continue to follow with rheumatology.

## 2017-04-23 NOTE — Assessment & Plan Note (Signed)
Unsure if patient has any claudication. Not sure that she has vascular disease either given that she does have good pulses in bilateral feet. We'll request her prior records.

## 2017-04-23 NOTE — Progress Notes (Signed)
[name], MD Phone: [phone]  Kristina Mccoy is a 61 y.o. female who presents today for follow-up.  Diabetes: Last A1c was 8.3. Her endocrinologist tried to make some changes to her regimen though she did not tolerate adding metformin or trulicity. She's currently taking NPH 10 units in the morning and 37 units at night. Also taking glipizide. Some polyuria. No polydipsia. Occasional hypoglycemia where she gets shaky and sweaty and eats chocolate this improves. Saw ophthalmology 4 months ago.  Has seen a GI specialist regarding her liver. The note was reviewed. It appears they decided to hold off on biopsy at this point as her liver function tests have trended down some. They discussed weight loss and decreasing carb intake. She has done good with this and is eating lots of vegetables. She notes she has lost inches as her clothes are fitting better though her weight has not changed significantly.  Notes a rash on her posterior calf on the right side. It has been there for a long time. It'll flare at times and itch. Never goes away. Has never been evaluated by dermatology. She's not tried anything on it.  Continues to have chronic bone and muscle aching. She's followed by rheumatology. Their note was reviewed. It appears they believe her symptoms are related to Sjogren syndrome. She was tried on Plaquenil though did not tolerate this. I tried to relay what their note stated to the patient to help her understand.  Patient reports she has been repeatedly asked about a blood clotting disorder that is listed in her chart. She reports she's never been told that she has a blood clotting disorder. She never had any bleeding issues or history of blood clots. She does note her d-dimer was elevated on a couple occasions though had negative CT scans.  She reports she saw [facility] in the past for pain in her legs. She occasionally does get some pain with walking though does not  worse if she walks more. She has no rest pain. She reports the workup she had previously was unremarkable.  PMH: Former smoker   ROS see history of present illness  Objective  Physical Exam Vitals:   04/23/17 1433  BP: 138/70  Pulse: 87  Temp: 97.8 F (36.6 C)    BP Readings from Last 3 Encounters:  04/23/17 138/70  04/22/17 121/65  01/17/17 108/68   Wt Readings from Last 3 Encounters:  04/23/17 184 lb 3.2 oz (83.6 kg)  04/22/17 180 lb (81.6 kg)  01/17/17 185 lb (83.9 kg)    Physical Exam  Constitutional: No distress.  Cardiovascular: Normal rate, regular rhythm and normal heart sounds.   Pulmonary/Chest: Effort normal and breath sounds normal.  Abdominal: Soft. Bowel sounds are normal. She exhibits no distension. There is tenderness (Chronically mildly tender throughout upper abdomen). There is no rebound and no guarding.  Musculoskeletal: She exhibits no edema.  2+ DP and PT pulses bilaterally  Neurological: She is alert. Gait normal.  Skin: Skin is warm and dry. She is not diaphoretic.   papular rash right mid posterior calf   Assessment/Plan: Please see individual problem list.  Atherosclerotic peripheral vascular disease with intermittent claudication Unsure if patient has any claudication. Not sure that she has vascular disease either given that she does have good pulses in bilateral feet. We'll request her prior records.  Sjogren's syndrome ([place]) Followed by rheumatology. It appears that this may be causing all of her pain and the number of her other issues.  I encouraged her to continue to follow with rheumatology.  Primary biliary cirrhosis (HCC) Continue to follow with GI to determine the next step in management.  Diabetes type 2, uncontrolled ([place]) Continue current regimen and continue to follow with endocrinology.  Rash Slight rash right mid posterior calf. Encouraged her to try clobetasol which she already has on this. If this is not beneficial she  needs to see her dermatologist and she is aware of this.  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 She has followed with hematology and oncology in the past for this.  [name], MD [place]

## 2017-04-23 NOTE — Telephone Encounter (Signed)
faxed

## 2017-04-23 NOTE — Assessment & Plan Note (Signed)
Slight rash right mid posterior calf. Encouraged her to try clobetasol which she already has on this. If this is not beneficial she needs to see her dermatologist and she is aware of this.

## 2017-04-23 NOTE — Patient Instructions (Signed)
Nice to see you. Please try the clobetasol on the rash on your right calf. If this does not improve you will need to see your dermatologist. [facility] request records from [facility]. Please follow up with endocrinology and GI. Please continue to see rheumatology as well.

## 2017-04-23 NOTE — Assessment & Plan Note (Signed)
Continue current regimen and continue to follow with endocrinology.

## 2017-04-24 ENCOUNTER — Other Ambulatory Visit: Payer: Self-pay | Admitting: Gastroenterology

## 2017-04-24 ENCOUNTER — Encounter: Payer: Self-pay | Admitting: Family Medicine

## 2017-04-24 DIAGNOSIS — R1011 Right upper quadrant pain: Secondary | ICD-10-CM

## 2017-04-30 ENCOUNTER — Other Ambulatory Visit: Payer: Self-pay | Admitting: Family Medicine

## 2017-04-30 DIAGNOSIS — I70219 Atherosclerosis of native arteries of extremities with intermittent claudication, unspecified extremity: Secondary | ICD-10-CM

## 2017-05-01 ENCOUNTER — Encounter
Admission: RE | Admit: 2017-05-01 | Discharge: 2017-05-01 | Disposition: A | Discharge status: Home / Self Care | Payer: PPO | Source: Ambulatory Visit | Attending: Gastroenterology | Admitting: Gastroenterology

## 2017-05-01 ENCOUNTER — Ambulatory Visit
Admission: RE | Admit: 2017-05-01 | Discharge: 2017-05-01 | Disposition: A | Discharge status: Home / Self Care | Payer: PPO | Source: Ambulatory Visit | Attending: Gastroenterology | Admitting: Gastroenterology

## 2017-05-01 DIAGNOSIS — R1011 Right upper quadrant pain: Secondary | ICD-10-CM

## 2017-05-01 MED ORDER — TECHNETIUM TC 99M MEBROFENIN IV KIT
5.0000 | PACK | Freq: Once | INTRAVENOUS | Status: AC | PRN
  Administered 2017-05-01: 5.52 via INTRAVENOUS

## 2017-05-06 ENCOUNTER — Ambulatory Visit: Admit: 2017-05-06 | Payer: PPO | Admitting: Ophthalmology

## 2017-05-06 SURGERY — BLEPHAROPLASTY
Anesthesia: LOCAL | Laterality: Bilateral

## 2017-05-08 ENCOUNTER — Other Ambulatory Visit: Payer: Self-pay

## 2017-05-08 ENCOUNTER — Other Ambulatory Visit: Payer: Self-pay | Admitting: Family Medicine

## 2017-05-08 NOTE — Telephone Encounter (Signed)
Last OV 04/23/17 last filled 04/08/17 30 0rf

## 2017-05-08 NOTE — Telephone Encounter (Signed)
Patient states she has had no side effects and it has helped a lot.

## 2017-05-08 NOTE — Telephone Encounter (Signed)
Left message to return call 

## 2017-05-08 NOTE — Telephone Encounter (Signed)
Please confirm that the patient is taking the tramadol with no adverse effects. Once you confirm this we can refill it. Thanks.

## 2017-05-09 MED ORDER — TRAMADOL HCL 50 MG PO TABS: 50.0000 mg | ORAL_TABLET | Freq: Three times a day (TID) | ORAL | 0 refills | Status: DC | PRN

## 2017-05-09 NOTE — Telephone Encounter (Signed)
faxed

## 2017-05-16 DIAGNOSIS — G4733 Obstructive sleep apnea (adult) (pediatric): Secondary | ICD-10-CM | POA: Diagnosis not present

## 2017-06-03 ENCOUNTER — Encounter (INDEPENDENT_AMBULATORY_CARE_PROVIDER_SITE_OTHER): Payer: Self-pay | Admitting: Vascular Surgery

## 2017-06-03 ENCOUNTER — Ambulatory Visit (INDEPENDENT_AMBULATORY_CARE_PROVIDER_SITE_OTHER): Payer: PPO | Admitting: Vascular Surgery

## 2017-06-03 VITALS — BP 115/67 | HR 73 | Resp 17 | Ht 64.0 in | Wt 183.0 lb

## 2017-06-03 DIAGNOSIS — E1165 Type 2 diabetes mellitus with hyperglycemia: Secondary | ICD-10-CM | POA: Diagnosis not present

## 2017-06-03 DIAGNOSIS — Z794 Long term (current) use of insulin: Secondary | ICD-10-CM | POA: Diagnosis not present

## 2017-06-03 DIAGNOSIS — E1142 Type 2 diabetes mellitus with diabetic polyneuropathy: Secondary | ICD-10-CM | POA: Diagnosis not present

## 2017-06-03 DIAGNOSIS — D689 Coagulation defect, unspecified: Secondary | ICD-10-CM

## 2017-06-03 DIAGNOSIS — M79605 Pain in left leg: Secondary | ICD-10-CM | POA: Diagnosis not present

## 2017-06-03 DIAGNOSIS — M79604 Pain in right leg: Secondary | ICD-10-CM

## 2017-06-03 DIAGNOSIS — IMO0002 Reserved for concepts with insufficient information to code with codable children: Secondary | ICD-10-CM

## 2017-06-03 DIAGNOSIS — M79609 Pain in unspecified limb: Secondary | ICD-10-CM | POA: Insufficient documentation

## 2017-06-03 NOTE — Assessment & Plan Note (Signed)
blood glucose control important in reducing the progression of atherosclerotic disease. Also, involved in wound healing. On appropriate medications.  

## 2017-06-03 NOTE — Assessment & Plan Note (Signed)
Makes concern for vascular disease higher.

## 2017-06-03 NOTE — Progress Notes (Signed)
Patient ID: Kristina Mccoy, female   DOB: 07/04/1956, 61 y.o.   MRN: [id number]  Chief Complaint  Patient presents with  . New Patient (Initial Visit)    Claudication    HPI Kristina Mccoy is a 61 y.o. female.  I am asked to see the patient by Dr. Caryl Bis for evaluation of leg pain.  The patient reports aching and heaviness in her legs most all the time.  This has been going on now for months to years and has been steadily getting worse. She has previously had some studies including a CT scan which I have reviewed from earlier this year which demonstrates some mild aortic atherosclerosis. She does not describe typical claudication symptoms but does say activity seems to worsen her pain. There is not a lot of associated swelling. The pain is difficult to describe and she says basically feels deep and like her legs are just burning and aching. She reports no evidence of ulceration or infection. She denies fever or chills. Nothing seems to make the pain better. Pain medicine takes the edge off but does not really do a lot either. She had a vascular workup some years ago and was told things were okay at that time.   Past Medical History:  Diagnosis Date  . Abdominal pain, epigastric   . Allergic urticaria   . Blood clotting disorder ([place])   . Breast cancer ([place]) 5/14   left breast  . Breast cancer ([place]) 02/2013   Dr Laurance Flatten ([place])  . Diabetes mellitus   . Diverticulosis   . Fibromyalgia   . GERD (gastroesophageal reflux disease)   . Heart murmur   . Hyperlipidemia   . Lymphedema of arm    right  . Myalgia   . Myositis   . Neuromyositis   . Otitis media, chronic   . Pain syndrome, chronic   . Peptic ulcer   . Sicca syndrome ([place])   . Sjogren's disease ([place])   . Sleep apnea   . SOB (shortness of breath)   . Systemic involvement of connective tissue [facility])     Past Surgical History:  Procedure Laterality Date  . BREAST SURGERY Bilateral    mastectomy  .  COLONOSCOPY WITH PROPOFOL N/A 04/22/2017   Procedure: COLONOSCOPY WITH PROPOFOL;  Surgeon: Lollie Sails, MD;  [facility]COPY;  Service: Endoscopy;  Laterality: N/A;  . ESOPHAGOGASTRODUODENOSCOPY (EGD) WITH PROPOFOL N/A 04/22/2017   Procedure: ESOPHAGOGASTRODUODENOSCOPY (EGD) WITH PROPOFOL;  Surgeon: Lollie Sails, MD;  Location: [facility] ENDOSCOPY;  Service: Endoscopy;  Laterality: N/A;  . MASTECTOMY Bilateral 03/23/13    Family History  Problem Relation Age of Onset  . Diabetes Mother   . Heart disease Mother   . Hyperlipidemia Mother   . Heart failure Mother   . Diabetes Other   . Heart disease Brother   . Hyperlipidemia Brother   . Colon polyps Brother   . Stroke Maternal Grandmother   . Breast cancer Maternal Aunt   . Colon cancer Paternal Grandmother   . Ovarian cancer Neg Hx      Social History Social History  Substance Use Topics  . Smoking status: Former Smoker    Packs/day: 0.50    Types: Cigarettes    Quit date: 02/28/2013  . Smokeless tobacco: Never Used  . Alcohol use No  No IV drug use  Allergies  Allergen Reactions  . Gabapentin Itching and Swelling  . Oxycodone Itching and Rash  . Carbamazepine Nausea And Vomiting and  Other (See Comments)    Abdominal pain   . Insulin Glargine Other (See Comments)    BLOATING, MUSCLE/JOINT PAIN  . Methylprednisolone     Reddened face, puffy face   . Morphine Other (See Comments)  . Pregabalin Swelling    Swelling in arms and legs  . Venlafaxine Other (See Comments)  . Buprenorphine Rash and Swelling  . Canagliflozin Other (See Comments) and Rash    YEAST INFECTIONS  . Hydroxychloroquine Rash    Current Outpatient Prescriptions  Medication Sig Dispense Refill  . acetaminophen (TYLENOL) 325 MG tablet Take 650 mg by mouth.    Marland Kitchen albuterol (PROVENTIL HFA;VENTOLIN HFA) 108 (90 Base) MCG/ACT inhaler Inhale 2 puffs into the lungs every 6 (six) hours as needed for wheezing or shortness of breath. 1 Inhaler  0  . ALPRAZolam (XANAX) 0.5 MG tablet TAKE 1 TABLET BY MOUTH 4 TIMES A DAY AS NEEDED FOR ANXIETY 90 tablet 1  . Biotin 1 MG CAPS Take by mouth.    . cetirizine (ZYRTEC) 10 MG tablet Take by mouth.    . Cholecalciferol (VITAMIN D3) 1000 units CAPS Take by mouth.    . clobetasol ointment (TEMOVATE) 0.05 % Apply topically.    . Cyanocobalamin 1000 MCG CAPS Take by mouth.    . cyclobenzaprine (FLEXERIL) 10 MG tablet Take 1 tablet (10 mg total) by mouth 3 (three) times daily as needed for muscle spasms. 90 tablet 2  . dicyclomine (BENTYL) 10 MG capsule Take 1 capsule (10 mg total) by mouth 3 (three) times daily as needed for spasms. 60 capsule 1  . Dulaglutide 1.5 MG/0.5ML SOPN Inject into the skin.    . Emollient (CERAVE) CREA Apply topically.    Marland Kitchen esomeprazole (NEXIUM) 40 MG capsule Take 40 mg by mouth daily at 12 noon.    . fexofenadine-pseudoephedrine (ALLEGRA-D 24) 180-240 MG 24 hr tablet Take 1 tablet by mouth daily.    Marland Kitchen FREESTYLE LITE test strip CHECK 6 TIMES DAILY 100 each 3  . glipiZIDE (GLUCOTROL) 10 MG tablet Take 1 tablet (10 mg total) by mouth 2 (two) times daily before a meal. 60 tablet 3  . glucose blood (ONE TOUCH ULTRA TEST) test strip CHECK SIX TIMES DAILY 100 each 11  . hydroxychloroquine (PLAQUENIL) 200 MG tablet Take by mouth.    . hydrOXYzine (ATARAX/VISTARIL) 10 MG tablet Take by mouth.    . hyoscyamine (LEVSIN, ANASPAZ) 0.125 MG tablet TAKE ONE TABLET BY MOUTH EVERY 4 HOURS AS NEEDED 90 tablet 3  . insulin NPH Human (HUMULIN N,NOVOLIN N) 100 UNIT/ML injection Inject 0.05 mLs (5 Units total) into the skin 2 (two) times daily before a meal. 10 mL 11  . Insulin Syringe-Needle U-100 (INSULIN SYRINGE .5CC/31GX5/16") 31G X 5/16" 0.5 ML MISC Use twice daily with injections    . Lancets MISC Use. BAYER CONTOUR LANCETS    . metFORMIN (GLUCOPHAGE-XR) 500 MG 24 hr tablet Take 500 mg by mouth daily with breakfast.    . ONETOUCH DELICA LANCETS 86P MISC TWICE DAILY 100 each 3  .  polyethylene glycol powder (GLYCOLAX/MIRALAX) powder AS DIRECTED FOR COLONIC PREP.    Marland Kitchen prochlorperazine (COMPAZINE) 10 MG tablet Take 10 mg by mouth every 6 (six) hours as needed.    . RABEprazole (ACIPHEX) 20 MG tablet Take 20 mg by mouth daily.    . ranitidine (ZANTAC) 150 MG capsule Take 150 mg by mouth daily.    . tamoxifen (NOLVADEX) 20 MG tablet Take 20 mg by mouth daily.    Marland Kitchen  tiZANidine (ZANAFLEX) 2 MG tablet TAKE ONE TABLET BY MOUTH EVERY EIGHT HOURS AS NEEDED (HEADACHES) 60 tablet 1  . traMADol (ULTRAM) 50 MG tablet Take 1 tablet (50 mg total) by mouth every 8 (eight) hours as needed. 30 tablet 0   No current facility-administered medications for this visit.       REVIEW OF SYSTEMS (Negative unless checked)  Constitutional: [] Weight loss  [] Fever  [] Chills Cardiac: [] Chest pain   [] Chest pressure   [] Palpitations   [] Shortness of breath when laying flat   [] Shortness of breath at rest   [] Shortness of breath with exertion. Vascular:  [x] Pain in legs with walking   [x] Pain in legs at rest   [] Pain in legs when laying flat   [] Claudication   [] Pain in feet when walking  [] Pain in feet at rest  [] Pain in feet when laying flat   [] History of DVT   [] Phlebitis   [] Swelling in legs   [] Varicose veins   [] Non-healing ulcers Pulmonary:   [] Uses home oxygen   [] Productive cough   [] Hemoptysis   [] Wheeze  [] COPD   [] Asthma Neurologic:  [] Dizziness  [] Blackouts   [] Seizures   [] History of stroke   [] History of TIA  [] Aphasia   [] Temporary blindness   [] Dysphagia   [] Weakness or numbness in arms   [] Weakness or numbness in legs Musculoskeletal:  [] Arthritis   [] Joint swelling   [] Joint pain   [] Low back pain Hematologic:  [] Easy bruising  [] Easy bleeding   [x] Hypercoagulable state   [] Anemic  [] Hepatitis Gastrointestinal:  [] Blood in stool   [] Vomiting blood  [x] Gastroesophageal reflux/heartburn   [x] Abdominal pain Genitourinary:  [] Chronic kidney disease   [] Difficult urination  [] Frequent  urination  [] Burning with urination   [] Hematuria Skin:  [] Rashes   [] Ulcers   [] Wounds Psychological:  [] History of anxiety   []  History of major depression.    Physical Exam BP 115/67 (BP Location: Left Arm)   Pulse 73   Resp 17   Ht 5\' 4"  (1.626 m)   Wt 83 kg (183 lb)   BMI 31.41 kg/m  Gen:  WD/WN, NAD. Appears older than stated age. Head: /AT, No temporalis wasting. Ear/Nose/Throat: Hearing grossly intact, nares w/o erythema or drainage, oropharynx w/o Erythema/Exudate Eyes: Conjunctiva clear, sclera non-icteric  Neck: trachea midline.  No JVD.  Pulmonary:  Good air movement, respirations not labored, no use of accessory muscles Cardiac: RRR, normal S1, S2, no Murmurs, rubs or gallops. Vascular:  Vessel Right Left  Radial Palpable Palpable                          PT 1+ Palpable 1+ Palpable  DP Palpable Palpable   Gastrointestinal: soft, non-tender/non-distended.  Musculoskeletal: M/S 5/5 throughout.  Extremities without ischemic changes.  No deformity or atrophy. Trace LE edema. Neurologic: Sensation grossly intact in extremities.  Symmetrical.  Speech is fluent. Motor exam as listed above. Psychiatric: Judgment intact, Mood & affect appropriate for pt's clinical situation. Dermatologic: No rashes or ulcers noted.  No cellulitis or open wounds.    Radiology No results found.  Labs Recent Results (from the past 2160 hour(s))  Lipid panel     Status: Abnormal   Collection Time: 03/07/17  9:46 AM  Result Value Ref Range   Cholesterol 186 0 - 200 mg/dL    Comment: ATP III Classification       Desirable:  < 200 mg/dL  Borderline High:  200 - 239 mg/dL          High:  > = 240 mg/dL   Triglycerides 161.0 (H) 0.0 - 149.0 mg/dL    Comment: Normal:  <150 mg/dLBorderline High:  150 - 199 mg/dL   HDL 40.10 >39.00 mg/dL   VLDL 32.2 0.0 - 40.0 mg/dL   LDL Cholesterol 114 (H) 0 - 99 mg/dL   Total CHOL/HDL Ratio 5     Comment:                Men           Women1/2 Average Risk     3.4          3.3Average Risk          5.0          4.42X Average Risk          9.6          7.13X Average Risk          15.0          11.0                       NonHDL 145.93     Comment: NOTE:  Non-HDL goal should be 30 mg/dL higher than patient's LDL goal (i.e. LDL goal of < 70 mg/dL, would have non-HDL goal of < 100 mg/dL)  CBC     Status: Abnormal   Collection Time: 04/22/17  7:29 AM  Result Value Ref Range   WBC 11.8 (H) 3.6 - 11.0 K/uL   RBC 4.79 3.80 - 5.20 MIL/uL   Hemoglobin 14.4 12.0 - 16.0 g/dL   HCT 42.6 35.0 - 47.0 %   MCV 88.8 80.0 - 100.0 fL   MCH 30.0 26.0 - 34.0 pg   MCHC 33.8 32.0 - 36.0 g/dL   RDW 14.3 11.5 - 14.5 %   Platelets 193 150 - 440 K/uL  Protime-INR     Status: None   Collection Time: 04/22/17  7:29 AM  Result Value Ref Range   Prothrombin Time 13.2 11.4 - 15.2 seconds   INR 1.00   Glucose, capillary     Status: Abnormal   Collection Time: 04/22/17  7:50 AM  Result Value Ref Range   Glucose-Capillary 157 (H) 65 - 99 mg/dL   Comment 1 Notify RN    Comment 2 Document in Chart   Surgical pathology     Status: None   Collection Time: 04/22/17  8:28 AM  Result Value Ref Range   SURGICAL PATHOLOGY      Surgical Pathology CASE: [id number] PATIENT: Kristina Mccoy Surgical Pathology Report     SPECIMEN SUBMITTED: A. Duodenum; cbx B. Stomach, antrum; cbx C. Stomach, body; cbx D. GEJ; cbx E. Ileocecal valve polyp; cbx  CLINICAL HISTORY: None provided  PRE-OPERATIVE DIAGNOSIS: Dysphagia, FH polyps  POST-OPERATIVE DIAGNOSIS: Gastritis, mild duodenitis, irregular GEJ, diverticulosis, polyp     DIAGNOSIS: A. DUODENUM; COLD BIOPSY: - DUODENAL MUCOSA WITH BRUNNER'S GLAND HYPERPLASIA. - NEGATIVE FOR INTRA-EPITHELIAL LYMPHOCYTOSIS, DYSPLASIA AND MALIGNANCY.  B.  STOMACH, ANTRUM; COLD BIOPSY: - ANTRAL MUCOSA WITH MINIMAL CHRONIC GASTRITIS. - NEGATIVE FOR H. PYLORI, DYSPLASIA AND MALIGNANCY.  C.  STOMACH, BODY;  COLD BIOPSY: - OXYNTIC MUCOSA WITH MINIMAL CHRONIC GASTRITIS AND PROTON PUMP INHIBITOR EFFECT. - NEGATIVE FOR H. PYLORI, DYSPLASIA, AND MALIGNANCY.  D.  GE JUNCTION; COLD BIOPSY: - REFLUX GASTROESOPHAGITIS. - NEGATIVE FOR GOBLET CELLS, DYSPLASI A AND  MALIGNANCY.  E.  ILEOCECAL VALVE POLYP; COLD BIOPSY: - PROMINENT LYMPHOID AGGREGATE.   GROSS DESCRIPTION:  A. Labeled: C BX duodenum  Tissue fragment(s): 1  Size: 0.3 cm  Description: tan fragment  Entirely submitted in one cassette(s).  B. Labeled: C BX antrum stomach  Tissue fragment(s): 2  Size: 0.3 and 0.5 cm  Description: pink fragments  Entirely submitted in 1 cassette(s).  C. Labeled: C BX stomach body  Tissue fragment(s): 2  Size: 0.1 and 0.6 cm  Description: tan fragments  Entirely submitted in 1 cassette(s).  D. Labeled: C BX GEJ  Tissue fragment(s): 2  Size: 0.2 and 0.3 cm  Description: pink fragments  Entirely submitted in 1 cassette(s).  E. Labeled: C BX ileocecal valve polyp  Tissue fragment(s): 1  Size: 0.1 cm  Description: tan fragment  Entirely submitted in 1 cassette(s).    Final Diagnosis performed by Delorse Lek, MD.  Electronically signed 04/23/2017 3:16:24PM    The electronic signature indicates  that the named Attending Pathologist has evaluated the specimen  Technical component performed at [facility], [address] [place], [place] [address] Lab: [phone] Dir: Darrick Penna. Evette Doffing, MD  Professional component performed at [facility], [facility], [place], [place], [place] [address] Lab: [phone] Dir: Dellia Nims. Rubinas, MD      Assessment/Plan:  Diabetes type 2, uncontrolled ([place]) blood glucose control important in reducing the progression of atherosclerotic disease. Also, involved in wound healing. On appropriate medications.   Blood clotting disorder [facility]) Makes concern for vascular disease higher.  Pain in limb   Recommend:  The patient has atypical pain symptoms for pure atherosclerotic disease. However, on physical exam there is evidence of mixed venous and arterial disease, given the diminished pulses and the edema associated with venous changes of the legs.  Noninvasive studies including ABI's and venous ultrasound of the legs will be obtained and the patient will follow up with me to review these studies.  The patient should continue walking and begin a more formal exercise program. The patient should continue his antiplatelet therapy and aggressive treatment of the lipid abnormalities.  The patient should begin wearing graduated compression socks 15-20 mmHg strength to control edema.       Leotis Pain 06/03/2017, 5:02 PM   This note was created with Dragon medical transcription system.  Any errors from dictation are unintentional.

## 2017-06-03 NOTE — Patient Instructions (Signed)

## 2017-06-03 NOTE — Assessment & Plan Note (Signed)

## 2017-06-09 ENCOUNTER — Other Ambulatory Visit: Payer: Self-pay | Admitting: Family Medicine

## 2017-06-09 NOTE — Telephone Encounter (Signed)
Last OV was7/25/2018, last refill was 05/09/2017 #30 with no refills, please advise, thanks

## 2017-06-09 NOTE — Telephone Encounter (Signed)
Please make sure the patient has been taking this with no adverse effects. Then I'll consider refilling. Thanks.

## 2017-06-10 NOTE — Telephone Encounter (Signed)
Spoke with the patient, she has had no adverse effects/events with the tramadol. Please advise for refill, thanks

## 2017-06-17 ENCOUNTER — Encounter: Payer: Self-pay | Admitting: Family Medicine

## 2017-06-17 DIAGNOSIS — R1013 Epigastric pain: Secondary | ICD-10-CM | POA: Diagnosis not present

## 2017-06-19 ENCOUNTER — Other Ambulatory Visit: Payer: Self-pay | Admitting: Family Medicine

## 2017-06-19 ENCOUNTER — Telehealth: Payer: Self-pay | Admitting: Family Medicine

## 2017-06-19 NOTE — Telephone Encounter (Signed)
Pt dropped off disability paper to be completed by Dr. Caryl Bis. Papers are up front in Dr. Ellen Henri color folder.

## 2017-06-19 NOTE — Telephone Encounter (Signed)
Last OV 04/23/2017 Next OV 07/25/2017 Last refill 04/23/2017

## 2017-06-19 NOTE — Telephone Encounter (Signed)
In red folder. 

## 2017-06-23 DIAGNOSIS — Z170 Estrogen receptor positive status [ER+]: Secondary | ICD-10-CM | POA: Diagnosis not present

## 2017-06-23 DIAGNOSIS — R29898 Other symptoms and signs involving the musculoskeletal system: Secondary | ICD-10-CM | POA: Diagnosis not present

## 2017-06-23 DIAGNOSIS — C50411 Malignant neoplasm of upper-outer quadrant of right female breast: Secondary | ICD-10-CM | POA: Diagnosis not present

## 2017-06-23 DIAGNOSIS — Z79810 Long term (current) use of selective estrogen receptor modulators (SERMs): Secondary | ICD-10-CM | POA: Diagnosis not present

## 2017-06-23 DIAGNOSIS — Z5181 Encounter for therapeutic drug level monitoring: Secondary | ICD-10-CM | POA: Diagnosis not present

## 2017-06-26 NOTE — Telephone Encounter (Signed)
Completed. Please fill in our information and make available to patient.

## 2017-06-27 NOTE — Telephone Encounter (Signed)
Patient came in and picked up form

## 2017-06-27 NOTE — Telephone Encounter (Signed)
Pt has requested to know if this was ready got pick up.  Please cal pt when ready  Pt contact [phone]

## 2017-06-30 NOTE — Telephone Encounter (Signed)
Dr.Sonnenberg filled out question, informed patient new form is ready to be picked up

## 2017-06-30 NOTE — Telephone Encounter (Signed)
Pt called about a form that was filled out there was one question that was not answered. Pt wants to know if she can drop off form? Please advise?  Call pt @ [phone]. Thank you!

## 2017-07-01 DIAGNOSIS — E1165 Type 2 diabetes mellitus with hyperglycemia: Secondary | ICD-10-CM | POA: Diagnosis not present

## 2017-07-01 DIAGNOSIS — E1129 Type 2 diabetes mellitus with other diabetic kidney complication: Secondary | ICD-10-CM | POA: Diagnosis not present

## 2017-07-07 ENCOUNTER — Ambulatory Visit (INDEPENDENT_AMBULATORY_CARE_PROVIDER_SITE_OTHER): Payer: PPO

## 2017-07-07 ENCOUNTER — Ambulatory Visit (INDEPENDENT_AMBULATORY_CARE_PROVIDER_SITE_OTHER): Payer: PPO | Admitting: Vascular Surgery

## 2017-07-07 ENCOUNTER — Encounter (INDEPENDENT_AMBULATORY_CARE_PROVIDER_SITE_OTHER): Payer: Self-pay | Admitting: Vascular Surgery

## 2017-07-07 VITALS — BP 115/76 | HR 82 | Resp 16 | Ht 64.0 in | Wt 184.0 lb

## 2017-07-07 DIAGNOSIS — M79605 Pain in left leg: Secondary | ICD-10-CM

## 2017-07-07 DIAGNOSIS — M79604 Pain in right leg: Secondary | ICD-10-CM

## 2017-07-07 DIAGNOSIS — E1165 Type 2 diabetes mellitus with hyperglycemia: Secondary | ICD-10-CM | POA: Diagnosis not present

## 2017-07-07 DIAGNOSIS — E785 Hyperlipidemia, unspecified: Secondary | ICD-10-CM | POA: Diagnosis not present

## 2017-07-07 NOTE — Progress Notes (Signed)
Subjective:    Patient ID: Kristina Mccoy, female    DOB: 08/09/1956, 61 y.o.   MRN: [id number] Chief Complaint  Patient presents with  . Follow-up    Ultrasound f/u   Patient presents to review vascular studies. She was last seen on 06/03/2017, planing of pain to the lower extremity. Patient's symptoms are stable. She continues to deny any rest pain or ulceration to the lower extremity. She denies any edema to the lower extremity. Patient believes her discomfort is stemming from an arthritic The patient underwent a bilateral ABI which was notable for triphasic bilateral tibials. The bilateral ankle brachial indices and Doppler waveforms suggest no significant bilateral lower extremity arterial disease. The patient also underwent a bilateral venous duplex exam, which was notable for no venous reflux, no DVT, or SVT bilaterally. The patient denies any fever, nausea or vomiting.   Review of Systems  Constitutional: Negative.   HENT: Negative.   Eyes: Negative.   Respiratory: Negative.   Cardiovascular:       Lower extremity pain  Gastrointestinal: Negative.   Endocrine: Negative.   Genitourinary: Negative.   Musculoskeletal: Negative.   Skin: Negative.   Allergic/Immunologic: Negative.   Neurological: Negative.   Hematological: Negative.   Psychiatric/Behavioral: Negative.       Objective:   Physical Exam  Constitutional: She is oriented to person, place, and time. She appears well-developed and well-nourished. No distress.  HENT:  Head: Normocephalic and atraumatic.  Eyes: Pupils are equal, round, and reactive to light. Conjunctivae are normal.  Neck: Normal range of motion.  Cardiovascular: Normal rate, regular rhythm, normal heart sounds and intact distal pulses.   Pulses:      Radial pulses are 2+ on the right side, and 2+ on the left side.       Dorsalis pedis pulses are 2+ on the right side, and 2+ on the left side.       Posterior tibial pulses are 1+ on the right  side, and 1+ on the left side.  Pulmonary/Chest: Effort normal.  Musculoskeletal: Normal range of motion. She exhibits no edema.  Neurological: She is alert and oriented to person, place, and time.  Skin: Skin is warm and dry. She is not diaphoretic.  Psychiatric: She has a normal mood and affect. Her behavior is normal. Judgment and thought content normal.  Vitals reviewed.  BP 115/76 (BP Location: Right Arm)   Pulse 82   Resp 16   Ht 5\' 4"  (1.626 m)   Wt 184 lb (83.5 kg)   BMI 31.58 kg/m   Past Medical History:  Diagnosis Date  . Abdominal pain, epigastric   . Allergic urticaria   . Blood clotting disorder ([place])   . Breast cancer ([place]) 5/14   left breast  . Breast cancer ([place]) 02/2013   Dr Laurance Flatten ([place])  . Diabetes mellitus   . Diverticulosis   . Fibromyalgia   . GERD (gastroesophageal reflux disease)   . Heart murmur   . Hyperlipidemia   . Lymphedema of arm    right  . Myalgia   . Myositis   . Neuromyositis   . Otitis media, chronic   . Pain syndrome, chronic   . Peptic ulcer   . Sicca syndrome ([place])   . Sjogren's disease ([place])   . Sleep apnea   . SOB (shortness of breath)   . Systemic involvement of connective tissue [facility])    Social History   Social History  . Marital status: Single  Spouse name: N/A  . Number of children: N/A  . Years of education: N/A   Occupational History  . disabled    Social History Main Topics  . Smoking status: Former Smoker    Packs/day: 0.50    Types: Cigarettes    Quit date: 02/28/2013  . Smokeless tobacco: Never Used  . Alcohol use No  . Drug use: No  . Sexual activity: No   Other Topics Concern  . Not on file   Social History Narrative  . No narrative on file   Past Surgical History:  Procedure Laterality Date  . BREAST SURGERY Bilateral    mastectomy  . COLONOSCOPY WITH PROPOFOL N/A 04/22/2017   Procedure: COLONOSCOPY WITH PROPOFOL;  Surgeon: Lollie Sails, MD;  Location: [facility] ENDOSCOPY;  Service:  Endoscopy;  Laterality: N/A;  . ESOPHAGOGASTRODUODENOSCOPY (EGD) WITH PROPOFOL N/A 04/22/2017   Procedure: ESOPHAGOGASTRODUODENOSCOPY (EGD) WITH PROPOFOL;  Surgeon: Lollie Sails, MD;  Location: [facility] ENDOSCOPY;  Service: Endoscopy;  Laterality: N/A;  . MASTECTOMY Bilateral 03/23/13   Family History  Problem Relation Age of Onset  . Diabetes Mother   . Heart disease Mother   . Hyperlipidemia Mother   . Heart failure Mother   . Diabetes Other   . Heart disease Brother   . Hyperlipidemia Brother   . Colon polyps Brother   . Stroke Maternal Grandmother   . Breast cancer Maternal Aunt   . Colon cancer Paternal Grandmother   . Ovarian cancer Neg Hx    Allergies  Allergen Reactions  . Gabapentin Itching and Swelling  . Oxycodone Itching and Rash  . Carbamazepine Nausea And Vomiting and Other (See Comments)    Abdominal pain   . Insulin Glargine Other (See Comments)    BLOATING, MUSCLE/JOINT PAIN  . Methylprednisolone     Reddened face, puffy face   . Morphine Other (See Comments)  . Pregabalin Swelling    Swelling in arms and legs  . Venlafaxine Other (See Comments)  . Buprenorphine Rash and Swelling  . Canagliflozin Other (See Comments) and Rash    YEAST INFECTIONS  . Hydroxychloroquine Rash      Assessment & Plan:  Patient presents to review vascular studies. She was last seen on 06/03/2017, planing of pain to the lower extremity. Patient's symptoms are stable. She continues to deny any rest pain or ulceration to the lower extremity. She denies any edema to the lower extremity. Patient believes her discomfort is stemming from an arthritic The patient underwent a bilateral ABI which was notable for triphasic bilateral tibials. The bilateral ankle brachial indices and Doppler waveforms suggest no significant bilateral lower extremity arterial disease. The patient also underwent a bilateral venous duplex exam, which was notable for no venous reflux, no DVT, or SVT bilaterally.  The patient denies any fever, nausea or vomiting.  1. Hyperlipidemia, unspecified hyperlipidemia type - Stable Encouraged good control as its slows the progression of atherosclerotic disease  2. Pain in both lower extremities - Stable Patient with continued bilateral lower extremity discomfort. I do not feel her discomfort is stemming from a arterial or vascular origin. ABI without any significant peripheral artery disease Venous duplex without venous reflux Patient encouraged to follow-up with her primary care physician to continue workup for lower extremity discomfort. Patient to follow up PRN Patient is in agreement and understands  3. Uncontrolled type 2 diabetes mellitus with hyperglycemia (HCC) - Stable Encouraged good control as its slows the progression of atherosclerotic disease  Current Outpatient Prescriptions  on File Prior to Visit  Medication Sig Dispense Refill  . acetaminophen (TYLENOL) 325 MG tablet Take 650 mg by mouth.    Marland Kitchen albuterol (PROVENTIL HFA;VENTOLIN HFA) 108 (90 Base) MCG/ACT inhaler Inhale 2 puffs into the lungs every 6 (six) hours as needed for wheezing or shortness of breath. 1 Inhaler 0  . ALPRAZolam (XANAX) 0.5 MG tablet TAKE ONE TABLET BY MOUTH FOUR TIMES A DAY AS NEEDED FOR ANXIETY 90 tablet 1  . Biotin 1 MG CAPS Take by mouth.    . cetirizine (ZYRTEC) 10 MG tablet Take by mouth.    . Cholecalciferol (VITAMIN D3) 1000 units CAPS Take by mouth.    . clobetasol ointment (TEMOVATE) 0.05 % Apply topically.    . Cyanocobalamin 1000 MCG CAPS Take by mouth.    . cyclobenzaprine (FLEXERIL) 10 MG tablet Take 1 tablet (10 mg total) by mouth 3 (three) times daily as needed for muscle spasms. 90 tablet 2  . dicyclomine (BENTYL) 10 MG capsule Take 1 capsule (10 mg total) by mouth 3 (three) times daily as needed for spasms. 60 capsule 1  . Dulaglutide 1.5 MG/0.5ML SOPN Inject into the skin.    . Emollient (CERAVE) CREA Apply topically.    Marland Kitchen esomeprazole (NEXIUM) 40  MG capsule Take 40 mg by mouth daily at 12 noon.    . fexofenadine-pseudoephedrine (ALLEGRA-D 24) 180-240 MG 24 hr tablet Take 1 tablet by mouth daily.    Marland Kitchen FREESTYLE LITE test strip CHECK 6 TIMES DAILY 100 each 3  . glipiZIDE (GLUCOTROL) 10 MG tablet Take 1 tablet (10 mg total) by mouth 2 (two) times daily before a meal. 60 tablet 3  . glucose blood (ONE TOUCH ULTRA TEST) test strip CHECK SIX TIMES DAILY 100 each 11  . hydroxychloroquine (PLAQUENIL) 200 MG tablet Take by mouth.    . hydrOXYzine (ATARAX/VISTARIL) 10 MG tablet Take by mouth.    . hyoscyamine (LEVSIN, ANASPAZ) 0.125 MG tablet TAKE ONE TABLET BY MOUTH EVERY 4 HOURS AS NEEDED 90 tablet 3  . insulin NPH Human (HUMULIN N,NOVOLIN N) 100 UNIT/ML injection Inject 0.05 mLs (5 Units total) into the skin 2 (two) times daily before a meal. 10 mL 11  . Insulin Syringe-Needle U-100 (INSULIN SYRINGE .5CC/31GX5/16") 31G X 5/16" 0.5 ML MISC Use twice daily with injections    . Lancets MISC Use. BAYER CONTOUR LANCETS    . metFORMIN (GLUCOPHAGE-XR) 500 MG 24 hr tablet Take 500 mg by mouth daily with breakfast.    . ONETOUCH DELICA LANCETS 73X MISC TWICE DAILY 100 each 3  . polyethylene glycol powder (GLYCOLAX/MIRALAX) powder AS DIRECTED FOR COLONIC PREP.    Marland Kitchen prochlorperazine (COMPAZINE) 10 MG tablet Take 10 mg by mouth every 6 (six) hours as needed.    . RABEprazole (ACIPHEX) 20 MG tablet Take 20 mg by mouth daily.    . ranitidine (ZANTAC) 150 MG capsule Take 150 mg by mouth daily.    . tamoxifen (NOLVADEX) 20 MG tablet Take 20 mg by mouth daily.    Marland Kitchen tiZANidine (ZANAFLEX) 2 MG tablet TAKE ONE TABLET BY MOUTH EVERY EIGHT HOURS AS NEEDED (HEADACHES) 60 tablet 1  . traMADol (ULTRAM) 50 MG tablet TAKE 1 TABLET BY MOUTH EVERY 8 HOURS AS NEEDED FOR PAIN 30 tablet 0   No current facility-administered medications on file prior to visit.    There are no Patient Instructions on file for this visit. No Follow-up on file.  Rea Kalama A Valton Schwartz,  PA-C

## 2017-07-08 DIAGNOSIS — Z794 Long term (current) use of insulin: Secondary | ICD-10-CM | POA: Diagnosis not present

## 2017-07-08 DIAGNOSIS — E1165 Type 2 diabetes mellitus with hyperglycemia: Secondary | ICD-10-CM | POA: Diagnosis not present

## 2017-07-10 ENCOUNTER — Other Ambulatory Visit: Payer: Self-pay | Admitting: Family Medicine

## 2017-07-10 DIAGNOSIS — K2970 Gastritis, unspecified, without bleeding: Secondary | ICD-10-CM | POA: Diagnosis not present

## 2017-07-10 DIAGNOSIS — R945 Abnormal results of liver function studies: Secondary | ICD-10-CM | POA: Diagnosis not present

## 2017-07-11 ENCOUNTER — Other Ambulatory Visit: Payer: Self-pay | Admitting: Family Medicine

## 2017-07-11 DIAGNOSIS — J029 Acute pharyngitis, unspecified: Secondary | ICD-10-CM | POA: Diagnosis not present

## 2017-07-11 NOTE — Telephone Encounter (Signed)
Please advise for refill, last OV was 04/23/17, Last refill was 9/11 for #30 with no refills

## 2017-07-25 ENCOUNTER — Encounter: Payer: Self-pay | Admitting: Family Medicine

## 2017-07-25 ENCOUNTER — Ambulatory Visit (INDEPENDENT_AMBULATORY_CARE_PROVIDER_SITE_OTHER): Payer: PPO | Admitting: Family Medicine

## 2017-07-25 VITALS — BP 124/70 | HR 88 | Temp 97.5°F | Wt 185.0 lb

## 2017-07-25 DIAGNOSIS — G8929 Other chronic pain: Secondary | ICD-10-CM

## 2017-07-25 DIAGNOSIS — G894 Chronic pain syndrome: Secondary | ICD-10-CM | POA: Diagnosis not present

## 2017-07-25 DIAGNOSIS — M3500 Sicca syndrome, unspecified: Secondary | ICD-10-CM

## 2017-07-25 DIAGNOSIS — J329 Chronic sinusitis, unspecified: Secondary | ICD-10-CM | POA: Insufficient documentation

## 2017-07-25 DIAGNOSIS — G4733 Obstructive sleep apnea (adult) (pediatric): Secondary | ICD-10-CM | POA: Diagnosis not present

## 2017-07-25 DIAGNOSIS — J0100 Acute maxillary sinusitis, unspecified: Secondary | ICD-10-CM

## 2017-07-25 DIAGNOSIS — Z9989 Dependence on other enabling machines and devices: Secondary | ICD-10-CM | POA: Diagnosis not present

## 2017-07-25 DIAGNOSIS — M545 Low back pain: Secondary | ICD-10-CM | POA: Diagnosis not present

## 2017-07-25 DIAGNOSIS — F419 Anxiety disorder, unspecified: Secondary | ICD-10-CM | POA: Diagnosis not present

## 2017-07-25 MED ORDER — ALPRAZOLAM 0.5 MG PO TABS: ORAL_TABLET | ORAL | 1 refills | Status: DC

## 2017-07-25 MED ORDER — DOXYCYCLINE HYCLATE 100 MG PO TABS: 100.0000 mg | ORAL_TABLET | Freq: Two times a day (BID) | ORAL | 0 refills | Status: DC

## 2017-07-25 MED ORDER — TRAMADOL HCL 50 MG PO TABS: 50.0000 mg | ORAL_TABLET | Freq: Four times a day (QID) | ORAL | 0 refills | Status: DC | PRN

## 2017-07-25 NOTE — Progress Notes (Signed)
[name], MD Phone: [phone]  Kristina Mccoy is a 61 y.o. female who presents today for follow-up.  Patient seen at the walk-in clinic for sinus infection. Treated with amoxicillin. Has had maxillary sinus congestion. Notes amoxicillin did help though the day after discontinuing it symptoms came back. Notes sore scratchy throat. Notes maxillary sinus congestion. Has been trying cetirizine with little benefit. Does note some chills though no fevers.  OSA: Currently on CPAP nightly. Wears for 5 hours. Notes it is helpful though with all her chronic issue she's never well rested.  Patient has several autoimmune issues and notes she is exhausted all the time. She has pain all over all the time. She never feels good. Tramadol is somewhat helpful for her pain though it does not last 8 hours. She is just frustrated with not having answer for how things are going with her. She has never received great answers from any of the specialists she is seen. She has been evaluated by multiple rheumatologists. She would be interested in a third opinion and possibly is seen orthopedics for her back.  Patient does note some anxiety and depression. Feels like she has trouble making a decision and it makes her anxious. She does take Xanax. She notes no SI.  PMH: Former smoker   ROS see history of present illness  Objective  Physical Exam Vitals:   07/25/17 1407  BP: 124/70  Pulse: 88  Temp: (!) 97.5 F (36.4 C)  SpO2: 96%    BP Readings from Last 3 Encounters:  07/25/17 124/70  07/07/17 115/76  06/03/17 115/67   Wt Readings from Last 3 Encounters:  07/25/17 185 lb (83.9 kg)  07/07/17 184 lb (83.5 kg)  06/03/17 183 lb (83 kg)    Physical Exam  Constitutional: No distress.  HENT:  Head: Normocephalic and atraumatic.  Mouth/Throat: Oropharynx is clear and moist. No oropharyngeal exudate.  Slight tenderness to percussion of maxillary sinuses bilaterally  Eyes: Pupils are equal,  round, and reactive to light. Conjunctivae are normal.  Cardiovascular: Normal rate, regular rhythm and normal heart sounds.   Pulmonary/Chest: Effort normal and breath sounds normal.  Neurological: She is alert. Gait normal.  Skin: Skin is warm and dry. She is not diaphoretic.     Assessment/Plan: Please see individual problem list.  Sjogren's syndrome ([place]) Patient with several autoimmune issues that could be contributing to her exhaustion and chronic pain. At this point she's not had any success in getting adequate answers for her. Refer to rheumatology [facility] for a third opinion.  Sinusitis Has had issues following treatment with amoxicillin. We'll cover with doxycycline. If not improving she'll let us know.  Anxiety Patient with fairly significant anxiety and depression. We'll refer to psychiatry at [facility] for evaluation. Refill provided of Xanax.  Chronic pain disorder Continues to have issues with pain. We'll refer to a new rheumatologist and also to orthopedics.  OSA on CPAP Stable on CPAP. Suspect tiredness is related more to chronic pain and fatigue from autoimmune issues.   Orders Placed This Encounter  Procedures  . Ambulatory referral to Rheumatology    Referral Priority:   Routine    Referral Type:   Consultation    Referral Reason:   Specialty Services Required    Requested Specialty:   Rheumatology    Number of Visits Requested:   1  . Ambulatory referral to Psychiatry    Referral Priority:   Routine    Referral Type:   Psychiatric    Referral  Reason:   Specialty Services Required    Requested Specialty:   Psychiatry    Number of Visits Requested:   1    Meds ordered this encounter  Medications  . DPH-Lido-AlHydr-MgHydr-Simeth (FIRST-MOUTHWASH BLM) SUSP    Sig: Swish and spit 10 mLs 3 (three) times daily.  . insulin aspart (NOVOLOG FLEXPEN) 100 UNIT/ML FlexPen    Sig: Take up to 35 units daily in divided dose as directed  . doxycycline (VIBRA-TABS) 100 MG  tablet    Sig: Take 1 tablet (100 mg total) by mouth 2 (two) times daily.    Dispense:  14 tablet    Refill:  0  . ALPRAZolam (XANAX) 0.5 MG tablet    Sig: TAKE ONE TABLET BY MOUTH FOUR TIMES A DAY AS NEEDED FOR ANXIETY    Dispense:  120 tablet    Refill:  1    Not to exceed 4 additional fills before 10/20/2017  . traMADol (ULTRAM) 50 MG tablet    Sig: Take 1 tablet (50 mg total) by mouth every 6 (six) hours as needed. for pain    Dispense:  120 tablet    Refill:  0    Not to exceed 5 additional fills before 12/07/2017.    [name], MD [place]

## 2017-07-25 NOTE — Assessment & Plan Note (Signed)
Continues to have issues with pain. We'll refer to a new rheumatologist and also to orthopedics.

## 2017-07-25 NOTE — Assessment & Plan Note (Signed)
Stable on CPAP. Suspect tiredness is related more to chronic pain and fatigue from autoimmune issues.

## 2017-07-25 NOTE — Assessment & Plan Note (Signed)
Patient with several autoimmune issues that could be contributing to her exhaustion and chronic pain. At this point she's not had any success in getting adequate answers for her. Refer to rheumatology [facility] for a third opinion.

## 2017-07-25 NOTE — Patient Instructions (Signed)
Nice to see you. Please take the doxycycline for your sinus infection. If it does not improve please let us know. We will refer you to rheumatology and psychiatry at [facility].

## 2017-07-25 NOTE — Assessment & Plan Note (Signed)
Has had issues following treatment with amoxicillin. We'll cover with doxycycline. If not improving she'll let us know.

## 2017-07-25 NOTE — Assessment & Plan Note (Addendum)
Patient with fairly significant anxiety and depression. We'll refer to psychiatry at [facility] for evaluation. Refill provided of Xanax.

## 2017-07-29 ENCOUNTER — Encounter: Payer: Self-pay | Admitting: Family Medicine

## 2017-07-30 ENCOUNTER — Other Ambulatory Visit: Payer: Self-pay | Admitting: Family Medicine

## 2017-07-30 MED ORDER — FLUCONAZOLE 150 MG PO TABS: 150.0000 mg | ORAL_TABLET | ORAL | 0 refills | Status: DC

## 2017-08-07 ENCOUNTER — Encounter: Payer: Self-pay | Admitting: Family Medicine

## 2017-08-08 ENCOUNTER — Telehealth: Payer: Self-pay | Admitting: Family Medicine

## 2017-08-08 NOTE — Telephone Encounter (Signed)
Copied from [place] [phone]. Topic: Quick Communication - See Telephone Encounter >> Aug 08, 2017  2:28 PM Burnis Medin, NT wrote: CRM for notification. See Telephone encounter for: Pt. Is calling in about still having the same problem of sore throat and mouth sores. Pt said she received a message by e mail about making an appointment per Dr. Josephina Gip but no availability. Pt has requested if she can try another antibiotic if she can't get appointment soon. Patient is not currently working so is flexible. Pt would prefer to be seen. Pt. Uses [facility] in  [place] on [address].  08/08/17.

## 2017-08-08 NOTE — Telephone Encounter (Signed)
Patient stated she is no better and would like to know if you would call in a different antibiotic since no appointments available. She staed had mention this a previous my chart message.

## 2017-08-08 NOTE — Telephone Encounter (Signed)
I do not believe another antibiotic is likely to be beneficial for sore throat and mouth sores.  She would need to be evaluated to determine if it is related to thrush or some other cause to determine the appropriate treatment.

## 2017-08-11 NOTE — Telephone Encounter (Signed)
Scheduled with NP for Wednesday 08/13/17

## 2017-08-13 ENCOUNTER — Ambulatory Visit: Payer: PPO | Admitting: Family Medicine

## 2017-08-13 ENCOUNTER — Encounter: Payer: Self-pay | Admitting: Family Medicine

## 2017-08-13 VITALS — BP 132/80 | HR 71 | Temp 97.8°F | Wt 191.2 lb

## 2017-08-13 DIAGNOSIS — J029 Acute pharyngitis, unspecified: Secondary | ICD-10-CM | POA: Diagnosis not present

## 2017-08-13 DIAGNOSIS — K1230 Oral mucositis (ulcerative), unspecified: Secondary | ICD-10-CM

## 2017-08-13 DIAGNOSIS — K1379 Other lesions of oral mucosa: Secondary | ICD-10-CM | POA: Diagnosis not present

## 2017-08-13 MED ORDER — NYSTATIN 100000 UNIT/ML MT SUSP: 5.0000 mL | Freq: Four times a day (QID) | OROMUCOSAL | 0 refills | Status: DC

## 2017-08-13 MED ORDER — FIRST-MOUTHWASH BLM MT SUSP: OROMUCOSAL | 0 refills | Status: DC

## 2017-08-13 NOTE — Patient Instructions (Signed)
It was a pleasure to see you today!  -use magic mouthwash for discomfort -use nystatin mouthwash to treat sores in mouth -continue all medications provided by your GI provider and avoid triggers for reflux -triggers for reflux in include caffeine, nicotine, aspirin, ibuprofen, chocolate, peppermint, and spicy foods.  If symptoms do not improve with treatment, please follow up with your provider or your ENT provider as discussed.  See information below regarding reflux.  Feel better soon!   Food Choices for Gastroesophageal Reflux Disease, Adult When you have gastroesophageal reflux disease (GERD), the foods you eat and your eating habits are very important. Choosing the right foods can help ease your discomfort. What guidelines do I need to follow?  Choose fruits, vegetables, whole grains, and low-fat dairy products.  Choose low-fat meat, fish, and poultry.  Limit fats such as oils, salad dressings, butter, nuts, and avocado.  Keep a food diary. This helps you identify foods that cause symptoms.  Avoid foods that cause symptoms. These may be different for everyone.  Eat small meals often instead of 3 large meals a day.  Eat your meals slowly, in a place where you are relaxed.  Limit fried foods.  Cook foods using methods other than frying.  Avoid drinking alcohol.  Avoid drinking large amounts of liquids with your meals.  Avoid bending over or lying down until 2-3 hours after eating. What foods are not recommended? These are some foods and drinks that may make your symptoms worse: Vegetables Tomatoes. Tomato juice. Tomato and spaghetti sauce. Chili peppers. Onion and garlic. Horseradish. Fruits Oranges, grapefruit, and lemon (fruit and juice). Meats High-fat meats, fish, and poultry. This includes hot dogs, ribs, ham, sausage, salami, and bacon. Dairy Whole milk and chocolate milk. Sour cream. Cream. Butter. Ice cream. Cream cheese. Drinks Coffee and tea. Bubbly  (carbonated) drinks or energy drinks. Condiments Hot sauce. Barbecue sauce. Sweets/Desserts Chocolate and cocoa. Donuts. Peppermint and spearmint. Fats and Oils High-fat foods. This includes [place] fries and potato chips. Other Vinegar. Strong spices. This includes black pepper, white pepper, red pepper, cayenne, curry powder, cloves, ginger, and chili powder. The items listed above may not be a complete list of foods and drinks to avoid. Contact your dietitian for more information. This information is not intended to replace advice given to you by your health care provider. Make sure you discuss any questions you have with your health care provider. Document Released: 03/17/2012 Document Revised: 02/22/2016 Document Reviewed: 07/21/2013 Elsevier Interactive Patient Education  2017 [employer].

## 2017-08-13 NOTE — Progress Notes (Signed)
Subjective:    Patient ID: Kristina Mccoy, female    DOB: 22-May-1956, 61 y.o.   MRN: [id number]  HPI  Kristina Mccoy is a 61 year old female who presents today with mouth sores x one month.  She states that mouth sores "come and go" Associated sore throat and post nasal drip have been present. She was evaluated last on 07/25/17 for these symptoms. She completed doxycycline as she also had some sinus tenderness and pressure at that visit. Prior visit on 07/11/17, she was prescribed  Amoxicillin by another clinic and states that her throat felt better while taking amoxicillin.  She denies fever, chills, sweats, sinus pressure/pain, and ear pain  She has also been seen by a dentist to evaluate if symptoms could be related to her partial. No concerns were noted regarding her partial. Recent initiation of carafate and ranitidine by her GI provider for gastritis. She was also prescribed nexium however she has not taken this on a regular basis.  Sore throat is rated as 5 today when she is not taking acetaminophen. She experiences pain continually however acetaminophen provides benefit. She is a former smoker and has several autoimmune issues where she has been evaluated by multiple rheumatologists.    Review of Systems  Constitutional: Negative for chills, fatigue and fever.  HENT: Positive for mouth sores, postnasal drip and sore throat. Negative for ear pain, rhinorrhea, sinus pressure, sinus pain, sneezing and trouble swallowing.   Respiratory: Negative for cough and shortness of breath.   Cardiovascular: Negative for chest pain and palpitations.  Gastrointestinal: Negative for abdominal pain, diarrhea, nausea and vomiting.  Musculoskeletal: Negative for myalgias.  Skin: Negative for rash.  Neurological: Negative for dizziness and light-headedness.  Psychiatric/Behavioral:       Denies depressed or anxious mood today   Past Medical History:  Diagnosis Date  . Abdominal pain, epigastric   .  Allergic urticaria   . Blood clotting disorder ([place])   . Breast cancer ([place]) 5/14   left breast  . Breast cancer ([place]) 02/2013   Dr Laurance Flatten ([facility])  . Diabetes mellitus   . Diverticulosis   . Fibromyalgia   . GERD (gastroesophageal reflux disease)   . Heart murmur   . Hyperlipidemia   . Lymphedema of arm    right  . Myalgia   . Myositis   . Neuromyositis   . Otitis media, chronic   . Pain syndrome, chronic   . Peptic ulcer   . Sicca syndrome ([place])   . Sjogren's disease ([place])   . Sleep apnea   . SOB (shortness of breath)   . 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 Marital status: Single    Spouse name: Not on file  . Number of children: Not on file  . Years of education: Not on file  . Highest education level: Not on file  Social Needs  . Financial resource strain: Not on file  . Food insecurity - worry: Not on file  . Food insecurity - inability: Not on file  . Transportation needs - medical: Not on file  . Transportation needs - non-medical: Not on file  Occupational History  . Occupation: disabled  Tobacco Use  . Smoking status: Former Smoker    Packs/day: 0.50    Types: Cigarettes    Last attempt to quit: 02/28/2013    Years since quitting: 4.4  . Smokeless tobacco: Never Used  Substance and  Sexual Activity  . Alcohol use: No    Alcohol/week: 0.0 oz  . Drug use: No  . Sexual activity: No    Birth control/protection: Post-menopausal  Other Topics Concern  . Not on file  Social History Narrative  . Not on file    Past Surgical History:  Procedure Laterality Date  . BREAST SURGERY Bilateral    mastectomy  . MASTECTOMY Bilateral 03/23/13    Family History  Problem Relation Age of Onset  . Diabetes Mother   . Heart disease Mother   . Hyperlipidemia Mother   . Heart failure Mother   . Diabetes Other   . Heart disease Brother   . Hyperlipidemia Brother   . Colon polyps Brother   . Stroke  Maternal Grandmother   . Breast cancer Maternal Aunt   . Colon cancer Paternal Grandmother   . Ovarian cancer Neg Hx     Allergies  Allergen Reactions  . Gabapentin Itching and Swelling  . Oxycodone Itching and Rash  . Carbamazepine Nausea And Vomiting and Other (See Comments)    Abdominal pain   . Insulin Glargine Other (See Comments)    BLOATING, MUSCLE/JOINT PAIN  . Methylprednisolone     Reddened face, puffy face   . Morphine Other (See Comments)  . Pregabalin Swelling    Swelling in arms and legs  . Venlafaxine Other (See Comments)  . Buprenorphine Rash and Swelling  . Canagliflozin Other (See Comments) and Rash    YEAST INFECTIONS  . Hydroxychloroquine Rash    Current Outpatient Medications on File Prior to Visit  Medication Sig Dispense Refill  . acetaminophen (TYLENOL) 325 MG tablet Take 650 mg by mouth.    Marland Kitchen albuterol (PROVENTIL HFA;VENTOLIN HFA) 108 (90 Base) MCG/ACT inhaler Inhale 2 puffs into the lungs every 6 (six) hours as needed for wheezing or shortness of breath. 1 Inhaler 0  . ALPRAZolam (XANAX) 0.5 MG tablet TAKE ONE TABLET BY MOUTH FOUR TIMES A DAY AS NEEDED FOR ANXIETY 120 tablet 1  . Biotin 1 MG CAPS Take by mouth.    . cetirizine (ZYRTEC) 10 MG tablet Take by mouth.    . Cholecalciferol (VITAMIN D3) 1000 units CAPS Take by mouth.    . clobetasol ointment (TEMOVATE) 0.05 % Apply topically.    . Cyanocobalamin 1000 MCG CAPS Take by mouth.    . cyclobenzaprine (FLEXERIL) 10 MG tablet Take 1 tablet (10 mg total) by mouth 3 (three) times daily as needed for muscle spasms. 90 tablet 2  . dicyclomine (BENTYL) 10 MG capsule Take 1 capsule (10 mg total) by mouth 3 (three) times daily as needed for spasms. 60 capsule 1  . Dulaglutide 1.5 MG/0.5ML SOPN Inject into the skin.    . Emollient (CERAVE) CREA Apply topically.    Marland Kitchen esomeprazole (NEXIUM) 40 MG capsule Take 40 mg by mouth daily at 12 noon.    . fexofenadine-pseudoephedrine (ALLEGRA-D 24) 180-240 MG 24 hr  tablet Take 1 tablet by mouth daily.    . fluconazole (DIFLUCAN) 150 MG tablet Take 1 tablet (150 mg total) by mouth every 3 (three) days. 2 tablet 0  . FREESTYLE LITE test strip CHECK 6 TIMES DAILY 100 each 3  . glipiZIDE (GLUCOTROL) 10 MG tablet Take 1 tablet (10 mg total) by mouth 2 (two) times daily before a meal. 60 tablet 3  . glucose blood (ONE TOUCH ULTRA TEST) test strip CHECK SIX TIMES DAILY 100 each 11  . hydrOXYzine (ATARAX/VISTARIL) 10 MG tablet Take  by mouth.    . hyoscyamine (LEVSIN, ANASPAZ) 0.125 MG tablet TAKE ONE TABLET BY MOUTH EVERY 4 HOURS AS NEEDED 90 tablet 3  . insulin aspart (NOVOLOG FLEXPEN) 100 UNIT/ML FlexPen Take up to 35 units daily in divided dose as directed    . insulin NPH Human (HUMULIN N,NOVOLIN N) 100 UNIT/ML injection Inject 0.05 mLs (5 Units total) into the skin 2 (two) times daily before a meal. (Patient taking differently: Inject 5 Units into the skin 2 (two) times daily before a meal. 0.14mls in the morning and 16 units at night) 10 mL 11  . Insulin Syringe-Needle U-100 (INSULIN SYRINGE .5CC/31GX5/16") 31G X 5/16" 0.5 ML MISC Use twice daily with injections    . Lancets MISC Use. BAYER CONTOUR LANCETS    . ONETOUCH DELICA LANCETS 70J MISC TWICE DAILY 100 each 3  . prochlorperazine (COMPAZINE) 10 MG tablet Take 10 mg by mouth every 6 (six) hours as needed.    . RABEprazole (ACIPHEX) 20 MG tablet Take 20 mg by mouth daily.    . ranitidine (ZANTAC) 150 MG capsule Take 150 mg by mouth daily.    . sucralfate (CARAFATE) 1 g tablet Take 1 g by mouth 4 (four) times daily -  with meals and at bedtime.     . tamoxifen (NOLVADEX) 20 MG tablet Take 20 mg by mouth daily.    Marland Kitchen tiZANidine (ZANAFLEX) 2 MG tablet TAKE ONE TABLET BY MOUTH EVERY EIGHT HOURS AS NEEDED (HEADACHES) 60 tablet 1  . traMADol (ULTRAM) 50 MG tablet Take 1 tablet (50 mg total) by mouth every 6 (six) hours as needed. for pain 120 tablet 0   No current facility-administered medications on file prior  to visit.     BP 132/80   Pulse 71   Temp 97.8 F (36.6 C) (Oral)   Wt 191 lb 3.2 oz (86.7 kg)   SpO2 98%   BMI 32.82 kg/m       Objective:   Physical Exam  Constitutional: She is oriented to person, place, and time. She appears well-developed and well-nourished.  HENT:  Right Ear: Tympanic membrane normal.  Left Ear: Tympanic membrane normal.  Nose: No rhinorrhea. Right sinus exhibits no maxillary sinus tenderness and no frontal sinus tenderness. Left sinus exhibits no maxillary sinus tenderness and no frontal sinus tenderness.  Mouth/Throat: Oropharynx is clear and moist. No oropharyngeal exudate or posterior oropharyngeal erythema.  Mucous membranes moist. One mm well circumscribed, erythematous area on roof of mouth noted. No vesicles present. No associated discomfort.  Erythematous, inflamed patch noted in buccal mucosa on left side that is causing discomfort. No white patches noted, no erosive ulcerations present.   Eyes: Conjunctivae are normal. Pupils are equal, round, and reactive to light. No scleral icterus.  Neck: Normal range of motion. Neck supple.  Cardiovascular: Normal rate and regular rhythm.  Pulmonary/Chest: Effort normal and breath sounds normal. She has no wheezes. She has no rales.  Abdominal: Soft. Bowel sounds are normal. There is no tenderness.  Musculoskeletal: Normal range of motion. She exhibits no edema.  Lymphadenopathy:    She has no cervical adenopathy.  Neurological: She is alert and oriented to person, place, and time. Coordination normal.  Skin: Skin is warm and dry. No rash noted.  Psychiatric: She has a normal mood and affect. Her behavior is normal. Judgment and thought content normal.       Assessment & Plan:  1. Mouth sores Mouth soreness appears to be related to mucositis; see  treatment below.   2. Mucositis Present in buccal mucosa. While etiology is unclear; will treat symptomatically with magic mouthwash and provide oral  nystatin mouthwash to cover for possible fungal component as symptom has been present previously, antibiotics completed, and history of several autoimmune issues. Advised patient to follow up if symptoms do not improve with treatment, worsen, or she develops new symptoms. She will also consider an appointment with her ENT for evaluation if treatment does not provide relief.  3. Sore throat Exam is unremarkable; post nasal drip may be irritating throat; we also discussed the importance of following GI providers recommendations to avoid triggers for reflux and consistently take medication for gastritis as prescribed. Acetaminophen provides relief; and exam and history do not support providing a third antibiotic. She will use mouthwashes as prescribed, continue acetaminophen, and follow up if symptoms do not improve.  Delano Metz, FNP-C

## 2017-08-14 DIAGNOSIS — E11649 Type 2 diabetes mellitus with hypoglycemia without coma: Secondary | ICD-10-CM | POA: Diagnosis not present

## 2017-08-14 DIAGNOSIS — E1165 Type 2 diabetes mellitus with hyperglycemia: Secondary | ICD-10-CM | POA: Diagnosis not present

## 2017-08-14 DIAGNOSIS — Z794 Long term (current) use of insulin: Secondary | ICD-10-CM | POA: Diagnosis not present

## 2017-08-26 DIAGNOSIS — E11649 Type 2 diabetes mellitus with hypoglycemia without coma: Secondary | ICD-10-CM | POA: Diagnosis not present

## 2017-08-26 DIAGNOSIS — Z794 Long term (current) use of insulin: Secondary | ICD-10-CM | POA: Diagnosis not present

## 2017-08-26 DIAGNOSIS — E669 Obesity, unspecified: Secondary | ICD-10-CM | POA: Diagnosis not present

## 2017-08-26 DIAGNOSIS — Z6832 Body mass index (BMI) 32.0-32.9, adult: Secondary | ICD-10-CM | POA: Diagnosis not present

## 2017-09-01 ENCOUNTER — Other Ambulatory Visit: Payer: Self-pay | Admitting: Family Medicine

## 2017-09-03 ENCOUNTER — Encounter: Payer: Self-pay | Admitting: Family Medicine

## 2017-09-03 ENCOUNTER — Other Ambulatory Visit: Payer: Self-pay | Admitting: Family Medicine

## 2017-09-03 MED ORDER — TRAMADOL HCL 50 MG PO TABS: 50.0000 mg | ORAL_TABLET | Freq: Four times a day (QID) | ORAL | 0 refills | Status: DC | PRN

## 2017-09-03 NOTE — Telephone Encounter (Signed)
Copied from [place]. Topic: Quick Communication - See Telephone Encounter >> Sep 03, 2017 12:25 PM Corie Chiquito, [place] wrote: CRM for notification. See Telephone encounter for: Caryl Pina from [facility] called because she wanted to know if a fax request for tramadol refill was received for this patient. If someone could give them a call back at [phone]  09/03/17.

## 2017-09-03 NOTE — Telephone Encounter (Signed)
Already refilled. Please fax.

## 2017-09-03 NOTE — Telephone Encounter (Signed)
Tramadol  Fax  Request  From pharmacy  [place]

## 2017-09-03 NOTE — Telephone Encounter (Signed)
Disregard this message Dr. Caryl Bis. Meant to route to [place].

## 2017-09-03 NOTE — Telephone Encounter (Signed)
Please advise 

## 2017-09-03 NOTE — Telephone Encounter (Signed)
Last Ov 07/25/17 last filled 07/25/17 120 0rf

## 2017-09-04 NOTE — Telephone Encounter (Signed)
faxed

## 2017-09-05 DIAGNOSIS — M3500 Sicca syndrome, unspecified: Secondary | ICD-10-CM | POA: Diagnosis not present

## 2017-09-05 DIAGNOSIS — B370 Candidal stomatitis: Secondary | ICD-10-CM | POA: Diagnosis not present

## 2017-09-10 DIAGNOSIS — M3501 Sicca syndrome with keratoconjunctivitis: Secondary | ICD-10-CM | POA: Diagnosis not present

## 2017-10-02 DIAGNOSIS — Z794 Long term (current) use of insulin: Secondary | ICD-10-CM | POA: Diagnosis not present

## 2017-10-02 DIAGNOSIS — Z6832 Body mass index (BMI) 32.0-32.9, adult: Secondary | ICD-10-CM | POA: Diagnosis not present

## 2017-10-02 DIAGNOSIS — E11649 Type 2 diabetes mellitus with hypoglycemia without coma: Secondary | ICD-10-CM | POA: Diagnosis not present

## 2017-10-02 DIAGNOSIS — E669 Obesity, unspecified: Secondary | ICD-10-CM | POA: Diagnosis not present

## 2017-10-03 ENCOUNTER — Other Ambulatory Visit: Payer: Self-pay | Admitting: Family Medicine

## 2017-10-03 NOTE — Telephone Encounter (Signed)
Last OV 07/25/17 last filled 07/25/17 120 1rf

## 2017-10-03 NOTE — Telephone Encounter (Signed)
faxed

## 2017-10-06 ENCOUNTER — Encounter: Payer: Self-pay | Admitting: Family Medicine

## 2017-10-06 ENCOUNTER — Other Ambulatory Visit: Payer: Self-pay | Admitting: Family Medicine

## 2017-10-06 DIAGNOSIS — M3501 Sicca syndrome with keratoconjunctivitis: Secondary | ICD-10-CM | POA: Diagnosis not present

## 2017-10-08 DIAGNOSIS — G4733 Obstructive sleep apnea (adult) (pediatric): Secondary | ICD-10-CM | POA: Diagnosis not present

## 2017-10-14 ENCOUNTER — Other Ambulatory Visit: Payer: Self-pay | Admitting: Family Medicine

## 2017-10-15 NOTE — Telephone Encounter (Signed)
Last OV 07/25/17 last filed 09/03/17 120 0rf

## 2017-10-15 NOTE — Telephone Encounter (Signed)
faxed

## 2017-10-28 ENCOUNTER — Ambulatory Visit (INDEPENDENT_AMBULATORY_CARE_PROVIDER_SITE_OTHER): Payer: PPO | Admitting: Family Medicine

## 2017-10-28 ENCOUNTER — Other Ambulatory Visit: Payer: Self-pay

## 2017-10-28 ENCOUNTER — Encounter: Payer: Self-pay | Admitting: Family Medicine

## 2017-10-28 VITALS — BP 120/70 | HR 75 | Temp 97.3°F | Ht 64.0 in | Wt 176.4 lb

## 2017-10-28 DIAGNOSIS — M3500 Sicca syndrome, unspecified: Secondary | ICD-10-CM | POA: Diagnosis not present

## 2017-10-28 DIAGNOSIS — E669 Obesity, unspecified: Secondary | ICD-10-CM | POA: Insufficient documentation

## 2017-10-28 DIAGNOSIS — R1011 Right upper quadrant pain: Secondary | ICD-10-CM

## 2017-10-28 DIAGNOSIS — E66811 Obesity, class 1: Secondary | ICD-10-CM

## 2017-10-28 DIAGNOSIS — J0100 Acute maxillary sinusitis, unspecified: Secondary | ICD-10-CM | POA: Diagnosis not present

## 2017-10-28 LAB — COMPREHENSIVE METABOLIC PANEL
ALT: 45 U/L — ABNORMAL HIGH (ref 0–35)
AST: 35 U/L (ref 0–37)
Albumin: 4.2 g/dL (ref 3.5–5.2)
Alkaline Phosphatase: 124 U/L — ABNORMAL HIGH (ref 39–117)
BUN: 12 mg/dL (ref 6–23)
CO2: 30 mEq/L (ref 19–32)
Calcium: 9.4 mg/dL (ref 8.4–10.5)
Chloride: 101 mEq/L (ref 96–112)
Creatinine, Ser: 0.76 mg/dL (ref 0.40–1.20)
GFR: 82.57 mL/min (ref >60.00)
Glucose, Bld: 148 mg/dL — ABNORMAL HIGH (ref 70–99)
Potassium: 3.7 mEq/L (ref 3.5–5.1)
Sodium: 137 mEq/L (ref 135–145)
Total Bilirubin: 0.4 mg/dL (ref 0.2–1.2)
Total Protein: 7.9 g/dL (ref 6.0–8.3)

## 2017-10-28 LAB — LIPASE: Lipase: 74 U/L — ABNORMAL HIGH (ref 11.0–59.0)

## 2017-10-28 MED ORDER — AMOXICILLIN-POT CLAVULANATE 875-125 MG PO TABS: 1.0000 | ORAL_TABLET | Freq: Two times a day (BID) | ORAL | 0 refills | Status: DC

## 2017-10-28 MED ORDER — ONDANSETRON HCL 4 MG PO TABS: 4.0000 mg | ORAL_TABLET | Freq: Three times a day (TID) | ORAL | 0 refills | Status: DC | PRN

## 2017-10-28 MED ORDER — PANTOPRAZOLE SODIUM 40 MG PO TBEC
40.0000 mg | DELAYED_RELEASE_TABLET | Freq: Every day | ORAL | 3 refills | Status: DC
Start: 2017-10-28 — End: 2018-02-24

## 2017-10-28 NOTE — Assessment & Plan Note (Signed)
Weight has been trending down with dietary changes and medication changes.  She will continue to monitor.

## 2017-10-28 NOTE — Assessment & Plan Note (Signed)
Symptoms consistent with sinusitis.  Will treat with Augmentin.  If not improving she will let us know. 

## 2017-10-28 NOTE — Assessment & Plan Note (Signed)
Chronic intermittent issue noticed more over the last several days.  Minimal discomfort on exam.  She does have some chronic epigastric discomfort as well that could be related to gastritis.  Symptoms could be hepatic, gallbladder, or stomach origin.  Less likely pancreatic in origin.  We will check lab work.  We will have her try Protonix instead of Nexium.  Zofran for nausea.  Given return precautions.

## 2017-10-28 NOTE — Patient Instructions (Signed)
Nice to see you. We will trial you on Protonix to see if that helps with your symptoms.  You should take this 30 minutes prior to breakfast.  We will get lab work as well to rule out other causes. Please take the Augmentin for your sinus infection. If you have fevers or develop worsening stomach discomfort please be evaluated.

## 2017-10-28 NOTE — Progress Notes (Signed)
Kristina Rumps, MD Phone: [phone]  Kristina Mccoy is a 62 y.o. female who presents today for follow-up.  Patient notes sinus congestion and nasal congestion for the past 10 days or so.  Some headache with this.  Ears feel full.  Sinuses feel full.  Pressure sensation.  No fevers though has had some chills.  She has chronic intermittent right upper quadrant pain that has been going on for sometime.  Slightly worse over the last 3 days.  Has occurred on 3 occasions.  Feels like an internal spasm.  Occurs when she eats or bends over.  No vomiting.  A little nausea that is chronic.  No diarrhea or blood in her stool.  Has bowel movements daily.  No foods that specifically bring it on.  Does note chronic gastric irritation for which she is supposed to be on Nexium though does not take it due to nausea related to it.  Does take Zantac and Carafate.  She is worked on losing weight by changing her diet.  She is eating very little meat and eating a lot of roughage and greens.  She also had her medications changed to more weight friendly medications by endocrinology.  She is trying to exercise a little bit.  Sjogren's: Has ophthalmologic involvement with dry eyes and discomfort.  She has seen ophthalmology and they gave her eyedrops as well as told her to use hot and cold compresses.  Has chronic joint pain which is related to her Sjogren's.  Some fogginess related to this as well.  Social History   Tobacco Use  Smoking Status Former Smoker  . Packs/day: 0.50  . Types: Cigarettes  . Last attempt to quit: 02/28/2013  . Years since quitting: 4.6  Smokeless Tobacco Never Used     ROS see history of present illness  Objective  Physical Exam Vitals:   10/28/17 1351  BP: 120/70  Pulse: 75  Temp: (!) 97.3 F (36.3 C)  SpO2: 99%    BP Readings from Last 3 Encounters:  10/28/17 120/70  08/13/17 132/80  07/25/17 124/70   Wt Readings from Last 3 Encounters:  10/28/17 176 lb 6.4 oz (80  kg)  08/13/17 191 lb 3.2 oz (86.7 kg)  07/25/17 185 lb (83.9 kg)    Physical Exam  Constitutional: No distress.  HENT:  Head: Normocephalic and atraumatic.  Mouth/Throat: Oropharynx is clear and moist. No oropharyngeal exudate.  TMs normal  Eyes: Conjunctivae are normal. Pupils are equal, round, and reactive to light.  Neck: Neck supple.  Cardiovascular: Normal rate, regular rhythm and normal heart sounds.  Pulmonary/Chest: Effort normal and breath sounds normal.  Abdominal: Soft. Bowel sounds are normal. She exhibits no distension. There is tenderness (Slight right upper quadrant tenderness). There is no rebound and no guarding.  Musculoskeletal: She exhibits no edema.  Lymphadenopathy:    She has no cervical adenopathy.  Neurological: She is alert. Gait normal.  Skin: Skin is warm and dry. She is not diaphoretic.     Assessment/Plan: Please see individual problem list.  Sinusitis Symptoms consistent with sinusitis.  Will treat with Augmentin.  If not improving she will let us know.  Sjogren's syndrome ([place]) This is contributing to a number of her issues.  She will continue to follow with rheumatology.  RUQ pain Chronic intermittent issue noticed more over the last several days.  Minimal discomfort on exam.  She does have some chronic epigastric discomfort as well that could be related to gastritis.  Symptoms could be hepatic,  gallbladder, or stomach origin.  Less likely pancreatic in origin.  We will check lab work.  We will have her try Protonix instead of Nexium.  Zofran for nausea.  Given return precautions.  Obesity (BMI 30.0-34.9) Weight has been trending down with dietary changes and medication changes.  She will continue to monitor.   Orders Placed This Encounter  Procedures  . Comp Met (CMET)  . Lipase    Meds ordered this encounter  Medications  . pantoprazole (PROTONIX) 40 MG tablet    Sig: Take 1 tablet (40 mg total) by mouth daily.    Dispense:  30  tablet    Refill:  3  . amoxicillin-clavulanate (AUGMENTIN) 875-125 MG tablet    Sig: Take 1 tablet by mouth 2 (two) times daily.    Dispense:  14 tablet    Refill:  0  . ondansetron (ZOFRAN) 4 MG tablet    Sig: Take 1 tablet (4 mg total) by mouth every 8 (eight) hours as needed for nausea or vomiting.    Dispense:  20 tablet    Refill:  0     Kristina Rumps, MD [place]

## 2017-10-28 NOTE — Assessment & Plan Note (Signed)
This is contributing to a number of her issues.  She will continue to follow with rheumatology.

## 2017-10-29 ENCOUNTER — Other Ambulatory Visit: Payer: Self-pay | Admitting: Family Medicine

## 2017-10-29 DIAGNOSIS — R748 Abnormal levels of other serum enzymes: Secondary | ICD-10-CM

## 2017-10-29 NOTE — Progress Notes (Signed)
Patient states she will check with her insurance first

## 2017-10-30 ENCOUNTER — Other Ambulatory Visit (INDEPENDENT_AMBULATORY_CARE_PROVIDER_SITE_OTHER): Payer: PPO

## 2017-10-30 DIAGNOSIS — R748 Abnormal levels of other serum enzymes: Secondary | ICD-10-CM | POA: Diagnosis not present

## 2017-10-30 DIAGNOSIS — R945 Abnormal results of liver function studies: Secondary | ICD-10-CM | POA: Diagnosis not present

## 2017-10-30 LAB — LIPASE: Lipase: 54 U/L (ref 11.0–59.0)

## 2017-11-04 DIAGNOSIS — Z794 Long term (current) use of insulin: Secondary | ICD-10-CM | POA: Diagnosis not present

## 2017-11-04 DIAGNOSIS — Z6832 Body mass index (BMI) 32.0-32.9, adult: Secondary | ICD-10-CM | POA: Diagnosis not present

## 2017-11-04 DIAGNOSIS — E11649 Type 2 diabetes mellitus with hypoglycemia without coma: Secondary | ICD-10-CM | POA: Diagnosis not present

## 2017-11-04 DIAGNOSIS — E1165 Type 2 diabetes mellitus with hyperglycemia: Secondary | ICD-10-CM | POA: Diagnosis not present

## 2017-11-04 DIAGNOSIS — E669 Obesity, unspecified: Secondary | ICD-10-CM | POA: Diagnosis not present

## 2017-11-07 ENCOUNTER — Other Ambulatory Visit: Payer: Self-pay | Admitting: Family Medicine

## 2017-11-07 NOTE — Telephone Encounter (Signed)
Last OV 10/28/17 last filled 11/28/16 60 1rf

## 2017-11-17 ENCOUNTER — Other Ambulatory Visit: Payer: Self-pay | Admitting: Family Medicine

## 2017-11-18 ENCOUNTER — Other Ambulatory Visit: Payer: Self-pay | Admitting: Family Medicine

## 2017-11-19 NOTE — Telephone Encounter (Signed)
Okay to refill? Last written on 10/15/17.  LOV: 10/28/17 NOV: 01/26/18

## 2017-11-19 NOTE — Telephone Encounter (Signed)
Okay to refill Tramadol? Last written on 10/15/17 #120, no refills.  LOV: 10/28/17 NOV: 01/26/18

## 2017-11-20 ENCOUNTER — Encounter: Payer: Self-pay | Admitting: Family Medicine

## 2017-11-20 MED ORDER — TRAMADOL HCL 50 MG PO TABS: 50.0000 mg | ORAL_TABLET | Freq: Four times a day (QID) | ORAL | 0 refills | Status: DC | PRN

## 2017-12-02 DIAGNOSIS — E669 Obesity, unspecified: Secondary | ICD-10-CM | POA: Diagnosis not present

## 2017-12-02 DIAGNOSIS — E1165 Type 2 diabetes mellitus with hyperglycemia: Secondary | ICD-10-CM | POA: Diagnosis not present

## 2017-12-02 DIAGNOSIS — Z6832 Body mass index (BMI) 32.0-32.9, adult: Secondary | ICD-10-CM | POA: Diagnosis not present

## 2017-12-02 DIAGNOSIS — Z794 Long term (current) use of insulin: Secondary | ICD-10-CM | POA: Diagnosis not present

## 2017-12-02 DIAGNOSIS — E11649 Type 2 diabetes mellitus with hypoglycemia without coma: Secondary | ICD-10-CM | POA: Diagnosis not present

## 2017-12-04 ENCOUNTER — Encounter: Payer: Self-pay | Admitting: Family Medicine

## 2017-12-04 ENCOUNTER — Other Ambulatory Visit: Payer: Self-pay | Admitting: Family Medicine

## 2017-12-04 MED ORDER — ALPRAZOLAM 0.5 MG PO TABS: ORAL_TABLET | ORAL | 1 refills | Status: DC

## 2017-12-04 NOTE — Telephone Encounter (Signed)
Last OV 10/28/17 last filled 10/03/17 120 1rf

## 2017-12-11 DIAGNOSIS — Z9013 Acquired absence of bilateral breasts and nipples: Secondary | ICD-10-CM | POA: Diagnosis not present

## 2017-12-11 DIAGNOSIS — C50112 Malignant neoplasm of central portion of left female breast: Secondary | ICD-10-CM | POA: Diagnosis not present

## 2017-12-11 DIAGNOSIS — C50111 Malignant neoplasm of central portion of right female breast: Secondary | ICD-10-CM | POA: Diagnosis not present

## 2017-12-12 DIAGNOSIS — Z794 Long term (current) use of insulin: Secondary | ICD-10-CM | POA: Diagnosis not present

## 2017-12-12 DIAGNOSIS — Z6832 Body mass index (BMI) 32.0-32.9, adult: Secondary | ICD-10-CM | POA: Diagnosis not present

## 2017-12-12 DIAGNOSIS — E669 Obesity, unspecified: Secondary | ICD-10-CM | POA: Diagnosis not present

## 2017-12-12 DIAGNOSIS — E1165 Type 2 diabetes mellitus with hyperglycemia: Secondary | ICD-10-CM | POA: Diagnosis not present

## 2017-12-12 DIAGNOSIS — E11649 Type 2 diabetes mellitus with hypoglycemia without coma: Secondary | ICD-10-CM | POA: Diagnosis not present

## 2017-12-18 ENCOUNTER — Telehealth: Payer: Self-pay | Admitting: *Deleted

## 2017-12-18 NOTE — Telephone Encounter (Signed)
LMTCB in regards to future follow-ups with our office. Pt had another low dose CT ordered on 11/07/17 that has not been performed by [facility].

## 2017-12-18 NOTE — Telephone Encounter (Signed)
Pt is returning your call

## 2017-12-18 NOTE — Telephone Encounter (Signed)
-----   Message from Lorane Gell, [place] sent at 12/17/2017  4:25 PM EDT ----- Another VM patient that was last seen 2017-need to see about another OV and any other testing that needs to be done. CT Chest wo was requested 02-2017 and not done.    Katie

## 2017-12-18 NOTE — Telephone Encounter (Signed)
Returned call to patient who also didn't think she needed a follow-up. She has been seeing her pcp, I asked who was managing her OSA and she says we were. Explained to patent she would at least need yearly visits in order for [place] to be able to order supplies. Pt asked for names of providers here since VM is no longer with the practice. She will do her research and call back to schedule appt. Nothing further needed.

## 2017-12-21 ENCOUNTER — Encounter: Payer: Self-pay | Admitting: Family Medicine

## 2017-12-22 ENCOUNTER — Other Ambulatory Visit: Payer: Self-pay

## 2017-12-22 ENCOUNTER — Ambulatory Visit (INDEPENDENT_AMBULATORY_CARE_PROVIDER_SITE_OTHER): Payer: PPO | Admitting: Family Medicine

## 2017-12-22 VITALS — BP 112/64 | HR 80 | Temp 97.9°F | Wt 169.4 lb

## 2017-12-22 DIAGNOSIS — R1013 Epigastric pain: Secondary | ICD-10-CM

## 2017-12-22 DIAGNOSIS — R300 Dysuria: Secondary | ICD-10-CM

## 2017-12-22 DIAGNOSIS — E1165 Type 2 diabetes mellitus with hyperglycemia: Secondary | ICD-10-CM

## 2017-12-22 DIAGNOSIS — R1011 Right upper quadrant pain: Secondary | ICD-10-CM | POA: Diagnosis not present

## 2017-12-22 LAB — POCT URINALYSIS DIPSTICK
Bilirubin, UA: NEGATIVE
Glucose, UA: NEGATIVE
Ketones, UA: NEGATIVE
Nitrite, UA: NEGATIVE
Protein, UA: NEGATIVE
Spec Grav, UA: <=1.005 — AB (ref 1.010–1.025)
Urobilinogen, UA: 0.2 E.U./dL
pH, UA: 6.5 (ref 5.0–8.0)

## 2017-12-22 LAB — MICROALBUMIN / CREATININE URINE RATIO
Creatinine,U: 13.8 mg/dL
Microalb Creat Ratio: 5.1 mg/g (ref 0.0–30.0)
Microalb, Ur: <0.7 mg/dL (ref 0.0–1.9)

## 2017-12-22 NOTE — Assessment & Plan Note (Signed)
Recent dysuria.  Episode of sharp discomfort when urinating several weeks ago.  Possible kidney stone though no history.  No pain at this point other than some burning with urination.  Will check urinalysis.  We will contact her with the results.

## 2017-12-22 NOTE — Assessment & Plan Note (Signed)
Somewhat improved on last A1c.  She needs a urine microalbumin.  This was ordered.  She will continue to follow with endocrinology.

## 2017-12-22 NOTE — Patient Instructions (Signed)
Nice to see you. We will get lab work today and contact you with the results.  If your severe abdominal pain recurs develop blood in your stool, or any new or changing symptoms please go to the emergency room.

## 2017-12-22 NOTE — Assessment & Plan Note (Addendum)
Chronic intermittent issue.  Had fairly severe discomfort yesterday in the epigastric region.  Minimal discomfort on exam today.  Differential is fairly wide with hepatic, gallbladder, and stomach origin as well as pancreatitis being possibilities.  Could also be constipation related.  Kristina Mccoy is in no distress at this time and is adequate for outpatient evaluation.  Lab work ordered.  Discussed if it appears Kristina Mccoy has pancreatitis ED evaluation will be recommended.  Potentially Kristina Mccoy will need an ultrasound depending on lab results as well.  Discussed return precautions.  Kristina Mccoy will additionally need to follow-up with GI as planned in April.

## 2017-12-22 NOTE — Telephone Encounter (Signed)
Please call the patient and advise that she will need to be seen for this given that she is having severe pain. Please get more details. If she has current pain she needs to go get evaluated. If she does not have pain now we could see her in the office. Thanks.

## 2017-12-22 NOTE — Progress Notes (Signed)
[name], MD Phone: [phone]  Kristina Mccoy is a 62 y.o. female who presents today for same-day visit.  Patient notes chronic right upper quadrant pain that has worsened over the past several days.  She notes over the last several days she has had relatively severe discomfort in her epigastric region after eating.  Yesterday lasted from 9:30 in the morning to 10:00 at night.  She sat with a heating pad and tramadol and that eased things off.  Describes it as intense.  Did have some radiation to her right upper quadrant.  No other radiation.  Has been taking Protonix daily.  She has been belching a lot.  Also some nausea yesterday.  No vomiting or diarrhea.  Does note some constipation with her last bowel movement 3 days ago though she is passing gas daily.  She notes the pain is significantly improved and is barely noticeable at this time.  Does feel somewhat bloated.  Some dysuria and urinary frequency as well as urgency.  She notes several weeks ago she had a sharp discomfort when she urinated though this only occurred once.  No vaginal discharge.  No chest pain or shortness of breath.  Does note her sugars have been relatively well controlled recently.  They do go up depending on if she eats something sweet.  She is only on NovoLog now.  She does follow with endocrinology.  She notes she follows with gynecology for her Pap smears and had an exam this past year.  She reports she will be due for a Pap smear this year.  Social History   Tobacco Use  Smoking Status Former Smoker  . Packs/day: 0.50  . Types: Cigarettes  . Last attempt to quit: 02/28/2013  . Years since quitting: 4.8  Smokeless Tobacco Never Used     ROS see history of present illness  Objective  Physical Exam Vitals:   12/22/17 1452  BP: 112/64  Pulse: 80  Temp: 97.9 F (36.6 C)  SpO2: 97%    BP Readings from Last 3 Encounters:  12/22/17 112/64  10/28/17 120/70  08/13/17 132/80   Wt Readings from  Last 3 Encounters:  12/22/17 169 lb 6.4 oz (76.8 kg)  10/28/17 176 lb 6.4 oz (80 kg)  08/13/17 191 lb 3.2 oz (86.7 kg)    Physical Exam  Constitutional: No distress.  Cardiovascular: Normal rate, regular rhythm and normal heart sounds.  Pulmonary/Chest: Effort normal and breath sounds normal.  Abdominal: Soft. Bowel sounds are normal. She exhibits no distension.  Very minimal soreness in the epigastric region, no rebound or guarding  Musculoskeletal: She exhibits no edema.  Neurological: She is alert. Gait normal.  Skin: Skin is warm and dry. She is not diaphoretic.     Assessment/Plan: Please see individual problem list.  RUQ pain Chronic intermittent issue.  Had fairly severe discomfort yesterday in the epigastric region.  Minimal discomfort on exam today.  Differential is fairly wide with hepatic, gallbladder, and stomach origin as well as pancreatitis being possibilities.  Could also be constipation related.  She is in no distress at this time and is adequate for outpatient evaluation.  Lab work ordered.  Discussed if it appears she has pancreatitis ED evaluation will be recommended.  Potentially she will need an ultrasound depending on lab results as well.  Discussed return precautions.  She will additionally need to follow-up with GI as planned in April.  Dysuria Recent dysuria.  Episode of sharp discomfort when urinating several weeks ago.  Possible kidney stone though no history.  No pain at this point other than some burning with urination.  Will check urinalysis.  We will contact her with the results.  Diabetes type 2, uncontrolled ([place]) Somewhat improved on last A1c.  She needs a urine microalbumin.  This was ordered.  She will continue to follow with endocrinology.  She will make sure she sees gynecology for her yearly exam as planned. Weight loss noted after the patient left the office.  We will have CMA contact the patient to get more details.  Orders Placed This  Encounter  Procedures  . Comp Met (CMET)  . Lipase  . CBC w/Diff  . Urine Microalbumin w/creat. ratio  . POCT Urinalysis Dipstick    No orders of the defined types were placed in this encounter.    [name], MD [place]

## 2017-12-23 ENCOUNTER — Telehealth: Payer: Self-pay | Admitting: Family Medicine

## 2017-12-23 ENCOUNTER — Telehealth: Payer: Self-pay

## 2017-12-23 ENCOUNTER — Other Ambulatory Visit: Payer: Self-pay | Admitting: Family Medicine

## 2017-12-23 DIAGNOSIS — Z170 Estrogen receptor positive status [ER+]: Secondary | ICD-10-CM | POA: Diagnosis not present

## 2017-12-23 DIAGNOSIS — Z794 Long term (current) use of insulin: Secondary | ICD-10-CM | POA: Diagnosis not present

## 2017-12-23 DIAGNOSIS — E1165 Type 2 diabetes mellitus with hyperglycemia: Secondary | ICD-10-CM | POA: Diagnosis not present

## 2017-12-23 DIAGNOSIS — R945 Abnormal results of liver function studies: Principal | ICD-10-CM

## 2017-12-23 DIAGNOSIS — R300 Dysuria: Secondary | ICD-10-CM

## 2017-12-23 DIAGNOSIS — R7989 Other specified abnormal findings of blood chemistry: Secondary | ICD-10-CM

## 2017-12-23 DIAGNOSIS — C50411 Malignant neoplasm of upper-outer quadrant of right female breast: Secondary | ICD-10-CM | POA: Diagnosis not present

## 2017-12-23 DIAGNOSIS — M3500 Sicca syndrome, unspecified: Secondary | ICD-10-CM | POA: Diagnosis not present

## 2017-12-23 DIAGNOSIS — Z6829 Body mass index (BMI) 29.0-29.9, adult: Secondary | ICD-10-CM | POA: Diagnosis not present

## 2017-12-23 LAB — CBC WITH DIFFERENTIAL/PLATELET
Basophils Absolute: 0.1 10*3/uL (ref 0.0–0.1)
Basophils Relative: 1 % (ref 0.0–3.0)
Eosinophils Absolute: 0.3 10*3/uL (ref 0.0–0.7)
Eosinophils Relative: 4.9 % (ref 0.0–5.0)
HCT: 42.7 % (ref 36.0–46.0)
Hemoglobin: 14.7 g/dL (ref 12.0–15.0)
Lymphocytes Relative: 24.8 % (ref 12.0–46.0)
Lymphs Abs: 1.5 10*3/uL (ref 0.7–4.0)
MCHC: 34.4 g/dL (ref 30.0–36.0)
MCV: 91 fl (ref 78.0–100.0)
Monocytes Absolute: 0.9 10*3/uL (ref 0.1–1.0)
Monocytes Relative: 15.2 % — ABNORMAL HIGH (ref 3.0–12.0)
Neutro Abs: 3.3 10*3/uL (ref 1.4–7.7)
Neutrophils Relative %: 54.1 % (ref 43.0–77.0)
Platelets: 190 10*3/uL (ref 150.0–400.0)
RBC: 4.7 Mil/uL (ref 3.87–5.11)
RDW: 14.7 % (ref 11.5–15.5)
WBC: 6.1 10*3/uL (ref 4.0–10.5)

## 2017-12-23 LAB — LIPASE: Lipase: 27 U/L (ref 11.0–59.0)

## 2017-12-23 LAB — COMPREHENSIVE METABOLIC PANEL
ALT: 135 U/L — ABNORMAL HIGH (ref 0–35)
AST: 112 U/L — ABNORMAL HIGH (ref 0–37)
Albumin: 4.1 g/dL (ref 3.5–5.2)
Alkaline Phosphatase: 194 U/L — ABNORMAL HIGH (ref 39–117)
BUN: 8 mg/dL (ref 6–23)
CO2: 29 mEq/L (ref 19–32)
Calcium: 9.7 mg/dL (ref 8.4–10.5)
Chloride: 98 mEq/L (ref 96–112)
Creatinine, Ser: 0.81 mg/dL (ref 0.40–1.20)
GFR: 76.67 mL/min (ref >60.00)
Glucose, Bld: 136 mg/dL — ABNORMAL HIGH (ref 70–99)
Potassium: 4.1 mEq/L (ref 3.5–5.1)
Sodium: 135 mEq/L (ref 135–145)
Total Bilirubin: 0.8 mg/dL (ref 0.2–1.2)
Total Protein: 8.2 g/dL (ref 6.0–8.3)

## 2017-12-23 NOTE — Telephone Encounter (Signed)
Pt. Given message that she needs to return to lab for a urine culture. Appointment made for tomorrow at her request.

## 2017-12-23 NOTE — Telephone Encounter (Signed)
-----   Message from [name], MD sent at 12/22/2017  6:55 PM EDT ----- Is there anyway we can send this urine for culture and microscopy?  If so could you please place the orders.  Thanks.

## 2017-12-23 NOTE — Addendum Note (Signed)
Addended by: Leeanne Rio on: 12/23/2017 09:16 AM   Modules accepted: Orders

## 2017-12-23 NOTE — Telephone Encounter (Signed)
I heard back from our lab & they confirmed that there was not enough urine to run additional test. So the orders are in for pt to come tomorrow to recollect.

## 2017-12-23 NOTE — Addendum Note (Signed)
Addended by: Leeanne Rio on: 12/23/2017 09:13 AM   Modules accepted: Orders

## 2017-12-23 NOTE — Progress Notes (Signed)
See result note.  

## 2017-12-24 ENCOUNTER — Other Ambulatory Visit (INDEPENDENT_AMBULATORY_CARE_PROVIDER_SITE_OTHER): Payer: PPO

## 2017-12-24 ENCOUNTER — Other Ambulatory Visit: Payer: Self-pay | Admitting: Family Medicine

## 2017-12-24 DIAGNOSIS — R300 Dysuria: Secondary | ICD-10-CM | POA: Diagnosis not present

## 2017-12-24 LAB — URINALYSIS, MICROSCOPIC ONLY

## 2017-12-25 DIAGNOSIS — L218 Other seborrheic dermatitis: Secondary | ICD-10-CM | POA: Diagnosis not present

## 2017-12-25 DIAGNOSIS — D229 Melanocytic nevi, unspecified: Secondary | ICD-10-CM | POA: Diagnosis not present

## 2017-12-25 LAB — URINE CULTURE
MICRO NUMBER:: 90382321
Result:: NO GROWTH
SPECIMEN QUALITY:: ADEQUATE

## 2017-12-25 NOTE — Telephone Encounter (Signed)
Sent to pharmacy.  Please contact the patient and inform her.  Please also counsel her that she should not take the tramadol with her Xanax as it could make her excessively drowsy.  If she has drowsiness she should not drive.  She should not take either of these things when she drinks alcohol if she does drink alcohol.  Thanks.

## 2017-12-25 NOTE — Telephone Encounter (Signed)
Last OV 12/22/17 last filled Tramadol 11/20/17 120 0rf Ondansetron 10/28/17 20 0rf

## 2017-12-26 ENCOUNTER — Ambulatory Visit: Payer: PPO

## 2017-12-30 ENCOUNTER — Ambulatory Visit: Payer: PPO | Admitting: Internal Medicine

## 2017-12-30 ENCOUNTER — Ambulatory Visit
Admission: RE | Admit: 2017-12-30 | Discharge: 2017-12-30 | Disposition: A | Discharge status: Home / Self Care | Payer: PPO | Source: Ambulatory Visit | Attending: Family Medicine | Admitting: Family Medicine

## 2017-12-30 DIAGNOSIS — R7989 Other specified abnormal findings of blood chemistry: Secondary | ICD-10-CM | POA: Diagnosis not present

## 2017-12-30 DIAGNOSIS — K760 Fatty (change of) liver, not elsewhere classified: Secondary | ICD-10-CM | POA: Diagnosis not present

## 2017-12-30 DIAGNOSIS — R1013 Epigastric pain: Secondary | ICD-10-CM | POA: Insufficient documentation

## 2017-12-30 DIAGNOSIS — R945 Abnormal results of liver function studies: Secondary | ICD-10-CM

## 2017-12-31 ENCOUNTER — Telehealth: Payer: Self-pay | Admitting: *Deleted

## 2017-12-31 NOTE — Telephone Encounter (Signed)
Received referral for low dose lung cancer screening CT scan. Message left at phone number listed in EMR for patient to call me back to facilitate scheduling scan.  

## 2017-12-31 NOTE — Telephone Encounter (Signed)
Patient notified

## 2018-01-01 ENCOUNTER — Telehealth: Payer: Self-pay | Admitting: *Deleted

## 2018-01-01 NOTE — Telephone Encounter (Signed)
Received referral for low dose lung cancer screening CT scan. Message left at phone number listed in EMR for patient to call me back to facilitate scheduling scan.  

## 2018-01-05 ENCOUNTER — Ambulatory Visit: Payer: PPO

## 2018-01-05 ENCOUNTER — Other Ambulatory Visit: Payer: Self-pay | Admitting: Gastroenterology

## 2018-01-05 DIAGNOSIS — R1084 Generalized abdominal pain: Secondary | ICD-10-CM

## 2018-01-05 DIAGNOSIS — R945 Abnormal results of liver function studies: Secondary | ICD-10-CM | POA: Diagnosis not present

## 2018-01-07 ENCOUNTER — Encounter: Payer: Self-pay | Admitting: Internal Medicine

## 2018-01-08 ENCOUNTER — Ambulatory Visit (INDEPENDENT_AMBULATORY_CARE_PROVIDER_SITE_OTHER): Payer: PPO | Admitting: Internal Medicine

## 2018-01-08 ENCOUNTER — Ambulatory Visit
Admission: RE | Admit: 2018-01-08 | Discharge: 2018-01-08 | Disposition: A | Discharge status: Home / Self Care | Payer: PPO | Source: Ambulatory Visit | Attending: Internal Medicine | Admitting: Internal Medicine

## 2018-01-08 ENCOUNTER — Encounter: Payer: Self-pay | Admitting: Internal Medicine

## 2018-01-08 VITALS — BP 110/80 | HR 81 | Resp 16 | Ht 64.0 in | Wt 169.0 lb

## 2018-01-08 DIAGNOSIS — R0600 Dyspnea, unspecified: Secondary | ICD-10-CM

## 2018-01-08 DIAGNOSIS — R0609 Other forms of dyspnea: Secondary | ICD-10-CM

## 2018-01-08 DIAGNOSIS — Z9989 Dependence on other enabling machines and devices: Secondary | ICD-10-CM | POA: Diagnosis not present

## 2018-01-08 DIAGNOSIS — G4733 Obstructive sleep apnea (adult) (pediatric): Secondary | ICD-10-CM

## 2018-01-08 DIAGNOSIS — J42 Unspecified chronic bronchitis: Secondary | ICD-10-CM | POA: Diagnosis not present

## 2018-01-08 IMAGING — CR DG CHEST 2V
1 series · 2 of 2 positions shown · non-contrast
Comparison: [DATE]

CLINICAL DATA: Dyspnea on exertion, history breast cancer post
BILATERAL mastectomy

EXAM:
CHEST - 2 VIEW

[Series 1: dg chest 2 view · 0.14mm/px · 2 of 2 slices shown]
[im 1/2]
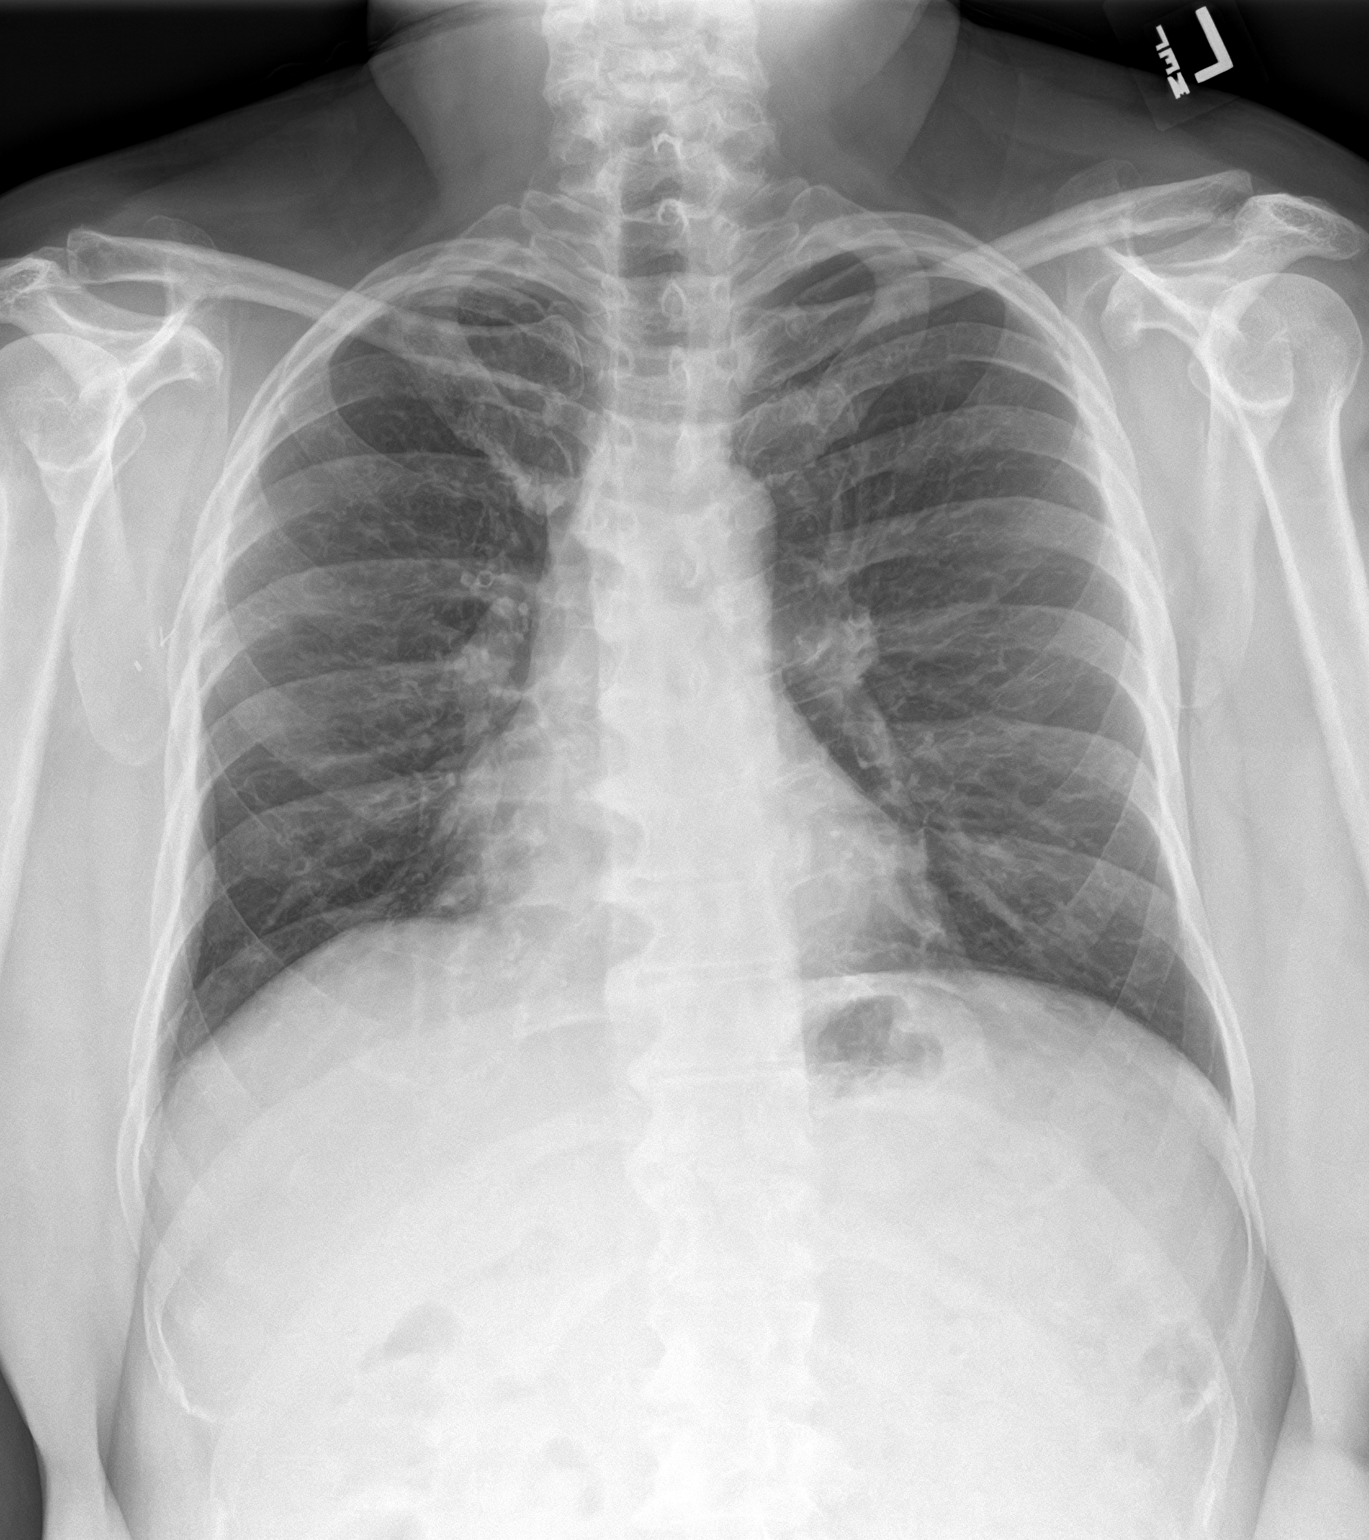
[im 2/2]
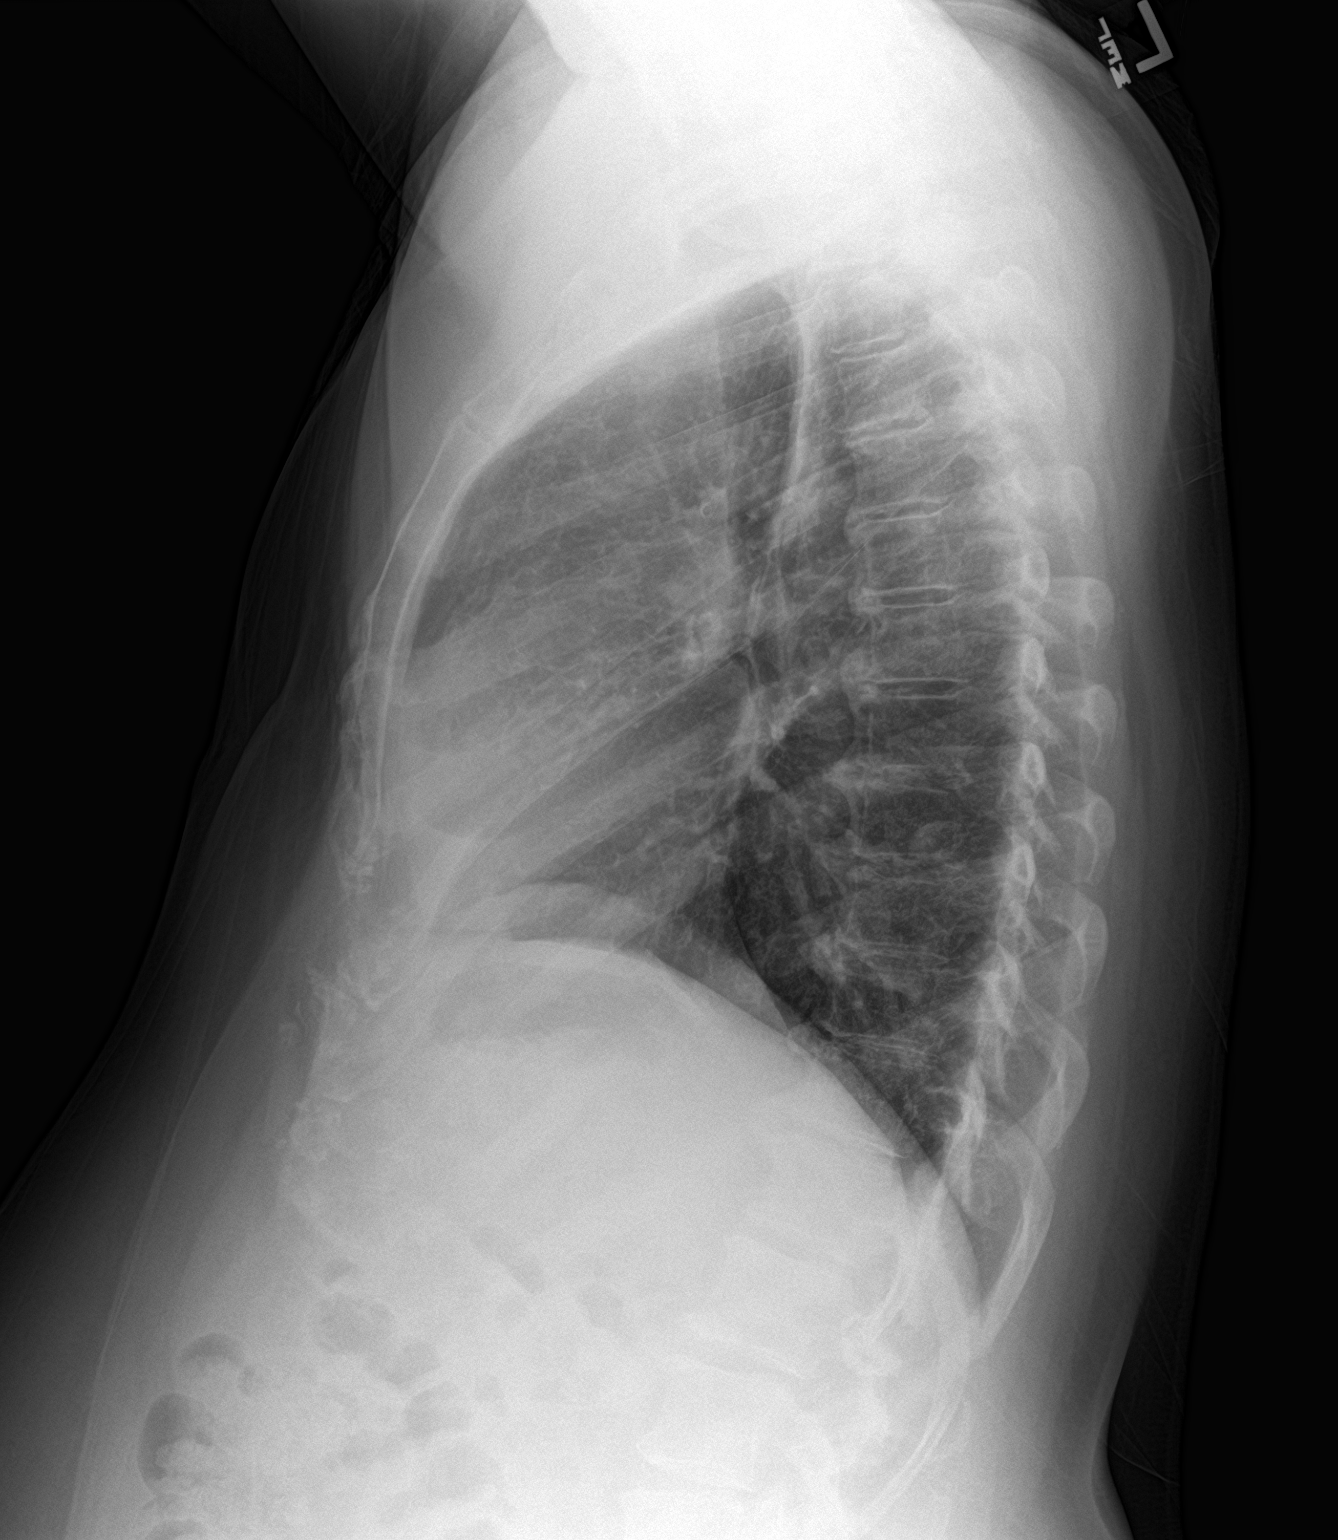

[2 of 2 positions shown; findings below may reference images not displayed]

FINDINGS: Normal heart size, mediastinal contours, and pulmonary vascularity.

Chronic peribronchial thickening.

No acute infiltrate, pleural effusion or pneumothorax.

Prior BILATERAL mastectomies and RIGHT axillary lymph node
dissection.

Scattered endplate spur formation throughout the thoracic spine.

Bones demineralized.
IMPRESSION: Chronic bronchitic changes.

No acute abnormalities.

## 2018-01-08 MED ORDER — FLUTICASONE FUROATE-VILANTEROL 100-25 MCG/INH IN AEPB: 1.0000 | INHALATION_SPRAY | Freq: Every day | RESPIRATORY_TRACT | 0 refills | Status: AC

## 2018-01-08 NOTE — Progress Notes (Signed)
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 -This may be due to a combination of Sjogren's syndrome effects of the lung as well as radiation fibrosis. - She notes that metered-dose inhaler of albuterol seems to help, I will give her a sample of Brio 100, she is asked to call back for prescription if she finds it useful.  I am also going to get a baseline chest x-ray today due to dyspnea on exertion.  Obstructive sleep apnea. -Currently doing well with CPAP at pressure of 8. -Continue using CPAP nightly.  Orders Placed This Encounter  Procedures  . DG Chest 2 View   Meds ordered this encounter  Medications  . fluticasone furoate-vilanterol (BREO ELLIPTA) 100-25 MCG/INH AEPB    Sig: Inhale 1 puff into the lungs daily for 1 day.    Dispense:  14 each    Refill:  0   Return in about 1 year (around 01/09/2019).   Date: 01/08/2018  MRN# [id number] Kristina Mccoy 12/02/1960    Kristina Mccoy is a 62 y.o. old female seen in consultation for chief complaint of:    Chief Complaint  Patient presents with  . Sleep Apnea    DME:[facility] wears cpap at least 5 hrs nightly  . Shortness of Breath    She states is better but she has has to use inhaler last 2 days allergies.    HPI:   Patient has a complex medical history of breast cancer, Sjorgen disease, fibromyalgia, obstructive sleep apnea, obesity, diabetes and hyperlipidemia. She was last seen here 3 years ago by Dr. Stevenson Clinch for OSA and dyspnea. 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 She mild dyspnea with exertion though she is not very active, no particular exacerbating factors. Sometimes she has to take deep breaths. She uses albuterol MDI on and off, she used it twice this past week, and sometimes she can go weeks without using it.   She has been doing well with CPAP. She got a new machine about a year ago after a repeat sleep study. She is using  cpap every night and is more awake during the day.      PFTs and mild restriction, RV 74, DLCO 61. Sleep study February 2016 AHI 14.3, AutoPap 5-15.  **Personally reviewed download data, 30 days as of 01/07/18.  Usage greater than 4 hours is 26/30 days.  Average usage on days used is 5 hours 50 minutes.  Set pressure is 8.  Residual AHI is 1.  This demonstrates very good compliance with excellent control of obstructive sleep apnea. **Sleep study, CPAP titration 04/09/16, CPAP was recommended at a pressure of 8.  **Imaging personally reviewed, 01/16/17; lungs unremarkable. **CBC 12/22/17, absolute eosinophil count 300.   PMHX:   Past Medical History:  Diagnosis Date  . Abdominal pain, epigastric   . Allergic urticaria   . Blood clotting disorder ([place])   . Breast cancer ([place]) 5/14   left breast  . Breast cancer ([place]) 02/2013   Dr Laurance Flatten ([facility])  . Diabetes mellitus   . Diverticulosis   . Fibromyalgia   . GERD (gastroesophageal reflux disease)   . Heart murmur   . Hyperlipidemia   . Lymphedema of arm    right  . Myalgia   . Myositis   . Neuromyositis   . Otitis media, chronic   . Pain syndrome, chronic   . Peptic ulcer   . Sicca syndrome (  [place])   . Sjogren's disease ([place])   . Sleep apnea   . SOB (shortness of breath)   . 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 BREAST SURGERY Bilateral    mastectomy  . COLONOSCOPY WITH PROPOFOL N/A 04/22/2017   Procedure: COLONOSCOPY WITH PROPOFOL;  Surgeon: Lollie Sails, MD;  Location: [facility] ENDOSCOPY;  Service: Endoscopy;  Laterality: N/A;  . ESOPHAGOGASTRODUODENOSCOPY (EGD) WITH PROPOFOL N/A 04/22/2017   Procedure: ESOPHAGOGASTRODUODENOSCOPY (EGD) WITH PROPOFOL;  Surgeon: Lollie Sails, MD;  Location: [facility] ENDOSCOPY;  Service: Endoscopy;  Laterality: N/A;  . MASTECTOMY Bilateral 03/23/13   Family Hx:  Family History  Problem Relation Age of Onset    . Diabetes Mother   . Heart disease Mother   . Hyperlipidemia Mother   . Heart failure Mother   . Diabetes Other   . Heart disease Brother   . Hyperlipidemia Brother   . Colon polyps Brother   . Stroke Maternal Grandmother   . Breast cancer Maternal Aunt   . Colon cancer Paternal Grandmother   . Ovarian cancer Neg Hx    Social Hx:   Social History   Tobacco Use  . Smoking status: Former Smoker    Packs/day: 0.50    Types: Cigarettes    Last attempt to quit: 02/28/2013    Years since quitting: 4.8  . Smokeless tobacco: Never Used  Substance Use Topics  . Alcohol use: No    Alcohol/week: 0.0 oz  . Drug use: No   Medication:    Current Outpatient Medications:  .  albuterol (PROVENTIL HFA;VENTOLIN HFA) 108 (90 Base) MCG/ACT inhaler, Inhale 2 puffs into the lungs every 6 (six) hours as needed for wheezing or shortness of breath., Disp: 1 Inhaler, Rfl: 0 .  ALPRAZolam (XANAX) 0.5 MG tablet, TAKE 1 TABLET BY MOUTH 4 TIMES A DAY AS NEEDED FOR ANXIETY, Disp: 120 tablet, Rfl: 1 .  cetirizine (ZYRTEC) 10 MG tablet, Take by mouth., Disp: , Rfl:  .  Cholecalciferol (VITAMIN D3) 1000 units CAPS, Take by mouth., Disp: , Rfl:  .  clobetasol ointment (TEMOVATE) 0.05 %, Apply topically., Disp: , Rfl:  .  Cyanocobalamin 1000 MCG CAPS, Take by mouth., Disp: , Rfl:  .  cyclobenzaprine (FLEXERIL) 10 MG tablet, Take 1 tablet (10 mg total) by mouth 3 (three) times daily as needed for muscle spasms., Disp: 90 tablet, Rfl: 2 .  cycloSPORINE (RESTASIS) 0.05 % ophthalmic emulsion, Place 1 drop into both eyes 4 (four) times daily., Disp: , Rfl:  .  dicyclomine (BENTYL) 10 MG capsule, Take 1 capsule (10 mg total) by mouth 3 (three) times daily as needed for spasms., Disp: 60 capsule, Rfl: 1 .  Emollient (CERAVE) CREA, Apply topically., Disp: , Rfl:  .  FREESTYLE LITE test strip, CHECK 6 TIMES DAILY, Disp: 100 each, Rfl: 3 .  glipiZIDE (GLUCOTROL XL) 2.5 MG 24 hr tablet, Take 1 tablet by mouth as needed.,  Disp: , Rfl:  .  glucose blood (ONE TOUCH ULTRA TEST) test strip, CHECK SIX TIMES DAILY, Disp: 100 each, Rfl: 11 .  hydrOXYzine (ATARAX/VISTARIL) 10 MG tablet, Take by mouth., Disp: , Rfl:  .  hyoscyamine (LEVSIN, ANASPAZ) 0.125 MG tablet, TAKE ONE TABLET BY MOUTH EVERY 4 HOURS AS NEEDED, Disp: 90 tablet, Rfl: 3 .  insulin aspart (NOVOLOG FLEXPEN) 100 UNIT/ML FlexPen, Take up to 2 - 4 units before meals up to 3 times daily,  Disp: , Rfl:  .  Insulin Syringe-Needle U-100 (INSULIN SYRINGE .5CC/31GX5/16") 31G X 5/16" 0.5 ML MISC, Use twice daily with injections, Disp: , Rfl:  .  ketoconazole (NIZORAL) 2 % cream, Apply 1 application topically 2 (two) times daily., Disp: , Rfl: 3 .  ketoconazole (NIZORAL) 2 % shampoo, Apply 1 application topically once a week., Disp: , Rfl: 11 .  Lancets MISC, Use. BAYER CONTOUR LANCETS, Disp: , Rfl:  .  nystatin cream (MYCOSTATIN), Apply 1 application topically as needed., Disp: , Rfl:  .  ondansetron (ZOFRAN) 4 MG tablet, TAKE 1 TABLET BY MOUTH EVERY 8 HOURS AS NEEDED FOR NAUSEA AND VOMITING, Disp: 20 tablet, Rfl: 0 .  ONETOUCH DELICA LANCETS 36R MISC, TWICE DAILY, Disp: 100 each, Rfl: 3 .  pantoprazole (PROTONIX) 40 MG tablet, Take 1 tablet (40 mg total) by mouth daily., Disp: 30 tablet, Rfl: 3 .  prochlorperazine (COMPAZINE) 10 MG tablet, Take 10 mg by mouth every 6 (six) hours as needed., Disp: , Rfl:  .  RABEprazole (ACIPHEX) 20 MG tablet, Take 20 mg by mouth daily as needed., Disp: , Rfl:  .  ranitidine (ZANTAC) 150 MG tablet, Take 150 mg by mouth every evening., Disp: , Rfl: 11 .  sucralfate (CARAFATE) 1 g tablet, Take 1 g by mouth 4 (four) times daily -  with meals and at bedtime. , Disp: , Rfl:  .  tamoxifen (NOLVADEX) 20 MG tablet, Take 20 mg by mouth daily., Disp: , Rfl:  .  tiZANidine (ZANAFLEX) 2 MG tablet, TAKE ONE TABLET BY MOUTH EVERY 8 HOURS AS NEEDED FOR HEADACHES, Disp: 60 tablet, Rfl: 0 .  traMADol (ULTRAM) 50 MG tablet, TAKE 1 TABLET BY MOUTH EVERY  6 HOURS AS NEEDED FOR PAIN, Disp: 120 tablet, Rfl: 0   Allergies:  Gabapentin; Oxycodone; Carbamazepine; Dulaglutide; Insulin glargine; Metformin; Methylprednisolone; Morphine; Pregabalin; Venlafaxine; Buprenorphine; Canagliflozin; and Hydroxychloroquine  Review of Systems: Gen:  Denies  fever, sweats, chills HEENT: Denies blurred vision, double vision. bleeds, sore throat Cvc:  No dizziness, chest pain. Resp:   Denies cough or sputum production, shortness of breath Gi: Denies swallowing difficulty, stomach pain. Gu:  Denies bladder incontinence, burning urine Ext:   No Joint pain, stiffness. Skin: No skin rash,  hives  Endoc:  No polyuria, polydipsia. Psych: No depression, insomnia. Other:  All other systems were reviewed with the patient and were negative other that what is mentioned in the HPI.   Physical Examination:   VS: BP 110/80 (BP Location: Left Arm, Cuff Size: Normal)   Pulse 81   Resp 16   Ht 5\' 4"  (1.626 m)   Wt 169 lb (76.7 kg)   SpO2 100%   BMI 29.01 kg/m   General Appearance: No distress  Neuro:without focal findings,  speech normal,  HEENT: PERRLA, EOM intact.   Pulmonary: normal breath sounds, No wheezing.  CardiovascularNormal S1,S2.  No m/r/g.   Abdomen: Benign, Soft, non-tender. Renal:  No costovertebral tenderness  GU:  No performed at this time. Endoc: No evident thyromegaly, no signs of acromegaly. Skin:   warm, no rashes, no ecchymosis  Extremities: normal, no cyanosis, clubbing.  Other findings:    LABORATORY PANEL:   CBC No results for input(s): WBC, HGB, HCT, PLT in the last 168 hours. ------------------------------------------------------------------------------------------------------------------  Chemistries  No results for input(s): NA, K, CL, CO2, GLUCOSE, BUN, CREATININE, CALCIUM, MG, AST, ALT, ALKPHOS, BILITOT in the last 168 hours.  Invalid input(s):  GFRCGP ------------------------------------------------------------------------------------------------------------------  Cardiac Enzymes No results for  input(s): TROPONINI in the last 168 hours. ------------------------------------------------------------  RADIOLOGY:  No results found.     Thank  you for the consultation and for allowing [facility] to assist in the care of your patient. Our recommendations are noted above.  Please contact us if we can be of further service.   Marda Stalker, MD.  Board Certified in Internal Medicine, Pulmonary Medicine, [place], and Sleep Medicine.  [facility] Office Number: [phone]  Patricia Pesa, M.D.  Merton Border, M.D  01/08/2018

## 2018-01-08 NOTE — Patient Instructions (Signed)
Continue using CPAP every night.   Call us if you have change in your respiratory status.

## 2018-01-15 ENCOUNTER — Encounter: Payer: Self-pay | Admitting: *Deleted

## 2018-01-20 ENCOUNTER — Ambulatory Visit: Payer: PPO

## 2018-01-26 ENCOUNTER — Encounter: Payer: Self-pay | Admitting: Family Medicine

## 2018-01-26 ENCOUNTER — Ambulatory Visit (INDEPENDENT_AMBULATORY_CARE_PROVIDER_SITE_OTHER): Payer: PPO | Admitting: Family Medicine

## 2018-01-26 DIAGNOSIS — K743 Primary biliary cirrhosis: Secondary | ICD-10-CM | POA: Diagnosis not present

## 2018-01-26 DIAGNOSIS — M3500 Sicca syndrome, unspecified: Secondary | ICD-10-CM

## 2018-01-26 DIAGNOSIS — F419 Anxiety disorder, unspecified: Secondary | ICD-10-CM | POA: Diagnosis not present

## 2018-01-26 DIAGNOSIS — J309 Allergic rhinitis, unspecified: Secondary | ICD-10-CM | POA: Insufficient documentation

## 2018-01-26 MED ORDER — ESCITALOPRAM OXALATE 10 MG PO TABS: 10.0000 mg | ORAL_TABLET | Freq: Every day | ORAL | 2 refills | Status: DC

## 2018-01-26 NOTE — Progress Notes (Signed)
  Kristina Rumps, MD Phone: [phone]  Kristina Mccoy is a 62 y.o. female who presents today for f/u.  Anxiety: Continues to have issues with anxiety.  Notes she has a lot of stress when she has to interact with people.  Just the slightest things put her into a panic at times.  She takes Xanax 4 times a day.  Helps take the edge off.  She does not drink alcohol.  No drowsiness with this.  She would be willing to try an alternative medication as well.  Allergic rhinitis: Notes sneezing and congestion starting over the weekend.  Some rhinorrhea.  Notes her ears hurt a little bit.  No fevers.  Blowing clear mucus out of her nose.  She does have chronic pain for which the tramadol is beneficial.  Joint and body pain related to her Sjogren's.  Lots of exhaustion related to this as well.  The tramadol does not make her drowsy.  She is following with GI for possible primary biliary cirrhosis and elevated LFTs.  She notes they are going to get a gastric emptying study.  She had repeat labs which did reveal improved LFTs.  They have recommended liver biopsy.  Social History   Tobacco Use  Smoking Status Former Smoker  . Packs/day: 0.50  . Types: Cigarettes  . Last attempt to quit: 02/28/2013  . Years since quitting: 4.9  Smokeless Tobacco Never Used     ROS see history of present illness  Objective  Physical Exam Vitals:   01/26/18 1342  BP: 120/70  Pulse: 74  Temp: 97.6 F (36.4 C)  SpO2: 98%    BP Readings from Last 3 Encounters:  01/26/18 120/70  01/08/18 110/80  12/22/17 112/64   Wt Readings from Last 3 Encounters:  01/26/18 169 lb 12.8 oz (77 kg)  01/08/18 169 lb (76.7 kg)  12/22/17 169 lb 6.4 oz (76.8 kg)    Physical Exam  Constitutional: No distress.  HENT:  Head: Normocephalic and atraumatic.  Mouth/Throat: Oropharynx is clear and moist. No oropharyngeal exudate.  Cardiovascular: Normal rate, regular rhythm and normal heart sounds.  Pulmonary/Chest: Effort  normal and breath sounds normal.  Abdominal: Soft. Bowel sounds are normal. She exhibits no distension and no mass. There is tenderness (Slight right upper quadrant tenderness that is chronic). There is no rebound and no guarding.  Musculoskeletal: She exhibits no edema.  Neurological: She is alert.  Skin: Skin is warm and dry. She is not diaphoretic.     Assessment/Plan: Please see individual problem list.  Allergic rhinitis Symptoms consistent with allergic rhinitis.  She will trial Flonase.  Primary biliary cirrhosis (HCC) LFTs stabilized with GI.  I encouraged her to contact them to see what the next step in evaluation was.  Sjogren's syndrome ([place]) This is likely causing most of her pain.  She will continue to follow with rheumatology.  Continue tramadol for discomfort.  Anxiety Continues to have issues with this.  We will start her on Lexapro.   No orders of the defined types were placed in this encounter.   Meds ordered this encounter  Medications  . escitalopram (LEXAPRO) 10 MG tablet    Sig: Take 1 tablet (10 mg total) by mouth daily.    Dispense:  30 tablet    Refill:  2     Kristina Rumps, MD [place]

## 2018-01-26 NOTE — Assessment & Plan Note (Signed)
LFTs stabilized with GI.  I encouraged her to contact them to see what the next step in evaluation was.

## 2018-01-26 NOTE — Patient Instructions (Signed)
Nice to see you. We will try you on Lexapro to see if that helps with your anxiety. Please try Flonase over-the-counter for your allergy symptoms. Please contact your GI office to see what they determined would be the next step for your liver.

## 2018-01-26 NOTE — Assessment & Plan Note (Signed)
This is likely causing most of her pain.  She will continue to follow with rheumatology.  Continue tramadol for discomfort.

## 2018-01-26 NOTE — Assessment & Plan Note (Signed)
Continues to have issues with this.  We will start her on Lexapro.

## 2018-01-26 NOTE — Assessment & Plan Note (Signed)
Symptoms consistent with allergic rhinitis.  She will trial Flonase.

## 2018-01-28 ENCOUNTER — Telehealth: Payer: Self-pay

## 2018-01-28 NOTE — Telephone Encounter (Signed)
Left message to notify patient forms are ready at front desk to be picked up.

## 2018-01-30 ENCOUNTER — Encounter: Payer: Self-pay | Admitting: Family Medicine

## 2018-02-02 ENCOUNTER — Other Ambulatory Visit
Admission: RE | Admit: 2018-02-02 | Discharge: 2018-02-02 | Disposition: A | Discharge status: Home / Self Care | Payer: PPO | Source: Ambulatory Visit | Attending: Pulmonary Disease | Admitting: Pulmonary Disease

## 2018-02-02 ENCOUNTER — Telehealth: Payer: Self-pay

## 2018-02-02 ENCOUNTER — Ambulatory Visit
Admission: RE | Admit: 2018-02-02 | Discharge: 2018-02-02 | Disposition: A | Discharge status: Home / Self Care | Payer: PPO | Source: Ambulatory Visit | Attending: Pulmonary Disease | Admitting: Pulmonary Disease

## 2018-02-02 ENCOUNTER — Encounter: Payer: Self-pay | Admitting: Pulmonary Disease

## 2018-02-02 ENCOUNTER — Ambulatory Visit: Payer: PPO | Admitting: Pulmonary Disease

## 2018-02-02 ENCOUNTER — Telehealth: Payer: Self-pay | Admitting: *Deleted

## 2018-02-02 VITALS — BP 112/60 | HR 74 | Ht 64.0 in | Wt 167.0 lb

## 2018-02-02 DIAGNOSIS — R079 Chest pain, unspecified: Secondary | ICD-10-CM

## 2018-02-02 DIAGNOSIS — G4733 Obstructive sleep apnea (adult) (pediatric): Secondary | ICD-10-CM | POA: Diagnosis not present

## 2018-02-02 DIAGNOSIS — Z122 Encounter for screening for malignant neoplasm of respiratory organs: Secondary | ICD-10-CM

## 2018-02-02 DIAGNOSIS — R0602 Shortness of breath: Secondary | ICD-10-CM

## 2018-02-02 DIAGNOSIS — M3500 Sicca syndrome, unspecified: Secondary | ICD-10-CM

## 2018-02-02 DIAGNOSIS — R203 Hyperesthesia: Secondary | ICD-10-CM | POA: Diagnosis not present

## 2018-02-02 DIAGNOSIS — Z87891 Personal history of nicotine dependence: Secondary | ICD-10-CM

## 2018-02-02 LAB — FIBRIN DERIVATIVES D-DIMER (ARMC ONLY): Fibrin derivatives D-dimer (ARMC): 395.52 ng/mL (FEU) (ref 0.00–499.00)

## 2018-02-02 MED ORDER — LIDOCAINE 1.8 % EX PTCH: 1.0000 | MEDICATED_PATCH | Freq: Two times a day (BID) | CUTANEOUS | 1 refills | Status: DC | PRN

## 2018-02-02 MED ORDER — FLUTICASONE FUROATE-VILANTEROL 100-25 MCG/INH IN AEPB: 1.0000 | INHALATION_SPRAY | Freq: Every day | RESPIRATORY_TRACT | 0 refills | Status: DC

## 2018-02-02 NOTE — Telephone Encounter (Signed)
Called patient to triage and she states that since Wed or Thursday she has been having some shortness of breath and has been using her inhaler more often. She states that she feels pain in her back located where her lungs are. She feels it more when ever she is sitting or laying down and it is painful to the touch but there is no bruising ,no fever, or cough but there is some discomfort when she takes a deep breath. She says this is similar to when she had pneumonia a few years ago. There is a same day on 02-06-18 would you like her scheduled on that day?

## 2018-02-02 NOTE — Telephone Encounter (Signed)
She really should be seen today given her symptoms. Please offer her an appointment today if there is one available or advise evaluation at the walk in clinic or urgent care. Thanks.

## 2018-02-02 NOTE — Telephone Encounter (Signed)
No appointments available today, per Dr. Ellen Henri recommendation I informed the patient that he would like her seen today and that she should go to a walk in ( suggested [facility]) or an urgent care. Patient verbalized understanding of this and states that she will go to [facility].

## 2018-02-02 NOTE — Telephone Encounter (Signed)
Received referral for initial lung cancer screening scan. Contacted patient and obtained smoking history,(former, quit 2014, 30 pack year) as well as answering questions related to screening process. Patient denies signs of lung cancer such as weight loss or hemoptysis. Patient denies comorbidity that would prevent curative treatment if lung cancer were found. Patient is scheduled for shared decision making visit and CT scan on 02/12/18.

## 2018-02-02 NOTE — Patient Instructions (Addendum)
Blood test today: d-dimer Chest x-ray today Breo inhaler has been ordered Continue albuterol rescue inhaler as needed Lidoderm patch has been ordered and may be used as needed over area of pain After review of chest x-ray, further evaluation and management will be determined Follow-up with Dr. Ashby Dawes in 3 to 4 weeks with repeat pulmonary function tests prior

## 2018-02-03 ENCOUNTER — Telehealth: Payer: Self-pay | Admitting: *Deleted

## 2018-02-03 ENCOUNTER — Telehealth: Payer: Self-pay | Admitting: Pulmonary Disease

## 2018-02-03 MED ORDER — PREDNISONE 20 MG PO TABS: 40.0000 mg | ORAL_TABLET | Freq: Every day | ORAL | 0 refills | Status: AC

## 2018-02-03 NOTE — Telephone Encounter (Signed)
Pt informed. Nothing further needed. 

## 2018-02-03 NOTE — Telephone Encounter (Signed)
Let her know CXR and D-dimer are normal. I have placed order for prednisone 40 mg daily for 5 days  Waunita Schooner

## 2018-02-03 NOTE — Telephone Encounter (Signed)
PA for Lidocaine patches submitted via covermymeds.

## 2018-02-03 NOTE — Telephone Encounter (Signed)
Patient wants results from labs and xray.  please call.

## 2018-02-03 NOTE — Telephone Encounter (Signed)
Pt request results for labs and CXR.

## 2018-02-04 ENCOUNTER — Other Ambulatory Visit: Payer: Self-pay | Admitting: Family Medicine

## 2018-02-04 NOTE — Telephone Encounter (Signed)
Last OV 01/26/18 last filled  Tramadol  12/25/17 120 0rf Alprazolam 12/04/17 120 1rf

## 2018-02-05 NOTE — Progress Notes (Signed)
ACUTE PULMONARY OFFICE VISIT  CHRONIC PULMONARY PROBLEMS: Chronic DOE -previously responsive to albuterol.  Recently prescribed Breo Restrictive pulmonary physiology Sjogren's syndrome (manifests as dry eyes, fatigue, chronic pain) OSA   ACUTE PROBLEM: Dyspnea Posterior thoracic pain  SUBJ: Patient of DR with chronic problems as noted above.  Seen today as acute and on with 4-day history of increased shortness of breath, bilateral posterior thoracic pain.  This pain is constant and is not pleuritic in nature.  She feels that her skin is extremely sensitive to light touch.  She denies fever, cough, hemoptysis, lower extremity edema, calf tenderness.  Her dyspnea is not improved with albuterol inhaler.  OBJ: Vitals:   02/02/18 1444 02/02/18 1450  BP:  112/60  Pulse:  74  SpO2:  99%  Weight: 167 lb (75.8 kg)   Height: 5\' 4"  (1.626 m)   Room air  Gen: NAD HEENT: NCAT, sclera white Neck: No JVD Lungs: breath sounds full, no wheezes or other adventitious sounds Cardiovascular: RRR, no murmurs Abdomen: Soft, nontender, normal BS Ext: without clubbing, cyanosis, edema Neuro: No focal deficits.  Reports pain to light touch over right lower hemithorax posteriorly. Skin: No rashes noted.  BMP Latest Ref Rng & Units 12/22/2017 10/28/2017 01/22/2017  Glucose 70 - 99 mg/dL 136(H) 148(H) 141(H)  BUN 6 - 23 mg/dL 8 12 19   Creatinine 0.40 - 1.20 mg/dL 0.81 0.76 0.87  Sodium 135 - 145 mEq/L 135 137 132(L)  Potassium 3.5 - 5.1 mEq/L 4.1 3.7 MODERATE HEMOLYSIS  Chloride 96 - 112 mEq/L 98 101 99(L)  CO2 19 - 32 mEq/L 29 30 23   Calcium 8.4 - 10.5 mg/dL 9.7 9.4 8.5(L)    CBC Latest Ref Rng & Units 12/22/2017 04/22/2017 08/07/2016  WBC 4.0 - 10.5 K/uL 6.1 11.8(H) 6.2  Hemoglobin 12.0 - 15.0 g/dL 14.7 14.4 14.3  Hematocrit 36.0 - 46.0 % 42.7 42.6 42.1  Platelets 150.0 - 400.0 K/uL 190.0 193 233.0     CXR 02/02/2018: No acute cardiac or pulmonary findings   I have personally reviewed all  chest radiographs reported above including CXRs and CT chest unless otherwise indicated  IMPRESSION: Sjogren's syndrome - Plan: Pulmonary Function Test [facility] Only  OSA  Shortness of breath - Plan: Pulmonary Function Test [facility] Only, DG Chest 2 View, D-Dimer, Quantitative  Posterior thoracic pain - Plan: DG Chest 2 View, D-Dimer, Quantitative  Hyperesthesia   Etiology of new symptoms is unclear but might be related to her connective tissue disorder.  PLAN:   1) d-dimer was ordered on the day of this encounter.  This was in the normal range. 2) chest x-ray was ordered on the day of this encounter.  This revealed no abnormalities. 3) she requested that the Breo inhaler (which was provided as a sample) be placed as a prescription.  This was done.  She is to continue this medication as previously prescribed. 4) continue albuterol rescue inhaler as previously prescribed 5) recommended Lidoderm patch to be placed over area of discomfort.  Prescription has been entered. 6) after review of the d-dimer and chest x-ray, I entered a prescription for prednisone 40 mg daily for 5 days 7) she is to follow-up with Dr. Ashby Dawes in 3 to 4 weeks with repeat pulmonary function test prior to that encounter  I have reviewed this patient's medical problems, current medications and therapies and prior pulmonary office notes in evaluation and formulation of the above assessment and plan    Merton Border, MD PCCM service Mobile [phone]  Pager [phone] 02/05/2018 9:10 AM

## 2018-02-12 ENCOUNTER — Inpatient Hospital Stay: Payer: PPO | Attending: Nurse Practitioner | Admitting: Nurse Practitioner

## 2018-02-12 ENCOUNTER — Encounter: Payer: Self-pay | Admitting: Nurse Practitioner

## 2018-02-12 ENCOUNTER — Ambulatory Visit
Admission: RE | Admit: 2018-02-12 | Discharge: 2018-02-12 | Disposition: A | Discharge status: Home / Self Care | Payer: PPO | Source: Ambulatory Visit | Attending: Nurse Practitioner | Admitting: Nurse Practitioner

## 2018-02-12 DIAGNOSIS — Z87891 Personal history of nicotine dependence: Secondary | ICD-10-CM

## 2018-02-12 DIAGNOSIS — Z122 Encounter for screening for malignant neoplasm of respiratory organs: Secondary | ICD-10-CM | POA: Insufficient documentation

## 2018-02-12 NOTE — Progress Notes (Signed)
In accordance with CMS guidelines, patient has met eligibility criteria including age, absence of signs or symptoms of lung cancer.  Social History   Tobacco Use  . Smoking status: Former Smoker    Packs/day: 1.00    Years: 30.00    Pack years: 30.00    Types: Cigarettes    Last attempt to quit: 02/28/2013    Years since quitting: 4.9  . Smokeless tobacco: Never Used  Substance Use Topics  . Alcohol use: No    Alcohol/week: 0.0 oz  . Drug use: No      A shared decision-making session was conducted prior to the performance of CT scan. This includes one or more decision aids, includes benefits and harms of screening, follow-up diagnostic testing, over-diagnosis, false positive rate, and total radiation exposure.   Counseling on the importance of adherence to annual lung cancer LDCT screening, impact of co-morbidities, and ability or willingness to undergo diagnosis and treatment is imperative for compliance of the program.   Counseling on the importance of continued smoking cessation for former smokers; the importance of smoking cessation for current smokers, and information about tobacco cessation interventions have been given to patient including [place] and [facility] quit [place] programs.   Written order for lung cancer screening with LDCT has been given to the patient and any and all questions have been answered to the best of my abilities.    Yearly follow up will be coordinated by Burgess Estelle, Thoracic Navigator.  Beckey Rutter, DNP, AGNP-C [facility] [phone] (work cell) [phone] (office) 02/12/18 2:51 PM

## 2018-02-13 ENCOUNTER — Encounter: Payer: Self-pay | Admitting: *Deleted

## 2018-02-16 ENCOUNTER — Encounter: Payer: Self-pay | Admitting: Family Medicine

## 2018-02-16 DIAGNOSIS — Z780 Asymptomatic menopausal state: Secondary | ICD-10-CM

## 2018-02-19 ENCOUNTER — Ambulatory Visit: Payer: PPO

## 2018-02-20 DIAGNOSIS — N898 Other specified noninflammatory disorders of vagina: Secondary | ICD-10-CM | POA: Diagnosis not present

## 2018-02-20 DIAGNOSIS — N904 Leukoplakia of vulva: Secondary | ICD-10-CM | POA: Diagnosis not present

## 2018-02-20 DIAGNOSIS — Z01419 Encounter for gynecological examination (general) (routine) without abnormal findings: Secondary | ICD-10-CM | POA: Diagnosis not present

## 2018-02-20 DIAGNOSIS — N951 Menopausal and female climacteric states: Secondary | ICD-10-CM | POA: Diagnosis not present

## 2018-02-24 ENCOUNTER — Other Ambulatory Visit: Payer: Self-pay | Admitting: Family Medicine

## 2018-02-27 DIAGNOSIS — L578 Other skin changes due to chronic exposure to nonionizing radiation: Secondary | ICD-10-CM | POA: Diagnosis not present

## 2018-03-02 ENCOUNTER — Ambulatory Visit: Payer: PPO | Admitting: Internal Medicine

## 2018-03-05 ENCOUNTER — Ambulatory Visit: Payer: PPO

## 2018-03-06 ENCOUNTER — Ambulatory Visit: Payer: PPO

## 2018-03-06 DIAGNOSIS — M797 Fibromyalgia: Secondary | ICD-10-CM | POA: Diagnosis not present

## 2018-03-06 DIAGNOSIS — M792 Neuralgia and neuritis, unspecified: Secondary | ICD-10-CM | POA: Diagnosis not present

## 2018-03-06 DIAGNOSIS — M7632 Iliotibial band syndrome, left leg: Secondary | ICD-10-CM | POA: Diagnosis not present

## 2018-03-06 DIAGNOSIS — M3500 Sicca syndrome, unspecified: Secondary | ICD-10-CM | POA: Diagnosis not present

## 2018-03-06 DIAGNOSIS — M7631 Iliotibial band syndrome, right leg: Secondary | ICD-10-CM | POA: Diagnosis not present

## 2018-03-09 ENCOUNTER — Ambulatory Visit
Admission: RE | Admit: 2018-03-09 | Discharge: 2018-03-09 | Disposition: A | Discharge status: Home / Self Care | Payer: PPO | Source: Ambulatory Visit | Attending: Family Medicine | Admitting: Family Medicine

## 2018-03-09 DIAGNOSIS — Z780 Asymptomatic menopausal state: Secondary | ICD-10-CM | POA: Diagnosis not present

## 2018-03-16 DIAGNOSIS — K760 Fatty (change of) liver, not elsewhere classified: Secondary | ICD-10-CM | POA: Diagnosis not present

## 2018-03-16 DIAGNOSIS — R945 Abnormal results of liver function studies: Secondary | ICD-10-CM | POA: Diagnosis not present

## 2018-03-16 DIAGNOSIS — J029 Acute pharyngitis, unspecified: Secondary | ICD-10-CM | POA: Diagnosis not present

## 2018-03-16 DIAGNOSIS — R1013 Epigastric pain: Secondary | ICD-10-CM | POA: Diagnosis not present

## 2018-03-24 ENCOUNTER — Other Ambulatory Visit: Payer: Self-pay | Admitting: Family Medicine

## 2018-03-24 NOTE — Telephone Encounter (Signed)
Last OV 01/26/18 last filled 02/04/18 120 0rf

## 2018-03-25 NOTE — Telephone Encounter (Signed)
Sent to pharmacy.  Drug database reviewed.  No red flags.

## 2018-03-30 ENCOUNTER — Ambulatory Visit: Payer: PPO

## 2018-04-07 ENCOUNTER — Other Ambulatory Visit: Payer: Self-pay | Admitting: Family Medicine

## 2018-04-07 NOTE — Telephone Encounter (Signed)
Last Ov 01/26/18 last filled 02/04/18 120 1rf

## 2018-04-07 NOTE — Telephone Encounter (Signed)
Drug database reviewed.  Sent to pharmacy. 

## 2018-04-10 ENCOUNTER — Encounter: Admission: RE | Admit: 2018-04-10 | Payer: PPO | Source: Ambulatory Visit

## 2018-04-27 ENCOUNTER — Ambulatory Visit (INDEPENDENT_AMBULATORY_CARE_PROVIDER_SITE_OTHER): Payer: PPO | Admitting: Family Medicine

## 2018-04-27 ENCOUNTER — Encounter: Payer: Self-pay | Admitting: Family Medicine

## 2018-04-27 VITALS — BP 128/70 | HR 71 | Temp 97.8°F | Ht 64.0 in | Wt 164.8 lb

## 2018-04-27 DIAGNOSIS — R413 Other amnesia: Secondary | ICD-10-CM

## 2018-04-27 DIAGNOSIS — E785 Hyperlipidemia, unspecified: Secondary | ICD-10-CM

## 2018-04-27 DIAGNOSIS — F419 Anxiety disorder, unspecified: Secondary | ICD-10-CM | POA: Diagnosis not present

## 2018-04-27 DIAGNOSIS — J989 Respiratory disorder, unspecified: Secondary | ICD-10-CM | POA: Diagnosis not present

## 2018-04-27 DIAGNOSIS — E1165 Type 2 diabetes mellitus with hyperglycemia: Secondary | ICD-10-CM

## 2018-04-27 DIAGNOSIS — M7630 Iliotibial band syndrome, unspecified leg: Secondary | ICD-10-CM | POA: Diagnosis not present

## 2018-04-27 LAB — LIPID PANEL
Cholesterol: 215 mg/dL — ABNORMAL HIGH (ref 0–200)
HDL: 48 mg/dL (ref >39.00)
LDL Cholesterol: 144 mg/dL — ABNORMAL HIGH (ref 0–99)
NonHDL: 167.39
Total CHOL/HDL Ratio: 4
Triglycerides: 118 mg/dL (ref 0.0–149.0)
VLDL: 23.6 mg/dL (ref 0.0–40.0)

## 2018-04-27 LAB — COMPREHENSIVE METABOLIC PANEL
ALT: 28 U/L (ref 0–35)
AST: 22 U/L (ref 0–37)
Albumin: 4 g/dL (ref 3.5–5.2)
Alkaline Phosphatase: 98 U/L (ref 39–117)
BUN: 13 mg/dL (ref 6–23)
CO2: 28 mEq/L (ref 19–32)
Calcium: 9.3 mg/dL (ref 8.4–10.5)
Chloride: 100 mEq/L (ref 96–112)
Creatinine, Ser: 0.81 mg/dL (ref 0.40–1.20)
GFR: 76.59 mL/min (ref >60.00)
Glucose, Bld: 246 mg/dL — ABNORMAL HIGH (ref 70–99)
Potassium: 3.7 mEq/L (ref 3.5–5.1)
Sodium: 136 mEq/L (ref 135–145)
Total Bilirubin: 0.4 mg/dL (ref 0.2–1.2)
Total Protein: 7.5 g/dL (ref 6.0–8.3)

## 2018-04-27 LAB — HEMOGLOBIN A1C: Hgb A1c MFr Bld: 9.4 % — ABNORMAL HIGH (ref 4.6–6.5)

## 2018-04-27 NOTE — Assessment & Plan Note (Signed)
Check A1c.  She needs to follow-up with endocrinology.

## 2018-04-27 NOTE — Progress Notes (Signed)
[name], MD Phone: [phone]  Kristina Mccoy is a 62 y.o. female who presents today for f/u.  CC: dm, IT band syndrome, anxiety/depression, memory difficulty  Diabetes: Average CBGs over the last 7 days is 203.  She is only on NovoLog with meals.  she does see endocrinology though her endocrinologist moved and she does not have follow-up set up yet with them.  No change in polyuria or polydipsia.  She did have some low sugars when she was taking NovoLog in addition to the glipizide.  She discontinued glipizide.  IT band syndrome: She saw rheumatology and was noted to have this and possible trochanteric bursitis.  They felt as though her fibromyalgia may be playing a role.  They referred her to physical therapy though she would like to see somebody locally.  She reports an episode of illness with fever of 102 F and cough, sinus congestion, and sneezing in June.  She reports strep and flu testing were negative.  No antibiotics were given.  She has improved from this.  Anxiety: Notes this is somewhat better with her support group.  She takes Xanax 4 times a day and this does not make her drowsy.  She does not drink alcohol.  She notes she might be a little depressed about her health.  She tried the Lexapro though she stopped it as it caused a rash on her face.  She notes no SI.  She reports long-term memory issues that may have worsened recently.  She reports people have been telling her she occasionally repeats herself and drops words.  She forgets short-term issues.  She feels like she is in a fog.  She saw neurology years ago and states they advised that some neurologist may say that she has MS.  Social History   Tobacco Use  Smoking Status Former Smoker  . Packs/day: 1.00  . Years: 30.00  . Pack years: 30.00  . Types: Cigarettes  . Last attempt to quit: 02/28/2013  . Years since quitting: 5.1  Smokeless Tobacco Never Used     ROS see history of present  illness  Objective  Physical Exam Vitals:   04/27/18 1336  BP: 128/70  Pulse: 71  Temp: 97.8 F (36.6 C)  SpO2: 97%    BP Readings from Last 3 Encounters:  04/27/18 128/70  02/02/18 112/60  01/26/18 120/70   Wt Readings from Last 3 Encounters:  04/27/18 164 lb 12.8 oz (74.8 kg)  02/12/18 168 lb (76.2 kg)  02/02/18 167 lb (75.8 kg)    Physical Exam  Constitutional: No distress.  Cardiovascular: Normal rate, regular rhythm and normal heart sounds.  Pulmonary/Chest: Effort normal and breath sounds normal.  Musculoskeletal: She exhibits no edema.  Slight tenderness over bilateral trochanteric bursa and IT band areas  Neurological: She is alert.  Skin: Skin is warm and dry. She is not diaphoretic.     Assessment/Plan: Please see individual problem list.  Diabetes type 2, uncontrolled (HCC) Check A1c.  She needs to follow-up with endocrinology.  Hyperlipidemia Check lipid panel.  Anxiety Somewhat better.  She will continue Xanax as needed.  She does not drive when she takes this.  Drowsy precautions given.  She will continue to go to her support group.  Currently in a good place with regards to this.  We will hold off on adding additional medications given her multiple veggies and intolerances.  Iliotibial band syndrome Refer for physical therapy.  Respiratory illness This issue has resolved.  She will  monitor for recurrence.  Memory difficulty Potentially could be related to her rheumatologic issues and fatigue though we will refer to neurology for evaluation.    Orders Placed This Encounter  Procedures  . Comp Met (CMET)  . Lipid panel  . HgB A1c  . Ambulatory referral to Neurology    Referral Priority:   Routine    Referral Type:   Consultation    Referral Reason:   Specialty Services Required    Requested Specialty:   Neurology    Number of Visits Requested:   1  . Ambulatory referral to Physical Therapy    Referral Priority:   Routine    Referral  Type:   Physical Medicine    Referral Reason:   Specialty Services Required    Requested Specialty:   Physical Therapy    Number of Visits Requested:   1    No orders of the defined types were placed in this encounter.    [name], MD [place]

## 2018-04-27 NOTE — Assessment & Plan Note (Signed)
Potentially could be related to her rheumatologic issues and fatigue though we will refer to neurology for evaluation.

## 2018-04-27 NOTE — Assessment & Plan Note (Signed)
Check lipid panel  

## 2018-04-27 NOTE — Assessment & Plan Note (Signed)
Refer for physical therapy  

## 2018-04-27 NOTE — Assessment & Plan Note (Addendum)
Somewhat better.  She will continue Xanax as needed.  She does not drive when she takes this.  Drowsy precautions given.  She will continue to go to her support group.  Currently in a good place with regards to this.  We will hold off on adding additional medications given her multiple veggies and intolerances.

## 2018-04-27 NOTE — Patient Instructions (Addendum)
Nice to see you. We will get lab work today and contact you with results. I have referred you to neurology. I have also referred for physical therapy. You need to contact your endocrinologist for follow-up.

## 2018-04-27 NOTE — Assessment & Plan Note (Signed)
This issue has resolved.  She will monitor for recurrence.

## 2018-05-04 ENCOUNTER — Encounter: Payer: Self-pay | Admitting: Family Medicine

## 2018-05-04 DIAGNOSIS — E785 Hyperlipidemia, unspecified: Secondary | ICD-10-CM

## 2018-05-05 MED ORDER — ROSUVASTATIN CALCIUM 20 MG PO TABS: 20.0000 mg | ORAL_TABLET | Freq: Every day | ORAL | 3 refills | Status: DC

## 2018-05-15 DIAGNOSIS — Z794 Long term (current) use of insulin: Secondary | ICD-10-CM | POA: Diagnosis not present

## 2018-05-15 DIAGNOSIS — E1169 Type 2 diabetes mellitus with other specified complication: Secondary | ICD-10-CM | POA: Diagnosis not present

## 2018-05-15 DIAGNOSIS — E785 Hyperlipidemia, unspecified: Secondary | ICD-10-CM | POA: Diagnosis not present

## 2018-05-15 DIAGNOSIS — E1165 Type 2 diabetes mellitus with hyperglycemia: Secondary | ICD-10-CM | POA: Diagnosis not present

## 2018-05-21 DIAGNOSIS — N904 Leukoplakia of vulva: Secondary | ICD-10-CM | POA: Diagnosis not present

## 2018-05-22 ENCOUNTER — Encounter: Payer: Self-pay | Admitting: Family Medicine

## 2018-05-25 NOTE — Telephone Encounter (Signed)
Left message to return call, ok for pec to speak to patient and schedule her for an appointment tomorrow 05/26/18 in a 20 min slot.

## 2018-05-26 ENCOUNTER — Ambulatory Visit: Payer: PPO | Admitting: Family Medicine

## 2018-05-27 ENCOUNTER — Ambulatory Visit (INDEPENDENT_AMBULATORY_CARE_PROVIDER_SITE_OTHER): Payer: PPO

## 2018-05-27 ENCOUNTER — Ambulatory Visit (INDEPENDENT_AMBULATORY_CARE_PROVIDER_SITE_OTHER): Payer: PPO | Admitting: Family Medicine

## 2018-05-27 ENCOUNTER — Encounter: Payer: Self-pay | Admitting: Family Medicine

## 2018-05-27 VITALS — BP 128/76 | HR 79 | Temp 97.9°F | Wt 165.0 lb

## 2018-05-27 DIAGNOSIS — M545 Low back pain, unspecified: Secondary | ICD-10-CM

## 2018-05-27 DIAGNOSIS — F419 Anxiety disorder, unspecified: Secondary | ICD-10-CM

## 2018-05-27 DIAGNOSIS — F329 Major depressive disorder, single episode, unspecified: Secondary | ICD-10-CM | POA: Diagnosis not present

## 2018-05-27 DIAGNOSIS — R252 Cramp and spasm: Secondary | ICD-10-CM | POA: Diagnosis not present

## 2018-05-27 DIAGNOSIS — G8929 Other chronic pain: Secondary | ICD-10-CM | POA: Diagnosis not present

## 2018-05-27 DIAGNOSIS — M47816 Spondylosis without myelopathy or radiculopathy, lumbar region: Secondary | ICD-10-CM | POA: Diagnosis not present

## 2018-05-27 DIAGNOSIS — F32A Depression, unspecified: Secondary | ICD-10-CM

## 2018-05-27 LAB — BASIC METABOLIC PANEL
BUN: 13 mg/dL (ref 6–23)
CO2: 27 mEq/L (ref 19–32)
Calcium: 9.4 mg/dL (ref 8.4–10.5)
Chloride: 100 mEq/L (ref 96–112)
Creatinine, Ser: 0.88 mg/dL (ref 0.40–1.20)
GFR: 69.58 mL/min (ref >60.00)
Glucose, Bld: 172 mg/dL — ABNORMAL HIGH (ref 70–99)
Potassium: 4.2 mEq/L (ref 3.5–5.1)
Sodium: 136 mEq/L (ref 135–145)

## 2018-05-27 LAB — MAGNESIUM: Magnesium: 2 mg/dL (ref 1.5–2.5)

## 2018-05-27 MED ORDER — ALPRAZOLAM 0.5 MG PO TABS: ORAL_TABLET | ORAL | 1 refills | Status: DC

## 2018-05-27 MED ORDER — TRAMADOL HCL 50 MG PO TABS: 50.0000 mg | ORAL_TABLET | Freq: Four times a day (QID) | ORAL | 1 refills | Status: DC | PRN

## 2018-05-27 NOTE — Assessment & Plan Note (Signed)
Likely related to muscle tightness or possibly from her back.  We will check electrolytes.  She is going to start aquatic therapy soon.  This may be helpful with stretching.

## 2018-05-27 NOTE — Patient Instructions (Signed)
Nice to see you. We will check lab work and an x-ray today and contact you with the results. Please monitor your anxiety and depression and if they worsen please let us know.  We could have you see a psychiatrist if you would like.

## 2018-05-27 NOTE — Assessment & Plan Note (Signed)
Chronic issue.  Somewhat worsened recently.  Will obtain an x-ray today.  She will complete aquatic therapy.  Continue tramadol for pain.  Controlled substance database reviewed.

## 2018-05-27 NOTE — Assessment & Plan Note (Signed)
Continue Xanax.  Refer to therapy.  Controlled substance database reviewed.

## 2018-05-27 NOTE — Progress Notes (Signed)
[name], MD Phone: [phone]  Kristina Mccoy is a 62 y.o. female who presents today for f/u.  CC: leg cramps, chronic back pain, anxiety/depression  Patient notes she is been having leg cramps at night for some time now.  Started after last visit.  Typically occur when she is in bed if she turns over.  Do not occur every night.  Left greater than right.  Feels like a vice-like sensation in her bilateral calves.  Her foot will draw up.  She is drinking lots of water.  Chronic low back pain: This has gotten slightly worse.  It is over her low back.  No radiation, numbness, weakness, incontinence, or saddle anesthesia.  She does take tramadol which does help with her chronic pain.  This does not make her drowsy.  Anxiety/depression: Patient notes some days she feels better than others.  Notes her anxiety gets set off by the littlest of things at times.  She takes Xanax with good benefit.  She has been tried on multiple medications previously though has had side effects with them.  No SI.  Social History   Tobacco Use  Smoking Status Former Smoker  . Packs/day: 1.00  . Years: 30.00  . Pack years: 30.00  . Types: Cigarettes  . Last attempt to quit: 02/28/2013  . Years since quitting: 5.2  Smokeless Tobacco Never Used     ROS see history of present illness  Objective  Physical Exam Vitals:   05/27/18 0954  BP: 128/76  Pulse: 79  Temp: 97.9 F (36.6 C)  SpO2: 98%    BP Readings from Last 3 Encounters:  05/27/18 128/76  04/27/18 128/70  02/02/18 112/60   Wt Readings from Last 3 Encounters:  05/27/18 165 lb (74.8 kg)  04/27/18 164 lb 12.8 oz (74.8 kg)  02/12/18 168 lb (76.2 kg)    Physical Exam  Constitutional: No distress.  Cardiovascular: Normal rate, regular rhythm and normal heart sounds.  Pulmonary/Chest: Effort normal and breath sounds normal.  Musculoskeletal: She exhibits no edema.  No midline spine tenderness, no midline spine step-off, there is  muscular low back tenderness with no overlying skin changes, 5/5 strength bilateral quads, hamstrings, plantar flexion, and dorsiflexion, sensation light touch intact bilateral lower extremities  Neurological: She is alert.  Skin: Skin is warm and dry. She is not diaphoretic.     Assessment/Plan: Please see individual problem list.  Anxiety and depression Continue Xanax.  Refer to therapy.  Controlled substance database reviewed.  Bilateral leg cramps Likely related to muscle tightness or possibly from her back.  We will check electrolytes.  She is going to start aquatic therapy soon.  This may be helpful with stretching.  Chronic bilateral low back pain without sciatica Chronic issue.  Somewhat worsened recently.  Will obtain an x-ray today.  She will complete aquatic therapy.  Continue tramadol for pain.  Controlled substance database reviewed.    Orders Placed This Encounter  Procedures  . DG Lumbar Spine Complete    Standing Status:   Future    Number of Occurrences:   1    Standing Expiration Date:   07/28/2019    Order Specific Question:   Reason for Exam (SYMPTOM  OR DIAGNOSIS REQUIRED)    Answer:   chronic low back pain, worsening recently, no injury    Order Specific Question:   Is patient pregnant?    Answer:   No    Order Specific Question:   Preferred imaging location?  Answer:   [facility] Specific Question:   Radiology Contrast Protocol - do NOT remove file path    Answer:   [email or web address]  . Basic Metabolic Panel (BMET)  . Magnesium  . Ambulatory referral to Psychology    Referral Priority:   Routine    Referral Type:   Psychiatric    Referral Reason:   Specialty Services Required    Requested Specialty:   Psychology    Number of Visits Requested:   1    Meds ordered this encounter  Medications  . ALPRAZolam (XANAX) 0.5 MG tablet    Sig: TAKE 1 TABLET BY MOUTH 4 TIMES A DAY AS NEEDED  FOR ANXIETY    Dispense:  120 tablet    Refill:  1    Not to exceed 4 additional fills before 08/03/2018.  . traMADol (ULTRAM) 50 MG tablet    Sig: Take 1 tablet (50 mg total) by mouth every 6 (six) hours as needed. for pain    Dispense:  120 tablet    Refill:  1    Not to exceed 5 additional fills before 08/03/2018.     [name], MD [place]

## 2018-05-28 ENCOUNTER — Other Ambulatory Visit: Payer: Self-pay | Admitting: Family Medicine

## 2018-05-28 DIAGNOSIS — G8929 Other chronic pain: Secondary | ICD-10-CM

## 2018-05-28 DIAGNOSIS — M545 Low back pain: Principal | ICD-10-CM

## 2018-06-02 ENCOUNTER — Other Ambulatory Visit: Payer: PPO

## 2018-06-02 ENCOUNTER — Ambulatory Visit: Payer: PPO | Admitting: Psychology

## 2018-06-03 DIAGNOSIS — E1165 Type 2 diabetes mellitus with hyperglycemia: Secondary | ICD-10-CM | POA: Diagnosis not present

## 2018-06-03 LAB — HM DIABETES EYE EXAM

## 2018-06-09 ENCOUNTER — Ambulatory Visit: Payer: PPO | Admitting: Psychology

## 2018-06-19 ENCOUNTER — Encounter: Payer: Self-pay | Admitting: Family Medicine

## 2018-06-23 DIAGNOSIS — E1169 Type 2 diabetes mellitus with other specified complication: Secondary | ICD-10-CM | POA: Diagnosis not present

## 2018-06-23 DIAGNOSIS — E1165 Type 2 diabetes mellitus with hyperglycemia: Secondary | ICD-10-CM | POA: Diagnosis not present

## 2018-06-23 DIAGNOSIS — E785 Hyperlipidemia, unspecified: Secondary | ICD-10-CM | POA: Diagnosis not present

## 2018-06-23 DIAGNOSIS — G4733 Obstructive sleep apnea (adult) (pediatric): Secondary | ICD-10-CM | POA: Diagnosis not present

## 2018-06-23 DIAGNOSIS — Z794 Long term (current) use of insulin: Secondary | ICD-10-CM | POA: Diagnosis not present

## 2018-06-25 DIAGNOSIS — Z79810 Long term (current) use of selective estrogen receptor modulators (SERMs): Secondary | ICD-10-CM | POA: Diagnosis not present

## 2018-06-25 DIAGNOSIS — Z5181 Encounter for therapeutic drug level monitoring: Secondary | ICD-10-CM | POA: Diagnosis not present

## 2018-06-25 DIAGNOSIS — R229 Localized swelling, mass and lump, unspecified: Secondary | ICD-10-CM | POA: Diagnosis not present

## 2018-06-25 DIAGNOSIS — Z6830 Body mass index (BMI) 30.0-30.9, adult: Secondary | ICD-10-CM | POA: Diagnosis not present

## 2018-06-25 DIAGNOSIS — Z170 Estrogen receptor positive status [ER+]: Secondary | ICD-10-CM | POA: Diagnosis not present

## 2018-06-25 DIAGNOSIS — C50411 Malignant neoplasm of upper-outer quadrant of right female breast: Secondary | ICD-10-CM | POA: Diagnosis not present

## 2018-06-29 DIAGNOSIS — Z9013 Acquired absence of bilateral breasts and nipples: Secondary | ICD-10-CM | POA: Diagnosis not present

## 2018-06-29 DIAGNOSIS — Z853 Personal history of malignant neoplasm of breast: Secondary | ICD-10-CM | POA: Diagnosis not present

## 2018-06-29 DIAGNOSIS — Z79899 Other long term (current) drug therapy: Secondary | ICD-10-CM | POA: Diagnosis not present

## 2018-06-29 DIAGNOSIS — R222 Localized swelling, mass and lump, trunk: Secondary | ICD-10-CM | POA: Diagnosis not present

## 2018-06-29 DIAGNOSIS — Z87891 Personal history of nicotine dependence: Secondary | ICD-10-CM | POA: Diagnosis not present

## 2018-06-29 DIAGNOSIS — L989 Disorder of the skin and subcutaneous tissue, unspecified: Secondary | ICD-10-CM | POA: Diagnosis not present

## 2018-07-06 DIAGNOSIS — K760 Fatty (change of) liver, not elsewhere classified: Secondary | ICD-10-CM | POA: Diagnosis not present

## 2018-07-06 DIAGNOSIS — R945 Abnormal results of liver function studies: Secondary | ICD-10-CM | POA: Diagnosis not present

## 2018-07-06 DIAGNOSIS — K582 Mixed irritable bowel syndrome: Secondary | ICD-10-CM | POA: Diagnosis not present

## 2018-07-06 DIAGNOSIS — K210 Gastro-esophageal reflux disease with esophagitis: Secondary | ICD-10-CM | POA: Diagnosis not present

## 2018-07-09 DIAGNOSIS — L989 Disorder of the skin and subcutaneous tissue, unspecified: Secondary | ICD-10-CM | POA: Diagnosis not present

## 2018-07-10 ENCOUNTER — Other Ambulatory Visit: Payer: Self-pay | Admitting: Student

## 2018-07-10 DIAGNOSIS — G8929 Other chronic pain: Secondary | ICD-10-CM

## 2018-07-10 DIAGNOSIS — M545 Low back pain, unspecified: Secondary | ICD-10-CM

## 2018-07-10 DIAGNOSIS — M5412 Radiculopathy, cervical region: Secondary | ICD-10-CM | POA: Diagnosis not present

## 2018-07-15 ENCOUNTER — Other Ambulatory Visit: Payer: Self-pay | Admitting: Student

## 2018-07-15 DIAGNOSIS — M5412 Radiculopathy, cervical region: Secondary | ICD-10-CM

## 2018-07-19 ENCOUNTER — Encounter: Payer: Self-pay | Admitting: Family Medicine

## 2018-07-28 NOTE — Telephone Encounter (Signed)
Patient calling to see if the copies of this paperwork is ready for her to pick up? Please advise. Would like a call once this is ready for pick up at the front desk.

## 2018-08-03 ENCOUNTER — Other Ambulatory Visit: Payer: Self-pay | Admitting: Family Medicine

## 2018-08-04 NOTE — Telephone Encounter (Signed)
Controlled substance database reviewed. Sent to pharmacy.   

## 2018-08-04 NOTE — Telephone Encounter (Signed)
Last OV 05/27/2018   Tramadol last refilled 05/27/2018 disp 120 with 1 refill   Xanax last refilled 05/27/2018 disp 120 with 1 refill   Sent to PCP for approval

## 2018-08-16 ENCOUNTER — Other Ambulatory Visit: Payer: Self-pay | Admitting: Family Medicine

## 2018-08-21 DIAGNOSIS — E785 Hyperlipidemia, unspecified: Secondary | ICD-10-CM | POA: Diagnosis not present

## 2018-08-21 DIAGNOSIS — E1165 Type 2 diabetes mellitus with hyperglycemia: Secondary | ICD-10-CM | POA: Diagnosis not present

## 2018-08-21 DIAGNOSIS — E1169 Type 2 diabetes mellitus with other specified complication: Secondary | ICD-10-CM | POA: Diagnosis not present

## 2018-08-21 DIAGNOSIS — Z794 Long term (current) use of insulin: Secondary | ICD-10-CM | POA: Diagnosis not present

## 2018-08-31 ENCOUNTER — Encounter: Payer: Self-pay | Admitting: Family Medicine

## 2018-08-31 ENCOUNTER — Ambulatory Visit (INDEPENDENT_AMBULATORY_CARE_PROVIDER_SITE_OTHER): Payer: PPO | Admitting: Family Medicine

## 2018-08-31 VITALS — BP 118/78 | HR 78 | Temp 97.8°F | Ht 64.0 in | Wt 166.2 lb

## 2018-08-31 DIAGNOSIS — F329 Major depressive disorder, single episode, unspecified: Secondary | ICD-10-CM | POA: Diagnosis not present

## 2018-08-31 DIAGNOSIS — K743 Primary biliary cirrhosis: Secondary | ICD-10-CM | POA: Diagnosis not present

## 2018-08-31 DIAGNOSIS — F32A Depression, unspecified: Secondary | ICD-10-CM

## 2018-08-31 DIAGNOSIS — B349 Viral infection, unspecified: Secondary | ICD-10-CM

## 2018-08-31 DIAGNOSIS — L659 Nonscarring hair loss, unspecified: Secondary | ICD-10-CM

## 2018-08-31 DIAGNOSIS — F419 Anxiety disorder, unspecified: Secondary | ICD-10-CM | POA: Diagnosis not present

## 2018-08-31 DIAGNOSIS — R109 Unspecified abdominal pain: Secondary | ICD-10-CM | POA: Diagnosis not present

## 2018-08-31 LAB — COMPREHENSIVE METABOLIC PANEL
ALT: 44 U/L — ABNORMAL HIGH (ref 0–35)
AST: 30 U/L (ref 0–37)
Albumin: 4.3 g/dL (ref 3.5–5.2)
Alkaline Phosphatase: 120 U/L — ABNORMAL HIGH (ref 39–117)
BUN: 12 mg/dL (ref 6–23)
CO2: 27 mEq/L (ref 19–32)
Calcium: 9.5 mg/dL (ref 8.4–10.5)
Chloride: 101 mEq/L (ref 96–112)
Creatinine, Ser: 0.78 mg/dL (ref 0.40–1.20)
GFR: 79.9 mL/min (ref >60.00)
Glucose, Bld: 125 mg/dL — ABNORMAL HIGH (ref 70–99)
Potassium: 3.9 mEq/L (ref 3.5–5.1)
Sodium: 137 mEq/L (ref 135–145)
Total Bilirubin: 0.3 mg/dL (ref 0.2–1.2)
Total Protein: 8 g/dL (ref 6.0–8.3)

## 2018-08-31 LAB — LIPASE: Lipase: 65 U/L — ABNORMAL HIGH (ref 11.0–59.0)

## 2018-08-31 LAB — CBC WITH DIFFERENTIAL/PLATELET
Basophils Absolute: 0.1 10*3/uL (ref 0.0–0.1)
Basophils Relative: 1 % (ref 0.0–3.0)
Eosinophils Absolute: 0.4 10*3/uL (ref 0.0–0.7)
Eosinophils Relative: 5.7 % — ABNORMAL HIGH (ref 0.0–5.0)
HCT: 43.8 % (ref 36.0–46.0)
Hemoglobin: 14.8 g/dL (ref 12.0–15.0)
Lymphocytes Relative: 29.5 % (ref 12.0–46.0)
Lymphs Abs: 2 10*3/uL (ref 0.7–4.0)
MCHC: 33.8 g/dL (ref 30.0–36.0)
MCV: 91.3 fl (ref 78.0–100.0)
Monocytes Absolute: 0.8 10*3/uL (ref 0.1–1.0)
Monocytes Relative: 12.2 % — ABNORMAL HIGH (ref 3.0–12.0)
Neutro Abs: 3.4 10*3/uL (ref 1.4–7.7)
Neutrophils Relative %: 51.6 % (ref 43.0–77.0)
Platelets: 212 10*3/uL (ref 150.0–400.0)
RBC: 4.8 Mil/uL (ref 3.87–5.11)
RDW: 14.4 % (ref 11.5–15.5)
WBC: 6.6 10*3/uL (ref 4.0–10.5)

## 2018-08-31 LAB — TSH: TSH: 1.32 u[IU]/mL (ref 0.35–4.50)

## 2018-08-31 MED ORDER — ONDANSETRON HCL 4 MG PO TABS: ORAL_TABLET | ORAL | 0 refills | Status: DC

## 2018-08-31 NOTE — Assessment & Plan Note (Signed)
Check TSH 

## 2018-08-31 NOTE — Progress Notes (Signed)
Kristina Rumps, MD Phone: [phone]  Kristina Mccoy is a 62 y.o. female who presents today for follow-up.  CC: URI, anxiety, primary biliary cirrhosis  URI: Patient notes frontal and maxillary sinus pressure and congestion as well as left ear pressure and pain.  This has been going on for about 5 days.  She notes mild cough.  She notes diarrhea starting yesterday with liquid stool.  No BMs today.  Some upper abdominal discomfort with this.  She has that chronically related to her chronic liver issues.  She notes some nausea though no vomiting.  No blood in her stool.  No bowel movement today.  She has had decreased appetite.  No vaginal discharge or dysuria.  She does note a sick contact with upper respiratory symptoms.  She has been trying Flonase.  Anxiety: Notes this has been intense at times.  She does feel sad sometimes though notes no depression.  She continues on Xanax.  She has been seeing a therapist for the last few weeks.  She did try exercising though that was hard on her joints.  No SI.  She has tried antidepressants in the past with no benefit.  She believes she tried Zoloft and Lexapro.  Primary biliary cirrhosis: LFTs were high the last time she was seen.  She notes chronic and intermittent right upper quadrant discomfort.  Her understanding from her last visit with GI was that it was more of an autoimmune issue than fatty liver issue at this point.  She does report her hair seems to be thinning and falling out at times.  Social History   Tobacco Use  Smoking Status Former Smoker  . Packs/day: 1.00  . Years: 30.00  . Pack years: 30.00  . Types: Cigarettes  . Last attempt to quit: 02/28/2013  . Years since quitting: 5.5  Smokeless Tobacco Never Used     ROS see history of present illness  Objective  Physical Exam Vitals:   08/31/18 1059  BP: 118/78  Pulse: 78  Temp: 97.8 F (36.6 C)  SpO2: 97%    BP Readings from Last 3 Encounters:  08/31/18 118/78    05/27/18 128/76  04/27/18 128/70   Wt Readings from Last 3 Encounters:  08/31/18 166 lb 3.2 oz (75.4 kg)  05/27/18 165 lb (74.8 kg)  04/27/18 164 lb 12.8 oz (74.8 kg)    Physical Exam  Constitutional: No distress.  HENT:  Head: Normocephalic and atraumatic.  Mouth/Throat: Oropharynx is clear and moist. No oropharyngeal exudate.  Normal TMs  Eyes: Pupils are equal, round, and reactive to light. Conjunctivae are normal.  Neck: Neck supple.  Cardiovascular: Normal rate, regular rhythm and normal heart sounds.  Pulmonary/Chest: Effort normal and breath sounds normal.  Abdominal: Soft. Bowel sounds are normal. She exhibits no distension. There is tenderness (Slight upper abdominal tenderness). There is no rebound and no guarding.  Musculoskeletal: She exhibits no edema.  Lymphadenopathy:    She has no cervical adenopathy.  Neurological: She is alert.  Skin: Skin is warm and dry. She is not diaphoretic.     Assessment/Plan: Please see individual problem list.  Primary biliary cirrhosis ([place]) We are rechecking liver function testing and we will check a lipase given recent abdominal discomfort though I suspect that is more related to her viral illness with diarrhea.  She will continue to see GI.  Viral illness I suspect her respiratory symptoms and diarrhea are related to a viral illness.  She will continue Flonase.  Discussed supportive  care.  We will check lab work.  Anxiety and depression Chronic issue.  Discussed antidepressants with her though she opted to continue with Xanax and her therapist.  Hair loss Check TSH.    Orders Placed This Encounter  Procedures  . TSH  . Comp Met (CMET)  . Lipase  . CBC w/Diff    Meds ordered this encounter  Medications  . ondansetron (ZOFRAN) 4 MG tablet    Sig: TAKE 1 TABLET BY MOUTH EVERY 8 HOURS AS NEEDED FOR NAUSEA AND VOMITING    Dispense:  20 tablet    Refill:  0     Kristina Rumps, MD [place]

## 2018-08-31 NOTE — Assessment & Plan Note (Signed)
I suspect her respiratory symptoms and diarrhea are related to a viral illness.  She will continue Flonase.  Discussed supportive care.  We will check lab work.

## 2018-08-31 NOTE — Assessment & Plan Note (Signed)
We are rechecking liver function testing and we will check a lipase given recent abdominal discomfort though I suspect that is more related to her viral illness with diarrhea.  She will continue to see GI.

## 2018-08-31 NOTE — Assessment & Plan Note (Signed)
Chronic issue.  Discussed antidepressants with her though she opted to continue with Xanax and her therapist.

## 2018-08-31 NOTE — Patient Instructions (Signed)
Nice to see you. Please use Flonase for your sinuses.  If you are not improving by Thursday please call us to follow-up. We will get lab work today and contact you with the results. Please continue to see the therapist.

## 2018-09-01 ENCOUNTER — Other Ambulatory Visit: Payer: Self-pay | Admitting: Family Medicine

## 2018-09-01 DIAGNOSIS — R748 Abnormal levels of other serum enzymes: Secondary | ICD-10-CM

## 2018-09-04 ENCOUNTER — Other Ambulatory Visit (INDEPENDENT_AMBULATORY_CARE_PROVIDER_SITE_OTHER): Payer: PPO

## 2018-09-04 DIAGNOSIS — R748 Abnormal levels of other serum enzymes: Secondary | ICD-10-CM

## 2018-09-04 LAB — LIPASE: Lipase: 52 U/L (ref 11.0–59.0)

## 2018-09-17 ENCOUNTER — Encounter: Payer: Self-pay | Admitting: Family Medicine

## 2018-09-17 ENCOUNTER — Ambulatory Visit (INDEPENDENT_AMBULATORY_CARE_PROVIDER_SITE_OTHER): Payer: PPO | Admitting: Family Medicine

## 2018-09-17 VITALS — BP 118/70 | HR 73 | Temp 97.5°F | Ht 63.0 in | Wt 167.0 lb

## 2018-09-17 DIAGNOSIS — J019 Acute sinusitis, unspecified: Secondary | ICD-10-CM

## 2018-09-17 DIAGNOSIS — N904 Leukoplakia of vulva: Secondary | ICD-10-CM | POA: Diagnosis not present

## 2018-09-17 DIAGNOSIS — R0982 Postnasal drip: Secondary | ICD-10-CM

## 2018-09-17 DIAGNOSIS — R509 Fever, unspecified: Secondary | ICD-10-CM | POA: Diagnosis not present

## 2018-09-17 MED ORDER — AMOXICILLIN-POT CLAVULANATE 875-125 MG PO TABS: 1.0000 | ORAL_TABLET | Freq: Two times a day (BID) | ORAL | 0 refills | Status: DC

## 2018-09-17 NOTE — Progress Notes (Signed)
Subjective:    Patient ID: Kristina Mccoy, female    DOB: [birth year], 62 y.o.   MRN: [id number]  HPI  Presents to clinic c/o sinus pain, congestion, pressure above eyes & teeth, heaviness in head for almost 3 weeks.  She is taking her Flonase every day, but congestion is not improving. States she has also had some yellow nasal drainage.   Does report feeling feverish at times & is constantly clearing throat due to drainage.    Patient Active Problem List   Diagnosis Date Noted  . Viral illness 08/31/2018  . Hair loss 08/31/2018  . Bilateral leg cramps 05/27/2018  . Memory difficulty 04/27/2018  . Iliotibial band syndrome 04/27/2018  . Respiratory illness 04/27/2018  . Allergic rhinitis 01/26/2018  . RUQ pain 10/28/2017  . Obesity (BMI 30.0-34.9) 10/28/2017  . Pain in limb 06/03/2017  . Chest pain 01/17/2017  . Throat fullness 10/15/2016  . 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 Rash 08/08/2016  . Lower abdominal pain 08/08/2016  . Cephalalgia 01/26/2016  . Dysuria 12/20/2015  . Vitamin D deficiency 12/20/2015  . Sjogren's syndrome ([place]) 12/20/2015  . Chronic fatigue 12/04/2015  . Insomnia 10/19/2015  . Chronic bilateral low back pain without sciatica 11/07/2014  . OSA on CPAP 10/19/2014  . Anxiety and depression 08/16/2014  . Diabetes type 2, uncontrolled ([place]) 07/06/2013  . Malignant neoplasm of breast (female) ([place]) 02/28/2013  . Blood clotting disorder ([place]) 12/01/2012  . Hyperlipidemia 12/01/2012  . Chronic pain disorder 06/11/2012  . Hearing loss 09/16/2011   Social History   Tobacco Use  . Smoking status: Former Smoker    Packs/day: 1.00    Years: 30.00    Pack years: 30.00    Types: Cigarettes    Last attempt to quit: 02/28/2013    Years since quitting: 5.5  . Smokeless tobacco: Never Used  Substance Use Topics  . Alcohol use: No    Alcohol/week: 0.0 standard drinks   Review of Systems  Constitutional: Negative for chills, fatigue and  fever.  HENT: +congestion, ear pain, sinus pain and sore throat.   Eyes: Negative.   Respiratory: Negative for cough, shortness of breath and wheezing.   Cardiovascular: Negative for chest pain, palpitations and leg swelling.  Gastrointestinal: Negative for abdominal pain, diarrhea, nausea and vomiting.  Genitourinary: Negative for dysuria, frequency and urgency.  Musculoskeletal: Negative for arthralgias and myalgias.  Skin: Negative for color change, pallor and rash.  Neurological: Negative for syncope, light-headedness and headaches.  Psychiatric/Behavioral: The patient is not nervous/anxious.       Objective:   Physical Exam  Constitutional: She appears well-developed and well-nourished. No distress.  HENT:  Head: Normocephalic and atraumatic.  Nose/throat: Positive nasal tenderness, positive facial maxillary sinus tenderness.  Positive thick yellow nasal discharge.  Positive postnasal drip.  Positive redness in back of throat, but no exudates seen. Eyes: EOM are normal. No scleral icterus.  Neck: Normal range of motion. Neck supple. No tracheal deviation present.  Cardiovascular: Normal rate, regular rhythm and normal heart sounds.  Pulmonary/Chest: Effort normal and breath sounds normal. No respiratory distress. She has no wheezes. She has no rales.  Neurological: She is alert and oriented to person, place, and time.  Gait normal  Skin: Skin is warm and dry. No pallor.  Psychiatric: She has a normal mood and affect. Her behavior is normal. Thought content normal.   Nursing note and vitals reviewed.   Vitals:   09/17/18 1425  BP: 118/70  Pulse: 73  Temp: (!) 97.5 F (36.4 C)  SpO2: 98%      Assessment & Plan:   Acute sinusitis, postnasal drip, fever- patient has been diligent with her Flonase, but sinus symptoms continue to worsen.  She will take Augmentin twice daily for 10 days to treat sinus infection.  Advised to continue her Flonase and also try a saline nasal  wash with a Nettie pot to help rinse out congestion.  Patient also advised to use Tylenol as needed to help improve fever and pain.  Also suggested that patient can put a warm compress over her face to help improve sinus headache and pressure pain  Patient will keep regularly scheduled follow-up with PCP as planned advised to return to clinic sooner if any issues arise.

## 2018-10-01 ENCOUNTER — Ambulatory Visit: Payer: Self-pay | Admitting: *Deleted

## 2018-10-01 ENCOUNTER — Other Ambulatory Visit: Payer: Self-pay | Admitting: Family Medicine

## 2018-10-01 NOTE — Telephone Encounter (Signed)
FYI

## 2018-10-01 NOTE — Telephone Encounter (Signed)
Controlled substance database reviewed. Sent to pharmacy.   

## 2018-10-01 NOTE — Telephone Encounter (Signed)
Summary: nausea, diarrhea, headache, stuffy nose, chills x5 days   Patient calling and states that she is having nausea, diarrhea, headache, stuffy nose, chills x5 days. Would like to know if this is symptoms of the flu or anything else that is going around? Please advise.      Patient is calling to report she is having multiple symptoms- she has had diarrhea and nausea since Sunday. Patient states she feels everything she takes in is processing through very quickly.  Patient has just been treated for sinusitis and feels that is not over. She also states she is having chills. Reason for Disposition . [1] Recent antibiotic therapy (i.e., within last 2 months) AND [2] diarrhea present > 3 days since antibiotic was stopped  Answer Assessment - Initial Assessment Questions 1. DIARRHEA SEVERITY: "How bad is the diarrhea?" "How many extra stools have you had in the past 24 hours than normal?"    - NO DIARRHEA (SCALE 0)   - MILD (SCALE 1-3): Few loose or mushy BMs; increase of 1-3 stools over normal daily number of stools; mild increase in ostomy output.   -  MODERATE (SCALE 4-7): Increase of 4-6 stools daily over normal; moderate increase in ostomy output. * SEVERE (SCALE 8-10; OR 'WORST POSSIBLE'): Increase of 7 or more stools daily over normal; moderate increase in ostomy output; incontinence.     2-3 times/day 2. ONSET: "When did the diarrhea begin?"      Since Sunday 3. BM CONSISTENCY: "How loose or watery is the diarrhea?"      Sometimes watery- sometimes loose 4. VOMITING: "Are you also vomiting?" If so, ask: "How many times in the past 24 hours?"      No vomiting- just nausea- takes medication 5. ABDOMINAL PAIN: "Are you having any abdominal pain?" If yes: "What does it feel like?" (e.g., crampy, dull, intermittent, constant)      Tender- but no pain 6. ABDOMINAL PAIN SEVERITY: If present, ask: "How bad is the pain?"  (e.g., Scale 1-10; mild, moderate, or severe)   - MILD (1-3): doesn't  interfere with normal activities, abdomen soft and not tender to touch    - MODERATE (4-7): interferes with normal activities or awakens from sleep, tender to touch    - SEVERE (8-10): excruciating pain, doubled over, unable to do any normal activities       mild 7. ORAL INTAKE: If vomiting, "Have you been able to drink liquids?" "How much fluids have you had in the past 24 hours?"     keeping water, sugar free Gatorade, tes 8. HYDRATION: "Any signs of dehydration?" (e.g., dry mouth [not just dry lips], too weak to stand, dizziness, new weight loss) "When did you last urinate?"     Weakness and a little dizzy, metallic taste in mouth 9. EXPOSURE: "Have you traveled to a foreign country recently?" "Have you been exposed to anyone with diarrhea?" "Could you have eaten any food that was spoiled?"     A friend has been ill - vomiting and nausea 10. ANTIBIOTIC USE: "Are you taking antibiotics now or have you taken antibiotics in the past 2 months?"       Patient ended 10 day course 12/29 11. OTHER SYMPTOMS: "Do you have any other symptoms?" (e.g., fever, blood in stool)       Chills- couldn't get warm 12. PREGNANCY: "Is there any chance you are pregnant?" "When was your last menstrual period?"       n/a  Protocols used: DIARRHEA-A-AH

## 2018-10-02 ENCOUNTER — Encounter: Payer: Self-pay | Admitting: Family Medicine

## 2018-10-02 ENCOUNTER — Ambulatory Visit (INDEPENDENT_AMBULATORY_CARE_PROVIDER_SITE_OTHER): Payer: PPO | Admitting: Family Medicine

## 2018-10-02 VITALS — BP 110/62 | HR 73 | Temp 98.0°F | Ht 63.0 in | Wt 165.4 lb

## 2018-10-02 DIAGNOSIS — R197 Diarrhea, unspecified: Secondary | ICD-10-CM | POA: Diagnosis not present

## 2018-10-02 DIAGNOSIS — R748 Abnormal levels of other serum enzymes: Secondary | ICD-10-CM | POA: Diagnosis not present

## 2018-10-02 DIAGNOSIS — R1110 Vomiting, unspecified: Secondary | ICD-10-CM | POA: Diagnosis not present

## 2018-10-02 DIAGNOSIS — A084 Viral intestinal infection, unspecified: Secondary | ICD-10-CM

## 2018-10-02 DIAGNOSIS — R110 Nausea: Secondary | ICD-10-CM

## 2018-10-02 MED ORDER — ONDANSETRON HCL 4 MG PO TABS: ORAL_TABLET | ORAL | 1 refills | Status: DC

## 2018-10-02 NOTE — Patient Instructions (Signed)
Bland Diet  A bland diet consists of foods that are often soft and do not have a lot of fat, fiber, or extra seasonings. Foods without fat, fiber, or seasoning are easier for the body to digest. They are also less likely to irritate your mouth, throat, stomach, and other parts of your digestive system. A bland diet is sometimes called a BRAT diet.  What is my plan?  Your health care provider or food and nutrition specialist (dietitian) may recommend specific changes to your diet to prevent symptoms or to treat your symptoms. These changes may include:   Eating small meals often.   Cooking food until it is soft enough to chew easily.   Chewing your food well.   Drinking fluids slowly.   Not eating foods that are very spicy, sour, or fatty.   Not eating citrus fruits, such as oranges and grapefruit.  What do I need to know about this diet?   Eat a variety of foods from the bland diet food list.   Do not follow a bland diet longer than needed.   Ask your health care provider whether you should take vitamins or supplements.  What foods can I eat?  Grains    Hot cereals, such as cream of wheat. Rice. Bread, crackers, or tortillas made from refined white flour.  Vegetables  Canned or cooked vegetables. Mashed or boiled potatoes.  Fruits    Bananas. Applesauce. Other types of cooked or canned fruit with the skin and seeds removed, such as canned peaches or pears.  Meats and other proteins    Scrambled eggs. Creamy peanut butter or other nut butters. Lean, well-cooked meats, such as chicken or fish. Tofu. Soups or broths.  Dairy  Low-fat dairy products, such as milk, cottage cheese, or yogurt.  Beverages    Water. Herbal tea. Apple juice.  Fats and oils  Mild salad dressings. Canola or olive oil.  Sweets and desserts  Pudding. Custard. Fruit gelatin. Ice cream.  The items listed above may not be a complete list of recommended foods and beverages. Contact a dietitian for more options.  What foods are not  recommended?  Grains  Whole grain breads and cereals.  Vegetables  Raw vegetables.  Fruits  Raw fruits, especially citrus, berries, or dried fruits.  Dairy  Whole fat dairy foods.  Beverages  Caffeinated drinks. Alcohol.  Seasonings and condiments  Strongly flavored seasonings or condiments. Hot sauce. Salsa.  Other foods  Spicy foods. Fried foods. Sour foods, such as pickled or fermented foods. Foods with high sugar content. Foods high in fiber.  The items listed above may not be a complete list of foods and beverages to avoid. Contact a dietitian for more information.  Summary   A bland diet consists of foods that are often soft and do not have a lot of fat, fiber, or extra seasonings.   Foods without fat, fiber, or seasoning are easier for the body to digest.   Check with your health care provider to see how long you should follow this diet plan. It is not meant to be followed for long periods.  This information is not intended to replace advice given to you by your health care provider. Make sure you discuss any questions you have with your health care provider.  Document Released: 01/08/2016 Document Revised: 10/15/2017 Document Reviewed: 10/15/2017  Elsevier Interactive Patient Education  2019 [employer]

## 2018-10-02 NOTE — Telephone Encounter (Signed)
Sent to PCP ?

## 2018-10-02 NOTE — Progress Notes (Signed)
Subjective:    Patient ID: Kristina Mccoy, female    DOB: 22-May-1956, 63 y.o.   MRN: [id number]  HPI   Patient presents to clinic complaining of fever/chills, nausea, vomiting and diarrhea for about 1 week.  Patient states that she recently finished antibiotics for sinus infection, and the symptoms from her stomach began the next day.  Patient states the vomiting stopped 2 days ago, still has nausea.  Patient also states she has not had a fever in the past 2 days.  Patient did have diarrhea yesterday, but has not had any diarrhea today.  Patient does not have much of an appetite, but she has been eating saltines, chicken noodle soup and drinking Gatorade and water.  Patient has no known sick contacts.  Patient Active Problem List   Diagnosis Date Noted  . Viral illness 08/31/2018  . Hair loss 08/31/2018  . Bilateral leg cramps 05/27/2018  . Memory difficulty 04/27/2018  . Iliotibial band syndrome 04/27/2018  . Respiratory illness 04/27/2018  . Allergic rhinitis 01/26/2018  . RUQ pain 10/28/2017  . Obesity (BMI 30.0-34.9) 10/28/2017  . Pain in limb 06/03/2017  . Chest pain 01/17/2017  . Throat fullness 10/15/2016  . 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 Rash 08/08/2016  . Lower abdominal pain 08/08/2016  . Cephalalgia 01/26/2016  . Dysuria 12/20/2015  . Vitamin D deficiency 12/20/2015  . Sjogren's syndrome ([place]) 12/20/2015  . Chronic fatigue 12/04/2015  . Insomnia 10/19/2015  . Chronic bilateral low back pain without sciatica 11/07/2014  . OSA on CPAP 10/19/2014  . Anxiety and depression 08/16/2014  . Diabetes type 2, uncontrolled ([place]) 07/06/2013  . Malignant neoplasm of breast (female) ([place]) 02/28/2013  . Blood clotting disorder ([place]) 12/01/2012  . Hyperlipidemia 12/01/2012  . Chronic pain disorder 06/11/2012  . Hearing loss 09/16/2011   Social History   Tobacco Use  . Smoking status: Former Smoker    Packs/day: 1.00    Years: 30.00    Pack years: 30.00    Types: Cigarettes    Last attempt to quit: 02/28/2013    Years since quitting: 5.6  . Smokeless tobacco: Never Used  Substance Use Topics  . Alcohol use: No    Alcohol/week: 0.0 standard drinks   Review of Systems   Constitutional: +chills, fatigue and fever.  HENT: Negative for congestion, ear pain, sinus pain and sore throat.   Eyes: Negative.   Respiratory: Negative for cough, shortness of breath and wheezing.   Cardiovascular: Negative for chest pain, palpitations and leg swelling.  Gastrointestinal:+abdominal pain, diarrhea, nausea and vomiting.  Genitourinary: Negative for dysuria, frequency and urgency.  Musculoskeletal: Negative for arthralgias and myalgias.  Skin: Negative for color change, pallor and rash.  Neurological: Negative for syncope, light-headedness and headaches.  Psychiatric/Behavioral: The patient is not nervous/anxious.       Objective:   Physical Exam  Constitutional: She appears well-developed and well-nourished. No distress.  HENT:  Head: Normocephalic and atraumatic.  Eyes: Pupils are equal, round, and reactive to light. EOM are normal. No scleral icterus.  Neck: Normal range of motion. Neck supple. No tracheal deviation present.  Cardiovascular: Normal rate, regular rhythm and normal heart sounds.  Pulmonary/Chest: Effort normal and breath sounds normal. No respiratory distress. She has no wheezes. She has no rales.  Abdominal: Soft. Bowel sounds are normal.  Generalized abdominal tenderness, no specific region of abdomen is more tender than the other.  No guarding or rebound.  No CVA tenderness. Neurological: She is alert  and oriented to person, place, and time.  Gait normal  Skin: Skin is warm and dry. No pallor.  Psychiatric: She has a normal mood and affect. Her behavior is normal. Thought content normal.   Nursing note and vitals reviewed.  Vitals:   10/02/18 1551  BP: 110/62  Pulse: 73  Temp: 98 F (36.7 C)  SpO2: 98%      Assessment  & Plan:   Nausea, vomiting, diarrhea, viral gastroenteritis - due to symptoms slowly resolving on their own and I do suspect patient symptoms are all related to a viral gastroenteritis.  Patient would like lab work today, we will do CBC, CMP and also check amylase and lipase.  Patient advised to follow a bland diet over the next few days to slowly ease stomach back into digesting foods.  Also advised to be sure to keep self well-hydrated with water, Gatorade, Pedialyte.  Patient will use Zofran as needed for any nausea symptoms.  Patient aware she will be contacted in regards to lab results.  Also advised that if her symptoms worsen rather than continue to improve, she should call office right away and/or go to emergency room for evaluation.

## 2018-10-03 LAB — CBC
HCT: 41.9 % (ref 35.0–45.0)
Hemoglobin: 14.4 g/dL (ref 11.7–15.5)
MCH: 31.2 pg (ref 27.0–33.0)
MCHC: 34.4 g/dL (ref 32.0–36.0)
MCV: 90.9 fL (ref 80.0–100.0)
MPV: 11.2 fL (ref 7.5–12.5)
Platelets: 219 10*3/uL (ref 140–400)
RBC: 4.61 10*6/uL (ref 3.80–5.10)
RDW: 13.5 % (ref 11.0–15.0)
WBC: 6.8 10*3/uL (ref 3.8–10.8)

## 2018-10-03 LAB — COMPREHENSIVE METABOLIC PANEL
AG Ratio: 1.4 (calc) (ref 1.0–2.5)
ALT: 38 U/L — ABNORMAL HIGH (ref 6–29)
AST: 31 U/L (ref 10–35)
Albumin: 4.1 g/dL (ref 3.6–5.1)
Alkaline phosphatase (APISO): 139 U/L — ABNORMAL HIGH (ref 33–130)
BUN: 7 mg/dL (ref 7–25)
CO2: 29 mmol/L (ref 20–32)
Calcium: 9.3 mg/dL (ref 8.6–10.4)
Chloride: 100 mmol/L (ref 98–110)
Creat: 0.72 mg/dL (ref 0.50–0.99)
Globulin: 3 g/dL (calc) (ref 1.9–3.7)
Glucose, Bld: 243 mg/dL — ABNORMAL HIGH (ref 65–99)
Potassium: 4.4 mmol/L (ref 3.5–5.3)
Sodium: 137 mmol/L (ref 135–146)
Total Bilirubin: 0.4 mg/dL (ref 0.2–1.2)
Total Protein: 7.1 g/dL (ref 6.1–8.1)

## 2018-10-03 LAB — AMYLASE: Amylase: 53 U/L (ref 21–101)

## 2018-10-03 LAB — LIPASE: Lipase: 64 U/L — ABNORMAL HIGH (ref 7–60)

## 2018-10-05 NOTE — Addendum Note (Signed)
Addended by: Philis Nettle on: 10/05/2018 12:26 PM   Modules accepted: Orders

## 2018-10-07 ENCOUNTER — Other Ambulatory Visit: Payer: Self-pay | Admitting: Gastroenterology

## 2018-10-07 DIAGNOSIS — R748 Abnormal levels of other serum enzymes: Secondary | ICD-10-CM

## 2018-10-12 ENCOUNTER — Encounter: Payer: Self-pay | Admitting: Family Medicine

## 2018-10-14 ENCOUNTER — Ambulatory Visit (INDEPENDENT_AMBULATORY_CARE_PROVIDER_SITE_OTHER): Payer: PPO | Admitting: Family Medicine

## 2018-10-14 ENCOUNTER — Other Ambulatory Visit: Payer: Self-pay | Admitting: Family Medicine

## 2018-10-14 ENCOUNTER — Encounter: Payer: Self-pay | Admitting: Family Medicine

## 2018-10-14 VITALS — BP 110/60 | HR 88 | Temp 97.5°F | Resp 16 | Ht 63.0 in | Wt 167.4 lb

## 2018-10-14 DIAGNOSIS — J0140 Acute pansinusitis, unspecified: Secondary | ICD-10-CM

## 2018-10-14 DIAGNOSIS — R748 Abnormal levels of other serum enzymes: Secondary | ICD-10-CM | POA: Diagnosis not present

## 2018-10-14 LAB — HEPATIC FUNCTION PANEL
ALT: 46 U/L — ABNORMAL HIGH (ref 0–35)
AST: 28 U/L (ref 0–37)
Albumin: 3.9 g/dL (ref 3.5–5.2)
Alkaline Phosphatase: 132 U/L — ABNORMAL HIGH (ref 39–117)
Bilirubin, Direct: 0.1 mg/dL (ref 0.0–0.3)
Total Bilirubin: 0.4 mg/dL (ref 0.2–1.2)
Total Protein: 7.4 g/dL (ref 6.0–8.3)

## 2018-10-14 LAB — LIPASE: Lipase: 61 U/L — ABNORMAL HIGH (ref 11.0–59.0)

## 2018-10-14 MED ORDER — AMOXICILLIN-POT CLAVULANATE 875-125 MG PO TABS: 1.0000 | ORAL_TABLET | Freq: Two times a day (BID) | ORAL | 0 refills | Status: DC

## 2018-10-14 NOTE — Progress Notes (Signed)
Subjective:    Patient ID: Kristina Mccoy, female    DOB: [birth year], 63 y.o.   MRN: [id number]  HPI   Patient presents to clinic complaining of continued sinus pressure, sinus headache, thick nasal drainage, cough and low energy.  Patient was treated for sinus infection in the mid December 2019, states she felt better for a period of time, but then sinus symptoms slowly began to return.  In between the sinus infection she ended up having a GI virus that caused her to have nausea and vomiting.  Currently she denies any fever or chills.  Denies any shortness of breath or wheezing.  Denies chest pain.  Denies nausea/vomiting or diarrhea.  Main complaint is sinus headache and severe sinus congestion.  Patient Active Problem List   Diagnosis Date Noted  . Viral illness 08/31/2018  . Hair loss 08/31/2018  . Bilateral leg cramps 05/27/2018  . Memory difficulty 04/27/2018  . Iliotibial band syndrome 04/27/2018  . Respiratory illness 04/27/2018  . Allergic rhinitis 01/26/2018  . RUQ pain 10/28/2017  . Obesity (BMI 30.0-34.9) 10/28/2017  . Pain in limb 06/03/2017  . Chest pain 01/17/2017  . Throat fullness 10/15/2016  . 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 Rash 08/08/2016  . Lower abdominal pain 08/08/2016  . Cephalalgia 01/26/2016  . Dysuria 12/20/2015  . Vitamin D deficiency 12/20/2015  . Sjogren's syndrome ([place]) 12/20/2015  . Chronic fatigue 12/04/2015  . Insomnia 10/19/2015  . Chronic bilateral low back pain without sciatica 11/07/2014  . OSA on CPAP 10/19/2014  . Anxiety and depression 08/16/2014  . Diabetes type 2, uncontrolled ([place]) 07/06/2013  . Malignant neoplasm of breast (female) ([place]) 02/28/2013  . Blood clotting disorder ([place]) 12/01/2012  . Hyperlipidemia 12/01/2012  . Chronic pain disorder 06/11/2012  . Hearing loss 09/16/2011   Social History   Tobacco Use  . Smoking status: Former Smoker    Packs/day: 1.00    Years: 30.00    Pack years: 30.00   Types: Cigarettes    Last attempt to quit: 02/28/2013    Years since quitting: 5.6  . Smokeless tobacco: Never Used  Substance Use Topics  . Alcohol use: No    Alcohol/week: 0.0 standard drinks   Review of Systems  Constitutional: Negative for chills, fatigue and fever.  HENT: +congestion, ear pain, sinus pain (forehead, cheeks, above teeth)  Eyes: Negative.   Respiratory: Negative for cough, shortness of breath and wheezing.   Cardiovascular: Negative for chest pain, palpitations and leg swelling.  Gastrointestinal: Negative for abdominal pain, diarrhea, nausea and vomiting.  Genitourinary: Negative for dysuria, frequency and urgency.  Musculoskeletal: Negative for arthralgias and myalgias.  Skin: Negative for color change, pallor and rash.  Neurological: Negative for syncope, light-headedness. +sinus headache Psychiatric/Behavioral: The patient is not nervous/anxious.       Objective:   Physical Exam Vitals signs and nursing note reviewed.  Constitutional:      General: She is not in acute distress.    Appearance: Normal appearance. She is not toxic-appearing.  HENT:     Head: Normocephalic and atraumatic.     Ears:     Comments: Fullness bilateral TMs    Nose: Congestion and rhinorrhea present.     Right Sinus: Maxillary sinus tenderness and frontal sinus tenderness present.     Left Sinus: Maxillary sinus tenderness and frontal sinus tenderness present.     Mouth/Throat:     Mouth: Mucous membranes are moist.  Eyes:     General: No  scleral icterus.    Extraocular Movements: Extraocular movements intact.     Conjunctiva/sclera: Conjunctivae normal.  Neck:     Musculoskeletal: Neck supple. No neck rigidity.  Cardiovascular:     Rate and Rhythm: Normal rate and regular rhythm.  Pulmonary:     Effort: Pulmonary effort is normal. No respiratory distress.     Breath sounds: Normal breath sounds. No wheezing, rhonchi or rales.  Skin:    General: Skin is warm and dry.      Coloration: Skin is not pale.  Neurological:     Mental Status: She is alert and oriented to person, place, and time.  Psychiatric:        Mood and Affect: Mood normal.        Behavior: Behavior normal.    Vitals:   10/14/18 0823  BP: 110/60  Pulse: 88  Resp: 16  Temp: (!) 97.5 F (36.4 C)  SpO2: 99%      Assessment & Plan:    Acute sinusitis - patient will take Augmentin twice daily.  Patient has tolerated this medication without issue in the past.  Advised to take probiotic with this medicine to avoid stomach upset.  Patient encouraged to continue using saline nasal spray to help reduce congestion as well as getting plenty of rest and drinking lots of fluids.  Elevated lipase/elevated liver enzymes - patient did have these 2 elevations in blood work at last visit, we will recheck labs today.  Patient will keep regularly scheduled follow-up with PCP as planned.  She will return to clinic sooner if any issues arise.

## 2018-10-15 NOTE — Telephone Encounter (Signed)
Last OV 10/14/2018  Last refilled 08/04/2018 disp 120 with 1 refill   Sent to PCP for approval

## 2018-10-16 ENCOUNTER — Encounter: Payer: Self-pay | Admitting: Family Medicine

## 2018-10-16 NOTE — Telephone Encounter (Signed)
Controlled substance database reviewed. Sent to pharmacy.   

## 2018-10-19 ENCOUNTER — Other Ambulatory Visit: Payer: Self-pay

## 2018-10-19 ENCOUNTER — Encounter: Payer: Self-pay | Admitting: Family Medicine

## 2018-10-19 MED ORDER — FLUCONAZOLE 150 MG PO TABS: 150.0000 mg | ORAL_TABLET | ORAL | 0 refills | Status: DC

## 2018-10-20 ENCOUNTER — Other Ambulatory Visit: Payer: Self-pay | Admitting: Gastroenterology

## 2018-10-20 DIAGNOSIS — R748 Abnormal levels of other serum enzymes: Secondary | ICD-10-CM

## 2018-10-21 ENCOUNTER — Ambulatory Visit
Admission: RE | Admit: 2018-10-21 | Discharge: 2018-10-21 | Disposition: A | Discharge status: Home / Self Care | Payer: PPO | Source: Ambulatory Visit | Attending: Gastroenterology | Admitting: Gastroenterology

## 2018-10-21 DIAGNOSIS — R7989 Other specified abnormal findings of blood chemistry: Secondary | ICD-10-CM | POA: Diagnosis not present

## 2018-10-21 DIAGNOSIS — R932 Abnormal findings on diagnostic imaging of liver and biliary tract: Secondary | ICD-10-CM | POA: Diagnosis not present

## 2018-10-21 DIAGNOSIS — R748 Abnormal levels of other serum enzymes: Secondary | ICD-10-CM | POA: Insufficient documentation

## 2018-10-21 MED ORDER — GADOBUTROL 1 MMOL/ML IV SOLN
7.0000 mL | Freq: Once | INTRAVENOUS | Status: AC | PRN
  Administered 2018-10-21: 7 mL via INTRAVENOUS

## 2018-11-03 ENCOUNTER — Ambulatory Visit: Payer: Self-pay

## 2018-11-03 ENCOUNTER — Encounter: Payer: Self-pay | Admitting: Family Medicine

## 2018-11-03 ENCOUNTER — Ambulatory Visit (INDEPENDENT_AMBULATORY_CARE_PROVIDER_SITE_OTHER): Payer: PPO | Admitting: Family Medicine

## 2018-11-03 VITALS — BP 132/70 | HR 83 | Temp 98.8°F | Resp 18 | Ht 63.5 in | Wt 167.2 lb

## 2018-11-03 DIAGNOSIS — R51 Headache: Secondary | ICD-10-CM

## 2018-11-03 DIAGNOSIS — J329 Chronic sinusitis, unspecified: Secondary | ICD-10-CM

## 2018-11-03 LAB — POC INFLUENZA A&B (BINAX/QUICKVUE)
Influenza A, POC: NEGATIVE
Influenza B, POC: NEGATIVE

## 2018-11-03 MED ORDER — CEFTRIAXONE SODIUM 500 MG IJ SOLR
500.0000 mg | Freq: Once | INTRAMUSCULAR | Status: AC
  Administered 2018-11-03: 500 mg via INTRAMUSCULAR

## 2018-11-03 MED ORDER — KETOROLAC TROMETHAMINE 30 MG/ML IJ SOLN
30.0000 mg | Freq: Once | INTRAMUSCULAR | Status: AC
  Administered 2018-11-03: 30 mg via INTRAMUSCULAR

## 2018-11-03 NOTE — Telephone Encounter (Signed)
Patient called in with c/o "headache." She says "on Sunday the headache started and last night it was so bad, I couldn\'t get my head up off the pillow, my neck was stiff and my back hurt. The pain last night was a 10. Today it is a 7-8, I\'ve taken Tylenol. I was also having chills last night and couldn\'t get warm, then woke up in a sweat, body aches, eye pain, sinus congestion." I asked about other symptoms, head injury, she denies. According to protocol, see PCP within 24 hours, no availability with PCP, appointment scheduled for today, 11/03/18 at 1520 with Lauren Guse, FNP, care advice given, patient verbalized understanding.  Reason for Disposition . [1] MODERATE headache (e.g., interferes with normal activities) AND [2] present > 24 hours AND [3] unexplained  (Exceptions: analgesics not tried, typical migraine, or headache part of viral illness)  Answer Assessment - Initial Assessment Questions 1. LOCATION: "Where does it hurt?"      Mostly in the front, top, behind eyes 2. ONSET: "When did the headache start?" (Minutes, hours or days)     Sunday 3. PATTERN: "Does the pain come and go, or has it been constant since it started?"     Headache constant 4. SEVERITY: "How bad is the pain?" and "What does it keep you from doing?"  (e.g., Scale 1-10; mild, moderate, or severe)   - MILD (1-3): doesn\'t interfere with normal activities    - MODERATE (4-7): interferes with normal activities or awakens from sleep    - SEVERE (8-10): excruciating pain, unable to do any normal activities        10  last night; today the pain is a 7-8 5. RECURRENT SYMPTOM: "Have you ever had headaches before?" If so, ask: "When was the last time?" and "What happened that time?"      Yes, but nothing like this 6. CAUSE: "What do you think is causing the headache?"     I don't know 7. MIGRAINE: "Have you been diagnosed with migraine headaches?" If so, ask: "Is this headache similar?"      No 8. HEAD INJURY: "Has there been  any recent injury to the head?"      No 9. OTHER SYMPTOMS: "Do you have any other symptoms?" (fever, stiff neck, eye pain, sore throat, cold symptoms)     Neck stiff, back hurts, eye pain, sinus congestion, body aches (freezing cold yesterday, last night) 10. PREGNANCY: "Is there any chance you are pregnant?" "When was your last menstrual period?"       No  Protocols used: HEADACHE-A-AH

## 2018-11-03 NOTE — Progress Notes (Signed)
Subjective:    Patient ID: Kristina Mccoy, female    DOB: 04/18/1956, 63 y.o.   MRN: [id number]  HPI   Patient presents to clinic complaining of headache, chills, neck pain, hot flashes during the night and during the day, feeling achy all over off and on for the past month.  The symptoms seem to be worse 2 days ago.  States she did not want to get out of bed due to having neck and head pain that did not seem to go away even with Tylenol or tramadol.  States she has been blowing out thick yellow nasal drainage with some blood streaks.  Patient states this is not the worst headache of her life.  Denies any vision changes, issues with speech, one-sided extremity weakness.  Denies cough, denies chest pain.  Denies shortness of breath or wheezing.  States neck pain since seems better, headache is slightly improved today.  Patient has been battling nasal congestion and sinus infection since December 2019.  Symptoms do not seem to want to improve, or they would get better for a few days and then slowly begin to return.  Patient does have Sjogren's syndrome, oftentimes this makes it more difficult for her to fully get over an illness.   Patient Active Problem List   Diagnosis Date Noted  . Viral illness 08/31/2018  . Hair loss 08/31/2018  . Bilateral leg cramps 05/27/2018  . Memory difficulty 04/27/2018  . Iliotibial band syndrome 04/27/2018  . Respiratory illness 04/27/2018  . Allergic rhinitis 01/26/2018  . RUQ pain 10/28/2017  . Obesity (BMI 30.0-34.9) 10/28/2017  . Pain in limb 06/03/2017  . Chest pain 01/17/2017  . Throat fullness 10/15/2016  . 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 Rash 08/08/2016  . Lower abdominal pain 08/08/2016  . Cephalalgia 01/26/2016  . Dysuria 12/20/2015  . Vitamin D deficiency 12/20/2015  . Sjogren's syndrome ([place]) 12/20/2015  . Chronic fatigue 12/04/2015  . Insomnia 10/19/2015  . Chronic bilateral low back pain without sciatica 11/07/2014    . OSA on CPAP 10/19/2014  . Anxiety and depression 08/16/2014  . Diabetes type 2, uncontrolled ([place]) 07/06/2013  . Malignant neoplasm of breast (female) ([place]) 02/28/2013  . Blood clotting disorder ([place]) 12/01/2012  . Hyperlipidemia 12/01/2012  . Chronic pain disorder 06/11/2012  . Hearing loss 09/16/2011   Social History   Tobacco Use  . Smoking status: Former Smoker    Packs/day: 1.00    Years: 30.00    Pack years: 30.00    Types: Cigarettes    Last attempt to quit: 02/28/2013    Years since quitting: 5.6  . Smokeless tobacco: Never Used  Substance Use Topics  . Alcohol use: No    Alcohol/week: 0.0 standard drinks    Review of Systems   Constitutional: +chills, fatigue and fever.  HENT: +congestion, ear pain, sinus pain.   Eyes: Negative.   Respiratory: Negative for cough, shortness of breath and wheezing.   Cardiovascular: Negative for chest pain, palpitations and leg swelling.  Gastrointestinal: Negative for abdominal pain, diarrhea, nausea and vomiting.  Genitourinary: Negative for dysuria, frequency and urgency.  Musculoskeletal: +body aches, neck pain.  Skin: Negative for color change, pallor and rash.  Neurological: Negative for syncope, light-headedness. +headaches.  Psychiatric/Behavioral: The patient is not nervous/anxious.       Objective:   Physical Exam Vitals signs and nursing note reviewed.  Constitutional:      General: She is not in acute distress.    Appearance:  She is not ill-appearing, toxic-appearing or diaphoretic.  HENT:     Head: Normocephalic and atraumatic.     Nose: Mucosal edema, congestion and rhinorrhea present.     Right Turbinates: Swollen.     Left Turbinates: Swollen.     Right Sinus: Maxillary sinus tenderness and frontal sinus tenderness present.     Left Sinus: Maxillary sinus tenderness and frontal sinus tenderness present.     Comments: Nares appear inflamed and irritated.  Positive thick yellow nasal drainage.  Positive  postnasal drip.    Mouth/Throat:     Mouth: Mucous membranes are moist.  Eyes:     General: No scleral icterus.    Extraocular Movements: Extraocular movements intact.     Pupils: Pupils are equal, round, and reactive to light.  Neck:     Musculoskeletal: Normal range of motion and neck supple. No neck rigidity.  Cardiovascular:     Rate and Rhythm: Normal rate and regular rhythm.  Pulmonary:     Effort: Pulmonary effort is normal. No respiratory distress.     Breath sounds: Normal breath sounds. No wheezing, rhonchi or rales.  Lymphadenopathy:     Cervical: No cervical adenopathy.  Skin:    General: Skin is warm and dry.     Coloration: Skin is not pale.  Neurological:     Mental Status: She is alert and oriented to person, place, and time.     Gait: Gait normal.  Psychiatric:        Mood and Affect: Mood normal.        Speech: Speech normal.        Behavior: Behavior normal.    Today's Vitals   11/03/18 1524  BP: 132/70  Pulse: 83  Resp: 18  Temp: 98.8 F (37.1 C)  TempSrc: Oral  SpO2: 98%  Weight: 167 lb 3.2 oz (75.8 kg)  Height: 5' 3.5" (1.613 m)   Body mass index is 29.15 kg/m.     Assessment & Plan:   Chronic sinusitis, acute exacerbation - due to recurrence of sinusitis, patient and I discussed possibility of seeing ENT.  At this time patient would like to hold off until after she sees her rheumatologist on Friday.  Patient is agreeable to Toradol injection in clinic today to help reduce pain.  She will also get 1 g of Rocephin to treat sinus infection.  I offered one-time dose of IM steroid in office, but patient declined due to her diabetes and steroids begin her blood sugars become elevated.  advised to do saline nasal mist to help reduce congestion in nasal area.  Patient given return precautions and advised if she develops a severe headache that would be described as the worst headache of her life, has any changes in neurological status she needs to call  office right away and/or go to emergency room for evaluation.  Administrations This Visit    cefTRIAXone (ROCEPHIN) injection 500 mg    Admin Date 11/03/2018 Action Given Dose 500 mg Route Intramuscular Administered By Neta Ehlers, RMA        Admin Date 11/03/2018 Action Given Dose 500 mg Route Intramuscular Administered By Neta Ehlers, RMA       ketorolac (TORADOL) 30 MG/ML injection 30 mg    Admin Date 11/03/2018 Action Given Dose 30 mg Route Intramuscular Administered By Neta Ehlers, RMA          Patient will keep regularly scheduled follow-up PCP as planned.  She will keep her  appointment with rheumatology.  If sinus symptoms continue to persist, patient is agreeable to see ENT.

## 2018-11-06 DIAGNOSIS — M3500 Sicca syndrome, unspecified: Secondary | ICD-10-CM | POA: Diagnosis not present

## 2018-11-23 ENCOUNTER — Ambulatory Visit (INDEPENDENT_AMBULATORY_CARE_PROVIDER_SITE_OTHER): Payer: PPO | Admitting: Family Medicine

## 2018-11-23 ENCOUNTER — Encounter: Payer: Self-pay | Admitting: Family Medicine

## 2018-11-23 VITALS — BP 122/82 | HR 77 | Temp 98.0°F | Resp 18 | Ht 63.5 in | Wt 168.8 lb

## 2018-11-23 DIAGNOSIS — J329 Chronic sinusitis, unspecified: Secondary | ICD-10-CM

## 2018-11-23 DIAGNOSIS — R059 Cough, unspecified: Secondary | ICD-10-CM

## 2018-11-23 DIAGNOSIS — R062 Wheezing: Secondary | ICD-10-CM

## 2018-11-23 DIAGNOSIS — R05 Cough: Secondary | ICD-10-CM | POA: Diagnosis not present

## 2018-11-23 LAB — POCT RAPID STREP A (OFFICE): Rapid Strep A Screen: NEGATIVE

## 2018-11-23 LAB — POC INFLUENZA A&B (BINAX/QUICKVUE)
Influenza A, POC: NEGATIVE
Influenza B, POC: NEGATIVE

## 2018-11-23 MED ORDER — CEFTRIAXONE SODIUM 1 G IJ SOLR
1.0000 g | Freq: Once | INTRAMUSCULAR | Status: AC
  Administered 2018-11-23: 1 g via INTRAMUSCULAR

## 2018-11-23 MED ORDER — KETOROLAC TROMETHAMINE 60 MG/2ML IM SOLN
60.0000 mg | Freq: Once | INTRAMUSCULAR | Status: AC
  Administered 2018-11-23: 60 mg via INTRAMUSCULAR

## 2018-11-23 MED ORDER — ALBUTEROL SULFATE HFA 108 (90 BASE) MCG/ACT IN AERS: 2.0000 | INHALATION_SPRAY | Freq: Four times a day (QID) | RESPIRATORY_TRACT | 0 refills | Status: DC | PRN

## 2018-11-23 MED ORDER — BENZONATATE 100 MG PO CAPS: 100.0000 mg | ORAL_CAPSULE | Freq: Three times a day (TID) | ORAL | 0 refills | Status: DC | PRN

## 2018-11-23 NOTE — Progress Notes (Signed)
Subjective:    Patient ID: Kristina Mccoy, female    DOB: 09-Apr-1956, 63 y.o.   MRN: [id number]  HPI  Patient Active Problem List   Diagnosis Date Noted  . Viral illness 08/31/2018  . Hair loss 08/31/2018  . Bilateral leg cramps 05/27/2018  . Memory difficulty 04/27/2018  . Iliotibial band syndrome 04/27/2018  . Respiratory illness 04/27/2018  . Allergic rhinitis 01/26/2018  . RUQ pain 10/28/2017  . Obesity (BMI 30.0-34.9) 10/28/2017  . Pain in limb 06/03/2017  . Chest pain 01/17/2017  . Throat fullness 10/15/2016  . 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 Rash 08/08/2016  . Lower abdominal pain 08/08/2016  . Cephalalgia 01/26/2016  . Dysuria 12/20/2015  . Vitamin D deficiency 12/20/2015  . Sjogren's syndrome ([place]) 12/20/2015  . Chronic fatigue 12/04/2015  . Insomnia 10/19/2015  . Chronic bilateral low back pain without sciatica 11/07/2014  . OSA on CPAP 10/19/2014  . Anxiety and depression 08/16/2014  . Diabetes type 2, uncontrolled ([place]) 07/06/2013  . Malignant neoplasm of breast (female) ([place]) 02/28/2013  . Blood clotting disorder ([place]) 12/01/2012  . Hyperlipidemia 12/01/2012  . Chronic pain disorder 06/11/2012  . Hearing loss 09/16/2011   Social History   Tobacco Use  . Smoking status: Former Smoker    Packs/day: 1.00    Years: 30.00    Pack years: 30.00    Types: Cigarettes    Last attempt to quit: 02/28/2013    Years since quitting: 5.7  . Smokeless tobacco: Never Used  Substance Use Topics  . Alcohol use: No    Alcohol/week: 0.0 standard drinks   Review of Systems   Constitutional: Negative for chills, fatigue and fever.  HENT: +sinus congestion, sinus pain, sinus headache Eyes: Negative.   Respiratory: +cough, wheezes at night when laying down. Denies feeling SOB  Cardiovascular: Negative for chest pain, palpitations and leg swelling.  Gastrointestinal: Negative for abdominal pain, diarrhea, nausea and vomiting.  Genitourinary: Negative  for dysuria, frequency and urgency.  Musculoskeletal: Negative for arthralgias and myalgias.  Skin: Negative for color change, pallor and rash.  Neurological: Negative for syncope, light-headedness and headaches.  Psychiatric/Behavioral: The patient is not nervous/anxious.       Objective:   Physical Exam Vitals signs and nursing note reviewed.  Constitutional:      General: She is not in acute distress.    Appearance: She is not toxic-appearing or diaphoretic.  HENT:     Head: Normocephalic and atraumatic.     Right Ear: There is no impacted cerumen.     Left Ear: There is no impacted cerumen.     Ears:     Comments: Fulness bilat TMs    Nose: Nasal tenderness, congestion and rhinorrhea present.     Right Sinus: Maxillary sinus tenderness and frontal sinus tenderness present.     Left Sinus: Maxillary sinus tenderness and frontal sinus tenderness present.     Mouth/Throat:     Mouth: Mucous membranes are moist.     Pharynx: No oropharyngeal exudate or posterior oropharyngeal erythema.  Eyes:     General: No scleral icterus.    Extraocular Movements: Extraocular movements intact.     Conjunctiva/sclera: Conjunctivae normal.     Pupils: Pupils are equal, round, and reactive to light.  Neck:     Musculoskeletal: Neck supple. No neck rigidity.  Cardiovascular:     Rate and Rhythm: Normal rate and regular rhythm.  Pulmonary:     Effort: Pulmonary effort is normal. No  respiratory distress.     Breath sounds: No wheezing, rhonchi or rales.  Lymphadenopathy:     Cervical: No cervical adenopathy.  Skin:    General: Skin is warm and dry.     Coloration: Skin is not pale.  Neurological:     Mental Status: She is alert and oriented to person, place, and time.  Psychiatric:        Mood and Affect: Mood normal.        Behavior: Behavior normal.    Today's Vitals   11/23/18 1331  BP: 122/82  Pulse: 77  Resp: 18  Temp: 98 F (36.7 C)  TempSrc: Oral  SpO2: 99%  Weight: 168  lb 12.8 oz (76.6 kg)  Height: 5' 3.5" (1.613 m)   Body mass index is 29.43 kg/m.     Assessment & Plan:   Chronic sinusitis-patient will get IM Rocephin x1 in clinic.  Prefers IM injection over oral medications due to injection not giving her yeast infection or upsetting stomach.  ENT referral placed due to chronic issues with sinuses.  She will get IM toradol to help sinus pain, this was helpful in past. She will do saline nasal rinses to help reduce congestion and also advised that she can use Nasonex nasal spray.  Advised to increase fluid intake, get plenty of rest and do good handwashing.  Administrations This Visit    cefTRIAXone (ROCEPHIN) injection 1 g    Admin Date 11/23/2018 Action Given Dose 1 g Route Intramuscular Administered By Neta Ehlers, RMA       ketorolac (TORADOL) injection 60 mg    Admin Date 11/23/2018 Action Given Dose 60 mg Route Intramuscular Administered By Neta Ehlers, RMA         Cough/wheezes-suspect cough is related to postnasal drainage.  No wheezes heard upon lung auscultation in clinic, but patient given albuterol inhaler to use if needed for shortness of breath or wheezing symptoms.  She will try Tessalon Perles as needed for cough, advised that these are not effective she can use Mucinex as an alternative option.  Point-of-care flu and strep swabs are negative in clinic.  Patient aware she will be contacted in regards to ENT referral.  She will keep regularly scheduled follow-up with PCP as planned.  She will return to clinic sooner if any issues arise.

## 2018-11-23 NOTE — Patient Instructions (Signed)
Try tessalon perles for cough, if not effected a great OTC option is mucinex  Use saline nasal rinse to reduce congestion, ear pain and also can use nasonex nasal spray

## 2018-11-26 ENCOUNTER — Encounter: Payer: Self-pay | Admitting: Family Medicine

## 2018-11-30 ENCOUNTER — Other Ambulatory Visit: Payer: Self-pay | Admitting: Family Medicine

## 2018-11-30 NOTE — Telephone Encounter (Signed)
Pt has a scheduled appt.  Kristina Mccoy

## 2018-11-30 NOTE — Telephone Encounter (Signed)
Called to ask pt if she has enough mediction to hold her until Dr. Caryl Bis returns on next tues.  [email or web address]. pec can advise.  Gae Bon, CMA

## 2018-11-30 NOTE — Telephone Encounter (Signed)
Call patient It appears that she last filled her Xanax 2/22.  This should have been a month supply.  She would be due for another refill on 3/22.  Clarify with patient.  I looked up patient on [place] Controlled Substances Reporting System and saw no activity that raised concern of inappropriate use.

## 2018-12-01 ENCOUNTER — Ambulatory Visit (INDEPENDENT_AMBULATORY_CARE_PROVIDER_SITE_OTHER): Payer: PPO

## 2018-12-01 ENCOUNTER — Encounter: Payer: Self-pay | Admitting: Family Medicine

## 2018-12-01 ENCOUNTER — Ambulatory Visit (INDEPENDENT_AMBULATORY_CARE_PROVIDER_SITE_OTHER): Payer: PPO | Admitting: Family Medicine

## 2018-12-01 ENCOUNTER — Other Ambulatory Visit: Payer: Self-pay

## 2018-12-01 VITALS — BP 138/82 | HR 71 | Temp 98.1°F | Resp 16 | Ht 63.5 in | Wt 170.6 lb

## 2018-12-01 DIAGNOSIS — R05 Cough: Secondary | ICD-10-CM

## 2018-12-01 DIAGNOSIS — R059 Cough, unspecified: Secondary | ICD-10-CM

## 2018-12-01 DIAGNOSIS — B373 Candidiasis of vulva and vagina: Secondary | ICD-10-CM

## 2018-12-01 DIAGNOSIS — B3731 Acute candidiasis of vulva and vagina: Secondary | ICD-10-CM

## 2018-12-01 DIAGNOSIS — R0602 Shortness of breath: Secondary | ICD-10-CM

## 2018-12-01 DIAGNOSIS — J329 Chronic sinusitis, unspecified: Secondary | ICD-10-CM | POA: Diagnosis not present

## 2018-12-01 MED ORDER — ALPRAZOLAM 0.5 MG PO TABS: 0.5000 mg | ORAL_TABLET | Freq: Four times a day (QID) | ORAL | 0 refills | Status: DC | PRN

## 2018-12-01 MED ORDER — LEVOFLOXACIN 500 MG PO TABS: 500.0000 mg | ORAL_TABLET | Freq: Every day | ORAL | 0 refills | Status: DC

## 2018-12-01 MED ORDER — FLUCONAZOLE 150 MG PO TABS: ORAL_TABLET | ORAL | 0 refills | Status: DC

## 2018-12-01 NOTE — Telephone Encounter (Signed)
Pt will run out of medication soon and has appt today with you, this is sonneberge's pt, she will bring her bottle with her, just a FYI that she is needing this medication.  Lesia Hausen

## 2018-12-01 NOTE — Progress Notes (Signed)
Subjective:    Patient ID: Kristina Mccoy, female    DOB: 05-26-1956, 63 y.o.   MRN: [id number]  HPI   Patient presents to clinic due to continued sinus pressure, headache, yellow drainage and also states over the past week she has developed shortness of breath and a cough.  Patient states she feels completely exhausted.  She does have a ENT appointment scheduled for 12/08/2018.  Patient is concerned because she will seem to get better for 3 or 4 days after coming to office and getting a shot of Rocephin and Toradol, but then symptoms will slowly begin to return.  She is also concerned with the development of cough and shortness of breath because she did have a really bad pneumonia back in 2015 that landed her in the hospital.  Patient Active Problem List   Diagnosis Date Noted  . Viral illness 08/31/2018  . Hair loss 08/31/2018  . Bilateral leg cramps 05/27/2018  . Memory difficulty 04/27/2018  . Iliotibial band syndrome 04/27/2018  . Respiratory illness 04/27/2018  . Allergic rhinitis 01/26/2018  . RUQ pain 10/28/2017  . Obesity (BMI 30.0-34.9) 10/28/2017  . Pain in limb 06/03/2017  . Chest pain 01/17/2017  . Throat fullness 10/15/2016  . 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 Rash 08/08/2016  . Lower abdominal pain 08/08/2016  . Cephalalgia 01/26/2016  . Dysuria 12/20/2015  . Vitamin D deficiency 12/20/2015  . Sjogren's syndrome ([place]) 12/20/2015  . Chronic fatigue 12/04/2015  . Insomnia 10/19/2015  . Chronic bilateral low back pain without sciatica 11/07/2014  . OSA on CPAP 10/19/2014  . Anxiety and depression 08/16/2014  . Diabetes type 2, uncontrolled ([place]) 07/06/2013  . Malignant neoplasm of breast (female) ([place]) 02/28/2013  . Blood clotting disorder ([place]) 12/01/2012  . Hyperlipidemia 12/01/2012  . Chronic pain disorder 06/11/2012  . Hearing loss 09/16/2011   Social History   Tobacco Use  . Smoking status: Former Smoker    Packs/day: 1.00    Years: 30.00     Pack years: 30.00    Types: Cigarettes    Last attempt to quit: 02/28/2013    Years since quitting: 5.7  . Smokeless tobacco: Never Used  Substance Use Topics  . Alcohol use: No    Alcohol/week: 0.0 standard drinks   Review of Systems   Constitutional: Negative for chills, fatigue and fever.  HENT: +congestion, ear pain, sinus pain and sore throat.   Eyes: Negative.   Respiratory: +cough, shortness of breath and wheezing.   Cardiovascular: Negative for chest pain, palpitations and leg swelling.  Gastrointestinal: Negative for abdominal pain, diarrhea, nausea and vomiting.  Genitourinary: Negative for dysuria, frequency and urgency.  Musculoskeletal: Negative for arthralgias and myalgias.  Skin: Negative for color change, pallor and rash.  Neurological: Negative for syncope, light-headedness and headaches.  Psychiatric/Behavioral: The patient is not nervous/anxious.       Objective:   Physical Exam Vitals signs and nursing note reviewed.  Constitutional:      General: She is not in acute distress.    Appearance: She is not toxic-appearing or diaphoretic.  HENT:     Head: Normocephalic and atraumatic.     Right Ear: There is no impacted cerumen.     Left Ear: There is no impacted cerumen.     Nose: Nasal tenderness, congestion and rhinorrhea present.     Right Sinus: Maxillary sinus tenderness and frontal sinus tenderness present.     Left Sinus: Maxillary sinus tenderness and frontal sinus tenderness  present.     Mouth/Throat:     Mouth: Mucous membranes are moist.     Pharynx: No oropharyngeal exudate or posterior oropharyngeal erythema.  Eyes:     General: No scleral icterus.    Extraocular Movements: Extraocular movements intact.     Conjunctiva/sclera: Conjunctivae normal.     Pupils: Pupils are equal, round, and reactive to light.  Neck:     Musculoskeletal: Neck supple. No neck rigidity.  Cardiovascular:     Rate and Rhythm: Normal rate and regular rhythm.    Pulmonary:     Effort: Pulmonary effort is normal. No respiratory distress.     Breath sounds: No wheezing, rhonchi or rales.     Comments: +Dry cough  Lungs sound clear. Lymphadenopathy:     Cervical: No cervical adenopathy.  Skin:    General: Skin is warm and dry.     Coloration: Skin is not pale.  Neurological:     Mental Status: She is alert and oriented to person, place, and time.  Psychiatric:        Mood and Affect: Mood normal.        Behavior: Behavior normal.    Vitals:   12/01/18 1355  BP: 138/82  Pulse: 71  Resp: 16  Temp: 98.1 F (36.7 C)  SpO2: 98%       Assessment & Plan:    Chronic sinusitis - patient continues to have chronic sinus issues.  She will take Levaquin course to treat.  She will also keep already planned evaluation with ENT.  Cough/SOB - we will get chest x-ray to rule out pneumonia.  Levaquin course for chronic sinusitis will cover a pneumonia if needed.  Reassured patient that the Rocephin injection she received at previous ER visit will also provide coverage to pneumonia if it were to be present.  Vaginal Candida - patient does get vaginal yeast infections often with oral antibiotics, Diflucan course sent in.   Patient will keep already planned follow-up with PCP as planned and return to clinic sooner if any issues arise.

## 2018-12-01 NOTE — Telephone Encounter (Signed)
Pt. Reports her Xanax bottle date of refill is 11/05/18 and is due for refill 12/04/18. Has an appointment today and will bring in the bottle.

## 2018-12-01 NOTE — Telephone Encounter (Signed)
Sent to Lauren pt will be coming in today to see you

## 2018-12-02 ENCOUNTER — Encounter: Payer: Self-pay | Admitting: Family Medicine

## 2018-12-08 DIAGNOSIS — M3501 Sicca syndrome with keratoconjunctivitis: Secondary | ICD-10-CM | POA: Diagnosis not present

## 2018-12-08 DIAGNOSIS — J328 Other chronic sinusitis: Secondary | ICD-10-CM | POA: Diagnosis not present

## 2018-12-08 DIAGNOSIS — J301 Allergic rhinitis due to pollen: Secondary | ICD-10-CM | POA: Diagnosis not present

## 2018-12-08 DIAGNOSIS — E785 Hyperlipidemia, unspecified: Secondary | ICD-10-CM | POA: Diagnosis not present

## 2018-12-08 DIAGNOSIS — Z794 Long term (current) use of insulin: Secondary | ICD-10-CM | POA: Diagnosis not present

## 2018-12-08 DIAGNOSIS — E1169 Type 2 diabetes mellitus with other specified complication: Secondary | ICD-10-CM | POA: Diagnosis not present

## 2018-12-08 DIAGNOSIS — G4733 Obstructive sleep apnea (adult) (pediatric): Secondary | ICD-10-CM | POA: Diagnosis not present

## 2018-12-08 DIAGNOSIS — E1165 Type 2 diabetes mellitus with hyperglycemia: Secondary | ICD-10-CM | POA: Diagnosis not present

## 2018-12-29 ENCOUNTER — Other Ambulatory Visit: Payer: Self-pay | Admitting: Family Medicine

## 2018-12-30 NOTE — Telephone Encounter (Signed)
Last OV 12/01/2018 with Lauren Guse   Last OV with PCP 08/31/2018   Last refilled 10/16/2018 disp 120 with 1 refill   Next OV 01/01/2019   Sent to PCP for approval

## 2018-12-31 ENCOUNTER — Telehealth: Payer: Self-pay

## 2018-12-31 NOTE — Telephone Encounter (Signed)
Last OV 12/01/2018 with Philis Nettle   Last refilled 12/01/2018 disp 120 with 1 refill   Two Rx's we sent on that same date   Next appt 01/01/2019  Call pharmacy

## 2018-12-31 NOTE — Telephone Encounter (Signed)
Called the pharmacy they only received the Rx that had a disp amount of 120 pills with NO refills   Sent to PCP for approval pt is due for a refill pharmacy has nothing on file

## 2018-12-31 NOTE — Telephone Encounter (Signed)
Copied from [facility] [id number]. Topic: Appointment Scheduling - Scheduling Inquiry for Clinic >> Dec 30, 2018  5:04 PM Kristina Mccoy A wrote: Reason for CRM: Pt called in and left vm on general mailbox -  she was calling to resch appt with [name] number [phone]

## 2019-01-01 ENCOUNTER — Ambulatory Visit (INDEPENDENT_AMBULATORY_CARE_PROVIDER_SITE_OTHER): Payer: PPO | Admitting: Family Medicine

## 2019-01-01 ENCOUNTER — Encounter: Payer: Self-pay | Admitting: Family Medicine

## 2019-01-01 ENCOUNTER — Other Ambulatory Visit: Payer: Self-pay

## 2019-01-01 DIAGNOSIS — R0600 Dyspnea, unspecified: Secondary | ICD-10-CM | POA: Diagnosis not present

## 2019-01-01 DIAGNOSIS — C50919 Malignant neoplasm of unspecified site of unspecified female breast: Secondary | ICD-10-CM | POA: Diagnosis not present

## 2019-01-01 DIAGNOSIS — E1165 Type 2 diabetes mellitus with hyperglycemia: Secondary | ICD-10-CM

## 2019-01-01 DIAGNOSIS — G894 Chronic pain syndrome: Secondary | ICD-10-CM

## 2019-01-01 DIAGNOSIS — J329 Chronic sinusitis, unspecified: Secondary | ICD-10-CM | POA: Diagnosis not present

## 2019-01-01 DIAGNOSIS — M898X9 Other specified disorders of bone, unspecified site: Secondary | ICD-10-CM

## 2019-01-01 DIAGNOSIS — R748 Abnormal levels of other serum enzymes: Secondary | ICD-10-CM

## 2019-01-01 DIAGNOSIS — Z853 Personal history of malignant neoplasm of breast: Secondary | ICD-10-CM

## 2019-01-01 DIAGNOSIS — E785 Hyperlipidemia, unspecified: Secondary | ICD-10-CM | POA: Diagnosis not present

## 2019-01-01 NOTE — Progress Notes (Signed)
Virtual Visit via Telephone Note  I connected with Kristina Mccoy on 01/05/19 at 11:00 AM EDT by telephone and verified that I am speaking with the correct person using two identifiers.   I discussed the limitations, risks, security and privacy concerns of performing an evaluation and management service by telephone and the availability of in person appointments. I also discussed with the patient that there may be a patient responsible charge related to this service. The patient expressed understanding and agreed to proceed.  The Patient Kristina Mccoy) was at home and I [name]) was in the office. We participated in this visit.   Interactive audio and video telecommunications were attempted between this provider and patient, however failed, due to patient having technical difficulties OR patient did not have access to video capability.  We continued and completed visit with audio only.   Reason for visit: 4-month follow-up.  History of Present Illness: Chronic pain: Patient notes tramadol does help.  She occasionally has to take 2 at a time to help.  She notes her arms and knees and wrists and legs have been hurting her much more significantly over the last 2 months.  She is on tamoxifen and does have history of breast cancer though she does not take the tamoxifen with the frequency that it is prescribed.  She does note quite a bit of fatigue as well.  Hyperlipidemia: She is not been on any medications recently.   Diabetes: A1c recently 8.7.  She sees endocrinology and they manage her insulin.  They did not make any changes as she was having some low sugars with this.  Recurrent sinusitis: Patient notes she was tried on multiple antibiotics through our clinic and then referred to ENT and given Levaquin which resulted in myalgias.  She subsequently finished a course of clindamycin which did help some.  She had a scope through ENT though they could not get it through her left nasal  cavity and they are planning on doing a CT scan though that has been put off at this time given coronavirus.  Dyspnea: The patient does note some shortness of breath and tightness in her chest at times.  This has been going on to some degree with the sinus issues.  She can just be sitting there and have this occur though it can also occur with exertion.  Her inhaler does help some.  She does note some wheezing.  She does note rest improves the tightness and shortness of breath when she is exerting herself.  No diaphoresis or radiation.  Last occurred 2 days ago.  Does not occur all the time.  She does follow with cardiology and pulmonology.   Observations/Objective: No physical exam was completed given this was a telehealth visit.  The patient was not audibly short of breath over the phone.  She was speaking in full sentences.  Assessment and Plan: Diabetes type 2, uncontrolled ([place]) She will continue to follow with endocrinology.  They are managing her diabetic medications.  Chronic pain disorder Patient continues to have issues with chronic pain.  She will continue on tramadol.  Her worsening pain could be related to her tamoxifen or some other cause.  Given her history of breast cancer I did discuss obtaining a nuclear medicine bone scan to rule out underlying bony issue particularly given her elevated alkaline phosphatase.  Malignant neoplasm of breast (female) [facility]) Patient continues to follow with oncology for this.  She has had increased bone pain which could  be related to her tamoxifen though could also be related to an underlying issue related to her breast cancer.  We will obtain a nuclear medicine bone scan to evaluate.  Hyperlipidemia We will defer treatment with a statin at this time given the patient's issues with pain.  Consider starting on a low dose of medication once her bone scan is completed.  Recurrent sinusitis She will continue to follow with ENT.  Dyspnea This is  concerning for cardiac cause.  Discussed evaluation with cardiology.  She noted she would contact her cardiologist to set up an appointment. Given reasons to seek medical care.     Follow Up Instructions: F/u in the office in 3-4 months.    I discussed the assessment and treatment plan with the patient. The patient was provided an opportunity to ask questions and all were answered. The patient agreed with the plan and demonstrated an understanding of the instructions.   The patient was advised to call back or seek an in-person evaluation if the symptoms worsen or if the condition fails to improve as anticipated.  I provided 26 minutes of non-face-to-face time during this encounter.   [name], MD

## 2019-01-05 ENCOUNTER — Telehealth: Payer: Self-pay

## 2019-01-05 DIAGNOSIS — J329 Chronic sinusitis, unspecified: Secondary | ICD-10-CM | POA: Insufficient documentation

## 2019-01-05 DIAGNOSIS — R0602 Shortness of breath: Secondary | ICD-10-CM | POA: Diagnosis not present

## 2019-01-05 NOTE — Assessment & Plan Note (Signed)
We will defer treatment with a statin at this time given the patient's issues with pain.  Consider starting on a low dose of medication once her bone scan is completed.

## 2019-01-05 NOTE — Assessment & Plan Note (Signed)
She will continue to follow with endocrinology.  They are managing her diabetic medications.

## 2019-01-05 NOTE — Assessment & Plan Note (Signed)
Patient continues to have issues with chronic pain.  She will continue on tramadol.  Her worsening pain could be related to her tamoxifen or some other cause.  Given her history of breast cancer I did discuss obtaining a nuclear medicine bone scan to rule out underlying bony issue particularly given her elevated alkaline phosphatase.

## 2019-01-05 NOTE — Telephone Encounter (Signed)
Please schedule follow-up for 3-4 months from now. I have placed the order for the patients bone scan. I had to determine an appropriate diagnosis for this. I will forward to [facility] so she can work on getting this scheduled.

## 2019-01-05 NOTE — Assessment & Plan Note (Signed)
She will continue to follow with ENT.

## 2019-01-05 NOTE — Assessment & Plan Note (Signed)
Patient continues to follow with oncology for this.  She has had increased bone pain which could be related to her tamoxifen though could also be related to an underlying issue related to her breast cancer.  We will obtain a nuclear medicine bone scan to evaluate.

## 2019-01-05 NOTE — Telephone Encounter (Signed)
Please see my note below. Please also find out if she was going to contact her cardiologist to set up follow-up for her chest tightness and dyspnea. Thanks.

## 2019-01-05 NOTE — Telephone Encounter (Signed)
Called and spoke with patient. Pt advised and voiced understanding. Pt wanted to know if she would do the 1 month follow up lab work here rather than [facility]? Sent to PCP to advise. Placed lab orders if OK and I will get patient scheduled for lab appt.   Thanks

## 2019-01-05 NOTE — Telephone Encounter (Signed)
They said without it being ordered as stat the earliest they can do is 3-4 wks.  Melissa

## 2019-01-05 NOTE — Telephone Encounter (Signed)
There is the potential that Crestor could cause her liver function test to increase.  I would suggest that she have them rechecked about 1 month after starting on the medication.

## 2019-01-05 NOTE — Telephone Encounter (Signed)
Called and spoke with pt. Pt advised. Pt has talked to her cardiologist this morning Dr.Rockman has sent in two medications for cholesterol one for cholesterol and the other was for harding of the arteries per pt. The cholesterol medication has been sent per pt. One prescription was for Crestor. Pt wanted to know will this medication cause any issues with her liver problems

## 2019-01-05 NOTE — Telephone Encounter (Signed)
FYI pt stated that she is going to be scheduled for an MRI nuclear stress test per Cardiologist this has not been scheduled yet but is being done due to her symptoms an medical Hx.   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

## 2019-01-05 NOTE — Telephone Encounter (Signed)
She can do the lab work here if needed. Can her bone scan be moved up sooner?

## 2019-01-05 NOTE — Telephone Encounter (Signed)
Copied from [place] [id number]. Topic: Appointment Scheduling - Scheduling Inquiry for Clinic >> Jan 05, 2019  7:28 AM Rayann Heman wrote: Reason for CRM:pt called and stated that she would like to schedule follow up visit and to schedule a bone Scan. Please advise

## 2019-01-05 NOTE — Assessment & Plan Note (Signed)
This is concerning for cardiac cause.  Discussed evaluation with cardiology.  She noted she would contact her cardiologist to set up an appointment. Given reasons to seek medical care.

## 2019-01-05 NOTE — Telephone Encounter (Signed)
She is scheduled April 30 for her whole body bone scan. Thanks! [employer]

## 2019-01-06 NOTE — Telephone Encounter (Signed)
I have changed the order to stat. Could you get this changed?

## 2019-01-06 NOTE — Addendum Note (Signed)
Addended by: [name] on: 01/06/2019 04:36 PM   Modules accepted: Orders

## 2019-01-07 ENCOUNTER — Telehealth: Payer: Self-pay | Admitting: *Deleted

## 2019-01-07 NOTE — Telephone Encounter (Signed)
Copied from [place] [phone]. Topic: General - Other >> Jan 07, 2019 12:02 PM Ivar Drape wrote: Reason for CRM:  Abby w/[facility] [phone] would like a call back from [facility] concerning the patient.

## 2019-01-07 NOTE — Telephone Encounter (Signed)
Yes, it has been rescheduled to Tuesday April 14.

## 2019-01-12 ENCOUNTER — Ambulatory Visit
Admission: RE | Admit: 2019-01-12 | Discharge: 2019-01-12 | Disposition: A | Discharge status: Home / Self Care | Payer: PPO | Source: Ambulatory Visit | Attending: Family Medicine | Admitting: Family Medicine

## 2019-01-12 ENCOUNTER — Other Ambulatory Visit: Payer: Self-pay

## 2019-01-12 DIAGNOSIS — C50919 Malignant neoplasm of unspecified site of unspecified female breast: Secondary | ICD-10-CM | POA: Diagnosis not present

## 2019-01-12 DIAGNOSIS — R748 Abnormal levels of other serum enzymes: Secondary | ICD-10-CM | POA: Diagnosis not present

## 2019-01-12 DIAGNOSIS — Z853 Personal history of malignant neoplasm of breast: Secondary | ICD-10-CM | POA: Diagnosis not present

## 2019-01-12 DIAGNOSIS — M898X9 Other specified disorders of bone, unspecified site: Secondary | ICD-10-CM | POA: Insufficient documentation

## 2019-01-12 MED ORDER — TECHNETIUM TC 99M MEDRONATE IV KIT
22.8300 | PACK | Freq: Once | INTRAVENOUS | Status: AC | PRN
  Administered 2019-01-12: 22.83 via INTRAVENOUS

## 2019-01-18 ENCOUNTER — Encounter: Payer: Self-pay | Admitting: *Deleted

## 2019-01-28 ENCOUNTER — Other Ambulatory Visit: Payer: Self-pay | Admitting: Family Medicine

## 2019-01-28 ENCOUNTER — Other Ambulatory Visit: Payer: Self-pay

## 2019-01-28 NOTE — Telephone Encounter (Signed)
Last OV 01/01/2019  Last refilled  120 tablet 0 12/31/2018     Next OV 04/16/2019  Sent to covering provider Lauren Guse   Pt stated that she would like it refilled since she will be out on May the 2

## 2019-01-29 ENCOUNTER — Other Ambulatory Visit: Payer: Self-pay | Admitting: Family Medicine

## 2019-01-30 ENCOUNTER — Ambulatory Visit (INDEPENDENT_AMBULATORY_CARE_PROVIDER_SITE_OTHER): Payer: PPO

## 2019-01-30 ENCOUNTER — Ambulatory Visit
Admission: EM | Admit: 2019-01-30 | Discharge: 2019-01-30 | Disposition: A | Discharge status: Home / Self Care | Payer: PPO | Attending: Family Medicine | Admitting: Family Medicine

## 2019-01-30 ENCOUNTER — Encounter: Payer: Self-pay | Admitting: Emergency Medicine

## 2019-01-30 ENCOUNTER — Other Ambulatory Visit: Payer: Self-pay

## 2019-01-30 DIAGNOSIS — B349 Viral infection, unspecified: Secondary | ICD-10-CM

## 2019-01-30 DIAGNOSIS — R51 Headache: Secondary | ICD-10-CM

## 2019-01-30 DIAGNOSIS — R05 Cough: Secondary | ICD-10-CM

## 2019-01-30 DIAGNOSIS — Z87891 Personal history of nicotine dependence: Secondary | ICD-10-CM

## 2019-01-30 NOTE — ED Triage Notes (Signed)
Patient c/o rash on forehead that started on Wednesday. She states now she has a rash on her arms. She also has a cough that started today. Reports chills that started yesterday.

## 2019-01-30 NOTE — ED Provider Notes (Signed)
[facility]    CSN: [id number] Arrival date & time: 01/30/19  1237     History   Chief Complaint Chief Complaint  Patient presents with  . Rash  . Chills  . Cough    HPI Kristina Mccoy is a 63 y.o. female.   The history is provided by the patient.  Rash  Location:  Face and shoulder/arm Facial rash location:  Forehead Shoulder/arm rash location:  L arm and R arm Cough  Associated symptoms: rash   URI  Presenting symptoms: cough   Presenting symptoms comment:  Chills Severity:  Moderate Duration:  2 days Timing:  Constant Chronicity:  New Ineffective treatments:  None tried Risk factors: no recent travel     Past Medical History:  Diagnosis Date  . Abdominal pain, epigastric   . Abnormal chest CT 09/08/2014  . Allergic urticaria   . Blood clotting disorder ([place])   . Breast cancer ([place]) 5/14   left breast  . Breast cancer ([place]) 02/2013   Dr Laurance Flatten ([place])  . Diabetes mellitus   . Diverticulosis   . Fibromyalgia   . GERD (gastroesophageal reflux disease)   . Heart murmur   . Hyperlipidemia   . Lymphedema of arm    right  . Myalgia   . Myositis   . Neuromyositis   . Otitis media, chronic   . Pain syndrome, chronic   . Peptic ulcer   . Sicca syndrome ([place])   . Sjogren's disease ([place])   . Sleep apnea   . SOB (shortness of breath)   . Systemic involvement of connective tissue [facility])     Patient Active Problem List   Diagnosis Date Noted  . Recurrent sinusitis 01/05/2019  . Viral illness 08/31/2018  . Hair loss 08/31/2018  . Bilateral leg cramps 05/27/2018  . Memory difficulty 04/27/2018  . Iliotibial band syndrome 04/27/2018  . Respiratory illness 04/27/2018  . Allergic rhinitis 01/26/2018  . RUQ pain 10/28/2017  . Obesity (BMI 30.0-34.9) 10/28/2017  . Pain in limb 06/03/2017  . Chest pain 01/17/2017  . Throat fullness 10/15/2016  . 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 Rash 08/08/2016  . Lower abdominal pain  08/08/2016  . Cephalalgia 01/26/2016  . Dysuria 12/20/2015  . Vitamin D deficiency 12/20/2015  . Sjogren's syndrome ([place]) 12/20/2015  . Chronic fatigue 12/04/2015  . Insomnia 10/19/2015  . Chronic bilateral low back pain without sciatica 11/07/2014  . OSA on CPAP 10/19/2014  . Anxiety and depression 08/16/2014  . Dyspnea 05/04/2014  . Diabetes type 2, uncontrolled ([place]) 07/06/2013  . Malignant neoplasm of breast (female) ([place]) 02/28/2013  . Blood clotting disorder ([place]) 12/01/2012  . Hyperlipidemia 12/01/2012  . Chronic pain disorder 06/11/2012  . Hearing loss 09/16/2011    Past Surgical History:  Procedure Laterality Date  . BREAST SURGERY Bilateral    mastectomy  . COLONOSCOPY WITH PROPOFOL N/A 04/22/2017   Procedure: COLONOSCOPY WITH PROPOFOL;  Surgeon: Lollie Sails, MD;  Location: [facility] ENDOSCOPY;  Service: Endoscopy;  Laterality: N/A;  . ESOPHAGOGASTRODUODENOSCOPY (EGD) WITH PROPOFOL N/A 04/22/2017   Procedure: ESOPHAGOGASTRODUODENOSCOPY (EGD) WITH PROPOFOL;  Surgeon: Lollie Sails, MD;  Location: [facility] ENDOSCOPY;  Service: Endoscopy;  Laterality: N/A;  . MASTECTOMY Bilateral 03/23/13    OB History    Gravida  0   Para  0   Term  0   Preterm  0   AB  0   Living  0     SAB  0  TAB  0   Ectopic  0   Multiple  0   Live Births  0            Home Medications    Prior to Admission medications   Medication Sig Start Date End Date Taking? Authorizing Provider  albuterol (PROVENTIL HFA;VENTOLIN HFA) 108 (90 Base) MCG/ACT inhaler Inhale 2 puffs into the lungs every 6 (six) hours as needed for wheezing or shortness of breath. 11/23/18  Yes Guse, Jacquelynn Cree, FNP  ALPRAZolam (XANAX) 0.5 MG tablet TAKE 1 TABLET (0.5 MG TOTAL) BY MOUTH 4 (FOUR) TIMES DAILY AS NEEDED FOR ANXIETY. 01/28/19  Yes Guse, Jacquelynn Cree, FNP  Cholecalciferol (VITAMIN D3) 1000 units CAPS Take by mouth.   Yes [provider]  clobetasol ointment (TEMOVATE) 0.05 % Apply  topically.   Yes [provider]  Cyanocobalamin 1000 MCG CAPS Take by mouth.   Yes [provider]  dicyclomine (BENTYL) 10 MG capsule Take 1 capsule (10 mg total) by mouth 3 (three) times daily as needed for spasms. 04/23/16  Yes Jackolyn Confer, MD  Emollient (CERAVE) CREA Apply topically.   Yes [provider]  FREESTYLE LITE test strip CHECK 6 TIMES DAILY 04/22/16  Yes Jackolyn Confer, MD  glucose blood (ONE TOUCH ULTRA TEST) test strip CHECK SIX TIMES DAILY 04/19/16  Yes Jackolyn Confer, MD  insulin aspart (NOVOLOG FLEXPEN) 100 UNIT/ML FlexPen Take up to 2 - 4 units before meals up to 3 times daily 07/08/17  Yes [provider]  ketoconazole (NIZORAL) 2 % shampoo Apply 1 application topically once a week. 12/25/17  Yes [provider]  nystatin cream (MYCOSTATIN) Apply 1 application topically as needed. 01/06/18  Yes [provider]  ondansetron (ZOFRAN) 4 MG tablet TAKE 1 TABLET BY MOUTH EVERY 8 HOURS AS NEEDED FOR NAUSEA AND VOMITING 10/02/18  Yes Guse, Jacquelynn Cree, FNP  pantoprazole (PROTONIX) 40 MG tablet TAKE 1 TABLET BY MOUTH EVERY DAY 01/29/19  Yes [name], MD  Polyethyl Glycol-Propyl Glycol (SYSTANE OP) Apply to eye.   Yes [provider]  tamoxifen (NOLVADEX) 20 MG tablet Take 20 mg by mouth daily.   Yes [provider]  tiZANidine (ZANAFLEX) 2 MG tablet TAKE ONE TABLET BY MOUTH EVERY 8 HOURS AS NEEDED FOR HEADACHES 11/08/17  Yes [name], MD  traMADol (ULTRAM) 50 MG tablet TAKE 1 TABLET (50 MG TOTAL) BY MOUTH EVERY 6 (SIX) HOURS AS NEEDED. FOR PAIN 12/31/18  Yes [name], MD  TRESIBA FLEXTOUCH 100 UNIT/ML SOPN FlexTouch Pen 20 Units at bedtime. 05/15/18  Yes [provider]  cetirizine (ZYRTEC) 10 MG tablet Take by mouth.    [provider]  cyclobenzaprine (FLEXERIL) 10 MG tablet Take 1 tablet (10 mg total) by mouth 3 (three) times daily as needed for muscle spasms. 11/29/16    [name], MD  cycloSPORINE (RESTASIS) 0.05 % ophthalmic emulsion Place 1 drop into both eyes 4 (four) times daily. 10/06/17   [provider]  fluconazole (DIFLUCAN) 150 MG tablet Take 1st tablet on day 1 and second tablet on day 4. Repeat this course if needed after 1 week. 12/01/18   Guse, Jacquelynn Cree, FNP  fluticasone furoate-vilanterol (BREO ELLIPTA) 100-25 MCG/INH AEPB Inhale 1 puff into the lungs daily. 02/02/18   Wilhelmina Mcardle, MD  hydrOXYzine (ATARAX/VISTARIL) 10 MG tablet Take by mouth. 06/02/14   [provider]  hyoscyamine (LEVSIN, ANASPAZ) 0.125 MG tablet TAKE ONE TABLET  BY MOUTH EVERY 4 HOURS AS NEEDED 04/19/16   Jackolyn Confer, MD  Insulin Syringe-Needle U-100 (INSULIN SYRINGE .5CC/31GX5/16") 31G X 5/16" 0.5 ML MISC Use twice daily with injections 05/27/16   [provider]  ketoconazole (NIZORAL) 2 % cream Apply 1 application topically 2 (two) times daily. 12/25/17   [provider]  Lancets MISC Use. BAYER CONTOUR LANCETS    [provider]  [facility] DELICA LANCETS 44W MISC TWICE DAILY 04/19/16   Jackolyn Confer, MD  ranitidine (ZANTAC) 150 MG tablet Take 150 mg by mouth every evening. 12/23/17   [provider]    Family History Family History  Problem Relation Age of Onset  . Diabetes Mother   . Heart disease Mother   . Hyperlipidemia Mother   . Heart failure Mother   . Diabetes Other   . Heart disease Brother   . Hyperlipidemia Brother   . Colon polyps Brother   . Stroke Maternal Grandmother   . Breast cancer Maternal Aunt   . Colon cancer Paternal Grandmother   . Ovarian cancer Neg Hx     Social History Social History   Tobacco Use  . Smoking status: Former Smoker    Packs/day: 1.00    Years: 30.00    Pack years: 30.00    Types: Cigarettes    Last attempt to quit: 02/28/2013    Years since quitting: 5.9  . Smokeless tobacco: Never Used  Substance Use Topics  . Alcohol use: No    Alcohol/week: 0.0  standard drinks  . Drug use: No     Allergies   Gabapentin; Oxycodone; Carbamazepine; Dulaglutide; Insulin glargine; Levaquin [levofloxacin]; Metformin; Methylprednisolone; Morphine; Pregabalin; Venlafaxine; Buprenorphine; Canagliflozin; Escitalopram; and Hydroxychloroquine   Review of Systems Review of Systems  Respiratory: Positive for cough.   Skin: Positive for rash.     Physical Exam Triage Vital Signs ED Triage Vitals [01/30/19 1253]  Enc Vitals Group     BP      Pulse      Resp      Temp      Temp src      SpO2      Weight 166 lb (75.3 kg)     Height 5\' 3"  (1.6 m)     Head Circumference      Peak Flow      Pain Score 5     Pain Loc      Pain Edu?      Excl. in [place]?    No data found.  Updated Vital Signs BP 107/69 (BP Location: Right Arm)   Pulse 75   Temp 98 F (36.7 C) (Oral)   Resp 18   Ht 5\' 3"  (1.6 m)   Wt 75.3 kg   SpO2 100%   BMI 29.41 kg/m   Visual Acuity Right Eye Distance:   Left Eye Distance:   Bilateral Distance:    Right Eye Near:   Left Eye Near:    Bilateral Near:     Physical Exam Vitals signs and nursing note reviewed.  Constitutional:      General: She is not in acute distress.    Appearance: She is not toxic-appearing or diaphoretic.  HENT:     Right Ear: Tympanic membrane normal.     Left Ear: Tympanic membrane normal.  Cardiovascular:     Rate and Rhythm: Normal rate.  Pulmonary:     Effort: Pulmonary effort is normal. No respiratory distress.     Breath  sounds: No stridor.  Skin:    Findings: Rash (diffuse, pinpoint papulomacular on forehead and forearms) present.  Neurological:     Mental Status: She is alert.      UC Treatments / Results  Labs (all labs ordered are listed, but only abnormal results are displayed) Labs Reviewed - No data to display  EKG None  Radiology Dg Chest 2 View  Result Date: 01/30/2019 CLINICAL DATA:  C/O cough and chills x 4 days. Hx of pneumonia, rt breast ca w/ bilateral  mastectomies. EXAM: CHEST - 2 VIEW COMPARISON:  12/01/2018 and previous FINDINGS: Stable right apically parenchymal opacities since CT 02/12/2018. no new infiltrate or overt edema. Heart size and mediastinal contours are within normal limits. No effusion. Surgical clips in the right axilla. Anterior vertebral endplate spurring at multiple levels in the mid and lower thoracic spine. IMPRESSION: No acute cardiopulmonary disease. Electronically Signed   By: Lucrezia Europe M.D.   On: 01/30/2019 13:19    Procedures Procedures (including critical care time)  Medications Ordered in UC Medications - No data to display  Initial Impression / Assessment and Plan / UC Course  I have reviewed the triage vital signs and the nursing notes.  Pertinent labs & imaging results that were available during my care of the patient were reviewed by me and considered in my medical decision making (see chart for details).      Final Clinical Impressions(s) / UC Diagnoses   Final diagnoses:  Acute viral syndrome    ED Prescriptions    None      1. Chest x-ray results (normal/negative) and diagnosis reviewed with patient 2. Recommend supportive treatment with rest, fluids, otc meds prn 3. Follow-up prn if symptoms worsen or don't improve   Controlled Substance Prescriptions [place] Controlled Substance Registry consulted? Not Applicable   Norval Gable, MD 01/30/19 1339

## 2019-01-30 NOTE — Discharge Instructions (Addendum)
Rest, fluids, over the counter analgesics as needed and antihistamines as needed for itching Monitor symptoms and follow [place] DHHS guidelines

## 2019-02-01 ENCOUNTER — Ambulatory Visit (INDEPENDENT_AMBULATORY_CARE_PROVIDER_SITE_OTHER): Payer: PPO | Admitting: Family Medicine

## 2019-02-01 ENCOUNTER — Encounter: Payer: Self-pay | Admitting: Family Medicine

## 2019-02-01 ENCOUNTER — Other Ambulatory Visit: Payer: Self-pay

## 2019-02-01 DIAGNOSIS — M3500 Sicca syndrome, unspecified: Secondary | ICD-10-CM | POA: Diagnosis not present

## 2019-02-01 DIAGNOSIS — G894 Chronic pain syndrome: Secondary | ICD-10-CM | POA: Diagnosis not present

## 2019-02-01 DIAGNOSIS — E785 Hyperlipidemia, unspecified: Secondary | ICD-10-CM | POA: Diagnosis not present

## 2019-02-01 DIAGNOSIS — R05 Cough: Secondary | ICD-10-CM | POA: Diagnosis not present

## 2019-02-01 DIAGNOSIS — R059 Cough, unspecified: Secondary | ICD-10-CM

## 2019-02-01 DIAGNOSIS — R21 Rash and other nonspecific skin eruption: Secondary | ICD-10-CM

## 2019-02-01 MED ORDER — FAMOTIDINE 20 MG PO TABS: 20.0000 mg | ORAL_TABLET | Freq: Every day | ORAL | 0 refills | Status: DC

## 2019-02-01 MED ORDER — AMOXICILLIN-POT CLAVULANATE 875-125 MG PO TABS: 1.0000 | ORAL_TABLET | Freq: Two times a day (BID) | ORAL | 0 refills | Status: DC

## 2019-02-01 NOTE — Progress Notes (Signed)
Patient ID: Kristina Mccoy, female   DOB: 01/23/1956, 63 y.o.   MRN: [id number]  Virtual Visit via phone Note  This visit type was conducted due to national recommendations for restrictions regarding the COVID-19 pandemic (e.g. social distancing).  This format is felt to be most appropriate for this patient at this time.  All issues noted in this document were discussed and addressed.  No physical exam was performed (except for noted visual exam findings with Video Visits).   I connected with Kristina Mccoy on 02/01/19 at  3:00 PM EDT by telephone and verified that I am speaking with the correct person using two identifiers. Location patient: home Location provider: [place] Persons participating in the virtual visit: patient, provider  I discussed the limitations, risks, security and privacy concerns of performing an evaluation and management service by telephone and the availability of in person appointments. I also discussed with the patient that there may be a patient responsible charge related to this service. The patient expressed understanding and agreed to proceed.  HPI:  Patient and I contacted via phone due to rash being worse and concerns of possible bacterial infection brewing.  Patient went to urgent care on 01/30/2019 and is diagnosed with acute viral syndrome due to diffuse pinpoint papular macular rash on forehead and forearms and a slight cough.  Patient states the rash and the cough in the past have been away a pneumonia has presented for her, did a chest x-ray which was clear.  Patient also reports aches in many joints, which is something that will will commonly occur for her when she is having some sort of infection going on.  Patient has Sjogren's, so anytime she is sick; her symptoms seem more pronounced.  Patient has had no recent travel.  No known contact with anyone being tested for COVID-19 or confirmed with COVID-19.  She has been remaining home is much as possible  throughout the pandemic, has only gone to the grocery store a couple of times in the past few weeks.  She has been diligent about handwashing and wearing a mask when going out of the home.  Denies chest pain, shortness of breath or wheezing.  Denies GI or GU issues.  Cough is slight.  Rash continues to be across forehead and forearms and is seeming more painful today.  ROS: See pertinent positives and negatives per HPI.  Past Medical History:  Diagnosis Date  . Abdominal pain, epigastric   . Abnormal chest CT 09/08/2014  . Allergic urticaria   . Blood clotting disorder ([place])   . Breast cancer ([place]) 5/14   left breast  . Breast cancer ([place]) 02/2013   Dr Laurance Flatten ([facility])  . Diabetes mellitus   . Diverticulosis   . Fibromyalgia   . GERD (gastroesophageal reflux disease)   . Heart murmur   . Hyperlipidemia   . Lymphedema of arm    right  . Myalgia   . Myositis   . Neuromyositis   . Otitis media, chronic   . Pain syndrome, chronic   . Peptic ulcer   . Sicca syndrome ([place])   . Sjogren's disease ([place])   . Sleep apnea   . SOB (shortness of breath)   . Systemic involvement of connective tissue [facility])     Past Surgical History:  Procedure Laterality Date  . BREAST SURGERY Bilateral    mastectomy  . COLONOSCOPY WITH PROPOFOL N/A 04/22/2017   Procedure: COLONOSCOPY WITH PROPOFOL;  Surgeon: Lollie Sails, MD;  Location: [facility] ENDOSCOPY;  Service: Endoscopy;  Laterality: N/A;  . ESOPHAGOGASTRODUODENOSCOPY (EGD) WITH PROPOFOL N/A 04/22/2017   Procedure: ESOPHAGOGASTRODUODENOSCOPY (EGD) WITH PROPOFOL;  Surgeon: Lollie Sails, MD;  Location: [facility] ENDOSCOPY;  Service: Endoscopy;  Laterality: N/A;  . MASTECTOMY Bilateral 03/23/13    Family History  Problem Relation Age of Onset  . Diabetes Mother   . Heart disease Mother   . Hyperlipidemia Mother   . Heart failure Mother   . Diabetes Other   . Heart disease Brother   . Hyperlipidemia Brother   . Colon polyps Brother   .  Stroke Maternal Grandmother   . Breast cancer Maternal Aunt   . Colon cancer Paternal Grandmother   . Ovarian cancer Neg Hx    Social History   Tobacco Use  . Smoking status: Former Smoker    Packs/day: 1.00    Years: 30.00    Pack years: 30.00    Types: Cigarettes    Last attempt to quit: 02/28/2013    Years since quitting: 5.9  . Smokeless tobacco: Never Used  Substance Use Topics  . Alcohol use: No    Alcohol/week: 0.0 standard drinks    Current Outpatient Medications:  .  albuterol (PROVENTIL HFA;VENTOLIN HFA) 108 (90 Base) MCG/ACT inhaler, Inhale 2 puffs into the lungs every 6 (six) hours as needed for wheezing or shortness of breath., Disp: 1 Inhaler, Rfl: 0 .  ALPRAZolam (XANAX) 0.5 MG tablet, TAKE 1 TABLET (0.5 MG TOTAL) BY MOUTH 4 (FOUR) TIMES DAILY AS NEEDED FOR ANXIETY., Disp: 120 tablet, Rfl: 0 .  Cholecalciferol (VITAMIN D3) 1000 units CAPS, Take by mouth., Disp: , Rfl:  .  clobetasol ointment (TEMOVATE) 0.05 %, Apply topically., Disp: , Rfl:  .  Cyanocobalamin 1000 MCG CAPS, Take by mouth., Disp: , Rfl:  .  cyclobenzaprine (FLEXERIL) 10 MG tablet, Take 1 tablet (10 mg total) by mouth 3 (three) times daily as needed for muscle spasms., Disp: 90 tablet, Rfl: 2 .  cycloSPORINE (RESTASIS) 0.05 % ophthalmic emulsion, Place 1 drop into both eyes 4 (four) times daily., Disp: , Rfl:  .  dicyclomine (BENTYL) 10 MG capsule, Take 1 capsule (10 mg total) by mouth 3 (three) times daily as needed for spasms., Disp: 60 capsule, Rfl: 1 .  Emollient (CERAVE) CREA, Apply topically., Disp: , Rfl:  .  fluconazole (DIFLUCAN) 150 MG tablet, Take 1st tablet on day 1 and second tablet on day 4. Repeat this course if needed after 1 week., Disp: 4 tablet, Rfl: 0 .  fluticasone furoate-vilanterol (BREO ELLIPTA) 100-25 MCG/INH AEPB, Inhale 1 puff into the lungs daily., Disp: 60 each, Rfl: 0 .  FREESTYLE LITE test strip, CHECK 6 TIMES DAILY, Disp: 100 each, Rfl: 3 .  glucose blood (ONE TOUCH ULTRA  TEST) test strip, CHECK SIX TIMES DAILY, Disp: 100 each, Rfl: 11 .  hydrOXYzine (ATARAX/VISTARIL) 10 MG tablet, Take by mouth., Disp: , Rfl:  .  hyoscyamine (LEVSIN, ANASPAZ) 0.125 MG tablet, TAKE ONE TABLET BY MOUTH EVERY 4 HOURS AS NEEDED, Disp: 90 tablet, Rfl: 3 .  insulin aspart (NOVOLOG FLEXPEN) 100 UNIT/ML FlexPen, Take up to 2 - 4 units before meals up to 3 times daily, Disp: , Rfl:  .  Insulin Syringe-Needle U-100 (INSULIN SYRINGE .5CC/31GX5/16") 31G X 5/16" 0.5 ML MISC, Use twice daily with injections, Disp: , Rfl:  .  ketoconazole (NIZORAL) 2 % cream, Apply 1 application topically 2 (two) times daily., Disp: , Rfl: 3 .  ketoconazole (NIZORAL) 2 %  shampoo, Apply 1 application topically once a week., Disp: , Rfl: 11 .  Lancets MISC, Use. BAYER CONTOUR LANCETS, Disp: , Rfl:  .  nystatin cream (MYCOSTATIN), Apply 1 application topically as needed., Disp: , Rfl:  .  ondansetron (ZOFRAN) 4 MG tablet, TAKE 1 TABLET BY MOUTH EVERY 8 HOURS AS NEEDED FOR NAUSEA AND VOMITING, Disp: 18 tablet, Rfl: 1 .  ONETOUCH DELICA LANCETS 26S MISC, TWICE DAILY, Disp: 100 each, Rfl: 3 .  pantoprazole (PROTONIX) 40 MG tablet, TAKE 1 TABLET BY MOUTH EVERY DAY, Disp: 90 tablet, Rfl: 1 .  Polyethyl Glycol-Propyl Glycol (SYSTANE OP), Apply to eye., Disp: , Rfl:  .  tamoxifen (NOLVADEX) 20 MG tablet, Take 20 mg by mouth daily., Disp: , Rfl:  .  tiZANidine (ZANAFLEX) 2 MG tablet, TAKE ONE TABLET BY MOUTH EVERY 8 HOURS AS NEEDED FOR HEADACHES, Disp: 60 tablet, Rfl: 0 .  traMADol (ULTRAM) 50 MG tablet, TAKE 1 TABLET (50 MG TOTAL) BY MOUTH EVERY 6 (SIX) HOURS AS NEEDED. FOR PAIN, Disp: 120 tablet, Rfl: 1 .  TRESIBA FLEXTOUCH 100 UNIT/ML SOPN FlexTouch Pen, 20 Units at bedtime., Disp: , Rfl: 5 .  amoxicillin-clavulanate (AUGMENTIN) 875-125 MG tablet, Take 1 tablet by mouth 2 (two) times daily., Disp: 20 tablet, Rfl: 0 .  famotidine (PEPCID) 20 MG tablet, Take 1 tablet (20 mg total) by mouth at bedtime for 14 days., Disp: 14  tablet, Rfl: 0  EXAM:  GENERAL: alert, oriented, sounds well and in no acute distress  LUNGS: Speaking in full sentences. no signs of respiratory distress, breathing rate appears normal, no gasping or wheezing  PSYCH/NEURO: pleasant and cooperative, no obvious depression or anxiety, speech and thought processing grossly intact  ASSESSMENT AND PLAN:  Discussed the following assessment and plan:  Rash and nonspecific skin eruption - Plan: CBC w/Diff, Hepatic function panel, Basic Metabolic Panel (BMET), famotidine (PEPCID) 20 MG tablet  Cough - Plan: amoxicillin-clavulanate (AUGMENTIN) 875-125 MG tablet  Sjogren's syndrome, with unspecified organ involvement (HCC)  Chronic pain disorder  Hyperlipidemia, unspecified hyperlipidemia type - Plan: Hepatic function panel, Lipid panel  Unclear reason for patient's rash and cough.  Could be a viral illnesses diagnosed by urgent care physician or could be development of underlying bacterial infection as this presentation is similar to ways bacterial infections have presented for in the past.  Patient would prefer not to have to take steroids to reduce the rash as they make her feel very jittery and caused her blood sugars to become very elevated.  She will take Zyrtec every day for the next 1 to 2 weeks and will also add Pepcid before bedtime for the next 2 weeks to help create a histamine blockade in hopes of calming down the rash.  Chest x-ray was clear done by urgent care, but due to patient's past medical history we will cover with course of Augmentin to treat bacterial infection.  Patient has upcoming lab work and we will add CBC, BMET to lab testing.  Patient is going to have liver panel and lipid panel rechecked with upcoming lab work due to being on statin for hyperlipidemia treatment.    I discussed the assessment and treatment plan with the patient. The patient was provided an opportunity to ask questions and all were answered. The  patient agreed with the plan and demonstrated an understanding of the instructions.   The patient was advised to call back or seek an in-person evaluation if the symptoms worsen or if the condition fails to  improve as anticipated.  I provided 25 minutes of non-face-to-face time via telephone during this encounter.   Jodelle Green, FNP

## 2019-02-02 ENCOUNTER — Encounter: Payer: Self-pay | Admitting: *Deleted

## 2019-02-02 ENCOUNTER — Encounter: Payer: Self-pay | Admitting: Family Medicine

## 2019-02-03 ENCOUNTER — Ambulatory Visit: Payer: Self-pay | Admitting: Family Medicine

## 2019-02-03 NOTE — Telephone Encounter (Signed)
This could potentially be a viral illness, though I do think at this point it would be beneficial for her to start the augmentin. If she develops a fever (temp 100.4 F or higher) or if she starts to feel worse she needs to be evaluated at urgent care again or in the ED if she develops shortness of breath. Thanks.

## 2019-02-03 NOTE — Telephone Encounter (Signed)
Pt is c/o Big toe pain underneath and on the top of her toe with a a rash, very painful to apply pressure on it Right foot. Foot is swollen. Pt is concern of GOUT. Pt is wondering if she can have a urica acid test done? Her father had GOUT  No injuries per pt.   Pt has a lab appt tomorrow can these be tested at that time?  Pt started the Augmentin today and advised what to do if she has a fever or SOB.

## 2019-02-03 NOTE — Telephone Encounter (Signed)
Noted. The patient should not be coming in for lab work with her respiratory symptoms. We are not seeing any one in the office with respiratory complaints. We can get those rescheduled. I am happy to check a uric acid, though we could also have her do a virtual visit to determine appropriate treatment for her foot as well.

## 2019-02-03 NOTE — Telephone Encounter (Signed)
Ive called pt twice and phone call went to VM left a detailed VM that lab appt has been canceled per PCP due to URI symptoms.   Advised pt to call tomorrow to scheduled for a Virtual Visit with Lauren for her GOUT symptoms  FYI   Pt's appt was at 8:30 AM 02/04/2019

## 2019-02-03 NOTE — Telephone Encounter (Signed)
Sent to PCP to advise 

## 2019-02-03 NOTE — Telephone Encounter (Signed)
Pt. Reports she was seen in UC Saturday and told she had a viral syndrome. Reports she spoke to Philis Nettle and she told her if she was no better to start Amoxicillin today. Pt. Developed increased joint pain - especially her right foot, which is swollen, and her left knee. Temp. This morning is 99.7. Still has hives, headache,chills. States she will start Amoxicillin, but wants to know what her PCP thinks. Please advise pt.   Answer Assessment - Initial Assessment Questions 1. DESCRIPTION: "Describe how you are feeling."     Weak 2. SEVERITY: "How bad is it?"  "Can you stand and walk?"   - MILD - Feels weak or tired, but does not interfere with work, school or normal activities   - [place] to stand and walk; weakness interferes with work, school, or normal activities   - SEVERE - Unable to stand or walk     Moderate 3. ONSET:  "When did the weakness begin?"     Saturday 4. CAUSE: "What do you think is causing the weakness?"     Unsure 5. MEDICINES: "Have you recently started a new medicine or had a change in the amount of a medicine?"     No 6. OTHER SYMPTOMS: "Do you have any other symptoms?" (e.g., chest pain, fever, cough, SOB, vomiting, diarrhea, bleeding, other areas of pain)     99.7, joint pain, headache, hives - arms thighs, feet, trunk, chills 7. PREGNANCY: "Is there any chance you are pregnant?" "When was your last menstrual period?"     No  Protocols used: WEAKNESS (GENERALIZED) AND FATIGUE-A-AH

## 2019-02-04 ENCOUNTER — Encounter: Payer: Self-pay | Admitting: Family Medicine

## 2019-02-04 ENCOUNTER — Other Ambulatory Visit: Payer: Self-pay

## 2019-02-04 ENCOUNTER — Ambulatory Visit (INDEPENDENT_AMBULATORY_CARE_PROVIDER_SITE_OTHER): Payer: PPO | Admitting: Family Medicine

## 2019-02-04 VITALS — Temp 95.9°F

## 2019-02-04 DIAGNOSIS — M3500 Sicca syndrome, unspecified: Secondary | ICD-10-CM

## 2019-02-04 DIAGNOSIS — R5383 Other fatigue: Secondary | ICD-10-CM

## 2019-02-04 DIAGNOSIS — Z20822 Contact with and (suspected) exposure to covid-19: Secondary | ICD-10-CM

## 2019-02-04 DIAGNOSIS — R6883 Chills (without fever): Secondary | ICD-10-CM | POA: Diagnosis not present

## 2019-02-04 DIAGNOSIS — B349 Viral infection, unspecified: Secondary | ICD-10-CM

## 2019-02-04 DIAGNOSIS — R05 Cough: Secondary | ICD-10-CM | POA: Diagnosis not present

## 2019-02-04 DIAGNOSIS — R6889 Other general symptoms and signs: Secondary | ICD-10-CM | POA: Diagnosis not present

## 2019-02-04 DIAGNOSIS — R21 Rash and other nonspecific skin eruption: Secondary | ICD-10-CM | POA: Diagnosis not present

## 2019-02-04 DIAGNOSIS — R52 Pain, unspecified: Secondary | ICD-10-CM | POA: Diagnosis not present

## 2019-02-04 DIAGNOSIS — R059 Cough, unspecified: Secondary | ICD-10-CM

## 2019-02-04 NOTE — Progress Notes (Signed)
Patient ID: Kristina Mccoy, female   DOB: 09-06-1956, 63 y.o.   MRN: [id number]    Virtual Visit via phone Note  This visit type was conducted due to national recommendations for restrictions regarding the COVID-19 pandemic (e.g. social distancing).  This format is felt to be most appropriate for this patient at this time.  All issues noted in this document were discussed and addressed.  No physical exam was performed (except for noted visual exam findings with Video Visits).   I connected with Kristina Mccoy today at  9:20 AM EDT by telephone and verified that I am speaking with the correct person using two identifiers. Location patient: home Location provider: [place] Persons participating in the virtual visit: patient, provider  I discussed the limitations, risks, security and privacy concerns of performing an evaluation and management service by telephone and the availability of in person appointments. I also discussed with the patient that there may be a patient responsible charge related to this service. The patient expressed understanding and agreed to proceed.   HPI: Patient and I connected via phone today due to continuing to not feel well.  States rash that originally was on her forehead and forearms now is on her chest shoulder and all over her arms.  Also has body aches and joint pains all over.  Not sure if it was her gout flaring up or just generalized pain due to her Sjogren's which is been exacerbated by being sick.  Feels very chilled today.  Took her temperature and states the thermometer read 95.9 F.  Thermometer is a oral digital thermometer.  Took her temperature yesterday and it was 99.7.  Does have slight cough at night at times, but is not bringing up thick phlegm.  She is taking Augmentin currently to cover any sort of bacterial infection that could possibly be underlying.  Patient states she just feels generally weak and very tired and pain all over from the rash and  generalized joint pain is very unpleasant.  Patient wondering if she should just go to ER to be tested for coronavirus.  Denies inability to catch breath or wheezing.  Denies chest pain.  Denies vomiting or diarrhea.  States her appetite is less, but she is able to keep up fluid intake and eat small amounts of food.   ROS: See pertinent positives and negatives per HPI.  Past Medical History:  Diagnosis Date   Abdominal pain, epigastric    Abnormal chest CT 09/08/2014   Allergic urticaria    Blood clotting disorder (HCC)    Breast cancer ([place]) 5/14   left breast   Breast cancer ([place]) 02/2013   Dr Laurance Flatten ([facility])   Diabetes mellitus    Diverticulosis    Fibromyalgia    GERD (gastroesophageal reflux disease)    Heart murmur    Hyperlipidemia    Lymphedema of arm    right   Myalgia    Myositis    Neuromyositis    Otitis media, chronic    Pain syndrome, chronic    Peptic ulcer    Sicca syndrome (HCC)    Sjogren's disease (HCC)    Sleep apnea    SOB (shortness of breath)    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[place])     Past Surgical History:  Procedure Laterality Date   BREAST SURGERY Bilateral    mastectomy   COLONOSCOPY WITH PROPOFOL N/A 04/22/2017   Procedure: COLONOSCOPY WITH PROPOFOL;  Surgeon: Lollie Sails, MD;  Location:  [facility];  Service: Endoscopy;  Laterality: N/A;   ESOPHAGOGASTRODUODENOSCOPY (EGD) WITH PROPOFOL N/A 04/22/2017   Procedure: ESOPHAGOGASTRODUODENOSCOPY (EGD) WITH PROPOFOL;  Surgeon: Lollie Sails, MD;  Location: [facility];  Service: Endoscopy;  Laterality: N/A;   MASTECTOMY Bilateral 03/23/13    Family History  Problem Relation Age of Onset   Diabetes Mother    Heart disease Mother    Hyperlipidemia Mother    Heart failure Mother    Diabetes Other    Heart disease Brother    Hyperlipidemia Brother    Colon polyps Brother    Stroke Maternal Grandmother    Breast cancer  Maternal Aunt    Colon cancer Paternal Grandmother    Ovarian cancer Neg Hx    Social History   Tobacco Use   Smoking status: Former Smoker    Packs/day: 1.00    Years: 30.00    Pack years: 30.00    Types: Cigarettes    Last attempt to quit: 02/28/2013    Years since quitting: 5.9   Smokeless tobacco: Never Used  Substance Use Topics   Alcohol use: No    Alcohol/week: 0.0 standard drinks    Current Outpatient Medications:    albuterol (PROVENTIL HFA;VENTOLIN HFA) 108 (90 Base) MCG/ACT inhaler, Inhale 2 puffs into the lungs every 6 (six) hours as needed for wheezing or shortness of breath., Disp: 1 Inhaler, Rfl: 0   ALPRAZolam (XANAX) 0.5 MG tablet, TAKE 1 TABLET (0.5 MG TOTAL) BY MOUTH 4 (FOUR) TIMES DAILY AS NEEDED FOR ANXIETY., Disp: 120 tablet, Rfl: 0   amoxicillin-clavulanate (AUGMENTIN) 875-125 MG tablet, Take 1 tablet by mouth 2 (two) times daily., Disp: 20 tablet, Rfl: 0   Cholecalciferol (VITAMIN D3) 1000 units CAPS, Take by mouth., Disp: , Rfl:    clobetasol ointment (TEMOVATE) 0.05 %, Apply topically., Disp: , Rfl:    Cyanocobalamin 1000 MCG CAPS, Take by mouth., Disp: , Rfl:    cyclobenzaprine (FLEXERIL) 10 MG tablet, Take 1 tablet (10 mg total) by mouth 3 (three) times daily as needed for muscle spasms., Disp: 90 tablet, Rfl: 2   cycloSPORINE (RESTASIS) 0.05 % ophthalmic emulsion, Place 1 drop into both eyes 4 (four) times daily., Disp: , Rfl:    dicyclomine (BENTYL) 10 MG capsule, Take 1 capsule (10 mg total) by mouth 3 (three) times daily as needed for spasms., Disp: 60 capsule, Rfl: 1   Emollient (CERAVE) CREA, Apply topically., Disp: , Rfl:    famotidine (PEPCID) 20 MG tablet, Take 1 tablet (20 mg total) by mouth at bedtime for 14 days., Disp: 14 tablet, Rfl: 0   fluconazole (DIFLUCAN) 150 MG tablet, Take 1st tablet on day 1 and second tablet on day 4. Repeat this course if needed after 1 week., Disp: 4 tablet, Rfl: 0   fluticasone furoate-vilanterol  (BREO ELLIPTA) 100-25 MCG/INH AEPB, Inhale 1 puff into the lungs daily., Disp: 60 each, Rfl: 0   FREESTYLE LITE test strip, CHECK 6 TIMES DAILY, Disp: 100 each, Rfl: 3   glucose blood (ONE TOUCH ULTRA TEST) test strip, CHECK SIX TIMES DAILY, Disp: 100 each, Rfl: 11   hydrOXYzine (ATARAX/VISTARIL) 10 MG tablet, Take by mouth., Disp: , Rfl:    hyoscyamine (LEVSIN, ANASPAZ) 0.125 MG tablet, TAKE ONE TABLET BY MOUTH EVERY 4 HOURS AS NEEDED, Disp: 90 tablet, Rfl: 3   insulin aspart (NOVOLOG FLEXPEN) 100 UNIT/ML FlexPen, Take up to 2 - 4 units before meals up to 3 times daily, Disp: , Rfl:    Insulin  Syringe-Needle U-100 (INSULIN SYRINGE .5CC/31GX5/16") 31G X 5/16" 0.5 ML MISC, Use twice daily with injections, Disp: , Rfl:    ketoconazole (NIZORAL) 2 % cream, Apply 1 application topically 2 (two) times daily., Disp: , Rfl: 3   ketoconazole (NIZORAL) 2 % shampoo, Apply 1 application topically once a week., Disp: , Rfl: 11   Lancets MISC, Use. BAYER CONTOUR LANCETS, Disp: , Rfl:    nystatin cream (MYCOSTATIN), Apply 1 application topically as needed., Disp: , Rfl:    ondansetron (ZOFRAN) 4 MG tablet, TAKE 1 TABLET BY MOUTH EVERY 8 HOURS AS NEEDED FOR NAUSEA AND VOMITING, Disp: 18 tablet, Rfl: 1   ONETOUCH DELICA LANCETS 80D MISC, TWICE DAILY, Disp: 100 each, Rfl: 3   pantoprazole (PROTONIX) 40 MG tablet, TAKE 1 TABLET BY MOUTH EVERY DAY, Disp: 90 tablet, Rfl: 1   Polyethyl Glycol-Propyl Glycol (SYSTANE OP), Apply to eye., Disp: , Rfl:    tamoxifen (NOLVADEX) 20 MG tablet, Take 20 mg by mouth daily., Disp: , Rfl:    tiZANidine (ZANAFLEX) 2 MG tablet, TAKE ONE TABLET BY MOUTH EVERY 8 HOURS AS NEEDED FOR HEADACHES, Disp: 60 tablet, Rfl: 0   traMADol (ULTRAM) 50 MG tablet, TAKE 1 TABLET (50 MG TOTAL) BY MOUTH EVERY 6 (SIX) HOURS AS NEEDED. FOR PAIN, Disp: 120 tablet, Rfl: 1   TRESIBA FLEXTOUCH 100 UNIT/ML SOPN FlexTouch Pen, 20 Units at bedtime., Disp: , Rfl: 5  EXAM:  GENERAL: alert,  oriented, appears well and in no acute distress  HEENT: atraumatic, conjunttiva clear, no obvious abnormalities on inspection of external nose and ears  NECK: normal movements of the head and neck  LUNGS: on inspection no signs of respiratory distress, breathing rate appears normal, no obvious gross SOB, gasping or wheezing  CV: no obvious cyanosis  MS: moves all visible extremities without noticeable abnormality  PSYCH/NEURO: pleasant and cooperative, no obvious depression or anxiety, speech and thought processing grossly intact  ASSESSMENT AND PLAN:  Discussed the following assessment and plan:  Suspected Covid-19 Virus Infection - Plan: MYCHART COVID-19 HOME MONITORING PROGRAM, Temperature monitoring  Sjogren's syndrome, with unspecified organ involvement (HCC)  Generalized body aches  Chills  Cough  Rash and nonspecific skin eruption  Fatigue, unspecified type  Viral illness  Long discussion with patient about what she should do at this point due to her symptoms not seeming to become better.  She has not been feeling well for exactly 7 days as of today.  Patient states she really would like to know exactly what is going on and wonders if she should go to the ER for coronavirus testing.  Patient advised that if she is not have any difficulty breathing, she is able to eat and drink and feels well enough to care for herself at home I would prefer she remain home monitor herself for any worsening.  The risk of going to the ER just to be tested for coronavirus does not outweigh the benefit of knowing if she has it.  Advised patient there is no specific medication for the coronavirus, so unless she needs a higher level of medical care including telemetry monitoring, IV fluids, oxygen via nasal cannula, mask or ventilator -- going to the ER would only open her up for more risk of exposure to other illnesses.  At this time I would recommend she remain home for at minimum another 7 days  and monitor self using her self home monitoring tool by checking her temperature and letting us know if any of  her symptoms change or worsen.  Patient has multiple medication allergies and does not do well with various pain medicines.  Advised to continue using tramadol if needed for moderate pain, Tylenol for mild pain.  Advised to keep up good fluid intake, eat a bland diet including crackers, rice, soup, broth, Jell-O and slowly advance diet as tolerated. She has albuterol inhaler to use as needed. Unclear reason for her rash, but could be due to viral illness; patient cannot tolerate oral steroids and due to possible COVID, we will avoid steroids. Advised that if her symptoms do worsen in any way to call office and or call the on-call service to let us know and for further instruction.  Advised that if any point she is feeling unwell enough and believes she does need to go to the ER, I would advise her not to hesitate and go to the ER if she feels she is worsening and needs emergency department care.   I discussed the assessment and treatment plan with the patient. The patient was provided an opportunity to ask questions and all were answered. The patient agreed with the plan and demonstrated an understanding of the instructions.   The patient was advised to call back or seek an in-person evaluation if the symptoms worsen or if the condition fails to improve as anticipated.  I provided 30 minutes of non-face-to-face time via phone during this encounter.  Jodelle Green, FNP

## 2019-02-04 NOTE — Telephone Encounter (Signed)
Called Pt and scheduled her for a Phone visit today at 9:20am

## 2019-02-04 NOTE — Telephone Encounter (Signed)
Please call to see if patient will do another phone visit to discuss her symptoms  Thanks  LG

## 2019-02-05 ENCOUNTER — Telehealth: Payer: Self-pay

## 2019-02-05 MED ORDER — PREDNISONE 5 MG PO TABS: ORAL_TABLET | ORAL | 0 refills | Status: DC

## 2019-02-05 NOTE — Telephone Encounter (Signed)
Copied from [place]. Topic: General - Inquiry >> Feb 05, 2019 11:44 AM Virl Axe D wrote: Reason for CRM: Pt would like to have a callback from [facility] today regarding followup from their appointment and discussion about medication for her symptoms on yesterday. She would like to have those rx's sent to her pharmacy but would like to speak with Lauren first. Please advise. CB#[phone]

## 2019-02-05 NOTE — Addendum Note (Signed)
Addended by: Philis Nettle on: 02/05/2019 12:26 PM   Modules accepted: Orders

## 2019-02-05 NOTE — Telephone Encounter (Signed)
Spoke with patient, willing to do steroids

## 2019-02-11 ENCOUNTER — Encounter: Payer: Self-pay | Admitting: Family Medicine

## 2019-02-12 ENCOUNTER — Telehealth: Payer: Self-pay

## 2019-02-12 MED ORDER — INDOMETHACIN 50 MG PO CAPS: 50.0000 mg | ORAL_CAPSULE | Freq: Three times a day (TID) | ORAL | 0 refills | Status: DC

## 2019-02-12 NOTE — Telephone Encounter (Signed)
Copied from [place] [id number]. Topic: General - Other >> Feb 12, 2019  1:50 PM Keene Breath wrote: Reason for CRM: Patient called to request that Philis Nettle give her a call regarding some on-going issues she has been having.  Patient stated that the doctor would know what she was talking about.  CB# [phone]

## 2019-02-12 NOTE — Telephone Encounter (Signed)
Called and spoke with patient.  She notes the rash is gone today.  She notes no longer having any cough or congestion and those resolved about 7 days ago.  She notes her main complaint is pain in her left foot that is excruciating.  She is unable to walk on it.  She notes mild swelling.  She notes mild erythema specifically near her great toe.  She has had no fevers and her temperature was 98.1 F.  She notes no history of gout though she wonders if she has gout given the discomfort.  No injury.  The area does hurt with minimal palpation.  I discussed with the patient that the location of her symptoms and description of symptoms do seem consistent with gout.  Discussed that we could consider treatment with prednisone though she notes her blood glucose elevates significantly when she takes prednisone.  I discussed that we could trial an anti-inflammatory called indomethacin.  Recent renal function was reviewed.  Patient does not have any cardiac disease.  Advised to take this with food.  I will have somebody contact her on Monday to follow-up and see how she is feeling.  If not improving she will need to be seen in the office.  If she worsens or has any spreading redness or develops fever she will be evaluated at urgent care over the weekend.

## 2019-02-12 NOTE — Telephone Encounter (Signed)
Patient called to clarify that it is her left foot that is bothering her. She suspects maybe gout. She wanted to make sure that Dr. Caryl Bis received message and is requesting a call back when office reopens on Monday.

## 2019-02-15 NOTE — Telephone Encounter (Signed)
Called and spoke with patient to check on foot rash and pain, pt states its not better, she states she want to see Dr. Caryl Bis, I informed Dr. Caryl Bis and he agreed to see her in person tomorrow morning, the pt scheduled a in person visit with the PCP for Tuesday @ 10 am, I informed her of covid screening and to wear a mack.  Pt understood.  Nina,cma

## 2019-02-16 ENCOUNTER — Encounter: Payer: Self-pay | Admitting: Family Medicine

## 2019-02-16 ENCOUNTER — Other Ambulatory Visit: Payer: Self-pay

## 2019-02-16 ENCOUNTER — Ambulatory Visit (INDEPENDENT_AMBULATORY_CARE_PROVIDER_SITE_OTHER): Payer: PPO | Admitting: Family Medicine

## 2019-02-16 ENCOUNTER — Telehealth: Payer: Self-pay | Admitting: Family Medicine

## 2019-02-16 DIAGNOSIS — R6889 Other general symptoms and signs: Secondary | ICD-10-CM

## 2019-02-16 DIAGNOSIS — Z20822 Contact with and (suspected) exposure to covid-19: Secondary | ICD-10-CM

## 2019-02-16 DIAGNOSIS — M79673 Pain in unspecified foot: Secondary | ICD-10-CM | POA: Diagnosis not present

## 2019-02-16 DIAGNOSIS — R21 Rash and other nonspecific skin eruption: Secondary | ICD-10-CM

## 2019-02-16 NOTE — Assessment & Plan Note (Signed)
This potentially could be related to COVID-19 or could be related to some other underlying issue.  If her COVID-19 testing is negative we will need to consider evaluation with dermatology.

## 2019-02-16 NOTE — Assessment & Plan Note (Signed)
Potentially could be related to her current illness or could have been gout.  Seems to have responded to indomethacin.  Will consider checking a uric acid at her next visit.

## 2019-02-16 NOTE — Assessment & Plan Note (Signed)
Patient has had persistent symptoms now with fevers and cough.  I do suspect that she could have COVID-19.  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  We will try to arrange for her to have testing completed today.  Phone message sent to the [facility] testing pool.  I discussed strict quarantine precautions with the patient that she will need to keep at least until her test returns.  I discussed that if she is to develop significant worsening symptoms or increasing difficulty breathing she needs to go to the emergency department for evaluation.  She verbalized understanding.

## 2019-02-16 NOTE — Progress Notes (Signed)
Symptoms have been going on since April. Pt stated that she has had a fever around lastnight between 102 F - 101F. Pt stated that her temp was 107 F after taking tylenol this morning.  Pt is c/o a rash all over her arms, chest, feet, and legs. Rash seems worse when her she has a fever, joint pain, difficulty walking, swelling in hands and feet, headaches and sore throat. Over all feeling weak, some shortness of breath but not too bad nothing out of there ordinary per pt.

## 2019-02-16 NOTE — Progress Notes (Signed)
Virtual Visit via telephone Note  This visit type was conducted due to national recommendations for restrictions regarding the COVID-19 pandemic (e.g. social distancing).  This format is felt to be most appropriate for this patient at this time.  All issues noted in this document were discussed and addressed.  No physical exam was performed (except for noted visual exam findings with Video Visits).   I connected with Kristina Mccoy today at 10:00 AM EDT by telephone and verified that I am speaking with the correct person using two identifiers. Location patient: home Location provider: work  Persons participating in the virtual visit: patient, provider  I discussed the limitations, risks, security and privacy concerns of performing an evaluation and management service by telephone and the availability of in person appointments. I also discussed with the patient that there may be a patient responsible charge related to this service. The patient expressed understanding and agreed to proceed.  Interactive audio and video telecommunications were attempted between this provider and patient, however failed, due to patient having technical difficulties OR patient did not have access to video capability.  We continued and completed visit with audio only.   Reason for visit: Follow-up -fever, rash, cough, foot pain  HPI: Patient is evaluated today for a variety of symptoms.  She has had rash since the end of April.  She described the rash as initially appearing like a sunburn on her forehead.  She went to urgent care and was noted to have a diffuse maculopapular rash at that time.  She notes at times the rash still looks like a sunburn and other times changes to a rash that is similar to what was seen in urgent care.  She was having significant foot pain previously though that has improved with indomethacin.  She notes she started to have a fever last night with chills and shaking for hours.  T-max was  102 F.  She has had scattered joint pains as well as headaches and sore throat for several weeks.  She does note some mild dyspnea though this is at her typical baseline given her restrictive lung disease.  She does note having a cough yesterday.  She has had some increased thirst and dry mouth though she notes this is not very different than her baseline.  She has been quarantining since she went to urgent care on 5/2.   ROS: See pertinent positives and negatives per HPI.  Past Medical History:  Diagnosis Date   Abdominal pain, epigastric    Abnormal chest CT 09/08/2014   Allergic urticaria    Blood clotting disorder (HCC)    Breast cancer ([place]) 5/14   left breast   Breast cancer ([place]) 02/2013   Dr Laurance Flatten ([facility])   Diabetes mellitus    Diverticulosis    Fibromyalgia    GERD (gastroesophageal reflux disease)    Heart murmur    Hyperlipidemia    Lymphedema of arm    right   Myalgia    Myositis    Neuromyositis    Otitis media, chronic    Pain syndrome, chronic    Peptic ulcer    Sicca syndrome (HCC)    Sjogren's disease (HCC)    Sleep apnea    SOB (shortness of breath)    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[place])     Past Surgical History:  Procedure Laterality Date   BREAST SURGERY Bilateral    mastectomy   COLONOSCOPY WITH PROPOFOL N/A 04/22/2017   Procedure: COLONOSCOPY  WITH PROPOFOL;  Surgeon: Lollie Sails, MD;  Location: [facility] ENDOSCOPY;  Service: Endoscopy;  Laterality: N/A;   ESOPHAGOGASTRODUODENOSCOPY (EGD) WITH PROPOFOL N/A 04/22/2017   Procedure: ESOPHAGOGASTRODUODENOSCOPY (EGD) WITH PROPOFOL;  Surgeon: Lollie Sails, MD;  Location: [facility] ENDOSCOPY;  Service: Endoscopy;  Laterality: N/A;   MASTECTOMY Bilateral 03/23/13    Family History  Problem Relation Age of Onset   Diabetes Mother    Heart disease Mother    Hyperlipidemia Mother    Heart failure Mother    Diabetes Other    Heart disease Brother     Hyperlipidemia Brother    Colon polyps Brother    Stroke Maternal Grandmother    Breast cancer Maternal Aunt    Colon cancer Paternal Grandmother    Ovarian cancer Neg Hx     SOCIAL HX: Former smoker   Current Outpatient Medications:    albuterol (PROVENTIL HFA;VENTOLIN HFA) 108 (90 Base) MCG/ACT inhaler, Inhale 2 puffs into the lungs every 6 (six) hours as needed for wheezing or shortness of breath., Disp: 1 Inhaler, Rfl: 0   ALPRAZolam (XANAX) 0.5 MG tablet, TAKE 1 TABLET (0.5 MG TOTAL) BY MOUTH 4 (FOUR) TIMES DAILY AS NEEDED FOR ANXIETY., Disp: 120 tablet, Rfl: 0   Cholecalciferol (VITAMIN D3) 1000 units CAPS, Take by mouth., Disp: , Rfl:    clobetasol ointment (TEMOVATE) 0.05 %, Apply topically., Disp: , Rfl:    Cyanocobalamin 1000 MCG CAPS, Take by mouth., Disp: , Rfl:    cyclobenzaprine (FLEXERIL) 10 MG tablet, Take 1 tablet (10 mg total) by mouth 3 (three) times daily as needed for muscle spasms., Disp: 90 tablet, Rfl: 2   cycloSPORINE (RESTASIS) 0.05 % ophthalmic emulsion, Place 1 drop into both eyes 4 (four) times daily., Disp: , Rfl:    dicyclomine (BENTYL) 10 MG capsule, Take 1 capsule (10 mg total) by mouth 3 (three) times daily as needed for spasms., Disp: 60 capsule, Rfl: 1   Emollient (CERAVE) CREA, Apply topically., Disp: , Rfl:    fluticasone furoate-vilanterol (BREO ELLIPTA) 100-25 MCG/INH AEPB, Inhale 1 puff into the lungs daily., Disp: 60 each, Rfl: 0   FREESTYLE LITE test strip, CHECK 6 TIMES DAILY, Disp: 100 each, Rfl: 3   glucose blood (ONE TOUCH ULTRA TEST) test strip, CHECK SIX TIMES DAILY, Disp: 100 each, Rfl: 11   hydrOXYzine (ATARAX/VISTARIL) 10 MG tablet, Take by mouth., Disp: , Rfl:    hyoscyamine (LEVSIN, ANASPAZ) 0.125 MG tablet, TAKE ONE TABLET BY MOUTH EVERY 4 HOURS AS NEEDED, Disp: 90 tablet, Rfl: 3   indomethacin (INDOCIN) 50 MG capsule, Take 1 capsule (50 mg total) by mouth 3 (three) times daily with meals., Disp: 15 capsule, Rfl:  0   insulin aspart (NOVOLOG FLEXPEN) 100 UNIT/ML FlexPen, Take up to 2 - 4 units before meals up to 3 times daily, Disp: , Rfl:    Insulin Syringe-Needle U-100 (INSULIN SYRINGE .5CC/31GX5/16") 31G X 5/16" 0.5 ML MISC, Use twice daily with injections, Disp: , Rfl:    ketoconazole (NIZORAL) 2 % cream, Apply 1 application topically 2 (two) times daily., Disp: , Rfl: 3   ketoconazole (NIZORAL) 2 % shampoo, Apply 1 application topically once a week., Disp: , Rfl: 11   Lancets MISC, Use. BAYER CONTOUR LANCETS, Disp: , Rfl:    nystatin cream (MYCOSTATIN), Apply 1 application topically as needed., Disp: , Rfl:    ondansetron (ZOFRAN) 4 MG tablet, TAKE 1 TABLET BY MOUTH EVERY 8 HOURS AS NEEDED FOR NAUSEA AND VOMITING, Disp: 18 tablet,  Rfl: 1   ONETOUCH DELICA LANCETS 35T MISC, TWICE DAILY, Disp: 100 each, Rfl: 3   pantoprazole (PROTONIX) 40 MG tablet, TAKE 1 TABLET BY MOUTH EVERY DAY, Disp: 90 tablet, Rfl: 1   Polyethyl Glycol-Propyl Glycol (SYSTANE OP), Apply to eye., Disp: , Rfl:    predniSONE (DELTASONE) 5 MG tablet, Take 5 tablets on day 1, take 4 tablets on day 2, take 3 tablets on day 3, take 2 tablets on day 4 and take 1 tablet on day 5., Disp: 16 tablet, Rfl: 0   tamoxifen (NOLVADEX) 20 MG tablet, Take 20 mg by mouth daily., Disp: , Rfl:    tiZANidine (ZANAFLEX) 2 MG tablet, TAKE ONE TABLET BY MOUTH EVERY 8 HOURS AS NEEDED FOR HEADACHES, Disp: 60 tablet, Rfl: 0   traMADol (ULTRAM) 50 MG tablet, TAKE 1 TABLET (50 MG TOTAL) BY MOUTH EVERY 6 (SIX) HOURS AS NEEDED. FOR PAIN, Disp: 120 tablet, Rfl: 1   TRESIBA FLEXTOUCH 100 UNIT/ML SOPN FlexTouch Pen, 20 Units at bedtime., Disp: , Rfl: 5   famotidine (PEPCID) 20 MG tablet, Take 1 tablet (20 mg total) by mouth at bedtime for 14 days., Disp: 14 tablet, Rfl: 0  EXAM: This was a telehealth telephone visit and thus no physical exam was completed.  ASSESSMENT AND PLAN:  Discussed the following assessment and plan:  Rash  Suspected  Covid-19 Virus Infection  Pain of foot, unspecified laterality  Suspected Covid-19 Virus Infection Patient has had persistent symptoms now with fevers and cough.  I do suspect that she could have COVID-19.  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  We will try to arrange for her to have testing completed today.  Phone message sent to the [facility] testing pool.  I discussed strict quarantine precautions with the patient that she will need to keep at least until her test returns.  I discussed that if she is to develop significant worsening symptoms or increasing difficulty breathing she needs to go to the emergency department for evaluation.  She verbalized understanding.  Foot pain Potentially could be related to her current illness or could have been gout.  Seems to have responded to indomethacin.  Will consider checking a uric acid at her next visit.  Rash This potentially could be related to COVID-19 or could be related to some other underlying issue.  If her COVID-19 testing is negative we will need to consider evaluation with dermatology.    I discussed the assessment and treatment plan with the patient. The patient was provided an opportunity to ask questions and all were answered. The patient agreed with the plan and demonstrated an understanding of the instructions.   The patient was advised to call back or seek an in-person evaluation if the symptoms worsen or if the condition fails to improve as anticipated.  I provided 19 minutes of non-face-to-face time during this encounter.   [name], MD

## 2019-02-16 NOTE — Telephone Encounter (Signed)
Contacted pt to schedule COVID testing per Dr Caryl Bis; pt offered and accepted appointment at [facility] site 02/16/2019 at 1300; pt given address, directions, and instructions that she and all occupants of her car should wear a mask; she verbalized understanding; orders placed per protocol; she sees Dr Caryl Bis, [facility]; will route to provider for notification.

## 2019-02-16 NOTE — Telephone Encounter (Signed)
I am sending this to request COVID19 testing for this patient. 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 She is currently having fever up to 102 F, cough, mild dyspnea, sore throat, and headache. Also with rash. Please let me know if you need any additional information.    [employer] number [id number]  MRN [id number]

## 2019-02-18 LAB — NOVEL CORONAVIRUS, NAA: SARS-CoV-2, NAA: NOT DETECTED

## 2019-02-19 ENCOUNTER — Encounter: Payer: Self-pay | Admitting: Family Medicine

## 2019-02-19 ENCOUNTER — Encounter: Payer: Self-pay | Admitting: Internal Medicine

## 2019-02-19 ENCOUNTER — Other Ambulatory Visit: Payer: Self-pay

## 2019-02-19 ENCOUNTER — Telehealth: Payer: Self-pay | Admitting: Family

## 2019-02-19 ENCOUNTER — Ambulatory Visit (INDEPENDENT_AMBULATORY_CARE_PROVIDER_SITE_OTHER): Payer: PPO | Admitting: Internal Medicine

## 2019-02-19 DIAGNOSIS — R509 Fever, unspecified: Secondary | ICD-10-CM | POA: Diagnosis not present

## 2019-02-19 DIAGNOSIS — Z113 Encounter for screening for infections with a predominantly sexual mode of transmission: Secondary | ICD-10-CM

## 2019-02-19 DIAGNOSIS — R21 Rash and other nonspecific skin eruption: Secondary | ICD-10-CM

## 2019-02-19 DIAGNOSIS — Z853 Personal history of malignant neoplasm of breast: Secondary | ICD-10-CM

## 2019-02-19 DIAGNOSIS — M2550 Pain in unspecified joint: Secondary | ICD-10-CM | POA: Diagnosis not present

## 2019-02-19 NOTE — Progress Notes (Signed)
This patient has an on-going rash on her leg. You can see images via her last mychart message. She wanted really Dr. Caryl Bis to look at this since she has been unable to do virtual. I stated that he was out of the office & Joycelyn Schmid who was covering had no openings today.   I am unsure as to what can even be done. She has tried steroids, cortisone's & been to ER with dx of viral syndrome. She has also been tested for COVID-19 & awaiting results.

## 2019-02-19 NOTE — Telephone Encounter (Signed)
Call pt re rash, fever  Please triage  Offer appt or send to urgent care.

## 2019-02-19 NOTE — Telephone Encounter (Signed)
noted 

## 2019-02-19 NOTE — Telephone Encounter (Signed)
Call pt  I know she is frustrating Without an appt, I unfortunately cannot put rash into context and offer diagnosis.   Please ensure NOT worse and NO new symptoms.   She can either been at urgent care today if worse or new symptoms or may Offer appt with PCP on monday

## 2019-02-19 NOTE — Telephone Encounter (Signed)
Sent to PCP ?

## 2019-02-19 NOTE — Progress Notes (Signed)
telephone Note  I connected with Shaliah Wann   on 02/19/19 at  2:00 PM EDT by telephone and verified that I am speaking with the correct person using two identifiers.  Location patient: home Location provider:work Persons participating in the virtual visit: patient, provider  I discussed the limitations of evaluation and management by telemedicine and the availability of in person appointments. The patient expressed understanding and agreed to proceed.   HPI: 1. Rash and fever ongoing since 01/27/2019. She tried benadryl at this time which did not help.  She went to urgent care 01/30/2019 and was diagnosed with viral syndrome and has been seen by 2 other providers and given topical steroids and prednisone w/o relief since.  Rash started out like a sunburn on her forehead and itchy but has now since not been itching been more painful and spread to her face, arms, chest, feet and legs and been on her left knee/thigh area.  Rash has been associated with h/a (Zanaflex did help with h/as of note), sore throat, diffuse joint pain and swelling especially in arms, hands and left knee which left knee pain is now resolved with taking Indocin she is no longer taking this.  Of note bone scan 01/12/2019 with h/o bone cancer with degenerative changes but negative for metastatic disease. She also reports she has had diffuse recurrent sinusitis seeing Dr. Kathyrn Sheriff and has been on Augmentin, Clindamycin and levaquin w/o help and she has appt with ENT 02/25/19 and also for a CT scan of her sinuses. Prednisone did not help with the rash either given 02/01/2019.  She reports she stopped Tamoxifen weeks ago due to potential side effects and she f/u with [facility] H/O. She is no longer taking Atarax as this did not help with itching. She is not doing any new medications or cosmetic products and uses unscented products.   Today 02/19/19 rash is more severe on arms, legs and body and more red and started to be worse last night on chest,  neck.   Fever for the last 5 days temp 99 to 102 and with fever rash redness intensity increases. With fever has associated cold and shaking chills she reports COVID 19 testing 02/16/2019 negative   Of note she has also been having elevated lfts with h/o PBC, fatty liver MRI with MRCP 10/21/18 negative.   ROS: See pertinent positives and negatives per HPI.  Past Medical History:  Diagnosis Date  . Abdominal pain, epigastric   . Abnormal chest CT 09/08/2014  . Allergic urticaria   . Blood clotting disorder ([place])   . Breast cancer ([place]) 5/14   left breast  . Breast cancer ([place]) 02/2013   Dr Laurance Flatten ([facility])  . Diabetes mellitus   . Diverticulosis   . Fibromyalgia   . GERD (gastroesophageal reflux disease)   . Heart murmur   . Hyperlipidemia   . Lymphedema of arm    right  . Myalgia   . Myositis   . Neuromyositis   . Otitis media, chronic   . Pain syndrome, chronic   . Peptic ulcer   . Sicca syndrome ([place])   . Sjogren's disease ([place])   . Sleep apnea   . SOB (shortness of breath)   . Systemic involvement of connective tissue [facility])     Past Surgical History:  Procedure Laterality Date  . BREAST SURGERY Bilateral    mastectomy  . COLONOSCOPY WITH PROPOFOL N/A 04/22/2017   Procedure: COLONOSCOPY WITH PROPOFOL;  Surgeon: Lollie Sails, MD;  Location: [facility] ENDOSCOPY;  Service: Endoscopy;  Laterality: N/A;  . ESOPHAGOGASTRODUODENOSCOPY (EGD) WITH PROPOFOL N/A 04/22/2017   Procedure: ESOPHAGOGASTRODUODENOSCOPY (EGD) WITH PROPOFOL;  Surgeon: Lollie Sails, MD;  Location: [facility] ENDOSCOPY;  Service: Endoscopy;  Laterality: N/A;  . MASTECTOMY Bilateral 03/23/13    Family History  Problem Relation Age of Onset  . Diabetes Mother   . Heart disease Mother   . Hyperlipidemia Mother   . Heart failure Mother   . Diabetes Other   . Heart disease Brother   . Hyperlipidemia Brother   . Colon polyps Brother   . Stroke Maternal Grandmother   . Breast cancer Maternal Aunt    . Colon cancer Paternal Grandmother   . Rheum arthritis Sister   . Psoriasis Other        psoriatic arthritis   . Ovarian cancer Neg Hx     SOCIAL HX: lives at home   Current Outpatient Medications:  .  albuterol (PROVENTIL HFA;VENTOLIN HFA) 108 (90 Base) MCG/ACT inhaler, Inhale 2 puffs into the lungs every 6 (six) hours as needed for wheezing or shortness of breath., Disp: 1 Inhaler, Rfl: 0 .  ALPRAZolam (XANAX) 0.5 MG tablet, TAKE 1 TABLET (0.5 MG TOTAL) BY MOUTH 4 (FOUR) TIMES DAILY AS NEEDED FOR ANXIETY., Disp: 120 tablet, Rfl: 0 .  clobetasol ointment (TEMOVATE) 0.05 %, Apply topically. Vaginally, Disp: , Rfl:  .  Cyanocobalamin 1000 MCG CAPS, Take by mouth., Disp: , Rfl:  .  dicyclomine (BENTYL) 10 MG capsule, Take 1 capsule (10 mg total) by mouth 3 (three) times daily as needed for spasms., Disp: 60 capsule, Rfl: 1 .  Emollient (CERAVE) CREA, Apply topically., Disp: , Rfl:  .  FREESTYLE LITE test strip, CHECK 6 TIMES DAILY, Disp: 100 each, Rfl: 3 .  glucose blood (ONE TOUCH ULTRA TEST) test strip, CHECK SIX TIMES DAILY, Disp: 100 each, Rfl: 11 .  indomethacin (INDOCIN) 50 MG capsule, Take 1 capsule (50 mg total) by mouth 3 (three) times daily with meals., Disp: 15 capsule, Rfl: 0 .  insulin aspart (NOVOLOG FLEXPEN) 100 UNIT/ML FlexPen, Take up to 2 - 4 units before meals up to 3 times daily, Disp: , Rfl:  .  Insulin Syringe-Needle U-100 (INSULIN SYRINGE .5CC/31GX5/16") 31G X 5/16" 0.5 ML MISC, Use twice daily with injections, Disp: , Rfl:  .  ketoconazole (NIZORAL) 2 % cream, Apply 1 application topically 2 (two) times daily., Disp: , Rfl: 3 .  ketoconazole (NIZORAL) 2 % shampoo, Apply 1 application topically once a week., Disp: , Rfl: 11 .  Lancets MISC, Use. BAYER CONTOUR LANCETS, Disp: , Rfl:  .  nystatin cream (MYCOSTATIN), Apply 1 application topically as needed., Disp: , Rfl:  .  ondansetron (ZOFRAN) 4 MG tablet, TAKE 1 TABLET BY MOUTH EVERY 8 HOURS AS NEEDED FOR NAUSEA AND  VOMITING, Disp: 18 tablet, Rfl: 1 .  ONETOUCH DELICA LANCETS 35H MISC, TWICE DAILY, Disp: 100 each, Rfl: 3 .  pantoprazole (PROTONIX) 40 MG tablet, TAKE 1 TABLET BY MOUTH EVERY DAY, Disp: 90 tablet, Rfl: 1 .  Polyethyl Glycol-Propyl Glycol (SYSTANE OP), Apply to eye., Disp: , Rfl:  .  traMADol (ULTRAM) 50 MG tablet, TAKE 1 TABLET (50 MG TOTAL) BY MOUTH EVERY 6 (SIX) HOURS AS NEEDED. FOR PAIN, Disp: 120 tablet, Rfl: 1 .  TRESIBA FLEXTOUCH 100 UNIT/ML SOPN FlexTouch Pen, 20 Units at bedtime., Disp: , Rfl: 5 .  Cholecalciferol (VITAMIN D3) 1000 units CAPS, Take by mouth., Disp: , Rfl:  .  famotidine (PEPCID) 20 MG tablet, Take 1 tablet (20 mg total) by mouth at bedtime for 14 days., Disp: 14 tablet, Rfl: 0 .  tamoxifen (NOLVADEX) 20 MG tablet, Take 20 mg by mouth daily., Disp: , Rfl:   EXAM:  VITALS per patient if applicable:  GENERAL: alert, oriented, appears well and in no acute distress  PSYCH/NEURO: pleasant and cooperative, no obvious depression or anxiety, speech and thought processing grossly intact  SKIN: diffuse skin rash   MSK: diffuse joint pain   ASSESSMENT AND PLAN:  Discussed the following assessment and plan:  Fever and rash unknown origin -  Rash -  -refer to dermatology est with Dr. Phillip Heal for skin biopsy of rash  Cmet, cbc, ANA, ESR, CRP, anticcp, RF, RMSF, lyme, urine and blood cultures, hiv, rpr, uric acid   Arthralgia, diffuse  History of breast cancer - Plan: CEA, Cancer antigen 27.29 f/u [facility] h/o   Hep C negative 12/24/16     I discussed the assessment and treatment plan with the patient. The patient was provided an opportunity to ask questions and all were answered. The patient agreed with the plan and demonstrated an understanding of the instructions.   The patient was advised to call back or seek an in-person evaluation if the symptoms worsen or if the condition fails to improve as anticipated.  Time spent 25 minutes  Delorise Jackson, MD

## 2019-02-22 ENCOUNTER — Encounter: Payer: Self-pay | Admitting: Internal Medicine

## 2019-02-22 NOTE — Telephone Encounter (Signed)
Patient did a visit with Dr McLean-Scocuzza.

## 2019-02-23 ENCOUNTER — Encounter: Payer: Self-pay | Admitting: Internal Medicine

## 2019-02-24 DIAGNOSIS — J342 Deviated nasal septum: Secondary | ICD-10-CM | POA: Diagnosis not present

## 2019-02-25 DIAGNOSIS — J301 Allergic rhinitis due to pollen: Secondary | ICD-10-CM | POA: Diagnosis not present

## 2019-02-25 DIAGNOSIS — J343 Hypertrophy of nasal turbinates: Secondary | ICD-10-CM | POA: Diagnosis not present

## 2019-02-25 DIAGNOSIS — J342 Deviated nasal septum: Secondary | ICD-10-CM | POA: Diagnosis not present

## 2019-02-25 DIAGNOSIS — J3489 Other specified disorders of nose and nasal sinuses: Secondary | ICD-10-CM | POA: Diagnosis not present

## 2019-02-25 LAB — URINALYSIS, ROUTINE W REFLEX MICROSCOPIC
Bilirubin, UA: NEGATIVE
Glucose, UA: NEGATIVE
Nitrite, UA: NEGATIVE
Protein,UA: NEGATIVE
RBC, UA: NEGATIVE
Specific Gravity, UA: 1.012 (ref 1.005–1.030)
Urobilinogen, Ur: 0.2 mg/dL (ref 0.2–1.0)
pH, UA: 5.5 (ref 5.0–7.5)

## 2019-02-25 LAB — URIC ACID: Uric Acid: 4.2 mg/dL (ref 2.5–7.1)

## 2019-02-25 LAB — CBC WITH DIFFERENTIAL/PLATELET
Basophils Absolute: 0 10*3/uL (ref 0.0–0.2)
Basos: 0 %
EOS (ABSOLUTE): 0.2 10*3/uL (ref 0.0–0.4)
Eos: 2 %
Hematocrit: 40.4 % (ref 34.0–46.6)
Hemoglobin: 13.5 g/dL (ref 11.1–15.9)
Immature Grans (Abs): 0.1 10*3/uL (ref 0.0–0.1)
Immature Granulocytes: 1 %
Lymphocytes Absolute: 1.6 10*3/uL (ref 0.7–3.1)
Lymphs: 15 %
MCH: 30.6 pg (ref 26.6–33.0)
MCHC: 33.4 g/dL (ref 31.5–35.7)
MCV: 92 fL (ref 79–97)
Monocytes Absolute: 0.9 10*3/uL (ref 0.1–0.9)
Monocytes: 8 %
Neutrophils Absolute: 7.9 10*3/uL — ABNORMAL HIGH (ref 1.4–7.0)
Neutrophils: 74 %
Platelets: 278 10*3/uL (ref 150–450)
RBC: 4.41 x10E6/uL (ref 3.77–5.28)
RDW: 13.2 % (ref 11.7–15.4)
WBC: 10.8 10*3/uL (ref 3.4–10.8)

## 2019-02-25 LAB — RHEUMATOID FACTOR: Rheumatoid fact SerPl-aCnc: 11.9 IU/mL (ref 0.0–13.9)

## 2019-02-25 LAB — COMPREHENSIVE METABOLIC PANEL
ALT: 31 IU/L (ref 0–32)
AST: 25 IU/L (ref 0–40)
Albumin/Globulin Ratio: 1.3 (ref 1.2–2.2)
Albumin: 4 g/dL (ref 3.8–4.8)
Alkaline Phosphatase: 196 IU/L — ABNORMAL HIGH (ref 39–117)
BUN/Creatinine Ratio: 14 (ref 12–28)
BUN: 11 mg/dL (ref 8–27)
Bilirubin Total: 0.5 mg/dL (ref 0.0–1.2)
CO2: 21 mmol/L (ref 20–29)
Calcium: 9 mg/dL (ref 8.7–10.3)
Chloride: 93 mmol/L — ABNORMAL LOW (ref 96–106)
Creatinine, Ser: 0.8 mg/dL (ref 0.57–1.00)
GFR calc Af Amer: 92 mL/min/{1.73_m2} (ref >59)
GFR calc non Af Amer: 80 mL/min/{1.73_m2} (ref >59)
Globulin, Total: 3.2 g/dL (ref 1.5–4.5)
Glucose: 204 mg/dL — ABNORMAL HIGH (ref 65–99)
Potassium: 4.4 mmol/L (ref 3.5–5.2)
Sodium: 131 mmol/L — ABNORMAL LOW (ref 134–144)
Total Protein: 7.2 g/dL (ref 6.0–8.5)

## 2019-02-25 LAB — ANA: Anti Nuclear Antibody (ANA): POSITIVE — AB

## 2019-02-25 LAB — MICROSCOPIC EXAMINATION: Epithelial Cells (non renal): >10 /hpf — AB (ref 0–10)

## 2019-02-25 LAB — SEDIMENTATION RATE: Sed Rate: 49 mm/hr — ABNORMAL HIGH (ref 0–40)

## 2019-02-25 LAB — CYCLIC CITRUL PEPTIDE ANTIBODY, IGG/IGA: Cyclic Citrullin Peptide Ab: 8 units (ref 0–19)

## 2019-02-25 LAB — ROCKY MTN SPOTTED FVR ABS PNL(IGG+IGM)
RMSF IgG: NEGATIVE
RMSF IgM: 0.55 index (ref 0.00–0.89)

## 2019-02-25 LAB — CULTURE, BLOOD (SINGLE)

## 2019-02-25 LAB — CANCER ANTIGEN 27.29: CA 27.29: 15.5 U/mL (ref 0.0–38.6)

## 2019-02-25 LAB — URINE CULTURE

## 2019-02-25 LAB — RPR: RPR Ser Ql: NONREACTIVE

## 2019-02-25 LAB — C-REACTIVE PROTEIN: CRP: 85 mg/L — ABNORMAL HIGH (ref 0–10)

## 2019-02-25 LAB — B. BURGDORFI ANTIBODIES: Lyme IgG/IgM Ab: <0.91 {ISR} (ref 0.00–0.90)

## 2019-02-25 LAB — CEA: CEA: 3.2 ng/mL (ref 0.0–4.7)

## 2019-02-25 LAB — HIV ANTIBODY (ROUTINE TESTING W REFLEX): HIV Screen 4th Generation wRfx: NONREACTIVE

## 2019-02-26 ENCOUNTER — Other Ambulatory Visit: Payer: Self-pay | Admitting: Family Medicine

## 2019-03-03 DIAGNOSIS — L508 Other urticaria: Secondary | ICD-10-CM | POA: Diagnosis not present

## 2019-03-03 DIAGNOSIS — L218 Other seborrheic dermatitis: Secondary | ICD-10-CM | POA: Diagnosis not present

## 2019-03-03 DIAGNOSIS — L578 Other skin changes due to chronic exposure to nonionizing radiation: Secondary | ICD-10-CM | POA: Diagnosis not present

## 2019-03-10 ENCOUNTER — Ambulatory Visit: Payer: Self-pay | Admitting: Internal Medicine

## 2019-03-12 ENCOUNTER — Telehealth: Payer: Self-pay | Admitting: Internal Medicine

## 2019-03-12 NOTE — Progress Notes (Deleted)
[facility]    Assessment and Plan:  Dyspnea on exertion with restrictive lung disease. -This may be due to a combination of Sjogren's syndrome effects of the lung as well as radiation fibrosis. - She notes that metered-dose inhaler of albuterol seems to help, I will give her a sample of Brio 100, she is asked to call back for prescription if she finds it useful.  I am also going to get a baseline chest x-ray today due to dyspnea on exertion.  Obstructive sleep apnea. -Currently doing well with CPAP at pressure of 8. -Continue using CPAP nightly.  No orders of the defined types were placed in this encounter.  No orders of the defined types were placed in this encounter.  No follow-ups on file.   Date: 03/12/2019  MRN# [id number] Dechelle Attaway 17-Oct-1955    Amy Gothard is a 63 y.o. old female seen in consultation for chief complaint of:    No chief complaint on file.   HPI:  Anisia Leija has a complex medical history of breast cancer, Sjorgen disease, fibromyalgia, obstructive sleep apnea, obesity, diabetes and hyperlipidemia. She was last seen here about a year ago. She had dyspnea which seemed to respond to albuterol. She also had restrictive lung disease, thought to be possible related from history of radiation.        PFTs and mild restriction, RV 74, DLCO 61. Sleep study February 2016 AHI 14.3, AutoPap 5-15.  **Personally reviewed download data, 30 days as of 01/07/18.  Usage greater than 4 hours is 26/30 days.  Average usage on days used is 5 hours 50 minutes.  Set pressure is 8.  Residual AHI is 1.  This demonstrates very good compliance with excellent control of obstructive sleep apnea. **Sleep study, CPAP titration 04/09/16, CPAP was recommended at a pressure of 8.  **Imaging personally reviewed, 01/16/17; lungs unremarkable. **CBC 12/22/17, absolute eosinophil count 300.  Medication:    Current Outpatient Medications:  .  albuterol  (PROVENTIL HFA;VENTOLIN HFA) 108 (90 Base) MCG/ACT inhaler, Inhale 2 puffs into the lungs every 6 (six) hours as needed for wheezing or shortness of breath., Disp: 1 Inhaler, Rfl: 0 .  ALPRAZolam (XANAX) 0.5 MG tablet, TAKE 1 TABLET (0.5 MG TOTAL) BY MOUTH 4 (FOUR) TIMES DAILY AS NEEDED FOR ANXIETY., Disp: 120 tablet, Rfl: 0 .  Cholecalciferol (VITAMIN D3) 1000 units CAPS, Take by mouth., Disp: , Rfl:  .  clobetasol ointment (TEMOVATE) 0.05 %, Apply topically. Vaginally, Disp: , Rfl:  .  Cyanocobalamin 1000 MCG CAPS, Take by mouth., Disp: , Rfl:  .  dicyclomine (BENTYL) 10 MG capsule, Take 1 capsule (10 mg total) by mouth 3 (three) times daily as needed for spasms., Disp: 60 capsule, Rfl: 1 .  Emollient (CERAVE) CREA, Apply topically., Disp: , Rfl:  .  famotidine (PEPCID) 20 MG tablet, Take 1 tablet (20 mg total) by mouth at bedtime for 14 days., Disp: 14 tablet, Rfl: 0 .  FREESTYLE LITE test strip, CHECK 6 TIMES DAILY, Disp: 100 each, Rfl: 3 .  glucose blood (ONE TOUCH ULTRA TEST) test strip, CHECK SIX TIMES DAILY, Disp: 100 each, Rfl: 11 .  indomethacin (INDOCIN) 50 MG capsule, Take 1 capsule (50 mg total) by mouth 3 (three) times daily with meals., Disp: 15 capsule, Rfl: 0 .  insulin aspart (NOVOLOG FLEXPEN) 100 UNIT/ML FlexPen, Take up to 2 - 4 units before meals up to 3 times daily, Disp: , Rfl:  .  Insulin Syringe-Needle  U-100 (INSULIN SYRINGE .5CC/31GX5/16") 31G X 5/16" 0.5 ML MISC, Use twice daily with injections, Disp: , Rfl:  .  ketoconazole (NIZORAL) 2 % cream, Apply 1 application topically 2 (two) times daily., Disp: , Rfl: 3 .  ketoconazole (NIZORAL) 2 % shampoo, Apply 1 application topically once a week., Disp: , Rfl: 11 .  Lancets MISC, Use. BAYER CONTOUR LANCETS, Disp: , Rfl:  .  nystatin cream (MYCOSTATIN), Apply 1 application topically as needed., Disp: , Rfl:  .  ondansetron (ZOFRAN) 4 MG tablet, TAKE 1 TABLET BY MOUTH EVERY 8 HOURS AS NEEDED FOR NAUSEA AND VOMITING, Disp: 18  tablet, Rfl: 1 .  ONETOUCH DELICA LANCETS 35D MISC, TWICE DAILY, Disp: 100 each, Rfl: 3 .  pantoprazole (PROTONIX) 40 MG tablet, TAKE 1 TABLET BY MOUTH EVERY DAY, Disp: 90 tablet, Rfl: 1 .  Polyethyl Glycol-Propyl Glycol (SYSTANE OP), Apply to eye., Disp: , Rfl:  .  tamoxifen (NOLVADEX) 20 MG tablet, Take 20 mg by mouth daily., Disp: , Rfl:  .  traMADol (ULTRAM) 50 MG tablet, TAKE 1 TABLET (50 MG TOTAL) BY MOUTH EVERY 6 (SIX) HOURS AS NEEDED. FOR PAIN, Disp: 120 tablet, Rfl: 1 .  TRESIBA FLEXTOUCH 100 UNIT/ML SOPN FlexTouch Pen, 20 Units at bedtime., Disp: , Rfl: 5   Allergies:  Gabapentin, Oxycodone, Carbamazepine, Dulaglutide, Insulin glargine, Levaquin [levofloxacin], Metformin, Methylprednisolone, Morphine, Pregabalin, Venlafaxine, Buprenorphine, Canagliflozin, Escitalopram, and Hydroxychloroquine    LABORATORY PANEL:   CBC No results for input(s): WBC, HGB, HCT, PLT in the last 168 hours. ------------------------------------------------------------------------------------------------------------------  Chemistries  No results for input(s): NA, K, CL, CO2, GLUCOSE, BUN, CREATININE, CALCIUM, MG, AST, ALT, ALKPHOS, BILITOT in the last 168 hours.  Invalid input(s): GFRCGP ------------------------------------------------------------------------------------------------------------------  Cardiac Enzymes No results for input(s): TROPONINI in the last 168 hours. ------------------------------------------------------------  RADIOLOGY:  No results found.     Thank  you for the consultation and for allowing [facility] to assist in the care of your patient. Our recommendations are noted above.  Please contact us if we can be of further service.  Marda Stalker, M.D., F.C.C.P.  Board Certified in Internal Medicine, Pulmonary Medicine, [place], and Sleep Medicine.  [facility] Office Number: [phone]  03/12/2019

## 2019-03-12 NOTE — Telephone Encounter (Signed)
Called patient for COVID-19 pre-screening for in office visit.  Have you recently traveled any where out of the local area in the last 2 weeks? NO Have you been in close contact with a person diagnosed with COVID-19 within the last 2 weeks? NO Do you currently have any of the following symptoms? If so, when did they start? Cough     Diarrhea  Joint Pain Fever      Muscle Pain  Red eyes Shortness of breath   Abdominal pain Vomiting Loss of smell    Rash         Sore Throat Headache    Weakness Bruising or bleeding   Okay to proceed with visit 03/15/2019

## 2019-03-15 ENCOUNTER — Ambulatory Visit: Payer: PPO | Admitting: Internal Medicine

## 2019-03-16 ENCOUNTER — Telehealth: Payer: Self-pay | Admitting: *Deleted

## 2019-03-16 DIAGNOSIS — N904 Leukoplakia of vulva: Secondary | ICD-10-CM | POA: Diagnosis not present

## 2019-03-16 NOTE — Telephone Encounter (Signed)
Contacted patient to schedule lung screening scan. Patient requests to call me back to schedule in future.

## 2019-03-22 DIAGNOSIS — K760 Fatty (change of) liver, not elsewhere classified: Secondary | ICD-10-CM | POA: Diagnosis not present

## 2019-03-22 DIAGNOSIS — K210 Gastro-esophageal reflux disease with esophagitis: Secondary | ICD-10-CM | POA: Diagnosis not present

## 2019-03-22 DIAGNOSIS — R198 Other specified symptoms and signs involving the digestive system and abdomen: Secondary | ICD-10-CM | POA: Diagnosis not present

## 2019-03-29 ENCOUNTER — Other Ambulatory Visit: Payer: Self-pay | Admitting: Family Medicine

## 2019-04-12 ENCOUNTER — Other Ambulatory Visit: Payer: Self-pay | Admitting: Family Medicine

## 2019-04-13 ENCOUNTER — Telehealth: Payer: Self-pay

## 2019-04-13 MED ORDER — TRAMADOL HCL 50 MG PO TABS: 50.0000 mg | ORAL_TABLET | Freq: Four times a day (QID) | ORAL | 1 refills | Status: DC | PRN

## 2019-04-13 NOTE — Telephone Encounter (Signed)
Message sent to provider.  Pratik Dalziel,cma  

## 2019-04-13 NOTE — Telephone Encounter (Signed)
Sent to pharmacy 

## 2019-04-14 NOTE — Telephone Encounter (Signed)
Called and informed pt that her medication was sent to pharmacy.  Silvio Sausedo,cma

## 2019-04-15 ENCOUNTER — Telehealth: Payer: Self-pay | Admitting: Family Medicine

## 2019-04-15 NOTE — Telephone Encounter (Signed)
Pt dropped off long disability papers to be signed. Papers or up front in color folder.

## 2019-04-15 NOTE — Telephone Encounter (Signed)
Papers were picked up and placed in signed folder.  Tyrihanna Wingert,cma

## 2019-04-16 ENCOUNTER — Encounter: Payer: Self-pay | Admitting: Family Medicine

## 2019-04-16 ENCOUNTER — Ambulatory Visit (INDEPENDENT_AMBULATORY_CARE_PROVIDER_SITE_OTHER): Payer: PPO | Admitting: Family Medicine

## 2019-04-16 ENCOUNTER — Other Ambulatory Visit: Payer: Self-pay

## 2019-04-16 DIAGNOSIS — M3500 Sicca syndrome, unspecified: Secondary | ICD-10-CM | POA: Diagnosis not present

## 2019-04-16 DIAGNOSIS — L509 Urticaria, unspecified: Secondary | ICD-10-CM

## 2019-04-16 DIAGNOSIS — L500 Allergic urticaria: Secondary | ICD-10-CM

## 2019-04-16 DIAGNOSIS — J329 Chronic sinusitis, unspecified: Secondary | ICD-10-CM | POA: Diagnosis not present

## 2019-04-16 NOTE — Assessment & Plan Note (Signed)
I suspect this is contributing to most of her pain though there could be some measure of osteoarthritis contributing as well.  We will refer her to rheumatology at [facility].  She specifically requested Kristina Mccoy and a referral will be sent to her.

## 2019-04-16 NOTE — Assessment & Plan Note (Signed)
Found to have hives based on reported dermatology evaluation.  She will continue Zyrtec and Benadryl.  We will refer to an allergist at [facility] for further evaluation.

## 2019-04-16 NOTE — Progress Notes (Signed)
Virtual Visit via telephone Note  This visit type was conducted due to national recommendations for restrictions regarding the COVID-19 pandemic (e.g. social distancing).  This format is felt to be most appropriate for this patient at this time.  All issues noted in this document were discussed and addressed.  No physical exam was performed (except for noted visual exam findings with Video Visits).   I connected with Bonnell Public today at  2:15 PM EDT by telephone and verified that I am speaking with the correct person using two identifiers. Location patient: home Location provider: work Persons participating in the virtual visit: patient, provider  I discussed the limitations, risks, security and privacy concerns of performing an evaluation and management service by telephone and the availability of in person appointments. I also discussed with the patient that there may be a patient responsible charge related to this service. The patient expressed understanding and agreed to proceed.  Interactive audio and video telecommunications were attempted between this provider and patient, however failed, due to patient having technical difficulties OR patient did not have access to video capability.  We continued and completed visit with audio only.   Reason for visit: follow-up  HPI: Rash/fever: found to have hives. Saw derm and they placed her on benadryl and zyrtec. Rash has improved to a certain degree and will occasionally pop back up.  She has not had any mouth, throat, or tongue involvement with this.  Notes her fever has improved as well.  Chronic joint pain/Sjogren's syndrome: She continues to have fairly significant issues with this.  She has chronic scattered joint pains and bone pain.  Prior nuclear medicine bone scan did reveal scattered areas of arthritis though no findings of metastatic disease.  She has had some nodules come up on her joints that will become swollen, red, and painful.   She is requesting a referral to a new rheumatologist.  Chronic sinusitis: She has seen ENT and underwent imaging which she states revealed that her sinusitis issue is related to an anatomy abnormality.  She has been advised to have surgery by ENT though she is waiting on feeling better with regards to her other issues.  She is on Nasacort and Mucinex D which have been somewhat beneficial.  She states she was advised that antibiotics would not help with her current symptoms.   ROS: See pertinent positives and negatives per HPI.  Past Medical History:  Diagnosis Date  . Abdominal pain, epigastric   . Abnormal chest CT 09/08/2014  . Allergic urticaria   . Blood clotting disorder ([place])   . Breast cancer ([place]) 5/14   left breast  . Breast cancer ([place]) 02/2013   Dr Laurance Flatten ([facility])  . Diabetes mellitus   . Diverticulosis   . Fibromyalgia   . GERD (gastroesophageal reflux disease)   . Heart murmur   . Hyperlipidemia   . Lymphedema of arm    right  . Myalgia   . Myositis   . Neuromyositis   . Otitis media, chronic   . Pain syndrome, chronic   . Peptic ulcer   . Sicca syndrome ([place])   . Sjogren's disease ([place])   . Sleep apnea   . SOB (shortness of breath)   . Systemic involvement of connective tissue [facility])     Past Surgical History:  Procedure Laterality Date  . BREAST SURGERY Bilateral    mastectomy  . COLONOSCOPY WITH PROPOFOL N/A 04/22/2017   Procedure: COLONOSCOPY WITH PROPOFOL;  Surgeon: Loistine Simas  U, MD;  Location: [facility] ENDOSCOPY;  Service: Endoscopy;  Laterality: N/A;  . ESOPHAGOGASTRODUODENOSCOPY (EGD) WITH PROPOFOL N/A 04/22/2017   Procedure: ESOPHAGOGASTRODUODENOSCOPY (EGD) WITH PROPOFOL;  Surgeon: Lollie Sails, MD;  Location: [facility] ENDOSCOPY;  Service: Endoscopy;  Laterality: N/A;  . MASTECTOMY Bilateral 03/23/13    Family History  Problem Relation Age of Onset  . Diabetes Mother   . Heart disease Mother   . Hyperlipidemia Mother   . Heart failure  Mother   . Diabetes Other   . Heart disease Brother   . Hyperlipidemia Brother   . Colon polyps Brother   . Stroke Maternal Grandmother   . Breast cancer Maternal Aunt   . Colon cancer Paternal Grandmother   . Rheum arthritis Sister   . Psoriasis Other        psoriatic arthritis   . Ovarian cancer Neg Hx     SOCIAL HX: Former smoker.   Current Outpatient Medications:  .  albuterol (PROVENTIL HFA;VENTOLIN HFA) 108 (90 Base) MCG/ACT inhaler, Inhale 2 puffs into the lungs every 6 (six) hours as needed for wheezing or shortness of breath., Disp: 1 Inhaler, Rfl: 0 .  ALPRAZolam (XANAX) 0.5 MG tablet, TAKE 1 TABLET (0.5 MG TOTAL) BY MOUTH 4 (FOUR) TIMES DAILY AS NEEDED FOR ANXIETY., Disp: 120 tablet, Rfl: 0 .  Cholecalciferol (VITAMIN D3) 1000 units CAPS, Take by mouth., Disp: , Rfl:  .  clobetasol ointment (TEMOVATE) 0.05 %, Apply topically. Vaginally, Disp: , Rfl:  .  Cyanocobalamin 1000 MCG CAPS, Take by mouth., Disp: , Rfl:  .  dicyclomine (BENTYL) 10 MG capsule, Take 1 capsule (10 mg total) by mouth 3 (three) times daily as needed for spasms., Disp: 60 capsule, Rfl: 1 .  Emollient (CERAVE) CREA, Apply topically., Disp: , Rfl:  .  FREESTYLE LITE test strip, CHECK 6 TIMES DAILY, Disp: 100 each, Rfl: 3 .  glipiZIDE (GLUCOTROL) 5 MG tablet, Take 5 mg by mouth daily before breakfast., Disp: , Rfl:  .  glucose blood (ONE TOUCH ULTRA TEST) test strip, CHECK SIX TIMES DAILY, Disp: 100 each, Rfl: 11 .  indomethacin (INDOCIN) 50 MG capsule, Take 1 capsule (50 mg total) by mouth 3 (three) times daily with meals., Disp: 15 capsule, Rfl: 0 .  insulin aspart (NOVOLOG FLEXPEN) 100 UNIT/ML FlexPen, Take up to 2 - 4 units before meals up to 3 times daily, Disp: , Rfl:  .  insulin regular (NOVOLIN R) 100 units/mL injection, Inject 100 Units into the skin 3 (three) times daily before meals., Disp: , Rfl:  .  Insulin Syringe-Needle U-100 (INSULIN SYRINGE .5CC/31GX5/16") 31G X 5/16" 0.5 ML MISC, Use twice  daily with injections, Disp: , Rfl:  .  ketoconazole (NIZORAL) 2 % cream, Apply 1 application topically 2 (two) times daily., Disp: , Rfl: 3 .  ketoconazole (NIZORAL) 2 % shampoo, Apply 1 application topically once a week., Disp: , Rfl: 11 .  Lancets MISC, Use. BAYER CONTOUR LANCETS, Disp: , Rfl:  .  nystatin cream (MYCOSTATIN), Apply 1 application topically as needed., Disp: , Rfl:  .  ondansetron (ZOFRAN) 4 MG tablet, TAKE 1 TABLET BY MOUTH EVERY 8 HOURS AS NEEDED FOR NAUSEA AND VOMITING, Disp: 18 tablet, Rfl: 1 .  ONETOUCH DELICA LANCETS 24O MISC, TWICE DAILY, Disp: 100 each, Rfl: 3 .  pantoprazole (PROTONIX) 40 MG tablet, TAKE 1 TABLET BY MOUTH EVERY DAY, Disp: 90 tablet, Rfl: 1 .  Polyethyl Glycol-Propyl Glycol (SYSTANE OP), Apply to eye., Disp: , Rfl:  .  pseudoephedrine-guaifenesin (MUCINEX D) 60-600 MG 12 hr tablet, Take 1 tablet by mouth every 12 (twelve) hours., Disp: , Rfl:  .  tamoxifen (NOLVADEX) 20 MG tablet, Take 20 mg by mouth daily., Disp: , Rfl:  .  traMADol (ULTRAM) 50 MG tablet, Take 1 tablet (50 mg total) by mouth every 6 (six) hours as needed. for pain, Disp: 120 tablet, Rfl: 1 .  TRESIBA FLEXTOUCH 100 UNIT/ML SOPN FlexTouch Pen, 20 Units at bedtime., Disp: , Rfl: 5 .  triamcinolone (NASACORT ALLERGY 24HR) 55 MCG/ACT AERO nasal inhaler, Place 2 sprays into the nose daily., Disp: , Rfl:  .  famotidine (PEPCID) 20 MG tablet, Take 1 tablet (20 mg total) by mouth at bedtime for 14 days., Disp: 14 tablet, Rfl: 0  EXAM: This was a telehealth telephone visit notes no physical exam was completed.   ASSESSMENT AND PLAN:  Discussed the following assessment and plan:  Allergic urticaria Found to have hives based on reported dermatology evaluation.  She will continue Zyrtec and Benadryl.  We will refer to an allergist at [facility] for further evaluation.  Sjogren's syndrome ([place]) I suspect this is contributing to most of her pain though there could be some measure of osteoarthritis  contributing as well.  We will refer her to rheumatology at [facility].  She specifically requested Alphonsa Overall and a referral will be sent to her.  Recurrent sinusitis She has seen ENT and notes they have recommended surgery.  We will get her to see the other specialist regarding her other issues and see if she will start to feel better with regards to those issues and then she can consider surgery per her request.    I discussed the assessment and treatment plan with the patient. The patient was provided an opportunity to ask questions and all were answered. The patient agreed with the plan and demonstrated an understanding of the instructions.   The patient was advised to call back or seek an in-person evaluation if the symptoms worsen or if the condition fails to improve as anticipated.  I provided 21 minutes of non-face-to-face time during this encounter.   [name], MD

## 2019-04-16 NOTE — Assessment & Plan Note (Signed)
She has seen ENT and notes they have recommended surgery.  We will get her to see the other specialist regarding her other issues and see if she will start to feel better with regards to those issues and then she can consider surgery per her request.

## 2019-04-23 NOTE — Telephone Encounter (Signed)
Pt called to see when she can pick up the paper work that she dropped off/ please call when she can come pick them up

## 2019-04-23 NOTE — Telephone Encounter (Signed)
Pt called to see when she can pick up the paper work that she dropped off/ please call when she can come pick them up.  Nina,cma

## 2019-04-26 ENCOUNTER — Encounter: Payer: Self-pay | Admitting: Family Medicine

## 2019-04-26 NOTE — Telephone Encounter (Signed)
Busy signal will try again to call patient and inform her to send more paperwork to fill out.  Nina,cma

## 2019-04-26 NOTE — Telephone Encounter (Signed)
I have not seen this paperwork. Please check with the patient again to see if she can drop off another copy.

## 2019-04-26 NOTE — Telephone Encounter (Signed)
Lmtcb, informing patient by voicemail that we have not found a form she needed filled out , if she could send another form by fax or in person and we would take care of it.  Nina,cma

## 2019-04-27 ENCOUNTER — Encounter: Payer: Self-pay | Admitting: Family Medicine

## 2019-04-27 DIAGNOSIS — Z0279 Encounter for issue of other medical certificate: Secondary | ICD-10-CM

## 2019-04-27 NOTE — Telephone Encounter (Signed)
Called patient and informed her that her paperwork was ready for pickup, pt states she will pick it up tomorrow.  Nina,cma

## 2019-04-28 ENCOUNTER — Other Ambulatory Visit: Payer: Self-pay | Admitting: Family Medicine

## 2019-04-29 NOTE — Telephone Encounter (Signed)
Refilled: 03/29/2019 Last OV: 04/16/2019 Next OV: 08/23/2019

## 2019-04-30 ENCOUNTER — Telehealth: Payer: Self-pay | Admitting: *Deleted

## 2019-04-30 NOTE — Telephone Encounter (Signed)
Copied from [place] [id number]. Topic: General - Other >> Apr 30, 2019  2:17 PM Leward Quan A wrote: Reason for CRM: Helene Kelp with [facility] [employer] [facility] called to say that patient have lost some weight and need new Breast Prosthesis. Asking for documentation of the patient weight loss over the last year so that they can bill the insurance. Any questions please call Teresa# [phone] Fax# [phone]

## 2019-05-02 NOTE — Telephone Encounter (Signed)
Letter created.  Please print so I can sign and then we can fax.

## 2019-05-03 DIAGNOSIS — K582 Mixed irritable bowel syndrome: Secondary | ICD-10-CM | POA: Diagnosis not present

## 2019-05-03 DIAGNOSIS — K760 Fatty (change of) liver, not elsewhere classified: Secondary | ICD-10-CM | POA: Diagnosis not present

## 2019-05-03 DIAGNOSIS — K210 Gastro-esophageal reflux disease with esophagitis: Secondary | ICD-10-CM | POA: Diagnosis not present

## 2019-05-04 NOTE — Telephone Encounter (Signed)
Letter printed and signed then faxed.  Deberah Adolf,cma

## 2019-05-12 NOTE — Telephone Encounter (Signed)
Kristina Mccoy with [facility] called and said that she has not received the letter and wants to know if it can be re-faxed to her [phone].

## 2019-05-17 ENCOUNTER — Encounter: Payer: Self-pay | Admitting: Family Medicine

## 2019-05-19 NOTE — Telephone Encounter (Signed)
New letter printed

## 2019-05-19 NOTE — Telephone Encounter (Signed)
Letter needs to be revised adding the weight before the loss with the date.  Nina,cma

## 2019-05-19 NOTE — Telephone Encounter (Signed)
Letter was faxed today to [facility] called to confirm.  Shadoe Bethel,cma

## 2019-05-27 DIAGNOSIS — C50112 Malignant neoplasm of central portion of left female breast: Secondary | ICD-10-CM | POA: Diagnosis not present

## 2019-05-27 DIAGNOSIS — C50111 Malignant neoplasm of central portion of right female breast: Secondary | ICD-10-CM | POA: Diagnosis not present

## 2019-05-27 DIAGNOSIS — Z9013 Acquired absence of bilateral breasts and nipples: Secondary | ICD-10-CM | POA: Diagnosis not present

## 2019-05-28 ENCOUNTER — Other Ambulatory Visit: Payer: Self-pay | Admitting: Family Medicine

## 2019-06-21 ENCOUNTER — Telehealth: Payer: Self-pay | Admitting: *Deleted

## 2019-06-21 NOTE — Telephone Encounter (Signed)
Left message for patient to notify them that it is time to schedule annual low dose lung cancer screening CT scan. Instructed patient to call back to verify information prior to the scan being scheduled.  

## 2019-06-22 ENCOUNTER — Encounter: Payer: Self-pay | Admitting: *Deleted

## 2019-06-28 ENCOUNTER — Other Ambulatory Visit: Payer: Self-pay | Admitting: Family Medicine

## 2019-07-09 DIAGNOSIS — Z794 Long term (current) use of insulin: Secondary | ICD-10-CM | POA: Diagnosis not present

## 2019-07-09 DIAGNOSIS — E1169 Type 2 diabetes mellitus with other specified complication: Secondary | ICD-10-CM | POA: Diagnosis not present

## 2019-07-09 DIAGNOSIS — E1165 Type 2 diabetes mellitus with hyperglycemia: Secondary | ICD-10-CM | POA: Diagnosis not present

## 2019-07-09 DIAGNOSIS — E785 Hyperlipidemia, unspecified: Secondary | ICD-10-CM | POA: Diagnosis not present

## 2019-07-30 ENCOUNTER — Other Ambulatory Visit: Payer: Self-pay | Admitting: Family Medicine

## 2019-07-30 ENCOUNTER — Telehealth: Payer: Self-pay | Admitting: *Deleted

## 2019-07-30 NOTE — Telephone Encounter (Signed)
Patient called in stating she will be dropping off her disability paperwork Monday for PCP to start working on. Please advise.  Nina,cma

## 2019-07-30 NOTE — Telephone Encounter (Signed)
Copied from [place] [id number]. Topic: General - Inquiry >> Jul 30, 2019  8:28 AM Richardo Priest, NT wrote: Reason for CRM: Patient called in stating she will be dropping off her disability paperwork Monday for PCP to start working on. Please advise.

## 2019-08-01 NOTE — Telephone Encounter (Signed)
Will await paperwork.

## 2019-08-02 ENCOUNTER — Telehealth: Payer: Self-pay | Admitting: Family Medicine

## 2019-08-02 DIAGNOSIS — Z139 Encounter for screening, unspecified: Secondary | ICD-10-CM

## 2019-08-02 NOTE — Telephone Encounter (Signed)
Patient has dropped off disability papers to be completed. Paper work is up front in Dr. Ellen Henri color folder.

## 2019-08-02 NOTE — Telephone Encounter (Signed)
Received disability form and put in in the signed basket.  Kannan Proia,cma

## 2019-08-03 NOTE — Telephone Encounter (Signed)
I have placed the referral

## 2019-08-03 NOTE — Telephone Encounter (Signed)
I called the patient and she is ok with the referral for the assessment.  Nina,cma

## 2019-08-03 NOTE — Telephone Encounter (Signed)
Please let the patient know that given the more detailed needs of the form I would like to refer her for a functional assessment with physical therapy. I need this to be completed to accurately complete the form. I can place a referral to them once you speak with her. Thanks.

## 2019-08-04 ENCOUNTER — Other Ambulatory Visit: Payer: Self-pay | Admitting: Family Medicine

## 2019-08-04 DIAGNOSIS — R110 Nausea: Secondary | ICD-10-CM

## 2019-08-04 NOTE — Telephone Encounter (Signed)
Please find out why she needs this medication refilled. I believe it was given for an acute issue previously.

## 2019-08-05 ENCOUNTER — Telehealth: Payer: Self-pay

## 2019-08-05 NOTE — Telephone Encounter (Signed)
Copied from [place] [phone] [id number]. Topic: General - Other >> Aug 05, 2019  3:26 PM Carolyn Stare wrote: Kristina Mccoy with [facility] call to say pt insurance will not cover FCE EVAl and req a call back

## 2019-08-06 NOTE — Telephone Encounter (Signed)
I called and spoke to Marion and she explained that the insurance would not pay for this FCE test because it is 2-3 hours long and she will call the patient and explain it to her about the cost.  Azel Gumina,cma

## 2019-08-09 NOTE — Telephone Encounter (Signed)
Pt states that now from time to time that she still experiences Nausea and has taken along as she needs it and is now out. Wanting refilled pls fu with pt if this can not be filled.

## 2019-08-11 NOTE — Telephone Encounter (Signed)
Please contact the patient and see if she has been scheduled for this to be completed yet. Her forms are due by 08/18/19 and she needs to have the functional exam completed for me to complete the forms. Thanks.

## 2019-08-12 ENCOUNTER — Encounter: Payer: PPO | Admitting: Family Medicine

## 2019-08-12 ENCOUNTER — Other Ambulatory Visit: Payer: Self-pay

## 2019-08-12 NOTE — Telephone Encounter (Signed)
I called the patient and she wanted to speak with the provider so she is scheduled for a telephone visit tomorrow.  Kristina Mccoy,cma

## 2019-08-12 NOTE — Telephone Encounter (Signed)
Left a messge for the patient to return a call to me.  Nina,cma

## 2019-08-12 NOTE — Telephone Encounter (Signed)
Pt called back.  No answer on FC line.

## 2019-08-13 ENCOUNTER — Other Ambulatory Visit: Payer: Self-pay | Admitting: Family Medicine

## 2019-08-13 ENCOUNTER — Ambulatory Visit: Payer: PPO | Admitting: Family Medicine

## 2019-08-13 ENCOUNTER — Encounter: Payer: Self-pay | Admitting: Family Medicine

## 2019-08-13 ENCOUNTER — Ambulatory Visit (INDEPENDENT_AMBULATORY_CARE_PROVIDER_SITE_OTHER): Payer: PPO | Admitting: Family Medicine

## 2019-08-13 ENCOUNTER — Other Ambulatory Visit: Payer: Self-pay

## 2019-08-13 ENCOUNTER — Telehealth: Payer: Self-pay | Admitting: Family Medicine

## 2019-08-13 DIAGNOSIS — M3500 Sicca syndrome, unspecified: Secondary | ICD-10-CM | POA: Diagnosis not present

## 2019-08-13 DIAGNOSIS — F329 Major depressive disorder, single episode, unspecified: Secondary | ICD-10-CM

## 2019-08-13 DIAGNOSIS — F419 Anxiety disorder, unspecified: Secondary | ICD-10-CM

## 2019-08-13 DIAGNOSIS — F32A Depression, unspecified: Secondary | ICD-10-CM

## 2019-08-13 DIAGNOSIS — R252 Cramp and spasm: Secondary | ICD-10-CM | POA: Diagnosis not present

## 2019-08-13 MED ORDER — DULOXETINE HCL 30 MG PO CPEP: ORAL_CAPSULE | ORAL | 1 refills | Status: DC

## 2019-08-13 NOTE — Addendum Note (Signed)
Addended by: [name] on: 08/13/2019 04:43 PM   Modules accepted: Orders

## 2019-08-13 NOTE — Assessment & Plan Note (Addendum)
Patient continues to have significant symptoms of fatigue and pain related to this.  Prior treatments have been ineffective or unable to tolerate.  She will see her rheumatologist as planned.  She has disability paperwork that we will get filled out for her.  Given her debilitating pain and fatigue I do not think she could return to gainful employment.

## 2019-08-13 NOTE — Assessment & Plan Note (Signed)
Patient with cramping issues.  We will have her come in for a BMP and magnesium check.

## 2019-08-13 NOTE — Telephone Encounter (Signed)
Please call the patient and find out if she is still on tamoxifen. If she is not taking this medication anymore please remove it from her list. If she is taking the tamoxifen we will need to consider a different anti-depressant called venlafaxine. Thanks.

## 2019-08-13 NOTE — Assessment & Plan Note (Addendum)
Worsened issue.  Discussed starting on Cymbalta as this could be beneficial for depression, anxiety, and chronic pain. There is a warning regarding tamoxifen and cymbalta. I will check on this with our clinical pharmacist prior to prescribing. She will follow up as planned in December.

## 2019-08-13 NOTE — Telephone Encounter (Signed)
-----   Message from De Hollingshead, [facility] sent at 08/13/2019  1:48 PM EST ----- Oh yes, this is a real one.   Tamoxifen is converted to the active metabolite, endoxifen, via CYP2D6. Cymbalta (and Prozac) are potent 2D6 inhibitors, so tamoxifen would not be effective.   If patient still on tamoxifen, could consider venlafaxine if wanting SNRI benefits? Does not inhibit 2D6 ----- Message ----- From: [name], MD Sent: 08/13/2019   1:12 PM EST To: De Hollingshead, RPH  I tried to prescribe cymbalta for this patient though the computer gave a warning that it could lower the effectiveness of tamoxifen. I wanted to check and see how significant this was. I am going to have Gae Bon call her to determine if she is still taking the tamoxifen.   Randall Hiss

## 2019-08-13 NOTE — Telephone Encounter (Signed)
I called the patient and she stated taht she is not taking the tamoxifen. I will take it off he list.  Lior Cartelli,cma

## 2019-08-13 NOTE — Telephone Encounter (Addendum)
Noted. I will send the cymbalta in for her to start on. Please let her know I sent this in and please confirm when she stopped the tamoxifen. Thanks.

## 2019-08-13 NOTE — Progress Notes (Signed)
Virtual Visit via telephone Note  This visit type was conducted due to national recommendations for restrictions regarding the COVID-19 pandemic (e.g. social distancing).  This format is felt to be most appropriate for this patient at this time.  All issues noted in this document were discussed and addressed.  No physical exam was performed (except for noted visual exam findings with Video Visits).   I connected with Kristina Mccoy today at 11:00 AM EST by telephone and verified that I am speaking with the correct person using two identifiers. Location patient: home Location provider: work Persons participating in the virtual visit: patient, provider  I discussed the limitations, risks, security and privacy concerns of performing an evaluation and management service by telephone and the availability of in person appointments. I also discussed with the patient that there may be a patient responsible charge related to this service. The patient expressed understanding and agreed to proceed.  Interactive audio and video telecommunications were attempted between this provider and patient, however failed, due to patient having technical difficulties OR patient did not have access to video capability.  We continued and completed visit with audio only.  Reason for visit: Follow-up.  HPI: Autoimmune disorder: Patient has Sjogren's and primary biliary cirrhosis.  She continues to have tremendous pain and fatigue related to the Sjogren's.  She notes she has pain all the time.  Any small task is difficult for her to accomplish.  She has difficulty grocery shopping and preparing a light meal for herself.  Anytime she tries to do any sort of exercise or activity it is quite painful and exhausting and she will have to lay down.  She reports feeling weak in her arms and legs at times.  She has difficulty opening jars and drops things at times.  Her back locks up occasionally.  She notes her joint pain has  worsened.  She sees her rheumatologist on the 20th of this month.  She notes that at most she can sit for 30 to 60 minutes at a time before developing issues with worsening pain.  She can stand for 15 to 30 minutes at a time though if she stands for that duration she typically has increasing pain.  She can walk for short periods of time though has issues developing exhaustion and pain as well.  She does not carry or lift anything other than her groceries due to issues with pain and exhaustion related to that.  She notes only being able to use her arms above her shoulder level for a few minutes at a time.  She can use her arms at desk level for about 30 minutes though then develops discomfort and exhaustion as well and has to get up and move around.  Depression/anxiety: She continues to have issues with depression and anxiety.  She does have tremendous anxiety at times with some panic attacks.  She notes feeling as though she has neurocognitive issues with difficulty concentrating and memory issues as well.  She does continue with therapy and her Xanax.  She is interested in starting additional medication for depression and anxiety.  She denies SI.  Cramping: Patient notes cramping in her calves and feet and hands recently.  Notes there will be a tightening and twisting in her feet at night.  She will feel her fingers lock up with writing.  She is drinking plenty of fluids and is drinking some Gatorade to get electrolytes.   ROS: See pertinent positives and negatives per HPI.  Past Medical History:  Diagnosis Date   Abdominal pain, epigastric    Abnormal chest CT 09/08/2014   Allergic urticaria    Blood clotting disorder (HCC)    Breast cancer ([place]) 5/14   left breast   Breast cancer ([place]) 02/2013   Dr Laurance Flatten ([place])   Diabetes mellitus    Diverticulosis    Fibromyalgia    GERD (gastroesophageal reflux disease)    Heart murmur    Hyperlipidemia    Lymphedema of arm    right     Myalgia    Myositis    Neuromyositis    Otitis media, chronic    Pain syndrome, chronic    Peptic ulcer    Sicca syndrome (HCC)    Sjogren's disease (HCC)    Sleep apnea    SOB (shortness of breath)    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[place])     Past Surgical History:  Procedure Laterality Date   BREAST SURGERY Bilateral    mastectomy   COLONOSCOPY WITH PROPOFOL N/A 04/22/2017   Procedure: COLONOSCOPY WITH PROPOFOL;  Surgeon: Lollie Sails, MD;  Location[facility] ENDOSCOPY;  Service: Endoscopy;  Laterality: N/A;   ESOPHAGOGASTRODUODENOSCOPY (EGD) WITH PROPOFOL N/A 04/22/2017   Procedure: ESOPHAGOGASTRODUODENOSCOPY (EGD) WITH PROPOFOL;  Surgeon: Lollie Sails, MD;  Location: [facility] ENDOSCOPY;  Service: Endoscopy;  Laterality: N/A;   MASTECTOMY Bilateral 03/23/13    Family History  Problem Relation Age of Onset   Diabetes Mother    Heart disease Mother    Hyperlipidemia Mother    Heart failure Mother    Diabetes Other    Heart disease Brother    Hyperlipidemia Brother    Colon polyps Brother    Stroke Maternal Grandmother    Breast cancer Maternal Aunt    Colon cancer Paternal Grandmother    Rheum arthritis Sister    Psoriasis Other        psoriatic arthritis    Ovarian cancer Neg Hx     SOCIAL HX: Former smoker   Current Outpatient Medications:    albuterol (PROVENTIL HFA;VENTOLIN HFA) 108 (90 Base) MCG/ACT inhaler, Inhale 2 puffs into the lungs every 6 (six) hours as needed for wheezing or shortness of breath., Disp: 1 Inhaler, Rfl: 0   ALPRAZolam (XANAX) 0.5 MG tablet, TAKE 1 TABLET (0.5 MG TOTAL) BY MOUTH 4 (FOUR) TIMES DAILY AS NEEDED FOR ANXIETY., Disp: 120 tablet, Rfl: 0   Cholecalciferol (VITAMIN D3) 1000 units CAPS, Take by mouth., Disp: , Rfl:    clobetasol ointment (TEMOVATE) 0.05 %, Apply topically. Vaginally, Disp: , Rfl:    Cyanocobalamin 1000 MCG CAPS, Take by mouth., Disp: , Rfl:    dicyclomine (BENTYL)  10 MG capsule, Take 1 capsule (10 mg total) by mouth 3 (three) times daily as needed for spasms., Disp: 60 capsule, Rfl: 1   Emollient (CERAVE) CREA, Apply topically., Disp: , Rfl:    famotidine (PEPCID) 20 MG tablet, Take 1 tablet (20 mg total) by mouth at bedtime for 14 days., Disp: 14 tablet, Rfl: 0   FREESTYLE LITE test strip, CHECK 6 TIMES DAILY, Disp: 100 each, Rfl: 3   glipiZIDE (GLUCOTROL) 5 MG tablet, Take 5 mg by mouth daily before breakfast., Disp: , Rfl:    glucose blood (ONE TOUCH ULTRA TEST) test strip, CHECK SIX TIMES DAILY, Disp: 100 each, Rfl: 11   indomethacin (INDOCIN) 50 MG capsule, Take 1 capsule (50 mg total) by mouth 3 (three) times daily with meals., Disp: 15 capsule, Rfl: 0  insulin aspart (NOVOLOG FLEXPEN) 100 UNIT/ML FlexPen, Take up to 2 - 4 units before meals up to 3 times daily, Disp: , Rfl:    insulin regular (NOVOLIN R) 100 units/mL injection, Inject 100 Units into the skin 3 (three) times daily before meals., Disp: , Rfl:    Insulin Syringe-Needle U-100 (INSULIN SYRINGE .5CC/31GX5/16") 31G X 5/16" 0.5 ML MISC, Use twice daily with injections, Disp: , Rfl:    ketoconazole (NIZORAL) 2 % cream, Apply 1 application topically 2 (two) times daily., Disp: , Rfl: 3   ketoconazole (NIZORAL) 2 % shampoo, Apply 1 application topically once a week., Disp: , Rfl: 11   Lancets MISC, Use. BAYER CONTOUR LANCETS, Disp: , Rfl:    nystatin cream (MYCOSTATIN), Apply 1 application topically as needed., Disp: , Rfl:    ondansetron (ZOFRAN) 4 MG tablet, TAKE 1 TABLET BY MOUTH EVERY 8 HOURS AS NEEDED FOR NAUSEA AND VOMITING, Disp: 18 tablet, Rfl: 1   ONETOUCH DELICA LANCETS [id number] MISC, TWICE DAILY, Disp: 100 each, Rfl: 3   pantoprazole (PROTONIX) 40 MG tablet, TAKE 1 TABLET BY MOUTH EVERY DAY, Disp: 90 tablet, Rfl: 1   Polyethyl Glycol-Propyl Glycol (SYSTANE OP), Apply to eye., Disp: , Rfl:    pseudoephedrine-guaifenesin (MUCINEX D) 60-600 MG 12 hr tablet, Take 1 tablet by  mouth every 12 (twelve) hours., Disp: , Rfl:    tamoxifen (NOLVADEX) 20 MG tablet, Take 20 mg by mouth daily., Disp: , Rfl:    traMADol (ULTRAM) 50 MG tablet, Take 1 tablet (50 mg total) by mouth every 6 (six) hours as needed. for pain, Disp: 120 tablet, Rfl: 1   TRESIBA FLEXTOUCH 100 UNIT/ML SOPN FlexTouch Pen, 20 Units at bedtime., Disp: , Rfl: 5   triamcinolone (NASACORT ALLERGY 24HR) 55 MCG/ACT AERO nasal inhaler, Place 2 sprays into the nose daily., Disp: , Rfl:   EXAM: This was a telehealth telephone visit and thus no physical exam could be completed.  ASSESSMENT AND PLAN:  Discussed the following assessment and plan:  Anxiety and depression Worsened issue.  Discussed starting on Cymbalta as this could be beneficial for depression, anxiety, and chronic pain. There is a warning regarding tamoxifen and cymbalta. I will check on this with our clinical pharmacist prior to prescribing. She will follow up as planned in December.  Bilateral leg cramps Patient with cramping issues.  We will have her come in for a BMP and magnesium check.  Sjogren's syndrome ([place]) Patient continues to have significant symptoms of fatigue and pain related to this.  Prior treatments have been ineffective or unable to tolerate.  She will see her rheumatologist as planned.  She has disability paperwork that we will get filled out for her.  Given her debilitating pain and fatigue I do not think she could return to gainful employment.     I discussed the assessment and treatment plan with the patient. The patient was provided an opportunity to ask questions and all were answered. The patient agreed with the plan and demonstrated an understanding of the instructions.   The patient was advised to call back or seek an in-person evaluation if the symptoms worsen or if the condition fails to improve as anticipated.  I provided 40 minutes of non-face-to-face time during this encounter.   [name], MD

## 2019-08-13 NOTE — Progress Notes (Signed)
Opened in error

## 2019-08-13 NOTE — Progress Notes (Signed)
lmtcb to ask if she is still taking the medication tamoxifen.  Kristina Mccoy,cma

## 2019-08-16 ENCOUNTER — Other Ambulatory Visit: Payer: Self-pay

## 2019-08-16 NOTE — Telephone Encounter (Signed)
Patient stated she had not taking this medication in 3 month or more .  And I informed her that the Cymbalta was sent to the pharmacy.  Nina,cma

## 2019-08-17 ENCOUNTER — Other Ambulatory Visit (INDEPENDENT_AMBULATORY_CARE_PROVIDER_SITE_OTHER): Payer: PPO

## 2019-08-17 ENCOUNTER — Other Ambulatory Visit: Payer: Self-pay

## 2019-08-17 DIAGNOSIS — R252 Cramp and spasm: Secondary | ICD-10-CM

## 2019-08-17 LAB — BASIC METABOLIC PANEL
BUN: 13 mg/dL (ref 6–23)
CO2: 25 mEq/L (ref 19–32)
Calcium: 9 mg/dL (ref 8.4–10.5)
Chloride: 98 mEq/L (ref 96–112)
Creatinine, Ser: 0.76 mg/dL (ref 0.40–1.20)
GFR: 77.22 mL/min (ref >60.00)
Glucose, Bld: 198 mg/dL — ABNORMAL HIGH (ref 70–99)
Potassium: 4.2 mEq/L (ref 3.5–5.1)
Sodium: 134 mEq/L — ABNORMAL LOW (ref 135–145)

## 2019-08-17 LAB — MAGNESIUM: Magnesium: 1.9 mg/dL (ref 1.5–2.5)

## 2019-08-20 DIAGNOSIS — R52 Pain, unspecified: Secondary | ICD-10-CM | POA: Diagnosis not present

## 2019-08-20 DIAGNOSIS — M3500 Sicca syndrome, unspecified: Secondary | ICD-10-CM | POA: Diagnosis not present

## 2019-08-20 DIAGNOSIS — Z6828 Body mass index (BMI) 28.0-28.9, adult: Secondary | ICD-10-CM | POA: Diagnosis not present

## 2019-08-23 ENCOUNTER — Ambulatory Visit: Payer: PPO | Admitting: Family Medicine

## 2019-08-29 NOTE — Progress Notes (Signed)
Virtual Visit via Telephone Note  I was unable to connect with Kristina Mccoy on 08/30/19 at  1:30 PM EST by telephone and verified that I am speaking with the correct person using two identifiers.   History of Present Illness:  63 year old female former smoker followed in our office for restrictive lung disease, chronic dyspnea on exertion, obstructive lung disease  Past medical history: Sjogren's syndrome, allergic rhinitis, type 2 diabetes, insomnia, anxiety, vitamin D deficiency Smoking history: Former smoker.  Quit 2004.  30-pack-year smoking history Maintenance: None Former patient of Dr. Ashby Dawes  Chief complaint: 1 year follow up / former DR pt  I attempted to contact the patient for separate times.  I left 2 voicemails on her mobile device.  Unable to leave voicemail on home phone number.    Observations/Objective:  02/17/2019-chest x-ray-no acute cardiopulmonary disease  02/13/2018-CT chest lung cancer screening-radiation changes in right lung apex and anterior right upper lobe, lung RADS 1  11/30/2014-pulmonary function test-FVC 2.33 (67% predicted), postbronchodilator ratio 77, postbronchodilator FEV1 1.75 (65% predicted), no bronchodilator response, DLCO 15.45 (61% predicted)  Sleep study February 2016 AHI 14.3  Social History   Tobacco Use  Smoking Status Former Smoker  . Packs/day: 1.00  . Years: 30.00  . Pack years: 30.00  . Types: Cigarettes  . Quit date: 02/28/2013  . Years since quitting: 6.5  Smokeless Tobacco Never Used   Immunization History  Administered Date(s) Administered  . Pneumococcal Polysaccharide-23 10/07/2008      Assessment and Plan:  Chronic fatigue Plan: Continue to follow-up with primary care  Sjogren's syndrome [facility]) Plan: Continue to follow-up with rheumatology  OSA on CPAP Plan: Continue CPAP therapy Follow-up with our office in 1 year in sleep clinic  Former smoker May/2019 CT chest lung cancer screening shows  lung RADS 1  Plan: Referral to lung cancer screening program to reestablish care in screening program   Follow Up Instructions:  Return in about 2 weeks (around 09/13/2019), or if symptoms worsen or fail to improve, for [facility] - Dr. Mortimer Fries.   I discussed the assessment and treatment plan with the patient. The patient was provided an opportunity to ask questions and all were answered. The patient agreed with the plan and demonstrated an understanding of the instructions.   The patient was advised to call back or seek an in-person evaluation if the symptoms worsen or if the condition fails to improve as anticipated.  I provided 0 minutes of non-face-to-face time during this encounter.   Lauraine Rinne, NP

## 2019-08-30 ENCOUNTER — Encounter: Payer: Self-pay | Admitting: Pulmonary Disease

## 2019-08-30 ENCOUNTER — Ambulatory Visit (INDEPENDENT_AMBULATORY_CARE_PROVIDER_SITE_OTHER): Payer: PPO | Admitting: Pulmonary Disease

## 2019-08-30 ENCOUNTER — Other Ambulatory Visit: Payer: Self-pay | Admitting: Family Medicine

## 2019-08-30 DIAGNOSIS — G894 Chronic pain syndrome: Secondary | ICD-10-CM

## 2019-08-30 DIAGNOSIS — G4733 Obstructive sleep apnea (adult) (pediatric): Secondary | ICD-10-CM

## 2019-08-30 DIAGNOSIS — R5382 Chronic fatigue, unspecified: Secondary | ICD-10-CM

## 2019-08-30 DIAGNOSIS — J984 Other disorders of lung: Secondary | ICD-10-CM

## 2019-08-30 DIAGNOSIS — M3500 Sicca syndrome, unspecified: Secondary | ICD-10-CM

## 2019-08-30 DIAGNOSIS — Z9989 Dependence on other enabling machines and devices: Secondary | ICD-10-CM

## 2019-08-30 DIAGNOSIS — Z87891 Personal history of nicotine dependence: Secondary | ICD-10-CM

## 2019-08-30 NOTE — Assessment & Plan Note (Signed)
Plan: Continue to follow-up with primary care

## 2019-08-30 NOTE — Assessment & Plan Note (Signed)
Plan: Continue CPAP therapy Follow-up with our office in 1 year in sleep clinic

## 2019-08-30 NOTE — Patient Instructions (Signed)
You did not answer the phone today for Kristina Rinne, NP  for:   1. OSA on CPAP  We recommend that you continue using your CPAP daily >>>Keep up the hard work using your device >>> Goal should be wearing this for the entire night that you are sleeping, at least 4 to 6 hours  Remember:  . Do not drive or operate heavy machinery if tired or drowsy.  . Please notify the supply company and office if you are unable to use your device regularly due to missing supplies or machine being broken.  . Work on maintaining a healthy weight and following your recommended nutrition plan  . Maintain proper daily exercise and movement  . Maintaining proper use of your device can also help improve management of other chronic illnesses such as: Blood pressure, blood sugars, and weight management.   BiPAP/ CPAP Cleaning:  >>>Clean weekly, with Dawn soap, and bottle brush.  Set up to air dry. >>> Wipe mask out daily with wet wipe or towelette   2. Sjogren's syndrome, with unspecified organ involvement ([place])  3. Former smoker  - Ambulatory Referral for Lung Cancer Scre  4. Chronic pain disorder  5. Chronic fatigue  6. Restrictive lung disease   Please contact our office to reschedule another visit.  We need to get you established with a new pulmonologist in our office.  I will tell the clinical team to get you scheduled for follow-up with our sleep doctor in the sleep clinic as well as a pulmonologist in the office.  We recommend today:  Orders Placed This Encounter  Procedures  . Ambulatory Referral for Lung Cancer Scre    Referral Priority:   Routine    Referral Type:   Consultation    Referral Reason:   Specialty Services Required    Number of Visits Requested:   1   Orders Placed This Encounter  Procedures  . Ambulatory Referral for Lung Cancer Scre   No orders of the defined types were placed in this encounter.   Follow Up:    Return in about 2 weeks (around 09/13/2019), or if  symptoms worsen or fail to improve, for [facility] - Dr. Mortimer Fries.   Please do your part to reduce the spread of COVID-19:      Reduce your risk of any infection  and COVID19 by using the similar precautions used for avoiding the common cold or flu:  Marland Kitchen Wash your hands often with soap and warm water for at least 20 seconds.  If soap and water are not readily available, use an alcohol-based hand sanitizer with at least 60% alcohol.  . If coughing or sneezing, cover your mouth and nose by coughing or sneezing into the elbow areas of your shirt or coat, into a tissue or into your sleeve (not your hands). Langley Gauss A MASK when in public  . Avoid shaking hands with others and consider head nods or verbal greetings only. . Avoid touching your eyes, nose, or mouth with unwashed hands.  . Avoid close contact with people who are sick. . Avoid places or events with large numbers of people in one location, like concerts or sporting events. . If you have some symptoms but not all symptoms, continue to monitor at home and seek medical attention if your symptoms worsen. . If you are having a medical emergency, call 911.   ADDITIONAL HEALTHCARE OPTIONS FOR PATIENTS  [facility] / e-Visit: [email or web address]  [facility]: [facility]: [id number]                   [facility]: [id number]     It is flu season:   >>> Best ways to protect herself from the flu: Receive the yearly flu vaccine, practice good hand hygiene washing with soap and also using hand sanitizer when available, eat a nutritious meals, get adequate rest, hydrate appropriately   Please contact the office if your symptoms worsen or you have concerns that you are not improving.   Thank you for choosing  [facility] for your healthcare, and for allowing [place] to partner with you on your healthcare journey. I am  thankful to be able to provide care to you today.   Wyn Quaker FNP-C

## 2019-08-30 NOTE — Telephone Encounter (Signed)
Refilled: 07/30/2019 Last OV: 08/13/2019 Next OV: 10/18/2019

## 2019-08-30 NOTE — Assessment & Plan Note (Signed)
Plan: Continue to follow-up with rheumatology

## 2019-08-30 NOTE — Assessment & Plan Note (Signed)
May/2019 CT chest lung cancer screening shows lung RADS 1  Plan: Referral to lung cancer screening program to reestablish care in screening program

## 2019-09-01 ENCOUNTER — Encounter: Payer: Self-pay | Admitting: *Deleted

## 2019-09-01 ENCOUNTER — Telehealth: Payer: Self-pay | Admitting: *Deleted

## 2019-09-01 NOTE — Telephone Encounter (Signed)
Left message for patient to notify them that it is time to schedule annual low dose lung cancer screening CT scan. Instructed patient to call back to verify information prior to the scan being scheduled.  

## 2019-09-07 DIAGNOSIS — J328 Other chronic sinusitis: Secondary | ICD-10-CM | POA: Diagnosis not present

## 2019-09-07 DIAGNOSIS — H903 Sensorineural hearing loss, bilateral: Secondary | ICD-10-CM | POA: Diagnosis not present

## 2019-09-07 DIAGNOSIS — L501 Idiopathic urticaria: Secondary | ICD-10-CM | POA: Diagnosis not present

## 2019-09-07 DIAGNOSIS — J342 Deviated nasal septum: Secondary | ICD-10-CM | POA: Diagnosis not present

## 2019-09-08 ENCOUNTER — Ambulatory Visit: Payer: PPO | Admitting: Family Medicine

## 2019-09-08 ENCOUNTER — Encounter: Payer: Self-pay | Admitting: *Deleted

## 2019-09-09 ENCOUNTER — Telehealth: Payer: Self-pay

## 2019-09-09 NOTE — Telephone Encounter (Signed)
Kristina Rinne, NP  Lieutenant Diego, RN; [place], Margie A, CMA  I do believe that she would benefit. She clearly qualifies. I agree with sending the letter. I do believe it would be beneficial to try to attempt to call her 1 or 2 more times and having a documented. May be in a week after the letter has reached her.  I also had difficulty reaching her by phone when we did our televisit. Unfortunately patient still typically answer phone calls that they do not know. Hopefully the MyChart or the letter will be sufficient. I will route this message to [facility] to also make sure that she is following up with the patient as well to let them know that we have been trying to reach her to get her scheduled for this and so the patient can contact you back. Kristina Mccoy is the CMA that is located in our [place] office.  Kristina Quaker FNP    Left message on both home and mobile number on file regarding LCS.

## 2019-09-13 NOTE — Telephone Encounter (Signed)
Called and spoke to pt and relayed message.  Pt stated that she would contact Shawn to schedule.  Nothing further is needed,

## 2019-09-14 ENCOUNTER — Telehealth: Payer: Self-pay | Admitting: *Deleted

## 2019-09-14 NOTE — Telephone Encounter (Signed)
Left message for patient to notify her that it is time to schedule annual low dose lung cancer screening CT scan. Instructed Kristina Mccoy to call back to verify information prior to the scan being scheduled.

## 2019-09-27 ENCOUNTER — Other Ambulatory Visit: Payer: Self-pay | Admitting: Family Medicine

## 2019-09-29 ENCOUNTER — Ambulatory Visit: Payer: PPO | Admitting: Family Medicine

## 2019-10-13 ENCOUNTER — Telehealth: Payer: Self-pay | Admitting: Family Medicine

## 2019-10-13 ENCOUNTER — Ambulatory Visit
Admission: EM | Admit: 2019-10-13 | Discharge: 2019-10-13 | Disposition: A | Discharge status: Home / Self Care | Payer: PPO | Attending: Emergency Medicine | Admitting: Emergency Medicine

## 2019-10-13 ENCOUNTER — Other Ambulatory Visit: Payer: Self-pay

## 2019-10-13 DIAGNOSIS — J0100 Acute maxillary sinusitis, unspecified: Secondary | ICD-10-CM | POA: Diagnosis not present

## 2019-10-13 DIAGNOSIS — R0602 Shortness of breath: Secondary | ICD-10-CM

## 2019-10-13 DIAGNOSIS — Z87891 Personal history of nicotine dependence: Secondary | ICD-10-CM

## 2019-10-13 MED ORDER — ALBUTEROL SULFATE HFA 108 (90 BASE) MCG/ACT IN AERS: 2.0000 | INHALATION_SPRAY | RESPIRATORY_TRACT | 0 refills | Status: DC | PRN

## 2019-10-13 MED ORDER — AMOXICILLIN-POT CLAVULANATE 875-125 MG PO TABS: 1.0000 | ORAL_TABLET | Freq: Two times a day (BID) | ORAL | 0 refills | Status: DC

## 2019-10-13 MED ORDER — FLUCONAZOLE 150 MG PO TABS: 150.0000 mg | ORAL_TABLET | Freq: Every day | ORAL | 0 refills | Status: DC

## 2019-10-13 NOTE — ED Triage Notes (Signed)
Pt presents with headaches, nasal congestion, facial pressure,chills,  and shortness of breath for over a week.  Pt has Hx of Pneumonia.

## 2019-10-13 NOTE — Telephone Encounter (Signed)
Pt has sinus pressure/chills but wants an ok from Dr. Caryl Bis to go get tested for Covid? Please advise.

## 2019-10-13 NOTE — ED Provider Notes (Signed)
Roderic Palau    CSN: [id number] Arrival date & time: 10/13/19  1457      History   Chief Complaint Chief Complaint  Patient presents with  . Shortness of Breath  . Sinus Issues    HPI Kristina Mccoy is a 64 y.o. female.   Patient presents with sinus pressure, sinus headache, nasal congestion, chills, shortness of breath x1 week.  Patient states she is concerned because she has a history of pneumonia.  She denies fever, sore throat, cough, vomiting, diarrhea, rash, or other symptoms.  No treatments attempted at home; patient states she has not attempted use of her albuterol inhaler.  Patient declines COVID test today.  The history is provided by the patient.    Past Medical History:  Diagnosis Date  . Abdominal pain, epigastric   . Abnormal chest CT 09/08/2014  . Allergic urticaria   . Blood clotting disorder ([place])   . Breast cancer ([place]) 5/14   left breast  . Breast cancer ([place]) 02/2013   Dr Laurance Flatten ([facility])  . Diabetes mellitus   . Diverticulosis   . Fibromyalgia   . GERD (gastroesophageal reflux disease)   . Heart murmur   . Hyperlipidemia   . Lymphedema of arm    right  . Myalgia   . Myositis   . Neuromyositis   . Otitis media, chronic   . Pain syndrome, chronic   . Peptic ulcer   . Sicca syndrome ([place])   . Sjogren's disease ([place])   . Sleep apnea   . SOB (shortness of breath)   . Systemic involvement of connective tissue [facility])     Patient Active Problem List   Diagnosis Date Noted  . Former smoker 08/30/2019  . Restrictive lung disease 08/30/2019  . Foot pain 02/16/2019  . Recurrent sinusitis 01/05/2019  . Hair loss 08/31/2018  . Bilateral leg cramps 05/27/2018  . Memory difficulty 04/27/2018  . Iliotibial band syndrome 04/27/2018  . Respiratory illness 04/27/2018  . Allergic rhinitis 01/26/2018  . RUQ pain 10/28/2017  . Obesity (BMI 30.0-34.9) 10/28/2017  . Pain in limb 06/03/2017  . Chest pain 01/17/2017  . Throat fullness  10/15/2016  . 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 Rash 08/08/2016  . Lower abdominal pain 08/08/2016  . Cephalalgia 01/26/2016  . Dysuria 12/20/2015  . Vitamin D deficiency 12/20/2015  . Sjogren's syndrome ([place]) 12/20/2015  . Chronic fatigue 12/04/2015  . Insomnia 10/19/2015  . Chronic bilateral low back pain without sciatica 11/07/2014  . OSA on CPAP 10/19/2014  . Anxiety and depression 08/16/2014  . Allergic urticaria 06/02/2014  . Dyspnea 05/04/2014  . Diabetes type 2, uncontrolled ([place]) 07/06/2013  . Malignant neoplasm of breast (female) ([place]) 02/28/2013  . Blood clotting disorder ([place]) 12/01/2012  . Hyperlipidemia 12/01/2012  . Chronic pain disorder 06/11/2012  . Hearing loss 09/16/2011    Past Surgical History:  Procedure Laterality Date  . BREAST SURGERY Bilateral    mastectomy  . COLONOSCOPY WITH PROPOFOL N/A 04/22/2017   Procedure: COLONOSCOPY WITH PROPOFOL;  Surgeon: Lollie Sails, MD;  Location: [facility] ENDOSCOPY;  Service: Endoscopy;  Laterality: N/A;  . ESOPHAGOGASTRODUODENOSCOPY (EGD) WITH PROPOFOL N/A 04/22/2017   Procedure: ESOPHAGOGASTRODUODENOSCOPY (EGD) WITH PROPOFOL;  Surgeon: Lollie Sails, MD;  Location[facility] ENDOSCOPY;  Service: Endoscopy;  Laterality: N/A;  . MASTECTOMY Bilateral 03/23/13    OB History    Gravida  0   Para  0   Term  0   Preterm  0  AB  0   Living  0     SAB  0   TAB  0   Ectopic  0   Multiple  0   Live Births  0            Home Medications    Prior to Admission medications   Medication Sig Start Date End Date Taking? Authorizing Provider  albuterol (VENTOLIN HFA) 108 (90 Base) MCG/ACT inhaler Inhale 2 puffs into the lungs every 4 (four) hours as needed for wheezing or shortness of breath. 10/13/19   Sharion Balloon, NP  ALPRAZolam Duanne Moron) 0.5 MG tablet TAKE 1 TABLET (0.5 MG TOTAL) BY MOUTH 4 (FOUR) TIMES DAILY AS NEEDED FOR ANXIETY. 09/28/19   [name], MD  amoxicillin-clavulanate  (AUGMENTIN) 875-125 MG tablet Take 1 tablet by mouth every 12 (twelve) hours. 10/13/19   Sharion Balloon, NP  Cholecalciferol (VITAMIN D3) 1000 units CAPS Take by mouth.    [provider]  clobetasol ointment (TEMOVATE) 0.05 % Apply topically. Vaginally    [provider]  Cyanocobalamin 1000 MCG CAPS Take by mouth.    [provider]  dicyclomine (BENTYL) 10 MG capsule Take 1 capsule (10 mg total) by mouth 3 (three) times daily as needed for spasms. 04/23/16   Jackolyn Confer, MD  DULoxetine (CYMBALTA) 30 MG capsule Take 1 capsule (30 mg total) by mouth daily for 7 days, THEN 2 capsules (60 mg total) daily. 08/13/19 02/16/20  [name], MD  Emollient (CERAVE) CREA Apply topically.    [provider]  famotidine (PEPCID) 20 MG tablet Take 1 tablet (20 mg total) by mouth at bedtime for 14 days. 02/01/19 02/15/19  Jodelle Green, FNP  fluconazole (DIFLUCAN) 150 MG tablet Take 1 tablet (150 mg total) by mouth daily. Take one tablet today.  May repeat in 3 days. 10/13/19   Sharion Balloon, NP  FREESTYLE LITE test strip CHECK 6 TIMES DAILY 04/22/16   Jackolyn Confer, MD  glipiZIDE (GLUCOTROL) 5 MG tablet Take 5 mg by mouth daily before breakfast.    [provider]  glucose blood (ONE TOUCH ULTRA TEST) test strip CHECK SIX TIMES DAILY 04/19/16   Jackolyn Confer, MD  indomethacin (INDOCIN) 50 MG capsule Take 1 capsule (50 mg total) by mouth 3 (three) times daily with meals. 02/12/19   [name], MD  insulin aspart (NOVOLOG FLEXPEN) 100 UNIT/ML FlexPen Take up to 2 - 4 units before meals up to 3 times daily 07/08/17   [provider]  insulin regular (NOVOLIN R) 100 units/mL injection Inject 100 Units into the skin 3 (three) times daily before meals.    [provider]  Insulin Syringe-Needle U-100 (INSULIN SYRINGE .5CC/31GX5/16") 31G X 5/16" 0.5 ML MISC Use twice daily with injections 05/27/16   [provider]    ketoconazole (NIZORAL) 2 % cream Apply 1 application topically 2 (two) times daily. 12/25/17   [provider]  ketoconazole (NIZORAL) 2 % shampoo Apply 1 application topically once a week. 12/25/17   [provider]  Lancets MISC Use. BAYER CONTOUR LANCETS    [provider]  nystatin cream (MYCOSTATIN) Apply 1 application topically as needed. 01/06/18   [provider]  ondansetron (ZOFRAN) 4 MG tablet TAKE 1 TABLET BY MOUTH EVERY 8 HOURS AS NEEDED FOR NAUSEA AND VOMITING 08/09/19   [name], MD  [facility] LANCETS [id number] [place] TWICE DAILY 04/19/16   Ronette Deter  A, MD  pantoprazole (PROTONIX) 40 MG tablet TAKE 1 TABLET BY MOUTH EVERY DAY 01/29/19   [name], MD  Polyethyl Glycol-Propyl Glycol (SYSTANE OP) Apply to eye.    [provider]  pseudoephedrine-guaifenesin (MUCINEX D) 60-600 MG 12 hr tablet Take 1 tablet by mouth every 12 (twelve) hours.    [provider]  traMADol (ULTRAM) 50 MG tablet TAKE 1 TABLET (50 MG TOTAL) BY MOUTH EVERY 6 (SIX) HOURS AS NEEDED. FOR PAIN 08/13/19   [name], MD  TRESIBA FLEXTOUCH 100 UNIT/ML SOPN FlexTouch Pen 20 Units at bedtime. 05/15/18   [provider]  triamcinolone (NASACORT ALLERGY 24HR) 55 MCG/ACT AERO nasal inhaler Place 2 sprays into the nose daily.    [provider]    Family History Family History  Problem Relation Age of Onset  . Diabetes Mother   . Heart disease Mother   . Hyperlipidemia Mother   . Heart failure Mother   . Diabetes Other   . Heart disease Brother   . Hyperlipidemia Brother   . Colon polyps Brother   . Stroke Maternal Grandmother   . Breast cancer Maternal Aunt   . Colon cancer Paternal Grandmother   . Rheum arthritis Sister   . Psoriasis Other        psoriatic arthritis   . Ovarian cancer Neg Hx     Social History Social History   Tobacco Use  . Smoking status: Former Smoker    Packs/day: 1.00    Years:  30.00    Pack years: 30.00    Types: Cigarettes    Quit date: 02/28/2013    Years since quitting: 6.6  . Smokeless tobacco: Never Used  Substance Use Topics  . Alcohol use: No    Alcohol/week: 0.0 standard drinks  . Drug use: No     Allergies   Gabapentin, Oxycodone, Carbamazepine, Dulaglutide, Insulin glargine, Levaquin [levofloxacin], Metformin, Methylprednisolone, Morphine, Pregabalin, Venlafaxine, Buprenorphine, Canagliflozin, Escitalopram, and Hydroxychloroquine   Review of Systems Review of Systems  Constitutional: Positive for chills. Negative for fever.  HENT: Positive for congestion, sinus pressure and sinus pain. Negative for ear pain and sore throat.   Eyes: Negative for pain and visual disturbance.  Respiratory: Positive for shortness of breath. Negative for cough.   Cardiovascular: Negative for chest pain and palpitations.  Gastrointestinal: Negative for abdominal pain and vomiting.  Genitourinary: Negative for dysuria and hematuria.  Musculoskeletal: Negative for arthralgias and back pain.  Skin: Negative for color change and rash.  Neurological: Negative for seizures and syncope.  All other systems reviewed and are negative.    Physical Exam Triage Vital Signs ED Triage Vitals [10/13/19 1502]  Enc Vitals Group     BP (!) 148/82     Pulse Rate 83     Resp 16     Temp 98 F (36.7 C)     Temp Source Oral     SpO2 97 %     Weight      Height      Head Circumference      Peak Flow      Pain Score      Pain Loc      Pain Edu?      Excl. in [place]?    No data found.  Updated Vital Signs BP (!) 148/82 (BP Location: Left Arm)   Pulse 83   Temp 98 F (36.7 C) (Oral)   Resp 16   Wt 158 lb 12.8 oz (  72 kg)   SpO2 97%   BMI 28.13 kg/m   Visual Acuity Right Eye Distance:   Left Eye Distance:   Bilateral Distance:    Right Eye Near:   Left Eye Near:    Bilateral Near:     Physical Exam Vitals and nursing note reviewed.  Constitutional:       General: She is not in acute distress.    Appearance: She is well-developed.  HENT:     Head: Normocephalic and atraumatic.     Right Ear: Tympanic membrane normal.     Left Ear: Tympanic membrane normal.     Nose: Nose normal.     Mouth/Throat:     Mouth: Mucous membranes are moist.     Pharynx: Oropharynx is clear.  Eyes:     Conjunctiva/sclera: Conjunctivae normal.  Cardiovascular:     Rate and Rhythm: Normal rate and regular rhythm.     Heart sounds: No murmur.  Pulmonary:     Effort: Pulmonary effort is normal. No respiratory distress.     Breath sounds: Normal breath sounds. No wheezing or rhonchi.  Abdominal:     General: Bowel sounds are normal.     Palpations: Abdomen is soft.     Tenderness: There is no abdominal tenderness. There is no guarding or rebound.  Musculoskeletal:     Cervical back: Neck supple.  Skin:    General: Skin is warm and dry.     Findings: No rash.  Neurological:     General: No focal deficit present.     Mental Status: She is alert and oriented to person, place, and time.     Sensory: No sensory deficit.     Motor: No weakness.     Gait: Gait normal.  Psychiatric:        Mood and Affect: Mood normal.        Behavior: Behavior normal.      UC Treatments / Results  Labs (all labs ordered are listed, but only abnormal results are displayed) Labs Reviewed - No data to display  EKG   Radiology No results found.  Procedures Procedures (including critical care time)  Medications Ordered in UC Medications - No data to display  Initial Impression / Assessment and Plan / UC Course  I have reviewed the triage vital signs and the nursing notes.  Pertinent labs & imaging results that were available during my care of the patient were reviewed by me and considered in my medical decision making (see chart for details).    Sinusitis, shortness of breath.  Patient refuses COVID test today.  Treating with Augmentin and refill on albuterol  inhaler.  Patient refuses prednisone.  Also prescribed Diflucan as patient reports she gets yeast infections easily when taking antibiotics.  Instructed her to follow-up with her PCP in 3 days for recheck.  Instructed her to go to the emergency department if she has acute shortness of breath, chest pain, or other concerning symptoms.  Patient agrees to plan of care.     Final Clinical Impressions(s) / UC Diagnoses   Final diagnoses:  Acute non-recurrent maxillary sinusitis  Shortness of breath     Discharge Instructions     Use the albuterol inhaler as directed.  Take the Augmentin as directed.  Take the Diflucan as directed.    Follow-up with your primary care provider in 3 days for a recheck.    Go to the emergency department if you have acute shortness of breath, chest  pain, or other concerning symptoms.        ED Prescriptions    Medication Sig Dispense Auth. Provider   albuterol (VENTOLIN HFA) 108 (90 Base) MCG/ACT inhaler Inhale 2 puffs into the lungs every 4 (four) hours as needed for wheezing or shortness of breath. 18 g Sharion Balloon, NP   amoxicillin-clavulanate (AUGMENTIN) 875-125 MG tablet Take 1 tablet by mouth every 12 (twelve) hours. 14 tablet Sharion Balloon, NP   fluconazole (DIFLUCAN) 150 MG tablet Take 1 tablet (150 mg total) by mouth daily. Take one tablet today.  May repeat in 3 days. 2 tablet Sharion Balloon, NP     PDMP not reviewed this encounter.   Sharion Balloon, NP 10/13/19 1541

## 2019-10-13 NOTE — Telephone Encounter (Signed)
Patient has symptoms of SOB, chills, muscle aches, H/A X 2 weeks now I informed the provider and he advised the patient needed to be seen in person, There were no openings in office here today or tomorrow,  the provider suggested to the patient to call urgent care first and let them know her symptoms and see if she can be seen, pt understood and agreed to process with urgent care.  Kristina Mccoy,cma

## 2019-10-13 NOTE — Discharge Instructions (Addendum)
Use the albuterol inhaler as directed.  Take the Augmentin as directed.  Take the Diflucan as directed.    Follow-up with your primary care provider in 3 days for a recheck.    Go to the emergency department if you have acute shortness of breath, chest pain, or other concerning symptoms.

## 2019-10-15 ENCOUNTER — Telehealth: Payer: Self-pay | Admitting: *Deleted

## 2019-10-15 DIAGNOSIS — Z87891 Personal history of nicotine dependence: Secondary | ICD-10-CM

## 2019-10-15 NOTE — Telephone Encounter (Signed)
Patient has been notified that annual lung cancer screening low dose CT scan is due currently or will be in near future. Confirmed that patient is within the age range of 55-77, and asymptomatic, (no signs or symptoms of lung cancer). Patient denies illness that would prevent curative treatment for lung cancer if found. Verified smoking history, (former, [place] 2014, 30 pack year). The shared decision making visit was done 02/12/18. Patient is agreeable for CT scan being scheduled.

## 2019-10-18 ENCOUNTER — Other Ambulatory Visit: Payer: Self-pay

## 2019-10-18 ENCOUNTER — Ambulatory Visit (INDEPENDENT_AMBULATORY_CARE_PROVIDER_SITE_OTHER): Payer: PPO | Admitting: Family Medicine

## 2019-10-18 ENCOUNTER — Encounter: Payer: Self-pay | Admitting: Family Medicine

## 2019-10-18 VITALS — Ht 63.5 in | Wt 158.0 lb

## 2019-10-18 DIAGNOSIS — F329 Major depressive disorder, single episode, unspecified: Secondary | ICD-10-CM

## 2019-10-18 DIAGNOSIS — F419 Anxiety disorder, unspecified: Secondary | ICD-10-CM | POA: Diagnosis not present

## 2019-10-18 DIAGNOSIS — Z87898 Personal history of other specified conditions: Secondary | ICD-10-CM

## 2019-10-18 DIAGNOSIS — G894 Chronic pain syndrome: Secondary | ICD-10-CM

## 2019-10-18 DIAGNOSIS — F32A Depression, unspecified: Secondary | ICD-10-CM

## 2019-10-18 DIAGNOSIS — J989 Respiratory disorder, unspecified: Secondary | ICD-10-CM | POA: Diagnosis not present

## 2019-10-18 MED ORDER — DOXYCYCLINE HYCLATE 100 MG PO TABS: 100.0000 mg | ORAL_TABLET | Freq: Two times a day (BID) | ORAL | 0 refills | Status: DC

## 2019-10-18 NOTE — Progress Notes (Signed)
Virtual Visit via telephone note  This visit type was conducted due to national recommendations for restrictions regarding the COVID-19 pandemic (e.g. social distancing).  This format is felt to be most appropriate for this patient at this time.  All issues noted in this document were discussed and addressed.  No physical exam was performed (except for noted visual exam findings with Video Visits).   I connected with Kristina Mccoy today at  1:45 PM EST by telephone and verified that I am speaking with the correct person using two identifiers. Location patient: home Location provider: work  Persons participating in the virtual visit: patient, provider  I discussed the limitations, risks, security and privacy concerns of performing an evaluation and management service by telephone and the availability of in person appointments. I also discussed with the patient that there may be a patient responsible charge related to this service. The patient expressed understanding and agreed to proceed.  Interactive audio and video telecommunications were attempted between this provider and patient, however failed, due to patient having technical difficulties OR patient did not have access to video capability.  We continued and completed visit with audio only.   Reason for visit: Follow-up.  HPI: Sinus congestion: Patient notes this has been going on for least 3 weeks.  She went to urgent care and was prescribed Augmentin and albuterol.  She notes she has 2 more days of Augmentin.  She can still feel inflammation in her sinuses.  Not bothering her quite as bad.  She is still congested and short of breath.  She notes some fatigue.  The inhaler does help a little with relief.  No significant cough.  No postnasal drip.  Feels congested in her chest.  No fevers.  Does have some chills.  She declined Covid testing at that time.  Anxiety: Notes this is stable.  Taking Xanax.  This does help at bedtime.  No drowsiness  or alcohol intake.  She does not think the Cymbalta helped at all.  She is only taking 1 tablet daily.  She is able to fall asleep easily though has trouble staying asleep.  No depression.    Chronic pain: The Cymbalta did not help with that either.  She takes tramadol 4 times a day and typically takes 1 tablet though occasionally will take 2 if the pain is significant.  She does take Tylenol with it.  No drowsiness with the tramadol.   ROS: See pertinent positives and negatives per HPI.  Past Medical History:  Diagnosis Date  . Abdominal pain, epigastric   . Abnormal chest CT 09/08/2014  . Allergic urticaria   . Blood clotting disorder ([place])   . Breast cancer ([place]) 5/14   left breast  . Breast cancer ([place]) 02/2013   Dr Laurance Flatten ([place])  . Diabetes mellitus   . Diverticulosis   . Fibromyalgia   . GERD (gastroesophageal reflux disease)   . Heart murmur   . Hyperlipidemia   . Lymphedema of arm    right  . Myalgia   . Myositis   . Neuromyositis   . Otitis media, chronic   . Pain syndrome, chronic   . Peptic ulcer   . Sicca syndrome ([place])   . Sjogren's disease ([place])   . Sleep apnea   . SOB (shortness of breath)   . Systemic involvement of connective tissue [facility])     Past Surgical History:  Procedure Laterality Date  . BREAST SURGERY Bilateral    mastectomy  .  COLONOSCOPY WITH PROPOFOL N/A 04/22/2017   Procedure: COLONOSCOPY WITH PROPOFOL;  Surgeon: Lollie Sails, MD;  [facility] ENDOSCOPY;  Service: Endoscopy;  Laterality: N/A;  . ESOPHAGOGASTRODUODENOSCOPY (EGD) WITH PROPOFOL N/A 04/22/2017   Procedure: ESOPHAGOGASTRODUODENOSCOPY (EGD) WITH PROPOFOL;  Surgeon: Lollie Sails, MD;  Location: [facility] ENDOSCOPY;  Service: Endoscopy;  Laterality: N/A;  . MASTECTOMY Bilateral 03/23/13    Family History  Problem Relation Age of Onset  . Diabetes Mother   . Heart disease Mother   . Hyperlipidemia Mother   . Heart failure Mother   . Diabetes Other   . Heart  disease Brother   . Hyperlipidemia Brother   . Colon polyps Brother   . Stroke Maternal Grandmother   . Breast cancer Maternal Aunt   . Colon cancer Paternal Grandmother   . Rheum arthritis Sister   . Psoriasis Other        psoriatic arthritis   . Ovarian cancer Neg Hx     SOCIAL HX: Former smoker   Current Outpatient Medications:  .  albuterol (VENTOLIN HFA) 108 (90 Base) MCG/ACT inhaler, Inhale 2 puffs into the lungs every 4 (four) hours as needed for wheezing or shortness of breath., Disp: 18 g, Rfl: 0 .  ALPRAZolam (XANAX) 0.5 MG tablet, TAKE 1 TABLET (0.5 MG TOTAL) BY MOUTH 4 (FOUR) TIMES DAILY AS NEEDED FOR ANXIETY., Disp: 120 tablet, Rfl: 0 .  amoxicillin-clavulanate (AUGMENTIN) 875-125 MG tablet, Take 1 tablet by mouth every 12 (twelve) hours., Disp: 14 tablet, Rfl: 0 .  Cholecalciferol (VITAMIN D3) 1000 units CAPS, Take by mouth., Disp: , Rfl:  .  clobetasol ointment (TEMOVATE) 0.05 %, Apply topically. Vaginally, Disp: , Rfl:  .  Cyanocobalamin 1000 MCG CAPS, Take by mouth., Disp: , Rfl:  .  dicyclomine (BENTYL) 10 MG capsule, Take 1 capsule (10 mg total) by mouth 3 (three) times daily as needed for spasms., Disp: 60 capsule, Rfl: 1 .  DULoxetine (CYMBALTA) 30 MG capsule, Take 1 capsule (30 mg total) by mouth daily for 7 days, THEN 2 capsules (60 mg total) daily., Disp: 180 capsule, Rfl: 1 .  Emollient (CERAVE) CREA, Apply topically., Disp: , Rfl:  .  fluconazole (DIFLUCAN) 150 MG tablet, Take 1 tablet (150 mg total) by mouth daily. Take one tablet today.  May repeat in 3 days., Disp: 2 tablet, Rfl: 0 .  FREESTYLE LITE test strip, CHECK 6 TIMES DAILY, Disp: 100 each, Rfl: 3 .  glipiZIDE (GLUCOTROL) 5 MG tablet, Take 5 mg by mouth daily before breakfast., Disp: , Rfl:  .  glucose blood (ONE TOUCH ULTRA TEST) test strip, CHECK SIX TIMES DAILY, Disp: 100 each, Rfl: 11 .  indomethacin (INDOCIN) 50 MG capsule, Take 1 capsule (50 mg total) by mouth 3 (three) times daily with meals.,  Disp: 15 capsule, Rfl: 0 .  insulin aspart (NOVOLOG FLEXPEN) 100 UNIT/ML FlexPen, Take up to 2 - 4 units before meals up to 3 times daily, Disp: , Rfl:  .  insulin regular (NOVOLIN R) 100 units/mL injection, Inject 100 Units into the skin 3 (three) times daily before meals., Disp: , Rfl:  .  Insulin Syringe-Needle U-100 (INSULIN SYRINGE .5CC/31GX5/16") 31G X 5/16" 0.5 ML MISC, Use twice daily with injections, Disp: , Rfl:  .  ketoconazole (NIZORAL) 2 % cream, Apply 1 application topically 2 (two) times daily., Disp: , Rfl: 3 .  ketoconazole (NIZORAL) 2 % shampoo, Apply 1 application topically once a week., Disp: , Rfl: 11 .  Lancets MISC,  Use. BAYER CONTOUR LANCETS, Disp: , Rfl:  .  nystatin cream (MYCOSTATIN), Apply 1 application topically as needed., Disp: , Rfl:  .  ondansetron (ZOFRAN) 4 MG tablet, TAKE 1 TABLET BY MOUTH EVERY 8 HOURS AS NEEDED FOR NAUSEA AND VOMITING, Disp: 18 tablet, Rfl: 1 .  ONETOUCH DELICA LANCETS 99991111 MISC, TWICE DAILY, Disp: 100 each, Rfl: 3 .  pantoprazole (PROTONIX) 40 MG tablet, TAKE 1 TABLET BY MOUTH EVERY DAY, Disp: 90 tablet, Rfl: 1 .  Polyethyl Glycol-Propyl Glycol (SYSTANE OP), Apply to eye., Disp: , Rfl:  .  pseudoephedrine-guaifenesin (MUCINEX D) 60-600 MG 12 hr tablet, Take 1 tablet by mouth every 12 (twelve) hours., Disp: , Rfl:  .  traMADol (ULTRAM) 50 MG tablet, TAKE 1 TABLET (50 MG TOTAL) BY MOUTH EVERY 6 (SIX) HOURS AS NEEDED. FOR PAIN, Disp: 120 tablet, Rfl: 1 .  TRESIBA FLEXTOUCH 100 UNIT/ML SOPN FlexTouch Pen, 20 Units at bedtime., Disp: , Rfl: 5 .  triamcinolone (NASACORT ALLERGY 24HR) 55 MCG/ACT AERO nasal inhaler, Place 2 sprays into the nose daily., Disp: , Rfl:  .  doxycycline (VIBRA-TABS) 100 MG tablet, Take 1 tablet (100 mg total) by mouth 2 (two) times daily., Disp: 14 tablet, Rfl: 0 .  famotidine (PEPCID) 20 MG tablet, Take 1 tablet (20 mg total) by mouth at bedtime for 14 days., Disp: 14 tablet, Rfl: 0  EXAM: This is a telehealth telephone  visit and thus no physical exam was completed.  ASSESSMENT AND PLAN:  Discussed the following assessment and plan:  Respiratory illness Patient with likely sinusitis though could also have COVID-19 or a pneumonia.  She continues to have some symptoms.  Discussed COVID-19 testing though given that she is 3 weeks into the illness it is likely there could be a false negative test.  Advised that she follow quarantine guidelines and at this point she would have to have 3 days of improving symptoms and 3 days without fever without use of Tylenol or ibuprofen to come off of quarantine.  She will finish her Augmentin.  We will add doxycycline on as that would provide additional coverage if she did have pneumonia.  Discussed she should have somebody else pick this up and drop it at her door.  Discussed that if she is not improving by the end of the week we would consider having her go to urgent care for a chest x-ray.  Discussed reasons to seek medical attention in the emergency room.  Anxiety and depression Relatively stable.  We are going to increase Cymbalta to 60 mg daily as she did not do that previously.  We will see how she is doing at follow-up in 3 months.  Chronic pain disorder Relatively stable.  She will continue tramadol at this time.  Discussed that it would be okay for her to occasionally take 2 tablets of this.  We will increase the Cymbalta to 60 mg to see if that is beneficial.  History of chest pain Patient's insurance no longer covers [place].  She would like a referral to a local cardiologist within [facility] health.  Referral has been placed.   Orders Placed This Encounter  Procedures  . Ambulatory referral to Cardiology    Referral Priority:   Routine    Referral Type:   Consultation    Referral Reason:   Specialty Services Required    Requested Specialty:   Cardiology    Number of Visits Requested:   1    Meds ordered this encounter  Medications  . doxycycline  (VIBRA-TABS) 100 MG tablet    Sig: Take 1 tablet (100 mg total) by mouth 2 (two) times daily.    Dispense:  14 tablet    Refill:  0     I discussed the assessment and treatment plan with the patient. The patient was provided an opportunity to ask questions and all were answered. The patient agreed with the plan and demonstrated an understanding of the instructions.   The patient was advised to call back or seek an in-person evaluation if the symptoms worsen or if the condition fails to improve as anticipated.  I provided 25 minutes of non-face-to-face time during this encounter.   [name], MD

## 2019-10-20 ENCOUNTER — Encounter: Payer: Self-pay | Admitting: Family Medicine

## 2019-10-20 DIAGNOSIS — Z87898 Personal history of other specified conditions: Secondary | ICD-10-CM | POA: Insufficient documentation

## 2019-10-20 NOTE — Assessment & Plan Note (Signed)
Patient's insurance no longer covers [place].  She would like a referral to a local cardiologist within [facility] health.  Referral has been placed.

## 2019-10-20 NOTE — Assessment & Plan Note (Signed)
Patient with likely sinusitis though could also have COVID-19 or a pneumonia.  She continues to have some symptoms.  Discussed COVID-19 testing though given that she is 3 weeks into the illness it is likely there could be a false negative test.  Advised that she follow quarantine guidelines and at this point she would have to have 3 days of improving symptoms and 3 days without fever without use of Tylenol or ibuprofen to come off of quarantine.  She will finish her Augmentin.  We will add doxycycline on as that would provide additional coverage if she did have pneumonia.  Discussed she should have somebody else pick this up and drop it at her door.  Discussed that if she is not improving by the end of the week we would consider having her go to urgent care for a chest x-ray.  Discussed reasons to seek medical attention in the emergency room.

## 2019-10-20 NOTE — Assessment & Plan Note (Signed)
Relatively stable.  She will continue tramadol at this time.  Discussed that it would be okay for her to occasionally take 2 tablets of this.  We will increase the Cymbalta to 60 mg to see if that is beneficial.

## 2019-10-20 NOTE — Assessment & Plan Note (Signed)
Relatively stable.  We are going to increase Cymbalta to 60 mg daily as she did not do that previously.  We will see how she is doing at follow-up in 3 months.

## 2019-10-22 ENCOUNTER — Ambulatory Visit: Payer: PPO | Admitting: Internal Medicine

## 2019-10-26 ENCOUNTER — Ambulatory Visit: Admission: RE | Admit: 2019-10-26 | Payer: PPO | Source: Ambulatory Visit

## 2019-10-28 ENCOUNTER — Other Ambulatory Visit: Payer: Self-pay | Admitting: Family Medicine

## 2019-11-10 ENCOUNTER — Other Ambulatory Visit: Payer: Self-pay

## 2019-11-10 ENCOUNTER — Ambulatory Visit (INDEPENDENT_AMBULATORY_CARE_PROVIDER_SITE_OTHER): Payer: PPO | Admitting: Internal Medicine

## 2019-11-10 ENCOUNTER — Encounter: Payer: Self-pay | Admitting: Internal Medicine

## 2019-11-10 VITALS — BP 112/72 | HR 73 | Ht 64.0 in | Wt 158.5 lb

## 2019-11-10 DIAGNOSIS — E785 Hyperlipidemia, unspecified: Secondary | ICD-10-CM | POA: Diagnosis not present

## 2019-11-10 DIAGNOSIS — R0602 Shortness of breath: Secondary | ICD-10-CM

## 2019-11-10 DIAGNOSIS — M79605 Pain in left leg: Secondary | ICD-10-CM | POA: Diagnosis not present

## 2019-11-10 DIAGNOSIS — M79604 Pain in right leg: Secondary | ICD-10-CM | POA: Diagnosis not present

## 2019-11-10 DIAGNOSIS — R079 Chest pain, unspecified: Secondary | ICD-10-CM

## 2019-11-10 NOTE — Patient Instructions (Signed)
Medication Instructions:  Your physician recommends that you continue on your current medications as directed. Please refer to the Current Medication list given to you today.  *If you need a refill on your cardiac medications before your next appointment, please call your pharmacy*  Lab Work: none If you have labs (blood work) drawn today and your tests are completely normal, you will receive your results only by: Marland Kitchen MyChart Message (if you have MyChart) OR . A paper copy in the mail If you have any lab test that is abnormal or we need to change your treatment, we will call you to review the results.  Testing/Procedures: 1- ECHOCARDIOGRAM - Your physician has requested that you have an echocardiogram. Echocardiography is a painless test that uses sound waves to create images of your heart. It provides your doctor with information about the size and shape of your heart and how well your heart's chambers and valves are working. This procedure takes approximately one hour. There are no restrictions for this procedure.   2- Your physician has requested that you have a lower extremity arterial doppler- During this test, ultrasound is used to evaluate arterial blood flow in the legs. Allow approximately one hour for this exam.   Your physician has requested that you have an ankle brachial index (ABI). During this test an ultrasound and blood pressure cuff are used to evaluate the arteries that supply the arms and legs with blood. Allow thirty minutes for this exam. There are no restrictions or special instructions.    Follow-Up: At [facility], you and your health needs are our priority.  As part of our continuing mission to provide you with exceptional heart care, we have created designated Provider Care Teams.  These Care Teams include your primary Cardiologist (physician) and Advanced Practice Providers (APPs -  Physician Assistants and Nurse Practitioners) who all work together to provide you  with the care you need, when you need it.  Your next appointment:   2 month(s)  The format for your next appointment:   In Person  Provider:    You may see DR Harrell Gave END or one of the following Advanced Practice Providers on your designated Care Team:    Murray Hodgkins, NP  Christell Faith, PA-C  Marrianne Mood, PA-C    Ankle-Brachial Index Test Why am I having this test? The ankle-brachial index (ABI) test is used to diagnose peripheral vascular disease (PVD). PVD is also known as peripheral arterial disease (PAD). PVD is the blocking or hardening of the arteries anywhere within the circulatory system beyond the heart. PVD is caused by:  Cholesterol deposits in your blood vessels (atherosclerosis). This is the most common cause of this condition.  Irritation and swelling (inflammation) in the blood vessels.  Blood clots in the vessels. Cholesterol deposits cause arteries to narrow. Normal delivery of oxygen to your tissues is affected, causing muscle pain and fatigue. This is called claudication. PVD means that there may also be a buildup of cholesterol:  In your heart. This increases the risk of heart attacks.  In your brain. This increases the risk of strokes. What is being tested? The ankle-brachial index test measures the blood flow in your arms and legs. The blood flow will show if blood vessels in your legs have been narrowed by cholesterol deposits. How do I prepare for this test?  Wear loose clothing.  Do not use any tobacco products, including cigarettes, chewing tobacco, or e-cigarettes, for at least 30 minutes before the test.  What happens during the test?  1. You will lie down in a resting position. 2. Your health care provider will use a blood pressure machine and a small ultrasound device (Doppler) to measure the systolic pressures on your upper arms and ankles. Systolic pressure is the pressure inside your arteries when your heart pumps. 3. Systolic  pressure measurements will be taken several times, and at several points, on both the ankle and the arm. 4. Your health care provider will divide the highest systolic pressure of the ankle by the highest systolic pressure of the arm. The result is the ankle-brachial pressure ratio, or ABI. Sometimes this test will be repeated after you have exercised on a treadmill for 5 minutes. You may have leg pain during the exercise portion of the test if you suffer from PVD. If the index number drops after exercise, this may show that PVD is present. How are the results reported? Your test results will be reported as a value that shows the ratio of your ankle pressure to your arm pressure (ABI ratio). Your health care provider will compare your results to normal ranges that were established after testing a large group of people (reference ranges). Reference ranges may vary among labs and hospitals. For this test, a common reference range is:  ABI ratio of 0.9 to 1.3. What do the results mean? An ABI ratio that is below the reference range is considered abnormal and may indicate PVD in the legs. Talk with your health care provider about what your results mean. Questions to ask your health care provider Ask your health care provider, or the department that is doing the test:  When will my results be ready?  How will I get my results?  What are my treatment options?  What other tests do I need?  What are my next steps? Summary  The ankle-brachial index (ABI) test is used to diagnose peripheral vascular disease (PVD). PVD is also known as peripheral arterial disease (PAD).  The ankle-brachial index test measures the blood flow in your arms and legs.  The highest systolic pressure of the ankle is divided by the highest systolic pressure of the arm. The result is the ABI ratio.  An ABI ratio that is below 0.9 is considered abnormal and may indicate PVD in the legs. This information is not intended to  replace advice given to you by your health care provider. Make sure you discuss any questions you have with your health care provider. Document Revised: 07/09/2018 Document Reviewed: 06/10/2017 Elsevier Patient Education  [place].    Echocardiogram An echocardiogram is a procedure that uses painless sound waves (ultrasound) to produce an image of the heart. Images from an echocardiogram can provide important information about:  Signs of coronary artery disease (CAD).  Aneurysm detection. An aneurysm is a weak or damaged part of an artery wall that bulges out from the normal force of blood pumping through the body.  Heart size and shape. Changes in the size or shape of the heart can be associated with certain conditions, including heart failure, aneurysm, and CAD.  Heart muscle function.  Heart valve function.  Signs of a past heart attack.  Fluid buildup around the heart.  Thickening of the heart muscle.  A tumor or infectious growth around the heart valves. Tell a health care provider about:  Any allergies you have.  All medicines you are taking, including vitamins, herbs, eye drops, creams, and over-the-counter medicines.  Any blood disorders you have.  Any surgeries you have had.  Any medical conditions you have.  Whether you are pregnant or may be pregnant. What are the risks? Generally, this is a safe procedure. However, problems may occur, including:  Allergic reaction to dye (contrast) that may be used during the procedure. What happens before the procedure? No specific preparation is needed. You may eat and drink normally. What happens during the procedure?   An IV tube may be inserted into one of your veins.  You may receive contrast through this tube. A contrast is an injection that improves the quality of the pictures from your heart.  A gel will be applied to your chest.  A wand-like tool (transducer) will be moved over your chest. The gel  will help to transmit the sound waves from the transducer.  The sound waves will harmlessly bounce off of your heart to allow the heart images to be captured in real-time motion. The images will be recorded on a computer. The procedure may vary among health care providers and hospitals. What happens after the procedure?  You may return to your normal, everyday life, including diet, activities, and medicines, unless your health care provider tells you not to do that. Summary  An echocardiogram is a procedure that uses painless sound waves (ultrasound) to produce an image of the heart.  Images from an echocardiogram can provide important information about the size and shape of your heart, heart muscle function, heart valve function, and fluid buildup around your heart.  You do not need to do anything to prepare before this procedure. You may eat and drink normally.  After the echocardiogram is completed, you may return to your normal, everyday life, unless your health care provider tells you not to do that. This information is not intended to replace advice given to you by your health care provider. Make sure you discuss any questions you have with your health care provider. Document Revised: 01/07/2019 Document Reviewed: 10/19/2016 Elsevier Patient Education  [place].

## 2019-11-10 NOTE — Progress Notes (Signed)
New Outpatient Visit Date: 11/10/2019  Referring Provider: [name], MD [address] [place],  [place] [address]  Chief Complaint: Chest pain, shortness of breath, and leg pain  HPI:  Kristina Mccoy is a 64 y.o. female who is being seen today for the evaluation of chest pain at the request of Dr. Caryl Bis. She has a history of Sjogren's disease with sicca syndrome, hyperlipidemia, diabetes mellitus, fibromyalgia, neuromyositis, and sleep apnea on CPAP.  She was previously followed by [facility] cardiology for chest pain and shortness of breath but has requested switching practices due to [facility] no longer being covered by her insurance.  She last spoke with Dr. Naida Sleight via virtual visit in 12/2018.  She reported worsening exertional dyspnea and chest tightness, prompting referral for stress MRI though this was never performed due to insurance issues.  She was also seen by Dr. Fletcher Anon in our practice in 2015.  Pharmacologic myocardial perfusion stress test was recommended, though the patient did not wish to proceed.  Today, Kristina Mccoy reports that she has been experiencing worsening chest pain and shortness of breath.  She has had the symptoms off and on for over 10 years and has undergone multiple cardiac work-ups.  She began following at [facility] following hospitalization for pneumonia and heart failure in 2015.  Over the last 6 months, Kristina Mccoy feels like her chest pain and dyspnea have worsened.  Both the intensity and quality of the pain is different than what she has experienced in the past she describes a substernal pressure and tightness with a maximal intensity of 6/10.  Sometimes it occurs at rest, sometimes with exertion.  It is now happening daily.  She typically lies down and will occasionally use her rescue inhaler with mild improvement.  Episodes usually last several hours before abating.  Kristina Mccoy also complains of cramping in her legs, particularly in the right calf.  This is  present both at rest and with ambulation.  It sometimes wakes her up at night.  She is concerned about potential vascular problem though ABIs in 2018 were normal.  She notes some improvement with acetaminophen.  She also noticed a rash on the back of her right calf earlier today.  She denies edema as well as orthopnea and PND.  She notes occasional brief palpitations without associated symptoms.  She has not had any lightheadedness.  --------------------------------------------------------------------------------------------------  Cardiovascular History & Procedures: Cardiovascular Problems:  Chest pain and shortness of breath  Risk Factors:  Hyperlipidemia and diabetes mellitus  Cath/PCI:  R/LHC (03/28/2015): No significant coronary artery disease.  Normal left and right heart filling pressures.  Low normal Fick cardiac output.  CV Surgery:  None  EP Procedures and Devices:  None  Non-Invasive Evaluation(s):  Pharmacologic MPI (12/14/2014): Mild anterior and apical ischemia.  LVEF 54%.  Recent CV Pertinent Labs: Lab Results  Component Value Date   CHOL 215 (H) 04/27/2018   CHOL 113 09/03/2014   HDL 48.00 04/27/2018   HDL 16 (L) 09/03/2014   LDLCALC 144 (H) 04/27/2018   LDLCALC 76 09/03/2014   LDLDIRECT 111.0 04/18/2015   TRIG 118.0 04/27/2018   TRIG 107 09/03/2014   CHOLHDL 4 04/27/2018   INR 1.00 04/22/2017   BNP 3,333 (H) 09/03/2014   K 4.2 08/17/2019   K 3.9 09/03/2014   MG 1.9 08/17/2019   BUN 13 08/17/2019   BUN 11 02/19/2019   BUN 10 09/03/2014   CREATININE 0.76 08/17/2019   CREATININE 0.72 10/02/2018    --------------------------------------------------------------------------------------------------  Past Medical History:  Diagnosis Date  . Abdominal pain, epigastric   . Abnormal chest CT 09/08/2014  . Allergic urticaria   . Blood clotting disorder ([place])   . Breast cancer ([place]) 5/14   left breast  . Breast cancer ([place]) 02/2013   Dr Laurance Flatten  ([facility])  . Diabetes mellitus   . Diverticulosis   . Fibromyalgia   . GERD (gastroesophageal reflux disease)   . Heart murmur   . Hyperlipidemia   . Lymphedema of arm    right  . Myalgia   . Myositis   . Neuromyositis   . Otitis media, chronic   . Pain syndrome, chronic   . Peptic ulcer   . Sicca syndrome ([place])   . Sjogren's disease ([place])   . Sleep apnea   . SOB (shortness of breath)   . Systemic involvement of connective tissue [facility])     Past Surgical History:  Procedure Laterality Date  . BREAST SURGERY Bilateral    mastectomy  . COLONOSCOPY WITH PROPOFOL N/A 04/22/2017   Procedure: COLONOSCOPY WITH PROPOFOL;  Surgeon: Lollie Sails, MD;  Location: [facility] ENDOSCOPY;  Service: Endoscopy;  Laterality: N/A;  . ESOPHAGOGASTRODUODENOSCOPY (EGD) WITH PROPOFOL N/A 04/22/2017   Procedure: ESOPHAGOGASTRODUODENOSCOPY (EGD) WITH PROPOFOL;  Surgeon: Lollie Sails, MD;  Location: [facility] ENDOSCOPY;  Service: Endoscopy;  Laterality: N/A;  . MASTECTOMY Bilateral 03/23/13    Current Meds  Medication Sig  . albuterol (VENTOLIN HFA) 108 (90 Base) MCG/ACT inhaler Inhale 2 puffs into the lungs every 4 (four) hours as needed for wheezing or shortness of breath.  . ALPRAZolam (XANAX) 0.5 MG tablet TAKE 1 TABLET (0.5 MG TOTAL) BY MOUTH 4 (FOUR) TIMES DAILY AS NEEDED FOR ANXIETY.  . Cholecalciferol (VITAMIN D3) 1000 units CAPS Take by mouth.  . clobetasol ointment (TEMOVATE) 0.05 % Apply topically. Vaginally  . Cyanocobalamin 1000 MCG CAPS Take by mouth.  . dicyclomine (BENTYL) 10 MG capsule Take 1 capsule (10 mg total) by mouth 3 (three) times daily as needed for spasms.  . DULoxetine (CYMBALTA) 30 MG capsule Take 1 capsule (30 mg total) by mouth daily for 7 days, THEN 2 capsules (60 mg total) daily.  . Emollient (CERAVE) CREA Apply topically.  . famotidine (PEPCID) 20 MG tablet Take 1 tablet (20 mg total) by mouth at bedtime for 14 days.  . fluconazole (DIFLUCAN) 150 MG tablet Take 1  tablet (150 mg total) by mouth daily. Take one tablet today.  May repeat in 3 days.  Marland Kitchen FREESTYLE LITE test strip CHECK 6 TIMES DAILY  . glucose blood (ONE TOUCH ULTRA TEST) test strip CHECK SIX TIMES DAILY  . insulin aspart (NOVOLOG FLEXPEN) 100 UNIT/ML FlexPen Take up to 2 - 4 units before meals up to 3 times daily  . insulin regular (NOVOLIN R) 100 units/mL injection Inject 100 Units into the skin 3 (three) times daily before meals.  . Insulin Syringe-Needle U-100 (INSULIN SYRINGE .5CC/31GX5/16") 31G X 5/16" 0.5 ML MISC Use twice daily with injections  . ketoconazole (NIZORAL) 2 % cream Apply 1 application topically 2 (two) times daily.  Marland Kitchen ketoconazole (NIZORAL) 2 % shampoo Apply 1 application topically once a week.  . Lancets MISC Use. BAYER CONTOUR LANCETS  . nystatin cream (MYCOSTATIN) Apply 1 application topically as needed.  . ondansetron (ZOFRAN) 4 MG tablet TAKE 1 TABLET BY MOUTH EVERY 8 HOURS AS NEEDED FOR NAUSEA AND VOMITING  . ONETOUCH DELICA LANCETS 99991111 MISC TWICE DAILY  . Polyethyl Glycol-Propyl Glycol (SYSTANE OP)  Apply to eye.  . pseudoephedrine-guaifenesin (MUCINEX D) 60-600 MG 12 hr tablet Take 1 tablet by mouth every 12 (twelve) hours.  . traMADol (ULTRAM) 50 MG tablet TAKE 1 TABLET (50 MG TOTAL) BY MOUTH EVERY 6 (SIX) HOURS AS NEEDED. FOR PAIN  . TRESIBA FLEXTOUCH 100 UNIT/ML SOPN FlexTouch Pen 20 Units at bedtime.  . triamcinolone (NASACORT ALLERGY 24HR) 55 MCG/ACT AERO nasal inhaler Place 2 sprays into the nose daily.    Allergies: Gabapentin, Oxycodone, Carbamazepine, Dulaglutide, Insulin glargine, Levaquin [levofloxacin], Metformin, Methylprednisolone, Morphine, Pregabalin, Venlafaxine, Buprenorphine, Canagliflozin, Escitalopram, and Hydroxychloroquine  Social History   Tobacco Use  . Smoking status: Former Smoker    Packs/day: 1.00    Years: 30.00    Pack years: 30.00    Types: Cigarettes    Quit date: 02/28/2013    Years since quitting: 6.7  . Smokeless tobacco:  Never Used  Substance Use Topics  . Alcohol use: No    Alcohol/week: 0.0 standard drinks  . Drug use: No    Family History  Problem Relation Age of Onset  . Diabetes Mother   . Heart disease Mother   . Hyperlipidemia Mother   . Heart failure Mother   . Diabetes Other   . Heart disease Brother        Aortic valve disease  . Hyperlipidemia Brother   . Colon polyps Brother   . Stroke Maternal Grandmother   . Breast cancer Maternal Aunt   . Colon cancer Paternal Grandmother   . Rheum arthritis Sister   . Psoriasis Other        psoriatic arthritis   . Heart disease Brother 51       Tachycardia  . Ovarian cancer Neg Hx     Review of Systems: A 12-system review of systems was performed and was negative except as noted in the HPI.  --------------------------------------------------------------------------------------------------  Physical Exam: BP 112/72 (BP Location: Left Arm, Patient Position: Sitting, Cuff Size: Normal)   Pulse 73   Ht 5\' 4"  (1.626 m)   Wt 158 lb 8 oz (71.9 kg)   SpO2 98%   BMI 27.21 kg/m   General: NAD. HEENT: No conjunctival pallor or scleral icterus. Facemask in place. Neck: Supple without lymphadenopathy, thyromegaly, JVD, or HJR. No carotid bruit. Lungs: Normal work of breathing. Clear to auscultation bilaterally without wheezes or crackles. Heart: Regular rate and rhythm without murmurs, rubs, or gallops. Non-displaced PMI. Abd: Bowel sounds present. Soft, NT/ND without hepatosplenomegaly Ext: No lower extremity edema. Radial, PT, and DP pulses are 2+ bilaterally Skin: Area of maculopapular rash and excoriation just above the right Achilles tendon. Neuro: CNIII-XII intact. Strength and fine-touch sensation intact in upper and lower extremities bilaterally. Psych: Normal mood and affect.  EKG: Normal sinus rhythm with nonspecific T wave changes, which are more pronounced than on the prior tracing from 01/17/2017.  Lab Results  Component Value  Date   WBC 10.8 02/19/2019   HGB 13.5 02/19/2019   HCT 40.4 02/19/2019   MCV 92 02/19/2019   PLT 278 02/19/2019    Lab Results  Component Value Date   NA 134 (L) 08/17/2019   K 4.2 08/17/2019   CL 98 08/17/2019   CO2 25 08/17/2019   BUN 13 08/17/2019   CREATININE 0.76 08/17/2019   GLUCOSE 198 (H) 08/17/2019   ALT 31 02/19/2019    Lab Results  Component Value Date   CHOL 215 (H) 04/27/2018   HDL 48.00 04/27/2018   LDLCALC 144 (H) 04/27/2018  LDLDIRECT 111.0 04/18/2015   TRIG 118.0 04/27/2018   CHOLHDL 4 04/27/2018    --------------------------------------------------------------------------------------------------  ASSESSMENT AND PLAN: Chest pain and shortness of breath: These have been chronic symptoms for Kristina Mccoy though they seem to have worsened in frequency and intensity over the last few months.  Prior cardiac work-ups, most recently around 2016 with echo, myocardial perfusion stress test, and cardiac catheterization, were unrevealing.  However, given her progressive symptoms, we have agreed to obtain a transthoracic echocardiogram.  If there is no significant structural abnormality, we will then proceed with cardiac CTA.  If cardiomyopathy or focal wall motion abnormality is evident, we would need to proceed with catheterization instead.  In the meantime, we will defer medication changes.  Leg pain: Quality and timing of the pain is most consistent with musculoskeletal etiology, including muscle cramps.  I have encouraged Kristina Mccoy to stay well-hydrated.  Prior ABIs in 2018 were normal.  However, given some exertional component, we have agreed to repeat ABIs to ensure that she has not developed any new PAD.  Hyperlipidemia: LDL elevated at 154 on most recent check in 07/2019.  I recommend proceeding with lifestyle modifications first to help lower LDL, though statin therapy is indicated with history of diabetes.  If we uncover significant ASCVD, I would certainly  advocate for high intensity statin therapy for target LDL less than 70.  Follow-up: Return to clinic in 2 months.  Nelva Bush, MD 11/11/2019 10:32 PM

## 2019-11-11 ENCOUNTER — Encounter: Payer: Self-pay | Admitting: Internal Medicine

## 2019-11-11 ENCOUNTER — Ambulatory Visit (INDEPENDENT_AMBULATORY_CARE_PROVIDER_SITE_OTHER): Payer: PPO | Admitting: Internal Medicine

## 2019-11-11 VITALS — BP 116/70 | HR 79 | Temp 97.9°F | Ht 64.0 in | Wt 158.2 lb

## 2019-11-11 DIAGNOSIS — R0602 Shortness of breath: Secondary | ICD-10-CM

## 2019-11-11 MED ORDER — SPIRIVA RESPIMAT 1.25 MCG/ACT IN AERS: 2.0000 | INHALATION_SPRAY | Freq: Every day | RESPIRATORY_TRACT | 3 refills | Status: DC

## 2019-11-11 NOTE — Patient Instructions (Signed)
START SPIRIVA RESPIMAT 1.25 USE AS DIRECTED  CHECK 6WMT AND CHECK ONO for HYPOXIA

## 2019-11-11 NOTE — Progress Notes (Signed)
[facility]   PFTs and mild restriction, RV 74, DLCO 61. Sleep study February 2016 AHI 14.3, AutoPap 5-15.  **Personally reviewed download data, 30 days as of 01/07/18.  Usage greater than 4 hours is 26/30 days.  Average usage on days used is 5 hours 50 minutes.  Set pressure is 8.  Residual AHI is 1.  This demonstrates very good compliance with excellent control of obstructive sleep apnea. **Sleep study, CPAP titration 04/09/16, CPAP was recommended at a pressure of 8.  **Imaging personally reviewed, 01/16/17; lungs unremarkable. **CBC 12/22/17, absolute eosinophil count 300.    Date: 11/11/2019  MRN# [id number] Kristina Mccoy Feb 10, 1956  CC Follow up OSA Follow up SOB  HPI:   Assessment of OSA Patient uses CPAP 8 cm of water pressure Compliance for 30 days is 90% Compliance for greater than 4 hours is 53% Patient wears a full facemask Patient has a history of significant sinus disease with sinus pressure and sinus issues Patient has a hard time wearing her face mask Patient is to undergo major sinus surgery with ENT at some point  Patient does not have a history of Sjogren's syndrome seems to be stable at this time follows up with [facility]ology  Patient has also has significant cardiac issues which have been stabilized and are evaluated by Dr. And with [place] cardiac care  Patient does have a diagnosis of underlying radiation fibrosis with interstitial lung disease from Sjogren's It seems that her shortness of breath and dyspnea exertion have been progressive over the last several years, especially over the last several months Patient does have intermittent wheezing with some cough nonproductive  No evidence of infection at this time No evidence of lower extremity edema No hemoptysis No signs of acute heart failure     she is willing to undergo 6-minute walk test and overnight pulse oximetry and also is to start a bronchodilator therapy to assess  respiratory symptoms  Patient does have a history of breast cancer status post radiation therapy      PMHX:   Past Medical History:  Diagnosis Date  . Abdominal pain, epigastric   . Abnormal chest CT 09/08/2014  . Allergic urticaria   . Blood clotting disorder ([place])   . Breast cancer ([place]) 5/14   left breast  . Breast cancer ([place]) 02/2013   Dr Laurance Flatten ([facility])  . Diabetes mellitus   . Diverticulosis   . Fibromyalgia   . GERD (gastroesophageal reflux disease)   . Heart murmur   . Hyperlipidemia   . Lymphedema of arm    right  . Myalgia   . Myositis   . Neuromyositis   . Otitis media, chronic   . Pain syndrome, chronic   . Peptic ulcer   . Sicca syndrome ([place])   . Sjogren's disease ([place])   . Sleep apnea   . SOB (shortness of breath)   . 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[place])    Surgical Hx:  Past Surgical History:  Procedure Laterality Date  . BREAST SURGERY Bilateral    mastectomy  . COLONOSCOPY WITH PROPOFOL N/A 04/22/2017   Procedure: COLONOSCOPY WITH PROPOFOL;  Surgeon: Lollie Sails, MD;  Location[facility] ENDOSCOPY;  Service: Endoscopy;  Laterality: N/A;  . ESOPHAGOGASTRODUODENOSCOPY (EGD) WITH PROPOFOL N/A 04/22/2017   Procedure: ESOPHAGOGASTRODUODENOSCOPY (EGD) WITH PROPOFOL;  Surgeon: Lollie Sails, MD;  Location: [facility] ENDOSCOPY;  Service: Endoscopy;  Laterality: N/A;  . MASTECTOMY Bilateral 03/23/13   Family Hx:  Family History  Problem Relation Age of Onset  . Diabetes Mother   . Heart disease Mother   . Hyperlipidemia Mother   . Heart failure Mother   . Diabetes Other   . Heart disease Brother        Aortic valve disease  . Hyperlipidemia Brother   . Colon polyps Brother   . Stroke Maternal Grandmother   . Breast cancer Maternal Aunt   . Colon cancer Paternal Grandmother   . Rheum arthritis Sister   . Psoriasis Other        psoriatic arthritis   . Heart disease Brother 51       Tachycardia  . Ovarian cancer Neg Hx     Social Hx:   Social History   Tobacco Use  . Smoking status: Former Smoker    Packs/day: 1.00    Years: 30.00    Pack years: 30.00    Types: Cigarettes    Quit date: 02/28/2013    Years since quitting: 6.7  . Smokeless tobacco: Never Used  Substance Use Topics  . Alcohol use: No    Alcohol/week: 0.0 standard drinks  . Drug use: No   Medication:    Current Outpatient Medications:  .  albuterol (VENTOLIN HFA) 108 (90 Base) MCG/ACT inhaler, Inhale 2 puffs into the lungs every 4 (four) hours as needed for wheezing or shortness of breath., Disp: 18 g, Rfl: 0 .  ALPRAZolam (XANAX) 0.5 MG tablet, TAKE 1 TABLET (0.5 MG TOTAL) BY MOUTH 4 (FOUR) TIMES DAILY AS NEEDED FOR ANXIETY., Disp: 120 tablet, Rfl: 0 .  Cholecalciferol (VITAMIN D3) 1000 units CAPS, Take by mouth., Disp: , Rfl:  .  clobetasol ointment (TEMOVATE) 0.05 %, Apply topically. Vaginally, Disp: , Rfl:  .  Cyanocobalamin 1000 MCG CAPS, Take by mouth., Disp: , Rfl:  .  dicyclomine (BENTYL) 10 MG capsule, Take 1 capsule (10 mg total) by mouth 3 (three) times daily as needed for spasms., Disp: 60 capsule, Rfl: 1 .  DULoxetine (CYMBALTA) 30 MG capsule, Take 1 capsule (30 mg total) by mouth daily for 7 days, THEN 2 capsules (60 mg total) daily., Disp: 180 capsule, Rfl: 1 .  Emollient (CERAVE) CREA, Apply topically., Disp: , Rfl:  .  famotidine (PEPCID) 20 MG tablet, Take 1 tablet (20 mg total) by mouth at bedtime for 14 days., Disp: 14 tablet, Rfl: 0 .  fluconazole (DIFLUCAN) 150 MG tablet, Take 1 tablet (150 mg total) by mouth daily. Take one tablet today.  May repeat in 3 days., Disp: 2 tablet, Rfl: 0 .  FREESTYLE LITE test strip, CHECK 6 TIMES DAILY, Disp: 100 each, Rfl: 3 .  glucose blood (ONE TOUCH ULTRA TEST) test strip, CHECK SIX TIMES DAILY, Disp: 100 each, Rfl: 11 .  insulin aspart (NOVOLOG FLEXPEN) 100 UNIT/ML FlexPen, Take up to 2 - 4 units before meals up to 3 times daily, Disp: , Rfl:  .  insulin regular (NOVOLIN R) 100  units/mL injection, Inject 100 Units into the skin 3 (three) times daily before meals., Disp: , Rfl:  .  Insulin Syringe-Needle U-100 (INSULIN SYRINGE .5CC/31GX5/16") 31G X 5/16" 0.5 ML MISC, Use twice daily with injections, Disp: , Rfl:  .  ketoconazole (NIZORAL) 2 % cream, Apply 1 application topically 2 (two) times daily., Disp: , Rfl: 3 .  ketoconazole (NIZORAL) 2 % shampoo, Apply 1 application topically once a week., Disp: , Rfl: 11 .  Lancets MISC, Use. BAYER CONTOUR LANCETS, Disp: , Rfl:  .  nystatin cream (  MYCOSTATIN), Apply 1 application topically as needed., Disp: , Rfl:  .  ondansetron (ZOFRAN) 4 MG tablet, TAKE 1 TABLET BY MOUTH EVERY 8 HOURS AS NEEDED FOR NAUSEA AND VOMITING, Disp: 18 tablet, Rfl: 1 .  ONETOUCH DELICA LANCETS 99991111 MISC, TWICE DAILY, Disp: 100 each, Rfl: 3 .  Polyethyl Glycol-Propyl Glycol (SYSTANE OP), Apply to eye., Disp: , Rfl:  .  pseudoephedrine-guaifenesin (MUCINEX D) 60-600 MG 12 hr tablet, Take 1 tablet by mouth every 12 (twelve) hours., Disp: , Rfl:  .  traMADol (ULTRAM) 50 MG tablet, TAKE 1 TABLET (50 MG TOTAL) BY MOUTH EVERY 6 (SIX) HOURS AS NEEDED. FOR PAIN, Disp: 120 tablet, Rfl: 1 .  TRESIBA FLEXTOUCH 100 UNIT/ML SOPN FlexTouch Pen, 20 Units at bedtime., Disp: , Rfl: 5 .  triamcinolone (NASACORT ALLERGY 24HR) 55 MCG/ACT AERO nasal inhaler, Place 2 sprays into the nose daily., Disp: , Rfl:    Allergies:  Gabapentin, Oxycodone, Carbamazepine, Dulaglutide, Insulin glargine, Levaquin [levofloxacin], Metformin, Methylprednisolone, Morphine, Pregabalin, Venlafaxine, Buprenorphine, Canagliflozin, Escitalopram, and Hydroxychloroquine    Review of Systems:  Gen:  Denies  fever, sweats, chills weight loss  HEENT: Denies blurred vision, double vision, ear pain, eye pain, hearing loss, nose bleeds, sore throat Cardiac:  No dizziness, chest pain or heaviness, chest tightness,edema, No JVD Resp:   No cough, -sputum production, +shortness of breath,-wheezing,  -hemoptysis,  Gi: Denies swallowing difficulty, stomach pain, nausea or vomiting, diarrhea, constipation, bowel incontinence Gu:  Denies bladder incontinence, burning urine Ext:   Denies Joint pain, stiffness or swelling Skin: Denies  skin rash, easy bruising or bleeding or hives Endoc:  Denies polyuria, polydipsia , polyphagia or weight change Psych:   Denies depression, insomnia or hallucinations  Other:  All other systems negative   BP 116/70 (BP Location: Left Arm, Cuff Size: Normal)   Pulse 79   Temp 97.9 F (36.6 C) (Temporal)   Ht 5\' 4"  (1.626 m)   Wt 158 lb 3.2 oz (71.8 kg)   SpO2 98%   BMI 27.15 kg/m     Physical Examination:   General Appearance: No distress  Neuro:without focal findings,  speech normal,  HEENT: PERRLA, EOM intact.   Pulmonary: normal breath sounds, No wheezing.  CardiovascularNormal S1,S2.  No m/r/g.   Abdomen: Benign, Soft, non-tender. Renal:  No costovertebral tenderness  GU:  Not performed at this time. Endoc: No evident thyromegaly Skin:   warm, no rashes, no ecchymosis  Extremities: normal, no cyanosis, clubbing. PSYCHIATRIC: Mood, affect within normal limits.   ALL OTHER ROS ARE NEGATIVE   Assessment and Plan:  Chronic shortness of breath and dyspnea exertion progressive over the last several months, Most likely related to restrictive lung disease and body habitus  Patient has a history of Sjogren's syndrome as well as history of radiation fibrosis  At this point albuterol seems to help  Patient would like to start another inhaler so I recommend starting Spiriva Respimat 1.25 to assess her respiratory symptoms No indication for antibiotics or prednisone at this time   OSA Patient uses and benefits from therapy Her AHI is down to 0.5 Patient is compliant 90% 27 out of 30 days She is 53% compliance for greater than 4 hours  Sjogren's syndrome follow-up with [facility] rheumatology  Cardiac disease and CHF Follow-up with cardiology as  scheduled    COVID-19 EDUCATION: The signs and symptoms of COVID-19 were discussed with the patient and how to seek care for testing.  The importance of social distancing was discussed today.  Hand Washing Techniques and avoid touching face was advised.     MEDICATION ADJUSTMENTS/LABS AND TESTS ORDERED: Continue CPAP as prescribed Continue albuterol as needed Start Spiriva Respimat therapy 1.25   CURRENT MEDICATIONS REVIEWED AT LENGTH WITH PATIENT TODAY   Patient satisfied with Plan of action and management. All questions answered  Follow up in 6 months  Kristina Mccoy Patricia Pesa, M.D.  [facility]  Medical Director [place] Director [facility] Cardio-Pulmonary Department

## 2019-11-16 ENCOUNTER — Ambulatory Visit: Payer: PPO

## 2019-11-22 ENCOUNTER — Other Ambulatory Visit: Payer: Self-pay

## 2019-11-22 ENCOUNTER — Ambulatory Visit (INDEPENDENT_AMBULATORY_CARE_PROVIDER_SITE_OTHER): Payer: PPO

## 2019-11-22 DIAGNOSIS — R0600 Dyspnea, unspecified: Secondary | ICD-10-CM | POA: Diagnosis not present

## 2019-11-22 DIAGNOSIS — R0609 Other forms of dyspnea: Secondary | ICD-10-CM

## 2019-11-22 NOTE — Progress Notes (Signed)
SIX MIN WALK 11/22/2019 11/30/2014  Medications no meds taken Glipizide, Advile  Supplimental Oxygen during Test? (L/min) No No  Laps 12 6  Partial Lap (in Meters) 0 24  Baseline BP (sitting) 134/78 134/78  Baseline Heartrate 86 68  Baseline Dyspnea (Borg Scale) 2 3  Baseline Fatigue (Borg Scale) 5 7  Baseline SPO2 100 99  BP (sitting) 142/88 136/80  Heartrate 88 77  Dyspnea (Borg Scale) 4 4  Fatigue (Borg Scale) 5 10  SPO2 99 99  BP (sitting) 138/84 126/82  Heartrate 86 64  SPO2 100 100  Stopped or Paused before Six Minutes Yes No  Other Symptoms at end of Exercise at 4:12 due to being light headed. spo2 100 HR 96. light headniess improved after one minute. -  Interpretation Leg pain;Calf pain -  Distance Completed [id number]  Tech Comments: pt completed test at moderate pace. c/o sob, hip and leg pain. pt stated that this is normal with this amount of exertion. test paused at 4:12 due to being light headed spo2 100 HR 96- test resumed after one minute. Pt states she got lightheaded but not dizzy, walked at moderate pace with SOB

## 2019-11-29 ENCOUNTER — Other Ambulatory Visit: Payer: Self-pay | Admitting: Family Medicine

## 2019-11-29 NOTE — Telephone Encounter (Signed)
Refill request for Xanax, last seen 10-18-19, last filled 10-31-19.  Please advise.

## 2019-12-07 DIAGNOSIS — E1169 Type 2 diabetes mellitus with other specified complication: Secondary | ICD-10-CM | POA: Diagnosis not present

## 2019-12-07 DIAGNOSIS — E1165 Type 2 diabetes mellitus with hyperglycemia: Secondary | ICD-10-CM | POA: Diagnosis not present

## 2019-12-07 DIAGNOSIS — Z794 Long term (current) use of insulin: Secondary | ICD-10-CM | POA: Diagnosis not present

## 2019-12-07 DIAGNOSIS — E785 Hyperlipidemia, unspecified: Secondary | ICD-10-CM | POA: Diagnosis not present

## 2019-12-07 DIAGNOSIS — E559 Vitamin D deficiency, unspecified: Secondary | ICD-10-CM | POA: Diagnosis not present

## 2019-12-17 ENCOUNTER — Telehealth: Payer: Self-pay | Admitting: *Deleted

## 2019-12-17 NOTE — Telephone Encounter (Signed)
error 

## 2019-12-21 ENCOUNTER — Other Ambulatory Visit: Payer: Self-pay

## 2019-12-21 ENCOUNTER — Ambulatory Visit (INDEPENDENT_AMBULATORY_CARE_PROVIDER_SITE_OTHER): Payer: PPO

## 2019-12-21 ENCOUNTER — Ambulatory Visit: Admission: RE | Admit: 2019-12-21 | Payer: PPO | Source: Ambulatory Visit

## 2019-12-21 DIAGNOSIS — R0602 Shortness of breath: Secondary | ICD-10-CM

## 2019-12-21 DIAGNOSIS — M79605 Pain in left leg: Secondary | ICD-10-CM

## 2019-12-21 DIAGNOSIS — R079 Chest pain, unspecified: Secondary | ICD-10-CM | POA: Diagnosis not present

## 2019-12-21 DIAGNOSIS — M79604 Pain in right leg: Secondary | ICD-10-CM | POA: Diagnosis not present

## 2019-12-23 ENCOUNTER — Telehealth: Payer: Self-pay | Admitting: *Deleted

## 2019-12-23 MED ORDER — METOPROLOL SUCCINATE ER 25 MG PO TB24: 12.5000 mg | ORAL_TABLET | Freq: Every day | ORAL | 1 refills | Status: DC

## 2019-12-23 MED ORDER — ASPIRIN EC 81 MG PO TBEC: 81.0000 mg | DELAYED_RELEASE_TABLET | Freq: Every day | ORAL | 3 refills | Status: DC

## 2019-12-23 NOTE — Telephone Encounter (Signed)
Results released to My Chart. Results called to pt. Pt verbalized understanding of results, to start metoprolol succinate 12.5 mg (0.5 tablet) by mouth once a day, take aspirin 81 mg by mouth once a day, and to keep follow up as scheduled.  She will let us know if symptoms worsen in the meantime.  Rx sent to pharmacy.  Med list updated.

## 2019-12-23 NOTE — Telephone Encounter (Signed)
-----   Message from Nelva Bush, MD sent at 12/22/2019  6:59 AM EDT ----- Please let Kristina Mccoy know that her echocardiogram showed mildly reduced contraction of her heart.  It is possible that this is contributing to some of her shortness of breath.  I recommend that we start metoprolol succinate 12.5 mg daily and follow-up as planned next month to discuss further testing options.  I also recommend beginning aspirin 81 mg daily if symptoms worsen in the meantime, she should let us know.

## 2019-12-29 ENCOUNTER — Other Ambulatory Visit: Payer: Self-pay | Admitting: Family Medicine

## 2019-12-29 NOTE — Telephone Encounter (Signed)
Refill request for Xanax, last seen 10-20-19, last filled 11-29-19.  Please advise.

## 2019-12-29 NOTE — Telephone Encounter (Signed)
Refill request for tramadol, last seen 10-20-19, last filled 08-13-19.  Please advise.

## 2019-12-30 ENCOUNTER — Ambulatory Visit (INDEPENDENT_AMBULATORY_CARE_PROVIDER_SITE_OTHER): Payer: PPO

## 2019-12-30 ENCOUNTER — Encounter: Payer: Self-pay | Admitting: Family Medicine

## 2019-12-30 ENCOUNTER — Other Ambulatory Visit: Payer: Self-pay

## 2019-12-30 VITALS — Ht 64.0 in | Wt 147.0 lb

## 2019-12-30 DIAGNOSIS — Z0000 Encounter for general adult medical examination without abnormal findings: Secondary | ICD-10-CM | POA: Diagnosis not present

## 2019-12-30 NOTE — Patient Instructions (Addendum)
  Kristina Mccoy , Thank you for taking time to come for your Medicare Wellness Visit. I appreciate your ongoing commitment to your health goals. Please review the following plan we discussed and let me know if I can assist you in the future.   These are the goals we discussed: Goals      Patient Stated   . Increase physical activity per rheumatology (pt-stated)     As tolerated with low impact exercises and walking.       This is a list of the screening recommended for you and due dates:  Health Maintenance  Topic Date Due  . Tetanus Vaccine  Never done  . Complete foot exam   05/19/2015  . Pap Smear  07/02/2016  . Hemoglobin A1C  10/28/2018  . Urine Protein Check  12/23/2018  . Eye exam for diabetics  06/04/2019  . Flu Shot  04/30/2020  . Colon Cancer Screening  04/23/2027  . Pneumococcal vaccine  Completed  .  Hepatitis C: One time screening is recommended by Center for Disease Control  (CDC) for  adults born from [birth year] through 1963.   Completed  . HIV Screening  Completed  . Mammogram  Discontinued

## 2019-12-30 NOTE — Progress Notes (Addendum)
Subjective:   Kristina Mccoy is a 64 y.o. female who presents for an Initial Medicare Annual Wellness Visit.  Review of Systems    No ROS.  Medicare Wellness Virtual Visit.  Visual/audio telehealth visit, UTA vital signs.   Ht/Wt provided.  See social history for additional risk factors.  See plan section for addendum.  Cardiac Risk Factors include: diabetes mellitus     Objective:    Today's Vitals   12/30/19 1134  Weight: 147 lb (66.7 kg)  Height: 5\' 4"  (1.626 m)   Body mass index is 25.23 kg/m.  Advanced Directives 12/30/2019 01/30/2019 07/07/2017 06/03/2017 04/22/2017 02/16/2016  Does Patient Have a Medical Advance Directive? No No - Yes Yes Yes  Type of Advance Directive - - [place];Living will [place];Living will Living will -  Copy of [place] in Chart? - - - - - No - copy requested  Would patient like information on creating a medical advance directive? No - Patient declined - - - - -    Current Medications (verified) Outpatient Encounter Medications as of 12/30/2019  Medication Sig  . albuterol (VENTOLIN HFA) 108 (90 Base) MCG/ACT inhaler Inhale 2 puffs into the lungs every 4 (four) hours as needed for wheezing or shortness of breath.  . ALPRAZolam (XANAX) 0.5 MG tablet TAKE 1 TABLET (0.5 MG TOTAL) BY MOUTH 4 (FOUR) TIMES DAILY AS NEEDED FOR ANXIETY.  Marland Kitchen aspirin EC 81 MG tablet Take 1 tablet (81 mg total) by mouth daily.  . Cholecalciferol (VITAMIN D3) 1000 units CAPS Take by mouth.  . clobetasol ointment (TEMOVATE) 0.05 % Apply topically. Vaginally  . Cyanocobalamin 1000 MCG CAPS Take by mouth.  . dicyclomine (BENTYL) 10 MG capsule Take 1 capsule (10 mg total) by mouth 3 (three) times daily as needed for spasms.  . DULoxetine (CYMBALTA) 30 MG capsule Take 1 capsule (30 mg total) by mouth daily for 7 days, THEN 2 capsules (60 mg total) daily.  . Emollient (CERAVE) CREA Apply topically.  . fluconazole  (DIFLUCAN) 150 MG tablet Take 1 tablet (150 mg total) by mouth daily. Take one tablet today.  May repeat in 3 days.  Marland Kitchen FREESTYLE LITE test strip CHECK 6 TIMES DAILY  . glucose blood (ONE TOUCH ULTRA TEST) test strip CHECK SIX TIMES DAILY  . insulin aspart (NOVOLOG FLEXPEN) 100 UNIT/ML FlexPen Take up to 2 - 4 units before meals up to 3 times daily  . insulin regular (NOVOLIN R) 100 units/mL injection Inject 100 Units into the skin 3 (three) times daily before meals.  . Insulin Syringe-Needle U-100 (INSULIN SYRINGE .5CC/31GX5/16") 31G X 5/16" 0.5 ML MISC Use twice daily with injections  . ketoconazole (NIZORAL) 2 % cream Apply 1 application topically 2 (two) times daily.  Marland Kitchen ketoconazole (NIZORAL) 2 % shampoo Apply 1 application topically once a week.  . Lancets MISC Use. BAYER CONTOUR LANCETS  . metoprolol succinate (TOPROL XL) 25 MG 24 hr tablet Take 0.5 tablets (12.5 mg total) by mouth daily.  Marland Kitchen nystatin cream (MYCOSTATIN) Apply 1 application topically as needed.  . ondansetron (ZOFRAN) 4 MG tablet TAKE 1 TABLET BY MOUTH EVERY 8 HOURS AS NEEDED FOR NAUSEA AND VOMITING  . ONETOUCH DELICA LANCETS 99991111 MISC TWICE DAILY  . Polyethyl Glycol-Propyl Glycol (SYSTANE OP) Apply to eye.  . pseudoephedrine-guaifenesin (MUCINEX D) 60-600 MG 12 hr tablet Take 1 tablet by mouth every 12 (twelve) hours.  . Tiotropium Bromide Monohydrate (SPIRIVA RESPIMAT) 1.25 MCG/ACT  AERS Inhale 2 puffs into the lungs daily.  . traMADol (ULTRAM) 50 MG tablet TAKE 1 TABLET BY MOUTH EVERY 6 HOURS AS NEEDED. FOR PAIN  . TRESIBA FLEXTOUCH 100 UNIT/ML SOPN FlexTouch Pen 20 Units at bedtime.  . triamcinolone (NASACORT ALLERGY 24HR) 55 MCG/ACT AERO nasal inhaler Place 2 sprays into the nose daily.  . famotidine (PEPCID) 20 MG tablet Take 1 tablet (20 mg total) by mouth at bedtime for 14 days.   No facility-administered encounter medications on file as of 12/30/2019.    Allergies (verified) Gabapentin, Oxycodone, Carbamazepine,  Dulaglutide, Insulin glargine, Levaquin [levofloxacin], Metformin, Methylprednisolone, Morphine, Pregabalin, Venlafaxine, Buprenorphine, Canagliflozin, Escitalopram, and Hydroxychloroquine   History: Past Medical History:  Diagnosis Date  . Abdominal pain, epigastric   . Abnormal chest CT 09/08/2014  . Allergic urticaria   . Blood clotting disorder ([place])   . Breast cancer ([place]) 5/14   left breast  . Breast cancer ([place]) 02/2013   Dr Laurance Flatten ([facility])  . Diabetes mellitus   . Diverticulosis   . Fibromyalgia   . GERD (gastroesophageal reflux disease)   . Heart murmur   . Hyperlipidemia   . Lymphedema of arm    right  . Myalgia   . Myositis   . Neuromyositis   . Otitis media, chronic   . Pain syndrome, chronic   . Peptic ulcer   . Sicca syndrome ([place])   . Sjogren's disease ([place])   . Sleep apnea   . SOB (shortness of breath)   . Systemic involvement of connective tissue [facility])    Past Surgical History:  Procedure Laterality Date  . BREAST SURGERY Bilateral    mastectomy  . COLONOSCOPY WITH PROPOFOL N/A 04/22/2017   Procedure: COLONOSCOPY WITH PROPOFOL;  Surgeon: Lollie Sails, MD;  Location[facility] ENDOSCOPY;  Service: Endoscopy;  Laterality: N/A;  . ESOPHAGOGASTRODUODENOSCOPY (EGD) WITH PROPOFOL N/A 04/22/2017   Procedure: ESOPHAGOGASTRODUODENOSCOPY (EGD) WITH PROPOFOL;  Surgeon: Lollie Sails, MD;  Location: [facility] ENDOSCOPY;  Service: Endoscopy;  Laterality: N/A;  . MASTECTOMY Bilateral 03/23/13   Family History  Problem Relation Age of Onset  . Diabetes Mother   . Heart disease Mother   . Hyperlipidemia Mother   . Heart failure Mother   . Diabetes Other   . Heart disease Brother        Aortic valve disease  . Hyperlipidemia Brother   . Colon polyps Brother   . Stroke Maternal Grandmother   . Breast cancer Maternal Aunt   . Colon cancer Paternal Grandmother   . Rheum arthritis Sister   . Psoriasis Other        psoriatic arthritis   . Heart disease Brother  51       Tachycardia  . Ovarian cancer Neg Hx    Social History   Socioeconomic History  . Marital status: Single    Spouse name: Not on file  . Number of children: Not on file  . Years of education: Not on file  . Highest education level: Not on file  Occupational History  . Occupation: disabled  Tobacco Use  . Smoking status: Former Smoker    Packs/day: 1.00    Years: 30.00    Pack years: 30.00    Types: Cigarettes    Quit date: 02/28/2013    Years since quitting: 6.8  . Smokeless tobacco: Never Used  Substance and Sexual Activity  . Alcohol use: No    Alcohol/week: 0.0 standard drinks  . Drug use: No  . Sexual  activity: Never    Birth control/protection: Post-menopausal  Other Topics Concern  . Not on file  Social History Narrative  . Not on file   Social Determinants of Health   Financial Resource Strain:   . Difficulty of Paying Living Expenses:   Food Insecurity:   . Worried About Charity fundraiser in the Last Year:   . Arboriculturist in the Last Year:   Transportation Needs:   . Film/video editor (Medical):   Marland Kitchen Lack of Transportation (Non-Medical):   Physical Activity:   . Days of Exercise per Week:   . Minutes of Exercise per Session:   Stress:   . Feeling of Stress :   Social Connections:   . Frequency of Communication with Friends and Family:   . Frequency of Social Gatherings with Friends and Family:   . Attends Religious Services:   . Active Member of Clubs or Organizations:   . Attends Archivist Meetings:   Marland Kitchen Marital Status:     Tobacco Counseling Counseling given: Not Answered   Clinical Intake:  Pre-visit preparation completed: Yes        Diabetes: Yes(Followed by Endocrinology)  How often do you need to have someone help you when you read instructions, pamphlets, or other written materials from your doctor or pharmacy?: 3 - Sometimes  Interpreter Needed?: No      Activities of Daily Living In your present  state of health, do you have any difficulty performing the following activities: 12/30/2019  Hearing? Y  Comment Hearing aid L ear  Vision? N  Difficulty concentrating or making decisions? Y  Comment Patient notes she has brain fog, difficulty concentrating and with memory and  Walking or climbing stairs? Y  Comment Chronic pain in lower back and legs  Dressing or bathing? Y  Comment Changed from N to Y per patient request after visit.  Doing errands, shopping? Y  Comment Changed from N to Y per patient request after visit  Preparing Food and eating ? Y  Comment Changed from N to Y after patient visit. Self feeds.  Using the Toilet? N  In the past six months, have you accidently leaked urine? N  Do you have problems with loss of bowel control? N  Managing your Medications? N  Managing your Finances? Y  Comment Family assist  Housekeeping or managing your Housekeeping? Y  Comment Changed from N to Y per patient request after visit  Some recent data might be hidden     Immunizations and Health Maintenance Immunization History  Administered Date(s) Administered  . Pneumococcal Polysaccharide-23 10/07/2008   Health Maintenance Due  Topic Date Due  . TETANUS/TDAP  Never done  . FOOT EXAM  05/19/2015  . PAP SMEAR-Modifier  07/02/2016  . HEMOGLOBIN A1C  10/28/2018  . URINE MICROALBUMIN  12/23/2018  . OPHTHALMOLOGY EXAM  06/04/2019    Patient Care Team: [name], MD as PCP - General (Family Medicine)  Indicate any recent Medical Services you may have received from other than [facility] providers in the past year (date may be approximate).     Assessment:   This is a routine wellness examination for Dolorez Fragale.  Nurse connected with patient 01/04/20 at 11:30 AM EDT by a telephone enabled telemedicine application and verified that I am speaking with the correct person using two identifiers. Patient stated full name and DOB. Patient gave permission to continue with virtual  visit. Patient's location was at home and  Nurse's location was at [place] office.   Patient is alert and oriented x3. Patient notes she has brain fog from time to time making it difficult to focus, comprehend what she reads or concentrate at times; memory loss is a concern for her. Requests to follow up with Neurology for further testing. 6CIT Score 6 points.  Health Maintenance Due: -Tdap vaccine- discussed; to be completed with doctor in visit or local pharmacy.   See completed HM at the end of note.   -Diabetes- followed by Endocrinology.  Eye: Visual acuity not assessed. Virtual visit. Followed by their ophthalmologist. Retinopathy- none reported.  Wears glasses.   Dental: Visits every 3 months.    Hearing: Hearing aids- L ear   Safety:  Patient feels safe at home- yes Patient does have smoke detectors at home- yes Patient does wear sunscreen or protective clothing when in direct sunlight - yes Patient does wear seat belt when in a moving vehicle - yes Patient drives- yes Adequate lighting in walkways free from debris- yes Grab bars and handrails used as appropriate- yes Ambulates with an assistive device- no Cell phone on person when ambulating outside of the home- yes  Social: Alcohol intake - no Smoking history- former  Smokers in home? none Illicit drug use? none  Medication: Taking as directed and without issues.  Pill box in use -yes  Self managed - yes   Covid-19: Precautions and sickness symptoms discussed. Wears mask, social distancing, hand hygiene as appropriate.   Activities of Daily Living Patient denies needing assistance with: household chores, feeding themselves, getting from bed to chair, getting to the toilet, bathing/showering, dressing, managing money, or preparing meals.   Discussed the importance of a healthy diet, water intake and the benefits of aerobic exercise.   Physical activity- Limited due to pain. She notes rheumatologist encourages  low impact exercises and walking as tolerated for goal.  Diet:  Keto Water: good intake Caffeine: 1 cup of coffee, 1-2 iced tea  Other Providers Patient Care Team: [name], MD as PCP - General (Family Medicine)  Hearing/Vision screen  Hearing Screening   125Hz  250Hz  500Hz  1000Hz  2000Hz  3000Hz  4000Hz  6000Hz  8000Hz   Right ear:           Left ear:           Comments: Hearing aid, L ear only  Vision Screening Comments: Wears corrective lenses Visual acuity not assessed, virtual visit.  They have seen their ophthalmologist in the last 12 months.     Dietary issues and exercise activities discussed: Current Exercise Habits: The patient does not participate in regular exercise at present  Goals      Patient Stated   . Increase physical activity per rheumatology (pt-stated)     As tolerated with low impact exercises and walking.      Depression Screen PHQ 2/9 Scores 12/30/2019 10/18/2019 04/16/2019 02/04/2019 08/31/2018 04/23/2017 04/23/2017  PHQ - 2 Score 1 0 0 0 0 0 0  PHQ- 9 Score - - 0 0 - 4 -    Fall Risk Fall Risk  12/30/2019 04/16/2019 02/04/2019 04/19/2016 02/21/2016  Falls in the past year? 0 0 0 No No  Number falls in past yr: - 0 0 - -  Injury with Fall? - 0 0 - -  Follow up Falls evaluation completed;Falls prevention discussed Falls evaluation completed Falls evaluation completed - -    Timed Get Up and Go Performed no, virtual visit  Cognitive Function: MMSE - Mini Mental  State Exam 02/21/2016  Orientation to time 4  Orientation to Place 5  Registration 3  Attention/ Calculation 4  Recall 3  Language- name 2 objects 2  Language- repeat 1  Language- follow 3 step command 3  Language- read & follow direction 1  Write a sentence 1  Copy design 1  Total score 28     6CIT Screen 12/30/2019  What Year? 0 points  What month? 0 points  What time? 0 points  Count back from 20 0 points  Months in reverse 0 points  Repeat phrase 6 points  Total Score 6     Screening Tests Health Maintenance  Topic Date Due  . TETANUS/TDAP  Never done  . FOOT EXAM  05/19/2015  . PAP SMEAR-Modifier  07/02/2016  . HEMOGLOBIN A1C  10/28/2018  . URINE MICROALBUMIN  12/23/2018  . OPHTHALMOLOGY EXAM  06/04/2019  . INFLUENZA VACCINE  04/30/2020  . COLONOSCOPY  04/23/2027  . PNEUMOCOCCAL POLYSACCHARIDE VACCINE AGE 12-64 HIGH RISK  Completed  . Hepatitis C Screening  Completed  . HIV Screening  Completed  . MAMMOGRAM  Discontinued      Plan:   Keep all routine maintenance appointments.   Follow up 01/05/20 @ 1:45 for pain and memory loss.  Patient notes she has brain fog from time to time making it difficult to focus, comprehend what she reads or concentrate at times; memory loss is a concern for her. Memory difficulty is a dx on chart. Requests to follow up with Neurology for further testing. 6CIT score 6 points.   Called patient 01/04/20 to discuss message sent via mychart by patient regarding addendum to this note.  Patient answered, after announcing myself, patient reports she was not doing well and could not talk to me, said goodbye and hung up the phone.   Per patient request, visit on 12/30/19 will to reflect answers from N to Y  requested via mychart.  Additional notes of conversation regarding scams during the initial visit added, see below.   During the initial visit patient stopped me from asking functional status questions and questioned why, I was asking if she needs assistance in those areas(I.e. housekeeping, driving, bathing/dressing etc..). Patient stated, "Who needs to know that information, why are you asking these questions."   I explained again (after announcing the reason for the call upon start), "I was calling from Dr. Ellen Henri office, my role, and our goal is to close HM gaps, and provide resources and any assistance we can for your care, we are hoping to stay on the front of your healthcare so if your health changes and we cannot stop  an issue, it is our hope to slow the issue down from progressing, as a team. Everything we discuss only goes to your doctor for review so you can receive any additional care you may need.  I ask these questions to make sure your doctor knows how you are doing."   Patient noted she was uncomfortable answering these questions because of so many scams and that if she needs help with anything she has people in her home that would support and assist. Patient then thanked me for clarifying and allowed the visit to continue.   From that point functional status questions were answered during the visit as "I'm fine".  There is no selection for "I'm fine", only N or Y. No was selected by nurse regarding the need for assistance in areas requested to now be changed. Patient comments of her  chronic pain throughout the visit however, the Nurse does not assume a need for assistance and records only what is said.   Goal was discussed and continues to be centered around information patient provided during the visit regarding physical activity. Patient notes exercise is limited due to pain and reports rheumatologist encourages low impact exercises and walking as tolerated.  Notes addended to Y for need of assistance with bathing/dressing, running errands alone, food prep, housekeeping, and the need for help understanding written materials from her doctor or pharmacy. Weight is listed as 147lb. N changed to Y per patient request post initial visit.   Medicare Attestation I have personally reviewed: The patient's medical and social history Their use of alcohol, tobacco or illicit drugs Their current medications and supplements The patient's functional ability including ADLs,fall risks, home safety risks, cognitive, and hearing and visual impairment Diet and physical activities Evidence for depression   In addition, I have reviewed and discussed with patient certain preventive protocols, quality metrics, and best  practice recommendations. A written personalized care plan for preventive services as well as general preventive health recommendations were provided to patient.     Varney Biles, LPN   [id number]

## 2020-01-01 NOTE — Progress Notes (Signed)
I have reviewed the above note and agree.  Carliyah Cotterman, M.D.  

## 2020-01-04 ENCOUNTER — Telehealth: Payer: Self-pay

## 2020-01-04 NOTE — Telephone Encounter (Signed)
I called patient to address the mychart message she sent. After announcing myself, she said she was not doing well and could not talk to me, said goodbye and hung up the phone.

## 2020-01-05 ENCOUNTER — Telehealth (INDEPENDENT_AMBULATORY_CARE_PROVIDER_SITE_OTHER): Payer: PPO | Admitting: Family Medicine

## 2020-01-05 ENCOUNTER — Other Ambulatory Visit: Payer: Self-pay

## 2020-01-05 ENCOUNTER — Encounter: Payer: Self-pay | Admitting: Family Medicine

## 2020-01-05 VITALS — Ht 64.0 in | Wt 147.0 lb

## 2020-01-05 DIAGNOSIS — G44229 Chronic tension-type headache, not intractable: Secondary | ICD-10-CM

## 2020-01-05 DIAGNOSIS — F329 Major depressive disorder, single episode, unspecified: Secondary | ICD-10-CM

## 2020-01-05 DIAGNOSIS — G894 Chronic pain syndrome: Secondary | ICD-10-CM | POA: Diagnosis not present

## 2020-01-05 DIAGNOSIS — E1165 Type 2 diabetes mellitus with hyperglycemia: Secondary | ICD-10-CM | POA: Diagnosis not present

## 2020-01-05 DIAGNOSIS — M791 Myalgia, unspecified site: Secondary | ICD-10-CM

## 2020-01-05 DIAGNOSIS — F419 Anxiety disorder, unspecified: Secondary | ICD-10-CM

## 2020-01-05 DIAGNOSIS — M797 Fibromyalgia: Secondary | ICD-10-CM | POA: Diagnosis not present

## 2020-01-05 DIAGNOSIS — M3500 Sicca syndrome, unspecified: Secondary | ICD-10-CM | POA: Diagnosis not present

## 2020-01-05 DIAGNOSIS — M2550 Pain in unspecified joint: Secondary | ICD-10-CM

## 2020-01-05 DIAGNOSIS — F32A Depression, unspecified: Secondary | ICD-10-CM

## 2020-01-05 NOTE — Assessment & Plan Note (Signed)
Check A1c. 

## 2020-01-05 NOTE — Progress Notes (Signed)
Virtual Visit via telephone Note  This visit type was conducted due to national recommendations for restrictions regarding the COVID-19 pandemic (e.g. social distancing).  This format is felt to be most appropriate for this patient at this time.  All issues noted in this document were discussed and addressed.  No physical exam was performed (except for noted visual exam findings with Video Visits).   I connected with Kristina Mccoy today at  1:45 PM EDT by a video enabled telemedicine application or telephone and verified that I am speaking with the correct person using two identifiers. Location patient: home Location provider: work  Persons participating in the virtual visit: patient, provider  I discussed the limitations, risks, security and privacy concerns of performing an evaluation and management service by telephone and the availability of in person appointments. I also discussed with the patient that there may be a patient responsible charge related to this service. The patient expressed understanding and agreed to proceed.  Interactive audio and video telecommunications were attempted between this provider and patient, however failed, due to patient having technical difficulties OR patient did not have access to video capability.  We continued and completed visit with audio only.   Reason for visit: follow-up  HPI: Chronic pain: Patient has chronic pain.  She saw rheumatology and they noted fibromyalgia was likely contributing.  Sjogren's could be contributing as well though does not explain all of her pain.  The Cymbalta has not helped.  She does take tramadol for pain.  She tried exercising and not left her in bed for several days with excruciating pain.  Her chronic symptoms have intensified.  She also notes some balance issues that are more frequent.  Back pain has increased.  She feels weak in her arms and legs overall.  Has to have help to open things.  Memory difficulty: She  has had chronic issues with a sensation of brain fog though her memory has worsened recently.  Difficulty retaining information from things she watches.  Some depression though it is stable.  Cymbalta has helped somewhat with that.  No SI.  Headaches: These have intensified.  They are on the top of her head.  She does have chronic headaches.  Occasional blurry vision.  She has chronic numbness in her toes and fingertips.  No focal weakness though generally feels weak overall.  Shortness of breath: She has had work-up for this.  Echo revealed borderline EF.  They started her on metoprolol though she does not know if this has made a difference.  She has follow-up with cardiology in the near future.   ROS: See pertinent positives and negatives per HPI.  Past Medical History:  Diagnosis Date  . Abdominal pain, epigastric   . Abnormal chest CT 09/08/2014  . Allergic urticaria   . Blood clotting disorder ([place])   . Breast cancer ([place]) 5/14   left breast  . Breast cancer ([place]) 02/2013   Dr Laurance Flatten ([place])  . Diabetes mellitus   . Diverticulosis   . Fibromyalgia   . GERD (gastroesophageal reflux disease)   . Heart murmur   . Hyperlipidemia   . Lymphedema of arm    right  . Myalgia   . Myositis   . Neuromyositis   . Otitis media, chronic   . Pain syndrome, chronic   . Peptic ulcer   . Sicca syndrome ([place])   . Sjogren's disease ([place])   . Sleep apnea   . SOB (shortness of breath)   .  Systemic involvement of connective tissue [facility])     Past Surgical History:  Procedure Laterality Date  . BREAST SURGERY Bilateral    mastectomy  . COLONOSCOPY WITH PROPOFOL N/A 04/22/2017   Procedure: COLONOSCOPY WITH PROPOFOL;  Surgeon: Lollie Sails, MD;  Location: [facility] ENDOSCOPY;  Service: Endoscopy;  Laterality: N/A;  . ESOPHAGOGASTRODUODENOSCOPY (EGD) WITH PROPOFOL N/A 04/22/2017   Procedure: ESOPHAGOGASTRODUODENOSCOPY (EGD) WITH PROPOFOL;  Surgeon: Lollie Sails, MD;  Location: [facility]  ENDOSCOPY;  Service: Endoscopy;  Laterality: N/A;  . MASTECTOMY Bilateral 03/23/13    Family History  Problem Relation Age of Onset  . Diabetes Mother   . Heart disease Mother   . Hyperlipidemia Mother   . Heart failure Mother   . Diabetes Other   . Heart disease Brother        Aortic valve disease  . Hyperlipidemia Brother   . Colon polyps Brother   . Stroke Maternal Grandmother   . Breast cancer Maternal Aunt   . Colon cancer Paternal Grandmother   . Rheum arthritis Sister   . Psoriasis Other        psoriatic arthritis   . Heart disease Brother 51       Tachycardia  . Ovarian cancer Neg Hx     SOCIAL HX: Former smoker   Current Outpatient Medications:  .  albuterol (VENTOLIN HFA) 108 (90 Base) MCG/ACT inhaler, Inhale 2 puffs into the lungs every 4 (four) hours as needed for wheezing or shortness of breath., Disp: 18 g, Rfl: 0 .  ALPRAZolam (XANAX) 0.5 MG tablet, TAKE 1 TABLET (0.5 MG TOTAL) BY MOUTH 4 (FOUR) TIMES DAILY AS NEEDED FOR ANXIETY., Disp: 120 tablet, Rfl: 0 .  aspirin EC 81 MG tablet, Take 1 tablet (81 mg total) by mouth daily., Disp: 90 tablet, Rfl: 3 .  Cholecalciferol (VITAMIN D3) 1000 units CAPS, Take by mouth., Disp: , Rfl:  .  clobetasol ointment (TEMOVATE) 0.05 %, Apply topically. Vaginally, Disp: , Rfl:  .  Cyanocobalamin 1000 MCG CAPS, Take by mouth., Disp: , Rfl:  .  dicyclomine (BENTYL) 10 MG capsule, Take 1 capsule (10 mg total) by mouth 3 (three) times daily as needed for spasms., Disp: 60 capsule, Rfl: 1 .  DULoxetine (CYMBALTA) 30 MG capsule, Take 1 capsule (30 mg total) by mouth daily for 7 days, THEN 2 capsules (60 mg total) daily., Disp: 180 capsule, Rfl: 1 .  Emollient (CERAVE) CREA, Apply topically., Disp: , Rfl:  .  fluconazole (DIFLUCAN) 150 MG tablet, Take 1 tablet (150 mg total) by mouth daily. Take one tablet today.  May repeat in 3 days., Disp: 2 tablet, Rfl: 0 .  FREESTYLE LITE test strip, CHECK 6 TIMES DAILY, Disp: 100 each, Rfl: 3 .   glucose blood (ONE TOUCH ULTRA TEST) test strip, CHECK SIX TIMES DAILY, Disp: 100 each, Rfl: 11 .  insulin aspart (NOVOLOG FLEXPEN) 100 UNIT/ML FlexPen, Take up to 2 - 4 units before meals up to 3 times daily, Disp: , Rfl:  .  insulin regular (NOVOLIN R) 100 units/mL injection, Inject 100 Units into the skin 3 (three) times daily before meals., Disp: , Rfl:  .  Insulin Syringe-Needle U-100 (INSULIN SYRINGE .5CC/31GX5/16") 31G X 5/16" 0.5 ML MISC, Use twice daily with injections, Disp: , Rfl:  .  ketoconazole (NIZORAL) 2 % cream, Apply 1 application topically 2 (two) times daily., Disp: , Rfl: 3 .  ketoconazole (NIZORAL) 2 % shampoo, Apply 1 application topically once a week., Disp: , Rfl:  11 .  Lancets MISC, Use. BAYER CONTOUR LANCETS, Disp: , Rfl:  .  metoprolol succinate (TOPROL XL) 25 MG 24 hr tablet, Take 0.5 tablets (12.5 mg total) by mouth daily., Disp: 45 tablet, Rfl: 1 .  nystatin cream (MYCOSTATIN), Apply 1 application topically as needed., Disp: , Rfl:  .  ondansetron (ZOFRAN) 4 MG tablet, TAKE 1 TABLET BY MOUTH EVERY 8 HOURS AS NEEDED FOR NAUSEA AND VOMITING, Disp: 18 tablet, Rfl: 1 .  ONETOUCH DELICA LANCETS 51V MISC, TWICE DAILY, Disp: 100 each, Rfl: 3 .  Polyethyl Glycol-Propyl Glycol (SYSTANE OP), Apply to eye., Disp: , Rfl:  .  pseudoephedrine-guaifenesin (MUCINEX D) 60-600 MG 12 hr tablet, Take 1 tablet by mouth every 12 (twelve) hours., Disp: , Rfl:  .  Tiotropium Bromide Monohydrate (SPIRIVA RESPIMAT) 1.25 MCG/ACT AERS, Inhale 2 puffs into the lungs daily., Disp: 1 g, Rfl: 3 .  traMADol (ULTRAM) 50 MG tablet, TAKE 1 TABLET BY MOUTH EVERY 6 HOURS AS NEEDED. FOR PAIN, Disp: 120 tablet, Rfl: 1 .  TRESIBA FLEXTOUCH 100 UNIT/ML SOPN FlexTouch Pen, 20 Units at bedtime., Disp: , Rfl: 5 .  triamcinolone (NASACORT ALLERGY 24HR) 55 MCG/ACT AERO nasal inhaler, Place 2 sprays into the nose daily., Disp: , Rfl:  .  famotidine (PEPCID) 20 MG tablet, Take 1 tablet (20 mg total) by mouth at  bedtime for 14 days., Disp: 14 tablet, Rfl: 0  EXAM: This was a telephone visit and thus no physical exam was completed.  ASSESSMENT AND PLAN:  Discussed the following assessment and plan:  Anxiety and depression Depression is stable.  Continues to have anxiety issues.  She will continue Cymbalta.  Cephalalgia Chronic headaches of worsened recently.  Will obtain an MRI brain given history of breast cancer.  Will refer to neurology.  Chronic pain disorder Likely related to a combination of fibromyalgia and Sjogren's.  Cymbalta has not been beneficial.  We will refer to neurology to see if they can provide any guidance on treatment for her fibromyalgia.  We will check labs to evaluate for other causes.  Diabetes type 2, uncontrolled (HCC) Check A1c.  Fibromyalgia Refer to neurology to get their input.   Orders Placed This Encounter  Procedures  . MR Brain Wo Contrast    Standing Status:   Future    Standing Expiration Date:   03/06/2021    Order Specific Question:   What is the patient's sedation requirement?    Answer:   No Sedation    Order Specific Question:   Does the patient have a pacemaker or implanted devices?    Answer:   No    Order Specific Question:   Preferred imaging location?    Answer:   [facility] (table limit-400lbs)    Order Specific Question:   Radiology Contrast Protocol - do NOT remove file path    Answer:   [email or web address]  . Comp Met (CMET)    Standing Status:   Future    Standing Expiration Date:   01/04/2021  . TSH    Standing Status:   Future    Standing Expiration Date:   01/04/2021  . Sedimentation rate    Standing Status:   Future    Standing Expiration Date:   01/04/2021  . C-reactive protein    Standing Status:   Future    Standing Expiration Date:   01/04/2021  . CK (Creatine Kinase)    Standing Status:   Future    Standing Expiration Date:   01/04/2021  .  Aldolase    Standing Status:   Future    Standing  Expiration Date:   01/04/2021  . HgB A1c    Standing Status:   Future    Standing Expiration Date:   01/04/2021  . Urine Microalbumin w/creat. ratio    Standing Status:   Future    Standing Expiration Date:   01/04/2021  . Vitamin D (25 hydroxy)    Standing Status:   Future    Standing Expiration Date:   01/04/2021  . Ambulatory referral to Neurology    Referral Priority:   Routine    Referral Type:   Consultation    Referral Reason:   Specialty Services Required    Requested Specialty:   Neurology    Number of Visits Requested:   1    No orders of the defined types were placed in this encounter.    I discussed the assessment and treatment plan with the patient. The patient was provided an opportunity to ask questions and all were answered. The patient agreed with the plan and demonstrated an understanding of the instructions.   The patient was advised to call back or seek an in-person evaluation if the symptoms worsen or if the condition fails to improve as anticipated.  I provided 24 minutes of non-face-to-face time during this encounter.   [name], MD

## 2020-01-05 NOTE — Assessment & Plan Note (Signed)
Refer to neurology to get their input.

## 2020-01-05 NOTE — Assessment & Plan Note (Addendum)
Likely related to a combination of fibromyalgia and Sjogren's.  Cymbalta has not been beneficial.  We will refer to neurology to see if they can provide any guidance on treatment for her fibromyalgia.  We will check labs to evaluate for other causes.

## 2020-01-05 NOTE — Assessment & Plan Note (Signed)
Chronic headaches of worsened recently.  Will obtain an MRI brain given history of breast cancer.  Will refer to neurology.

## 2020-01-05 NOTE — Assessment & Plan Note (Signed)
Depression is stable.  Continues to have anxiety issues.  She will continue Cymbalta.

## 2020-01-11 DIAGNOSIS — N898 Other specified noninflammatory disorders of vagina: Secondary | ICD-10-CM | POA: Diagnosis not present

## 2020-01-11 DIAGNOSIS — R300 Dysuria: Secondary | ICD-10-CM | POA: Diagnosis not present

## 2020-01-11 DIAGNOSIS — N904 Leukoplakia of vulva: Secondary | ICD-10-CM | POA: Diagnosis not present

## 2020-01-12 ENCOUNTER — Other Ambulatory Visit: Payer: Self-pay

## 2020-01-12 ENCOUNTER — Encounter: Payer: Self-pay | Admitting: Internal Medicine

## 2020-01-12 ENCOUNTER — Ambulatory Visit (INDEPENDENT_AMBULATORY_CARE_PROVIDER_SITE_OTHER): Payer: PPO | Admitting: Internal Medicine

## 2020-01-12 ENCOUNTER — Telehealth: Payer: Self-pay | Admitting: Internal Medicine

## 2020-01-12 VITALS — BP 100/70 | HR 83 | Ht 63.5 in | Wt 151.0 lb

## 2020-01-12 DIAGNOSIS — M79605 Pain in left leg: Secondary | ICD-10-CM | POA: Diagnosis not present

## 2020-01-12 DIAGNOSIS — R079 Chest pain, unspecified: Secondary | ICD-10-CM

## 2020-01-12 DIAGNOSIS — I429 Cardiomyopathy, unspecified: Secondary | ICD-10-CM | POA: Diagnosis not present

## 2020-01-12 DIAGNOSIS — G4733 Obstructive sleep apnea (adult) (pediatric): Secondary | ICD-10-CM

## 2020-01-12 DIAGNOSIS — R002 Palpitations: Secondary | ICD-10-CM | POA: Diagnosis not present

## 2020-01-12 DIAGNOSIS — M79604 Pain in right leg: Secondary | ICD-10-CM | POA: Diagnosis not present

## 2020-01-12 NOTE — Telephone Encounter (Signed)
Called and spoke with the pt  She is needing order sent to adapt for mask, water chamber and tubing  Order sent  Nothing further needed

## 2020-01-12 NOTE — Patient Instructions (Addendum)
Medication Instructions:  Your physician recommends that you continue on your current medications as directed. Please refer to the Current Medication list given to you today.  *If you need a refill on your cardiac medications before your next appointment, please call your pharmacy*   Lab Work: none If you have labs (blood work) drawn today and your tests are completely normal, you will receive your results only by: Marland Kitchen MyChart Message (if you have MyChart) OR . A paper copy in the mail If you have any lab test that is abnormal or we need to change your treatment, we will call you to review the results.   Testing/Procedures: 1- [place] TEST Your physician has requested that you have a lexiscan myoview. For further information please visit [email or web address]. Please follow instruction sheet, as given.  [place]  Your caregiver has ordered a Stress Test with nuclear imaging. The purpose of this test is to evaluate the blood supply to your heart muscle. This procedure is referred to as a "Non-Invasive Stress Test." This is because other than having an IV started in your vein, nothing is inserted or "invades" your body. Cardiac stress tests are done to find areas of poor blood flow to the heart by determining the extent of coronary artery disease (CAD). Some patients exercise on a treadmill, which naturally increases the blood flow to your heart, while others who are  unable to walk on a treadmill due to physical limitations have a pharmacologic/chemical stress agent called Lexiscan . This medicine will mimic walking on a treadmill by temporarily increasing your coronary blood flow.   Please note: these test may take anywhere between 2-4 hours to complete  PLEASE REPORT TO [place] AT THE FIRST DESK WILL DIRECT YOU WHERE TO GO  Date of Procedure:_____________________________________  Arrival Time for  Procedure:______________________________  Instructions regarding medication:   __XX_ : Hold diabetes medication morning of procedure - INSULIN  PLEASE NOTIFY THE OFFICE AT LEAST 24 HOURS IN ADVANCE IF YOU ARE UNABLE TO KEEP YOUR APPOINTMENT.  [phone] AND  PLEASE NOTIFY NUCLEAR MEDICINE AT [facility] AT LEAST 24 HOURS IN ADVANCE IF YOU ARE UNABLE TO KEEP YOUR APPOINTMENT. [phone]  How to prepare for your Myoview test:  1. Do not eat or drink after midnight 2. No caffeine for 24 hours prior to test 3. No smoking 24 hours prior to test. 4. Your medication may be taken with water.  If your doctor stopped a medication because of this test, do not take that medication. 5. Ladies, please do not wear dresses.  Skirts or pants are appropriate. Please wear a short sleeve shirt. 6. No perfume, cologne or lotion. 7. Wear comfortable walking shoes. No heels   2- ZIO MONITOR FOR 14 DAYS - PLACE IN OFFICE AFTER THE LEXISCAN.  Your physician has recommended that you wear a Zio monitor. This monitor is a medical device that records the heart's electrical activity. Doctors most often use these monitors to diagnose arrhythmias. Arrhythmias are problems with the speed or rhythm of the heartbeat. The monitor is a small device applied to your chest. You can wear one while you do your normal daily activities. While wearing this monitor if you have any symptoms to push the button and record what you felt. Once you have worn this monitor for the period of time provider prescribed (Usually 14 days), you will return the monitor device in the postage paid box. Once it is returned they will download  the data collected and provide [place] with a report which the provider will then review and we will call you with those results. Important tips:  1. Avoid showering during the first 24 hours of wearing the monitor. 2. Avoid excessive sweating to help maximize wear time. 3. Do not submerge the device, no hot tubs, and no  swimming pools. 4. Keep any lotions or oils away from the patch. 5. After 24 hours you may shower with the patch on. Take brief showers with your back facing the shower head.  6. Do not remove patch once it has been placed because that will interrupt data and decrease adhesive wear time. 7. Push the button when you have any symptoms and write down what you were feeling. 8. Once you have completed wearing your monitor, remove and place into box which has postage paid and place in your outgoing mailbox.  9. If for some reason you have misplaced your box then call our office and we can provide another box and/or mail it off for you.   Follow-Up: At [facility], you and your health needs are our priority.  As part of our continuing mission to provide you with exceptional heart care, we have created designated Provider Care Teams.  These Care Teams include your primary Cardiologist (physician) and Advanced Practice Providers (APPs -  Physician Assistants and Nurse Practitioners) who all work together to provide you with the care you need, when you need it.  We recommend signing up for the patient portal called "MyChart".  Sign up information is provided on this After Visit Summary.  MyChart is used to connect with patients for Virtual Visits (Telemedicine).  Patients are able to view lab/test results, encounter notes, upcoming appointments, etc.  Non-urgent messages can be sent to your provider as well.   To learn more about what you can do with MyChart, go to [email or web address].    Your next appointment:   2 month(s)  The format for your next appointment:   In Person  Provider:    You may see DR Harrell Gave EN or one of the following Advanced Practice Providers on your designated Care Team:    Murray Hodgkins, NP  Christell Faith, PA-C  Marrianne Mood, PA-C     Cardiac Nuclear Scan A cardiac nuclear scan is a test that measures blood flow to the heart when a person is resting and  when he or she is exercising. The test looks for problems such as:  Not enough blood reaching a portion of the heart.  The heart muscle not working normally. You may need this test if:  You have heart disease.  You have had abnormal lab results.  You have had heart surgery or a balloon procedure to open up blocked arteries (angioplasty).  You have chest pain.  You have shortness of breath. In this test, a radioactive dye (tracer) is injected into your bloodstream. After the tracer has traveled to your heart, an imaging device is used to measure how much of the tracer is absorbed by or distributed to various areas of your heart. This procedure is usually done at a hospital and takes 2-4 hours. Tell a health care provider about:  Any allergies you have.  All medicines you are taking, including vitamins, herbs, eye drops, creams, and over-the-counter medicines.  Any problems you or family members have had with anesthetic medicines.  Any blood disorders you have.  Any surgeries you have had.  Any medical conditions you have.  Whether you  are pregnant or may be pregnant. What are the risks? Generally, this is a safe procedure. However, problems may occur, including:  Serious chest pain and heart attack. This is only a risk if the stress portion of the test is done.  Rapid heartbeat.  Sensation of warmth in your chest. This usually passes quickly.  Allergic reaction to the tracer. What happens before the procedure?  Ask your health care provider about changing or stopping your regular medicines. This is especially important if you are taking diabetes medicines or blood thinners.  Follow instructions from your health care provider about eating or drinking restrictions.  Remove your jewelry on the day of the procedure. What happens during the procedure?  An IV will be inserted into one of your veins.  Your health care provider will inject a small amount of radioactive  tracer through the IV.  You will wait for 20-40 minutes while the tracer travels through your bloodstream.  Your heart activity will be monitored with an electrocardiogram (ECG).  You will lie down on an exam table.  Images of your heart will be taken for about 15-20 minutes.  You may also have a stress test. For this test, one of the following may be done: ? You will exercise on a treadmill or stationary bike. While you exercise, your heart's activity will be monitored with an ECG, and your blood pressure will be checked. ? You will be given medicines that will increase blood flow to parts of your heart. This is done if you are unable to exercise.  When blood flow to your heart has peaked, a tracer will again be injected through the IV.  After 20-40 minutes, you will get back on the exam table and have more images taken of your heart.  Depending on the type of tracer used, scans may need to be repeated 3-4 hours later.  Your IV line will be removed when the procedure is over. The procedure may vary among health care providers and hospitals. What happens after the procedure?  Unless your health care provider tells you otherwise, you may return to your normal schedule, including diet, activities, and medicines.  Unless your health care provider tells you otherwise, you may increase your fluid intake. This will help to flush the contrast dye from your body. Drink enough fluid to keep your urine pale yellow.  Ask your health care provider, or the department that is doing the test: ? When will my results be ready? ? How will I get my results? Summary  A cardiac nuclear scan measures the blood flow to the heart when a person is resting and when he or she is exercising.  Tell your health care provider if you are pregnant.  Before the procedure, ask your health care provider about changing or stopping your regular medicines. This is especially important if you are taking diabetes  medicines or blood thinners.  After the procedure, unless your health care provider tells you otherwise, increase your fluid intake. This will help flush the contrast dye from your body.  After the procedure, unless your health care provider tells you otherwise, you may return to your normal schedule, including diet, activities, and medicines. This information is not intended to replace advice given to you by your health care provider. Make sure you discuss any questions you have with your health care provider. Document Revised: 03/02/2018 Document Reviewed: 03/02/2018 Elsevier Patient Education  [place].

## 2020-01-12 NOTE — Progress Notes (Signed)
Follow-up Outpatient Visit Date: 01/12/2020  Primary Care Provider: [name], MD [address] [place] [address]  Chief Complaint: Follow-up chest pain and shortness of breath  HPI:  Kristina Mccoy is a 64 y.o. female with history of Sjogren's disease with sicca syndrome, hyperlipidemia, diabetes mellitus, fibromyalgia, neuromyositis, and sleep apnea on CPAP, who presents for follow-up of chest pain and shortness of breath.  I met her in early February for evaluation of worsening chest pain and shortness of breath.  Symptoms have been present for over 10 years resulting in multiple work-ups.  Echocardiogram last month was notable for mildly reduced left ventricular contraction (EF 45-50%).  We agreed to begin metoprolol succinate 12.5 mg daily and follow-up today to reassess her symptoms and discuss further testing options.  Today, Kristina Mccoy reports that she is feeling a little bit better with improvement in her dyspnea, chest pain, and palpitations.  She still notices her heart race on an almost daily basis, usually lasting about 15 minutes.  She notes mild intermittent dizziness, which predates addition of metoprolol.  She also has some confusion and balance problems, for which she will be seeing neurology.  She reports generalized fatigue with walking.  --------------------------------------------------------------------------------------------------  Cardiovascular History & Procedures: Cardiovascular Problems:  Chest pain and shortness of breath  Risk Factors:  Hyperlipidemia and diabetes mellitus  Cath/PCI:  R/LHC (03/28/2015): No significant coronary artery disease.  Normal left and right heart filling pressures.  Low normal Fick cardiac output.  CV Surgery:  None  EP Procedures and Devices:  None  Non-Invasive Evaluation(s):  TTE (12/21/2019): Normal LV size with mild LVH.  LVEF 45-50% with global hypokinesis and normal diastolic function.   Normal RV size and function.  Trivial mitral regurgitation.  Normal CVP.  ABIs (12/21/2019): Normal.  Pharmacologic MPI (12/14/2014): Mild anterior and apical ischemia.  LVEF 54%.  Recent CV Pertinent Labs: Lab Results  Component Value Date   CHOL 215 (H) 04/27/2018   CHOL 113 09/03/2014   HDL 48.00 04/27/2018   HDL 16 (L) 09/03/2014   LDLCALC 144 (H) 04/27/2018   LDLCALC 76 09/03/2014   LDLDIRECT 111.0 04/18/2015   TRIG 118.0 04/27/2018   TRIG 107 09/03/2014   CHOLHDL 4 04/27/2018   INR 1.00 04/22/2017   BNP 3,333 (H) 09/03/2014   K 4.2 08/17/2019   K 3.9 09/03/2014   MG 1.9 08/17/2019   BUN 13 08/17/2019   BUN 11 02/19/2019   BUN 10 09/03/2014   CREATININE 0.76 08/17/2019   CREATININE 0.72 10/02/2018    Past medical and surgical history were reviewed and updated in EPIC.  Current Meds  Medication Sig  . albuterol (VENTOLIN HFA) 108 (90 Base) MCG/ACT inhaler Inhale 2 puffs into the lungs every 4 (four) hours as needed for wheezing or shortness of breath.  . ALPRAZolam (XANAX) 0.5 MG tablet TAKE 1 TABLET (0.5 MG TOTAL) BY MOUTH 4 (FOUR) TIMES DAILY AS NEEDED FOR ANXIETY.  Marland Kitchen aspirin EC 81 MG tablet Take 1 tablet (81 mg total) by mouth daily.  . Cholecalciferol (VITAMIN D3) 1000 units CAPS Take by mouth daily.   . clobetasol ointment (TEMOVATE) 0.05 % Apply topically. Vaginally  . Cyanocobalamin 1000 MCG CAPS Take by mouth. Takes occassionally  . dicyclomine (BENTYL) 10 MG capsule Take 1 capsule (10 mg total) by mouth 3 (three) times daily as needed for spasms.  . DULoxetine (CYMBALTA) 30 MG capsule Take 1 capsule (30 mg total) by mouth daily for 7 days,  THEN 2 capsules (60 mg total) daily.  . Emollient (CERAVE) CREA Apply topically daily.   . famotidine (PEPCID) 20 MG tablet Take 1 tablet (20 mg total) by mouth at bedtime for 14 days.  Marland Kitchen FREESTYLE LITE test strip CHECK 6 TIMES DAILY  . glucose blood (ONE TOUCH ULTRA TEST) test strip CHECK SIX TIMES DAILY  . insulin aspart  (NOVOLOG FLEXPEN) 100 UNIT/ML FlexPen Take up to 2 - 4 units before meals up to 3 times daily  . insulin regular (NOVOLIN R) 100 units/mL injection Inject 100 Units into the skin 3 (three) times daily before meals.  . Insulin Syringe-Needle U-100 (INSULIN SYRINGE .5CC/31GX5/16") 31G X 5/16" 0.5 ML MISC Use twice daily with injections  . ketoconazole (NIZORAL) 2 % cream Apply 1 application topically 2 (two) times daily.  Marland Kitchen ketoconazole (NIZORAL) 2 % shampoo Apply 1 application topically once a week.  . Lancets MISC Use. BAYER CONTOUR LANCETS  . metoprolol succinate (TOPROL XL) 25 MG 24 hr tablet Take 0.5 tablets (12.5 mg total) by mouth daily.  Marland Kitchen nystatin cream (MYCOSTATIN) Apply 1 application topically as needed.  . ondansetron (ZOFRAN) 4 MG tablet TAKE 1 TABLET BY MOUTH EVERY 8 HOURS AS NEEDED FOR NAUSEA AND VOMITING  . ONETOUCH DELICA LANCETS 72Z MISC TWICE DAILY  . Polyethyl Glycol-Propyl Glycol (SYSTANE OP) Apply to eye.  . pseudoephedrine-guaifenesin (MUCINEX D) 60-600 MG 12 hr tablet Take 1 tablet by mouth 2 (two) times daily as needed.   . Tiotropium Bromide Monohydrate (SPIRIVA RESPIMAT) 1.25 MCG/ACT AERS Inhale 2 puffs into the lungs daily.  . traMADol (ULTRAM) 50 MG tablet TAKE 1 TABLET BY MOUTH EVERY 6 HOURS AS NEEDED. FOR PAIN  . TRESIBA FLEXTOUCH 100 UNIT/ML SOPN FlexTouch Pen 20 Units at bedtime.  . triamcinolone (NASACORT ALLERGY 24HR) 55 MCG/ACT AERO nasal inhaler Place 2 sprays into the nose daily.    Allergies: Gabapentin, Oxycodone, Carbamazepine, Dulaglutide, Insulin glargine, Levaquin [levofloxacin], Metformin, Methylprednisolone, Morphine, Pregabalin, Venlafaxine, Buprenorphine, Canagliflozin, Escitalopram, and Hydroxychloroquine  Social History   Tobacco Use  . Smoking status: Former Smoker    Packs/day: 1.00    Years: 30.00    Pack years: 30.00    Types: Cigarettes    Quit date: 02/28/2013    Years since quitting: 6.8  . Smokeless tobacco: Never Used  Substance  Use Topics  . Alcohol use: No    Alcohol/week: 0.0 standard drinks  . Drug use: No    Family History  Problem Relation Age of Onset  . Diabetes Mother   . Heart disease Mother   . Hyperlipidemia Mother   . Heart failure Mother   . Diabetes Other   . Heart disease Brother        Aortic valve disease  . Hyperlipidemia Brother   . Colon polyps Brother   . Stroke Maternal Grandmother   . Breast cancer Maternal Aunt   . Colon cancer Paternal Grandmother   . Rheum arthritis Sister   . Psoriasis Other        psoriatic arthritis   . Heart disease Brother 51       Tachycardia  . Heart disease Brother   . Ovarian cancer Neg Hx     Review of Systems: A 12-system review of systems was performed and was negative except as noted in the HPI.  --------------------------------------------------------------------------------------------------  Physical Exam: BP 100/70 (BP Location: Left Arm, Patient Position: Sitting, Cuff Size: Normal)   Pulse 83   Ht 5' 3.5" (1.613 m)  Wt 151 lb (68.5 kg)   SpO2 98%   BMI 26.33 kg/m   General: NAD. Neck: No JVD or HJR. Lungs: Clear to auscultation bilaterally without wheezes or crackles. Heart: Regular rate and rhythm without murmurs, rubs, or gallops. Abdomen: Soft, nontender, nondistended. Extremities: No lower extremity edema.  EKG: Normal sinus rhythm with nonspecific T wave changes.  Sniffing change from prior tracing on 11/10/2019.  Lab Results  Component Value Date   WBC 10.8 02/19/2019   HGB 13.5 02/19/2019   HCT 40.4 02/19/2019   MCV 92 02/19/2019   PLT 278 02/19/2019    Lab Results  Component Value Date   NA 134 (L) 08/17/2019   K 4.2 08/17/2019   CL 98 08/17/2019   CO2 25 08/17/2019   BUN 13 08/17/2019   CREATININE 0.76 08/17/2019   GLUCOSE 198 (H) 08/17/2019   ALT 31 02/19/2019    Lab Results  Component Value Date   CHOL 215 (H) 04/27/2018   HDL 48.00 04/27/2018   LDLCALC 144 (H) 04/27/2018   LDLDIRECT 111.0  04/18/2015   TRIG 118.0 04/27/2018   CHOLHDL 4 04/27/2018    --------------------------------------------------------------------------------------------------  ASSESSMENT AND PLAN: Cardiomyopathy, chest pain, and shortness of breath: Recent echo showed mild reduced LVEF with global hypokinesis.  She has undergone prior stress testing that showed a mid and apical anterior defect.  Subsequent catheterization was normal.  We discussed further ischemia evaluation options to exclude significant CAD contributing to her symptoms and cardiomyopathy.  We are reluctant to proceed directly with cardiac catheterization.  Due to borderline low blood pressure with heart rates in the 80s, I worry about the feasibility of achieving adequate heart rate control with beta-blockade to facilitate cardiac CTA.  We have agreed to proceed with pharmacologic myocardial perfusion stress test.  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  It is possible that her cardiomyopathy is related to prior chemotherapy.  For now, we will continue with metoprolol succinate 12.5 mg daily, which has helped Kristina Mccoy symptoms somewhat.  Soft blood pressure precludes addition of an ACE inhibitor or ARB today.  Kristina Mccoy does not appear significantly volume overloaded.  Palpitations: We will obtain a 14-day event monitor for further evaluation after completion of the aforementioned myocardial perfusion stress test.  Continue low-dose metoprolol.  Leg pain: Symptoms are stable with recent ABIs showing no evidence of occlusive PAD.  Pain is most likely musculoskeletal.  Continue conservative management and follow-up with PCP.  Follow-up: Return to clinic in 2 months.  Nelva Bush, MD 01/12/2020 1:34 PM

## 2020-01-13 ENCOUNTER — Encounter: Payer: Self-pay | Admitting: Internal Medicine

## 2020-01-13 DIAGNOSIS — I428 Other cardiomyopathies: Secondary | ICD-10-CM | POA: Insufficient documentation

## 2020-01-13 DIAGNOSIS — I429 Cardiomyopathy, unspecified: Secondary | ICD-10-CM | POA: Insufficient documentation

## 2020-01-19 DIAGNOSIS — G4733 Obstructive sleep apnea (adult) (pediatric): Secondary | ICD-10-CM | POA: Diagnosis not present

## 2020-01-25 ENCOUNTER — Other Ambulatory Visit: Payer: Self-pay

## 2020-01-25 ENCOUNTER — Encounter: Payer: Self-pay | Admitting: Family Medicine

## 2020-01-25 ENCOUNTER — Ambulatory Visit
Admission: RE | Admit: 2020-01-25 | Discharge: 2020-01-25 | Disposition: A | Discharge status: Home / Self Care | Payer: PPO | Source: Ambulatory Visit | Attending: Family Medicine | Admitting: Family Medicine

## 2020-01-25 DIAGNOSIS — R519 Headache, unspecified: Secondary | ICD-10-CM | POA: Diagnosis not present

## 2020-01-25 DIAGNOSIS — G44229 Chronic tension-type headache, not intractable: Secondary | ICD-10-CM

## 2020-01-26 ENCOUNTER — Ambulatory Visit: Payer: PPO

## 2020-01-27 DIAGNOSIS — H04123 Dry eye syndrome of bilateral lacrimal glands: Secondary | ICD-10-CM | POA: Diagnosis not present

## 2020-01-28 ENCOUNTER — Other Ambulatory Visit: Payer: Self-pay | Admitting: Family Medicine

## 2020-01-28 ENCOUNTER — Other Ambulatory Visit: Payer: Self-pay

## 2020-01-28 ENCOUNTER — Ambulatory Visit (INDEPENDENT_AMBULATORY_CARE_PROVIDER_SITE_OTHER): Payer: PPO

## 2020-01-28 DIAGNOSIS — R002 Palpitations: Secondary | ICD-10-CM | POA: Diagnosis not present

## 2020-01-28 NOTE — Telephone Encounter (Signed)
Refill request for xanax, last seen 01-05-20, last filled 12-29-19.  Please advise.

## 2020-01-31 ENCOUNTER — Telehealth: Payer: Self-pay | Admitting: Family Medicine

## 2020-01-31 NOTE — Telephone Encounter (Signed)
Form from patient put in sign basket.  Kristina Mccoy,cma

## 2020-01-31 NOTE — Telephone Encounter (Signed)
Pt dropped off ins form .Form up front in color folder

## 2020-02-01 NOTE — Telephone Encounter (Signed)
Awaiting mychart response from patient and then I will complete the form for her.

## 2020-02-02 ENCOUNTER — Telehealth: Payer: Self-pay

## 2020-02-02 NOTE — Telephone Encounter (Signed)
-----   Message from [name], MD sent at 01/27/2020  9:44 AM EDT ----- Please let the patient know that her MRI brain did not reveal a cause for her increased headaches.  It did reveal some nonspecific findings that could represent numerous things.  I would like to refer her to neurology for evaluation of her headaches and these findings.  I can place that referral when she speak with her.

## 2020-02-03 ENCOUNTER — Telehealth: Payer: Self-pay | Admitting: Family Medicine

## 2020-02-03 ENCOUNTER — Other Ambulatory Visit: Payer: Self-pay | Admitting: Family Medicine

## 2020-02-03 DIAGNOSIS — R930 Abnormal findings on diagnostic imaging of skull and head, not elsewhere classified: Secondary | ICD-10-CM

## 2020-02-03 DIAGNOSIS — R519 Headache, unspecified: Secondary | ICD-10-CM

## 2020-02-03 DIAGNOSIS — Z0279 Encounter for issue of other medical certificate: Secondary | ICD-10-CM

## 2020-02-03 NOTE — Telephone Encounter (Signed)
Pt was returning call needed to speak with you on results

## 2020-02-04 ENCOUNTER — Telehealth: Payer: Self-pay

## 2020-02-04 NOTE — Telephone Encounter (Signed)
I called and informed the patient that her form was up front and ready for pickup. Hennie Gosa,cma

## 2020-02-09 ENCOUNTER — Telehealth: Payer: Self-pay | Admitting: Family Medicine

## 2020-02-09 MED ORDER — NALOXONE HCL 4 MG/0.1ML NA LIQD: 1.0000 | Freq: Once | NASAL | 0 refills | Status: AC

## 2020-02-09 NOTE — Telephone Encounter (Signed)
Pt is already scheduled to see Dr. Manuella Ghazi in [place], wants to be seen there/pt declined  neurology

## 2020-02-09 NOTE — Telephone Encounter (Signed)
I called the patient and explained to her that the insurance company suggested she have this medication, Narcan on hand just incase of an emergency overdose.  I informed her that it is a nasal spray and the provider sent it to her pharmacy, she understood.  Moksh Loomer,cma

## 2020-02-09 NOTE — Telephone Encounter (Signed)
Please let the patient know that I received a fax from her insurance company recommending that the patient be prescribed Narcan given that she is on tramadol and Xanax.  Narcan would help reverse the tramadol if she were to accidentally overdose.  Signs of overdose are respiratory depression, excessive drowsiness, difficulty breathing.  I have sent this to her pharmacy.  If she ever has to use this she should call 911 for evaluation. Thanks.

## 2020-02-14 ENCOUNTER — Ambulatory Visit
Admission: RE | Admit: 2020-02-14 | Discharge: 2020-02-14 | Disposition: A | Discharge status: Home / Self Care | Payer: PPO | Source: Ambulatory Visit | Attending: Internal Medicine | Admitting: Internal Medicine

## 2020-02-14 ENCOUNTER — Other Ambulatory Visit: Payer: Self-pay

## 2020-02-14 ENCOUNTER — Encounter: Payer: Self-pay | Admitting: Nurse Practitioner

## 2020-02-14 ENCOUNTER — Ambulatory Visit (INDEPENDENT_AMBULATORY_CARE_PROVIDER_SITE_OTHER): Payer: PPO | Admitting: Nurse Practitioner

## 2020-02-14 VITALS — BP 136/62 | HR 94 | Temp 97.1°F | Ht 63.5 in | Wt 150.2 lb

## 2020-02-14 DIAGNOSIS — W57XXXA Bitten or stung by nonvenomous insect and other nonvenomous arthropods, initial encounter: Secondary | ICD-10-CM

## 2020-02-14 DIAGNOSIS — R079 Chest pain, unspecified: Secondary | ICD-10-CM | POA: Insufficient documentation

## 2020-02-14 DIAGNOSIS — S30860A Insect bite (nonvenomous) of lower back and pelvis, initial encounter: Secondary | ICD-10-CM | POA: Diagnosis not present

## 2020-02-14 LAB — NM MYOCAR MULTI W/SPECT W/WALL MOTION / EF
Estimated workload: 1 METS
Exercise duration (min): 0 min
Exercise duration (sec): 0 s
LV dias vol: 46 mL (ref 46–106)
LV sys vol: 17 mL
MPHR: 158 {beats}/min
Peak HR: 102 {beats}/min
Percent HR: 64 %
Rest HR: 72 {beats}/min
SDS: 0
SRS: 0
SSS: 0
TID: 0.96

## 2020-02-14 MED ORDER — REGADENOSON 0.4 MG/5ML IV SOLN
0.4000 mg | Freq: Once | INTRAVENOUS | Status: AC
  Administered 2020-02-14: 0.4 mg via INTRAVENOUS

## 2020-02-14 MED ORDER — DOXYCYCLINE HYCLATE 100 MG PO TABS
ORAL_TABLET | ORAL | 0 refills | Status: DC
Start: 2020-02-14 — End: 2020-02-18

## 2020-02-14 MED ORDER — TECHNETIUM TC 99M TETROFOSMIN IV KIT
10.0000 | PACK | Freq: Once | INTRAVENOUS | Status: AC | PRN
  Administered 2020-02-14: 11 via INTRAVENOUS

## 2020-02-14 MED ORDER — TECHNETIUM TC 99M TETROFOSMIN IV KIT
30.0000 | PACK | Freq: Once | INTRAVENOUS | Status: AC | PRN
  Administered 2020-02-14: 31.946 via INTRAVENOUS

## 2020-02-14 NOTE — Patient Instructions (Addendum)
Please come to  the lab tomorrow at 2:30 for your alpha-gal panel test.   I have given  you a one time dose of doxycycline for Lyme disease prevention since you have 2 tick bites and on [place] tick was retrieved.  Monitor the bite sites and keep and clean and dry.   You may try over the counter hydrocortisone cream if they itch and bother you.    Let's follow these tick bites with close observation. Information below on Lyme disease and Tick bites in general.   If there is no improvement in your symptoms, or if there is any worsening of symptoms, or if you have any additional concerns, please return for re-evaluation; or, if we are closed, consider going to the Emergency Room for evaluation if symptoms urgent.  Patient education: Lyme disease (The Basics)[place]  Written by the doctors and editors at [employer]  What is Lyme disease? -- Lyme disease is an illness that can make you feel like you have the flu. It can also cause a rash, fever, or nerve, joint, or heart problems. People can get Lyme disease after being bitten by a tiny insect called a tick. When a certain type of tick bites you, it can transmit the germ that causes Lyme disease from its body to yours. But a tick can infect you only if it stays attached for at least a day. The ticks that carry Lyme disease feed on deer and mice. Ticks are found in tall grass and on shrubs, and can attach to animals and people walking by. Ticks cannot fly or jump. What are the symptoms of Lyme disease? -- Symptoms can start days or weeks after a tick bite. They include: ?A rash where you were bitten - The rash often appears within a month of getting bitten. It is red, but its center can be the color of your skin. It might get bigger over a few days. To some, it looks like a "bull's eye" (picture 1). ?Fever ?Feeling tired ?Body aches and pains ?Heart problems such as a slowed heart rate ?Headache and stiff neck ?Feelings of pain, weakness,  or numbness If a person is not treated, further symptoms can occur months to years after a tick bite. These include: ?Pain and swelling of joints, such as your knees ?Trouble with your memory and thinking ?Skin problems, such as skin swelling or thinning (this occurs mostly in [place]) Is there a test for Lyme disease? -- Yes. Blood tests can show if you are infected with the germ that causes Lyme disease. But, it takes time for the blood tests to turn positive. This means the tests won't work if you get them right after being bitten. Also, sometimes the blood tests come back negative even when you have the rash that goes with Lyme disease. Because of this, if you have the rash, the blood test is not needed to confirm that you have Lyme disease. If your doctor or nurse suspects you have Lyme disease, he or she will do an exam and ask you questions. The doctor or nurse will use this information (and your blood test result, if needed) to decide about treatment. What should I do if I get bitten by a tick or if my child gets bitten? -- If you find a tick on your body or on your child, use tweezers to grab it. Then pull it out slowly and gently. After that, wash the area with soap and water. You do not  need to keep the tick. But knowing what it looked like can help your doctor decide about your treatment. See if you can tell: ?Its color and size ?If it was attached to your skin or just resting on your skin ?If it was big, round, and full of blood (picture 2) You should watch the area around the bite for a month to see if a rash occurs. Should I see a doctor or nurse? -- See your doctor or nurse if you have a tick and you cannot get it off or if you think you have had a tick attached for at least 36 hours (a day and a half). You should also see a doctor or nurse if you develop symptoms of Lyme disease. Some people don't know that they were bitten by a tick. Or they might not remember having a rash or early  symptoms of Lyme disease. How is Lyme disease treated? -- Lyme disease is usually treated with antibiotics. Treatment with antibiotics should help your symptoms go away. Sometimes, symptoms improve quickly. Other times, it can take weeks or months for symptoms to go away. Your doctor might prescribe medicine for you to take right after a tick bite. Or your doctor might wait to see if you first develop symptoms. Either way, the medicine will treat your Lyme disease. What can I do to try to avoid getting bitten by a tick? -- You can: ?Wear shoes, long-sleeved shirts, and long pants when you go outside. Keep ticks away from your skin by tucking your pants into your socks. ?Wear light colors so you can spot any ticks that get on your clothes ?Use bug sprays to keep ticks off your skin or clothes ?Shower within 2 hours of being outdoors if you think you have been in an area where there are ticks ?Check your clothes and body for ticks after being outdoors. Be sure to check your scalp, waist, armpits, groin, and backs of your knees. Check your children, too. ?If you live in a place that has deer or mice nearby, take steps to keep those animals away. Deer and mice carry ticks.   Tick Bite Information Ticks are insects that attach themselves to the skin and draw blood for food. There are various types of ticks. Common types include wood ticks and deer ticks. Most ticks live in shrubs and grassy areas. Ticks can climb onto your body when you make contact with leaves or grass where the tick is waiting. The most common places on the body for ticks to attach themselves are the scalp, neck, armpits, waist, and groin. Most tick bites are harmless, but sometimes ticks carry germs that cause diseases. These germs can be spread to a person during the tick's feeding process. The chance of a disease spreading through a tick bite depends on:   The type of tick.  Time of year.    How long the tick is attached.     Geographic location.   HOW CAN YOU PREVENT TICK BITES? Take these steps to help prevent tick bites when you are outdoors:  Wear protective clothing. Long sleeves and long pants are best.    Wear white clothes so you can see ticks more easily.  Tuck your pant legs into your socks.    If walking on a trail, stay in the middle of the trail to avoid brushing against bushes.  Avoid walking through areas with long grass.   Put insect repellent on all exposed skin and along boot  tops, pant legs, and sleeve cuffs.    Check clothing, hair, and skin repeatedly and before going inside.    Brush off any ticks that are not attached.  Take a shower or bath as soon as possible after being outdoors.    WHAT IS THE PROPER WAY TO REMOVE A TICK? Ticks should be removed as soon as possible to help prevent diseases caused by tick bites. 1. If latex gloves are available, put them on before trying to remove a tick.   2. Using fine-point tweezers, grasp the tick as close to the skin as possible. You may also use curved forceps or a tick removal tool. Grasp the tick as close to its head as possible. Avoid grasping the tick on its body. 3. Pull gently with steady upward pressure until the tick lets go. Do not twist the tick or jerk it suddenly. This may break off the tick's head or mouth parts. 4. Do not squeeze or crush the tick's body. This could force disease-carrying fluids from the tick into your body.   5. After the tick is removed, wash the bite area and your hands with soap and water or other disinfectant such as alcohol. 6. Apply a small amount of antiseptic cream or ointment to the bite site.   7. Wash and disinfect any instruments that were used.   Do not try to remove a tick by applying a hot match, petroleum jelly, or fingernail polish to the tick. These methods do not work and may increase the chances of disease being spread from the tick bite.  WHEN SHOULD YOU SEEK MEDICAL CARE? Contact your  health care provider if you are unable to remove a tick from your skin or if a part of the tick breaks off and is stuck in the skin.  After a tick bite, you need to be aware of signs and symptoms that could be related to diseases spread by ticks. Contact your health care provider if you develop any of the following in the days or weeks after the tick bite:  Unexplained fever.  Rash. A circular rash that appears days or weeks after the tick bite may indicate the possibility of Lyme disease. The rash may resemble a target with a bull's-eye and may occur at a different part of your body than the tick bite.  Redness and swelling in the area of the tick bite.    Tender, swollen lymph glands.    Diarrhea.    Weight loss.    Cough.    Fatigue.    Muscle, joint, or bone pain.    Abdominal pain.    Headache.    Lethargy or a change in your level of consciousness.  Difficulty walking or moving your legs.    Numbness in the legs.    Paralysis.  Shortness of breath.    Confusion.    Repeated vomiting.     This information is not intended to replace advice given to you by your health care provider. Make sure you discuss any questions you have with your health care provider.   Document Released: 09/13/2000 Document Revised: 10/07/2014 Document Reviewed: 02/24/2013 Elsevier Interactive Patient Education [employer].

## 2020-02-14 NOTE — Progress Notes (Signed)
Established Patient Office Visit  Subjective:  Patient ID: Kristina Mccoy, female    DOB: June 06, 1956  Age: 64 y.o. MRN: [id number]  CC:  Chief Complaint  Patient presents with  . Acute Visit    tick bite    HPI Kristina Mccoy presents for evaluation of a tick bite that she discovered last night while watching TV. She felt a funny sensation on her back and she saw the tick. She removed it with tweezers. This morning, she noticed another bite site below it. She comes in today for evaluation to make sure a  Tick is not present. he bites are red, itchy and soreness around the bottom one. She thinks she may have got them playing with her friend's cat. She does not  go camping, garden, or walk in the woods. She has no pets. She has an autoimmune disease and reports chronic HA and is worried she might get Lyme disease and not now it. She would like preventative treatment. No unusual HA today and no fever/chills.    Past Medical History:  Diagnosis Date  . Abdominal pain, epigastric   . Abnormal chest CT 09/08/2014  . Allergic urticaria   . Blood clotting disorder ([place])   . Breast cancer ([place]) 5/14   left breast  . Breast cancer ([place]) 02/2013   Dr Laurance Flatten ([place])  . Diabetes mellitus   . Diverticulosis   . Fibromyalgia   . GERD (gastroesophageal reflux disease)   . Heart murmur   . Hyperlipidemia   . Lymphedema of arm    right  . Myalgia   . Myositis   . Neuromyositis   . Otitis media, chronic   . Pain syndrome, chronic   . Peptic ulcer   . Sicca syndrome ([place])   . Sjogren's disease ([place])   . Sleep apnea   . SOB (shortness of breath)   . Systemic involvement of connective tissue [facility])     Past Surgical History:  Procedure Laterality Date  . BREAST SURGERY Bilateral    mastectomy  . COLONOSCOPY WITH PROPOFOL N/A 04/22/2017   Procedure: COLONOSCOPY WITH PROPOFOL;  Surgeon: Lollie Sails, MD;  Location: [facility] ENDOSCOPY;  Service: Endoscopy;  Laterality: N/A;  .  ESOPHAGOGASTRODUODENOSCOPY (EGD) WITH PROPOFOL N/A 04/22/2017   Procedure: ESOPHAGOGASTRODUODENOSCOPY (EGD) WITH PROPOFOL;  Surgeon: Lollie Sails, MD;  Location: [facility] ENDOSCOPY;  Service: Endoscopy;  Laterality: N/A;  . MASTECTOMY Bilateral 03/23/13    Family History  Problem Relation Age of Onset  . Diabetes Mother   . Heart disease Mother   . Hyperlipidemia Mother   . Heart failure Mother   . Diabetes Other   . Heart disease Brother        Aortic valve disease  . Hyperlipidemia Brother   . Colon polyps Brother   . Stroke Maternal Grandmother   . Breast cancer Maternal Aunt   . Colon cancer Paternal Grandmother   . Rheum arthritis Sister   . Psoriasis Other        psoriatic arthritis   . Heart disease Brother 51       Tachycardia  . Heart disease Brother   . Ovarian cancer Neg Hx     Social History   Socioeconomic History  . Marital status: Single    Spouse name: Not on file  . Number of children: Not on file  . Years of education: Not on file  . Highest education level: Not on file  Occupational History  . Occupation:  disabled  Tobacco Use  . Smoking status: Former Smoker    Packs/day: 1.00    Years: 30.00    Pack years: 30.00    Types: Cigarettes    Quit date: 02/28/2013    Years since quitting: 6.9  . Smokeless tobacco: Never Used  Substance and Sexual Activity  . Alcohol use: No    Alcohol/week: 0.0 standard drinks  . Drug use: No  . Sexual activity: Never    Birth control/protection: Post-menopausal  Other Topics Concern  . Not on file  Social History Narrative  . Not on file   Social Determinants of Health   Financial Resource Strain:   . Difficulty of Paying Living Expenses:   Food Insecurity:   . Worried About Charity fundraiser in the Last Year:   . Arboriculturist in the Last Year:   Transportation Needs:   . Film/video editor (Medical):   Marland Kitchen Lack of Transportation (Non-Medical):   Physical Activity:   . Days of Exercise per  Week:   . Minutes of Exercise per Session:   Stress:   . Feeling of Stress :   Social Connections:   . Frequency of Communication with Friends and Family:   . Frequency of Social Gatherings with Friends and Family:   . Attends Religious Services:   . Active Member of Clubs or Organizations:   . Attends Archivist Meetings:   Marland Kitchen Marital Status:   Intimate Partner Violence:   . Fear of Current or Ex-Partner:   . Emotionally Abused:   Marland Kitchen Physically Abused:   . Sexually Abused:     Outpatient Medications Prior to Visit  Medication Sig Dispense Refill  . albuterol (VENTOLIN HFA) 108 (90 Base) MCG/ACT inhaler Inhale 2 puffs into the lungs every 4 (four) hours as needed for wheezing or shortness of breath. 18 g 0  . ALPRAZolam (XANAX) 0.5 MG tablet TAKE 1 TABLET (0.5 MG TOTAL) BY MOUTH 4 (FOUR) TIMES DAILY AS NEEDED FOR ANXIETY. 120 tablet 0  . aspirin EC 81 MG tablet Take 1 tablet (81 mg total) by mouth daily. 90 tablet 3  . Cholecalciferol (VITAMIN D3) 1000 units CAPS Take by mouth daily.     . clobetasol ointment (TEMOVATE) 0.05 % Apply topically. Vaginally    . Continuous Blood Gluc Sensor (FREESTYLE LIBRE 14 DAY SENSOR) MISC SMARTSIG:1 Each Topical Every 2 Weeks    . Cyanocobalamin 1000 MCG CAPS Take by mouth. Takes occassionally    . dicyclomine (BENTYL) 10 MG capsule Take 1 capsule (10 mg total) by mouth 3 (three) times daily as needed for spasms. 60 capsule 1  . DULoxetine (CYMBALTA) 30 MG capsule Take 1 capsule (30 mg total) by mouth daily for 7 days, THEN 2 capsules (60 mg total) daily. 180 capsule 1  . Emollient (CERAVE) CREA Apply topically daily.     . fluconazole (DIFLUCAN) 150 MG tablet Take 1 tablet (150 mg total) by mouth daily. Take one tablet today.  May repeat in 3 days. 2 tablet 0  . FREESTYLE LITE test strip CHECK 6 TIMES DAILY 100 each 3  . glucose blood (ONE TOUCH ULTRA TEST) test strip CHECK SIX TIMES DAILY 100 each 11  . insulin aspart (NOVOLOG FLEXPEN) 100  UNIT/ML FlexPen Take up to 2 - 4 units before meals up to 3 times daily    . insulin regular (NOVOLIN R) 100 units/mL injection Inject 100 Units into the skin 3 (three) times daily before meals.    Marland Kitchen  Insulin Syringe-Needle U-100 (INSULIN SYRINGE .5CC/31GX5/16") 31G X 5/16" 0.5 ML MISC Use twice daily with injections    . ketoconazole (NIZORAL) 2 % cream Apply 1 application topically 2 (two) times daily.  3  . ketoconazole (NIZORAL) 2 % shampoo Apply 1 application topically once a week.  11  . Lancets MISC Use. BAYER CONTOUR LANCETS    . metoprolol succinate (TOPROL XL) 25 MG 24 hr tablet Take 0.5 tablets (12.5 mg total) by mouth daily. 45 tablet 1  . nystatin cream (MYCOSTATIN) Apply 1 application topically as needed.    . ondansetron (ZOFRAN) 4 MG tablet TAKE 1 TABLET BY MOUTH EVERY 8 HOURS AS NEEDED FOR NAUSEA AND VOMITING 18 tablet 1  . ONETOUCH DELICA LANCETS 67E MISC TWICE DAILY 100 each 3  . Polyethyl Glycol-Propyl Glycol (SYSTANE OP) Apply to eye.    . pseudoephedrine-guaifenesin (MUCINEX D) 60-600 MG 12 hr tablet Take 1 tablet by mouth 2 (two) times daily as needed.     . Tiotropium Bromide Monohydrate (SPIRIVA RESPIMAT) 1.25 MCG/ACT AERS Inhale 2 puffs into the lungs daily. 1 g 3  . traMADol (ULTRAM) 50 MG tablet TAKE 1 TABLET BY MOUTH EVERY 6 HOURS AS NEEDED. FOR PAIN 120 tablet 1  . TRESIBA FLEXTOUCH 100 UNIT/ML SOPN FlexTouch Pen 20 Units at bedtime.  5  . triamcinolone (NASACORT ALLERGY 24HR) 55 MCG/ACT AERO nasal inhaler Place 2 sprays into the nose daily.    Marland Kitchen NARCAN 4 MG/0.1ML LIQD nasal spray kit 1 spray once.    . famotidine (PEPCID) 20 MG tablet Take 1 tablet (20 mg total) by mouth at bedtime for 14 days. 14 tablet 0   No facility-administered medications prior to visit.    Allergies  Allergen Reactions  . Gabapentin Itching and Swelling  . Oxycodone Itching and Rash  . Carbamazepine Nausea And Vomiting and Other (See Comments)    Abdominal pain   . Dulaglutide      Other reaction(s): Other (See Comments) Severe stomach pain  . Insulin Glargine Other (See Comments)    BLOATING, MUSCLE/JOINT PAIN  . Levaquin [Levofloxacin]     Diarrhea and pain in calf  . Metformin     Other reaction(s): Other (See Comments) Cramping- abdominal pain  . Methylprednisolone     Reddened face, puffy face   . Morphine Other (See Comments)  . Pregabalin Swelling    Swelling in arms and legs  . Venlafaxine Other (See Comments)  . Buprenorphine Rash and Swelling  . Canagliflozin Other (See Comments) and Rash    YEAST INFECTIONS  . Escitalopram Rash  . Hydroxychloroquine Rash     Review of Systems  Constitutional: Negative for chills and fever.  HENT: Negative.   Eyes: Negative.   Respiratory: Negative for cough and shortness of breath.   Cardiovascular: Negative for chest pain.  Gastrointestinal: Negative.   Genitourinary: Negative.   Musculoskeletal: Negative for arthralgias.  Skin: Positive for rash.  Allergic/Immunologic: Negative.   Neurological: Negative.       Objective:    Physical Exam  Constitutional: She appears well-developed and well-nourished.  HENT:  Head: Normocephalic and atraumatic.  Eyes: Pupils are equal, round, and reactive to light.  Cardiovascular: Normal rate and regular rhythm.  Pulmonary/Chest: Effort normal and breath sounds normal.  Musculoskeletal:     Cervical back: Normal range of motion.  Skin: Skin is warm and dry.  Two red, raised bite marks noted and examined with ophthalmoscope magnification. No retained ticks seen. No surrounding erythema,  tenderness or other lesions to consider shingles. There is a tiny- unrelated red mark to the left lateral side. The tick in the baggie is a [place] tick.   Vitals reviewed.   BP 136/62 (BP Location: Left Arm, Patient Position: Sitting, Cuff Size: Small)   Pulse 94   Temp (!) 97.1 F (36.2 C) (Skin)   Ht 5' 3.5" (1.613 m)   Wt 150 lb 3.2 oz (68.1 kg)   SpO2 99%   BMI  26.19 kg/m  Wt Readings from Last 3 Encounters:  02/14/20 150 lb 3.2 oz (68.1 kg)  01/12/20 151 lb (68.5 kg)  01/05/20 147 lb (66.7 kg)     Health Maintenance Due  Topic Date Due  . COVID-19 Vaccine (1) Never done  . TETANUS/TDAP  Never done  . FOOT EXAM  05/19/2015  . PAP SMEAR-Modifier  07/02/2016  . HEMOGLOBIN A1C  10/28/2018  . URINE MICROALBUMIN  12/23/2018  . OPHTHALMOLOGY EXAM  06/04/2019    There are no preventive care reminders to display for this patient.  Lab Results  Component Value Date   TSH 1.32 08/31/2018   Lab Results  Component Value Date   WBC 10.8 02/19/2019   HGB 13.5 02/19/2019   HCT 40.4 02/19/2019   MCV 92 02/19/2019   PLT 278 02/19/2019   Lab Results  Component Value Date   NA 134 (L) 08/17/2019   K 4.2 08/17/2019   CO2 25 08/17/2019   GLUCOSE 198 (H) 08/17/2019   BUN 13 08/17/2019   CREATININE 0.76 08/17/2019   BILITOT 0.5 02/19/2019   ALKPHOS 196 (H) 02/19/2019   AST 25 02/19/2019   ALT 31 02/19/2019   PROT 7.2 02/19/2019   ALBUMIN 4.0 02/19/2019   CALCIUM 9.0 08/17/2019   ANIONGAP 10 01/22/2017   GFR 77.22 08/17/2019   Lab Results  Component Value Date   CHOL 215 (H) 04/27/2018   Lab Results  Component Value Date   HDL 48.00 04/27/2018   Lab Results  Component Value Date   LDLCALC 144 (H) 04/27/2018      Assessment & Plan:   Problem List Items Addressed This Visit      Musculoskeletal and Integument   Tick bite of back - Primary   Relevant Orders   Alpha-Gal Panel      Meds ordered this encounter  Medications  . doxycycline (VIBRA-TABS) 100 MG tablet    Sig: Take two tablets by mouth ( total of 200 mg) once for lyme prophylaxis.    Dispense:  2 tablet    Refill:  0    Order Specific Question:   Supervising Provider    Answer:   Einar Pheasant [[id number]]   The patient is very concerned about her risk for tick born illness. She does not know if the other bite was from the same tick or a deer tick. Will  treat with prophylaxis doxy as the patient wants to be on the safe side. We discussed alpha gal allergy to mammalian meat as a slight possibility with a [place] bite. She wants to get tested for that and will come in tomorrow for a lab draw. She was given general tick information on her AVS and instructed the red flag symptoms to return.   Advised: Please come to  the lab tomorrow at 2:30 for your alpha-gal panel test.   I have given  you a one time dose of doxycycline for Lyme disease prevention since you have 2 tick bites and on  [place] tick was retrieved.  Monitor the bite sites and keep and clean and dry.   You may try over the counter hydrocortisone cream if they itch and bother you.    Let's follow these tick bites with close observation. Information below on Lyme disease and Tick bites in general.   If there is no improvement in your symptoms, or if there is any worsening of symptoms, or if you have any additional concerns, please return for re-evaluation; or, if we are closed, consider going to the Emergency Room for evaluation if symptoms urgent.   Follow-up: No follow-ups on file.    Denice Paradise, NP

## 2020-02-15 ENCOUNTER — Other Ambulatory Visit (INDEPENDENT_AMBULATORY_CARE_PROVIDER_SITE_OTHER): Payer: PPO

## 2020-02-15 ENCOUNTER — Other Ambulatory Visit: Payer: Self-pay

## 2020-02-15 DIAGNOSIS — M2550 Pain in unspecified joint: Secondary | ICD-10-CM

## 2020-02-15 DIAGNOSIS — Z9989 Dependence on other enabling machines and devices: Secondary | ICD-10-CM | POA: Diagnosis not present

## 2020-02-15 DIAGNOSIS — G4733 Obstructive sleep apnea (adult) (pediatric): Secondary | ICD-10-CM | POA: Diagnosis not present

## 2020-02-15 DIAGNOSIS — E538 Deficiency of other specified B group vitamins: Secondary | ICD-10-CM | POA: Diagnosis not present

## 2020-02-15 DIAGNOSIS — R519 Headache, unspecified: Secondary | ICD-10-CM | POA: Diagnosis not present

## 2020-02-15 DIAGNOSIS — M7918 Myalgia, other site: Secondary | ICD-10-CM | POA: Diagnosis not present

## 2020-02-15 DIAGNOSIS — E519 Thiamine deficiency, unspecified: Secondary | ICD-10-CM | POA: Diagnosis not present

## 2020-02-15 DIAGNOSIS — S30860A Insect bite (nonvenomous) of lower back and pelvis, initial encounter: Secondary | ICD-10-CM

## 2020-02-15 DIAGNOSIS — M791 Myalgia, unspecified site: Secondary | ICD-10-CM

## 2020-02-15 DIAGNOSIS — M797 Fibromyalgia: Secondary | ICD-10-CM | POA: Diagnosis not present

## 2020-02-15 DIAGNOSIS — R4189 Other symptoms and signs involving cognitive functions and awareness: Secondary | ICD-10-CM | POA: Diagnosis not present

## 2020-02-15 DIAGNOSIS — W57XXXA Bitten or stung by nonvenomous insect and other nonvenomous arthropods, initial encounter: Secondary | ICD-10-CM

## 2020-02-15 DIAGNOSIS — R2689 Other abnormalities of gait and mobility: Secondary | ICD-10-CM | POA: Diagnosis not present

## 2020-02-15 DIAGNOSIS — E1165 Type 2 diabetes mellitus with hyperglycemia: Secondary | ICD-10-CM | POA: Diagnosis not present

## 2020-02-15 DIAGNOSIS — G44221 Chronic tension-type headache, intractable: Secondary | ICD-10-CM | POA: Diagnosis not present

## 2020-02-15 LAB — SEDIMENTATION RATE: Sed Rate: 19 mm/hr (ref 0–30)

## 2020-02-15 LAB — COMPREHENSIVE METABOLIC PANEL
ALT: 38 U/L — ABNORMAL HIGH (ref 0–35)
AST: 29 U/L (ref 0–37)
Albumin: 3.9 g/dL (ref 3.5–5.2)
Alkaline Phosphatase: 102 U/L (ref 39–117)
BUN: 19 mg/dL (ref 6–23)
CO2: 26 mEq/L (ref 19–32)
Calcium: 8.8 mg/dL (ref 8.4–10.5)
Chloride: 97 mEq/L (ref 96–112)
Creatinine, Ser: 0.75 mg/dL (ref 0.40–1.20)
GFR: 78.28 mL/min (ref >60.00)
Glucose, Bld: 183 mg/dL — ABNORMAL HIGH (ref 70–99)
Potassium: 3.5 mEq/L (ref 3.5–5.1)
Sodium: 130 mEq/L — ABNORMAL LOW (ref 135–145)
Total Bilirubin: 0.4 mg/dL (ref 0.2–1.2)
Total Protein: 6.9 g/dL (ref 6.0–8.3)

## 2020-02-15 LAB — C-REACTIVE PROTEIN: CRP: 1.2 mg/dL (ref 0.5–20.0)

## 2020-02-15 LAB — HEMOGLOBIN A1C: Hgb A1c MFr Bld: 8 % — ABNORMAL HIGH (ref 4.6–6.5)

## 2020-02-15 LAB — MICROALBUMIN / CREATININE URINE RATIO
Creatinine,U: 38.4 mg/dL
Microalb Creat Ratio: 1.8 mg/g (ref 0.0–30.0)
Microalb, Ur: <0.7 mg/dL (ref 0.0–1.9)

## 2020-02-15 LAB — CK: Total CK: 57 U/L (ref 7–177)

## 2020-02-16 LAB — TSH: TSH: 0.57 u[IU]/mL (ref 0.35–4.50)

## 2020-02-16 LAB — VITAMIN D 25 HYDROXY (VIT D DEFICIENCY, FRACTURES): VITD: 29.69 ng/mL — ABNORMAL LOW (ref 30.00–100.00)

## 2020-02-18 ENCOUNTER — Other Ambulatory Visit: Payer: Self-pay

## 2020-02-18 ENCOUNTER — Ambulatory Visit
Admission: EM | Admit: 2020-02-18 | Discharge: 2020-02-18 | Disposition: A | Discharge status: Home / Self Care | Payer: PPO | Attending: Urgent Care | Admitting: Urgent Care

## 2020-02-18 ENCOUNTER — Encounter: Payer: Self-pay | Admitting: Emergency Medicine

## 2020-02-18 ENCOUNTER — Other Ambulatory Visit: Payer: Self-pay | Admitting: Family Medicine

## 2020-02-18 ENCOUNTER — Telehealth: Payer: Self-pay | Admitting: Nurse Practitioner

## 2020-02-18 ENCOUNTER — Encounter: Payer: Self-pay | Admitting: Family Medicine

## 2020-02-18 DIAGNOSIS — E119 Type 2 diabetes mellitus without complications: Secondary | ICD-10-CM | POA: Insufficient documentation

## 2020-02-18 DIAGNOSIS — Z79899 Other long term (current) drug therapy: Secondary | ICD-10-CM | POA: Diagnosis not present

## 2020-02-18 DIAGNOSIS — S30860D Insect bite (nonvenomous) of lower back and pelvis, subsequent encounter: Secondary | ICD-10-CM | POA: Diagnosis not present

## 2020-02-18 DIAGNOSIS — M797 Fibromyalgia: Secondary | ICD-10-CM | POA: Insufficient documentation

## 2020-02-18 DIAGNOSIS — Z803 Family history of malignant neoplasm of breast: Secondary | ICD-10-CM | POA: Diagnosis not present

## 2020-02-18 DIAGNOSIS — R21 Rash and other nonspecific skin eruption: Secondary | ICD-10-CM

## 2020-02-18 DIAGNOSIS — Z7982 Long term (current) use of aspirin: Secondary | ICD-10-CM | POA: Diagnosis not present

## 2020-02-18 DIAGNOSIS — M3500 Sicca syndrome, unspecified: Secondary | ICD-10-CM | POA: Insufficient documentation

## 2020-02-18 DIAGNOSIS — Z833 Family history of diabetes mellitus: Secondary | ICD-10-CM | POA: Insufficient documentation

## 2020-02-18 DIAGNOSIS — R5383 Other fatigue: Secondary | ICD-10-CM | POA: Diagnosis not present

## 2020-02-18 DIAGNOSIS — Z9013 Acquired absence of bilateral breasts and nipples: Secondary | ICD-10-CM | POA: Insufficient documentation

## 2020-02-18 DIAGNOSIS — K219 Gastro-esophageal reflux disease without esophagitis: Secondary | ICD-10-CM | POA: Diagnosis not present

## 2020-02-18 DIAGNOSIS — Z8349 Family history of other endocrine, nutritional and metabolic diseases: Secondary | ICD-10-CM | POA: Insufficient documentation

## 2020-02-18 DIAGNOSIS — M2550 Pain in unspecified joint: Secondary | ICD-10-CM

## 2020-02-18 DIAGNOSIS — D689 Coagulation defect, unspecified: Secondary | ICD-10-CM | POA: Diagnosis not present

## 2020-02-18 DIAGNOSIS — Z87891 Personal history of nicotine dependence: Secondary | ICD-10-CM | POA: Diagnosis not present

## 2020-02-18 DIAGNOSIS — Z794 Long term (current) use of insulin: Secondary | ICD-10-CM | POA: Insufficient documentation

## 2020-02-18 DIAGNOSIS — E785 Hyperlipidemia, unspecified: Secondary | ICD-10-CM | POA: Insufficient documentation

## 2020-02-18 DIAGNOSIS — E871 Hypo-osmolality and hyponatremia: Secondary | ICD-10-CM

## 2020-02-18 DIAGNOSIS — K743 Primary biliary cirrhosis: Secondary | ICD-10-CM | POA: Insufficient documentation

## 2020-02-18 DIAGNOSIS — Z885 Allergy status to narcotic agent status: Secondary | ICD-10-CM | POA: Diagnosis not present

## 2020-02-18 DIAGNOSIS — I429 Cardiomyopathy, unspecified: Secondary | ICD-10-CM | POA: Diagnosis not present

## 2020-02-18 DIAGNOSIS — Z853 Personal history of malignant neoplasm of breast: Secondary | ICD-10-CM | POA: Diagnosis not present

## 2020-02-18 DIAGNOSIS — Z888 Allergy status to other drugs, medicaments and biological substances status: Secondary | ICD-10-CM | POA: Diagnosis not present

## 2020-02-18 DIAGNOSIS — W57XXXD Bitten or stung by nonvenomous insect and other nonvenomous arthropods, subsequent encounter: Secondary | ICD-10-CM | POA: Insufficient documentation

## 2020-02-18 DIAGNOSIS — R509 Fever, unspecified: Secondary | ICD-10-CM

## 2020-02-18 MED ORDER — DOXYCYCLINE HYCLATE 100 MG PO TABS
100.0000 mg | ORAL_TABLET | Freq: Two times a day (BID) | ORAL | 0 refills | Status: AC
Start: 2020-02-18 — End: 2020-02-28

## 2020-02-18 NOTE — ED Triage Notes (Signed)
Patient states that she pulled a tick off her back on Sunday.  Patient states that her PCP tested her for Lyme Disease and RMSF.  Patient c/o fever and pain at the site.  Patient states that she took a preventative one time dose of Doxycycline on Monday.

## 2020-02-18 NOTE — Telephone Encounter (Signed)
Tick bite site is more red at the site and lump at the site where she pulled out the tick.  It is itchy and sharp shooting pain at the tick bite site.   She has chronic pain and hard to tell the difference.   Last night, she ran a fever of 99.5 with chills and stomach pain when she eats.  Her alpha gal- test is in process. The only test not done was the aldolase. The rest of the test is in process and it takes >1 week to result.   PLAN:  With chills, fever and increase redness at the tick bite site, I recommend in-person evaluation at [facility] to r/o cellulitis.  They are open until 8 pm. Patient is in agreement.

## 2020-02-18 NOTE — Discharge Instructions (Signed)
It was very nice seeing you today in clinic. Thank you for entrusting me with your care.   RMSF and Lyme disease labs obtained today.  Rest and ensure adequate hydration Take antibiotics as prescribed for 10 days.   Make arrangements to follow up with your regular doctor in 1 week for re-evaluation if not improving. If your symptoms/condition worsens, please seek follow up care either here or in the ER. Please remember, our [place] providers are "right here with you" when you need [place].   Again, it was my pleasure to take care of you today. Thank you for choosing our clinic. I hope that you start to feel better quickly.   Honor Loh, MSN, APRN, FNP-C, CEN Advanced Practice Provider [facility]

## 2020-02-19 LAB — ALPHA-GAL PANEL
Beef IgE: <0.1 kU/L (ref <0.35)
Class: 0
Class: 0
Class: 0
Galactose-alpha-1,3-galactose IgE: <0.1 kU/L (ref <0.10)
LAMB/MUTTON IGE: <0.1 kU/L (ref <0.35)
Pork IgE: <0.1 kU/L (ref <0.35)

## 2020-02-19 LAB — TIQ-MISC

## 2020-02-19 LAB — ALDOLASE

## 2020-02-20 NOTE — ED Provider Notes (Signed)
Kristina Mccoy, Kristina Mccoy   Name: Kristina Mccoy DOB: 1956-01-29 MRN: [id number] CSN: [id number] PCP: [name], MD  Arrival date and time:  02/18/20 1918  Chief Complaint:  Insect Bite (tick)  NOTE: Prior to seeing the patient today, I have reviewed the triage nursing documentation and vital signs. Clinical staff has updated patient's PMH/PSHx, current medication list, and drug allergies/intolerances to ensure comprehensive history available to assist in medical decision making.   History:   HPI: Kristina Mccoy is a 64 y.o. female who presents today with complaints of being bitten by a tick. Patient notes that she removed a tick from her back on Sunday (02/13/2020). She states, "I was bitten by a [place] tick". She is unsure how long the tick was embedded. The tick was not noted to be engorged at the time it was removed by the patient. Patient notes that the tick was removed fully intact.  Patient presented to her PCP the following day with concerns following the bite; notes reviewed.  Patient had labs (aldolase and alpha-gal) performed and was treated with a single 200 mg dose of doxycycline.  She was discharged home to pursue supportive care and monitor symptoms. Alpha-gal testing (-). Aldolase was improperly collected and thus cancelled by the performing lab.  Patient presents today with complaints of generalized stomach pain, itching/burning at the site where the tick was removed, diffuse arthralgias, and low-grade fevers (T-max 99.9). Despite her symptoms, patient has not taken any over the counter interventions to help improve/relieve her reported symptoms at home.  Patient contacted her PCP today for recommendations and was directed urgent care for further evaluation.  Past Medical History:  Diagnosis Date  . Abdominal pain, epigastric   . Abnormal chest CT 09/08/2014  . Allergic urticaria   . Blood clotting disorder ([place])   . Breast cancer ([place]) 5/14   left breast  . Breast  cancer ([place]) 02/2013   Dr Laurance Flatten ([place])  . Diabetes mellitus   . Diverticulosis   . Fibromyalgia   . GERD (gastroesophageal reflux disease)   . Heart murmur   . Hyperlipidemia   . Lymphedema of arm    right  . Myalgia   . Myositis   . Neuromyositis   . Otitis media, chronic   . Pain syndrome, chronic   . Peptic ulcer   . Sicca syndrome ([place])   . Sjogren's disease ([place])   . Sleep apnea   . SOB (shortness of breath)   . Systemic involvement of connective tissue [facility])     Past Surgical History:  Procedure Laterality Date  . BREAST SURGERY Bilateral    mastectomy  . COLONOSCOPY WITH PROPOFOL N/A 04/22/2017   Procedure: COLONOSCOPY WITH PROPOFOL;  Surgeon: Lollie Sails, MD;  Location: [facility] ENDOSCOPY;  Service: Endoscopy;  Laterality: N/A;  . ESOPHAGOGASTRODUODENOSCOPY (EGD) WITH PROPOFOL N/A 04/22/2017   Procedure: ESOPHAGOGASTRODUODENOSCOPY (EGD) WITH PROPOFOL;  Surgeon: Lollie Sails, MD;  Location: [facility] ENDOSCOPY;  Service: Endoscopy;  Laterality: N/A;  . MASTECTOMY Bilateral 03/23/13    Family History  Problem Relation Age of Onset  . Diabetes Mother   . Heart disease Mother   . Hyperlipidemia Mother   . Heart failure Mother   . Diabetes Other   . Heart disease Brother        Aortic valve disease  . Hyperlipidemia Brother   . Colon polyps Brother   . Stroke Maternal Grandmother   . Breast cancer Maternal Aunt   . Colon cancer  Paternal Grandmother   . Rheum arthritis Sister   . Psoriasis Other        psoriatic arthritis   . Heart disease Brother 51       Tachycardia  . Heart disease Brother   . Ovarian cancer Neg Hx     Social History   Tobacco Use  . Smoking status: Former Smoker    Packs/day: 1.00    Years: 30.00    Pack years: 30.00    Types: Cigarettes    Quit date: 02/28/2013    Years since quitting: 6.9  . Smokeless tobacco: Never Used  Substance Use Topics  . Alcohol use: No    Alcohol/week: 0.0 standard drinks  . Drug use: No     Patient Active Problem List   Diagnosis Date Noted  . Tick bite of back 02/14/2020  . Cardiomyopathy ([place]) 01/13/2020  . History of chest pain 10/20/2019  . Former smoker 08/30/2019  . Restrictive lung disease 08/30/2019  . Foot pain 02/16/2019  . Recurrent sinusitis 01/05/2019  . Hair loss 08/31/2018  . Bilateral leg cramps 05/27/2018  . Memory difficulty 04/27/2018  . Iliotibial band syndrome 04/27/2018  . Respiratory illness 04/27/2018  . Allergic rhinitis 01/26/2018  . RUQ pain 10/28/2017  . Obesity (BMI 30.0-34.9) 10/28/2017  . Pain in limb 06/03/2017  . Chest pain 01/17/2017  . Throat fullness 10/15/2016  . 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 Rash 08/08/2016  . Lower abdominal pain 08/08/2016  . Cephalalgia 01/26/2016  . Dysuria 12/20/2015  . Vitamin D deficiency 12/20/2015  . Sjogren's syndrome ([place]) 12/20/2015  . Chronic fatigue 12/04/2015  . Insomnia 10/19/2015  . Chronic bilateral low back pain without sciatica 11/07/2014  . OSA on CPAP 10/19/2014  . Anxiety and depression 08/16/2014  . Allergic urticaria 06/02/2014  . Dyspnea 05/04/2014  . Diabetes type 2, uncontrolled ([place]) 07/06/2013  . Malignant neoplasm of breast (female) ([place]) 02/28/2013  . Blood clotting disorder ([place]) 12/01/2012  . Palpitations 12/01/2012  . Hyperlipidemia 12/01/2012  . Chronic pain disorder 06/11/2012  . Hearing loss 09/16/2011  . Fibromyalgia 06/15/2011    Home Medications:    Current Meds  Medication Sig  . albuterol (VENTOLIN HFA) 108 (90 Base) MCG/ACT inhaler Inhale 2 puffs into the lungs every 4 (four) hours as needed for wheezing or shortness of breath.  . ALPRAZolam (XANAX) 0.5 MG tablet TAKE 1 TABLET (0.5 MG TOTAL) BY MOUTH 4 (FOUR) TIMES DAILY AS NEEDED FOR ANXIETY.  Marland Kitchen aspirin EC 81 MG tablet Take 1 tablet (81 mg total) by mouth daily.  . Cholecalciferol (VITAMIN D3) 1000 units CAPS Take by mouth daily.   . clobetasol ointment (TEMOVATE) 0.05 % Apply  topically. Vaginally  . Continuous Blood Gluc Sensor (FREESTYLE LIBRE 14 DAY SENSOR) MISC SMARTSIG:1 Each Topical Every 2 Weeks  . Cyanocobalamin 1000 MCG CAPS Take by mouth. Takes occassionally  . dicyclomine (BENTYL) 10 MG capsule Take 1 capsule (10 mg total) by mouth 3 (three) times daily as needed for spasms.  . famotidine (PEPCID) 20 MG tablet Take 1 tablet (20 mg total) by mouth at bedtime for 14 days.  . insulin aspart (NOVOLOG FLEXPEN) 100 UNIT/ML FlexPen Take up to 2 - 4 units before meals up to 3 times daily  . insulin regular (NOVOLIN R) 100 units/mL injection Inject 100 Units into the skin 3 (three) times daily before meals.  . metoprolol succinate (TOPROL XL) 25 MG 24 hr tablet Take 0.5 tablets (12.5 mg total) by mouth  daily.  . traMADol (ULTRAM) 50 MG tablet TAKE 1 TABLET BY MOUTH EVERY 6 HOURS AS NEEDED. FOR PAIN  . TRESIBA FLEXTOUCH 100 UNIT/ML SOPN FlexTouch Pen 20 Units at bedtime.    Allergies:   Gabapentin, Oxycodone, Carbamazepine, Dulaglutide, Insulin glargine, Levaquin [levofloxacin], Metformin, Methylprednisolone, Morphine, Pregabalin, Venlafaxine, Buprenorphine, Canagliflozin, Escitalopram, and Hydroxychloroquine  Review of Systems (ROS):  Review of systems NEGATIVE unless otherwise noted in narrative H&P section.   Vital Signs: Today's Vitals   02/18/20 1926 02/18/20 1929 02/18/20 2004  BP:  122/79   Pulse:  92   Resp:  14   Temp:  98.4 F (36.9 C)   TempSrc:  Oral   SpO2:  98%   Weight: 150 lb (68 kg)    Height: 5\' 4"  (1.626 m)    PainSc: 7   7     Physical Exam: Physical Exam  Constitutional: She is oriented to person, place, and time and well-developed, well-nourished, and in no distress.  HENT:  Head: Normocephalic and atraumatic.  Eyes: Pupils are equal, round, and reactive to light.  Cardiovascular: Normal rate, regular rhythm, normal heart sounds and intact distal pulses.  Pulmonary/Chest: Effort normal and breath sounds normal.  Abdominal:  Soft. Normal appearance and bowel sounds are normal. She exhibits no distension. There is generalized abdominal tenderness ("soreness").  Musculoskeletal:        General: Normal range of motion.  Neurological: She is alert and oriented to person, place, and time. Gait normal.  Skin: Skin is warm and dry. Rash noted. She is not diaphoretic. There is erythema (2 discreet areas to LEFT mid and lower back; appear unchanhed from assment on 02/14/2020; see attached medical phograph).  No discreet erythema migrans noted.  Patient with increased facial redness (forehead and cheeks). (+) malar flush noted, which is "very different and abnormal" per patient report. See attached medical photograph.   Psychiatric: Memory, affect and judgment normal. Her mood appears anxious.  Nursing note and vitals reviewed.       Urgent Care Treatments / Results:   No orders of the defined types were placed in this encounter.   LABS: PLEASE NOTE: all labs that were ordered this encounter are listed, however only abnormal results are displayed. Labs Reviewed  ROCKY MTN SPOTTED FVR ABS PNL(IGG+IGM)  B. BURGDORFI ANTIBODIES    EKG: -None  RADIOLOGY: No results found.  PROCEDURES: Procedures  MEDICATIONS RECEIVED THIS VISIT: Medications - No data to display  PERTINENT CLINICAL COURSE NOTES/UPDATES:   Initial Impression / Assessment and Plan / Urgent Care Course:  Pertinent labs & imaging results that were available during my care of the patient were personally reviewed by me and considered in my medical decision making (see lab/imaging section of note for values and interpretations).  Trenady Debenedetto is a 64 y.o. female who presents to [facility] today with complaints of Insect Bite (tick)  Patient is well appearing overall in clinic today. She does not appear to be in any acute distress. Presenting symptoms (see HPI) and exam as documented above.  Patient presents approximately 5 days  following tick removal.  She has had lab work performed with her PCP that was negative for alpha gal.  She has been prophylactically treated with a single dose of oral doxycycline.  Patient presents today feeling worse overall.  She has developed symptoms consistent with tickborne illness; generalized abdominal pain, diffuse arthralgias, and low-grade temperatures.  Exam reveals tenderness to site where ticks were removed.  There is  no areas of discrete erythema migrans noted.  Interestingly, patient has malar flushing to her face, which is not something that she typically experiences.  Patient is very anxious and concerned about potential for tickborne illness.  At this point, symptoms are starting to declare.  Will check additional labs (RMSF and B. burgdorfi Ab).  Patient does not wish to wait for results and desires additional treatment.  Patient is asking for something other than doxycycline citing that it made her stomach pain worse.  Discussed that probability of Lyme disease felt to be low, however RMSF is much more likely given her symptoms.  Reviewed treatment options for RMSF which include doxycycline (first-line) and chloramphenicol.  Chloramphenicol generally requires inpatient admission and serial lab monitoring.  Patient agrees to doxycycline course Rx sent.  Patient advised that if labs result is negative, she will be contacted and advised that she can stop empiric antimicrobial therapy. Reviewed supportive care measures; rest, increased hydration, and PRN use of APAP and/or IBU for discomfort/fever.   Discussed follow up with primary care physician in 1 week for re-evaluation. I have reviewed the follow up and strict return precautions for any new or worsening symptoms. Patient is aware of symptoms that would be deemed urgent/emergent, and would thus require further evaluation either here or in the emergency department. At the time of discharge, she verbalized understanding and consent with the  discharge plan as it was reviewed with her. All questions were fielded by provider and/or clinic staff prior to patient discharge.    Final Clinical Impressions / Urgent Care Diagnoses:   Final diagnoses:  Fever, unspecified fever cause  Arthralgia, unspecified joint  Fatigue, unspecified type  Rash of face  Tick bite of back, subsequent encounter    New Prescriptions:  [place] Controlled Substance Registry consulted? Not Applicable  Meds ordered this encounter  Medications  . doxycycline (VIBRA-TABS) 100 MG tablet    Sig: Take 1 tablet (100 mg total) by mouth 2 (two) times daily for 10 days.    Dispense:  20 tablet    Refill:  0    Recommended Follow up Care:  Patient encouraged to follow up with the following provider within the specified time frame, or sooner as dictated by the severity of her symptoms. As always, she was instructed that for any urgent/emergent care needs, she should seek care either here or in the emergency department for more immediate evaluation.  Follow-up Information    [name], MD In 1 week.   Specialty: Family Medicine Why: General reassessment of symptoms if not improving Contact information: [address] [place] [address] [phone]         NOTE: This note was prepared using Dragon dictation software along with smaller phrase technology. Despite my best ability to proofread, there is the potential that transcriptional errors may still occur from this process, and are completely unintentional.    Karen Kitchens, NP 02/20/20 [phone]

## 2020-02-21 ENCOUNTER — Other Ambulatory Visit: Payer: Self-pay | Admitting: Family Medicine

## 2020-02-21 ENCOUNTER — Telehealth: Payer: Self-pay

## 2020-02-21 DIAGNOSIS — M791 Myalgia, unspecified site: Secondary | ICD-10-CM

## 2020-02-21 NOTE — Telephone Encounter (Signed)
-----   Message from [name], MD sent at 02/21/2020 10:52 AM EDT ----- Please let the patient know that one of her labs could not be completed. We will need to repeat this test. I have placed orders to be repeated. Please get her scheduled.

## 2020-02-22 LAB — B. BURGDORFI ANTIBODIES: B burgdorferi Ab IgG+IgM: <0.91 {ISR} (ref 0.00–0.90)

## 2020-02-22 LAB — ROCKY MTN SPOTTED FVR ABS PNL(IGG+IGM)
RMSF IgG: NEGATIVE
RMSF IgM: 0.37 index (ref 0.00–0.89)

## 2020-02-23 ENCOUNTER — Other Ambulatory Visit: Payer: Self-pay | Admitting: Family Medicine

## 2020-02-24 ENCOUNTER — Other Ambulatory Visit: Payer: Self-pay

## 2020-02-24 ENCOUNTER — Ambulatory Visit
Admission: EM | Admit: 2020-02-24 | Discharge: 2020-02-24 | Disposition: A | Discharge status: Home / Self Care | Payer: PPO | Attending: Emergency Medicine | Admitting: Emergency Medicine

## 2020-02-24 DIAGNOSIS — Z888 Allergy status to other drugs, medicaments and biological substances status: Secondary | ICD-10-CM | POA: Diagnosis not present

## 2020-02-24 DIAGNOSIS — Z794 Long term (current) use of insulin: Secondary | ICD-10-CM | POA: Diagnosis not present

## 2020-02-24 DIAGNOSIS — K743 Primary biliary cirrhosis: Secondary | ICD-10-CM | POA: Insufficient documentation

## 2020-02-24 DIAGNOSIS — Z8249 Family history of ischemic heart disease and other diseases of the circulatory system: Secondary | ICD-10-CM | POA: Diagnosis not present

## 2020-02-24 DIAGNOSIS — H6690 Otitis media, unspecified, unspecified ear: Secondary | ICD-10-CM | POA: Diagnosis not present

## 2020-02-24 DIAGNOSIS — Z833 Family history of diabetes mellitus: Secondary | ICD-10-CM | POA: Insufficient documentation

## 2020-02-24 DIAGNOSIS — G4700 Insomnia, unspecified: Secondary | ICD-10-CM | POA: Diagnosis not present

## 2020-02-24 DIAGNOSIS — E119 Type 2 diabetes mellitus without complications: Secondary | ICD-10-CM | POA: Diagnosis not present

## 2020-02-24 DIAGNOSIS — M797 Fibromyalgia: Secondary | ICD-10-CM | POA: Insufficient documentation

## 2020-02-24 DIAGNOSIS — I429 Cardiomyopathy, unspecified: Secondary | ICD-10-CM | POA: Diagnosis not present

## 2020-02-24 DIAGNOSIS — Z8349 Family history of other endocrine, nutritional and metabolic diseases: Secondary | ICD-10-CM | POA: Insufficient documentation

## 2020-02-24 DIAGNOSIS — Z885 Allergy status to narcotic agent status: Secondary | ICD-10-CM | POA: Insufficient documentation

## 2020-02-24 DIAGNOSIS — G8929 Other chronic pain: Secondary | ICD-10-CM | POA: Diagnosis not present

## 2020-02-24 DIAGNOSIS — L03312 Cellulitis of back [any part except buttock]: Secondary | ICD-10-CM | POA: Insufficient documentation

## 2020-02-24 DIAGNOSIS — Z23 Encounter for immunization: Secondary | ICD-10-CM | POA: Diagnosis not present

## 2020-02-24 DIAGNOSIS — G4733 Obstructive sleep apnea (adult) (pediatric): Secondary | ICD-10-CM | POA: Diagnosis not present

## 2020-02-24 DIAGNOSIS — Z79899 Other long term (current) drug therapy: Secondary | ICD-10-CM | POA: Insufficient documentation

## 2020-02-24 DIAGNOSIS — Z853 Personal history of malignant neoplasm of breast: Secondary | ICD-10-CM | POA: Insufficient documentation

## 2020-02-24 DIAGNOSIS — Z87891 Personal history of nicotine dependence: Secondary | ICD-10-CM | POA: Diagnosis not present

## 2020-02-24 DIAGNOSIS — E785 Hyperlipidemia, unspecified: Secondary | ICD-10-CM | POA: Diagnosis not present

## 2020-02-24 DIAGNOSIS — M3500 Sicca syndrome, unspecified: Secondary | ICD-10-CM | POA: Insufficient documentation

## 2020-02-24 DIAGNOSIS — R5383 Other fatigue: Secondary | ICD-10-CM | POA: Diagnosis not present

## 2020-02-24 DIAGNOSIS — Z803 Family history of malignant neoplasm of breast: Secondary | ICD-10-CM | POA: Diagnosis not present

## 2020-02-24 DIAGNOSIS — Z7982 Long term (current) use of aspirin: Secondary | ICD-10-CM | POA: Diagnosis not present

## 2020-02-24 DIAGNOSIS — M545 Low back pain: Secondary | ICD-10-CM | POA: Diagnosis not present

## 2020-02-24 DIAGNOSIS — K219 Gastro-esophageal reflux disease without esophagitis: Secondary | ICD-10-CM | POA: Insufficient documentation

## 2020-02-24 DIAGNOSIS — W57XXXA Bitten or stung by nonvenomous insect and other nonvenomous arthropods, initial encounter: Secondary | ICD-10-CM | POA: Insufficient documentation

## 2020-02-24 MED ORDER — DOXYCYCLINE HYCLATE 100 MG PO CAPS
100.0000 mg | ORAL_CAPSULE | Freq: Two times a day (BID) | ORAL | 0 refills | Status: AC
Start: 2020-02-24 — End: 2020-03-02

## 2020-02-24 MED ORDER — TRIAMCINOLONE ACETONIDE 0.1 % EX CREA
1.0000 | TOPICAL_CREAM | Freq: Two times a day (BID) | CUTANEOUS | 0 refills | Status: AC
Start: 2020-02-24 — End: 2020-03-02

## 2020-02-24 MED ORDER — MUPIROCIN 2 % EX OINT
TOPICAL_OINTMENT | CUTANEOUS | 0 refills | Status: DC
Start: 2020-02-24 — End: 2020-09-18

## 2020-02-24 MED ORDER — TETANUS-DIPHTH-ACELL PERTUSSIS 5-2.5-18.5 LF-MCG/0.5 IM SUSP
0.5000 mL | Freq: Once | INTRAMUSCULAR | Status: AC
  Administered 2020-02-24: 0.5 mL via INTRAMUSCULAR

## 2020-02-24 NOTE — Discharge Instructions (Addendum)
Take medication as prescribed. Rest. Drink plenty of fluids. Continue to monitor.   Follow up with your primary care physician this week as needed. Return to Urgent care for new or worsening concerns.

## 2020-02-24 NOTE — ED Provider Notes (Signed)
[facility] ____________________________________________  Time seen: Approximately 4:31 PM  I have reviewed the triage vital signs and the nursing notes.   HISTORY  Chief Complaint Tick Bite  HPI Kristina Mccoy is a 64 y.o. female past medical history depicted below presenting for reevaluation of left posterior back tick bite.  Patient reports she initially had removed a tick from her left back on 02/13/2020, which she states that she was unsure how long tick was attached.  Patient was then seen the following Monday in her primary office and was given 200 mg prophylactic doxycycline single dose.  Reports she was then seen following Thursday as she had some redness around the tick bite site and a fever with diffuse arthralgias, with temperature 99.9 and was then treated with oral doxycycline for 7 days, and at that time had RMSF and Lyme labs drawn which were negative.  Patient presenting today for continued redness around tick bite site.  Patient states the area is itchy.  Denies pain to the area.  States that she has generalized joint pains at baseline but has noticed a mild increase of her diffuse arthralgias since the tick bite.  Denies atypical headaches, vision changes, chest pain or shortness of breath, vomiting, other rash.  Denies continued fevers, report has had some chills.  Denies other aggravating or alleviating factors.  Continues to overall eat and drink well.  Patient reports she is unsure of her last tetanus immunization and thinks that she is due.  Unable to find documented tetanus immunization in the last 10 years in chart.  [name], MD : PCP   Past Medical History:  Diagnosis Date  . Abdominal pain, epigastric   . Abnormal chest CT 09/08/2014  . Allergic urticaria   . Blood clotting disorder ([place])   . Breast cancer ([place]) 5/14   left breast  . Breast cancer ([place]) 02/2013   Dr Laurance Flatten ([place])  . Diabetes mellitus   . Diverticulosis   .  Fibromyalgia   . GERD (gastroesophageal reflux disease)   . Heart murmur   . Hyperlipidemia   . Lymphedema of arm    right  . Myalgia   . Myositis   . Neuromyositis   . Otitis media, chronic   . Pain syndrome, chronic   . Peptic ulcer   . Sicca syndrome ([place])   . Sjogren's disease ([place])   . Sleep apnea   . SOB (shortness of breath)   . Systemic involvement of connective tissue [facility])     Patient Active Problem List   Diagnosis Date Noted  . Tick bite of back 02/14/2020  . Cardiomyopathy ([place]) 01/13/2020  . History of chest pain 10/20/2019  . Former smoker 08/30/2019  . Restrictive lung disease 08/30/2019  . Foot pain 02/16/2019  . Recurrent sinusitis 01/05/2019  . Hair loss 08/31/2018  . Bilateral leg cramps 05/27/2018  . Memory difficulty 04/27/2018  . Iliotibial band syndrome 04/27/2018  . Respiratory illness 04/27/2018  . Allergic rhinitis 01/26/2018  . RUQ pain 10/28/2017  . Obesity (BMI 30.0-34.9) 10/28/2017  . Pain in limb 06/03/2017  . Chest pain 01/17/2017  . Throat fullness 10/15/2016  . 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 Rash 08/08/2016  . Lower abdominal pain 08/08/2016  . Cephalalgia 01/26/2016  . Dysuria 12/20/2015  . Vitamin D deficiency 12/20/2015  . Sjogren's syndrome ([place]) 12/20/2015  . Chronic fatigue 12/04/2015  . Insomnia 10/19/2015  . Chronic bilateral low back pain without sciatica 11/07/2014  .  OSA on CPAP 10/19/2014  . Anxiety and depression 08/16/2014  . Allergic urticaria 06/02/2014  . Dyspnea 05/04/2014  . Diabetes type 2, uncontrolled ([place]) 07/06/2013  . Malignant neoplasm of breast (female) ([place]) 02/28/2013  . Blood clotting disorder ([place]) 12/01/2012  . Palpitations 12/01/2012  . Hyperlipidemia 12/01/2012  . Chronic pain disorder 06/11/2012  . Hearing loss 09/16/2011  . Fibromyalgia 06/15/2011    Past Surgical History:  Procedure Laterality Date  . BREAST SURGERY Bilateral    mastectomy  . COLONOSCOPY WITH  PROPOFOL N/A 04/22/2017   Procedure: COLONOSCOPY WITH PROPOFOL;  Surgeon: Lollie Sails, MD;  Location[facility] ENDOSCOPY;  Service: Endoscopy;  Laterality: N/A;  . ESOPHAGOGASTRODUODENOSCOPY (EGD) WITH PROPOFOL N/A 04/22/2017   Procedure: ESOPHAGOGASTRODUODENOSCOPY (EGD) WITH PROPOFOL;  Surgeon: Lollie Sails, MD;  Location: [facility] ENDOSCOPY;  Service: Endoscopy;  Laterality: N/A;  . MASTECTOMY Bilateral 03/23/13     No current facility-administered medications for this encounter.  Current Outpatient Medications:  .  albuterol (VENTOLIN HFA) 108 (90 Base) MCG/ACT inhaler, Inhale 2 puffs into the lungs every 4 (four) hours as needed for wheezing or shortness of breath., Disp: 18 g, Rfl: 0 .  ALPRAZolam (XANAX) 0.5 MG tablet, TAKE 1 TABLET (0.5 MG TOTAL) BY MOUTH 4 (FOUR) TIMES DAILY AS NEEDED FOR ANXIETY., Disp: 120 tablet, Rfl: 0 .  aspirin EC 81 MG tablet, Take 1 tablet (81 mg total) by mouth daily., Disp: 90 tablet, Rfl: 3 .  Cholecalciferol (VITAMIN D3) 1000 units CAPS, Take by mouth daily. , Disp: , Rfl:  .  clobetasol ointment (TEMOVATE) 0.05 %, Apply topically. Vaginally, Disp: , Rfl:  .  Continuous Blood Gluc Sensor (FREESTYLE LIBRE 14 DAY SENSOR) MISC, SMARTSIG:1 Each Topical Every 2 Weeks, Disp: , Rfl:  .  Cyanocobalamin 1000 MCG CAPS, Take by mouth. Takes occassionally, Disp: , Rfl:  .  dicyclomine (BENTYL) 10 MG capsule, Take 1 capsule (10 mg total) by mouth 3 (three) times daily as needed for spasms., Disp: 60 capsule, Rfl: 1 .  doxycycline (VIBRA-TABS) 100 MG tablet, Take 1 tablet (100 mg total) by mouth 2 (two) times daily for 10 days., Disp: 20 tablet, Rfl: 0 .  DULoxetine (CYMBALTA) 30 MG capsule, TAKE 1 CAPSULE (30 MG TOTAL) BY MOUTH DAILY FOR 7 DAYS, THEN 2 CAPSULES (60 MG TOTAL) DAILY., Disp: 180 capsule, Rfl: 1 .  Emollient (CERAVE) CREA, Apply topically daily. , Disp: , Rfl:  .  famotidine (PEPCID) 20 MG tablet, Take 1 tablet (20 mg total) by mouth at bedtime for 14  days., Disp: 14 tablet, Rfl: 0 .  fluconazole (DIFLUCAN) 150 MG tablet, Take 1 tablet (150 mg total) by mouth daily. Take one tablet today.  May repeat in 3 days., Disp: 2 tablet, Rfl: 0 .  FREESTYLE LITE test strip, CHECK 6 TIMES DAILY, Disp: 100 each, Rfl: 3 .  glucose blood (ONE TOUCH ULTRA TEST) test strip, CHECK SIX TIMES DAILY, Disp: 100 each, Rfl: 11 .  insulin aspart (NOVOLOG FLEXPEN) 100 UNIT/ML FlexPen, Take up to 2 - 4 units before meals up to 3 times daily, Disp: , Rfl:  .  insulin regular (NOVOLIN R) 100 units/mL injection, Inject 100 Units into the skin 3 (three) times daily before meals., Disp: , Rfl:  .  Insulin Syringe-Needle U-100 (INSULIN SYRINGE .5CC/31GX5/16") 31G X 5/16" 0.5 ML MISC, Use twice daily with injections, Disp: , Rfl:  .  ketoconazole (NIZORAL) 2 % cream, Apply 1 application topically 2 (two) times daily., Disp: , Rfl:  3 .  ketoconazole (NIZORAL) 2 % shampoo, Apply 1 application topically once a week., Disp: , Rfl: 11 .  Lancets MISC, Use. BAYER CONTOUR LANCETS, Disp: , Rfl:  .  metoprolol succinate (TOPROL XL) 25 MG 24 hr tablet, Take 0.5 tablets (12.5 mg total) by mouth daily., Disp: 45 tablet, Rfl: 1 .  nystatin cream (MYCOSTATIN), Apply 1 application topically as needed., Disp: , Rfl:  .  ondansetron (ZOFRAN) 4 MG tablet, TAKE 1 TABLET BY MOUTH EVERY 8 HOURS AS NEEDED FOR NAUSEA AND VOMITING, Disp: 18 tablet, Rfl: 1 .  ONETOUCH DELICA LANCETS 99991111 MISC, TWICE DAILY, Disp: 100 each, Rfl: 3 .  Polyethyl Glycol-Propyl Glycol (SYSTANE OP), Apply to eye., Disp: , Rfl:  .  pseudoephedrine-guaifenesin (MUCINEX D) 60-600 MG 12 hr tablet, Take 1 tablet by mouth 2 (two) times daily as needed. , Disp: , Rfl:  .  Tiotropium Bromide Monohydrate (SPIRIVA RESPIMAT) 1.25 MCG/ACT AERS, Inhale 2 puffs into the lungs daily., Disp: 1 g, Rfl: 3 .  traMADol (ULTRAM) 50 MG tablet, TAKE 1 TABLET BY MOUTH EVERY 6 HOURS AS NEEDED. FOR PAIN, Disp: 120 tablet, Rfl: 1 .  TRESIBA FLEXTOUCH  100 UNIT/ML SOPN FlexTouch Pen, 20 Units at bedtime., Disp: , Rfl: 5 .  triamcinolone (NASACORT ALLERGY 24HR) 55 MCG/ACT AERO nasal inhaler, Place 2 sprays into the nose daily., Disp: , Rfl:  .  doxycycline (VIBRAMYCIN) 100 MG capsule, Take 1 capsule (100 mg total) by mouth 2 (two) times daily for 7 days., Disp: 14 capsule, Rfl: 0 .  mupirocin ointment (BACTROBAN) 2 %, Apply two times a day for 7 days., Disp: 22 g, Rfl: 0 .  triamcinolone cream (KENALOG) 0.1 %, Apply 1 application topically 2 (two) times daily for 7 days., Disp: 30 g, Rfl: 0  Allergies Gabapentin, Oxycodone, Carbamazepine, Dulaglutide, Insulin glargine, Levaquin [levofloxacin], Metformin, Methylprednisolone, Morphine, Pregabalin, Venlafaxine, Buprenorphine, Canagliflozin, Escitalopram, and Hydroxychloroquine  Family History  Problem Relation Age of Onset  . Diabetes Mother   . Heart disease Mother   . Hyperlipidemia Mother   . Heart failure Mother   . Diabetes Other   . Heart disease Brother        Aortic valve disease  . Hyperlipidemia Brother   . Colon polyps Brother   . Stroke Maternal Grandmother   . Breast cancer Maternal Aunt   . Colon cancer Paternal Grandmother   . Rheum arthritis Sister   . Psoriasis Other        psoriatic arthritis   . Heart disease Brother 51       Tachycardia  . Heart disease Brother   . Ovarian cancer Neg Hx     Social History Social History   Tobacco Use  . Smoking status: Former Smoker    Packs/day: 1.00    Years: 30.00    Pack years: 30.00    Types: Cigarettes    Quit date: 02/28/2013    Years since quitting: 6.9  . Smokeless tobacco: Never Used  Substance Use Topics  . Alcohol use: No    Alcohol/week: 0.0 standard drinks  . Drug use: No    Review of Systems Constitutional: No fever/chills Eyes: No visual changes. ENT: No sore throat. Cardiovascular: Denies chest pain. Respiratory: Denies shortness of breath. Gastrointestinal: No abdominal pain.  No nausea, no  vomiting.  Musculoskeletal: Negative for back pain. Skin: Positive rash. Neurological: Negative for  focal weakness or numbness.   ____________________________________________   PHYSICAL EXAM:  VITAL SIGNS: ED Triage Vitals  Enc Vitals Group     BP 02/24/20 1545 109/70     Pulse Rate 02/24/20 1545 79     Resp 02/24/20 1545 18     Temp 02/24/20 1545 97.9 F (36.6 C)     Temp Source 02/24/20 1545 Oral     SpO2 02/24/20 1545 100 %     Weight 02/24/20 1543 149 lb 14.6 oz (68 kg)     Height 02/24/20 1543 5\' 4"  (1.626 m)     Head Circumference --      Peak Flow --      Pain Score 02/24/20 1542 2     Pain Loc --      Pain Edu? --      Excl. in [place]? --     Constitutional: Alert and oriented. Well appearing and in no acute distress. Eyes: Conjunctivae are normal.  ENT      Head: Normocephalic and atraumatic. Cardiovascular: Normal rate, regular rhythm. Grossly normal heart sounds.  Good peripheral circulation. Respiratory: Normal respiratory effort without tachypnea nor retractions. Breath sounds are clear and equal bilaterally. No wheezes, rales, rhonchi. Musculoskeletal: Steady gait.  Neurologic:  Normal speech and language. Speech is normal. No gait instability.  Skin:  Skin is warm, dry  Except:     Left posterior back rash as depicted above, approximately 3 cm area of erythema surrounding tick bite site, no foreign body noted, mild induration, no fluctuance, no drainage, pruritic, no central clearing.  No further surrounding erythema.   Psychiatric: Mood and affect are normal. Speech and behavior are normal. Patient exhibits appropriate insight and judgment   ___________________________________________   LABS (all labs ordered are listed, but only abnormal results are displayed)  Labs Reviewed  ROCKY MTN SPOTTED FVR ABS PNL(IGG+IGM)  B. BURGDORFI ANTIBODIES    PROCEDURES Procedures     INITIAL IMPRESSION / ASSESSMENT AND PLAN / ED COURSE  Pertinent labs &  imaging results that were available during my care of the patient were reviewed by me and considered in my medical decision making (see chart for details).  Of note, previous urgent care visit note reviewed.  Well-appearing patient.  No acute distress.  Tick bite to left posterior back as above, patient does have mild cellulitis at this time with local inflammation to area.  Tetanus immunization updated.  Patient's first concern of Lyme as well as RMSF, has had these labs drawn within 1 week after tick bite, will repeat these labs today.  We will continue doxycycline for 7 more days as well as prescribed topical Bactroban and topical triamcinolone.  Continue to monitor and supportive care.Discussed indication, risks and benefits of medications with patient.   Discussed follow up with Primary care physician this week. Discussed follow up and return parameters including no resolution or any worsening concerns. Patient verbalized understanding and agreed to plan.   ____________________________________________   FINAL CLINICAL IMPRESSION(S) / ED DIAGNOSES  Final diagnoses:  Cellulitis of back  Tick bite, initial encounter     ED Discharge Orders         Ordered    doxycycline (VIBRAMYCIN) 100 MG capsule  2 times daily     02/24/20 1642    triamcinolone cream (KENALOG) 0.1 %  2 times daily     02/24/20 1642    mupirocin ointment (BACTROBAN) 2 %     02/24/20 1642           Note: This dictation was prepared with Dragon dictation along with smaller phrase technology.  Any transcriptional errors that result from this process are unintentional.         Marylene Land, NP 02/24/20 1910

## 2020-02-24 NOTE — ED Triage Notes (Signed)
Patient states that she pulled a tick on 05/16. States that she had all the tick labs that were normal. Patient states that she was seen here last week and given Doxycycline. Patient states that she feels like the redness has started to spread and she was concerned. Patient states that her PCP told her to be concerned for cellulitis.

## 2020-02-25 ENCOUNTER — Other Ambulatory Visit: Payer: PPO

## 2020-02-25 ENCOUNTER — Other Ambulatory Visit (INDEPENDENT_AMBULATORY_CARE_PROVIDER_SITE_OTHER): Payer: PPO

## 2020-02-25 ENCOUNTER — Other Ambulatory Visit: Payer: Self-pay

## 2020-02-25 DIAGNOSIS — E871 Hypo-osmolality and hyponatremia: Secondary | ICD-10-CM | POA: Diagnosis not present

## 2020-02-25 DIAGNOSIS — M791 Myalgia, unspecified site: Secondary | ICD-10-CM | POA: Diagnosis not present

## 2020-02-25 LAB — BASIC METABOLIC PANEL
BUN: 16 mg/dL (ref 6–23)
CO2: 29 mEq/L (ref 19–32)
Calcium: 8.8 mg/dL (ref 8.4–10.5)
Chloride: 96 mEq/L (ref 96–112)
Creatinine, Ser: 0.72 mg/dL (ref 0.40–1.20)
GFR: 82.05 mL/min (ref >60.00)
Glucose, Bld: 226 mg/dL — ABNORMAL HIGH (ref 70–99)
Potassium: 4.1 mEq/L (ref 3.5–5.1)
Sodium: 132 mEq/L — ABNORMAL LOW (ref 135–145)

## 2020-02-25 LAB — B. BURGDORFI ANTIBODIES: B burgdorferi Ab IgG+IgM: <0.91 {ISR} (ref 0.00–0.90)

## 2020-02-25 LAB — ROCKY MTN SPOTTED FVR ABS PNL(IGG+IGM)
RMSF IgG: NEGATIVE
RMSF IgM: 0.47 index (ref 0.00–0.89)

## 2020-02-28 DIAGNOSIS — R002 Palpitations: Secondary | ICD-10-CM | POA: Diagnosis not present

## 2020-02-29 ENCOUNTER — Telehealth: Payer: Self-pay | Admitting: Family Medicine

## 2020-02-29 LAB — ALDOLASE: Aldolase: 13.9 U/L — ABNORMAL HIGH (ref <=8.1)

## 2020-02-29 NOTE — Telephone Encounter (Signed)
Pt called in stated that her medication was not called in to pharmacy [facility]

## 2020-03-03 NOTE — Telephone Encounter (Signed)
I called and verified that the patient picked up her medication.  Nakaya Mishkin,cma

## 2020-03-13 ENCOUNTER — Other Ambulatory Visit: Payer: Self-pay

## 2020-03-13 ENCOUNTER — Encounter: Payer: Self-pay | Admitting: Family

## 2020-03-13 ENCOUNTER — Ambulatory Visit: Payer: PPO | Admitting: Family

## 2020-03-13 VITALS — BP 112/68 | HR 86 | Ht 64.0 in | Wt 156.0 lb

## 2020-03-13 DIAGNOSIS — E871 Hypo-osmolality and hyponatremia: Secondary | ICD-10-CM

## 2020-03-13 DIAGNOSIS — R002 Palpitations: Secondary | ICD-10-CM

## 2020-03-13 DIAGNOSIS — I429 Cardiomyopathy, unspecified: Secondary | ICD-10-CM | POA: Diagnosis not present

## 2020-03-13 NOTE — Progress Notes (Signed)
Office Visit    Patient Name: Kristina Mccoy Date of Encounter: 03/13/2020  Primary Care Provider:  [name], MD Primary Cardiologist:  Nelva Bush, MD Electrophysiologist:  None   Chief Complaint    Kristina Mccoy is a 64 y.o. female with a hx of Sjogren's disease with sicca syndrome, HLD, DM2, fibromyalgia, neuromyeocystitis, sleep apnea on CPAP presents today for follow-up after ZIO monitor and stress testing  Past Medical History    Past Medical History:  Diagnosis Date  . Abdominal pain, epigastric   . Abnormal chest CT 09/08/2014  . Allergic urticaria   . Blood clotting disorder ([place])   . Breast cancer ([place]) 5/14   left breast  . Breast cancer ([place]) 02/2013   Dr Laurance Flatten ([place])  . Diabetes mellitus   . Diverticulosis   . Fibromyalgia   . GERD (gastroesophageal reflux disease)   . Heart murmur   . Hyperlipidemia   . Lymphedema of arm    right  . Myalgia   . Myositis   . Neuromyositis   . Otitis media, chronic   . Pain syndrome, chronic   . Peptic ulcer   . Sicca syndrome ([place])   . Sjogren's disease ([place])   . Sleep apnea   . SOB (shortness of breath)   . Systemic involvement of connective tissue [facility])    Past Surgical History:  Procedure Laterality Date  . BREAST SURGERY Bilateral    mastectomy  . COLONOSCOPY WITH PROPOFOL N/A 04/22/2017   Procedure: COLONOSCOPY WITH PROPOFOL;  Surgeon: Lollie Sails, MD;  Location[facility] ENDOSCOPY;  Service: Endoscopy;  Laterality: N/A;  . ESOPHAGOGASTRODUODENOSCOPY (EGD) WITH PROPOFOL N/A 04/22/2017   Procedure: ESOPHAGOGASTRODUODENOSCOPY (EGD) WITH PROPOFOL;  Surgeon: Lollie Sails, MD;  Location: [facility] ENDOSCOPY;  Service: Endoscopy;  Laterality: N/A;  . MASTECTOMY Bilateral 03/23/13    Allergies  Allergies  Allergen Reactions  . Gabapentin Itching and Swelling  . Oxycodone Itching and Rash  . Carbamazepine Nausea And Vomiting and Other (See Comments)    Abdominal pain   .  Dulaglutide     Other reaction(s): Other (See Comments) Severe stomach pain  . Insulin Glargine Other (See Comments)    BLOATING, MUSCLE/JOINT PAIN  . Levaquin [Levofloxacin]     Diarrhea and pain in calf  . Metformin     Other reaction(s): Other (See Comments) Cramping- abdominal pain  . Methylprednisolone     Reddened face, puffy face   . Morphine Other (See Comments)  . Pregabalin Swelling    Swelling in arms and legs  . Venlafaxine Other (See Comments)  . Buprenorphine Rash and Swelling  . Canagliflozin Other (See Comments) and Rash    YEAST INFECTIONS  . Escitalopram Rash  . Hydroxychloroquine Rash    History of Present Illness    Kristina Mccoy is a 64 y.o. female with a hx of Sjogren's disease with sicca syndrome, HLD, DM2, fibromyalgia, neuromyeocystitis, sleep apnea on CPAP.  She was last seen 01/12/2020 by Dr. Saunders Revel.  Initial cardiology evaluation February 2021 for evaluation of worsening chest pain and shortness of breath.  Symptoms have been present for over than 10 years resulting multiple work-ups.  Echo 11/2019 notable for mildly reduced LVEF 45-50%.  She was started metoprolol succinate 12.5 mg daily which she subsequently discontinued due to low blood pressure and dizziness.  When seen in clinic 01/12/20 she was recommended for Lexiscan Myoview and ZIO monitor. ZIO with predominantly NSR, rare PVC/PAC. Triggered events associated with  SR or PVC.  Testing reviewed in depth during office visit today.  Has recently completed course of antibiotics due to tick bite and subsequent cellulitis.  Tells me when her heart is racing she will notice pressure or tightness at that time she will notice shortness of breath.  Continues to occur daily.  Following with her primary care regarding hyponatremia.  We discussed the role of free water restrictions.  She is drinking approximately 2 glasses of water and 2 glasses of tea per day.  We discussed the free water restriction was of a  stronger benefit than increasing sodium.  Reports no chest pain, pressure, tightness.  Reports no shortness of breath at rest.  Reports her dyspnea on exertion is stable at baseline.  EKGs/Labs/Other Studies Reviewed:   The following studies were reviewed today:  TTE (12/21/2019): Normal LV size with mild LVH.  LVEF 45-50% with global hypokinesis and normal diastolic function.  Normal RV size and function.  Trivial mitral regurgitation.  Normal CVP. ABIs (12/21/2019): Normal.  ZIO 01/28/20  The patient was monitored for 13 days, 9 hours.  The predominant rhythm was sinus with an average rate of 83 bpm (range 57-146 bpm).  There were rare PAC's and PVC's.  No sustained arrhythmia or prolonged pause was observed.  Patient triggered events correspond to sinus rhythm and PVC's.   Predominantly sinus rhythm with rare PAC's and PVC's.   Lexiscan Myoview 02/14/20 T wave inversion was noted during stress in the II, III, I, aVF, V4, V5 and V6 leads. There was no ST segment deviation noted during stress. Defect 1: There is a small defect of moderate severity present in the mid anterior, apical anterior and apex location. This is likely due to breast attenuation artifact The study is normal. This is a low risk study. The left ventricular ejection fraction is hyperdynamic (>65%). Suboptimal study due to GI uptake and breast attenuation.  EKG:  No EKG today.  Recent Labs: 08/17/2019: Magnesium 1.9 02/15/2020: ALT 38; TSH 0.57 02/25/2020: BUN 16; Creatinine, Ser 0.72; Potassium 4.1; Sodium 132  Recent Lipid Panel    Component Value Date/Time   CHOL 215 (H) 04/27/2018 1419   CHOL 113 09/03/2014 0352   TRIG 118.0 04/27/2018 1419   TRIG 107 09/03/2014 0352   HDL 48.00 04/27/2018 1419   HDL 16 (L) 09/03/2014 0352   CHOLHDL 4 04/27/2018 1419   VLDL 23.6 04/27/2018 1419   VLDL 21 09/03/2014 0352   LDLCALC 144 (H) 04/27/2018 1419   LDLCALC 76 09/03/2014 0352   LDLDIRECT 111.0 04/18/2015  1110    Home Medications   Current Meds  Medication Sig  . albuterol (VENTOLIN HFA) 108 (90 Base) MCG/ACT inhaler Inhale 2 puffs into the lungs every 4 (four) hours as needed for wheezing or shortness of breath.  . ALPRAZolam (XANAX) 0.5 MG tablet TAKE 1 TABLET (0.5 MG TOTAL) BY MOUTH 4 (FOUR) TIMES DAILY AS NEEDED FOR ANXIETY.  Marland Kitchen aspirin EC 81 MG tablet Take 1 tablet (81 mg total) by mouth daily.  . Cholecalciferol (VITAMIN D3) 1000 units CAPS Take by mouth daily.   . clobetasol ointment (TEMOVATE) 0.05 % Apply topically. Vaginally  . Continuous Blood Gluc Sensor (FREESTYLE LIBRE 14 DAY SENSOR) MISC SMARTSIG:1 Each Topical Every 2 Weeks  . Cyanocobalamin 1000 MCG CAPS Take by mouth. Takes occassionally  . dicyclomine (BENTYL) 10 MG capsule Take 1 capsule (10 mg total) by mouth 3 (three) times daily as needed for spasms.  . DULoxetine (CYMBALTA) 30 MG  capsule TAKE 1 CAPSULE (30 MG TOTAL) BY MOUTH DAILY FOR 7 DAYS, THEN 2 CAPSULES (60 MG TOTAL) DAILY.  Marland Kitchen Emollient (CERAVE) CREA Apply topically daily.   . fluconazole (DIFLUCAN) 150 MG tablet Take 1 tablet (150 mg total) by mouth daily. Take one tablet today.  May repeat in 3 days.  Marland Kitchen FREESTYLE LITE test strip CHECK 6 TIMES DAILY  . glucose blood (ONE TOUCH ULTRA TEST) test strip CHECK SIX TIMES DAILY  . insulin aspart (NOVOLOG FLEXPEN) 100 UNIT/ML FlexPen Take up to 2 - 4 units before meals up to 3 times daily  . insulin regular (NOVOLIN R) 100 units/mL injection Inject 100 Units into the skin 3 (three) times daily before meals.  . Insulin Syringe-Needle U-100 (INSULIN SYRINGE .5CC/31GX5/16") 31G X 5/16" 0.5 ML MISC Use twice daily with injections  . ketoconazole (NIZORAL) 2 % cream Apply 1 application topically 2 (two) times daily.  Marland Kitchen ketoconazole (NIZORAL) 2 % shampoo Apply 1 application topically once a week.  . Lancets MISC Use. BAYER CONTOUR LANCETS  . mupirocin ointment (BACTROBAN) 2 % Apply two times a day for 7 days.  Marland Kitchen nystatin  cream (MYCOSTATIN) Apply 1 application topically as needed.  . ondansetron (ZOFRAN) 4 MG tablet TAKE 1 TABLET BY MOUTH EVERY 8 HOURS AS NEEDED FOR NAUSEA AND VOMITING  . ONETOUCH DELICA LANCETS 13Y MISC TWICE DAILY  . Polyethyl Glycol-Propyl Glycol (SYSTANE OP) Apply to eye.  . pseudoephedrine-guaifenesin (MUCINEX D) 60-600 MG 12 hr tablet Take 1 tablet by mouth 2 (two) times daily as needed.   . Tiotropium Bromide Monohydrate (SPIRIVA RESPIMAT) 1.25 MCG/ACT AERS Inhale 2 puffs into the lungs daily.  . traMADol (ULTRAM) 50 MG tablet TAKE 1 TABLET BY MOUTH EVERY 6 HOURS AS NEEDED. FOR PAIN  . TRESIBA FLEXTOUCH 100 UNIT/ML SOPN FlexTouch Pen 20 Units at bedtime.  . triamcinolone (NASACORT ALLERGY 24HR) 55 MCG/ACT AERO nasal inhaler Place 2 sprays into the nose daily.      Review of Systems      Review of Systems  Constitutional: Negative for chills, fever and malaise/fatigue.  Cardiovascular: Positive for dyspnea on exertion and palpitations. Negative for chest pain, leg swelling, near-syncope, orthopnea and syncope.  Respiratory: Negative for cough, shortness of breath and wheezing.   Gastrointestinal: Negative for nausea and vomiting.  Neurological: Negative for dizziness, light-headedness and weakness.   All other systems reviewed and are otherwise negative except as noted above.  Physical Exam    VS:  BP 112/68 (BP Location: Left Arm, Patient Position: Sitting, Cuff Size: Normal)   Pulse 86   Ht 5\' 4"  (1.626 m)   Wt 156 lb (70.8 kg)   SpO2 98%   BMI 26.78 kg/m  , BMI Body mass index is 26.78 kg/m. GEN: Well nourished, well developed, in no acute distress. HEENT: normal. Neck: Supple, no JVD, carotid bruits, or masses. Cardiac: RRR, no murmurs, rubs, or gallops. No clubbing, cyanosis, edema.  Radials/DP/PT 2+ and equal bilaterally.  Respiratory:  Respirations regular and unlabored, clear to auscultation bilaterally. GI: Soft, nontender, nondistended, BS + x 4. MS: No deformity  or atrophy. Skin: Warm and dry, no rash. Neuro:  Strength and sensation are intact. Psych: Normal affect.  Assessment & Plan    1. Cardiomyopathy, dyspnea on exertion - Recent echo with mildly reduced LVEF with global hypokinesis. Subsequent stress testing low risk. No recurrent chest pain. No further ischemic evaluation at this time. 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 Intolerant of metoprolol with lightheadedness  and fatigue. Soft BP precludes addition of ACE/ARB. No volume overload noted on exam. Recommend low sodium diet, free water restriction. Will discuss recommendations for further guideline directed therapy of her NICM with her primary cardiologist as options limited by low BP.  2. Palpitations - Recent ZIO with predominantly NSR and infrequent PVC/PAC. Trigered events associated with NSR and PVCs. Avoids caffeine, alcohol. No noted proarrhythmic medications. We discussed PRN Metoprolol tartrate for breakthrough palpitations which she politely declines.  3. Hyponatremia - Following with her PCP. Recommend free water restriction. Would not recommend increased sodium intake in setting of her NICM.   Disposition: Follow up in 3 month(s) with Dr. Saunders Revel or APP   Loel Dubonnet, NP 03/13/2020, 7:59 PM

## 2020-03-13 NOTE — Patient Instructions (Signed)
Medication Instructions:  No medication changes today.   *If you need a refill on your cardiac medications before your next appointment, please call your pharmacy*   Lab Work: No lab work today.  If you have labs (blood work) drawn today and your tests are completely normal, you will receive your results only by: Marland Kitchen MyChart Message (if you have MyChart) OR . A paper copy in the mail If you have any lab test that is abnormal or we need to change your treatment, we will call you to review the results.   Testing/Procedures: Your ZIO monitor showed normal sinus rhythm with occasional early beats in the bottom chambers of your heart (PVCs) these are not dangerous.   Your stress test was low risk.   Follow-Up: At [facility], you and your health needs are our priority.  As part of our continuing mission to provide you with exceptional heart care, we have created designated Provider Care Teams.  These Care Teams include your primary Cardiologist (physician) and Advanced Practice Providers (APPs -  Physician Assistants and Nurse Practitioners) who all work together to provide you with the care you need, when you need it.  We recommend signing up for the patient portal called "MyChart".  Sign up information is provided on this After Visit Summary.  MyChart is used to connect with patients for Virtual Visits (Telemedicine).  Patients are able to view lab/test results, encounter notes, upcoming appointments, etc.  Non-urgent messages can be sent to your provider as well.   To learn more about what you can do with MyChart, go to [email or web address].    Your next appointment:   3 month(s)  The format for your next appointment:   In Person  Provider:    You may see Nelva Bush, MD or one of the following Advanced Practice Providers on your designated Care Team:    Murray Hodgkins, NP  Christell Faith, PA-C  Marrianne Mood, PA-C   Other Instructions   Premature Ventricular  Contraction  A premature ventricular contraction (PVC) is a common kind of irregular heartbeat (arrhythmia). These contractions are extra heartbeats that start in the ventricles of the heart and occur too early in the normal sequence. During the PVC, the heart's normal electrical pathway is not used, so the beat is shorter and less effective. In most cases, these contractions come and go and do not require treatment. What are the causes? Common causes of the condition include:  Smoking.  Drinking alcohol.  Certain medicines.  Some illegal drugs.  Stress.  Caffeine. Certain medical conditions can also cause PVCs:  Heart failure.  Heart attack, or coronary artery disease.  Heart valve problems.  Changes in minerals in the blood (electrolytes).  Low blood oxygen levels or high carbon dioxide levels. In many cases, the cause of this condition is not known. What are the signs or symptoms? The main symptom of this condition is fast or skipped heartbeats (palpitations). Other symptoms include:  Chest pain.  Shortness of breath.  Feeling tired.  Dizziness.  Difficulty exercising. In some cases, there are no symptoms. How is this diagnosed? This condition may be diagnosed based on:  Your medical history.  A physical exam. During the exam, the health care provider will check for irregular heartbeats.  Tests, such as: ? An ECG (electrocardiogram) to monitor the electrical activity of your heart. ? An ambulatory cardiac monitor. This device records your heartbeats for 24 hours or more. ? Stress tests to see how exercise affects your  heart rhythm and blood supply. ? An echocardiogram. This test uses sound waves (ultrasound) to produce an image of your heart. ? An electrophysiology study (EPS). This test checks for electrical problems in your heart. How is this treated? Treatment for this condition depends on any underlying conditions, the type of PVCs that you are having,  and how much the symptoms are interfering with your daily life. Possible treatments include:  Avoiding things that cause premature contractions (triggers). These include caffeine and alcohol.  Taking medicines if symptoms are severe or if the extra heartbeats are frequent.  Getting treatment for underlying conditions that cause PVCs. In some cases, no treatment is required. Follow these instructions at home: Lifestyle  Do not use any products that contain nicotine or tobacco, such as cigarettes, e-cigarettes, and chewing tobacco. If you need help quitting, ask your health care provider.  Do not use illegal drugs.  Exercise regularly. Ask your health care provider what type of exercise is safe for you.  Try to get at least 7-9 hours of sleep each night, or as much as recommended by your health care provider.  Find healthy ways to manage stress. Avoid stressful situations when possible. Alcohol use  Do not drink alcohol if: ? Your health care provider tells you not to drink. ? You are pregnant, may be pregnant, or are planning to become pregnant. ? Alcohol triggers your episodes.  If you drink alcohol: ? Limit how much you use to:  0-1 drink a day for women.  0-2 drinks a day for men.  Be aware of how much alcohol is in your drink. In the [place] one drink equals one 12 oz bottle of beer (355 mL), one 5 oz glass of wine (148 mL), or one 1 oz glass of hard liquor (44 mL). General instructions  Take over-the-counter and prescription medicines only as told by your health care provider.  If caffeine triggers episodes of PVC, do not eat, drink, or use anything with caffeine in it.  Keep all follow-up visits as told by your health care provider. This is important. Contact a health care provider if you:  Feel palpitations. Get help right away if you:  Have chest pain.  Have shortness of breath.  Have sweating for no reason.  Have nausea and vomiting.  Become light-headed  or you faint. Summary  A premature ventricular contraction (PVC) is a common kind of irregular heartbeat (arrhythmia).  In most cases, these contractions come and go and do not require treatment.  You may need to wear an ambulatory cardiac monitor. This records your heartbeats for 24 hours or more.  Treatment depends on any underlying conditions, the type of PVCs that you are having, and how much the symptoms are interfering with your daily life. This information is not intended to replace advice given to you by your health care provider. Make sure you discuss any questions you have with your health care provider. Document Revised: 06/11/2018 Document Reviewed: 06/11/2018 Elsevier Patient Education  2020 [employer].

## 2020-03-27 ENCOUNTER — Encounter: Payer: Self-pay | Admitting: Family Medicine

## 2020-03-27 ENCOUNTER — Telehealth: Payer: Self-pay | Admitting: Family Medicine

## 2020-03-27 NOTE — Telephone Encounter (Signed)
-----   Message from Elayne Guerin, [facility] sent at 03/24/2020  1:48 PM EDT ----- Regarding: SUPD Measure Hi Dr. Biagio Quint,  I am a [facility] clinical pharmacist that reviews patients for quality initiatives.    Per review of chart and payor information, patient has a diagnosis of diabetes but is not currently prescribed a statin.     -I could not find any documentation of previous trial of a statin or a history of statin intolerance.  However, there was a lot of discussion about the potential of starting a statin and some discussion of holding off due to the patient's history of joint pain.  10-year ASCVD risk:  6.8%  She does not have an upcoming appointment with you.  She does have an appointment to see Cardiology 06/14/20  Please consider the following recommendations:  1) Consider trial of statin therapy. GDMT recommends at least a moderate intensity level statin in patients with diabetes and ASCVD risk > 7.5%.     Examples of moderate intensity statin therapy include the following:  Atorvastatin 10mg  once daily #90 3 refills Rosuvastatin 5mg  once daily #90 3 refills Simvastatin 20mg  once daily #90 3 refills Pravastatin 40mg  once daily #90 3 refills Lovastatin 40mg  once daily #90 3 refills  Fluvastatin 40mg  BID vs XL 80mg  once daily #90 3 refills  ------------------------------------------------------------------------------------  2) If intolerance is a concern, could consider a once, twice or three times weekly regimen as follows:  -Once weekly #13 (90DS) #3 refills -Twice weekly #26 (90DS) 3 refills -Three times weekly #39 (90DS) #3 refills  -----------------------------------------------------------------------------------  3) If appropriate, consider adding one the following SUPD exclusion CPT codes.  This is not an all inclusive list.   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   Thank you!   Elayne Guerin, PharmD, [place] Clinical Pharmacist [phone]

## 2020-03-27 NOTE — Telephone Encounter (Signed)
I called and scheduled the patient a office visit withe provider at his request in 1 month.  Gladys Gutman,cma

## 2020-03-27 NOTE — Telephone Encounter (Signed)
Please make sure the patient is set up with a visit to discuss her cholesterol in the next month or so. Thanks.

## 2020-03-29 ENCOUNTER — Other Ambulatory Visit: Payer: Self-pay | Admitting: Family Medicine

## 2020-04-08 ENCOUNTER — Other Ambulatory Visit: Payer: Self-pay

## 2020-04-08 ENCOUNTER — Ambulatory Visit
Admission: EM | Admit: 2020-04-08 | Discharge: 2020-04-08 | Disposition: A | Discharge status: Home / Self Care | Payer: PPO | Attending: Emergency Medicine | Admitting: Emergency Medicine

## 2020-04-08 ENCOUNTER — Encounter: Payer: Self-pay | Admitting: Emergency Medicine

## 2020-04-08 DIAGNOSIS — R519 Headache, unspecified: Secondary | ICD-10-CM | POA: Insufficient documentation

## 2020-04-08 DIAGNOSIS — Z20822 Contact with and (suspected) exposure to covid-19: Secondary | ICD-10-CM | POA: Diagnosis not present

## 2020-04-08 LAB — SARS CORONAVIRUS 2 (TAT 6-24 HRS): SARS Coronavirus 2: NEGATIVE

## 2020-04-08 NOTE — Discharge Instructions (Signed)
Person Under Monitoring Name: Kristina Mccoy  Location: [place] [address]   Infection Prevention Recommendations for Individuals Confirmed to have, or Being Evaluated for, 2019 Novel Coronavirus (COVID-19) Infection Who Receive Care at Home  Individuals who are confirmed to have, or are being evaluated for, COVID-19 should follow the prevention steps below until a healthcare provider or local or state health department says they can return to normal activities.  Stay home except to get medical care You should restrict activities outside your home, except for getting medical care. Do not go to work, school, or public areas, and do not use public transportation or taxis.  Call ahead before visiting your doctor Before your medical appointment, call the healthcare provider and tell them that you have, or are being evaluated for, COVID-19 infection. This will help the healthcare provider's office take steps to keep other people from getting infected. Ask your healthcare provider to call the local or state health department.  Monitor your symptoms Seek prompt medical attention if your illness is worsening (e.g., difficulty breathing). Before going to your medical appointment, call the healthcare provider and tell them that you have, or are being evaluated for, COVID-19 infection. Ask your healthcare provider to call the local or state health department.  Wear a facemask You should wear a facemask that covers your nose and mouth when you are in the same room with other people and when you visit a healthcare provider. People who live with or visit you should also wear a facemask while they are in the same room with you.  Separate yourself from other people in your home As much as possible, you should stay in a different room from other people in your home. Also, you should use a separate bathroom, if available.  Avoid sharing household items You should not share  dishes, drinking glasses, cups, eating utensils, towels, bedding, or other items with other people in your home. After using these items, you should wash them thoroughly with soap and water.  Cover your coughs and sneezes Cover your mouth and nose with a tissue when you cough or sneeze, or you can cough or sneeze into your sleeve. Throw used tissues in a lined trash can, and immediately wash your hands with soap and water for at least 20 seconds or use an alcohol-based hand rub.  Wash your [facility] your hands often and thoroughly with soap and water for at least 20 seconds. You can use an alcohol-based hand sanitizer if soap and water are not available and if your hands are not visibly dirty. Avoid touching your eyes, nose, and mouth with unwashed hands.   Prevention Steps for Caregivers and Household Members of Individuals Confirmed to have, or Being Evaluated for, COVID-19 Infection Being Cared for in the Home  If you live with, or provide care at home for, a person confirmed to have, or being evaluated for, COVID-19 infection please follow these guidelines to prevent infection:  Follow healthcare provider's instructions Make sure that you understand and can help the patient follow any healthcare provider instructions for all care.  Provide for the patient's basic needs You should help the patient with basic needs in the home and provide support for getting groceries, prescriptions, and other personal needs.  Monitor the patient's symptoms If they are getting sicker, call his or her medical provider and tell them that the patient has, or is being evaluated for, COVID-19 infection. This will help the healthcare provider's  office take steps to keep other people from getting infected. Ask the healthcare provider to call the local or state health department.  Limit the number of people who have contact with the patient If possible, have only one caregiver for the patient. Other  household members should stay in another home or place of residence. If this is not possible, they should stay in another room, or be separated from the patient as much as possible. Use a separate bathroom, if available. Restrict visitors who do not have an essential need to be in the home.  Keep older adults, very Alonge children, and other sick people away from the patient Keep older adults, very Gage children, and those who have compromised immune systems or chronic health conditions away from the patient. This includes people with chronic heart, lung, or kidney conditions, diabetes, and cancer.  Ensure good ventilation Make sure that shared spaces in the home have good air flow, such as from an air conditioner or an opened window, weather permitting.  Wash your hands often Wash your hands often and thoroughly with soap and water for at least 20 seconds. You can use an alcohol based hand sanitizer if soap and water are not available and if your hands are not visibly dirty. Avoid touching your eyes, nose, and mouth with unwashed hands. Use disposable paper towels to dry your hands. If not available, use dedicated cloth towels and replace them when they become wet.  Wear a facemask and gloves Wear a disposable facemask at all times in the room and gloves when you touch or have contact with the patient's blood, body fluids, and/or secretions or excretions, such as sweat, saliva, sputum, nasal mucus, vomit, urine, or feces.  Ensure the mask fits over your nose and mouth tightly, and do not touch it during use. Throw out disposable facemasks and gloves after using them. Do not reuse. Wash your hands immediately after removing your facemask and gloves. If your personal clothing becomes contaminated, carefully remove clothing and launder. Wash your hands after handling contaminated clothing. Place all used disposable facemasks, gloves, and other waste in a lined container before disposing them with  other household waste. Remove gloves and wash your hands immediately after handling these items.  Do not share dishes, glasses, or other household items with the patient Avoid sharing household items. You should not share dishes, drinking glasses, cups, eating utensils, towels, bedding, or other items with a patient who is confirmed to have, or being evaluated for, COVID-19 infection. After the person uses these items, you should wash them thoroughly with soap and water.  Wash laundry thoroughly Immediately remove and wash clothes or bedding that have blood, body fluids, and/or secretions or excretions, such as sweat, saliva, sputum, nasal mucus, vomit, urine, or feces, on them. Wear gloves when handling laundry from the patient. Read and follow directions on labels of laundry or clothing items and detergent. In general, wash and dry with the warmest temperatures recommended on the label.  Clean all areas the individual has used often Clean all touchable surfaces, such as counters, tabletops, doorknobs, bathroom fixtures, toilets, phones, keyboards, tablets, and bedside tables, every day. Also, clean any surfaces that may have blood, body fluids, and/or secretions or excretions on them. Wear gloves when cleaning surfaces the patient has come in contact with. Use a diluted bleach solution (e.g., dilute bleach with 1 part bleach and 10 parts water) or a household disinfectant with a label that says EPA-registered for coronaviruses. To make a  bleach solution at home, add 1 tablespoon of bleach to 1 quart (4 cups) of water. For a larger supply, add  cup of bleach to 1 gallon (16 cups) of water. Read labels of cleaning products and follow recommendations provided on product labels. Labels contain instructions for safe and effective use of the cleaning product including precautions you should take when applying the product, such as wearing gloves or eye protection and making sure you have good ventilation  during use of the product. Remove gloves and wash hands immediately after cleaning.  Monitor yourself for signs and symptoms of illness Caregivers and household members are considered close contacts, should monitor their health, and will be asked to limit movement outside of the home to the extent possible. Follow the monitoring steps for close contacts listed on the symptom monitoring form.   ? If you have additional questions, contact your local health department or call the epidemiologist on call at [phone] (available 24/7). ? This guidance is subject to change. For the most up-to-date guidance from [facility], please refer to their website: [email or web address]

## 2020-04-08 NOTE — ED Provider Notes (Signed)
[facility] - [place], [place]   Name: Kristina Mccoy DOB: 04-13-1956 MRN: [id number] CSN: [id number] PCP: [name], MD  Arrival date and time:  04/08/20 1306  Chief Complaint:  Headache, Neck Pain, and Fatigue   NOTE: Prior to seeing the patient today, I have reviewed the triage nursing documentation and vital signs. Clinical staff has updated patient's PMH/PSHx, current medication list, and drug allergies/intolerances to ensure comprehensive history available to assist in medical decision making.   History:   HPI: Kristina Mccoy is a 64 y.o. female who presents today with complaints of increased headaches and body aches.  Patient has chronic headaches, body aches, feelings of fatigue, but however she started to notice her headaches increase in the past couple days.  She has also noticed some chills throughout the day.  She denies any fevers, coughing, shortness of breath.  She reported the symptoms were primary care provider, and they requested that she get tested for COVID-19.  She denies any known exposure to anyone COVID-19.  She is not vaccinated for COVID-19.  She does abide by COVID-19 precautions.   Past Medical History:  Diagnosis Date  . Abdominal pain, epigastric   . Abnormal chest CT 09/08/2014  . Allergic urticaria   . Blood clotting disorder ([place])   . Breast cancer ([place]) 5/14   left breast  . Breast cancer ([place]) 02/2013   Dr Laurance Flatten ([place])  . Diabetes mellitus   . Diverticulosis   . Fibromyalgia   . GERD (gastroesophageal reflux disease)   . Heart murmur   . Hyperlipidemia   . Lymphedema of arm    right  . Myalgia   . Myositis   . Neuromyositis   . Otitis media, chronic   . Pain syndrome, chronic   . Peptic ulcer   . Sicca syndrome ([place])   . Sjogren's disease ([place])   . Sleep apnea   . SOB (shortness of breath)   . Systemic involvement of connective tissue [facility])     Past Surgical History:  Procedure Laterality Date  .  BREAST SURGERY Bilateral    mastectomy  . COLONOSCOPY WITH PROPOFOL N/A 04/22/2017   Procedure: COLONOSCOPY WITH PROPOFOL;  Surgeon: Lollie Sails, MD;  Location: [facility] ENDOSCOPY;  Service: Endoscopy;  Laterality: N/A;  . ESOPHAGOGASTRODUODENOSCOPY (EGD) WITH PROPOFOL N/A 04/22/2017   Procedure: ESOPHAGOGASTRODUODENOSCOPY (EGD) WITH PROPOFOL;  Surgeon: Lollie Sails, MD;  Location: [facility] ENDOSCOPY;  Service: Endoscopy;  Laterality: N/A;  . MASTECTOMY Bilateral 03/23/13    Family History  Problem Relation Age of Onset  . Diabetes Mother   . Heart disease Mother   . Hyperlipidemia Mother   . Heart failure Mother   . Diabetes Other   . Heart disease Brother        Aortic valve disease  . Hyperlipidemia Brother   . Colon polyps Brother   . Stroke Maternal Grandmother   . Breast cancer Maternal Aunt   . Colon cancer Paternal Grandmother   . Rheum arthritis Sister   . Psoriasis Other        psoriatic arthritis   . Heart disease Brother 51       Tachycardia  . Heart disease Brother   . Ovarian cancer Neg Hx     Social History   Tobacco Use  . Smoking status: Former Smoker    Packs/day: 1.00    Years: 30.00    Pack years: 30.00    Types: Cigarettes  Quit date: 02/28/2013    Years since quitting: 7.1  . Smokeless tobacco: Never Used  Vaping Use  . Vaping Use: Never used  Substance Use Topics  . Alcohol use: No    Alcohol/week: 0.0 standard drinks  . Drug use: No    Patient Active Problem List   Diagnosis Date Noted  . Tick bite of back 02/14/2020  . Cardiomyopathy ([place]) 01/13/2020  . History of chest pain 10/20/2019  . Former smoker 08/30/2019  . Restrictive lung disease 08/30/2019  . Foot pain 02/16/2019  . Recurrent sinusitis 01/05/2019  . Hair loss 08/31/2018  . Bilateral leg cramps 05/27/2018  . Memory difficulty 04/27/2018  . Iliotibial band syndrome 04/27/2018  . Respiratory illness 04/27/2018  . Allergic rhinitis 01/26/2018  . RUQ pain 10/28/2017    . Obesity (BMI 30.0-34.9) 10/28/2017  . Pain in limb 06/03/2017  . Chest pain 01/17/2017  . Throat fullness 10/15/2016  . 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 Rash 08/08/2016  . Lower abdominal pain 08/08/2016  . Cephalalgia 01/26/2016  . Dysuria 12/20/2015  . Vitamin D deficiency 12/20/2015  . Sjogren's syndrome ([place]) 12/20/2015  . Chronic fatigue 12/04/2015  . Insomnia 10/19/2015  . Chronic bilateral low back pain without sciatica 11/07/2014  . OSA on CPAP 10/19/2014  . Anxiety and depression 08/16/2014  . Allergic urticaria 06/02/2014  . Dyspnea 05/04/2014  . Diabetes type 2, uncontrolled ([place]) 07/06/2013  . Malignant neoplasm of breast (female) ([place]) 02/28/2013  . Blood clotting disorder ([place]) 12/01/2012  . Palpitations 12/01/2012  . Hyperlipidemia 12/01/2012  . Chronic pain disorder 06/11/2012  . Hearing loss 09/16/2011  . Fibromyalgia 06/15/2011    Home Medications:    Current Meds  Medication Sig  . ALPRAZolam (XANAX) 0.5 MG tablet TAKE 1 TABLET (0.5 MG TOTAL) BY MOUTH 4 (FOUR) TIMES DAILY AS NEEDED FOR ANXIETY.  Marland Kitchen aspirin EC 81 MG tablet Take 1 tablet (81 mg total) by mouth daily.  . Cholecalciferol (VITAMIN D3) 1000 units CAPS Take by mouth daily.   . Cyanocobalamin 1000 MCG CAPS Take by mouth. Takes occassionally  . dicyclomine (BENTYL) 10 MG capsule Take 1 capsule (10 mg total) by mouth 3 (three) times daily as needed for spasms.  . DULoxetine (CYMBALTA) 30 MG capsule TAKE 1 CAPSULE (30 MG TOTAL) BY MOUTH DAILY FOR 7 DAYS, THEN 2 CAPSULES (60 MG TOTAL) DAILY.  Marland Kitchen insulin aspart (NOVOLOG FLEXPEN) 100 UNIT/ML FlexPen Take up to 2 - 4 units before meals up to 3 times daily  . insulin regular (NOVOLIN R) 100 units/mL injection Inject 100 Units into the skin 3 (three) times daily before meals.  . traMADol (ULTRAM) 50 MG tablet TAKE 1 TABLET BY MOUTH EVERY 6 HOURS AS NEEDED. FOR PAIN  . TRESIBA FLEXTOUCH 100 UNIT/ML SOPN FlexTouch Pen 20 Units at bedtime.     Allergies:   Gabapentin, Oxycodone, Carbamazepine, Dulaglutide, Insulin glargine, Levaquin [levofloxacin], Metformin, Methylprednisolone, Morphine, Pregabalin, Venlafaxine, Buprenorphine, Canagliflozin, Escitalopram, and Hydroxychloroquine  Review of Systems (ROS): Review of Systems  Constitutional: Positive for chills. Negative for fatigue.  HENT: Negative for sinus pressure and sinus pain.   Respiratory: Negative for cough, shortness of breath and wheezing.   Gastrointestinal: Negative for diarrhea and nausea.  Musculoskeletal: Positive for myalgias.  Neurological: Positive for headaches.     Vital Signs: Today's Vitals   04/08/20 1332 04/08/20 1335 04/08/20 1400  BP:  116/76   Pulse:  77   Resp:  16   TempSrc:  Oral  SpO2:  99%   Weight: 156 lb 1.4 oz (70.8 kg)    Height: 5\' 4"  (1.626 m)    PainSc: 7   7     Physical Exam: Physical Exam Vitals and nursing note reviewed.  Constitutional:      Appearance: Normal appearance.  Eyes:     Extraocular Movements: Extraocular movements intact.  Cardiovascular:     Rate and Rhythm: Normal rate and regular rhythm.     Pulses: Normal pulses.     Heart sounds: Normal heart sounds.  Pulmonary:     Breath sounds: Normal breath sounds.  Skin:    General: Skin is warm and dry.  Neurological:     Mental Status: She is alert.  Psychiatric:        Mood and Affect: Mood normal.        Behavior: Behavior normal.        Thought Content: Thought content normal.      Urgent Care Treatments / Results:   LABS: PLEASE NOTE: all labs that were ordered this encounter are listed, however only abnormal results are displayed. Labs Reviewed  SARS CORONAVIRUS 2 (TAT 6-24 HRS)    EKG: -None  RADIOLOGY: No results found.  PROCEDURES: Procedures  MEDICATIONS RECEIVED THIS VISIT: Medications - No data to display  PERTINENT CLINICAL COURSE NOTES/UPDATES:   Initial Impression / Assessment and Plan / Urgent Care Course:   Pertinent labs & imaging results that were available during my care of the patient were personally reviewed by me and considered in my medical decision making (see lab/imaging section of note for values and interpretations).  Seletha Zimmermann is a 64 y.o. female who presents to [facility] today with complaints of headaches, diagnosed with the same, and treated as such with the directions below. NP and patient reviewed discharge instructions below during visit.   Patient is well appearing overall in clinic today. She does not appear to be in any acute distress. Presenting symptoms (see HPI) and exam as documented above.   I have reviewed the follow up and strict return precautions for any new or worsening symptoms. Patient is aware of symptoms that would be deemed urgent/emergent, and would thus require further evaluation either here or in the emergency department. At the time of discharge, she verbalized understanding and consent with the discharge plan as it was reviewed with her. All questions were fielded by provider and/or clinic staff prior to patient discharge.    Final Clinical Impressions / Urgent Care Diagnoses:   Final diagnoses:  Intractable headache, unspecified chronicity pattern, unspecified headache type  Encounter for screening laboratory testing for COVID-19 virus    New Prescriptions:  [place] Controlled Substance Registry consulted? Not Applicable  No orders of the defined types were placed in this encounter.     Discharge Instructions        Person Under Monitoring Name: Kandance Yano  Location: [place] [address]   Infection Prevention Recommendations for Individuals Confirmed to have, or Being Evaluated for, 2019 Novel Coronavirus (COVID-19) Infection Who Receive Care at Home  Individuals who are confirmed to have, or are being evaluated for, COVID-19 should follow the prevention steps below until a healthcare provider or local or  state health department says they can return to normal activities.  Stay home except to get medical care You should restrict activities outside your home, except for getting medical care. Do not go to work, school, or public areas, and do  not use public transportation or taxis.  Call ahead before visiting your doctor Before your medical appointment, call the healthcare provider and tell them that you have, or are being evaluated for, COVID-19 infection. This will help the healthcare provider's office take steps to keep other people from getting infected. Ask your healthcare provider to call the local or state health department.  Monitor your symptoms Seek prompt medical attention if your illness is worsening (e.g., difficulty breathing). Before going to your medical appointment, call the healthcare provider and tell them that you have, or are being evaluated for, COVID-19 infection. Ask your healthcare provider to call the local or state health department.  Wear a facemask You should wear a facemask that covers your nose and mouth when you are in the same room with other people and when you visit a healthcare provider. People who live with or visit you should also wear a facemask while they are in the same room with you.  Separate yourself from other people in your home As much as possible, you should stay in a different room from other people in your home. Also, you should use a separate bathroom, if available.  Avoid sharing household items You should not share dishes, drinking glasses, cups, eating utensils, towels, bedding, or other items with other people in your home. After using these items, you should wash them thoroughly with soap and water.  Cover your coughs and sneezes Cover your mouth and nose with a tissue when you cough or sneeze, or you can cough or sneeze into your sleeve. Throw used tissues in a lined trash can, and immediately wash your hands with soap and water for  at least 20 seconds or use an alcohol-based hand rub.  Wash your [facility] your hands often and thoroughly with soap and water for at least 20 seconds. You can use an alcohol-based hand sanitizer if soap and water are not available and if your hands are not visibly dirty. Avoid touching your eyes, nose, and mouth with unwashed hands.   Prevention Steps for Caregivers and Household Members of Individuals Confirmed to have, or Being Evaluated for, COVID-19 Infection Being Cared for in the Home  If you live with, or provide care at home for, a person confirmed to have, or being evaluated for, COVID-19 infection please follow these guidelines to prevent infection:  Follow healthcare provider's instructions Make sure that you understand and can help the patient follow any healthcare provider instructions for all care.  Provide for the patient's basic needs You should help the patient with basic needs in the home and provide support for getting groceries, prescriptions, and other personal needs.  Monitor the patient's symptoms If they are getting sicker, call his or her medical provider and tell them that the patient has, or is being evaluated for, COVID-19 infection. This will help the healthcare provider's office take steps to keep other people from getting infected. Ask the healthcare provider to call the local or state health department.  Limit the number of people who have contact with the patient  If possible, have only one caregiver for the patient.  Other household members should stay in another home or place of residence. If this is not possible, they should stay  in another room, or be separated from the patient as much as possible. Use a separate bathroom, if available.  Restrict visitors who do not have an essential need to be in the home.  Keep older adults, very young children, and other  sick people away from the patient Keep older adults, very young children, and those  who have compromised immune systems or chronic health conditions away from the patient. This includes people with chronic heart, lung, or kidney conditions, diabetes, and cancer.  Ensure good ventilation Make sure that shared spaces in the home have good air flow, such as from an air conditioner or an opened window, weather permitting.  Wash your hands often  Wash your hands often and thoroughly with soap and water for at least 20 seconds. You can use an alcohol based hand sanitizer if soap and water are not available and if your hands are not visibly dirty.  Avoid touching your eyes, nose, and mouth with unwashed hands.  Use disposable paper towels to dry your hands. If not available, use dedicated cloth towels and replace them when they become wet.  Wear a facemask and gloves  Wear a disposable facemask at all times in the room and gloves when you touch or have contact with the patient's blood, body fluids, and/or secretions or excretions, such as sweat, saliva, sputum, nasal mucus, vomit, urine, or feces.  Ensure the mask fits over your nose and mouth tightly, and do not touch it during use.  Throw out disposable facemasks and gloves after using them. Do not reuse.  Wash your hands immediately after removing your facemask and gloves.  If your personal clothing becomes contaminated, carefully remove clothing and launder. Wash your hands after handling contaminated clothing.  Place all used disposable facemasks, gloves, and other waste in a lined container before disposing them with other household waste.  Remove gloves and wash your hands immediately after handling these items.  Do not share dishes, glasses, or other household items with the patient  Avoid sharing household items. You should not share dishes, drinking glasses, cups, eating utensils, towels, bedding, or other items with a patient who is confirmed to have, or being evaluated for, COVID-19 infection.  After the person  uses these items, you should wash them thoroughly with soap and water.  Wash laundry thoroughly  Immediately remove and wash clothes or bedding that have blood, body fluids, and/or secretions or excretions, such as sweat, saliva, sputum, nasal mucus, vomit, urine, or feces, on them.  Wear gloves when handling laundry from the patient.  Read and follow directions on labels of laundry or clothing items and detergent. In general, wash and dry with the warmest temperatures recommended on the label.  Clean all areas the individual has used often  Clean all touchable surfaces, such as counters, tabletops, doorknobs, bathroom fixtures, toilets, phones, keyboards, tablets, and bedside tables, every day. Also, clean any surfaces that may have blood, body fluids, and/or secretions or excretions on them.  Wear gloves when cleaning surfaces the patient has come in contact with.  Use a diluted bleach solution (e.g., dilute bleach with 1 part bleach and 10 parts water) or a household disinfectant with a label that says EPA-registered for coronaviruses. To make a bleach solution at home, add 1 tablespoon of bleach to 1 quart (4 cups) of water. For a larger supply, add  cup of bleach to 1 gallon (16 cups) of water.  Read labels of cleaning products and follow recommendations provided on product labels. Labels contain instructions for safe and effective use of the cleaning product including precautions you should take when applying the product, such as wearing gloves or eye protection and making sure you have good ventilation during use of the product.  Remove gloves  and wash hands immediately after cleaning.  Monitor yourself for signs and symptoms of illness Caregivers and household members are considered close contacts, should monitor their health, and will be asked to limit movement outside of the home to the extent possible. Follow the monitoring steps for close contacts listed on the symptom  monitoring form.   ? If you have additional questions, contact your local health department or call the epidemiologist on call at [phone] (available 24/7). ? This guidance is subject to change. For the most up-to-date guidance from [facility], please refer to their website: YouBlogs.pl      Recommended Follow up Care:  Patient encouraged to follow up with the following provider within the specified time frame, or sooner as dictated by the severity of her symptoms. As always, she was instructed that for any urgent/emergent care needs, she should seek care either here or in the emergency department for more immediate evaluation.   Gertie Baron, DNP, NP-c    Gertie Baron, NP 04/08/20 1827

## 2020-04-08 NOTE — ED Triage Notes (Signed)
Patient c/o headache and neck pain for 3 weeks.  Patient denies fevers.

## 2020-04-10 ENCOUNTER — Telehealth: Payer: Self-pay | Admitting: Internal Medicine

## 2020-04-10 DIAGNOSIS — Z9989 Dependence on other enabling machines and devices: Secondary | ICD-10-CM

## 2020-04-10 DIAGNOSIS — G4733 Obstructive sleep apnea (adult) (pediatric): Secondary | ICD-10-CM

## 2020-04-10 NOTE — Telephone Encounter (Signed)
Rx for tubing and mask of choice has been sent to [facility].  pt is aware and voiced her understanding. Nothing further is needed.

## 2020-04-11 DIAGNOSIS — E1165 Type 2 diabetes mellitus with hyperglycemia: Secondary | ICD-10-CM | POA: Diagnosis not present

## 2020-04-11 DIAGNOSIS — E559 Vitamin D deficiency, unspecified: Secondary | ICD-10-CM | POA: Diagnosis not present

## 2020-04-11 DIAGNOSIS — Z794 Long term (current) use of insulin: Secondary | ICD-10-CM | POA: Diagnosis not present

## 2020-04-11 DIAGNOSIS — E1169 Type 2 diabetes mellitus with other specified complication: Secondary | ICD-10-CM | POA: Diagnosis not present

## 2020-04-11 DIAGNOSIS — E785 Hyperlipidemia, unspecified: Secondary | ICD-10-CM | POA: Diagnosis not present

## 2020-04-19 DIAGNOSIS — G4733 Obstructive sleep apnea (adult) (pediatric): Secondary | ICD-10-CM | POA: Diagnosis not present

## 2020-04-24 DIAGNOSIS — E119 Type 2 diabetes mellitus without complications: Secondary | ICD-10-CM | POA: Diagnosis not present

## 2020-04-24 LAB — HM DIABETES EYE EXAM

## 2020-04-27 ENCOUNTER — Encounter: Payer: Self-pay | Admitting: *Deleted

## 2020-04-27 ENCOUNTER — Other Ambulatory Visit: Payer: Self-pay | Admitting: Family Medicine

## 2020-04-28 ENCOUNTER — Other Ambulatory Visit: Payer: Self-pay

## 2020-04-28 ENCOUNTER — Encounter: Payer: Self-pay | Admitting: Family Medicine

## 2020-04-28 ENCOUNTER — Ambulatory Visit (INDEPENDENT_AMBULATORY_CARE_PROVIDER_SITE_OTHER): Payer: PPO | Admitting: Family Medicine

## 2020-04-28 VITALS — BP 100/60 | HR 74 | Temp 97.4°F | Ht 64.0 in | Wt 151.0 lb

## 2020-04-28 DIAGNOSIS — E559 Vitamin D deficiency, unspecified: Secondary | ICD-10-CM

## 2020-04-28 DIAGNOSIS — E785 Hyperlipidemia, unspecified: Secondary | ICD-10-CM

## 2020-04-28 DIAGNOSIS — R748 Abnormal levels of other serum enzymes: Secondary | ICD-10-CM

## 2020-04-28 NOTE — Assessment & Plan Note (Signed)
Check vitamin D. 

## 2020-04-28 NOTE — Progress Notes (Signed)
  [name], MD Phone: [phone]  Kristina Mccoy is a 64 y.o. female who presents today for f/u.  HYPERLIPIDEMIA Symptoms Chest pain on exertion:  no    Medications: Compliance- not on medication   Muscle aches- chronic related to fibromyalgia vs muscular disease Patient believes she may have been on a statin at some point in the past.  She does not remember any specific side effects other than that one may have dropped her blood pressure.  It looks like she may have been placed on Crestor at some point in the past.  Elevated aldolase: Patient has an appointment with rheumatology in September.  She has chronic muscle and joint aches.    Social History   Tobacco Use  Smoking Status Former Smoker  . Packs/day: 1.00  . Years: 30.00  . Pack years: 30.00  . Types: Cigarettes  . Quit date: 02/28/2013  . Years since quitting: 7.1  Smokeless Tobacco Never Used     ROS see history of present illness  Objective  Physical Exam Vitals:   04/28/20 1412  BP: (!) 100/60  Pulse: 74  Temp: (!) 97.4 F (36.3 C)  SpO2: 98%    BP Readings from Last 3 Encounters:  04/28/20 (!) 100/60  04/08/20 116/76  03/13/20 112/68   Wt Readings from Last 3 Encounters:  04/28/20 151 lb (68.5 kg)  04/08/20 156 lb 1.4 oz (70.8 kg)  03/13/20 156 lb (70.8 kg)    Physical Exam Constitutional:      General: She is not in acute distress.    Appearance: She is not diaphoretic.  Cardiovascular:     Rate and Rhythm: Normal rate and regular rhythm.     Heart sounds: Normal heart sounds.  Pulmonary:     Effort: Pulmonary effort is normal.     Breath sounds: Normal breath sounds.  Musculoskeletal:     Right lower leg: No edema.     Left lower leg: No edema.  Skin:    General: Skin is warm and dry.  Neurological:     Mental Status: She is alert.      Assessment/Plan: Please see individual problem list.  Hyperlipidemia Plan to check lipids.  We will check her liver function  tests as well as she reports those have been elevated recently.  Consider starting on a statin once these labs return.  Elevated aldolase level She will keep her appointment with rheumatology.  Vitamin D deficiency Check vitamin D.    Orders Placed This Encounter  Procedures  . Lipid panel  . Comp Met (CMET)  . Vitamin D (25 hydroxy)    No orders of the defined types were placed in this encounter.   This visit occurred during the SARS-CoV-2 public health emergency.  Safety protocols were in place, including screening questions prior to the visit, additional usage of staff PPE, and extensive cleaning of exam room while observing appropriate contact time as indicated for disinfecting solutions.    [name], MD [place]

## 2020-04-28 NOTE — Assessment & Plan Note (Signed)
Plan to check lipids.  We will check her liver function tests as well as she reports those have been elevated recently.  Consider starting on a statin once these labs return.

## 2020-04-28 NOTE — Patient Instructions (Signed)
Nice to see you. We will check lab work today and then figure out what the next step will be regarding statin therapy. Please keep your appointment with rheumatology.

## 2020-04-28 NOTE — Assessment & Plan Note (Signed)
She will keep her appointment with rheumatology.

## 2020-04-28 NOTE — Addendum Note (Signed)
Addended by: Tor Netters I on: 04/28/2020 02:46 PM   Modules accepted: Orders

## 2020-04-29 LAB — COMPREHENSIVE METABOLIC PANEL
AG Ratio: 1.3 (calc) (ref 1.0–2.5)
ALT: 47 U/L — ABNORMAL HIGH (ref 6–29)
AST: 30 U/L (ref 10–35)
Albumin: 4 g/dL (ref 3.6–5.1)
Alkaline phosphatase (APISO): 129 U/L (ref 37–153)
BUN/Creatinine Ratio: 23 (calc) — ABNORMAL HIGH (ref 6–22)
BUN: 25 mg/dL (ref 7–25)
CO2: 26 mmol/L (ref 20–32)
Calcium: 9.4 mg/dL (ref 8.6–10.4)
Chloride: 100 mmol/L (ref 98–110)
Creat: 1.1 mg/dL — ABNORMAL HIGH (ref 0.50–0.99)
Globulin: 3.2 g/dL (calc) (ref 1.9–3.7)
Glucose, Bld: 200 mg/dL — ABNORMAL HIGH (ref 65–99)
Potassium: 4.4 mmol/L (ref 3.5–5.3)
Sodium: 136 mmol/L (ref 135–146)
Total Bilirubin: 0.5 mg/dL (ref 0.2–1.2)
Total Protein: 7.2 g/dL (ref 6.1–8.1)

## 2020-04-29 LAB — LIPID PANEL
Cholesterol: 289 mg/dL — ABNORMAL HIGH (ref <200)
HDL: 54 mg/dL (ref >=50)
LDL Cholesterol (Calc): 200 mg/dL (calc) — ABNORMAL HIGH
Non-HDL Cholesterol (Calc): 235 mg/dL (calc) — ABNORMAL HIGH (ref <130)
Total CHOL/HDL Ratio: 5.4 (calc) — ABNORMAL HIGH (ref <5.0)
Triglycerides: 177 mg/dL — ABNORMAL HIGH (ref <150)

## 2020-04-29 LAB — VITAMIN D 25 HYDROXY (VIT D DEFICIENCY, FRACTURES): Vit D, 25-Hydroxy: 29 ng/mL — ABNORMAL LOW (ref 30–100)

## 2020-05-01 ENCOUNTER — Other Ambulatory Visit: Payer: Self-pay | Admitting: Family Medicine

## 2020-05-01 DIAGNOSIS — N179 Acute kidney failure, unspecified: Secondary | ICD-10-CM

## 2020-05-03 ENCOUNTER — Other Ambulatory Visit: Payer: Self-pay | Admitting: Family Medicine

## 2020-05-03 MED ORDER — ROSUVASTATIN CALCIUM 40 MG PO TABS: 40.0000 mg | ORAL_TABLET | Freq: Every day | ORAL | 3 refills | Status: DC

## 2020-05-04 ENCOUNTER — Other Ambulatory Visit (INDEPENDENT_AMBULATORY_CARE_PROVIDER_SITE_OTHER): Payer: PPO

## 2020-05-04 ENCOUNTER — Other Ambulatory Visit: Payer: Self-pay

## 2020-05-04 DIAGNOSIS — L218 Other seborrheic dermatitis: Secondary | ICD-10-CM | POA: Diagnosis not present

## 2020-05-04 DIAGNOSIS — L508 Other urticaria: Secondary | ICD-10-CM | POA: Diagnosis not present

## 2020-05-04 DIAGNOSIS — N179 Acute kidney failure, unspecified: Secondary | ICD-10-CM | POA: Diagnosis not present

## 2020-05-04 DIAGNOSIS — L578 Other skin changes due to chronic exposure to nonionizing radiation: Secondary | ICD-10-CM | POA: Diagnosis not present

## 2020-05-04 LAB — BASIC METABOLIC PANEL
BUN: 16 mg/dL (ref 6–23)
CO2: 27 mEq/L (ref 19–32)
Calcium: 9.6 mg/dL (ref 8.4–10.5)
Chloride: 93 mEq/L — ABNORMAL LOW (ref 96–112)
Creatinine, Ser: 0.86 mg/dL (ref 0.40–1.20)
GFR: 66.8 mL/min (ref >60.00)
Glucose, Bld: 202 mg/dL — ABNORMAL HIGH (ref 70–99)
Potassium: 4.4 mEq/L (ref 3.5–5.1)
Sodium: 129 mEq/L — ABNORMAL LOW (ref 135–145)

## 2020-05-05 ENCOUNTER — Other Ambulatory Visit: Payer: Self-pay | Admitting: Family Medicine

## 2020-05-05 DIAGNOSIS — E871 Hypo-osmolality and hyponatremia: Secondary | ICD-10-CM

## 2020-05-08 ENCOUNTER — Other Ambulatory Visit: Payer: Self-pay

## 2020-05-08 ENCOUNTER — Other Ambulatory Visit (INDEPENDENT_AMBULATORY_CARE_PROVIDER_SITE_OTHER): Payer: PPO

## 2020-05-08 DIAGNOSIS — E871 Hypo-osmolality and hyponatremia: Secondary | ICD-10-CM | POA: Diagnosis not present

## 2020-05-09 LAB — BASIC METABOLIC PANEL
BUN: 19 mg/dL (ref 6–23)
CO2: 28 mEq/L (ref 19–32)
Calcium: 9.4 mg/dL (ref 8.4–10.5)
Chloride: 99 mEq/L (ref 96–112)
Creatinine, Ser: 0.79 mg/dL (ref 0.40–1.20)
GFR: 73.67 mL/min (ref >60.00)
Glucose, Bld: 162 mg/dL — ABNORMAL HIGH (ref 70–99)
Potassium: 3.9 mEq/L (ref 3.5–5.1)
Sodium: 137 mEq/L (ref 135–145)

## 2020-05-10 ENCOUNTER — Other Ambulatory Visit: Payer: Self-pay | Admitting: Family Medicine

## 2020-05-10 DIAGNOSIS — E785 Hyperlipidemia, unspecified: Secondary | ICD-10-CM

## 2020-05-25 ENCOUNTER — Other Ambulatory Visit: Payer: Self-pay | Admitting: Family Medicine

## 2020-06-14 ENCOUNTER — Encounter: Payer: Self-pay | Admitting: Internal Medicine

## 2020-06-14 ENCOUNTER — Ambulatory Visit: Payer: PPO | Admitting: Internal Medicine

## 2020-06-14 ENCOUNTER — Other Ambulatory Visit: Payer: Self-pay

## 2020-06-14 VITALS — BP 122/62 | HR 72 | Ht 64.0 in | Wt 151.0 lb

## 2020-06-14 DIAGNOSIS — Z79899 Other long term (current) drug therapy: Secondary | ICD-10-CM

## 2020-06-14 DIAGNOSIS — I428 Other cardiomyopathies: Secondary | ICD-10-CM

## 2020-06-14 DIAGNOSIS — R002 Palpitations: Secondary | ICD-10-CM | POA: Diagnosis not present

## 2020-06-14 DIAGNOSIS — I5032 Chronic diastolic (congestive) heart failure: Secondary | ICD-10-CM | POA: Diagnosis not present

## 2020-06-14 MED ORDER — LOSARTAN POTASSIUM 25 MG PO TABS: 12.5000 mg | ORAL_TABLET | Freq: Every day | ORAL | 1 refills | Status: DC

## 2020-06-14 NOTE — Progress Notes (Signed)
Follow-up Outpatient Visit Date: 06/14/2020  Primary Care Provider: [name], MD [address] [place] [address]  Chief Complaint: Fatigue and body pains  HPI:  Kristina Mccoy is a 64 y.o. female with history of Sjogren's disease with sicca syndrome, hyperlipidemia, diabetes mellitus, fibromyalgia, neuromyositis, and sleep apneaon CPAP, who presents for follow-up of chest pain and shortness of breath. She was last seen in our office in June by Laurann Montana, NP, for follow-up of event monitoring and stress testing. She continued to report daily palpitations with associated chest tightness and dyspnea.  Today, Kristina Mccoy reports that she has been more fatigued than when we last saw each other.  She also has pain throughout her body.  She notes that recent lab work by Dr. Caryl Bis was abnormal and has resulted in a referral to rheumatology at [facility] scheduled for next week.  She continues to have frequent shortness of breath both at rest and with activity, which is similar to prior visits.  She also has random pressure in her chest that lasts a few minutes at a time and is not related to activity.  She also has occasional palpitations lasting a few minutes at a time without associated symptoms.  She denies orthopnea, PND, and edema.  She was recently started on rosuvastatin by Dr. Caryl Bis and has been tolerating this well.  --------------------------------------------------------------------------------------------------  Cardiovascular History & Procedures: Cardiovascular Problems:  Chest pain and shortness of breath  NICM  Risk Factors:  Hyperlipidemia and diabetes mellitus  Cath/PCI:  R/LHC (03/28/2015):No significant coronary artery disease. Normal left and right heart filling pressures. Low normal Fick cardiac output.  CV Surgery:  None  EP Procedures and Devices:  None  Non-Invasive Evaluation(s):  Pharmacologic myocardial perfusion  stress test (02/14/2020): Small mid/apical anterior and apical defect most likely representing breast attenuation but cannot rule out scar.  No significant ischemia noted.  LVEF greater than 65%.  Study low risk no degraded by breast attenuation and extracardiac activity.  TTE (12/21/2019): Normal LV size with mild LVH.  LVEF 45-50% with global hypokinesis and normal diastolic function.  Normal RV size and function.  Trivial mitral regurgitation.  Normal CVP.  ABIs (12/21/2019): Normal.  Pharmacologic MPI (12/14/2014): Mild anterior and apical ischemia. LVEF 54%.  Recent CV Pertinent Labs: Lab Results  Component Value Date   CHOL 289 (H) 04/28/2020   CHOL 113 09/03/2014   HDL 54 04/28/2020   HDL 16 (L) 09/03/2014   LDLCALC 200 (H) 04/28/2020   LDLCALC 76 09/03/2014   LDLDIRECT 111.0 04/18/2015   TRIG 177 (H) 04/28/2020   TRIG 107 09/03/2014   CHOLHDL 5.4 (H) 04/28/2020   INR 1.00 04/22/2017   BNP 3,333 (H) 09/03/2014   K 3.9 05/08/2020   K 3.9 09/03/2014   MG 1.9 08/17/2019   BUN 19 05/08/2020   BUN 11 02/19/2019   BUN 10 09/03/2014   CREATININE 0.79 05/08/2020   CREATININE 1.10 (H) 04/28/2020    Past medical and surgical history were reviewed and updated in EPIC.  Current Meds  Medication Sig  . albuterol (VENTOLIN HFA) 108 (90 Base) MCG/ACT inhaler Inhale 2 puffs into the lungs every 4 (four) hours as needed for wheezing or shortness of breath.  . ALPRAZolam (XANAX) 0.5 MG tablet TAKE 1 TABLET (0.5 MG TOTAL) BY MOUTH 4 (FOUR) TIMES DAILY AS NEEDED FOR ANXIETY.  Marland Kitchen aspirin EC 81 MG tablet Take 1 tablet (81 mg total) by mouth daily.  . baclofen (LIORESAL) 10  MG tablet Take 10 mg by mouth 2 (two) times daily.  . Cholecalciferol (VITAMIN D3) 1000 units CAPS Take by mouth daily.   . clobetasol ointment (TEMOVATE) 0.05 % Apply topically. Vaginally  . Continuous Blood Gluc Sensor (FREESTYLE LIBRE 14 DAY SENSOR) MISC SMARTSIG:1 Each Topical Every 2 Weeks  . Cyanocobalamin 1000 MCG  CAPS Take by mouth. Takes occassionally  . dicyclomine (BENTYL) 10 MG capsule Take 1 capsule (10 mg total) by mouth 3 (three) times daily as needed for spasms.  . Emollient (CERAVE) CREA Apply topically daily.   . famotidine (PEPCID) 20 MG tablet Take 1 tablet (20 mg total) by mouth at bedtime for 14 days.  Marland Kitchen FREESTYLE LITE test strip CHECK 6 TIMES DAILY  . glucose blood (ONE TOUCH ULTRA TEST) test strip CHECK SIX TIMES DAILY  . insulin aspart (NOVOLOG FLEXPEN) 100 UNIT/ML FlexPen Take up to 2 - 4 units before meals up to 3 times daily  . Insulin Syringe-Needle U-100 (INSULIN SYRINGE .5CC/31GX5/16") 31G X 5/16" 0.5 ML MISC Use twice daily with injections  . ketoconazole (NIZORAL) 2 % cream Apply 1 application topically 2 (two) times daily.  Marland Kitchen ketoconazole (NIZORAL) 2 % shampoo Apply 1 application topically once a week.  . Lancets MISC Use. BAYER CONTOUR LANCETS  . mupirocin ointment (BACTROBAN) 2 % Apply two times a day for 7 days.  Marland Kitchen nystatin cream (MYCOSTATIN) Apply 1 application topically as needed.  . ondansetron (ZOFRAN) 4 MG tablet TAKE 1 TABLET BY MOUTH EVERY 8 HOURS AS NEEDED FOR NAUSEA AND VOMITING  . ONETOUCH DELICA LANCETS 28N MISC TWICE DAILY  . Polyethyl Glycol-Propyl Glycol (SYSTANE OP) Apply to eye.  . pseudoephedrine-guaifenesin (MUCINEX D) 60-600 MG 12 hr tablet Take 1 tablet by mouth 2 (two) times daily as needed.   . rosuvastatin (CRESTOR) 40 MG tablet Take 1 tablet (40 mg total) by mouth daily.  . Tiotropium Bromide Monohydrate (SPIRIVA RESPIMAT) 1.25 MCG/ACT AERS Inhale 2 puffs into the lungs daily.  . traMADol (ULTRAM) 50 MG tablet TAKE 1 TABLET BY MOUTH EVERY 6 HOURS AS NEEDED. FOR PAIN  . TRESIBA FLEXTOUCH 100 UNIT/ML SOPN FlexTouch Pen 20 Units at bedtime.  . triamcinolone (NASACORT ALLERGY 24HR) 55 MCG/ACT AERO nasal inhaler Place 2 sprays into the nose daily.    Allergies: Gabapentin, Oxycodone, Carbamazepine, Dulaglutide, Insulin glargine, Levaquin [levofloxacin],  Metformin, Methylprednisolone, Morphine, Pregabalin, Venlafaxine, Buprenorphine, Canagliflozin, Escitalopram, and Hydroxychloroquine  Social History   Tobacco Use  . Smoking status: Former Smoker    Packs/day: 1.00    Years: 30.00    Pack years: 30.00    Types: Cigarettes    Quit date: 02/28/2013    Years since quitting: 7.2  . Smokeless tobacco: Never Used  Vaping Use  . Vaping Use: Never used  Substance Use Topics  . Alcohol use: No    Alcohol/week: 0.0 standard drinks  . Drug use: No    Family History  Problem Relation Age of Onset  . Diabetes Mother   . Heart disease Mother   . Hyperlipidemia Mother   . Heart failure Mother   . Diabetes Other   . Heart disease Brother        Aortic valve disease  . Hyperlipidemia Brother   . Colon polyps Brother   . Stroke Maternal Grandmother   . Breast cancer Maternal Aunt   . Colon cancer Paternal Grandmother   . Rheum arthritis Sister   . Psoriasis Other        psoriatic arthritis   .  Heart disease Brother 51       Tachycardia  . Heart disease Brother   . Ovarian cancer Neg Hx     Review of Systems: A 12-system review of systems was performed and was negative except as noted in the HPI.  --------------------------------------------------------------------------------------------------  Physical Exam: BP 122/62 (BP Location: Left Arm, Patient Position: Sitting, Cuff Size: Normal)   Pulse 72   Ht 5\' 4"  (1.626 m)   Wt 151 lb (68.5 kg)   BMI 25.92 kg/m   General: NAD. Neck: No JVD or HJR. Lungs: Clear to auscultation without wheezes or crackles. Heart: Regular rate and rhythm without murmurs, rubs, or gallops. Abdomen: Soft, nontender, nondistended. Extremities: No lower extremity edema.  EKG: Normal sinus rhythm without abnormality.  No significant change from prior tracing on 01/12/2020.  Lab Results  Component Value Date   WBC 10.8 02/19/2019   HGB 13.5 02/19/2019   HCT 40.4 02/19/2019   MCV 92 02/19/2019    PLT 278 02/19/2019    Lab Results  Component Value Date   NA 137 05/08/2020   K 3.9 05/08/2020   CL 99 05/08/2020   CO2 28 05/08/2020   BUN 19 05/08/2020   CREATININE 0.79 05/08/2020   GLUCOSE 162 (H) 05/08/2020   ALT 47 (H) 04/28/2020    Lab Results  Component Value Date   CHOL 289 (H) 04/28/2020   HDL 54 04/28/2020   LDLCALC 200 (H) 04/28/2020   LDLDIRECT 111.0 04/18/2015   TRIG 177 (H) 04/28/2020   CHOLHDL 5.4 (H) 04/28/2020    --------------------------------------------------------------------------------------------------  ASSESSMENT AND PLAN: Chronic HFpEF and nonischemic cardiomyopathy: Kristina Mccoy continues to have profound fatigue as well as shortness of breath.  I do not believe that heart failure alone is driving this.  She appears euvolemic on exam today but endorses NYHA class IV symptoms.  I agree with rheumatology consultation for further assessment of her autoimmune disease.  In the meantime, we will add losartan 12.5 mg daily in an effort to get her on some evidence-based heart failure therapy.  She has previously been intolerant of metoprolol.  We will also plan to repeat a limited echo shortly before her follow-up visit to reassess her LVEF and ensure that it has not declined further and is contributing to her worsening symptoms.  We will repeat a basic metabolic panel in about 2 weeks to ensure stable renal function and potassium.  Palpitations: These remain brief and self-limited without associated symptoms.  Event monitor this summer showed sinus rhythm with rare PACs and PVCs.  No further intervention is planned at this time.  Follow-up: Return to clinic in 6 weeks.  Nelva Bush, MD 06/14/2020 2:27 PM

## 2020-06-14 NOTE — Patient Instructions (Signed)
Medication Instructions:  Your physician has recommended you make the following change in your medication:  1- START Losartan 12.5 mg (0.5 tablet) by mouth once a day.  *If you need a refill on your cardiac medications before your next appointment, please call your pharmacy*   Lab Work: Your physician recommends that you return for lab work in: 2 weeks for BMET (around 06/28/20). - Please go to the [facility]. You will check in at the front desk to the right as you walk into the atrium. Valet Parking is offered if needed. - No appointment needed. You may go any day between 7 am and 6 pm.  If you have labs (blood work) drawn today and your tests are completely normal, you will receive your results only by: Marland Kitchen MyChart Message (if you have MyChart) OR . A paper copy in the mail If you have any lab test that is abnormal or we need to change your treatment, we will call you to review the results.   Testing/Procedures: Your physician has requested that you have an LIMITED echocardiogram in 6 weeks. Echocardiography is a painless test that uses sound waves to create images of your heart. It provides your doctor with information about the size and shape of your heart and how well your heart's chambers and valves are working. This procedure takes approximately one hour. There are no restrictions for this procedure. You may get an IV, if needed, to receive an ultrasound enhancing agent through to better visualize your heart.   Follow-Up: At [facility], you and your health needs are our priority.  As part of our continuing mission to provide you with exceptional heart care, we have created designated Provider Care Teams.  These Care Teams include your primary Cardiologist (physician) and Advanced Practice Providers (APPs -  Physician Assistants and Nurse Practitioners) who all work together to provide you with the care you need, when you need it.  We recommend signing up for the patient portal  called "MyChart".  Sign up information is provided on this After Visit Summary.  MyChart is used to connect with patients for Virtual Visits (Telemedicine).  Patients are able to view lab/test results, encounter notes, upcoming appointments, etc.  Non-urgent messages can be sent to your provider as well.   To learn more about what you can do with MyChart, go to [email or web address].    Your next appointment:   6 week(s)  (can be on same day as limited echo if able)  The format for your next appointment:   In Person  Provider:    You may see Nelva Bush, MD or one of the following Advanced Practice Providers on your designated Care Team:    Murray Hodgkins, NP  Christell Faith, PA-C  Marrianne Mood, PA-C

## 2020-06-15 ENCOUNTER — Encounter: Payer: Self-pay | Admitting: Internal Medicine

## 2020-06-15 DIAGNOSIS — I5032 Chronic diastolic (congestive) heart failure: Secondary | ICD-10-CM | POA: Insufficient documentation

## 2020-06-16 ENCOUNTER — Other Ambulatory Visit: Payer: PPO

## 2020-06-19 ENCOUNTER — Other Ambulatory Visit: Payer: Self-pay

## 2020-06-19 ENCOUNTER — Other Ambulatory Visit (INDEPENDENT_AMBULATORY_CARE_PROVIDER_SITE_OTHER): Payer: PPO

## 2020-06-19 DIAGNOSIS — R531 Weakness: Secondary | ICD-10-CM | POA: Diagnosis not present

## 2020-06-19 DIAGNOSIS — R768 Other specified abnormal immunological findings in serum: Secondary | ICD-10-CM | POA: Diagnosis not present

## 2020-06-19 DIAGNOSIS — H04123 Dry eye syndrome of bilateral lacrimal glands: Secondary | ICD-10-CM | POA: Diagnosis not present

## 2020-06-19 DIAGNOSIS — E785 Hyperlipidemia, unspecified: Secondary | ICD-10-CM | POA: Diagnosis not present

## 2020-06-19 DIAGNOSIS — M2550 Pain in unspecified joint: Secondary | ICD-10-CM | POA: Diagnosis not present

## 2020-06-19 DIAGNOSIS — M797 Fibromyalgia: Secondary | ICD-10-CM | POA: Diagnosis not present

## 2020-06-19 DIAGNOSIS — R21 Rash and other nonspecific skin eruption: Secondary | ICD-10-CM | POA: Diagnosis not present

## 2020-06-19 LAB — HEPATIC FUNCTION PANEL
ALT: 83 U/L — ABNORMAL HIGH (ref 0–35)
AST: 48 U/L — ABNORMAL HIGH (ref 0–37)
Albumin: 4 g/dL (ref 3.5–5.2)
Alkaline Phosphatase: 238 U/L — ABNORMAL HIGH (ref 39–117)
Bilirubin, Direct: 0.1 mg/dL (ref 0.0–0.3)
Total Bilirubin: 0.4 mg/dL (ref 0.2–1.2)
Total Protein: 7.2 g/dL (ref 6.0–8.3)

## 2020-06-19 LAB — LDL CHOLESTEROL, DIRECT: Direct LDL: 153 mg/dL

## 2020-06-20 ENCOUNTER — Other Ambulatory Visit: Payer: PPO

## 2020-06-22 ENCOUNTER — Other Ambulatory Visit: Payer: Self-pay | Admitting: Family Medicine

## 2020-06-24 ENCOUNTER — Other Ambulatory Visit: Payer: Self-pay | Admitting: Family Medicine

## 2020-06-24 DIAGNOSIS — R7989 Other specified abnormal findings of blood chemistry: Secondary | ICD-10-CM

## 2020-06-24 DIAGNOSIS — R945 Abnormal results of liver function studies: Secondary | ICD-10-CM

## 2020-06-26 ENCOUNTER — Encounter: Payer: Self-pay | Admitting: Family Medicine

## 2020-06-29 ENCOUNTER — Telehealth: Payer: Self-pay | Admitting: Internal Medicine

## 2020-06-29 ENCOUNTER — Encounter: Payer: Self-pay | Admitting: Family Medicine

## 2020-06-29 ENCOUNTER — Ambulatory Visit: Payer: PPO | Admitting: Internal Medicine

## 2020-06-29 DIAGNOSIS — R7989 Other specified abnormal findings of blood chemistry: Secondary | ICD-10-CM

## 2020-06-29 DIAGNOSIS — R945 Abnormal results of liver function studies: Secondary | ICD-10-CM

## 2020-06-29 NOTE — Telephone Encounter (Signed)
RECOMMEND COVID TESTING ON ALL PATIENTS WITH SINUS AND RESPIRATORY ISSUES THAT ARE UNVACCINATED

## 2020-06-29 NOTE — Telephone Encounter (Signed)
Patient is aware of below recommendations and voiced her understanding.  I have provided patient with [facility] testing site contact number.  appt has been rescheduled for 08/18/2020. Nothing further needed.

## 2020-06-29 NOTE — Telephone Encounter (Signed)
Patient is scheduled for OV today at 2;30. Patient stated that she is experiencing productive cough with clear mucus and nasal congestion x5d.  She has history of sinus issues. Denied fever, sob, chills, sweats or additional symptoms.   She has no had covid vaccines.   Dr. Mortimer Fries, please advise. Is it okay for patient to come in for her appt today?

## 2020-06-29 NOTE — Telephone Encounter (Signed)
App of the day ask me to forward this to you.

## 2020-06-30 ENCOUNTER — Other Ambulatory Visit: Payer: PPO

## 2020-07-03 ENCOUNTER — Other Ambulatory Visit
Admission: RE | Admit: 2020-07-03 | Discharge: 2020-07-03 | Disposition: A | Discharge status: Home / Self Care | Payer: PPO | Source: Home / Self Care | Attending: Internal Medicine | Admitting: Internal Medicine

## 2020-07-03 ENCOUNTER — Other Ambulatory Visit
Admission: RE | Admit: 2020-07-03 | Discharge: 2020-07-03 | Disposition: A | Discharge status: Home / Self Care | Payer: PPO | Attending: Family Medicine | Admitting: Family Medicine

## 2020-07-03 DIAGNOSIS — R7989 Other specified abnormal findings of blood chemistry: Secondary | ICD-10-CM

## 2020-07-03 DIAGNOSIS — R945 Abnormal results of liver function studies: Secondary | ICD-10-CM | POA: Diagnosis not present

## 2020-07-03 DIAGNOSIS — Z79899 Other long term (current) drug therapy: Secondary | ICD-10-CM | POA: Insufficient documentation

## 2020-07-03 DIAGNOSIS — I428 Other cardiomyopathies: Secondary | ICD-10-CM

## 2020-07-03 LAB — HEPATIC FUNCTION PANEL
ALT: 62 U/L — ABNORMAL HIGH (ref 0–44)
AST: 44 U/L — ABNORMAL HIGH (ref 15–41)
Albumin: 3.6 g/dL (ref 3.5–5.0)
Alkaline Phosphatase: 191 U/L — ABNORMAL HIGH (ref 38–126)
Bilirubin, Direct: 0.1 mg/dL (ref 0.0–0.2)
Indirect Bilirubin: 0.4 mg/dL (ref 0.3–0.9)
Total Bilirubin: 0.5 mg/dL (ref 0.3–1.2)
Total Protein: 7.9 g/dL (ref 6.5–8.1)

## 2020-07-03 LAB — BASIC METABOLIC PANEL
Anion gap: 9 (ref 5–15)
BUN: 17 mg/dL (ref 8–23)
CO2: 27 mmol/L (ref 22–32)
Calcium: 8.9 mg/dL (ref 8.9–10.3)
Chloride: 97 mmol/L — ABNORMAL LOW (ref 98–111)
Creatinine, Ser: 0.66 mg/dL (ref 0.44–1.00)
GFR calc Af Amer: >60 mL/min (ref >60)
GFR calc non Af Amer: >60 mL/min (ref >60)
Glucose, Bld: 236 mg/dL — ABNORMAL HIGH (ref 70–99)
Potassium: 4 mmol/L (ref 3.5–5.1)
Sodium: 133 mmol/L — ABNORMAL LOW (ref 135–145)

## 2020-07-06 ENCOUNTER — Other Ambulatory Visit: Payer: Self-pay | Admitting: Family Medicine

## 2020-07-06 DIAGNOSIS — R945 Abnormal results of liver function studies: Secondary | ICD-10-CM

## 2020-07-06 DIAGNOSIS — R7989 Other specified abnormal findings of blood chemistry: Secondary | ICD-10-CM

## 2020-07-06 MED ORDER — DOXYCYCLINE HYCLATE 100 MG PO TABS: 100.0000 mg | ORAL_TABLET | Freq: Two times a day (BID) | ORAL | 0 refills | Status: DC

## 2020-07-06 NOTE — Addendum Note (Signed)
Addended by: Caryl Bis, Letroy Vazguez G on: 07/06/2020 11:10 AM   Modules accepted: Orders

## 2020-07-10 ENCOUNTER — Telehealth: Payer: Self-pay

## 2020-07-10 MED ORDER — AMOXICILLIN-POT CLAVULANATE 875-125 MG PO TABS: 1.0000 | ORAL_TABLET | Freq: Two times a day (BID) | ORAL | 0 refills | Status: DC

## 2020-07-10 NOTE — Telephone Encounter (Signed)
Kristina Mccoy spoke with the patient last week regarding this.

## 2020-07-10 NOTE — Telephone Encounter (Signed)
I do not need to see her to send something else in. I sent in augmentin in for her. Please see if her abdominal pain resolved after stopping the doxycycline. Thanks.

## 2020-07-10 NOTE — Telephone Encounter (Signed)
Pt said she needs another antibiotic called in besides the doxycycline? She said the medication is causing her to have severe abdominal pain so she stopped taking the medication. She also would like to know if he wants to see her in person or will he just call her in something else.

## 2020-07-10 NOTE — Telephone Encounter (Signed)
Pt said she needs another antibiotic called in besides the doxycycline? She said the medication is causing her to have severe abdominal pain so she stopped taking the medication. She also would like to know if he wants to see her in person or will he just call her in something else. Lizzie Cokley,cma

## 2020-07-11 ENCOUNTER — Other Ambulatory Visit: Payer: Self-pay | Admitting: Family Medicine

## 2020-07-11 DIAGNOSIS — E559 Vitamin D deficiency, unspecified: Secondary | ICD-10-CM | POA: Diagnosis not present

## 2020-07-11 DIAGNOSIS — E785 Hyperlipidemia, unspecified: Secondary | ICD-10-CM | POA: Diagnosis not present

## 2020-07-11 DIAGNOSIS — R945 Abnormal results of liver function studies: Secondary | ICD-10-CM

## 2020-07-11 DIAGNOSIS — R7989 Other specified abnormal findings of blood chemistry: Secondary | ICD-10-CM

## 2020-07-11 DIAGNOSIS — Z794 Long term (current) use of insulin: Secondary | ICD-10-CM | POA: Diagnosis not present

## 2020-07-11 DIAGNOSIS — E1169 Type 2 diabetes mellitus with other specified complication: Secondary | ICD-10-CM | POA: Diagnosis not present

## 2020-07-11 DIAGNOSIS — E1165 Type 2 diabetes mellitus with hyperglycemia: Secondary | ICD-10-CM | POA: Diagnosis not present

## 2020-07-11 NOTE — Telephone Encounter (Signed)
I called and informed the patient that the provider sent in a different antibiotic, she stated her abdominal pain subsided once she stopped the doxycycline. Kristina Mccoy,cma

## 2020-07-11 NOTE — Telephone Encounter (Signed)
Noted  

## 2020-07-17 ENCOUNTER — Ambulatory Visit
Admission: RE | Admit: 2020-07-17 | Discharge: 2020-07-17 | Disposition: A | Discharge status: Home / Self Care | Payer: PPO | Source: Ambulatory Visit | Attending: Family Medicine | Admitting: Family Medicine

## 2020-07-17 ENCOUNTER — Other Ambulatory Visit: Payer: Self-pay

## 2020-07-17 DIAGNOSIS — R7989 Other specified abnormal findings of blood chemistry: Secondary | ICD-10-CM

## 2020-07-17 DIAGNOSIS — R945 Abnormal results of liver function studies: Secondary | ICD-10-CM | POA: Diagnosis not present

## 2020-07-17 DIAGNOSIS — R109 Unspecified abdominal pain: Secondary | ICD-10-CM | POA: Diagnosis not present

## 2020-07-18 DIAGNOSIS — N904 Leukoplakia of vulva: Secondary | ICD-10-CM | POA: Diagnosis not present

## 2020-07-25 ENCOUNTER — Other Ambulatory Visit: Payer: Self-pay

## 2020-07-25 ENCOUNTER — Other Ambulatory Visit (INDEPENDENT_AMBULATORY_CARE_PROVIDER_SITE_OTHER): Payer: PPO

## 2020-07-25 ENCOUNTER — Other Ambulatory Visit: Payer: Self-pay | Admitting: Family Medicine

## 2020-07-25 DIAGNOSIS — R7989 Other specified abnormal findings of blood chemistry: Secondary | ICD-10-CM

## 2020-07-25 DIAGNOSIS — R945 Abnormal results of liver function studies: Secondary | ICD-10-CM

## 2020-07-25 LAB — HEPATIC FUNCTION PANEL
ALT: 41 U/L — ABNORMAL HIGH (ref 0–35)
AST: 35 U/L (ref 0–37)
Albumin: 3.8 g/dL (ref 3.5–5.2)
Alkaline Phosphatase: 154 U/L — ABNORMAL HIGH (ref 39–117)
Bilirubin, Direct: 0.1 mg/dL (ref 0.0–0.3)
Total Bilirubin: 0.3 mg/dL (ref 0.2–1.2)
Total Protein: 6.9 g/dL (ref 6.0–8.3)

## 2020-07-27 ENCOUNTER — Ambulatory Visit: Payer: PPO | Admitting: Internal Medicine

## 2020-07-27 ENCOUNTER — Encounter: Payer: Self-pay | Admitting: Internal Medicine

## 2020-07-27 ENCOUNTER — Ambulatory Visit (INDEPENDENT_AMBULATORY_CARE_PROVIDER_SITE_OTHER): Payer: PPO

## 2020-07-27 ENCOUNTER — Other Ambulatory Visit: Payer: Self-pay

## 2020-07-27 VITALS — BP 112/64 | HR 77 | Ht 64.0 in | Wt 150.0 lb

## 2020-07-27 DIAGNOSIS — R079 Chest pain, unspecified: Secondary | ICD-10-CM | POA: Diagnosis not present

## 2020-07-27 DIAGNOSIS — I428 Other cardiomyopathies: Secondary | ICD-10-CM

## 2020-07-27 LAB — ECHOCARDIOGRAM LIMITED
S' Lateral: 3.3 cm
Single Plane A4C EF: 54.3 %

## 2020-07-27 NOTE — Progress Notes (Signed)
Follow-up Outpatient Visit Date: 07/27/2020  Primary Care Provider: [name], MD [address] [place] [address]  Chief Complaint: Chest pain  HPI:  Ms. Kristina Mccoy is a 64 y.o. female with history of Sjogren's disease with sicca syndrome, hyperlipidemia, diabetes mellitus, fibromyalgia, neuromyositis, and sleep apneaon CPAP, who presents for follow-up of chest pain and shortness of breath.  I last saw her in mid September, at which time Ms. Caton reported increasing fatigue and pain throughout her body.  She was awaiting rheumatology consultation at [facility].  She continued to complain of frequent shortness of breath at rest and with activity, as well as intermittent nonexertional chest pain.  We agreed to add losartan 12.5 mg daily in the setting of mildly reduced LVEF by echo in March.  [name] reached out to [place] a few weeks later due to intermittent low blood pressures.  Losartan was discontinued.  Today, Ms. Standard reports that she is feeling a little bit worse with more frequent chest pain. It still tends to come on late in the day after having been busy. At times, it seems to be primarily in her back. She wonders if her Sjogren's disease could be contributing to this. She also reports orthopnea, more pronounced than in the past. She has not had any palpitations, lightheadedness, or edema. In hindsight, she feels like maybe her chest discomfort was a little bit better while she was on the losartan, though she remains off of this due to associated low blood pressures and lightheadedness.  --------------------------------------------------------------------------------------------------  Cardiovascular History & Procedures: Cardiovascular Problems:  Chest pain and shortness of breath  NICM  Risk Factors:  Hyperlipidemia and diabetes mellitus  Cath/PCI:  R/LHC (03/28/2015):No significant coronary artery disease. Normal left and right heart filling pressures.  Low normal Fick cardiac output.  CV Surgery:  None  EP Procedures and Devices:  None  Non-Invasive Evaluation(s):  Pharmacologic myocardial perfusion stress test (02/14/2020): Small mid/apical anterior and apical defect most likely representing breast attenuation but cannot rule out scar.  No significant ischemia noted.  LVEF greater than 65%.  Study low risk no degraded by breast attenuation and extracardiac activity.  TTE (12/21/2019): Normal LV size with mild LVH. LVEF 45-50% with global hypokinesis and normal diastolic function. Normal RV size and function. Trivial mitral regurgitation. Normal CVP.  ABIs (12/21/2019): Normal.  Pharmacologic MPI (12/14/2014): Mild anterior and apical ischemia. LVEF 54%.  Recent CV Pertinent Labs: Lab Results  Component Value Date   CHOL 289 (H) 04/28/2020   CHOL 113 09/03/2014   HDL 54 04/28/2020   HDL 16 (L) 09/03/2014   LDLCALC 200 (H) 04/28/2020   LDLCALC 76 09/03/2014   LDLDIRECT 153.0 06/19/2020   TRIG 177 (H) 04/28/2020   TRIG 107 09/03/2014   CHOLHDL 5.4 (H) 04/28/2020   INR 1.00 04/22/2017   BNP 3,333 (H) 09/03/2014   K 4.0 07/03/2020   K 3.9 09/03/2014   MG 1.9 08/17/2019   BUN 17 07/03/2020   BUN 11 02/19/2019   BUN 10 09/03/2014   CREATININE 0.66 07/03/2020   CREATININE 1.10 (H) 04/28/2020    Past medical and surgical history were reviewed and updated in EPIC.  Current Meds  Medication Sig  . albuterol (VENTOLIN HFA) 108 (90 Base) MCG/ACT inhaler Inhale 2 puffs into the lungs every 4 (four) hours as needed for wheezing or shortness of breath.  . ALPRAZolam (XANAX) 0.5 MG tablet TAKE 1 TABLET (0.5 MG TOTAL) BY MOUTH 4 (FOUR) TIMES DAILY AS  NEEDED FOR ANXIETY.  Marland Kitchen aspirin EC 81 MG tablet Take 1 tablet (81 mg total) by mouth daily.  . baclofen (LIORESAL) 10 MG tablet Take 10 mg by mouth 2 (two) times daily.  . Cholecalciferol (VITAMIN D3) 1000 units CAPS Take by mouth daily.   . clobetasol ointment (TEMOVATE) 0.05  % Apply topically. Vaginally  . Continuous Blood Gluc Sensor (FREESTYLE LIBRE 14 DAY SENSOR) MISC SMARTSIG:1 Each Topical Every 2 Weeks  . Cyanocobalamin 1000 MCG CAPS Take by mouth. Takes occassionally  . dicyclomine (BENTYL) 10 MG capsule Take 1 capsule (10 mg total) by mouth 3 (three) times daily as needed for spasms.  . Emollient (CERAVE) CREA Apply topically daily.   Marland Kitchen FREESTYLE LITE test strip CHECK 6 TIMES DAILY  . glucose blood (ONE TOUCH ULTRA TEST) test strip CHECK SIX TIMES DAILY  . insulin aspart (NOVOLOG FLEXPEN) 100 UNIT/ML FlexPen Take up to 2 - 4 units before meals up to 3 times daily  . Insulin Syringe-Needle U-100 (INSULIN SYRINGE .5CC/31GX5/16") 31G X 5/16" 0.5 ML MISC Use twice daily with injections  . ketoconazole (NIZORAL) 2 % cream Apply 1 application topically 2 (two) times daily.  Marland Kitchen ketoconazole (NIZORAL) 2 % shampoo Apply 1 application topically once a week.  . Lancets MISC Use. BAYER CONTOUR LANCETS  . losartan (COZAAR) 25 MG tablet Take 0.5 tablets (12.5 mg total) by mouth daily.  . mupirocin ointment (BACTROBAN) 2 % Apply two times a day for 7 days.  Marland Kitchen nystatin cream (MYCOSTATIN) Apply 1 application topically as needed.  . ondansetron (ZOFRAN) 4 MG tablet TAKE 1 TABLET BY MOUTH EVERY 8 HOURS AS NEEDED FOR NAUSEA AND VOMITING  . ONETOUCH DELICA LANCETS 77L MISC TWICE DAILY  . Polyethyl Glycol-Propyl Glycol (SYSTANE OP) Apply to eye.  . pseudoephedrine-guaifenesin (MUCINEX D) 60-600 MG 12 hr tablet Take 1 tablet by mouth 2 (two) times daily as needed.   . rosuvastatin (CRESTOR) 40 MG tablet Take 1 tablet (40 mg total) by mouth daily.  . Tiotropium Bromide Monohydrate (SPIRIVA RESPIMAT) 1.25 MCG/ACT AERS Inhale 2 puffs into the lungs daily.  . traMADol (ULTRAM) 50 MG tablet TAKE 1 TABLET BY MOUTH EVERY 6 HOURS AS NEEDED. FOR PAIN  . TRESIBA FLEXTOUCH 100 UNIT/ML SOPN FlexTouch Pen 20 Units at bedtime.  . triamcinolone (NASACORT ALLERGY 24HR) 55 MCG/ACT AERO nasal  inhaler Place 2 sprays into the nose daily.    Allergies: Gabapentin, Oxycodone, Carbamazepine, Dulaglutide, Insulin glargine, Levaquin [levofloxacin], Metformin, Methylprednisolone, Morphine, Pregabalin, Venlafaxine, Buprenorphine, Canagliflozin, Escitalopram, and Hydroxychloroquine  Social History   Tobacco Use  . Smoking status: Former Smoker    Packs/day: 1.00    Years: 30.00    Pack years: 30.00    Types: Cigarettes    Quit date: 02/28/2013    Years since quitting: 7.4  . Smokeless tobacco: Never Used  Vaping Use  . Vaping Use: Never used  Substance Use Topics  . Alcohol use: No    Alcohol/week: 0.0 standard drinks  . Drug use: No    Family History  Problem Relation Age of Onset  . Diabetes Mother   . Heart disease Mother   . Hyperlipidemia Mother   . Heart failure Mother   . Diabetes Other   . Heart disease Brother        Aortic valve disease  . Hyperlipidemia Brother   . Colon polyps Brother   . Stroke Maternal Grandmother   . Breast cancer Maternal Aunt   . Colon cancer Paternal Grandmother   .  Rheum arthritis Sister   . Psoriasis Other        psoriatic arthritis   . Heart disease Brother 51       Tachycardia  . Heart disease Brother   . Ovarian cancer Neg Hx     Review of Systems: A 12-system review of systems was performed and was negative except as noted in the HPI.  --------------------------------------------------------------------------------------------------  Physical Exam: BP 112/64   Pulse 77   Ht 5\' 4"  (1.626 m)   Wt 150 lb (68 kg)   BMI 25.75 kg/m   General: NAD. Neck: No JVD or HJR. Lungs: Clear to auscultation bilaterally without wheezes or crackles. Heart: Regular rate and rhythm without murmurs, rubs, or gallops. Abdomen: Soft, nontender, nondistended. Extremities: No lower extremity edema.  EKG (06/14/2020): Normal sinus rhythm without abnormality.  Lab Results  Component Value Date   WBC 10.8 02/19/2019   HGB 13.5  02/19/2019   HCT 40.4 02/19/2019   MCV 92 02/19/2019   PLT 278 02/19/2019    Lab Results  Component Value Date   NA 133 (L) 07/03/2020   K 4.0 07/03/2020   CL 97 (L) 07/03/2020   CO2 27 07/03/2020   BUN 17 07/03/2020   CREATININE 0.66 07/03/2020   GLUCOSE 236 (H) 07/03/2020   ALT 41 (H) 07/25/2020    Lab Results  Component Value Date   CHOL 289 (H) 04/28/2020   HDL 54 04/28/2020   LDLCALC 200 (H) 04/28/2020   LDLDIRECT 153.0 06/19/2020   TRIG 177 (H) 04/28/2020   CHOLHDL 5.4 (H) 04/28/2020    --------------------------------------------------------------------------------------------------  ASSESSMENT AND PLAN: NICM: Ms. Aube appears euvolemic on exam.  I have reviewed her limited echo today, which shows relatively stable LVEF (previously 45-50%).  She did not tolerate low-dose losartan; we will therefore defer rechallenging her with ACEI/ARB as well as beta-blocker.  Given progressive chest pain, I have recommended that we proceed with [facility] and possible PCI.  Ms. [name] would like to think about this.  She will let us know if she wishes to proceed.  If catheterization is unrevealing, we may ultimately need to consider cardiac MRI, as Ms. Falkner is very concerned that her Sjogren's syndrome could be causing inflammation of her heart.  Chest pain: Pain is chronic but seems to have worsened since our last visit.  She describes a tightness in her chest and upper back, usually towards the [place]han[place] Swallows of the day.  Myocardial perfusion stress test for evaluation of the same pain earlier this year was low risk.  However, given that her symptoms have progressed, I have recommended that we perform a right and left heart catheterization to exclude CAD and false negative stress test.  I have reviewed the risks, indications, and alternatives to cardiac catheterization, possible angioplasty, and stenting with the patient. Risks include but are not limited to bleeding, infection, vascular injury,  stroke, myocardial infection, arrhythmia, kidney injury, radiation-related injury in the case of prolonged fluoroscopy use, emergency cardiac surgery, and death. The patient understands the risks of serious complication is 1-2 in [id number] with diagnostic cardiac cath and 1-2% or less with angioplasty/stenting.  Ms. Elms would like to think about this.  Follow-up: Return to clinic in 1 month.  Nelva Bush, MD 07/27/2020 12:28 PM

## 2020-07-27 NOTE — Patient Instructions (Signed)
Medication Instructions:  Your physician recommends that you continue on your current medications as directed. Please refer to the Current Medication list given to you today.  *If you need a refill on your cardiac medications before your next appointment, please call your pharmacy*  Lab Work: Pending cath  If you have labs (blood work) drawn today and your tests are completely normal, you will receive your results only by: Marland Kitchen MyChart Message (if you have MyChart) OR . A paper copy in the mail If you have any lab test that is abnormal or we need to change your treatment, we will call you to review the results.   Testing/Procedures:   Please call if you are agreeable to Right and Left Heart catheterization.  I am including the instructions for you to reference in case we do schedule it for you.   You are scheduled for a Cardiac Catheterization on __________________, ________  with Dr. Harrell Gave End.  1. Please arrive at the [facility] at __________  (This time is one hour before your procedure to ensure your preparation). Free valet parking service is available.   Special note: Every effort is made to have your procedure done on time. Please understand that emergencies sometimes delay scheduled procedures.  2. Diet: Do not eat solid foods after midnight.  The patient may have clear liquids until 5am upon the day of the procedure.  3. Labs: You will need to have blood drawn on ______________________. You do not need to be fasting.  COVID PRE- TEST: You will need a COVID TEST prior to the procedure:  LOCATION: [address].  DATE/TIME:  _____________________________anytime between 8 am and 1 pm.   4. Medication instructions in preparation for your procedure:   Contrast Allergy: No    Take only 0 units of insulin the night before your procedure. Do not take any insulin on the day of the procedure.    On the morning of your procedure,  take your Aspirin and any morning medicines NOT listed above.  You may use sips of water.  5. Plan for one night stay--bring personal belongings. 6. Bring a current list of your medications and current insurance cards. 7. You MUST have a responsible person to drive you home. 8. Someone MUST be with you the first 24 hours after you arrive home or your discharge will be delayed. 9. Please wear clothes that are easy to get on and off and wear slip-on shoes.  Thank you for allowing [place] to care for you!   -- [facility]    Follow-Up: At [facility], you and your health needs are our priority.  As part of our continuing mission to provide you with exceptional heart care, we have created designated Provider Care Teams.  These Care Teams include your primary Cardiologist (physician) and Advanced Practice Providers (APPs -  Physician Assistants and Nurse Practitioners) who all work together to provide you with the care you need, when you need it.  We recommend signing up for the patient portal called "MyChart".  Sign up information is provided on this After Visit Summary.  MyChart is used to connect with patients for Virtual Visits (Telemedicine).  Patients are able to view lab/test results, encounter notes, upcoming appointments, etc.  Non-urgent messages can be sent to your provider as well.   To learn more about what you can do with MyChart, go to [email or web address].    Your next appointment:   1  month(s)  The format for your next appointment:   In Person  Provider:   You may see Nelva Bush, MD or one of the following Advanced Practice Providers on your designated Care Team:    Murray Hodgkins, NP  Christell Faith, PA-C  Marrianne Mood, PA-C  Cadence Kathlen Mody, [place]    Coronary Angiogram With Stent Coronary angiogram with stent placement is a procedure to widen or open a narrow blood vessel of the heart (coronary artery). Arteries may become  blocked by cholesterol buildup (plaques) in the lining of the artery wall. When a coronary artery becomes partially blocked, blood flow to that area decreases. This may lead to chest pain or a heart attack (myocardial infarction). A stent is a small piece of metal that looks like mesh or spring. Stent placement may be done as treatment after a heart attack, or to prevent a heart attack if a blocked artery is found by a coronary angiogram. Let your health care provider know about:  Any allergies you have, including allergies to medicines or contrast dye.  All medicines you are taking, including vitamins, herbs, eye drops, creams, and over-the-counter medicines.  Any problems you or family members have had with anesthetic medicines.  Any blood disorders you have.  Any surgeries you have had.  Any medical conditions you have, including kidney problems or kidney failure.  Whether you are pregnant or may be pregnant.  Whether you are breastfeeding. What are the risks? Generally, this is a safe procedure. However, serious problems may occur, including:  Damage to nearby structures or organs, such as the heart, blood vessels, or kidneys.  A return of blockage.  Bleeding, infection, or bruising at the insertion site.  A collection of blood under the skin (hematoma) at the insertion site.  A blood clot in another part of the body.  Allergic reaction to medicines or dyes.  Bleeding into the abdomen (retroperitoneal bleeding).  Stroke (rare).  Heart attack (rare). What happens before the procedure? Staying hydrated Follow instructions from your health care provider about hydration, which may include:  Up to 2 hours before the procedure - you may continue to drink clear liquids, such as water, clear fruit juice, black coffee, and plain tea.  Eating and drinking restrictions Follow instructions from your health care provider about eating and drinking, which may include:  8 hours  before the procedure - stop eating heavy meals or foods, such as meat, fried foods, or fatty foods.  6 hours before the procedure - stop eating light meals or foods, such as toast or cereal.  2 hours before the procedure - stop drinking clear liquids. Medicines Ask your health care provider about:  Changing or stopping your regular medicines. This is especially important if you are taking diabetes medicines or blood thinners.  Taking medicines such as aspirin and ibuprofen. These medicines can thin your blood. Do not take these medicines unless your health care provider tells you to take them. ? Generally, aspirin is recommended before a thin tube, called a catheter, is passed through a blood vessel and inserted into the heart (cardiac catheterization).  Taking over-the-counter medicines, vitamins, herbs, and supplements. General instructions  Do not use any products that contain nicotine or tobacco for at least 4 weeks before the procedure. These products include cigarettes, e-cigarettes, and chewing tobacco. If you need help quitting, ask your health care provider.  Plan to have someone take you home from the hospital or clinic.  If you will be going home  right after the procedure, plan to have someone with you for 24 hours.  You may have tests and imaging procedures.  Ask your health care provider: ? How your insertion site will be marked. Ask which artery will be used for the procedure. ? What steps will be taken to help prevent infection. These may include:  Removing hair at the insertion site.  Washing skin with a germ-killing soap.  Taking antibiotic medicine. What happens during the procedure?   An IV will be inserted into one of your veins.  Electrodes may be placed on your chest to monitor your heart rate during the procedure.  You will be given one or more of the following: ? A medicine to help you relax (sedative). ? A medicine to numb the area (local anesthetic)  for catheter insertion.  A small incision will be made for catheter insertion.  The catheter will be inserted into an artery using a guide wire. The location may be in your groin, your wrist, or the fold of your arm (near your elbow).  An X-ray procedure (fluoroscopy) will be used to help guide the catheter to the opening of the heart arteries.  A dye will be injected into the catheter. X-rays will be taken. The dye helps to show where any narrowing or blockages are located in the arteries.  Tell your health care provider if you have chest pain or trouble breathing.  A tiny wire will be guided to the blocked spot, and a balloon will be inflated to make the artery wider.  The stent will be expanded to crush the plaques into the wall of the vessel. The stent will hold the area open and improve the blood flow. Most stents have a drug coating to reduce the risk of the stent narrowing over time.  The artery may be made wider using a drill, laser, or other tools that remove plaques.  The catheter will be removed when the blood flow improves. The stent will stay where it was placed, and the lining of the artery will grow over it.  A bandage (dressing) will be placed on the insertion site. Pressure will be applied to stop bleeding.  The IV will be removed. This procedure may vary among health care providers and hospitals. What happens after the procedure?  Your blood pressure, heart rate, breathing rate, and blood oxygen level will be monitored until you leave the hospital or clinic.  If the procedure is done through the leg, you will lie flat in bed for a few hours or for as long as told by your health care provider. You will be instructed not to bend or cross your legs.  The insertion site and the pulse in your foot or wrist will be checked often.  You may have more blood tests, X-rays, and a test that records the electrical activity of your heart (electrocardiogram, or ECG).  Do not  drive for 24 hours if you were given a sedative during your procedure. Summary  Coronary angiogram with stent placement is a procedure to widen or open a narrowed coronary artery. This is done to treat heart problems.  Before the procedure, let your health care provider know about all the medical conditions and surgeries you have or have had.  This is a safe procedure. However, some problems may occur, including damage to nearby structures or organs, bleeding, blood clots, or allergies.  Follow your health care provider's instructions about eating, drinking, medicines, and other lifestyle changes, such as quitting  tobacco use before the procedure. This information is not intended to replace advice given to you by your health care provider. Make sure you discuss any questions you have with your health care provider. Document Revised: 04/07/2019 Document Reviewed: 04/07/2019 Elsevier Patient Education  [place].

## 2020-07-28 ENCOUNTER — Telehealth: Payer: Self-pay

## 2020-07-28 ENCOUNTER — Telehealth: Payer: Self-pay | Admitting: *Deleted

## 2020-07-28 DIAGNOSIS — R079 Chest pain, unspecified: Secondary | ICD-10-CM

## 2020-07-28 DIAGNOSIS — Z79899 Other long term (current) drug therapy: Secondary | ICD-10-CM

## 2020-07-28 NOTE — Telephone Encounter (Signed)
Patient sent MyChart message saying she would like to have right/left heart cath on 08/01/20 with Dr End. I sent back a reply with further information and answered some of her questions. Called to go over the details with her.  No answer. Left message to call back.

## 2020-07-28 NOTE — Telephone Encounter (Signed)
Called patient back. She verbalized understanding of the pre-procedural instructions. To arrive on 08/01/20 at 8:30 am to the [facility]. Lab work and COVID test on 08/01/20 as early in the morning at possible.  She has the instructions from AVS yesterday with the detailed information.

## 2020-07-28 NOTE — Telephone Encounter (Signed)
Patient calling.  Wanting to schedule Left and right heart Cath.

## 2020-07-28 NOTE — Telephone Encounter (Signed)
Duplicate encounter. See telephone note from earlier today.

## 2020-07-31 ENCOUNTER — Other Ambulatory Visit: Admission: RE | Admit: 2020-07-31 | Payer: PPO | Source: Ambulatory Visit

## 2020-07-31 ENCOUNTER — Other Ambulatory Visit: Payer: Self-pay

## 2020-07-31 ENCOUNTER — Telehealth: Payer: Self-pay | Admitting: Internal Medicine

## 2020-07-31 ENCOUNTER — Ambulatory Visit (INDEPENDENT_AMBULATORY_CARE_PROVIDER_SITE_OTHER): Payer: PPO | Admitting: Family Medicine

## 2020-07-31 ENCOUNTER — Encounter: Payer: Self-pay | Admitting: Family Medicine

## 2020-07-31 VITALS — BP 118/70 | HR 90 | Temp 98.3°F | Ht 64.0 in | Wt 151.2 lb

## 2020-07-31 DIAGNOSIS — I428 Other cardiomyopathies: Secondary | ICD-10-CM

## 2020-07-31 DIAGNOSIS — E785 Hyperlipidemia, unspecified: Secondary | ICD-10-CM | POA: Diagnosis not present

## 2020-07-31 DIAGNOSIS — G894 Chronic pain syndrome: Secondary | ICD-10-CM

## 2020-07-31 DIAGNOSIS — R1011 Right upper quadrant pain: Secondary | ICD-10-CM | POA: Diagnosis not present

## 2020-07-31 DIAGNOSIS — F419 Anxiety disorder, unspecified: Secondary | ICD-10-CM | POA: Diagnosis not present

## 2020-07-31 DIAGNOSIS — J309 Allergic rhinitis, unspecified: Secondary | ICD-10-CM | POA: Diagnosis not present

## 2020-07-31 DIAGNOSIS — K824 Cholesterolosis of gallbladder: Secondary | ICD-10-CM

## 2020-07-31 DIAGNOSIS — F32A Depression, unspecified: Secondary | ICD-10-CM | POA: Diagnosis not present

## 2020-07-31 DIAGNOSIS — R7989 Other specified abnormal findings of blood chemistry: Secondary | ICD-10-CM | POA: Diagnosis not present

## 2020-07-31 DIAGNOSIS — G4700 Insomnia, unspecified: Secondary | ICD-10-CM

## 2020-07-31 MED ORDER — TRAZODONE HCL 50 MG PO TABS: 25.0000 mg | ORAL_TABLET | Freq: Every evening | ORAL | 3 refills | Status: DC | PRN

## 2020-07-31 MED ORDER — REPATHA SURECLICK 140 MG/ML ~~LOC~~ SOAJ: 140.0000 mg | SUBCUTANEOUS | 1 refills | Status: DC

## 2020-07-31 MED ORDER — MONTELUKAST SODIUM 10 MG PO TABS: 10.0000 mg | ORAL_TABLET | Freq: Every day | ORAL | 3 refills | Status: DC

## 2020-07-31 NOTE — Progress Notes (Signed)
[name], MD Phone: [phone]  Kristina Mccoy is a 64 y.o. female who presents today for f/u.  Chronic abdominal pain: Patient has chronic right upper quadrant discomfort that has been slightly increased recently only when she eats.  Feels as though it is gas.  Pepcid did not help.  She has follow-up with GI in December.  Her LFTs were elevated previously though that coincided with starting a statin and her taking Tylenol 2-4 times per day.  She has decreased her Tylenol use and she is no longer on a statin and her LFTs have been trending down.  She had an ultrasound recently that had a gallbladder polyp.  No follow-up was recommended by radiology.  Chronic allergic rhinitis: She has ongoing issues with this for years.  Has sinus pressure and headache intermittently.  She sees ENT and they want to do surgery though she is unsure about that.  Previously she was treated for a sinus infection about a month and a half ago and notes that the antibiotics did help her symptoms.  She had a negative Covid test at that time.  She does take Mucinex D and uses Nasacort.  Chronic pain: Taken tramadol though tries not to take it very often.  It does not make her drowsy.  Sleeping difficulty: Patient notes she is not sleeping very good regularly.  She keeps a regular schedule.  No TV before bed.  No caffeine later in the day.  No alcohol intake.  Melatonin was not beneficial.  She does note some anxiety and mild depression.  Social History   Tobacco Use  Smoking Status Former Smoker   Packs/day: 1.00   Years: 30.00   Pack years: 30.00   Types: Cigarettes   Quit date: 02/28/2013   Years since quitting: 7.4  Smokeless Tobacco Never Used     ROS see history of present illness  Objective  Physical Exam Vitals:   07/31/20 1414  BP: 118/70  Pulse: 90  Temp: 98.3 F (36.8 C)  SpO2: 98%    BP Readings from Last 3 Encounters:  07/31/20 118/70  07/27/20 112/64  06/14/20 122/62     Wt Readings from Last 3 Encounters:  07/31/20 151 lb 3.2 oz (68.6 kg)  07/27/20 150 lb (68 kg)  06/14/20 151 lb (68.5 kg)    Physical Exam Constitutional:      General: She is not in acute distress.    Appearance: She is not diaphoretic.  Cardiovascular:     Rate and Rhythm: Normal rate and regular rhythm.     Heart sounds: Normal heart sounds.  Pulmonary:     Effort: Pulmonary effort is normal.     Breath sounds: Normal breath sounds.  Abdominal:     General: Bowel sounds are normal. There is no distension.     Palpations: Abdomen is soft.     Tenderness: There is no guarding or rebound.     Comments: Mild right upper quadrant tenderness  Musculoskeletal:     Right lower leg: No edema.     Left lower leg: No edema.  Skin:    General: Skin is warm and dry.  Neurological:     Mental Status: She is alert.      Assessment/Plan: Please see individual problem list.  Problem List Items Addressed This Visit    Allergic rhinitis - Primary    Chronic ongoing symptoms with this.  We will try adding Singulair 10 mg once daily.  Discussed risk of anxiety and  depression worsening with this.  She develops the symptoms she will let us know.  She can continue Nasacort and Mucinex.      Relevant Medications   montelukast (SINGULAIR) 10 MG tablet   Anxiety and depression    I suspect this is contributing to her difficulty sleeping.  We will try adding trazodone 25-50 mg once daily to see if that is beneficial for her.  If not improving she will let us know.  Follow-up in 3 months.      Relevant Medications   traZODone (DESYREL) 50 MG tablet   Chronic pain disorder (Chronic)    Stable.  She can continue tramadol 50 mg every 6 hours as needed for pain.      Relevant Medications   traZODone (DESYREL) 50 MG tablet   Elevated LFTs    These have been trending down with stopping her statin and decreasing her Tylenol use.  I suspect acute elevation was related to those things.  She  does have chronic mild elevation which is likely related to her autoimmune issues.  She will see GI as planned.      Hyperlipidemia    Intolerant of statins related to elevated LFTs.  We will try to get Repatha approved for her.      Relevant Medications   Evolocumab (REPATHA SURECLICK) 570 MG/ML SOAJ   Insomnia   Relevant Medications   traZODone (DESYREL) 50 MG tablet   Non-ischemic cardiomyopathy (HCC)   Relevant Medications   Evolocumab (REPATHA SURECLICK) 177 MG/ML SOAJ   RUQ pain    Chronic ongoing issue.  She did have a gallbladder polyp on her ultrasound though I am not sure if that is contributing to her symptoms.  Will refer to general surgery to get their input.  She will keep her appointment with the GI physician in December.      Relevant Orders   Ambulatory referral to General Surgery    Other Visit Diagnoses    Gall bladder polyp       Relevant Orders   Ambulatory referral to General Surgery       This visit occurred during the SARS-CoV-2 public health emergency.  Safety protocols were in place, including screening questions prior to the visit, additional usage of staff PPE, and extensive cleaning of exam room while observing appropriate contact time as indicated for disinfecting solutions.    [name], MD [place]

## 2020-07-31 NOTE — Telephone Encounter (Signed)
Spoke with Kristina Mccoy. She is having bas sinus headaches and her ride is also not feeling well. She would like to reschedule. I gave her the dates Dr End is in the cath lab [facility].  She will call us back to reschedule.

## 2020-07-31 NOTE — Telephone Encounter (Signed)
Patient calling in wanting to cancel cardiac cath for 11/2. Patient states she has been experiencing bad sinus headaches over the weekend and wants to focus on that now. Patient states she will call back when she is ready to schedule again

## 2020-07-31 NOTE — Patient Instructions (Signed)
Nice to see you. We will try Singulair to see if that will help with your chronic sinus issues. I have referred you to general surgery for your gallbladder polyp to see if they think that is contributing to your stomach pain. Please keep your new patient appointment with Dr. Haig Prophet. We will try trazodone to see if that will help with your sleep.  If it makes you excessively drowsy please let us know and do not drive. We will see if we can get you approved for Repatha for your cholesterol.

## 2020-08-01 ENCOUNTER — Encounter: Admission: RE | Payer: Self-pay | Source: Home / Self Care

## 2020-08-01 ENCOUNTER — Ambulatory Visit: Admission: RE | Admit: 2020-08-01 | Payer: PPO | Source: Home / Self Care | Admitting: Internal Medicine

## 2020-08-01 DIAGNOSIS — R079 Chest pain, unspecified: Secondary | ICD-10-CM

## 2020-08-01 SURGERY — RIGHT/LEFT HEART CATH AND CORONARY ANGIOGRAPHY
Anesthesia: Moderate Sedation | Laterality: Bilateral

## 2020-08-04 ENCOUNTER — Telehealth: Payer: Self-pay

## 2020-08-04 ENCOUNTER — Encounter: Payer: Self-pay | Admitting: Family Medicine

## 2020-08-04 DIAGNOSIS — E785 Hyperlipidemia, unspecified: Secondary | ICD-10-CM

## 2020-08-04 DIAGNOSIS — R7989 Other specified abnormal findings of blood chemistry: Secondary | ICD-10-CM | POA: Insufficient documentation

## 2020-08-04 NOTE — Assessment & Plan Note (Signed)
Intolerant of statins related to elevated LFTs.  We will try to get Repatha approved for her.

## 2020-08-04 NOTE — Assessment & Plan Note (Signed)
Chronic ongoing symptoms with this.  We will try adding Singulair 10 mg once daily.  Discussed risk of anxiety and depression worsening with this.  She develops the symptoms she will let us know.  She can continue Nasacort and Mucinex.

## 2020-08-04 NOTE — Assessment & Plan Note (Signed)
Stable.  She can continue tramadol 50 mg every 6 hours as needed for pain.

## 2020-08-04 NOTE — Assessment & Plan Note (Signed)
I suspect this is contributing to her difficulty sleeping.  We will try adding trazodone 25-50 mg once daily to see if that is beneficial for her.  If not improving she will let us know.  Follow-up in 3 months.

## 2020-08-04 NOTE — Telephone Encounter (Signed)
A PA was done for the patient for Repatha and this was approved until 09/30/2020.  Kristina Mccoy,cma

## 2020-08-04 NOTE — Assessment & Plan Note (Signed)
Chronic ongoing issue.  She did have a gallbladder polyp on her ultrasound though I am not sure if that is contributing to her symptoms.  Will refer to general surgery to get their input.  She will keep her appointment with the GI physician in December.

## 2020-08-04 NOTE — Assessment & Plan Note (Signed)
These have been trending down with stopping her statin and decreasing her Tylenol use.  I suspect acute elevation was related to those things.  She does have chronic mild elevation which is likely related to her autoimmune issues.  She will see GI as planned.

## 2020-08-07 DIAGNOSIS — Z9013 Acquired absence of bilateral breasts and nipples: Secondary | ICD-10-CM | POA: Diagnosis not present

## 2020-08-07 DIAGNOSIS — C50112 Malignant neoplasm of central portion of left female breast: Secondary | ICD-10-CM | POA: Diagnosis not present

## 2020-08-07 DIAGNOSIS — C50111 Malignant neoplasm of central portion of right female breast: Secondary | ICD-10-CM | POA: Diagnosis not present

## 2020-08-15 DIAGNOSIS — R0600 Dyspnea, unspecified: Secondary | ICD-10-CM | POA: Diagnosis not present

## 2020-08-15 DIAGNOSIS — I429 Cardiomyopathy, unspecified: Secondary | ICD-10-CM | POA: Diagnosis not present

## 2020-08-15 DIAGNOSIS — R0789 Other chest pain: Secondary | ICD-10-CM | POA: Diagnosis not present

## 2020-08-15 DIAGNOSIS — E782 Mixed hyperlipidemia: Secondary | ICD-10-CM | POA: Diagnosis not present

## 2020-08-18 ENCOUNTER — Other Ambulatory Visit: Payer: Self-pay

## 2020-08-18 ENCOUNTER — Encounter: Payer: Self-pay | Admitting: Internal Medicine

## 2020-08-18 ENCOUNTER — Ambulatory Visit: Payer: PPO | Admitting: Internal Medicine

## 2020-08-18 VITALS — BP 122/68 | HR 72 | Temp 97.1°F | Ht 64.0 in | Wt 151.8 lb

## 2020-08-18 DIAGNOSIS — C50112 Malignant neoplasm of central portion of left female breast: Secondary | ICD-10-CM | POA: Diagnosis not present

## 2020-08-18 DIAGNOSIS — J209 Acute bronchitis, unspecified: Secondary | ICD-10-CM

## 2020-08-18 DIAGNOSIS — J4520 Mild intermittent asthma, uncomplicated: Secondary | ICD-10-CM

## 2020-08-18 DIAGNOSIS — G4733 Obstructive sleep apnea (adult) (pediatric): Secondary | ICD-10-CM | POA: Diagnosis not present

## 2020-08-18 DIAGNOSIS — C50111 Malignant neoplasm of central portion of right female breast: Secondary | ICD-10-CM | POA: Diagnosis not present

## 2020-08-18 DIAGNOSIS — Z9013 Acquired absence of bilateral breasts and nipples: Secondary | ICD-10-CM | POA: Diagnosis not present

## 2020-08-18 MED ORDER — PREDNISONE 20 MG PO TABS: 10.0000 mg | ORAL_TABLET | Freq: Every day | ORAL | 1 refills | Status: DC

## 2020-08-18 MED ORDER — AZITHROMYCIN 250 MG PO TABS: ORAL_TABLET | ORAL | 0 refills | Status: DC

## 2020-08-18 NOTE — Patient Instructions (Signed)
Continue albuterol as prescribed Avoid secondhand smoke exposure Start prednisone 10 mg daily for 7 days Start Z-Pak Lung cancer screening referral with Melinda Crutch Continue CPAP as prescribed

## 2020-08-18 NOTE — Progress Notes (Signed)
[facility]   PFTs and mild restriction, RV 74, DLCO 61. Sleep study February 2016 AHI 14.3, AutoPap 5-15.  **Personally reviewed download data, 30 days as of 01/07/18.  Usage greater than 4 hours is 26/30 days.  Average usage on days used is 5 hours 50 minutes.  Set pressure is 8.  Residual AHI is 1.  This demonstrates very good compliance with excellent control of obstructive sleep apnea. **Sleep study, CPAP titration 04/09/16, CPAP was recommended at a pressure of 8.  **Imaging personally reviewed, 01/16/17; lungs unremarkable. **CBC 12/22/17, absolute eosinophil count 300.    Date: 08/18/2020  MRN# [id number] Kristina Mccoy 1956-01-21  CC Follow-up OSA Follow-up shortness of breath  HPI:   Assessment of OSA  Patient uses CPAP at centimeters water pressure  Patient with excellent compliance report 100% for days  Greater than 77% for greater than 4 hours  Patient wears full facemask   Patient has a history of Sjogren's syndrome and is being evaluated by [facility]  rheumatology   Patient has had significant cardiac issues in the past evaluated by Dr. 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 Abdominal pain, epigastric   . Abnormal chest CT 09/08/2014  . Allergic urticaria   . Blood clotting disorder ([place])   . Breast cancer  ([place]) 5/14   left breast  . Breast cancer ([place]) 02/2013   Dr Laurance Flatten ([place])  . Diabetes mellitus   . Diverticulosis   . Fibromyalgia   . GERD (gastroesophageal reflux disease)   . Heart murmur   . Hyperlipidemia   . Lymphedema of arm    right  . Myalgia   . Myositis   . Neuromyositis   . Otitis media, chronic   . Pain syndrome, chronic   . Peptic ulcer   . Sicca syndrome ([place])   . Sjogren's disease ([place])   . Sleep apnea   . SOB (shortness of breath)   . 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 BREAST SURGERY Bilateral    mastectomy  . COLONOSCOPY WITH PROPOFOL N/A 04/22/2017   Procedure: COLONOSCOPY WITH PROPOFOL;  Surgeon: Lollie Sails, MD;  Location: [facility] ENDOSCOPY;  Service: Endoscopy;  Laterality: N/A;  . ESOPHAGOGASTRODUODENOSCOPY (EGD) WITH PROPOFOL N/A 04/22/2017   Procedure: ESOPHAGOGASTRODUODENOSCOPY (EGD) WITH PROPOFOL;  Surgeon: Lollie Sails, MD;  Location: [facility] ENDOSCOPY;  Service: Endoscopy;  Laterality: N/A;  . MASTECTOMY Bilateral 03/23/13   Family Hx:  Family History  Problem Relation Age of Onset  . Diabetes Mother   . Heart disease Mother   . Hyperlipidemia Mother   . Heart failure Mother   . Diabetes Other   . Heart disease Brother        Aortic valve disease  . Hyperlipidemia Brother   .  Colon polyps Brother   . Stroke Maternal Grandmother   . Breast cancer Maternal Aunt   . Colon cancer Paternal Grandmother   . Rheum arthritis Sister   . Psoriasis Other        psoriatic arthritis   . Heart disease Brother 51       Tachycardia  . Heart disease Brother   . Ovarian cancer Neg Hx    Social Hx:   Social History   Tobacco Use  . Smoking status: Former Smoker    Packs/day: 1.00    Years: 30.00    Pack years: 30.00    Types: Cigarettes    Quit date: 02/28/2013    Years since quitting: 7.4  . Smokeless tobacco: Never Used  Vaping Use  . Vaping  Use: Never used  Substance Use Topics  . Alcohol use: No    Alcohol/week: 0.0 standard drinks  . Drug use: No   Medication:    Current Outpatient Medications:  .  albuterol (VENTOLIN HFA) 108 (90 Base) MCG/ACT inhaler, Inhale 2 puffs into the lungs every 4 (four) hours as needed for wheezing or shortness of breath., Disp: 18 g, Rfl: 0 .  ALPRAZolam (XANAX) 0.5 MG tablet, TAKE 1 TABLET (0.5 MG TOTAL) BY MOUTH 4 (FOUR) TIMES DAILY AS NEEDED FOR ANXIETY., Disp: 120 tablet, Rfl: 0 .  aspirin EC 81 MG tablet, Take 1 tablet (81 mg total) by mouth daily., Disp: 90 tablet, Rfl: 3 .  baclofen (LIORESAL) 10 MG tablet, Take 10 mg by mouth 2 (two) times daily., Disp: , Rfl:  .  Cholecalciferol (VITAMIN D3) 1000 units CAPS, Take by mouth daily. , Disp: , Rfl:  .  clobetasol ointment (TEMOVATE) 0.05 %, Apply topically. Vaginally, Disp: , Rfl:  .  Continuous Blood Gluc Sensor (FREESTYLE LIBRE 14 DAY SENSOR) MISC, SMARTSIG:1 Each Topical Every 2 Weeks, Disp: , Rfl:  .  Cyanocobalamin 1000 MCG CAPS, Take by mouth. Takes occassionally, Disp: , Rfl:  .  dicyclomine (BENTYL) 10 MG capsule, Take 1 capsule (10 mg total) by mouth 3 (three) times daily as needed for spasms., Disp: 60 capsule, Rfl: 1 .  Emollient (CERAVE) CREA, Apply topically daily. , Disp: , Rfl:  .  Evolocumab (REPATHA SURECLICK) 361 MG/ML SOAJ, Inject 140 mg into the skin every 14 (fourteen) days., Disp: 6 mL, Rfl: 1 .  FREESTYLE LITE test strip, CHECK 6 TIMES DAILY, Disp: 100 each, Rfl: 3 .  glucose blood (ONE TOUCH ULTRA TEST) test strip, CHECK SIX TIMES DAILY, Disp: 100 each, Rfl: 11 .  insulin aspart (NOVOLOG FLEXPEN) 100 UNIT/ML FlexPen, Take up to 2 - 4 units before meals up to 3 times daily, Disp: , Rfl:  .  Insulin Syringe-Needle U-100 (INSULIN SYRINGE .5CC/31GX5/16") 31G X 5/16" 0.5 ML MISC, Use twice daily with injections, Disp: , Rfl:  .  ketoconazole (NIZORAL) 2 % cream, Apply 1 application topically 2 (two) times daily., Disp: , Rfl:  3 .  ketoconazole (NIZORAL) 2 % shampoo, Apply 1 application topically once a week., Disp: , Rfl: 11 .  Lancets MISC, Use. BAYER CONTOUR LANCETS, Disp: , Rfl:  .  montelukast (SINGULAIR) 10 MG tablet, Take 1 tablet (10 mg total) by mouth at bedtime., Disp: 30 tablet, Rfl: 3 .  mupirocin ointment (BACTROBAN) 2 %, Apply two times a day for 7 days., Disp: 22 g, Rfl: 0 .  nystatin cream (MYCOSTATIN), Apply 1 application topically as needed., Disp: , Rfl:  .  ondansetron (ZOFRAN) 4 MG  tablet, TAKE 1 TABLET BY MOUTH EVERY 8 HOURS AS NEEDED FOR NAUSEA AND VOMITING, Disp: 18 tablet, Rfl: 1 .  ONETOUCH DELICA LANCETS 82U MISC, TWICE DAILY, Disp: 100 each, Rfl: 3 .  Polyethyl Glycol-Propyl Glycol (SYSTANE OP), Apply to eye., Disp: , Rfl:  .  pseudoephedrine-guaifenesin (MUCINEX D) 60-600 MG 12 hr tablet, Take 1 tablet by mouth 2 (two) times daily as needed. , Disp: , Rfl:  .  rosuvastatin (CRESTOR) 40 MG tablet, Take 1 tablet (40 mg total) by mouth daily., Disp: 90 tablet, Rfl: 3 .  Tiotropium Bromide Monohydrate (SPIRIVA RESPIMAT) 1.25 MCG/ACT AERS, Inhale 2 puffs into the lungs daily., Disp: 1 g, Rfl: 3 .  traMADol (ULTRAM) 50 MG tablet, TAKE 1 TABLET BY MOUTH EVERY 6 HOURS AS NEEDED. FOR PAIN, Disp: 120 tablet, Rfl: 1 .  traZODone (DESYREL) 50 MG tablet, Take 0.5-1 tablets (25-50 mg total) by mouth at bedtime as needed for sleep., Disp: 30 tablet, Rfl: 3 .  TRESIBA FLEXTOUCH 100 UNIT/ML SOPN FlexTouch Pen, 20 Units at bedtime., Disp: , Rfl: 5 .  triamcinolone (NASACORT ALLERGY 24HR) 55 MCG/ACT AERO nasal inhaler, Place 2 sprays into the nose daily., Disp: , Rfl:  .  famotidine (PEPCID) 20 MG tablet, Take 1 tablet (20 mg total) by mouth at bedtime for 14 days., Disp: 14 tablet, Rfl: 0 .  losartan (COZAAR) 25 MG tablet, Take 0.5 tablets (12.5 mg total) by mouth daily. (Patient not taking: Reported on 08/18/2020), Disp: 45 tablet, Rfl: 1   Allergies:  Gabapentin, Oxycodone, Carbamazepine, Doxycycline,  Dulaglutide, Insulin glargine, Levaquin [levofloxacin], Metformin, Methylprednisolone, Morphine, Pregabalin, Venlafaxine, Buprenorphine, Canagliflozin, Escitalopram, and Hydroxychloroquine    Review of Systems:  Gen:  Denies  fever, sweats, chills weight loss  HEENT: Denies blurred vision, double vision, ear pain, eye pain, hearing loss, nose bleeds, sore throat Cardiac:  No dizziness, chest pain or heaviness, chest tightness,edema, No JVD Resp:   No cough, -sputum production, -shortness of breath,-wheezing, -hemoptysis,  Gi: Denies swallowing difficulty, stomach pain, nausea or vomiting, diarrhea, constipation, bowel incontinence Gu:  Denies bladder incontinence, burning urine Ext:   Denies Joint pain, stiffness or swelling Skin: Denies  skin rash, easy bruising or bleeding or hives Endoc:  Denies polyuria, polydipsia , polyphagia or weight change Psych:   Denies depression, insomnia or hallucinations  Other:  All other systems negative    BP 122/68 (BP Location: Left Arm, Cuff Size: Normal)   Pulse 72   Temp (!) 97.1 F (36.2 C) (Temporal)   Ht 5\' 4"  (1.626 m)   Wt 151 lb 12.8 oz (68.9 kg)   SpO2 97%   BMI 26.06 kg/m     Physical Examination:   General Appearance: No distress  Neuro:without focal findings,  speech normal,  HEENT: PERRLA, EOM intact.   Pulmonary: normal breath sounds, No wheezing.  CardiovascularNormal S1,S2.  No m/r/g.   Abdomen: Benign, Soft, non-tender. Renal:  No costovertebral tenderness  GU:  Not performed at this time. Endoc: No evident thyromegaly Skin:   warm, no rashes, no ecchymosis  Extremities: normal, no cyanosis, clubbing. PSYCHIATRIC: Mood, affect within normal limits.   ALL OTHER ROS ARE NEGATIVE     Assessment and Plan:  Chronic shortness of breath and dyspnea exertion progressive over the last several months, Most likely related to restrictive lung disease and body habitus  Patient has a history of Sjogren's syndrome as well  as history of radiation fibrosis -patient being followed at [facility] At this point albuterol seems to  help  aCute exacerbation at this time likely related to acute bronchitis may be a bacterial infection Start prednisone 10 mg daily for 7 days Start Z-Pak    OSA Patient uses and benefits from therapy Her AHI is down to 0.3 Patient is compliant 100% for 30 days She is 77% compliance for greater than 4 hours  Sjogren's syndrome follow-up with [facility] rheumatology  Cardiac disease and CHF Follow-up with cardiology as scheduled    COVID-19 EDUCATION: The signs and symptoms of COVID-19 were discussed with the patient and how to seek care for testing.  The importance of social distancing was discussed today. Hand Washing Techniques and avoid touching face was advised.     MEDICATION ADJUSTMENTS/LABS AND TESTS ORDERED: Continue albuterol as prescribed Avoid secondhand smoke exposure Start prednisone 10 mg daily for 7 days Start Z-Pak Lung cancer screening referral with Melinda Crutch Continue CPAP as prescribed    CURRENT MEDICATIONS REVIEWED AT LENGTH WITH PATIENT TODAY   Patient satisfied with Plan of action and management. All questions answered  Follow up in 6 months  Total Time spent 31 minutes  Corrin Parker, M.D.  [facility]  Medical Director [place] Director [facility] Cardio-Pulmonary Department

## 2020-08-21 ENCOUNTER — Other Ambulatory Visit: Payer: Self-pay | Admitting: *Deleted

## 2020-08-21 DIAGNOSIS — Z122 Encounter for screening for malignant neoplasm of respiratory organs: Secondary | ICD-10-CM

## 2020-08-21 DIAGNOSIS — Z87891 Personal history of nicotine dependence: Secondary | ICD-10-CM

## 2020-08-21 NOTE — Progress Notes (Signed)
Contacted and scheduled for lung screening scan. Patient is a former smoker, quit 2014, 30 pack year history.

## 2020-08-22 ENCOUNTER — Other Ambulatory Visit: Payer: Self-pay | Admitting: Family Medicine

## 2020-08-25 ENCOUNTER — Other Ambulatory Visit: Payer: Self-pay | Admitting: Family Medicine

## 2020-08-25 DIAGNOSIS — G4700 Insomnia, unspecified: Secondary | ICD-10-CM

## 2020-08-28 ENCOUNTER — Ambulatory Visit
Admission: RE | Admit: 2020-08-28 | Discharge: 2020-08-28 | Disposition: A | Discharge status: Home / Self Care | Payer: PPO | Source: Ambulatory Visit | Attending: Nurse Practitioner | Admitting: Nurse Practitioner

## 2020-08-28 ENCOUNTER — Other Ambulatory Visit: Payer: Self-pay

## 2020-08-28 DIAGNOSIS — Z87891 Personal history of nicotine dependence: Secondary | ICD-10-CM | POA: Diagnosis not present

## 2020-08-28 DIAGNOSIS — Z122 Encounter for screening for malignant neoplasm of respiratory organs: Secondary | ICD-10-CM | POA: Diagnosis not present

## 2020-08-30 ENCOUNTER — Encounter: Payer: Self-pay | Admitting: *Deleted

## 2020-08-30 DIAGNOSIS — R748 Abnormal levels of other serum enzymes: Secondary | ICD-10-CM | POA: Diagnosis not present

## 2020-08-30 DIAGNOSIS — K582 Mixed irritable bowel syndrome: Secondary | ICD-10-CM | POA: Diagnosis not present

## 2020-08-30 DIAGNOSIS — R110 Nausea: Secondary | ICD-10-CM | POA: Diagnosis not present

## 2020-09-01 ENCOUNTER — Ambulatory Visit: Payer: PPO | Admitting: Family

## 2020-09-05 ENCOUNTER — Telehealth: Payer: Self-pay

## 2020-09-05 NOTE — Chronic Care Management (AMB) (Signed)
  Chronic Care Management   Note  09/05/2020 Name: Muskan Bolla MRN: [id number] DOB: Mar 28, 1956  Bernadene Garside is a 64 y.o. year old female who is a primary care patient of Caryl Bis, Angela Adam, MD. I reached out to [place] by phone today in response to a referral sent by Ms. Morene Antu Mckellar's PCP, Dr. Caryl Bis      Ms. Adell was given information about Chronic Care Management services today including:  1. CCM service includes personalized support from designated clinical staff supervised by her physician, including individualized plan of care and coordination with other care providers 2. 24/7 contact phone numbers for assistance for urgent and routine care needs. 3. Service will only be billed when office clinical staff spend 20 minutes or more in a month to coordinate care. 4. Only one practitioner may furnish and bill the service in a calendar month. 5. The patient may stop CCM services at any time (effective at the end of the month) by phone call to the office staff. 6. The patient will be responsible for cost sharing (co-pay) of up to 20% of the service fee (after annual deductible is met).  Patient agreed to services and verbal consent obtained.   Follow up plan: Telephone appointment with care management team member scheduled for:09/18/2020  Noreene Larsson, [place], [place], [place] [address] Direct Dial: [phone] [email or web address]: [email or web address]

## 2020-09-07 ENCOUNTER — Other Ambulatory Visit (HOSPITAL_COMMUNITY): Payer: Self-pay | Admitting: Physician Assistant

## 2020-09-07 ENCOUNTER — Encounter: Payer: Self-pay | Admitting: Family Medicine

## 2020-09-07 DIAGNOSIS — I428 Other cardiomyopathies: Secondary | ICD-10-CM

## 2020-09-07 DIAGNOSIS — R0789 Other chest pain: Secondary | ICD-10-CM

## 2020-09-07 DIAGNOSIS — E1165 Type 2 diabetes mellitus with hyperglycemia: Secondary | ICD-10-CM

## 2020-09-07 DIAGNOSIS — R0600 Dyspnea, unspecified: Secondary | ICD-10-CM

## 2020-09-07 DIAGNOSIS — R0609 Other forms of dyspnea: Secondary | ICD-10-CM

## 2020-09-18 ENCOUNTER — Ambulatory Visit: Payer: PPO | Admitting: Pharmacist

## 2020-09-18 DIAGNOSIS — E1165 Type 2 diabetes mellitus with hyperglycemia: Secondary | ICD-10-CM

## 2020-09-18 DIAGNOSIS — K743 Primary biliary cirrhosis: Secondary | ICD-10-CM

## 2020-09-18 DIAGNOSIS — E785 Hyperlipidemia, unspecified: Secondary | ICD-10-CM

## 2020-09-18 DIAGNOSIS — I428 Other cardiomyopathies: Secondary | ICD-10-CM

## 2020-09-18 DIAGNOSIS — R7989 Other specified abnormal findings of blood chemistry: Secondary | ICD-10-CM

## 2020-09-18 NOTE — Patient Instructions (Signed)
Ms. Gallaher,   It was great talking to you today!  We are going to apply for manufacturer assistance for [place] and Novolog through [employer]. Please fill out the highlighted portions of the application and leave me a copy of your proof of income.   Once all parts are received, we will fax this to [employer] on your behalf. My pharmacy technician, Sharee Pimple, will be [employer] and calling you in January for follow up.   You are over income for Spiriva at this time, and Repatha's program does not allow patients that have prescription insurance. Hopefully, getting the insulins for free will remove enough drug cost that you won't hit the "[facility]" as early in 2022. Call me if the Repatha is not $45 in January.   Please call me with any questions or concerns!  Catie Darnelle Maffucci, PharmD [phone]   Visit Information  Patient Care Plan: Medication Management    Problem Identified: Hyperlipidemia, Diabetes, COPD     Long-Range Goal: Disease Progression Prevention   This Visit's Progress: On track  Priority: High  Note:   Current Barriers:  . Unable to independently afford treatment regimen . Unable to achieve control of cholesterol   Pharmacist Clinical Goal(s):  Marland Kitchen Over the next 90 days, patient will verbalize ability to afford treatment regimen . Over the next 90 days, patient will achieve control of cholesterol through collaboration with PharmD and provider.  .   Interventions: . 1:1 collaboration with [name], MD regarding development and update of comprehensive plan of care as evidenced by provider attestation and co-signature . Inter-disciplinary care team collaboration (see longitudinal plan of care) . Comprehensive medication review performed; medication list updated in electronic medical record  Diabetes: . Uncontrolled; current treatment: Tresiba 6 units daily, Novolog 2-4 units TID per sliding scale; follows w/ Malissa Hippo at [facility]   o Hx intolerance to: metformin (GI upset), Invokana (yeast infections), Jardiance (pancreatitis), Trulicity (abdominal pain), glipizide XR (hypoglycemia) . Does indicate cost concerns with insulin now that she is in the coverage gap.  Marland Kitchen Discussed income. She would qualify for assistance from [employer] for [place] and [employer]. Discuss program and application process. Patient notes it would be easier to pick up Novo supply from our office than from [facility], so will collaborate w/ Dr. Biagio Quint on provider portion. Patient will come by tomorrow to sign applications and bring proof of income . Continue current regimen at this time  Hyperlipidemia and ASCVD risk reduction: . Uncontrolled; current treatment: prescribed Repatha, but she notes that she was unable to afford because she is in the Medicare Coverage Gap . Antiplatelet regimen: aspirin 81 mg daily - patient notes she stopped taking d/t recent elevation in liver enzymes . Medications previously tried: rosuvastatin - liver enzyme elevations with therapy.  . Discussed association between cholesterol and aortic atherosclerosis/calcifications seen on her CT lung and possible association with cardiovascular disease. Discussed benefit of LDL reduction. Discussed mechanism of PCSK9i vs statin and improved tolerability. Patient amenable to start [place] in 2022 when the copay will go back to $45/month. Will alleviate costs of other medications to push off time to hitting Coverage Gap in 2022.  Marland Kitchen Discussed that previously elevated liver enzymes are not a reason to avoid low dose aspirin therapy, and given atherosclerosis found on imaging, appropriate to continue low dose aspirin unless told to discontinue by cardiology. Patient verbalizes understanding.   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 Uncontrolled; current treatment: prescribed Spiriva,  but notes cost was too much (was likely in Coverage Gap). Albuterol HFA PRN, using very  infrequently. Montelukast 10 mg daily, triamcinolone nasal daily PRN. Follows w/ Dr. Mortimer Fries  . 1 exacerbation in the last 6 months . Discussed principals of maintenance vs rescue inhalers. Discussed that Spiriva cost should go back to $45 in January (confirmed that Spiriva is preferred on [employer] formulary). Unfortunately, patient is over income for Spiriva assistance through [facility] and will not have met the $600 out of pocket spend criteria for Incruse until later in 2022. Encouraged to discuss w/ Dr. Mortimer Fries to see if she could try a sample from his office prior to paying for therapy.  . If restrictive aspect of lung disease, patient may benefit from ICS therapy. Patient would qualify for assistance for Symbicort through [employer]. Will discuss w/ pulmonary Dr. Mortimer Fries   Anxiety: . Uncontrolled; current regimen: alprazolam 0.5 mg up to QID PRN anxiety, notes she is taking TID o Previously tried: escitalopram (noted rash) . Will further discuss treatment and safety moving forward.   Pain: . Patient notes controlled; current regimen: tramadol 50 mg Q6H PRN, not needing every day. Baclofen 10 mg BID PRN.  Marland Kitchen Continue current regimen at this time  Gastrointestinal Concerns: . Current regimen: dicyclomine 10 mg TID PRN, famotidine 20 mg daily PRN . Continue current regimen at this time  Patient Goals/Self-Care Activities . Over the next 90 days, patient will:  - take medications as prescribed check glucose TID using CGM, document, and provide at future appointments collaborate with provider on medication access solutions  Follow Up Plan: Telephone follow up appointment with care management team member scheduled for: ~ 6 weeks      Ms. Karge was given information about Chronic Care Management services today including:  1. CCM service includes personalized support from designated clinical staff supervised by her physician, including individualized plan of care and coordination with other  care providers 2. 24/7 contact phone numbers for assistance for urgent and routine care needs. 3. Service will only be billed when office clinical staff spend 20 minutes or more in a month to coordinate care. 4. Only one practitioner may furnish and bill the service in a calendar month. 5. The patient may stop CCM services at any time (effective at the end of the month) by phone call to the office staff. 6. The patient will be responsible for cost sharing (co-pay) of up to 20% of the service fee (after annual deductible is met).  Patient agreed to services and verbal consent obtained.   The patient verbalized understanding of instructions, educational materials, and care plan provided today and agreed to receive a mailed copy of patient instructions, educational materials, and care plan.   Plan: Telephone follow up appointment with care management team member scheduled for: ~ 6 weeks  Catie Darnelle Maffucci, PharmD, [place], [place] Clinical Pharmacist [employer] at [employer] [phone]

## 2020-09-18 NOTE — Chronic Care Management (AMB) (Signed)
Chronic Care Management   Pharmacy Note  09/18/2020 Name: Kristina Mccoy MRN: [id number] DOB: [birth year]  Subjective:  Kristina Mccoy is a 64 y.o. year old female who is a primary care patient of Caryl Bis, Angela Adam, MD. The CCM team was consulted for assistance with chronic disease management and care coordination needs.    Engaged with patient by telephone for initial visit in response to provider referral for pharmacy case management and/or care coordination services.   Consent to Services:  Ms. Ehler was given the following information about Chronic Care Management services today, agreed to services, and gave verbal consent: 1. CCM service includes personalized support from designated clinical staff supervised by her physician, including individualized plan of care and coordination with other care providers 2. 24/7 contact phone numbers for assistance for urgent and routine care needs. 3. Service will only be billed when office clinical staff spend 20 minutes or more in a month to coordinate care. 4. Only one practitioner may furnish and bill the service in a calendar month. 5.The patient may stop CCM services at any time (effective at the end of the month) by phone call to the office staff. 6. The patient will be responsible for cost sharing (co-pay) of up to 20% of the service fee (after annual deductible is met). Patient agreed to services and consent obtained.  Objective:  Lab Results  Component Value Date   CREATININE 0.66 07/03/2020   CREATININE 0.79 05/08/2020   CREATININE 0.86 05/04/2020    Lab Results  Component Value Date   HGBA1C 8.0 (H) 02/15/2020       Component Value Date/Time   CHOL 289 (H) 04/28/2020 1446   CHOL 113 09/03/2014 0352   TRIG 177 (H) 04/28/2020 1446   TRIG 107 09/03/2014 0352   HDL 54 04/28/2020 1446   HDL 16 (L) 09/03/2014 0352   CHOLHDL 5.4 (H) 04/28/2020 1446   VLDL 23.6 04/27/2018 1419   VLDL 21 09/03/2014 0352   LDLCALC 200 (H)  04/28/2020 1446   LDLCALC 76 09/03/2014 0352   LDLDIRECT 153.0 06/19/2020 0941    Clinical ASCVD: No  The 10-year ASCVD risk score Mikey Bussing DC Jr., et al., 2013) is: 22.2%   Values used to calculate the score:     Age: 69 years     Sex: Female     Is Non-Hispanic African American: No     Diabetic: Yes     Tobacco smoker: Yes     Systolic Blood Pressure: 213 mmHg     Is BP treated: No     HDL Cholesterol: 54 mg/dL     Total Cholesterol: 289 mg/dL    Other: (CHADS2VASc if Afib, PHQ9 if depression, MMRC or CAT for COPD, ACT)  BP Readings from Last 3 Encounters:  08/18/20 122/68  07/31/20 118/70  07/27/20 112/64    Assessment/Interventions: Review of patient past medical history, allergies, medications, health status, including review of consultants reports, laboratory and other test data, was performed as part of comprehensive evaluation and provision of chronic care management services.   SDOH (Social Determinants of Health) assessments and interventions performed:  SDOH Interventions   Flowsheet Row Most Recent Value  SDOH Interventions   Financial Strain Interventions Other (Comment)  [manufacturer assistance]       CCM Care Plan  Allergies  Allergen Reactions  . Gabapentin Itching and Swelling  . Oxycodone Itching and Rash  . Carbamazepine Nausea And Vomiting and Other (See Comments)    Abdominal pain   .  Doxycycline Other (See Comments)  . Dulaglutide     Other reaction(s): Other (See Comments) Severe stomach pain  . Insulin Glargine Other (See Comments)    BLOATING, MUSCLE/JOINT PAIN  . Levaquin [Levofloxacin]     Diarrhea and pain in calf  . Metformin     Other reaction(s): Other (See Comments) Cramping- abdominal pain  . Methylprednisolone     Reddened face, puffy face   . Morphine Other (See Comments)  . Pregabalin Swelling    Swelling in arms and legs  . Venlafaxine Other (See Comments)  . Buprenorphine Rash and Swelling  . Canagliflozin Other (See  Comments) and Rash    YEAST INFECTIONS  . Escitalopram Rash  . Hydroxychloroquine Rash    Medications Reviewed Today    Reviewed by De Hollingshead, RPH-CPP (Pharmacist) on 09/18/20 at 1351  Med List Status: <None>  Medication Order Taking? Sig Documenting Provider Last Dose Status Informant  albuterol (VENTOLIN HFA) 108 (90 Base) MCG/ACT inhaler [id number] Yes Inhale 2 puffs into the lungs every 4 (four) hours as needed for wheezing or shortness of breath. Sharion Balloon, NP Taking Active   ALPRAZolam Duanne Moron) 0.5 MG tablet [id number] Yes TAKE 1 TABLET (0.5 MG TOTAL) BY MOUTH 4 (FOUR) TIMES DAILY AS NEEDED FOR ANXIETY. [name], MD Taking Active   aspirin EC 81 MG tablet [id number] No Take 1 tablet (81 mg total) by mouth daily.  Patient not taking: Reported on 09/18/2020   End, Harrell Gave, MD Not Taking Active   baclofen (LIORESAL) 10 MG tablet [id number] Yes Take 10 mg by mouth 2 (two) times daily. [provider] Taking Active   Biotin 10 MG TABS [id number] Yes Take by mouth. [provider] Taking Active   Cholecalciferol (VITAMIN D3) 1000 units CAPS [id number] Yes Take by mouth daily.  [provider] Taking Active            Med Note ([facility], BRANDY L   Wed Jan 12, 2020  1:21 PM)    clobetasol ointment (TEMOVATE) 0.05 % [id number]  Apply topically. Vaginally [provider]  Active            Med Note Mayo Ao Sep 18, 2020  1:46 PM)    Continuous Blood Gluc Sensor (FREESTYLE LIBRE 14 [place]) [place] [id number] Yes SMARTSIG:1 Each Topical Every 2 Weeks [provider] Taking Active   Cyanocobalamin [place] [id number] Yes Take by mouth. Takes occassionally [provider] Taking Active   dicyclomine (BENTYL) 10 MG capsule [id number] Yes Take 1 capsule (10 mg total) by mouth 3 (three) times daily as needed for spasms. Jackolyn Confer, MD Taking Active   Emollient [facility]) CREA [id number] Yes  Apply topically daily.  [provider] Taking Active   Evolocumab (REPATHA SURECLICK) 268 MG/ML SOAJ [id number] No Inject 140 mg into the skin every 14 (fourteen) days.  Patient not taking: Reported on 09/18/2020   [name], MD Not Taking Active   famotidine (PEPCID) 20 MG tablet [id number] Yes Take 1 tablet (20 mg total) by mouth at bedtime for 14 days.  Patient taking differently: Take 20 mg by mouth daily. LRN   Jodelle Green, FNP Taking Expired 06/14/20 2359   ibuprofen (ADVIL) 200 MG tablet [id number] Yes Take 200 mg by mouth every 6 (six) hours as needed. [provider] Taking Active   insulin aspart (NOVOLOG) 100 UNIT/ML FlexPen [id number] Yes Sliding scale up to three times daily  with meals [provider] Taking Active   ketoconazole (NIZORAL) 2 % cream [id number] Yes Apply 1 application topically 2 (two) times daily. [provider] Taking Active   ketoconazole (NIZORAL) 2 % shampoo [id number] Yes Apply 1 application topically once a week. [provider] Taking Active   losartan (COZAAR) 25 MG tablet [id number] No Take 0.5 tablets (12.5 mg total) by mouth daily.  Patient not taking: No sig reported   End, Christopher, MD Not Taking Active   montelukast (SINGULAIR) 10 MG tablet [id number] Yes Take 1 tablet (10 mg total) by mouth at bedtime. [name], MD Taking Active            Med Note Mayo Ao Sep 18, 2020  1:49 PM) Using PRN  ondansetron (ZOFRAN) 4 MG tablet [id number] No TAKE 1 TABLET BY MOUTH EVERY 8 HOURS AS NEEDED FOR NAUSEA AND VOMITING  Patient not taking: Reported on 09/18/2020   [name], MD Not Taking Active   Polyethyl Glycol-Propyl Glycol [facility] OP) [id number] Yes Apply to eye. [provider] Taking Active   pseudoephedrine-guaifenesin (MUCINEX D) 60-600 MG 12 hr tablet [id number] Yes Take 1 tablet by mouth 2 (two) times daily as needed.  [provider] Taking Active    Tiotropium Bromide Monohydrate (SPIRIVA RESPIMAT) 1.25 MCG/ACT AERS [id number] No Inhale 2 puffs into the lungs daily.  Patient not taking: Reported on 09/18/2020   Flora Lipps, MD Not Taking Active   traMADol (ULTRAM) 50 MG tablet [id number] Yes TAKE 1 TABLET BY MOUTH EVERY 6 HOURS AS NEEDED. FOR PAIN [name], MD Taking Active   traZODone (DESYREL) 50 MG tablet [id number] Yes TAKE 0.5-1 TABLETS BY MOUTH AT BEDTIME AS NEEDED FOR SLEEP. [name], MD Taking Active   TRESIBA FLEXTOUCH 100 UNIT/ML SOPN FlexTouch Pen [id number] Yes 20 Units at bedtime. [provider] Taking Active            Med Note Mayo Ao Sep 18, 2020  1:47 PM) 6 units QPM  triamcinolone (NASACORT) 55 MCG/ACT AERO nasal inhaler [id number] Yes Place 2 sprays into the nose daily. [provider] Taking Active           Patient Active Problem List   Diagnosis Date Noted  . Elevated LFTs 08/04/2020  . Chronic heart failure with preserved ejection fraction (HFpEF) ([place]) 06/15/2020  . Elevated aldolase level 04/28/2020  . Tick bite of back 02/14/2020  . Non-ischemic cardiomyopathy ([place]) 01/13/2020  . History of chest pain 10/20/2019  . Former smoker 08/30/2019  . Restrictive lung disease 08/30/2019  . Foot pain 02/16/2019  . Hair loss 08/31/2018  . Bilateral leg cramps 05/27/2018  . Memory difficulty 04/27/2018  . Iliotibial band syndrome 04/27/2018  . Respiratory illness 04/27/2018  . Allergic rhinitis 01/26/2018  . RUQ pain 10/28/2017  . Obesity (BMI 30.0-34.9) 10/28/2017  . Pain in limb 06/03/2017  . Chest pain 01/17/2017  . Throat fullness 10/15/2016  . 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 Rash 08/08/2016  . Lower abdominal pain 08/08/2016  . Cephalalgia 01/26/2016  . Dysuria 12/20/2015  . Vitamin D deficiency 12/20/2015  . Sjogren's syndrome ([place]) 12/20/2015  . Chronic fatigue 12/04/2015  . Insomnia 10/19/2015  . Chronic bilateral low back  pain without sciatica 11/07/2014  . OSA on CPAP 10/19/2014  . Anxiety and depression 08/16/2014  . Allergic urticaria 06/02/2014  . Dyspnea 05/04/2014  . Diabetes type 2, uncontrolled ([place]) 07/06/2013  .  Malignant neoplasm of breast (female) ([place]) 02/28/2013  . Blood clotting disorder ([place]) 12/01/2012  . Palpitations 12/01/2012  . Hyperlipidemia 12/01/2012  . Chronic pain disorder 06/11/2012  . Hearing loss 09/16/2011  . Fibromyalgia 06/15/2011    Conditions to be addressed/monitored per PCP order: HTN, HLD and DMII  Patient Care Plan: Medication Management    Problem Identified: Hyperlipidemia, Diabetes, COPD     Long-Range Goal: Disease Progression Prevention   This Visit's Progress: On track  Priority: High  Note:   Current Barriers:  . Unable to independently afford treatment regimen . Unable to achieve control of cholesterol   Pharmacist Clinical Goal(s):  Marland Kitchen Over the next 90 days, patient will verbalize ability to afford treatment regimen . Over the next 90 days, patient will achieve control of cholesterol through collaboration with PharmD and provider.  .   Interventions: . 1:1 collaboration with [name], MD regarding development and update of comprehensive plan of care as evidenced by provider attestation and co-signature . Inter-disciplinary care team collaboration (see longitudinal plan of care) . Comprehensive medication review performed; medication list updated in electronic medical record  Diabetes: . Uncontrolled; current treatment: Tresiba 6 units daily, Novolog 2-4 units TID per sliding scale; follows w/ Malissa Hippo at [facility]  o Hx intolerance to: metformin (GI upset), Invokana (yeast infections), Jardiance (pancreatitis), Trulicity (abdominal pain), glipizide XR (hypoglycemia) . Does indicate cost concerns with insulin now that she is in the coverage gap.  Marland Kitchen Discussed income. She would qualify for assistance from [employer] for  [place] and [employer]. Discuss program and application process. Patient notes it would be easier to pick up Novo supply from our office than from [facility], so will collaborate w/ Dr. Biagio Quint on provider portion. Patient will come by tomorrow to sign applications and bring proof of income . Continue current regimen at this time  Hyperlipidemia and ASCVD risk reduction: . Uncontrolled; current treatment: prescribed Repatha, but she notes that she was unable to afford because she is in the Medicare Coverage Gap . Antiplatelet regimen: aspirin 81 mg daily - patient notes she stopped taking d/t recent elevation in liver enzymes . Medications previously tried: rosuvastatin - liver enzyme elevations with therapy.  . Discussed association between cholesterol and aortic atherosclerosis/calcifications seen on her CT lung and possible association with cardiovascular disease. Discussed benefit of LDL reduction. Discussed mechanism of PCSK9i vs statin and improved tolerability. Patient amenable to start [place] in 2022 when the copay will go back to $45/month. Will alleviate costs of other medications to push off time to hitting Coverage Gap in 2022.  Marland Kitchen Discussed that previously elevated liver enzymes are not a reason to avoid low dose aspirin therapy, and given atherosclerosis found on imaging, appropriate to continue low dose aspirin unless told to discontinue by cardiology. Patient verbalizes understanding.   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 Uncontrolled; current treatment: prescribed Spiriva, but notes cost was too much (was likely in Coverage Gap). Albuterol HFA PRN, using very infrequently. Montelukast 10 mg daily, triamcinolone nasal daily PRN. Follows w/ Dr. Mortimer Fries  . 1 exacerbation in the last 6 months . Discussed principals of maintenance vs rescue inhalers. Discussed that Spiriva cost should go back to $45 in January (confirmed that Spiriva is preferred on [employer] formulary).  Unfortunately, patient is over income for Spiriva assistance through [facility] and will not have met the $600 out of pocket spend criteria for Incruse until later in 2022. Encouraged to discuss w/ Dr. Mortimer Fries to  see if she could try a sample from his office prior to paying for therapy.  . If restrictive aspect of lung disease, patient may benefit from ICS therapy. Patient would qualify for assistance for Symbicort through [employer]. Will discuss w/ pulmonary Dr. Mortimer Fries   Anxiety: . Uncontrolled; current regimen: alprazolam 0.5 mg up to QID PRN anxiety, notes she is taking TID o Previously tried: escitalopram (noted rash) . Will further discuss treatment and safety moving forward.   Pain: . Patient notes controlled; current regimen: tramadol 50 mg Q6H PRN, not needing every day. Baclofen 10 mg BID PRN.  Marland Kitchen Continue current regimen at this time  Gastrointestinal Concerns: . Current regimen: dicyclomine 10 mg TID PRN, famotidine 20 mg daily PRN . Continue current regimen at this time  Patient Goals/Self-Care Activities . Over the next 90 days, patient will:  - take medications as prescribed check glucose TID using CGM, document, and provide at future appointments collaborate with provider on medication access solutions  Follow Up Plan: Telephone follow up appointment with care management team member scheduled for: ~ 6 weeks      Medication Assistance: Application for Novo medication assistance program in process. Anticipated assistance start date TBD. See plan of care above for additional detail.   Plan: Telephone follow up appointment with care management team member scheduled for: ~ 6 weeks  Catie Darnelle Maffucci, PharmD, [place], [place] Clinical Pharmacist [employer] at [employer] [phone]

## 2020-09-19 ENCOUNTER — Other Ambulatory Visit: Payer: Self-pay | Admitting: Family Medicine

## 2020-10-03 ENCOUNTER — Ambulatory Visit: Payer: PPO | Admitting: Pharmacist

## 2020-10-03 DIAGNOSIS — E1165 Type 2 diabetes mellitus with hyperglycemia: Secondary | ICD-10-CM | POA: Diagnosis not present

## 2020-10-03 DIAGNOSIS — K743 Primary biliary cirrhosis: Secondary | ICD-10-CM

## 2020-10-03 DIAGNOSIS — E785 Hyperlipidemia, unspecified: Secondary | ICD-10-CM

## 2020-10-03 DIAGNOSIS — Z794 Long term (current) use of insulin: Secondary | ICD-10-CM | POA: Diagnosis not present

## 2020-10-03 NOTE — Patient Instructions (Signed)
Visit Information  Patient Care Plan: Medication Management    Problem Identified: Hyperlipidemia, Diabetes, COPD     Long-Range Goal: Disease Progression Prevention   Recent Progress: On track  Priority: High  Note:   Current Barriers:  . Unable to independently afford treatment regimen . Unable to achieve control of cholesterol   Pharmacist Clinical Goal(s):  Marland Kitchen Over the next 90 days, patient will verbalize ability to afford treatment regimen . Over the next 90 days, patient will achieve control of cholesterol through collaboration with PharmD and provider.  .   Interventions: . 1:1 collaboration with Glori Luis, MD regarding development and update of comprehensive plan of care as evidenced by provider attestation and co-signature . Inter-disciplinary care team collaboration (see longitudinal plan of care) . Comprehensive medication review performed; medication list updated in electronic medical record  Diabetes: . Uncontrolled; current treatment: Tresiba 6 units daily, Novolog 2-4 units TID per sliding scale; follows w/ Starla Link at [facility]  o Hx intolerance to: metformin (GI upset), Invokana (yeast infections), Jardiance (pancreatitis), Trulicity (abdominal pain), glipizide XR (hypoglycemia) . Patient has not had the opportunity to come sign off on [employer] paperwork for assistance. She will do so within the week. Will collaborate w/ CPhT to submit and follow up.  Hyperlipidemia and ASCVD risk reduction: . Uncontrolled; current treatment: prescribed Repatha, but she notes that she was unable to afford because she is in the Medicare Coverage Gap . Antiplatelet regimen: aspirin 81 mg daily - patient notes she stopped taking d/t recent elevation in liver enzymes . Medications previously tried: rosuvastatin - liver enzyme elevations with therapy.  . Patient called, noted that she attempted to fill Repatha but cost was $100. Investigated insurance coverage.  Her plan covers Repatha at Tier 4, there is not a Tier 3 preferred alternative (besides Welchol, which is not a clinical substitution). Completed Tiering Exception request for Repatha. Will follow.   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 Uncontrolled; current treatment: prescribed Spiriva, but notes cost was too much (was likely in Coverage Gap). Albuterol HFA PRN, using very infrequently. Montelukast 10 mg daily, triamcinolone nasal daily PRN. Follows w/ Dr. Belia Heman  . 1 exacerbation in the last 6  . Will discuss Spiriva vs Symbicort with patient and respiratory team moving forward. Patient over income for BI assistance, but would qualify for [place] assistance.   Anxiety: . Uncontrolled; current regimen: alprazolam 0.5 mg up to QID PRN anxiety, notes she is taking TID o Previously tried: escitalopram (noted rash) . Will further discuss treatment and safety moving forward.   Pain: . Patient notes controlled; current regimen: tramadol 50 mg Q6H PRN, not needing every day. Baclofen 10 mg BID PRN.  Marland Kitchen Recommend to continue current regimen at this time  Gastrointestinal Concerns: . Current regimen: dicyclomine 10 mg TID PRN, famotidine 20 mg daily PRN . Recommend to current regimen at this time  Patient Goals/Self-Care Activities . Over the next 90 days, patient will:  - take medications as prescribed check glucose TID using CGM, document, and provide at future appointments collaborate with provider on medication access solutions  Follow Up Plan: Telephone follow up appointment with care management team member scheduled for: ~ 3 weeks as previously scheduled       The patient verbalized understanding of instructions, educational materials, and care plan provided today and declined offer to receive copy of patient instructions, educational materials, and care plan.    Plan: Telephone follow up appointment with care management team  member scheduled for: ~ 3 weeks as previously  scheduled  Catie Darnelle Maffucci, PharmD, [place], [place] Clinical Pharmacist [employer] [facility] [employer] [phone]

## 2020-10-03 NOTE — Chronic Care Management (AMB) (Signed)
Chronic Care Management   Pharmacy Note  10/03/2020 Name: Kristina Mccoy MRN: [id number] DOB: 16-Mar-1956  Subjective:  Kristina Mccoy is a 65 y.o. year old female who is a primary care patient of Caryl Bis, Angela Adam, MD. The CCM team was consulted for assistance with chronic disease management and care coordination needs.    Engaged with patient by telephone for response to her call regarding medication access needs in response to provider referral for pharmacy case management and/or care coordination services.   Consent to Services:  Patient was given information about Chronic Care Management services, agreed to services, and gave verbal consent prior to initiation of services on 09/18/20. Please see initial visit note for detailed documentation.   Objective:  Lab Results  Component Value Date   CREATININE 0.66 07/03/2020   CREATININE 0.79 05/08/2020   CREATININE 0.86 05/04/2020    Lab Results  Component Value Date   HGBA1C 8.0 (H) 02/15/2020       Component Value Date/Time   CHOL 289 (H) 04/28/2020 1446   CHOL 113 09/03/2014 0352   TRIG 177 (H) 04/28/2020 1446   TRIG 107 09/03/2014 0352   HDL 54 04/28/2020 1446   HDL 16 (L) 09/03/2014 0352   CHOLHDL 5.4 (H) 04/28/2020 1446   VLDL 23.6 04/27/2018 1419   VLDL 21 09/03/2014 0352   LDLCALC 200 (H) 04/28/2020 1446   LDLCALC 76 09/03/2014 0352   LDLDIRECT 153.0 06/19/2020 0941   Clinical ASCVD: No  The 10-year ASCVD risk score Mikey Bussing DC Jr., et al., 2013) is: 22.2%   Values used to calculate the score:     Age: 71 years     Sex: Female     Is Non-Hispanic African American: No     Diabetic: Yes     Tobacco smoker: Yes     Systolic Blood Pressure: [id number] mmHg     Is BP treated: No     HDL Cholesterol: 54 mg/dL     Total Cholesterol: 289 mg/dL     BP Readings from Last 3 Encounters:  08/18/20 122/68  07/31/20 118/70  07/27/20 112/64    Assessment/Interventions: Review of patient past medical history, allergies,  medications, health status, including review of consultants reports, laboratory and other test data, was performed as part of comprehensive evaluation and provision of chronic care management services.   SDOH (Social Determinants of Health) assessments and interventions performed:    CCM Care Plan  Allergies  Allergen Reactions  . Gabapentin Itching and Swelling  . Oxycodone Itching and Rash  . Carbamazepine Nausea And Vomiting and Other (See Comments)    Abdominal pain   . Doxycycline Other (See Comments)  . Dulaglutide     Other reaction(s): Other (See Comments) Severe stomach pain  . Insulin Glargine Other (See Comments)    BLOATING, MUSCLE/JOINT PAIN  . Levaquin [Levofloxacin]     Diarrhea and pain in calf  . Metformin     Other reaction(s): Other (See Comments) Cramping- abdominal pain  . Methylprednisolone     Reddened face, puffy face   . Morphine Other (See Comments)  . Pregabalin Swelling    Swelling in arms and legs  . Venlafaxine Other (See Comments)  . Buprenorphine Rash and Swelling  . Canagliflozin Other (See Comments) and Rash    YEAST INFECTIONS  . Escitalopram Rash  . Hydroxychloroquine Rash    Medications Reviewed Today    Reviewed by De Hollingshead, RPH-CPP (Pharmacist) on 09/18/20 at 1351  Med List  Status: <None>  Medication Order Taking? Sig Documenting Provider Last Dose Status Informant  albuterol (VENTOLIN HFA) 108 (90 Base) MCG/ACT inhaler [id number] Yes Inhale 2 puffs into the lungs every 4 (four) hours as needed for wheezing or shortness of breath. Mickie Bail, NP Taking Active   ALPRAZolam Prudy Feeler) 0.5 MG tablet [id number] Yes TAKE 1 TABLET (0.5 MG TOTAL) BY MOUTH 4 (FOUR) TIMES DAILY AS NEEDED FOR ANXIETY. Glori Luis, MD Taking Active   aspirin EC 81 MG tablet [id number] No Take 1 tablet (81 mg total) by mouth daily.  Patient not taking: Reported on 09/18/2020   End, Cristal Deer, MD Not Taking Active   baclofen (LIORESAL) 10 MG  tablet [id number] Yes Take 10 mg by mouth 2 (two) times daily. [provider] Taking Active   Biotin 10 MG TABS [id number] Yes Take by mouth. [provider] Taking Active   Cholecalciferol (VITAMIN D3) 1000 units CAPS [id number] Yes Take by mouth daily.  [provider] Taking Active            Med Note ([facility], BRANDY L   Wed Jan 12, 2020  1:21 PM)    clobetasol ointment (TEMOVATE) 0.05 % [id number]  Apply topically. Vaginally [provider]  Active            Med Note Ara Kussmaul Sep 18, 2020  1:46 PM)    Continuous Blood Gluc Sensor (FREESTYLE LIBRE [address] [place]) [place] [id number] Yes SMARTSIG:1 Each Topical Every 2 Weeks [provider] Taking Active   Cyanocobalamin 1000 MCG CAPS [id number] Yes Take by mouth. Takes occassionally [provider] Taking Active   dicyclomine (BENTYL) 10 MG capsule [id number] Yes Take 1 capsule (10 mg total) by mouth 3 (three) times daily as needed for spasms. Shelia Media, MD Taking Active   Emollient [facility]) CREA [id number] Yes Apply topically daily.  [provider] Taking Active   Evolocumab (REPATHA SURECLICK) 140 MG/ML SOAJ [id number] No Inject 140 mg into the skin every 14 (fourteen) days.  Patient not taking: Reported on 09/18/2020   Glori Luis, MD Not Taking Active   famotidine (PEPCID) 20 MG tablet [id number] Yes Take 1 tablet (20 mg total) by mouth at bedtime for 14 days.  Patient taking differently: Take 20 mg by mouth daily. LRN   Tracey Harries, FNP Taking Expired 06/14/20 2359   ibuprofen (ADVIL) 200 MG tablet [id number] Yes Take 200 mg by mouth every 6 (six) hours as needed. [provider] Taking Active   insulin aspart (NOVOLOG) 100 UNIT/ML FlexPen [id number] Yes Sliding scale up to three times daily with meals [provider] Taking Active   ketoconazole (NIZORAL) 2 % cream [id number] Yes Apply 1 application topically 2 (two) times  daily. [provider] Taking Active   ketoconazole (NIZORAL) 2 % shampoo [id number] Yes Apply 1 application topically once a week. [provider] Taking Active   losartan (COZAAR) 25 MG tablet [id number] No Take 0.5 tablets (12.5 mg total) by mouth daily.  Patient not taking: No sig reported   End, Christopher, MD Not Taking Active   montelukast (SINGULAIR) 10 MG tablet [id number] Yes Take 1 tablet (10 mg total) by mouth at bedtime. Glori Luis, MD Taking Active            Med Note Ara Kussmaul Sep 18, 2020  1:49 PM) Using PRN  ondansetron (ZOFRAN) 4 MG tablet [id number] No TAKE 1 TABLET  BY MOUTH EVERY 8 HOURS AS NEEDED FOR NAUSEA AND VOMITING  Patient not taking: Reported on 09/18/2020   [name], MD Not Taking Active   Polyethyl Glycol-Propyl Glycol [facility] OP) [id number] Yes Apply to eye. [provider] Taking Active   pseudoephedrine-guaifenesin (MUCINEX D) 60-600 MG 12 hr tablet [id number] Yes Take 1 tablet by mouth 2 (two) times daily as needed.  [provider] Taking Active   Tiotropium Bromide Monohydrate (SPIRIVA RESPIMAT) 1.25 MCG/ACT AERS [id number] No Inhale 2 puffs into the lungs daily.  Patient not taking: Reported on 09/18/2020   Flora Lipps, MD Not Taking Active   traMADol (ULTRAM) 50 MG tablet [id number] Yes TAKE 1 TABLET BY MOUTH EVERY 6 HOURS AS NEEDED. FOR PAIN [name], MD Taking Active   traZODone (DESYREL) 50 MG tablet [id number] Yes TAKE 0.5-1 TABLETS BY MOUTH AT BEDTIME AS NEEDED FOR SLEEP. [name], MD Taking Active   TRESIBA FLEXTOUCH 100 UNIT/ML SOPN FlexTouch Pen [id number] Yes 20 Units at bedtime. [provider] Taking Active            Med Note Mayo Ao Sep 18, 2020  1:47 PM) 6 units QPM  triamcinolone (NASACORT) 55 MCG/ACT AERO nasal inhaler [id number] Yes Place 2 sprays into the nose daily. [provider] Taking Active           Patient  Active Problem List   Diagnosis Date Noted  . Elevated LFTs 08/04/2020  . Chronic heart failure with preserved ejection fraction (HFpEF) ([place]) 06/15/2020  . Elevated aldolase level 04/28/2020  . Tick bite of back 02/14/2020  . Non-ischemic cardiomyopathy ([place]) 01/13/2020  . History of chest pain 10/20/2019  . Former smoker 08/30/2019  . Restrictive lung disease 08/30/2019  . Foot pain 02/16/2019  . Hair loss 08/31/2018  . Bilateral leg cramps 05/27/2018  . Memory difficulty 04/27/2018  . Iliotibial band syndrome 04/27/2018  . Respiratory illness 04/27/2018  . Allergic rhinitis 01/26/2018  . RUQ pain 10/28/2017  . Obesity (BMI 30.0-34.9) 10/28/2017  . Pain in limb 06/03/2017  . Chest pain 01/17/2017  . Throat fullness 10/15/2016  . 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 Rash 08/08/2016  . Lower abdominal pain 08/08/2016  . Cephalalgia 01/26/2016  . Dysuria 12/20/2015  . Vitamin D deficiency 12/20/2015  . Sjogren's syndrome ([place]) 12/20/2015  . Chronic fatigue 12/04/2015  . Insomnia 10/19/2015  . Chronic bilateral low back pain without sciatica 11/07/2014  . OSA on CPAP 10/19/2014  . Anxiety and depression 08/16/2014  . Allergic urticaria 06/02/2014  . Dyspnea 05/04/2014  . Diabetes type 2, uncontrolled ([place]) 07/06/2013  . Malignant neoplasm of breast (female) ([place]) 02/28/2013  . Blood clotting disorder ([place]) 12/01/2012  . Palpitations 12/01/2012  . Hyperlipidemia 12/01/2012  . Chronic pain disorder 06/11/2012  . Hearing loss 09/16/2011  . Fibromyalgia 06/15/2011    Conditions to be addressed/monitored: HLD and DM  Patient Care Plan: Medication Management    Problem Identified: Hyperlipidemia, Diabetes, COPD     Long-Range Goal: Disease Progression Prevention   Recent Progress: On track  Priority: High  Note:   Current Barriers:  . Unable to independently afford treatment regimen . Unable to achieve control of cholesterol   Pharmacist Clinical Goal(s):   Marland Kitchen Over the next 90 days, patient will verbalize ability to afford treatment regimen . Over the next 90 days, patient will achieve control of cholesterol through collaboration with PharmD and provider.  .   Interventions: .  1:1 collaboration with [name], MD regarding development and update of comprehensive plan of care as evidenced by provider attestation and co-signature . Inter-disciplinary care team collaboration (see longitudinal plan of care) . Comprehensive medication review performed; medication list updated in electronic medical record  Diabetes: . Uncontrolled; current treatment: Tresiba 6 units daily, Novolog 2-4 units TID per sliding scale; follows w/ Malissa Hippo at [facility]  o Hx intolerance to: metformin (GI upset), Invokana (yeast infections), Jardiance (pancreatitis), Trulicity (abdominal pain), glipizide XR (hypoglycemia) . Patient has not had the opportunity to come sign off on [employer] paperwork for assistance. She will do so within the week. Will collaborate w/ CPhT to submit and follow up.  Hyperlipidemia and ASCVD risk reduction: . Uncontrolled; current treatment: prescribed Repatha, but she notes that she was unable to afford because she is in the Medicare Coverage Gap . Antiplatelet regimen: aspirin 81 mg daily - patient notes she stopped taking d/t recent elevation in liver enzymes . Medications previously tried: rosuvastatin - liver enzyme elevations with therapy.  . Patient called, noted that she attempted to fill Repatha but cost was $100. Investigated insurance coverage. Her plan covers Repatha at Tier 4, there is not a Tier 3 preferred alternative (besides Welchol, which is not a clinical substitution). Completed Tiering Exception request for Repatha. Will follow.   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 Uncontrolled; current treatment: prescribed Spiriva, but notes cost was too much (was likely in Coverage Gap). Albuterol HFA PRN,  using very infrequently. Montelukast 10 mg daily, triamcinolone nasal daily PRN. Follows w/ Dr. Mortimer Fries  . 1 exacerbation in the last 6  . Will discuss Spiriva vs Symbicort with patient and respiratory team moving forward. Patient over income for BI assistance, but would qualify for [place] assistance.   Anxiety: . Uncontrolled; current regimen: alprazolam 0.5 mg up to QID PRN anxiety, notes she is taking TID o Previously tried: escitalopram (noted rash) . Will further discuss treatment and safety moving forward.   Pain: . Patient notes controlled; current regimen: tramadol 50 mg Q6H PRN, not needing every day. Baclofen 10 mg BID PRN.  Marland Kitchen Recommend to continue current regimen at this time  Gastrointestinal Concerns: . Current regimen: dicyclomine 10 mg TID PRN, famotidine 20 mg daily PRN . Recommend to current regimen at this time  Patient Goals/Self-Care Activities . Over the next 90 days, patient will:  - take medications as prescribed check glucose TID using CGM, document, and provide at future appointments collaborate with provider on medication access solutions  Follow Up Plan: Telephone follow up appointment with care management team member scheduled for: ~ 3 weeks as previously scheduled       Medication Assistance: Application for [employer] [name], [place]) medication assistance program in process. Anticipated assistance start date TBD. See plan of care above for additional detail.   Plan: Telephone follow up appointment with care management team member scheduled for: ~ 3 weeks as previously scheduled  Catie Darnelle Maffucci, PharmD, [place], [place] Clinical Pharmacist [employer] [facility] [employer] [phone]

## 2020-10-05 ENCOUNTER — Telehealth: Payer: Self-pay

## 2020-10-05 NOTE — Telephone Encounter (Signed)
I received a PA approval for Repatha for  he patient through 09/29/2021 from [place].  Demetrius Barrell,cma

## 2020-10-10 ENCOUNTER — Encounter (HOSPITAL_COMMUNITY): Payer: Self-pay

## 2020-10-10 ENCOUNTER — Inpatient Hospital Stay (HOSPITAL_COMMUNITY): Admission: RE | Admit: 2020-10-10 | Payer: PPO | Source: Ambulatory Visit

## 2020-10-11 ENCOUNTER — Ambulatory Visit: Payer: PPO | Admitting: Pharmacist

## 2020-10-11 DIAGNOSIS — K743 Primary biliary cirrhosis: Secondary | ICD-10-CM

## 2020-10-11 DIAGNOSIS — E785 Hyperlipidemia, unspecified: Secondary | ICD-10-CM

## 2020-10-11 DIAGNOSIS — E1165 Type 2 diabetes mellitus with hyperglycemia: Secondary | ICD-10-CM

## 2020-10-11 NOTE — Chronic Care Management (AMB) (Signed)
Chronic Care Management Pharmacy Note  10/11/2020 Name:  Kristina Mccoy MRN:  [id number] DOB:  1955-11-13  Subjective: Kristina Mccoy is an 65 y.o. year old female who is a primary patient of Caryl Bis, Angela Adam, MD.  The CCM team was consulted for assistance with disease management and care coordination needs.    Engaged with patient by telephone for medication access in response to provider referral for pharmacy case management and/or care coordination services.   Consent to Services:  The patient was given information about Chronic Care Management services, agreed to services, and gave verbal consent prior to initiation of services.  Please see initial visit note for detailed documentation.   Objective:  Lab Results  Component Value Date   CREATININE 0.66 07/03/2020   CREATININE 0.79 05/08/2020   CREATININE 0.86 05/04/2020    Lab Results  Component Value Date   HGBA1C 8.0 (H) 02/15/2020       Component Value Date/Time   CHOL 289 (H) 04/28/2020 1446   CHOL 113 09/03/2014 0352   TRIG 177 (H) 04/28/2020 1446   TRIG 107 09/03/2014 0352   HDL 54 04/28/2020 1446   HDL 16 (L) 09/03/2014 0352   CHOLHDL 5.4 (H) 04/28/2020 1446   VLDL 23.6 04/27/2018 1419   VLDL 21 09/03/2014 0352   LDLCALC 200 (H) 04/28/2020 1446   LDLCALC 76 09/03/2014 0352   LDLDIRECT 153.0 06/19/2020 0941     BP Readings from Last 3 Encounters:  08/18/20 122/68  07/31/20 118/70  07/27/20 112/64    Assessment/Interventions: Review of patient past medical history, allergies, medications, health status, including review of consultants reports, laboratory and other test data, was performed as part of comprehensive evaluation and provision of chronic care management services.   SDOH:  (Social Determinants of Health) assessments and interventions performed:  SDOH Interventions   Flowsheet Row Most Recent Value  SDOH Interventions   Financial Strain Interventions Other (Comment)  [manufacturer  assistance]      CCM Care Plan  Allergies  Allergen Reactions  . Gabapentin Itching and Swelling  . Oxycodone Itching and Rash  . Carbamazepine Nausea And Vomiting and Other (See Comments)    Abdominal pain   . Doxycycline Other (See Comments)  . Dulaglutide     Other reaction(s): Other (See Comments) Severe stomach pain  . Insulin Glargine Other (See Comments)    BLOATING, MUSCLE/JOINT PAIN  . Levaquin [Levofloxacin]     Diarrhea and pain in calf  . Metformin     Other reaction(s): Other (See Comments) Cramping- abdominal pain  . Methylprednisolone     Reddened face, puffy face   . Morphine Other (See Comments)  . Pregabalin Swelling    Swelling in arms and legs  . Venlafaxine Other (See Comments)  . Buprenorphine Rash and Swelling  . Canagliflozin Other (See Comments) and Rash    YEAST INFECTIONS  . Escitalopram Rash  . Hydroxychloroquine Rash    Medications Reviewed Today    Reviewed by De Hollingshead, RPH-CPP (Pharmacist) on 09/18/20 at 1351  Med List Status: <None>  Medication Order Taking? Sig Documenting Provider Last Dose Status Informant  albuterol (VENTOLIN HFA) 108 (90 Base) MCG/ACT inhaler [id number] Yes Inhale 2 puffs into the lungs every 4 (four) hours as needed for wheezing or shortness of breath. Sharion Balloon, NP Taking Active   ALPRAZolam Duanne Moron) 0.5 MG tablet [id number] Yes TAKE 1 TABLET (0.5 MG TOTAL) BY MOUTH 4 (FOUR) TIMES DAILY AS NEEDED FOR ANXIETY. [name]  G, MD Taking Active   aspirin EC 81 MG tablet [id number] No Take 1 tablet (81 mg total) by mouth daily.  Patient not taking: Reported on 09/18/2020   End, Harrell Gave, MD Not Taking Active   baclofen (LIORESAL) 10 MG tablet [id number] Yes Take 10 mg by mouth 2 (two) times daily. [provider] Taking Active   Biotin 10 MG TABS [id number] Yes Take by mouth. [provider] Taking Active   Cholecalciferol (VITAMIN D3) 1000 units CAPS [id number] Yes Take by mouth daily.   [provider] Taking Active            Med Note ([facility], BRANDY L   Wed Jan 12, 2020  1:21 PM)    clobetasol ointment (TEMOVATE) 0.05 % [id number]  Apply topically. Vaginally [provider]  Active            Med Note Mayo Ao Sep 18, 2020  1:46 PM)    Continuous Blood Gluc Sensor (FREESTYLE LIBRE 14 [place]) [place] [id number] Yes SMARTSIG:1 Each Topical Every 2 Weeks [provider] Taking Active   Cyanocobalamin [place] [id number] Yes Take by mouth. Takes occassionally [provider] Taking Active   dicyclomine (BENTYL) 10 MG capsule [id number] Yes Take 1 capsule (10 mg total) by mouth 3 (three) times daily as needed for spasms. Jackolyn Confer, MD Taking Active   Emollient [facility]) CREA [id number] Yes Apply topically daily.  [provider] Taking Active   Evolocumab (REPATHA SURECLICK) 706 MG/ML SOAJ [id number] No Inject 140 mg into the skin every 14 (fourteen) days.  Patient not taking: Reported on 09/18/2020   [name], MD Not Taking Active   famotidine (PEPCID) 20 MG tablet [id number] Yes Take 1 tablet (20 mg total) by mouth at bedtime for 14 days.  Patient taking differently: Take 20 mg by mouth daily. LRN   Jodelle Green, FNP Taking Expired 06/14/20 2359   ibuprofen (ADVIL) 200 MG tablet [id number] Yes Take 200 mg by mouth every 6 (six) hours as needed. [provider] Taking Active   insulin aspart (NOVOLOG) 100 UNIT/ML FlexPen [id number] Yes Sliding scale up to three times daily with meals [provider] Taking Active   ketoconazole (NIZORAL) 2 % cream [id number] Yes Apply 1 application topically 2 (two) times daily. [provider] Taking Active   ketoconazole (NIZORAL) 2 % shampoo [id number] Yes Apply 1 application topically once a week. [provider] Taking Active   losartan (COZAAR) 25 MG tablet [id number] No Take 0.5 tablets (12.5 mg total) by mouth daily.   Patient not taking: No sig reported   End, Christopher, MD Not Taking Active   montelukast (SINGULAIR) 10 MG tablet [id number] Yes Take 1 tablet (10 mg total) by mouth at bedtime. [name], MD Taking Active            Med Note Mayo Ao Sep 18, 2020  1:49 PM) Using PRN  ondansetron (ZOFRAN) 4 MG tablet [id number] No TAKE 1 TABLET BY MOUTH EVERY 8 HOURS AS NEEDED FOR NAUSEA AND VOMITING  Patient not taking: Reported on 09/18/2020   [name], MD Not Taking Active   Polyethyl Glycol-Propyl Glycol [facility]) [id number] Yes Apply to eye. [provider] Taking Active   pseudoephedrine-guaifenesin (MUCINEX D) 60-600 MG 12 hr tablet [id number] Yes Take 1 tablet by mouth 2 (two) times daily as needed.  [provider] Taking Active  Tiotropium Bromide Monohydrate (SPIRIVA RESPIMAT) 1.25 MCG/ACT AERS [id number] No Inhale 2 puffs into the lungs daily.  Patient not taking: Reported on 09/18/2020   Flora Lipps, MD Not Taking Active   traMADol (ULTRAM) 50 MG tablet [id number] Yes TAKE 1 TABLET BY MOUTH EVERY 6 HOURS AS NEEDED. FOR PAIN [name], MD Taking Active   traZODone (DESYREL) 50 MG tablet [id number] Yes TAKE 0.5-1 TABLETS BY MOUTH AT BEDTIME AS NEEDED FOR SLEEP. [name], MD Taking Active   TRESIBA FLEXTOUCH 100 UNIT/ML SOPN FlexTouch Pen [id number] Yes 20 Units at bedtime. [provider] Taking Active            Med Note Mayo Ao Sep 18, 2020  1:47 PM) 6 units QPM  triamcinolone (NASACORT) 55 MCG/ACT AERO nasal inhaler [id number] Yes Place 2 sprays into the nose daily. [provider] Taking Active           Patient Active Problem List   Diagnosis Date Noted  . Elevated LFTs 08/04/2020  . Chronic heart failure with preserved ejection fraction (HFpEF) ([place]) 06/15/2020  . Elevated aldolase level 04/28/2020  . Tick bite of back 02/14/2020  . Non-ischemic cardiomyopathy ([place]) 01/13/2020   . History of chest pain 10/20/2019  . Former smoker 08/30/2019  . Restrictive lung disease 08/30/2019  . Foot pain 02/16/2019  . Hair loss 08/31/2018  . Bilateral leg cramps 05/27/2018  . Memory difficulty 04/27/2018  . Iliotibial band syndrome 04/27/2018  . Respiratory illness 04/27/2018  . Allergic rhinitis 01/26/2018  . RUQ pain 10/28/2017  . Obesity (BMI 30.0-34.9) 10/28/2017  . Pain in limb 06/03/2017  . Chest pain 01/17/2017  . Throat fullness 10/15/2016  . Primary biliary cirrhosis ([place]) 10/15/2016  . Rash 08/08/2016  . Lower abdominal pain 08/08/2016  . Cephalalgia 01/26/2016  . Dysuria 12/20/2015  . Vitamin D deficiency 12/20/2015  . Sjogren's syndrome ([place]) 12/20/2015  . Chronic fatigue 12/04/2015  . Insomnia 10/19/2015  . Chronic bilateral low back pain without sciatica 11/07/2014  . OSA on CPAP 10/19/2014  . Anxiety and depression 08/16/2014  . Allergic urticaria 06/02/2014  . Dyspnea 05/04/2014  . Diabetes type 2, uncontrolled ([place]) 07/06/2013  . Malignant neoplasm of breast (female) ([place]) 02/28/2013  . Blood clotting disorder ([place]) 12/01/2012  . Palpitations 12/01/2012  . Hyperlipidemia 12/01/2012  . Chronic pain disorder 06/11/2012  . Hearing loss 09/16/2011  . Fibromyalgia 06/15/2011    Conditions to be addressed/monitored: HTN, HLD and DMII  Care Plan : Medication Management  Updates made by De Hollingshead, RPH-CPP since 10/11/2020 12:00 AM    Problem: Hyperlipidemia, Diabetes, COPD     Long-Range Goal: Disease Progression Prevention   Recent Progress: On track  Priority: High  Note:   Current Barriers:  . Unable to independently afford treatment regimen . Unable to achieve control of cholesterol   Pharmacist Clinical Goal(s):  Marland Kitchen Over the next 90 days, patient will verbalize ability to afford treatment regimen . Over the next 90 days, patient will achieve control of cholesterol through collaboration with PharmD and provider.    Interventions: . 1:1 collaboration with [name], MD regarding development and update of comprehensive plan of care as evidenced by provider attestation and co-signature . Inter-disciplinary care team collaboration (see longitudinal plan of care) . Comprehensive medication review performed; medication list updated in electronic medical record  Diabetes: . Uncontrolled; current treatment: Tresiba 6 units daily, Novolog 2-4 units TID per sliding scale; follows w/ Deidre Ala  [place] at [facility]  o Hx intolerance to: metformin (GI upset), Invokana (yeast infections), Jardiance (pancreatitis), Trulicity (abdominal pain), glipizide XR (hypoglycemia) . Received patient and provider portion of application, as well as income information. [employer] application. Will collaborate w/ CPhT for follo wup.   Hyperlipidemia and ASCVD risk reduction: . Uncontrolled; current treatment: prescribed Repatha, but she notes that she was unable to afford because she is in the Medicare Coverage Gap . Antiplatelet regimen: aspirin 81 mg daily - patient notes she stopped taking d/t recent elevation in liver enzymes . Medications previously tried: rosuvastatin - liver enzyme elevations with therapy.  . Plan to f/u on cost in February. Will continue to follow.    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 Uncontrolled; current treatment: prescribed Spiriva, but notes cost was too much (was likely in Coverage Gap). Albuterol HFA PRN, using very infrequently. Montelukast 10 mg daily, triamcinolone nasal daily PRN. Follows w/ Dr. Mortimer Fries  . 1 exacerbation in the last 6  . Will discuss Spiriva vs Symbicort with patient and respiratory team moving forward. Patient over income for BI assistance, but would qualify for [place] assistance. Will continue to follow.   Anxiety: . Uncontrolled; current regimen: alprazolam 0.5 mg up to QID PRN anxiety, notes she is taking TID o Previously tried: escitalopram (noted  rash) . Will further discuss treatment and safety moving forward.   Pain: . Patient notes controlled; current regimen: tramadol 50 mg Q6H PRN, not needing every day. Baclofen 10 mg BID PRN.  Marland Kitchen Recommend to continue current regimen at this time  Gastrointestinal Concerns: . Current regimen: dicyclomine 10 mg TID PRN, famotidine 20 mg daily PRN . Recommend to current regimen at this time  Patient Goals/Self-Care Activities . Over the next 90 days, patient will:  - take medications as prescribed check glucose TID using CGM, document, and provide at future appointments collaborate with provider on medication access solutions  Follow Up Plan: Telephone follow up appointment with care management team member scheduled for: ~ 2 weeks as previously scheduled       Medication Assistance: Application for Novo medication assistance program in process. Anticipated assistance start date TBD. See plan of care below for additional detail.   Follow Up:  Patient agrees to Care Plan and Follow-up.  Plan: Telephone follow up appointment with care management team member scheduled for:  ~ 3 weeks as previously scheduled  Catie Darnelle Maffucci, PharmD, [place], [place] Clinical Pharmacist [employer] at [employer] [phone]

## 2020-10-11 NOTE — Patient Instructions (Signed)
Visit Information  Patient Care Plan: Medication Management    Problem Identified: Hyperlipidemia, Diabetes, COPD     Long-Range Goal: Disease Progression Prevention   Recent Progress: On track  Priority: High  Note:   Current Barriers:  . Unable to independently afford treatment regimen . Unable to achieve control of cholesterol   Pharmacist Clinical Goal(s):  Marland Kitchen Over the next 90 days, patient will verbalize ability to afford treatment regimen . Over the next 90 days, patient will achieve control of cholesterol through collaboration with PharmD and provider.   Interventions: . 1:1 collaboration with [name], MD regarding development and update of comprehensive plan of care as evidenced by provider attestation and co-signature . Inter-disciplinary care team collaboration (see longitudinal plan of care) . Comprehensive medication review performed; medication list updated in electronic medical record  Diabetes: . Uncontrolled; current treatment: Tresiba 6 units daily, Novolog 2-4 units TID per sliding scale; follows w/ Malissa Hippo at [facility]  o Hx intolerance to: metformin (GI upset), Invokana (yeast infections), Jardiance (pancreatitis), Trulicity (abdominal pain), glipizide XR (hypoglycemia) . Received patient and provider portion of application, as well as income information. [employer] application. Will collaborate w/ CPhT for follo wup.   Hyperlipidemia and ASCVD risk reduction: . Uncontrolled; current treatment: prescribed Repatha, but she notes that she was unable to afford because she is in the Medicare Coverage Gap . Antiplatelet regimen: aspirin 81 mg daily - patient notes she stopped taking d/t recent elevation in liver enzymes . Medications previously tried: rosuvastatin - liver enzyme elevations with therapy.  . Plan to f/u on cost in February. Will continue to follow.    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 Uncontrolled; current  treatment: prescribed Spiriva, but notes cost was too much (was likely in Coverage Gap). Albuterol HFA PRN, using very infrequently. Montelukast 10 mg daily, triamcinolone nasal daily PRN. Follows w/ Dr. Mortimer Fries  . 1 exacerbation in the last 6  . Will discuss Spiriva vs Symbicort with patient and respiratory team moving forward. Patient over income for BI assistance, but would qualify for [place] assistance. Will continue to follow.   Anxiety: . Uncontrolled; current regimen: alprazolam 0.5 mg up to QID PRN anxiety, notes she is taking TID o Previously tried: escitalopram (noted rash) . Will further discuss treatment and safety moving forward.   Pain: . Patient notes controlled; current regimen: tramadol 50 mg Q6H PRN, not needing every day. Baclofen 10 mg BID PRN.  Marland Kitchen Recommend to continue current regimen at this time  Gastrointestinal Concerns: . Current regimen: dicyclomine 10 mg TID PRN, famotidine 20 mg daily PRN . Recommend to current regimen at this time  Patient Goals/Self-Care Activities . Over the next 90 days, patient will:  - take medications as prescribed check glucose TID using CGM, document, and provide at future appointments collaborate with provider on medication access solutions  Follow Up Plan: Telephone follow up appointment with care management team member scheduled for: ~ 2 weeks as previously scheduled       The patient verbalized understanding of instructions, educational materials, and care plan provided today and declined offer to receive copy of patient instructions, educational materials, and care plan.   Plan: Telephone follow up appointment with care management team member scheduled for:  ~ 3 weeks as previously scheduled  Catie Darnelle Maffucci, PharmD, [place], [place] Clinical Pharmacist [employer] at [employer] [phone]

## 2020-10-12 ENCOUNTER — Other Ambulatory Visit: Payer: Self-pay | Admitting: Family Medicine

## 2020-10-13 ENCOUNTER — Telehealth: Payer: Self-pay | Admitting: Family Medicine

## 2020-10-13 DIAGNOSIS — E785 Hyperlipidemia, unspecified: Secondary | ICD-10-CM | POA: Diagnosis not present

## 2020-10-13 DIAGNOSIS — E1165 Type 2 diabetes mellitus with hyperglycemia: Secondary | ICD-10-CM | POA: Diagnosis not present

## 2020-10-13 DIAGNOSIS — E1169 Type 2 diabetes mellitus with other specified complication: Secondary | ICD-10-CM | POA: Diagnosis not present

## 2020-10-13 DIAGNOSIS — Z794 Long term (current) use of insulin: Secondary | ICD-10-CM | POA: Diagnosis not present

## 2020-10-13 NOTE — Telephone Encounter (Signed)
Patient would like a call back from Catie.

## 2020-10-16 NOTE — Telephone Encounter (Signed)
Please be on the look out for the PA once we are back in the office.

## 2020-10-16 NOTE — Telephone Encounter (Signed)
Patient states that the pharmacy told her a prior authorization would be needed for the tramadol.  Sending to [facility] and Dr. Caryl Bis

## 2020-10-16 NOTE — Telephone Encounter (Signed)
Left voicemail for patient to return my call at her convenience.

## 2020-10-17 DIAGNOSIS — G4733 Obstructive sleep apnea (adult) (pediatric): Secondary | ICD-10-CM | POA: Diagnosis not present

## 2020-10-18 ENCOUNTER — Other Ambulatory Visit: Payer: Self-pay | Admitting: Family Medicine

## 2020-10-23 ENCOUNTER — Telehealth: Payer: Self-pay

## 2020-10-23 ENCOUNTER — Telehealth: Payer: PPO

## 2020-10-23 NOTE — Chronic Care Management (AMB) (Signed)
  Care Management   Note  10/23/2020 Name: Kristina Mccoy MRN: [id number] DOB: [birth year]  Kristina Mccoy is a 65 y.o. year old female who is a primary care patient of [name], MD and is actively engaged with the care management team. I reached out to [place] by phone today to assist with re-scheduling a follow up visit with the Pharmacist  Follow up plan: Unsuccessful telephone outreach attempt made. A HIPAA compliant phone message was left for the patient providing contact information and requesting a return call.  The care management team will reach out to the patient again over the next 2 days.  If patient returns call to provider office, please advise to call [place]  at [place], [place], [place], [place] [address] Direct Dial: [phone] [email or web address] Website: [email or web address]

## 2020-10-23 NOTE — Chronic Care Management (AMB) (Signed)
  Care Management   Note  10/23/2020 Name: [name] MRN: [id number] DOB: 1956/06/27  Kristina Mccoy is a 65 y.o. year old female who is a primary care patient of Caryl Bis, Angela Adam, MD and is actively engaged with the care management team. I reached out to [place] by phone today to assist with re-scheduling a follow up visit with the Pharmacist  Follow up plan: Telephone appointment with care management team member scheduled for:10/26/2020  Noreene Larsson, [place], [place], [place] [address] Direct Dial: [phone] [email or web address] Website: [email or web address]

## 2020-10-24 ENCOUNTER — Telehealth: Payer: Self-pay | Admitting: Family Medicine

## 2020-10-24 NOTE — Telephone Encounter (Signed)
-----   Message from Jason Fila, CPhT sent at 10/24/2020 11:25 AM EST ----- Regarding: patient had question about side effects of Repatha Good am!  Patient left me a voicemail late yesterday evening after I had already signed off for the day.  She wanted to speak to "someone sooner rather than later" about side effects she has been experiencing with Repatha. She did not specify in the voicemail what the side effects were.  I believe Catie is on PAL today and was wondering if someone would be able to speak to this patient about her concerns.  Thanks,  Luiz Ochoa. [place], [place]  [phone]

## 2020-10-24 NOTE — Telephone Encounter (Signed)
Please call the patient and see what her concerns are regarding the repatha. Thanks.

## 2020-10-24 NOTE — Telephone Encounter (Signed)
Called and spoke to [place]. Kristina Mccoy states that during both doses of the Repatha medication, one on 10/21/2020 and the other 2 weeks prior, She has experienced Headaches, sore throat, bloating, increased urination, dizziness, lower back pain, and an increase of blood sugar. Kristina Mccoy states that she has went up on her insulin to combat the increased blood sugar. She states that she has not had any dietary changes to cause the bloating. Pt states that she is most concerned over the bloating as she feels that her stomach is distended by a couple of inches. She states that her next dose of Repatha is 2 weeks from 10/21/20.

## 2020-10-25 ENCOUNTER — Telehealth: Payer: Self-pay | Admitting: *Deleted

## 2020-10-25 NOTE — Telephone Encounter (Signed)
Those are all potential risks and side effects.  I would have her hold her next dose of the Repatha and see if the symptoms resolve or if they recur without using the medication.  FYI to Catie

## 2020-10-25 NOTE — Telephone Encounter (Signed)
-----   Message from Jill P [place], CPhT sent at 10/24/2020 11:25 AM EST ----- Regarding: patient had question about side effects of Repatha Good am!  Patient left me a voicemail late yesterday evening after I had already signed off for the day.  She wanted to speak to "someone sooner rather than later" about side effects she has been experiencing with Repatha. She did not specify in the voicemail what the side effects were.  I believe Catie is on PAL today and was wondering if someone would be able to speak to this patient about her concerns.  Thanks,  Jill P. [place], CPhT [facility]  [phone]    

## 2020-10-25 NOTE — Telephone Encounter (Signed)
Patient say she has a headache immediately follow ing injection, and that she feels bloated in her abdomen and has lower back pain. Patient say she also has noticed an increase in CBG readings and has adjusted her insulin for the increase. Patient las dose was 10/21/20.

## 2020-10-25 NOTE — Telephone Encounter (Signed)
Called and spoke to [place]. Webb Silversmith verbalized understanding and had no further questions.

## 2020-10-26 ENCOUNTER — Ambulatory Visit: Payer: PPO | Admitting: Pharmacist

## 2020-10-26 DIAGNOSIS — I428 Other cardiomyopathies: Secondary | ICD-10-CM

## 2020-10-26 DIAGNOSIS — G894 Chronic pain syndrome: Secondary | ICD-10-CM

## 2020-10-26 DIAGNOSIS — K743 Primary biliary cirrhosis: Secondary | ICD-10-CM

## 2020-10-26 DIAGNOSIS — E1165 Type 2 diabetes mellitus with hyperglycemia: Secondary | ICD-10-CM

## 2020-10-26 DIAGNOSIS — E785 Hyperlipidemia, unspecified: Secondary | ICD-10-CM

## 2020-10-26 NOTE — Patient Instructions (Signed)
Visit Information  Goals Addressed              This Visit's Progress     Patient Stated   .  Medication Monitoring (pt-stated)        Patient Goals/Self-Care Activities . Over the next 90 days, patient will:  - take medications as prescribed check glucose TID using CGM, document, and provide at future appointments collaborate with provider on medication access solutions         Patient verbalizes understanding of instructions provided today and agrees to view in [place].   Plan: Telephone follow up appointment with care management team member scheduled for:  ~ 4 weeks  Catie Darnelle Maffucci, PharmD, [place], [place] Clinical Pharmacist [employer] at [employer] [phone]

## 2020-10-26 NOTE — Chronic Care Management (AMB) (Signed)
Chronic Care Management Pharmacy Note  10/26/2020 Name:  Kristina Mccoy MRN:  [id number] DOB:  [birth year]  Subjective: Kristina Mccoy is an 65 y.o. year old female who is a primary patient of Caryl Bis, Angela Adam, MD.  The CCM team was consulted for assistance with disease management and care coordination needs.    Engaged with patient by telephone for follow up visit in response to provider referral for pharmacy case management and/or care coordination services.   Consent to Services:  The patient was given information about Chronic Care Management services, agreed to services, and gave verbal consent prior to initiation of services.  Please see initial visit note for detailed documentation.   Objective:  Lab Results  Component Value Date   CREATININE 0.66 07/03/2020   CREATININE 0.79 05/08/2020   CREATININE 0.86 05/04/2020    Lab Results  Component Value Date   HGBA1C 8.0 (H) 02/15/2020       Component Value Date/Time   CHOL 289 (H) 04/28/2020 1446   CHOL 113 09/03/2014 0352   TRIG 177 (H) 04/28/2020 1446   TRIG 107 09/03/2014 0352   HDL 54 04/28/2020 1446   HDL 16 (L) 09/03/2014 0352   CHOLHDL 5.4 (H) 04/28/2020 1446   VLDL 23.6 04/27/2018 1419   VLDL 21 09/03/2014 0352   LDLCALC 200 (H) 04/28/2020 1446   LDLCALC 76 09/03/2014 0352   LDLDIRECT 153.0 06/19/2020 0941    Clinical ASCVD: No  The 10-year ASCVD risk score Mikey Bussing DC Jr., et al., 2013) is: 6%   Values used to calculate the score:     Age: 84 years     Sex: Female     Is Non-Hispanic African American: No     Diabetic: Yes     Tobacco smoker: No     Systolic Blood Pressure: 98 mmHg     Is BP treated: No     HDL Cholesterol: 54 mg/dL     Total Cholesterol: 289 mg/dL    Other: (CHADS2VASc if Afib, PHQ9 if depression, MMRC or CAT for COPD, ACT, DEXA)  BP Readings from Last 3 Encounters:  08/18/20 122/68  07/31/20 118/70  07/27/20 112/64    Assessment: Review of patient past medical history,  allergies, medications, health status, including review of consultants reports, laboratory and other test data, was performed as part of comprehensive evaluation and provision of chronic care management services.   SDOH:  (Social Determinants of Health) assessments and interventions performed:  SDOH Interventions   Flowsheet Row Most Recent Value  SDOH Interventions   Financial Strain Interventions Intervention Not Indicated      CCM Care Plan  Allergies  Allergen Reactions  . Gabapentin Itching and Swelling  . Oxycodone Itching and Rash  . Carbamazepine Nausea And Vomiting and Other (See Comments)    Abdominal pain   . Doxycycline Other (See Comments)  . Dulaglutide     Other reaction(s): Other (See Comments) Severe stomach pain  . Insulin Glargine Other (See Comments)    BLOATING, MUSCLE/JOINT PAIN  . Levaquin [Levofloxacin]     Diarrhea and pain in calf  . Metformin     Other reaction(s): Other (See Comments) Cramping- abdominal pain  . Methylprednisolone     Reddened face, puffy face   . Morphine Other (See Comments)  . Pregabalin Swelling    Swelling in arms and legs  . Venlafaxine Other (See Comments)  . Buprenorphine Rash and Swelling  . Canagliflozin Other (See Comments) and Rash  YEAST INFECTIONS  . Escitalopram Rash  . Hydroxychloroquine Rash    Medications Reviewed Today    Reviewed by De Hollingshead, RPH-CPP (Pharmacist) on 09/18/20 at 1351  Med List Status: <None>  Medication Order Taking? Sig Documenting Provider Last Dose Status Informant  albuterol (VENTOLIN HFA) 108 (90 Base) MCG/ACT inhaler [id number] Yes Inhale 2 puffs into the lungs every 4 (four) hours as needed for wheezing or shortness of breath. Sharion Balloon, NP Taking Active   ALPRAZolam Duanne Moron) 0.5 MG tablet [id number] Yes TAKE 1 TABLET (0.5 MG TOTAL) BY MOUTH 4 (FOUR) TIMES DAILY AS NEEDED FOR ANXIETY. [name], MD Taking Active   aspirin EC 81 MG tablet [id number] No Take 1  tablet (81 mg total) by mouth daily.  Patient not taking: Reported on 09/18/2020   End, Harrell Gave, MD Not Taking Active   baclofen (LIORESAL) 10 MG tablet [id number] Yes Take 10 mg by mouth 2 (two) times daily. [provider] Taking Active   Biotin 10 MG TABS [id number] Yes Take by mouth. [provider] Taking Active   Cholecalciferol (VITAMIN D3) 1000 units CAPS [id number] Yes Take by mouth daily.  [provider] Taking Active            Med Note ([facility], BRANDY L   Wed Jan 12, 2020  1:21 PM)    clobetasol ointment (TEMOVATE) 0.05 % [id number]  Apply topically. Vaginally [provider]  Active            Med Note Mayo Ao Sep 18, 2020  1:46 PM)    Continuous Blood Gluc Sensor (FREESTYLE LIBRE 14 [place]) [place] [id number] Yes SMARTSIG:1 Each Topical Every 2 Weeks [provider] Taking Active   Cyanocobalamin [place] [id number] Yes Take by mouth. Takes occassionally [provider] Taking Active   dicyclomine (BENTYL) 10 MG capsule [id number] Yes Take 1 capsule (10 mg total) by mouth 3 (three) times daily as needed for spasms. Jackolyn Confer, MD Taking Active   Emollient [facility]) CREA [id number] Yes Apply topically daily.  [provider] Taking Active   Evolocumab (REPATHA SURECLICK) [id number] MG/ML SOAJ [id number] No Inject 140 mg into the skin every 14 (fourteen) days.  Patient not taking: Reported on 09/18/2020   [name], MD Not Taking Active   famotidine (PEPCID) 20 MG tablet [id number] Yes Take 1 tablet (20 mg total) by mouth at bedtime for 14 days.  Patient taking differently: Take 20 mg by mouth daily. LRN   Jodelle Green, FNP Taking Expired 06/14/20 2359   ibuprofen (ADVIL) 200 MG tablet [id number] Yes Take 200 mg by mouth every 6 (six) hours as needed. [provider] Taking Active   insulin aspart (NOVOLOG) 100 UNIT/ML FlexPen [id number] Yes Sliding scale up to three times  daily with meals [provider] Taking Active   ketoconazole (NIZORAL) 2 % cream [id number] Yes Apply 1 application topically 2 (two) times daily. [provider] Taking Active   ketoconazole (NIZORAL) 2 % shampoo [id number] Yes Apply 1 application topically once a week. [provider] Taking Active   losartan (COZAAR) 25 MG tablet [id number] No Take 0.5 tablets (12.5 mg total) by mouth daily.  Patient not taking: No sig reported   End, Christopher, MD Not Taking Active   montelukast (SINGULAIR) 10 MG tablet [id number] Yes Take 1 tablet (10 mg total) by mouth at bedtime. [name], MD Taking Active  Med Note Mayo Ao Sep 18, 2020  1:49 PM) Using PRN  ondansetron (ZOFRAN) 4 MG tablet [id number] No TAKE 1 TABLET BY MOUTH EVERY 8 HOURS AS NEEDED FOR NAUSEA AND VOMITING  Patient not taking: Reported on 09/18/2020   [name], MD Not Taking Active   Polyethyl Glycol-Propyl Glycol [facility] OP) [id number] Yes Apply to eye. [provider] Taking Active   pseudoephedrine-guaifenesin (MUCINEX D) 60-600 MG 12 hr tablet [id number] Yes Take 1 tablet by mouth 2 (two) times daily as needed.  [provider] Taking Active   Tiotropium Bromide Monohydrate (SPIRIVA RESPIMAT) 1.25 MCG/ACT AERS [id number] No Inhale 2 puffs into the lungs daily.  Patient not taking: Reported on 09/18/2020   Flora Lipps, MD Not Taking Active   traMADol (ULTRAM) 50 MG tablet [id number] Yes TAKE 1 TABLET BY MOUTH EVERY 6 HOURS AS NEEDED. FOR PAIN [name], MD Taking Active   traZODone (DESYREL) 50 MG tablet [id number] Yes TAKE 0.5-1 TABLETS BY MOUTH AT BEDTIME AS NEEDED FOR SLEEP. [name], MD Taking Active   TRESIBA FLEXTOUCH 100 UNIT/ML SOPN FlexTouch Pen [id number] Yes 20 Units at bedtime. [provider] Taking Active            Med Note Mayo Ao Sep 18, 2020  1:47 PM) 6 units QPM  triamcinolone  (NASACORT) 55 MCG/ACT AERO nasal inhaler [id number] Yes Place 2 sprays into the nose daily. [provider] Taking Active           Patient Active Problem List   Diagnosis Date Noted  . Elevated LFTs 08/04/2020  . Chronic heart failure with preserved ejection fraction (HFpEF) ([place]) 06/15/2020  . Elevated aldolase level 04/28/2020  . Tick bite of back 02/14/2020  . Non-ischemic cardiomyopathy ([place]) 01/13/2020  . History of chest pain 10/20/2019  . Former smoker 08/30/2019  . Restrictive lung disease 08/30/2019  . Foot pain 02/16/2019  . Hair loss 08/31/2018  . Bilateral leg cramps 05/27/2018  . Memory difficulty 04/27/2018  . Iliotibial band syndrome 04/27/2018  . Respiratory illness 04/27/2018  . Allergic rhinitis 01/26/2018  . RUQ pain 10/28/2017  . Obesity (BMI 30.0-34.9) 10/28/2017  . Pain in limb 06/03/2017  . Chest pain 01/17/2017  . Throat fullness 10/15/2016  . Primary biliary cirrhosis ([place]) 10/15/2016  . Rash 08/08/2016  . Lower abdominal pain 08/08/2016  . Cephalalgia 01/26/2016  . Dysuria 12/20/2015  . Vitamin D deficiency 12/20/2015  . Sjogren's syndrome ([place]) 12/20/2015  . Chronic fatigue 12/04/2015  . Insomnia 10/19/2015  . Chronic bilateral low back pain without sciatica 11/07/2014  . OSA on CPAP 10/19/2014  . Anxiety and depression 08/16/2014  . Allergic urticaria 06/02/2014  . Dyspnea 05/04/2014  . Diabetes type 2, uncontrolled ([place]) 07/06/2013  . Malignant neoplasm of breast (female) ([place]) 02/28/2013  . Blood clotting disorder ([place]) 12/01/2012  . Palpitations 12/01/2012  . Hyperlipidemia 12/01/2012  . Chronic pain disorder 06/11/2012  . Hearing loss 09/16/2011  . Fibromyalgia 06/15/2011    Conditions to be addressed/monitored: HTN, HLD and DMII  Care Plan : Medication Management  Updates made by De Hollingshead, RPH-CPP since 10/26/2020 12:00 AM    Problem: Hyperlipidemia, Diabetes, COPD     Long-Range Goal: Disease Progression  Prevention   This Visit's Progress: On track  Recent Progress: On track  Priority: High  Note:   Current Barriers:  . Unable to independently afford treatment regimen . Unable to achieve control of  cholesterol   Pharmacist Clinical Goal(s):  Marland Mccoy Over the next 90 days, patient will verbalize ability to afford treatment regimen . Over the next 90 days, patient will achieve control of cholesterol through collaboration with PharmD and provider.   Interventions: . 1:1 collaboration with [name], MD regarding development and update of comprehensive plan of care as evidenced by provider attestation and co-signature . Inter-disciplinary care team collaboration (see longitudinal plan of care) . Comprehensive medication review performed; medication list updated in electronic medical record  Diabetes: . Uncontrolled; current treatment: Tresiba 6 units PRN - per endocrinology, if fasting BG >130, Novolog 2-4 units TID per sliding scale; follows w/ Malissa Hippo at [facility]  o Hx intolerance to: metformin (GI upset), Invokana (yeast infections), Jardiance (pancreatitis), Trulicity (abdominal pain), glipizide XR (hypoglycemia) . Approved for patient assistance for [employer]. Pending shipment at this time.  . Reports increased blood sugar readings since starting Repatha. See below.  . Continue current regimen per endocrinology at this time.   Hyperlipidemia and ASCVD risk reduction: . Uncontrolled; current treatment: prescribed Repatha, but called earlier this week to report abdominal distention, headache, higher blood sugars, and more skin peeling on her face in the 2 days after previous two Repatha dose. Advised to hold therapy.  . Antiplatelet regimen: aspirin 81 mg daily . Medications previously tried: rosuvastatin - liver enzyme elevations with therapy.  . Agree to hold Repatha and follow. If symptoms resolve, discontinue Repatha and discuss alternative therapy. If symptoms  recur, unlikely related to [place].  . Discussed future options. Discussed option of bempedoic acid (as Nexletol or with ezetimibe as Nexlizet) vs referral to lipid clinic specialist. However, Nexletol does not appear to be listed in the formulary for patient's insurance plan found online. Additionally, current approval for this medication is in secondary prevention, and coverage would depend on plan definition for ASCVD.  Marland Mccoy Continue to hold Repatha at this time. PCP f/u later this month as scheduled.   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 Uncontrolled; current treatment: Spiriva, but not using frequently. Albuterol HFA PRN, using very infrequently. Montelukast 10 mg daily, triamcinolone nasal daily PRN. Follows w/ Dr. Mortimer Fries  . 1 exacerbation in the last 6 months . Consider Spiriva vs Symbicort with patient and respiratory team moving forward. Patient over income for Spiriva assistance, but would qualify for Symbicort assistance. ICS component may be expected to provide benefit in restrictive lung disease. Continue to follow.   Anxiety: . Uncontrolled; current regimen: alprazolam 0.5 mg up to QID PRN anxiety, notes she is taking TID o Previously tried: escitalopram (noted rash) . Will further discuss treatment and safety moving forward. Consider alternative SSRI moving forward.   Pain: . Patient notes controlled; current regimen: tramadol 50 mg Q6H PRN, not needing every day. Baclofen 10 mg BID PRN.  Marland Mccoy Recommend to continue current regimen at this time  Gastrointestinal Concerns: . Current regimen: dicyclomine 10 mg TID PRN, famotidine 20 mg daily PRN . Recommend to current regimen at this time  Patient Goals/Self-Care Activities . Over the next 90 days, patient will:  - take medications as prescribed check glucose TID using CGM, document, and provide at future appointments collaborate with provider on medication access solutions  Follow Up Plan: Telephone follow up appointment with  care management team member scheduled for: ~ 4 weeks      Medication Assistance: Mirian Mo obtained through [employer] medication assistance program.  Enrollment ends 08/29/21  Follow Up:  Patient agrees to  Care Plan and Follow-up.  Plan: Telephone follow up appointment with care management team member scheduled for:  ~ 4 weeks  Catie Darnelle Maffucci, PharmD, [place], [place] Clinical Pharmacist [employer] at [employer] [phone]

## 2020-10-27 ENCOUNTER — Other Ambulatory Visit: Payer: Self-pay

## 2020-10-31 ENCOUNTER — Ambulatory Visit (INDEPENDENT_AMBULATORY_CARE_PROVIDER_SITE_OTHER): Payer: PPO | Admitting: Family Medicine

## 2020-10-31 ENCOUNTER — Other Ambulatory Visit: Payer: Self-pay

## 2020-10-31 VITALS — BP 118/70 | HR 78 | Temp 97.5°F | Ht 64.0 in | Wt 151.8 lb

## 2020-10-31 DIAGNOSIS — R1011 Right upper quadrant pain: Secondary | ICD-10-CM

## 2020-10-31 DIAGNOSIS — R252 Cramp and spasm: Secondary | ICD-10-CM | POA: Diagnosis not present

## 2020-10-31 DIAGNOSIS — R1084 Generalized abdominal pain: Secondary | ICD-10-CM | POA: Diagnosis not present

## 2020-10-31 DIAGNOSIS — M26609 Unspecified temporomandibular joint disorder, unspecified side: Secondary | ICD-10-CM

## 2020-10-31 DIAGNOSIS — R300 Dysuria: Secondary | ICD-10-CM | POA: Diagnosis not present

## 2020-10-31 DIAGNOSIS — E785 Hyperlipidemia, unspecified: Secondary | ICD-10-CM | POA: Diagnosis not present

## 2020-10-31 LAB — POCT URINALYSIS DIPSTICK
Bilirubin, UA: NEGATIVE
Glucose, UA: NEGATIVE
Ketones, UA: NEGATIVE
Nitrite, UA: NEGATIVE
Protein, UA: NEGATIVE
Spec Grav, UA: <=1.005 — AB (ref 1.010–1.025)
Urobilinogen, UA: 0.2 E.U./dL
pH, UA: 5 (ref 5.0–8.0)

## 2020-10-31 LAB — COMPREHENSIVE METABOLIC PANEL
ALT: 78 U/L — ABNORMAL HIGH (ref 0–35)
AST: 53 U/L — ABNORMAL HIGH (ref 0–37)
Albumin: 4.1 g/dL (ref 3.5–5.2)
Alkaline Phosphatase: 209 U/L — ABNORMAL HIGH (ref 39–117)
BUN: 15 mg/dL (ref 6–23)
CO2: 28 mEq/L (ref 19–32)
Calcium: 9.2 mg/dL (ref 8.4–10.5)
Chloride: 99 mEq/L (ref 96–112)
Creatinine, Ser: 0.75 mg/dL (ref 0.40–1.20)
GFR: 85.1 mL/min (ref >60.00)
Glucose, Bld: 163 mg/dL — ABNORMAL HIGH (ref 70–99)
Potassium: 4.1 mEq/L (ref 3.5–5.1)
Sodium: 134 mEq/L — ABNORMAL LOW (ref 135–145)
Total Bilirubin: 0.3 mg/dL (ref 0.2–1.2)
Total Protein: 7.5 g/dL (ref 6.0–8.3)

## 2020-10-31 LAB — URINALYSIS, MICROSCOPIC ONLY: RBC / HPF: NONE SEEN (ref 0–?)

## 2020-10-31 LAB — CBC WITH DIFFERENTIAL/PLATELET
Basophils Absolute: 0.1 10*3/uL (ref 0.0–0.1)
Basophils Relative: 1.2 % (ref 0.0–3.0)
Eosinophils Absolute: 0.3 10*3/uL (ref 0.0–0.7)
Eosinophils Relative: 5.2 % — ABNORMAL HIGH (ref 0.0–5.0)
HCT: 42.2 % (ref 36.0–46.0)
Hemoglobin: 14.4 g/dL (ref 12.0–15.0)
Lymphocytes Relative: 25.2 % (ref 12.0–46.0)
Lymphs Abs: 1.5 10*3/uL (ref 0.7–4.0)
MCHC: 34.1 g/dL (ref 30.0–36.0)
MCV: 91.4 fl (ref 78.0–100.0)
Monocytes Absolute: 0.8 10*3/uL (ref 0.1–1.0)
Monocytes Relative: 13 % — ABNORMAL HIGH (ref 3.0–12.0)
Neutro Abs: 3.2 10*3/uL (ref 1.4–7.7)
Neutrophils Relative %: 55.4 % (ref 43.0–77.0)
Platelets: 179 10*3/uL (ref 150.0–400.0)
RBC: 4.62 Mil/uL (ref 3.87–5.11)
RDW: 14.8 % (ref 11.5–15.5)
WBC: 5.8 10*3/uL (ref 4.0–10.5)

## 2020-10-31 LAB — LIPASE: Lipase: 78 U/L — ABNORMAL HIGH (ref 11.0–59.0)

## 2020-10-31 NOTE — Patient Instructions (Signed)
Nice to see you. We will check labs in your urine today. Please try ibuprofen 600 mg every 8 hours with food for the next 3 days.  If that is not beneficial please see your dentist for your TMJ symptoms.

## 2020-10-31 NOTE — Progress Notes (Signed)
[name], MD Phone: [phone]  Kristina Mccoy is a 65 y.o. female who presents today for f/u.  Leg spasms: This has been going on intermittently for a long time.  She gets cramps in her calves.  She notes her toes locked up in her left foot yesterday.  She previously took baclofen with no benefit.  She has been drinking 3-4 bottles of water daily.  Left ear pain: Patient notes there is a sharp shooting pain in her left ear.  Notes intermittent.  No tinnitus.  No ear fullness.  Does note some clicking in her TMJ.  Dysuria: Patient notes some dysuria, urinary frequency, and urinary urgency.  She notes some lower abdominal pain and pressure with a throbbing sensation.  This is been going on all about a week.  No hematuria.  She does have chronic vaginal discharge that is unchanged.  She does have chronic low back pain though notes this got worse with the urine symptoms. She does have chronic abdominal pain and tenderness.   Hyperlipidemia: The patient was not able to tolerate the Repatha due to side effects.  She has been intolerant of all treatments for her cholesterol.  Social History   Tobacco Use  Smoking Status Former Smoker  . Packs/day: 1.00  . Years: 30.00  . Pack years: 30.00  . Types: Cigarettes  . Quit date: 02/28/2013  . Years since quitting: 7.6  Smokeless Tobacco Never Used    Current Outpatient Medications on File Prior to Visit  Medication Sig Dispense Refill  . albuterol (VENTOLIN HFA) 108 (90 Base) MCG/ACT inhaler Inhale 2 puffs into the lungs every 4 (four) hours as needed for wheezing or shortness of breath. 18 g 0  . ALPRAZolam (XANAX) 0.5 MG tablet TAKE 1 TABLET (0.5 MG TOTAL) BY MOUTH 4 (FOUR) TIMES DAILY AS NEEDED FOR ANXIETY. 120 tablet 0  . aspirin EC 81 MG tablet Take 1 tablet (81 mg total) by mouth daily. 90 tablet 3  . baclofen (LIORESAL) 10 MG tablet Take 10 mg by mouth 2 (two) times daily.    . Biotin 10 MG TABS Take by mouth.    .  Cholecalciferol (VITAMIN D3) 1000 units CAPS Take by mouth daily.     . clobetasol ointment (TEMOVATE) 0.05 % Apply topically. Vaginally    . Continuous Blood Gluc Sensor (FREESTYLE LIBRE 14 DAY SENSOR) MISC SMARTSIG:1 Each Topical Every 2 Weeks    . Cyanocobalamin 1000 MCG CAPS Take by mouth. Takes occassionally    . dicyclomine (BENTYL) 10 MG capsule Take 1 capsule (10 mg total) by mouth 3 (three) times daily as needed for spasms. 60 capsule 1  . Emollient (CERAVE) CREA Apply topically daily.     Marland Kitchen ibuprofen (ADVIL) 200 MG tablet Take 200 mg by mouth every 6 (six) hours as needed.    . insulin aspart (NOVOLOG) 100 UNIT/ML FlexPen Sliding scale up to three times daily with meals    . ketoconazole (NIZORAL) 2 % cream Apply 1 application topically 2 (two) times daily.  3  . ketoconazole (NIZORAL) 2 % shampoo Apply 1 application topically once a week.  11  . montelukast (SINGULAIR) 10 MG tablet Take 1 tablet (10 mg total) by mouth at bedtime. 30 tablet 3  . ondansetron (ZOFRAN) 4 MG tablet TAKE 1 TABLET BY MOUTH EVERY 8 HOURS AS NEEDED FOR NAUSEA AND VOMITING 18 tablet 1  . Polyethyl Glycol-Propyl Glycol (SYSTANE OP) Apply to eye.    . pseudoephedrine-guaifenesin ([place] D) 60-600  MG 12 hr tablet Take 1 tablet by mouth 2 (two) times daily as needed.     . Tiotropium Bromide Monohydrate (SPIRIVA RESPIMAT) 1.25 MCG/ACT AERS Inhale 2 puffs into the lungs daily. 1 g 3  . traMADol (ULTRAM) 50 MG tablet TAKE 1 TABLET BY MOUTH EVERY 6 HOURS AS NEEDED FOR PAIN 120 tablet 0  . TRESIBA FLEXTOUCH 100 UNIT/ML SOPN FlexTouch Pen 20 Units at bedtime.  5  . triamcinolone (NASACORT) 55 MCG/ACT AERO nasal inhaler Place 2 sprays into the nose daily.    . Evolocumab (REPATHA SURECLICK) 646 MG/ML SOAJ Inject 140 mg into the skin every 14 (fourteen) days. (Patient not taking: Reported on 10/31/2020) 6 mL 1  . famotidine (PEPCID) 20 MG tablet Take 1 tablet (20 mg total) by mouth at bedtime for 14 days. (Patient taking  differently: Take 20 mg by mouth daily. LRN) 14 tablet 0  . traZODone (DESYREL) 50 MG tablet TAKE 0.5-1 TABLETS BY MOUTH AT BEDTIME AS NEEDED FOR SLEEP. (Patient not taking: Reported on 10/31/2020) 90 tablet 2   No current facility-administered medications on file prior to visit.     ROS see history of present illness  Objective  Physical Exam Vitals:   10/31/20 1105  BP: 118/70  Pulse: 78  Temp: (!) 97.5 F (36.4 C)    BP Readings from Last 3 Encounters:  10/31/20 118/70  08/18/20 122/68  07/31/20 118/70   Wt Readings from Last 3 Encounters:  10/31/20 151 lb 12.8 oz (68.9 kg)  08/28/20 151 lb (68.5 kg)  08/18/20 151 lb 12.8 oz (68.9 kg)    Physical Exam Constitutional:      General: She is not in acute distress.    Appearance: She is not diaphoretic.  HENT:     Head:     Comments: Tenderness of the left TMJ    Right Ear: Tympanic membrane and ear canal normal.     Left Ear: Tympanic membrane and ear canal normal.  Cardiovascular:     Rate and Rhythm: Normal rate and regular rhythm.     Heart sounds: Normal heart sounds.  Pulmonary:     Effort: Pulmonary effort is normal.     Breath sounds: Normal breath sounds.  Abdominal:     General: Bowel sounds are normal. There is no distension.     Palpations: Abdomen is soft.     Tenderness: There is abdominal tenderness (Mild tenderness throughout her abdomen). There is no guarding or rebound.  Musculoskeletal:        General: No edema.     Right lower leg: No edema.     Left lower leg: No edema.  Skin:    General: Skin is warm and dry.  Neurological:     Mental Status: She is alert.    Diabetic Foot Exam - Simple   Simple Foot Form Diabetic Foot exam was performed with the following findings: Yes 10/31/2020 11:30 AM  Visual Inspection No deformities, no ulcerations, no other skin breakdown bilaterally: Yes Sensation Testing See comments: Yes Pulse Check Posterior Tibialis and Dorsalis pulse intact bilaterally:  Yes Comments Decreased monofilament sensation throughout, intact to light touch throughout      Assessment/Plan: Please see individual problem list.  Problem List Items Addressed This Visit    Bilateral leg cramps    Chronic ongoing issue.  Encouraged adequate hydration.  Prior medication management was not beneficial.  We'll check electrolytes to evaluate for potential causes.      Dysuria - Primary  Concern for UTI.  This could account for her lower abdominal discomfort and some of her worsened back pain.  We will send urine for culture microscopy and then determine treatment.      Relevant Orders   POCT Urinalysis Dipstick (Completed)   Urine Culture   Urine Microscopic (Completed)   Hyperlipidemia    Intolerant of statins, Zetia, and Repatha.  At this time we will let her remain off of medication given side effect issues.      RUQ pain    Chronic issues with abdominal discomfort.  We'll check lab work to evaluate for other potential causes.  She'll continue to see GI.      TMJ dysfunction    I suspect this is the cause of her "left ear pain."  Discussed a trial of ibuprofen 600 mg every 8 hours on a schedule for 3 days.  If that does not resolve this she will need to see her dentist to consider a splint.       Other Visit Diagnoses    Generalized abdominal pain       Relevant Orders   Comp Met (CMET) (Completed)   CBC w/Diff (Completed)   Lipase (Completed)      This visit occurred during the SARS-CoV-2 public health emergency.  Safety protocols were in place, including screening questions prior to the visit, additional usage of staff PPE, and extensive cleaning of exam room while observing appropriate contact time as indicated for disinfecting solutions.    [name], MD [place]

## 2020-11-01 DIAGNOSIS — M26609 Unspecified temporomandibular joint disorder, unspecified side: Secondary | ICD-10-CM | POA: Insufficient documentation

## 2020-11-01 LAB — URINE CULTURE
MICRO NUMBER:: 11481419
Result:: NO GROWTH
SPECIMEN QUALITY:: ADEQUATE

## 2020-11-01 NOTE — Assessment & Plan Note (Signed)
Concern for UTI.  This could account for her lower abdominal discomfort and some of her worsened back pain.  We will send urine for culture microscopy and then determine treatment.

## 2020-11-01 NOTE — Assessment & Plan Note (Signed)
I suspect this is the cause of her "left ear pain."  Discussed a trial of ibuprofen 600 mg every 8 hours on a schedule for 3 days.  If that does not resolve this she will need to see her dentist to consider a splint.

## 2020-11-01 NOTE — Assessment & Plan Note (Signed)
Intolerant of statins, Zetia, and Repatha.  At this time we will let her remain off of medication given side effect issues.

## 2020-11-01 NOTE — Assessment & Plan Note (Signed)
Chronic ongoing issue.  Encouraged adequate hydration.  Prior medication management was not beneficial.  We'll check electrolytes to evaluate for potential causes.

## 2020-11-01 NOTE — Assessment & Plan Note (Signed)
Chronic issues with abdominal discomfort.  We'll check lab work to evaluate for other potential causes.  She'll continue to see GI.

## 2020-11-02 ENCOUNTER — Encounter: Payer: Self-pay | Admitting: Family Medicine

## 2020-11-02 DIAGNOSIS — G8929 Other chronic pain: Secondary | ICD-10-CM

## 2020-11-02 DIAGNOSIS — R1084 Generalized abdominal pain: Secondary | ICD-10-CM

## 2020-11-02 DIAGNOSIS — M545 Low back pain, unspecified: Secondary | ICD-10-CM

## 2020-11-07 DIAGNOSIS — Z794 Long term (current) use of insulin: Secondary | ICD-10-CM | POA: Diagnosis not present

## 2020-11-07 DIAGNOSIS — E1165 Type 2 diabetes mellitus with hyperglycemia: Secondary | ICD-10-CM | POA: Diagnosis not present

## 2020-11-08 ENCOUNTER — Other Ambulatory Visit: Payer: Self-pay | Admitting: Internal Medicine

## 2020-11-08 DIAGNOSIS — R0602 Shortness of breath: Secondary | ICD-10-CM

## 2020-11-08 DIAGNOSIS — R748 Abnormal levels of other serum enzymes: Secondary | ICD-10-CM | POA: Diagnosis not present

## 2020-11-10 ENCOUNTER — Ambulatory Visit
Admission: RE | Admit: 2020-11-10 | Discharge: 2020-11-10 | Disposition: A | Discharge status: Home / Self Care | Payer: PPO | Source: Ambulatory Visit | Attending: Family Medicine | Admitting: Family Medicine

## 2020-11-10 ENCOUNTER — Other Ambulatory Visit: Payer: Self-pay

## 2020-11-10 DIAGNOSIS — R1084 Generalized abdominal pain: Secondary | ICD-10-CM | POA: Diagnosis not present

## 2020-11-13 ENCOUNTER — Telehealth: Payer: Self-pay | Admitting: Family Medicine

## 2020-11-13 NOTE — Telephone Encounter (Signed)
Patient was wondering if pen needles would be coming with the [employer] PAP supply of [place] and Novolog. Informed her that needles would be.   Reviewed that per CPhT, order was requested 10/24/20 and can take 21-24 business days to arrive to our office. Patient verbalized understanding

## 2020-11-13 NOTE — Telephone Encounter (Signed)
An her insulin order and some pen needles. Please call her at [phone]. See the beginning of the note at bottom.

## 2020-11-13 NOTE — Telephone Encounter (Signed)
Patient would like to speak to Catie about her

## 2020-11-14 ENCOUNTER — Telehealth: Payer: Self-pay

## 2020-11-14 ENCOUNTER — Other Ambulatory Visit: Payer: Self-pay | Admitting: Gastroenterology

## 2020-11-14 ENCOUNTER — Other Ambulatory Visit (HOSPITAL_COMMUNITY): Payer: Self-pay | Admitting: Gastroenterology

## 2020-11-14 DIAGNOSIS — K743 Primary biliary cirrhosis: Secondary | ICD-10-CM

## 2020-11-14 NOTE — Telephone Encounter (Signed)
I called the patient and informed her that her medication from patients assistance has arrived, [place], and novolog and needles and she can pick up at her convience.  She understood.   Rana Hochstein,cma

## 2020-11-16 ENCOUNTER — Other Ambulatory Visit: Payer: Self-pay | Admitting: Family Medicine

## 2020-11-20 ENCOUNTER — Ambulatory Visit
Admission: RE | Admit: 2020-11-20 | Discharge: 2020-11-20 | Disposition: A | Discharge status: Home / Self Care | Payer: PPO | Source: Ambulatory Visit | Attending: Gastroenterology | Admitting: Gastroenterology

## 2020-11-20 ENCOUNTER — Other Ambulatory Visit: Payer: Self-pay

## 2020-11-20 DIAGNOSIS — K745 Biliary cirrhosis, unspecified: Secondary | ICD-10-CM | POA: Diagnosis not present

## 2020-11-20 DIAGNOSIS — K743 Primary biliary cirrhosis: Secondary | ICD-10-CM

## 2020-11-21 ENCOUNTER — Ambulatory Visit: Payer: PPO

## 2020-11-21 ENCOUNTER — Encounter: Payer: Self-pay | Admitting: *Deleted

## 2020-11-22 NOTE — Telephone Encounter (Signed)
Patient called in after receiving message for [place] results to call office, stated Dr. Haig Prophet put her on Actigall and this seems to be helping, patient ask if 3 month follow up ok or does she need to be seen sooner, she is feeling much better.

## 2020-11-22 NOTE — Telephone Encounter (Signed)
It is ok to keep 3 month follow-up if she is feeling better.

## 2020-11-24 DIAGNOSIS — C50112 Malignant neoplasm of central portion of left female breast: Secondary | ICD-10-CM | POA: Diagnosis not present

## 2020-11-24 DIAGNOSIS — Z9013 Acquired absence of bilateral breasts and nipples: Secondary | ICD-10-CM | POA: Diagnosis not present

## 2020-11-24 DIAGNOSIS — C50111 Malignant neoplasm of central portion of right female breast: Secondary | ICD-10-CM | POA: Diagnosis not present

## 2020-11-27 ENCOUNTER — Telehealth: Payer: PPO

## 2020-11-27 ENCOUNTER — Encounter: Payer: Self-pay | Admitting: Pharmacist

## 2020-12-03 ENCOUNTER — Other Ambulatory Visit: Payer: Self-pay | Admitting: Family Medicine

## 2020-12-03 DIAGNOSIS — J309 Allergic rhinitis, unspecified: Secondary | ICD-10-CM

## 2020-12-04 NOTE — Telephone Encounter (Signed)
Dr. Kasa, please advise. thanks 

## 2020-12-05 ENCOUNTER — Other Ambulatory Visit: Payer: Self-pay

## 2020-12-05 MED ORDER — AZITHROMYCIN 250 MG PO TABS: ORAL_TABLET | ORAL | 0 refills | Status: AC

## 2020-12-05 MED ORDER — BREO ELLIPTA 200-25 MCG/INH IN AEPB: 1.0000 | INHALATION_SPRAY | Freq: Every day | RESPIRATORY_TRACT | 5 refills | Status: AC

## 2020-12-05 NOTE — Telephone Encounter (Signed)
Dr. Patsey Berthold, please advise on zpak. Dr. Mortimer Fries is unavailable.   Routing to Dr. Mortimer Fries as an Juluis Rainier and to advise on daily inhaler.

## 2020-12-05 NOTE — Telephone Encounter (Signed)
Okay for Z-Pak.  Reviewing her records it looks like previously she had done well on Breo Ellipta.  If this is covered by her insurance we can try this again.  She needs a follow-up appointment with either Dr. Mortimer Fries or nurse practitioner in the next few weeks.

## 2020-12-07 ENCOUNTER — Ambulatory Visit (INDEPENDENT_AMBULATORY_CARE_PROVIDER_SITE_OTHER): Payer: PPO | Admitting: Pharmacist

## 2020-12-07 DIAGNOSIS — E1165 Type 2 diabetes mellitus with hyperglycemia: Secondary | ICD-10-CM | POA: Diagnosis not present

## 2020-12-07 DIAGNOSIS — F419 Anxiety disorder, unspecified: Secondary | ICD-10-CM

## 2020-12-07 DIAGNOSIS — F32A Depression, unspecified: Secondary | ICD-10-CM

## 2020-12-07 DIAGNOSIS — M545 Low back pain, unspecified: Secondary | ICD-10-CM

## 2020-12-07 DIAGNOSIS — E785 Hyperlipidemia, unspecified: Secondary | ICD-10-CM | POA: Diagnosis not present

## 2020-12-07 DIAGNOSIS — I428 Other cardiomyopathies: Secondary | ICD-10-CM

## 2020-12-07 DIAGNOSIS — G894 Chronic pain syndrome: Secondary | ICD-10-CM

## 2020-12-07 DIAGNOSIS — G8929 Other chronic pain: Secondary | ICD-10-CM

## 2020-12-07 DIAGNOSIS — K743 Primary biliary cirrhosis: Secondary | ICD-10-CM

## 2020-12-07 NOTE — Patient Instructions (Signed)
Visit Information  PATIENT GOALS: Goals Addressed              This Visit's Progress     Patient Stated   .  Medication Monitoring (pt-stated)        Patient Goals/Self-Care Activities . Over the next 90 days, patient will:  - take medications as prescribed check glucose TID using CGM, document, and provide at future appointments collaborate with provider on medication access solutions          Patient verbalizes understanding of instructions provided today and agrees to view in [place].   Plan: Telephone follow up appointment with care management team member scheduled for:  ~ 12 weeks  Catie Darnelle Maffucci, PharmD, [place], [place] Clinical Pharmacist [employer] at [employer] [phone]

## 2020-12-07 NOTE — Progress Notes (Signed)
Chronic Care Management Pharmacy Note  12/07/2020 Name:  Kristina Mccoy MRN:  [id number] DOB:  1956-02-16  Subjective: Kristina Mccoy is an 65 y.o. year old female who is a primary patient of Caryl Bis, Angela Adam, MD.  The CCM team was consulted for assistance with disease management and care coordination needs.    Engaged with patient by telephone for follow up visit in response to provider referral for pharmacy case management and/or care coordination services.   Consent to Services:  The patient was given information about Chronic Care Management services, agreed to services, and gave verbal consent prior to initiation of services.  Please see initial visit note for detailed documentation.   Patient Care Team: [name], MD as PCP - General (Family Medicine) End, Harrell Gave, MD as PCP - Cardiology (Cardiology) De Hollingshead, RPH-CPP (Pharmacist)  Recent office visits:  2/1 - PCP visit for dysuria. Discontinue Repatha.   Recent consult visits:  None since our last call  Hospital visits: None in previous 6 months  Objective:  Lab Results  Component Value Date   CREATININE 0.75 10/31/2020   BUN 15 10/31/2020   GFR 85.10 10/31/2020   GFRNONAA >60 07/03/2020   GFRAA >60 07/03/2020   NA 134 (L) 10/31/2020   K 4.1 10/31/2020   CALCIUM 9.2 10/31/2020   CO2 28 10/31/2020    Lab Results  Component Value Date/Time   HGBA1C 8.0 (H) 02/15/2020 02:28 PM   HGBA1C 9.4 (H) 04/27/2018 02:19 PM   HGBA1C 9.5 (H) 09/01/2014 03:56 AM   GFR 85.10 10/31/2020 11:36 AM   GFR 73.67 05/08/2020 03:28 PM   MICROALBUR <0.7 02/15/2020 02:28 PM   MICROALBUR <0.7 12/22/2017 03:17 PM    Last diabetic Eye exam:  Lab Results  Component Value Date/Time   HMDIABEYEEXA No Retinopathy 04/24/2020 12:00 AM    Last diabetic Foot exam:  Lab Results  Component Value Date/Time   HMDIABFOOTEX Normal 05/04/2014 12:00 AM     Lab Results  Component Value Date   CHOL 289 (H)  04/28/2020   HDL 54 04/28/2020   LDLCALC 200 (H) 04/28/2020   LDLDIRECT 153.0 06/19/2020   TRIG 177 (H) 04/28/2020   CHOLHDL 5.4 (H) 04/28/2020    Hepatic Function Latest Ref Rng & Units 10/31/2020 07/25/2020 07/03/2020  Total Protein 6.0 - 8.3 g/dL 7.5 6.9 7.9  Albumin 3.5 - 5.2 g/dL 4.1 3.8 3.6  AST 0 - 37 U/L 53(H) 35 44(H)  ALT 0 - 35 U/L 78(H) 41(H) 62(H)  Alk Phosphatase 39 - 117 U/L 209(H) 154(H) 191(H)  Total Bilirubin 0.2 - 1.2 mg/dL 0.3 0.3 0.5  Bilirubin, Direct 0.0 - 0.3 mg/dL - 0.1 0.1    Lab Results  Component Value Date/Time   TSH 0.57 02/15/2020 02:28 PM   TSH 1.32 08/31/2018 11:41 AM   FREET4 0.86 05/27/2012 03:59 PM    CBC Latest Ref Rng & Units 10/31/2020 02/19/2019 10/02/2018  WBC 4.0 - 10.5 K/uL 5.8 10.8 6.8  Hemoglobin 12.0 - 15.0 g/dL 14.4 13.5 14.4  Hematocrit 36.0 - 46.0 % 42.2 40.4 41.9  Platelets 150.0 - 400.0 K/uL 179.0 278 219    Lab Results  Component Value Date/Time   VD25OH 29 (L) 04/28/2020 02:46 PM   VD25OH 29.69 (L) 02/15/2020 02:28 PM   VD25OH 43.56 04/19/2016 10:49 AM    Clinical ASCVD: No  The 10-year ASCVD risk score Mikey Bussing DC Jr., et al., 2013) is: 8.9%   Values used to calculate the score:  Age: 37 years     Sex: Female     Is Non-Hispanic African American: No     Diabetic: Yes     Tobacco smoker: No     Systolic Blood Pressure: 462 mmHg     Is BP treated: No     HDL Cholesterol: 54 mg/dL     Total Cholesterol: 289 mg/dL    Depression screen [facility] 2/9 10/31/2020 07/31/2020 04/28/2020  Decreased Interest 0 0 0  Down, Depressed, Hopeless 0 0 0  PHQ - 2 Score 0 0 0  Altered sleeping - - -  Tired, decreased energy - - -  Change in appetite - - -  Feeling bad or failure about yourself  - - -  Trouble concentrating - - -  Moving slowly or fidgety/restless - - -  Suicidal thoughts - - -  PHQ-9 Score - - -  Difficult doing work/chores - - -  Some recent data might be hidden       Social History   Tobacco Use  Smoking Status  Former Smoker  . Packs/day: 1.00  . Years: 30.00  . Pack years: 30.00  . Types: Cigarettes  . Quit date: 02/28/2013  . Years since quitting: 7.7  Smokeless Tobacco Never Used   BP Readings from Last 3 Encounters:  10/31/20 118/70  08/18/20 122/68  07/31/20 118/70   Pulse Readings from Last 3 Encounters:  10/31/20 78  08/18/20 72  07/31/20 90   Wt Readings from Last 3 Encounters:  10/31/20 151 lb 12.8 oz (68.9 kg)  08/28/20 151 lb (68.5 kg)  08/18/20 151 lb 12.8 oz (68.9 kg)    Assessment/Interventions: Review of patient past medical history, allergies, medications, health status, including review of consultants reports, laboratory and other test data, was performed as part of comprehensive evaluation and provision of chronic care management services.   SDOH:  (Social Determinants of Health) assessments and interventions performed: Yes SDOH Interventions   Flowsheet Row Most Recent Value  SDOH Interventions   Financial Strain Interventions Other (Comment)  [manufacturer assistance]      CCM Care Plan  Allergies  Allergen Reactions  . Gabapentin Itching and Swelling  . Oxycodone Itching and Rash  . Repatha [Evolocumab] Other (See Comments)    abdominal distention, headache, higher blood sugars, facial skin peeling  . Carbamazepine Nausea And Vomiting and Other (See Comments)    Abdominal pain   . Doxycycline Other (See Comments)  . Dulaglutide     Other reaction(s): Other (See Comments) Severe stomach pain  . Insulin Glargine Other (See Comments)    BLOATING, MUSCLE/JOINT PAIN  . Levaquin [Levofloxacin]     Diarrhea and pain in calf  . Metformin     Other reaction(s): Other (See Comments) Cramping- abdominal pain  . Methylprednisolone     Reddened face, puffy face   . Morphine Other (See Comments)  . Pregabalin Swelling    Swelling in arms and legs  . Rosuvastatin Other (See Comments)    LFTs increased   . Venlafaxine Other (See Comments)  . Buprenorphine  Rash and Swelling  . Canagliflozin Other (See Comments) and Rash    YEAST INFECTIONS  . Escitalopram Rash  . Hydroxychloroquine Rash    Medications Reviewed Today    Reviewed by De Hollingshead, RPH-CPP (Pharmacist) on 12/07/20 at 1539  Med List Status: <None>  Medication Order Taking? Sig Documenting Provider Last Dose Status Informant  albuterol (VENTOLIN HFA) 108 (90 Base) MCG/ACT inhaler [id number] Yes Inhale  2 puffs into the lungs every 4 (four) hours as needed for wheezing or shortness of breath. Sharion Balloon, NP Taking Active   ALPRAZolam Duanne Moron) 0.5 MG tablet [id number] Yes TAKE 1 TABLET (0.5 MG TOTAL) BY MOUTH 4 (FOUR) TIMES DAILY AS NEEDED FOR ANXIETY. [name], MD Taking Active   aspirin EC 81 MG tablet [id number] Yes Take 1 tablet (81 mg total) by mouth daily. End, Harrell Gave, MD Taking Active   azithromycin (ZITHROMAX) 250 MG tablet [id number] No Take 2 tablets (500 mg) on  Day 1,  followed by 1 tablet (250 mg) once daily on Days 2 through 5.  Patient not taking: Reported on 12/07/2020   Tyler Pita, MD Not Taking Active   baclofen (LIORESAL) 10 MG tablet [id number] Yes Take 10 mg by mouth 2 (two) times daily. [provider] Taking Active   Biotin 10 MG TABS [id number] Yes Take by mouth. [provider] Taking Active   Cholecalciferol (VITAMIN D3) 1000 units CAPS [id number] Yes Take by mouth daily.  [provider] Taking Active            Med Note (NEWCOM[facility], BRANDY L   Wed Jan 12, 2020  1:21 PM)    clobetasol ointment (TEMOVATE) 0.05 % [id number] Yes Apply topically. Vaginally [provider] Taking Active            Med Note Mayo Ao Sep 18, 2020  1:46 PM)    Continuous Blood Gluc Sensor (FREESTYLE LIBRE [address] [place]) [place] [id number] Yes SMARTSIG:1 Each Topical Every 2 Weeks [provider] Taking Active   Cyanocobalamin [place] [id number] Yes Take by mouth. Takes occassionally [provider] Taking Active   dicyclomine (BENTYL) 10 MG capsule [id number] Yes Take 1 capsule (10 mg total) by mouth 3 (three) times daily as needed for spasms. Jackolyn Confer, MD Taking Active   Emollient [facility]) CREA [id number]  Apply topically daily.  [provider]  Active   famotidine (PEPCID) 20 MG tablet [id number] Yes Take 1 tablet (20 mg total) by mouth at bedtime for 14 days.  Patient taking differently: Take 20 mg by mouth daily. LRN   Jodelle Green, FNP Taking Expired 06/14/20 2359   fluticasone furoate-vilanterol (BREO ELLIPTA) 200-25 MCG/INH AEPB [id number] No Inhale 1 puff into the lungs daily.  Patient not taking: Reported on 12/07/2020   Tyler Pita, MD Not Taking Active   ibuprofen (ADVIL) 200 MG tablet [id number]  Take 200 mg by mouth every 6 (six) hours as needed. [provider]  Active   insulin aspart (NOVOLOG) 100 UNIT/ML FlexPen [id number] Yes Sliding scale up to three times daily with meals [provider] Taking Active   ketoconazole (NIZORAL) 2 % cream [id number]  Apply 1 application topically 2 (two) times daily. [provider]  Active   ketoconazole (NIZORAL) 2 % shampoo [id number]  Apply 1 application topically once a week. [provider]  Active   montelukast (SINGULAIR) 10 MG tablet [id number] Yes TAKE 1 TABLET BY MOUTH EVERYDAY AT BEDTIME [name], MD Taking Active   ondansetron (ZOFRAN) 4 MG tablet [id number] Yes TAKE 1 TABLET BY MOUTH EVERY 8 HOURS AS NEEDED FOR NAUSEA AND VOMITING [name], MD Taking Active   Polyethyl Glycol-Propyl Glycol [facility] OP) [id number] Yes Apply to eye. [provider] Taking Active   pseudoephedrine-guaifenesin (MUCINEX D) 60-600 MG 12 hr tablet [id number] Yes Take 1 tablet by mouth  2 (two) times daily as needed.  [provider] Taking Active   traMADol (ULTRAM) 50 MG tablet [id number] Yes TAKE 1 TABLET BY MOUTH EVERY 6 HOURS AS NEEDED FOR PAIN  [name], MD Taking Active   traZODone (DESYREL) 50 MG tablet [id number] No TAKE 0.5-1 TABLETS BY MOUTH AT BEDTIME AS NEEDED FOR SLEEP.  Patient not taking: No sig reported   [name], MD Not Taking Active   TRESIBA FLEXTOUCH 100 UNIT/ML SOPN FlexTouch Pen [id number] Yes 20 Units at bedtime. [provider] Taking Active            Med Note Darnelle Maffucci, Maralyn Sago Oct 26, 2020 11:27 AM) 6 units if glucose >200   triamcinolone (NASACORT) 55 MCG/ACT AERO nasal inhaler [id number] Yes Place 2 sprays into the nose daily. [provider] Taking Active   ursodiol (ACTIGALL) 500 MG tablet [id number] Yes Take 500 mg by mouth daily. [provider] Taking Active           Patient Active Problem List   Diagnosis Date Noted  . 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  . Elevated LFTs 08/04/2020  . Chronic heart failure with preserved ejection fraction (HFpEF) ([place]) 06/15/2020  . Elevated aldolase level 04/28/2020  . Tick bite of back 02/14/2020  . Non-ischemic cardiomyopathy ([place]) 01/13/2020  . History of chest pain 10/20/2019  . Former smoker 08/30/2019  . Restrictive lung disease 08/30/2019  . Foot pain 02/16/2019  . Hair loss 08/31/2018  . Bilateral leg cramps 05/27/2018  . Memory difficulty 04/27/2018  . Iliotibial band syndrome 04/27/2018  . Allergic rhinitis 01/26/2018  . RUQ pain 10/28/2017  . Obesity (BMI 30.0-34.9) 10/28/2017  . Pain in limb 06/03/2017  . Throat fullness 10/15/2016  . 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 Rash 08/08/2016  . Lower abdominal pain 08/08/2016  . Cephalalgia 01/26/2016  . Dysuria 12/20/2015  . Vitamin D deficiency 12/20/2015  . Sjogren's syndrome ([place]) 12/20/2015  . Chronic fatigue 12/04/2015  . Insomnia 10/19/2015  . Chronic bilateral low back pain without sciatica 11/07/2014  . OSA on CPAP 10/19/2014  . Anxiety and depression 08/16/2014  . Allergic urticaria 06/02/2014  . Dyspnea 05/04/2014  .  Diabetes type 2, uncontrolled ([place]) 07/06/2013  . Malignant neoplasm of breast (female) ([place]) 02/28/2013  . Blood clotting disorder ([place]) 12/01/2012  . Palpitations 12/01/2012  . Hyperlipidemia 12/01/2012  . Chronic pain disorder 06/11/2012  . Hearing loss 09/16/2011  . Fibromyalgia 06/15/2011    Immunization History  Administered Date(s) Administered  . Pneumococcal Polysaccharide-23 10/07/2008  . Tdap 02/24/2020    Conditions to be addressed/monitored:  Hypertension, Hyperlipidemia, Diabetes, COPD and Asthma  Care Plan : Medication Management  Updates made by De Hollingshead, RPH-CPP since 12/07/2020 12:00 AM    Problem: Hyperlipidemia, Diabetes, COPD     Long-Range Goal: Disease Progression Prevention   Recent Progress: On track  Priority: High  Note:   Current Barriers:  . Unable to independently afford treatment regimen . Unable to achieve control of cholesterol   Pharmacist Clinical Goal(s):  Marland Kitchen Over the next 90 days, patient will verbalize ability to afford treatment regimen . Over the next 90 days, patient will achieve control of cholesterol through collaboration with PharmD and provider.   Interventions: . 1:1 collaboration with [name], MD regarding development and update of comprehensive plan of care as evidenced by provider attestation and co-signature . Inter-disciplinary care team collaboration (see longitudinal plan of care) . Comprehensive medication review  performed; medication list updated in electronic medical record  Diabetes: . Uncontrolled; current treatment: Tresiba 6 units PRN - per endocrinology, if fasting BG >130, Novolog 2-4 units TID per sliding scale; follows w/ Malissa Hippo at [facility]  . Approved for patient assistance through [employer].  o Hx intolerance to: metformin (GI upset), Invokana (yeast infections), Jardiance (pancreatitis), Trulicity (abdominal pain), glipizide XR (hypoglycemia) . Reviewed refill  procedure. Encouraged to communicate w/ Warnell Forester regarding [employer] and sending refills directly to [employer] at [phone]  Hyperlipidemia and ASCVD risk reduction: . Uncontrolled; current treatment: none . Antiplatelet regimen: aspirin 81 mg daily . Medications previously tried: rosuvastatin - liver enzyme elevations with therapy; Repatha (abdominal distention, headache, higher blood sugars, and more skin peeling on her face) . Continue to follow. Consider lipid clinic referral for discussion of advanced therapies.   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 Uncontrolled; current treatment: Spiriva, but not using  Albuterol HFA PRN, using very infrequently. Montelukast 10 mg daily, triamcinolone nasal daily PRN. Patient had contacted Dr. Zoila Shutter office via [place] to reported increased breathing concerns. Script for [employer] and azithromycin sent, but patient notes she was unaware. She will go pick up today.  . 1 exacerbation in the last 6 months . Reviewed principals of maintenance vs rescue therapy. Discussed that she would be over income for [employer] patient assistance. She would qualify for [employer] assistance - could pursue coverage for Symbicort vs Breztri. Encouraged patient to schedule f/u with pulmonary to discuss.   Anxiety: . Uncontrolled; current regimen: alprazolam 0.5 mg up to QID PRN anxiety, notes she is taking TID o Previously tried: escitalopram (noted rash) . Will further discuss treatment and safety moving forward. Consider alternative SSRI moving forward.   Pain: . Patient notes controlled; current regimen: tramadol 50 mg Q6H PRN, not needing every day. Baclofen 10 mg BID PRN.  Marland Kitchen Previously recommended to continue current regimen at this time  Gastrointestinal Concerns, Hepatic biliary cirrhosis: . Current regimen: ursodiol 500 mg daily; dicyclomine 10 mg TID PRN, famotidine 20 mg daily PRN; follows w/ Raylene Miyamoto at [facility] . Reports improvement in abdominal  discomfort with ursodiol.  . Recommend to current regimen at this time along with collaboration with GI.  Patient Goals/Self-Care Activities . Over the next 90 days, patient will:  - take medications as prescribed check glucose three times daily using CGM, document, and provide at future appointments collaborate with provider on medication access solutions  Follow Up Plan: Telephone follow up appointment with care management team member scheduled for: ~ 12 weeks       Medication Assistance: Mirian Mo obtained through [employer] medication assistance program.  Enrollment ends 08/29/21  Patient's preferred pharmacy is:  [id number] [place] [address]Phone: [phone]: [phone]  Care Plan and Follow Up Patient Decision:  Patient agrees to Care Plan and Follow-up.  Plan: Telephone follow up appointment with care management team member scheduled for:  ~ 12 weeks  Catie TDarnelle Maffucci PharmD, [place] C[place]waterClinical Pharmacist LOccidental Petrole[facility] [phone]

## 2020-12-11 DIAGNOSIS — I429 Cardiomyopathy, unspecified: Secondary | ICD-10-CM | POA: Diagnosis not present

## 2020-12-11 DIAGNOSIS — R0600 Dyspnea, unspecified: Secondary | ICD-10-CM | POA: Diagnosis not present

## 2020-12-11 DIAGNOSIS — R0789 Other chest pain: Secondary | ICD-10-CM | POA: Diagnosis not present

## 2020-12-12 ENCOUNTER — Other Ambulatory Visit: Payer: Self-pay | Admitting: Family Medicine

## 2021-01-01 ENCOUNTER — Ambulatory Visit: Payer: PPO

## 2021-01-01 ENCOUNTER — Telehealth: Payer: Self-pay

## 2021-01-01 NOTE — Telephone Encounter (Signed)
Unsuccessful attempt to each patient for scheduled awv. No answer. Left message to call the office back [phone] and reschedule when convenient. Removed from schedule.

## 2021-01-11 ENCOUNTER — Other Ambulatory Visit: Payer: Self-pay | Admitting: Family Medicine

## 2021-01-11 NOTE — Telephone Encounter (Signed)
RX Refill:xanax Last Seen:10-31-20 Last ordered:12-13-20

## 2021-01-23 ENCOUNTER — Other Ambulatory Visit: Payer: Self-pay

## 2021-01-24 ENCOUNTER — Encounter: Payer: Self-pay | Admitting: Family Medicine

## 2021-01-25 ENCOUNTER — Telehealth: Payer: Self-pay | Admitting: Family Medicine

## 2021-01-25 ENCOUNTER — Ambulatory Visit: Payer: PPO | Admitting: Internal Medicine

## 2021-01-25 NOTE — Telephone Encounter (Signed)
Patient dropped off paper work that Dr. Caryl Bis agreed to look at before patient's appointment on Monday, May 2nd. Paper work is up front in Dr. Ellen Henri color folder.

## 2021-01-29 ENCOUNTER — Ambulatory Visit (INDEPENDENT_AMBULATORY_CARE_PROVIDER_SITE_OTHER): Payer: PPO | Admitting: Family Medicine

## 2021-01-29 ENCOUNTER — Other Ambulatory Visit: Payer: Self-pay

## 2021-01-29 ENCOUNTER — Encounter: Payer: Self-pay | Admitting: Family Medicine

## 2021-01-29 ENCOUNTER — Telehealth: Payer: Self-pay

## 2021-01-29 VITALS — BP 122/70 | HR 81 | Temp 97.4°F | Ht 64.0 in | Wt 157.2 lb

## 2021-01-29 DIAGNOSIS — R760 Raised antibody titer: Secondary | ICD-10-CM

## 2021-01-29 DIAGNOSIS — Z23 Encounter for immunization: Secondary | ICD-10-CM

## 2021-01-29 DIAGNOSIS — J45909 Unspecified asthma, uncomplicated: Secondary | ICD-10-CM | POA: Diagnosis not present

## 2021-01-29 DIAGNOSIS — M3500 Sicca syndrome, unspecified: Secondary | ICD-10-CM | POA: Diagnosis not present

## 2021-01-29 DIAGNOSIS — E785 Hyperlipidemia, unspecified: Secondary | ICD-10-CM | POA: Diagnosis not present

## 2021-01-29 DIAGNOSIS — E1165 Type 2 diabetes mellitus with hyperglycemia: Secondary | ICD-10-CM | POA: Diagnosis not present

## 2021-01-29 DIAGNOSIS — R079 Chest pain, unspecified: Secondary | ICD-10-CM

## 2021-01-29 DIAGNOSIS — D689 Coagulation defect, unspecified: Secondary | ICD-10-CM | POA: Diagnosis not present

## 2021-01-29 DIAGNOSIS — R413 Other amnesia: Secondary | ICD-10-CM

## 2021-01-29 NOTE — Patient Instructions (Signed)
Nice to see you. Please see all your specialist as planned.

## 2021-01-29 NOTE — Telephone Encounter (Signed)
PT called wanting to speak to Gae Bon in regards to paperwork filled out today that had the wrong date on it for today and would like a callback.

## 2021-01-29 NOTE — Assessment & Plan Note (Signed)
Chronic issue with chronic pain associated with this.  I have advised against any exertion at this time while she awaits evaluation and possible treatment through cardiology.  She will see her rheumatologist as planned.

## 2021-01-29 NOTE — Telephone Encounter (Signed)
Patient came in for her Appointment and  wellness form was given to provider to sign. Kristina Mccoy,cma

## 2021-01-29 NOTE — Assessment & Plan Note (Signed)
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  She reports never having seen hematology for evaluation.  Referral placed.

## 2021-01-29 NOTE — Assessment & Plan Note (Signed)
The patient has had ongoing issues with this.  She had work-up through cardiology and follows up with them in the near future.  I encouraged her to restart her aspirin as she recently self discontinued this.  I discussed that the benefit to her outweighs the risk given her recent cardiac issues and her possible history of a blood clotting disorder with elevated anticardiolipin IgM.  I advised her against any exertional activities given her recent chest pain issues.  She will see cardiology as planned.

## 2021-01-29 NOTE — Telephone Encounter (Signed)
Patients medication [name], Novolog and needles came in today and patient was scheduled for an appointment today, Medication was given to patient at her appointment today.  Bejamin Hackbart,cma

## 2021-01-29 NOTE — Assessment & Plan Note (Signed)
Doing much better now.  She will monitor and continue her Breo.  She will see pulmonology as planned.

## 2021-01-29 NOTE — Assessment & Plan Note (Signed)
Patient does have some cognitive difficulties per her report related to her autoimmune issues.  She will see neurology for follow-up in the near future.

## 2021-01-29 NOTE — Assessment & Plan Note (Signed)
The patient has been intolerant of medications for this.  Cardiology will need to determine what she can take.

## 2021-01-29 NOTE — Progress Notes (Signed)
[name], MD Phone: [phone]  Kristina Mccoy is a 65 y.o. female who presents today for follow-up.  Sjogren's syndrome/chronic pain: Patient notes she continues to have issues with widespread discomfort and joint pains.  She also has significant fatigue.  She has Sjogren's and is following with rheumatology for this.  She has tried exercise though goes about 10 minutes and has lots of pain in her joints and muscles with this.  She notes cognitive issues related to this as well.  She notes she cannot think right and has trouble doing small tasks.  She has trouble understanding things verbally at times.  She follows with neurology in the near future.  Chest pain: She has been having intermittent chest discomfort with exertion.  She saw cardiology recently and had a nuclear stress MRI.  She notes they noted that there was minor damage and she follows up with a cardiologist later this month.  She was unable to tolerate Repatha related to back pain.  Asthma: She notes having a significant asthma attack several weeks ago.  Her pulmonologist placed her on Breo which has been helpful.  She used her rescue inhaler at that time with good benefit.  They also treat her with azithromycin.  She sees pulmonology in the near future for follow-up.  Diabetes: The patient notes her most recent A1c was 7.7.  Notes her 7-day average sugars is 207.  She has been taking [place] and NovoLog.  She sees endocrinology in the near future.  Social History   Tobacco Use  Smoking Status Former Smoker  . Packs/day: 1.00  . Years: 30.00  . Pack years: 30.00  . Types: Cigarettes  . Quit date: 02/28/2013  . Years since quitting: 7.9  Smokeless Tobacco Never Used    Current Outpatient Medications on File Prior to Visit  Medication Sig Dispense Refill  . albuterol (VENTOLIN HFA) 108 (90 Base) MCG/ACT inhaler Inhale 2 puffs into the lungs every 4 (four) hours as needed for wheezing or shortness of breath. 18 g 0   . ALPRAZolam (XANAX) 0.5 MG tablet TAKE 1 TABLET (0.5 MG TOTAL) BY MOUTH 4 (FOUR) TIMES DAILY AS NEEDED FOR ANXIETY. 120 tablet 0  . aspirin EC 81 MG tablet Take 1 tablet (81 mg total) by mouth daily. 90 tablet 3  . baclofen (LIORESAL) 10 MG tablet Take 10 mg by mouth 2 (two) times daily.    . Biotin 10 MG TABS Take by mouth.    . Cholecalciferol (VITAMIN D3) 1000 units CAPS Take by mouth daily.     . clobetasol ointment (TEMOVATE) 0.05 % Apply topically. Vaginally    . Continuous Blood Gluc Sensor (FREESTYLE LIBRE 14 DAY SENSOR) MISC SMARTSIG:1 Each Topical Every 2 Weeks    . Cyanocobalamin 1000 MCG CAPS Take by mouth. Takes occassionally    . dicyclomine (BENTYL) 10 MG capsule Take 1 capsule (10 mg total) by mouth 3 (three) times daily as needed for spasms. 60 capsule 1  . Emollient (CERAVE) CREA Apply topically daily.     Marland Kitchen ibuprofen (ADVIL) 200 MG tablet Take 200 mg by mouth every 6 (six) hours as needed.    . insulin aspart (NOVOLOG) 100 UNIT/ML FlexPen Sliding scale up to three times daily with meals    . ketoconazole (NIZORAL) 2 % cream Apply 1 application topically 2 (two) times daily.  3  . ketoconazole (NIZORAL) 2 % shampoo Apply 1 application topically once a week.  11  . montelukast (SINGULAIR) 10 MG tablet  TAKE 1 TABLET BY MOUTH EVERYDAY AT BEDTIME 90 tablet 1  . ondansetron (ZOFRAN) 4 MG tablet TAKE 1 TABLET BY MOUTH EVERY 8 HOURS AS NEEDED FOR NAUSEA AND VOMITING 18 tablet 1  . Polyethyl Glycol-Propyl Glycol (SYSTANE OP) Apply to eye.    . pseudoephedrine-guaifenesin (MUCINEX D) 60-600 MG 12 hr tablet Take 1 tablet by mouth 2 (two) times daily as needed.     . traMADol (ULTRAM) 50 MG tablet TAKE 1 TABLET BY MOUTH EVERY 6 HOURS AS NEEDED FOR PAIN 120 tablet 0  . traZODone (DESYREL) 50 MG tablet TAKE 0.5-1 TABLETS BY MOUTH AT BEDTIME AS NEEDED FOR SLEEP. 90 tablet 2  . TRESIBA FLEXTOUCH 100 UNIT/ML SOPN FlexTouch Pen 20 Units at bedtime.  5  . triamcinolone (NASACORT) 55 MCG/ACT  AERO nasal inhaler Place 2 sprays into the nose daily.    . ursodiol (ACTIGALL) 500 MG tablet Take 500 mg by mouth daily.    . famotidine (PEPCID) 20 MG tablet Take 1 tablet (20 mg total) by mouth at bedtime for 14 days. (Patient taking differently: Take 20 mg by mouth daily. LRN) 14 tablet 0   No current facility-administered medications on file prior to visit.     ROS see history of present illness  Objective  Physical Exam Vitals:   01/29/21 1410  BP: 122/70  Pulse: 81  Temp: (!) 97.4 F (36.3 C)  SpO2: 99%    BP Readings from Last 3 Encounters:  01/29/21 122/70  10/31/20 118/70  08/18/20 122/68   Wt Readings from Last 3 Encounters:  01/29/21 157 lb 3.2 oz (71.3 kg)  10/31/20 151 lb 12.8 oz (68.9 kg)  08/28/20 151 lb (68.5 kg)    Physical Exam Constitutional:      General: She is not in acute distress.    Appearance: She is not diaphoretic.  Cardiovascular:     Rate and Rhythm: Normal rate and regular rhythm.     Heart sounds: Normal heart sounds.  Pulmonary:     Effort: Pulmonary effort is normal.     Breath sounds: Normal breath sounds.  Musculoskeletal:     Comments: Widespread muscular tenderness over her back and upper arms  Skin:    General: Skin is warm and dry.  Neurological:     Mental Status: She is alert.      Assessment/Plan: Please see individual problem list.  Problem List Items Addressed This Visit    Diabetes type 2, uncontrolled ([place]) (Chronic)    Seems to be uncontrolled.  She will continue her Tresiba 20 units once daily and NovoLog.  She will see endocrinology as planned.  I suspect they need to alter her regimen.      Sjogren's syndrome ([place]) (Chronic)    Chronic issue with chronic pain associated with this.  I have advised against any exertion at this time while she awaits evaluation and possible treatment through cardiology.  She will see her rheumatologist as planned.      Blood clotting disorder (HCC)    It appears she had a  positive anticardiolipin IgM in the past.  She reports never having seen hematology for evaluation.  Referral placed.      Hyperlipidemia    The patient has been intolerant of medications for this.  Cardiology will need to determine what she can take.      Chest pain    The patient has had ongoing issues with this.  She had work-up through cardiology and follows up with them  in the near future.  I encouraged her to restart her aspirin as she recently self discontinued this.  I discussed that the benefit to her outweighs the risk given her recent cardiac issues and her possible history of a blood clotting disorder with elevated anticardiolipin IgM.  I advised her against any exertional activities given her recent chest pain issues.  She will see cardiology as planned.      Memory difficulty    Patient does have some cognitive difficulties per her report related to her autoimmune issues.  She will see neurology for follow-up in the near future.      Reactive airway disease    Doing much better now.  She will monitor and continue her Breo.  She will see pulmonology as planned.       Other Visit Diagnoses    Anticardiolipin antibody positive    -  Primary   Relevant Orders   Ambulatory referral to Hematology / Oncology   Need for pneumococcal vaccination       Relevant Orders   Pneumococcal conjugate vaccine 20-valent (Prevnar 20) (Completed)       The patient does have a form for her disability insurance.  This has been completed for her.  She worked in [employer] and has a high school education with some community college courses taken.     This visit occurred during the SARS-CoV-2 public health emergency.  Safety protocols were in place, including screening questions prior to the visit, additional usage of staff PPE, and extensive cleaning of exam room while observing appropriate contact time as indicated for disinfecting solutions.   I have spent 45 minutes in the care of this  patient regarding history taking, documentation, completion of exam, completion of form, discussion of plan.   [name], MD [place]

## 2021-01-29 NOTE — Assessment & Plan Note (Signed)
Seems to be uncontrolled.  She will continue her Tresiba 20 units once daily and NovoLog.  She will see endocrinology as planned.  I suspect they need to alter her regimen.

## 2021-01-31 ENCOUNTER — Encounter: Payer: Self-pay | Admitting: Internal Medicine

## 2021-01-31 ENCOUNTER — Ambulatory Visit: Payer: PPO | Admitting: Internal Medicine

## 2021-01-31 ENCOUNTER — Other Ambulatory Visit: Payer: Self-pay

## 2021-01-31 VITALS — BP 118/70 | HR 68 | Temp 97.3°F | Ht 64.0 in | Wt 156.6 lb

## 2021-01-31 DIAGNOSIS — G4733 Obstructive sleep apnea (adult) (pediatric): Secondary | ICD-10-CM

## 2021-01-31 DIAGNOSIS — J4520 Mild intermittent asthma, uncomplicated: Secondary | ICD-10-CM

## 2021-01-31 DIAGNOSIS — K743 Primary biliary cirrhosis: Secondary | ICD-10-CM | POA: Diagnosis not present

## 2021-01-31 DIAGNOSIS — R110 Nausea: Secondary | ICD-10-CM | POA: Diagnosis not present

## 2021-01-31 NOTE — Progress Notes (Addendum)
[facility]   PFTs and mild restriction, RV 74, DLCO 61. Sleep study February 2016 AHI 14.3, AutoPap 5-15.  **Personally reviewed download data, 30 days as of 01/07/18.  Usage greater than 4 hours is 26/30 days.  Average usage on days used is 5 hours 50 minutes.  Set pressure is 8.  Residual AHI is 1.  This demonstrates very good compliance with excellent control of obstructive sleep apnea. **Sleep study, CPAP titration 04/09/16, CPAP was recommended at a pressure of 8.  **Imaging personally reviewed, 01/16/17; lungs unremarkable. **CBC 12/22/17, absolute eosinophil count 300.   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 BREAST SURGERY Bilateral    mastectomy  . COLONOSCOPY WITH PROPOFOL N/A 04/22/2017   Procedure: COLONOSCOPY WITH PROPOFOL;  Surgeon: Lollie Sails, MD;  Location[facility] ENDOSCOPY;  Service: Endoscopy;  Laterality: N/A;  . ESOPHAGOGASTRODUODENOSCOPY (EGD) WITH PROPOFOL N/A 04/22/2017   Procedure: ESOPHAGOGASTRODUODENOSCOPY (EGD) WITH PROPOFOL;  Surgeon: Lollie Sails, MD;  Location: [facility] ENDOSCOPY;  Service: Endoscopy;  Laterality: N/A;  . MASTECTOMY Bilateral 03/23/13   Family Hx:  Family History  Problem Relation Age of Onset  . Diabetes Mother   . Heart disease Mother   . Hyperlipidemia Mother   . Heart failure Mother   . Diabetes Other   . Heart disease Brother        Aortic valve disease  . Hyperlipidemia Brother   . Colon polyps Brother   . Stroke Maternal Grandmother   . Breast cancer Maternal Aunt   . Colon cancer Paternal Grandmother   . Rheum arthritis Sister   . Psoriasis Other        psoriatic arthritis   . Heart disease Brother 51       Tachycardia  . Heart disease Brother   . Ovarian cancer Neg Hx    Social Hx:   Social History   Tobacco Use  . Smoking status: Former Smoker    Packs/day: 1.00    Years: 30.00    Pack years: 30.00    Types: Cigarettes    Quit date: 02/28/2013    Years since quitting: 7.9  .  Smokeless tobacco: Never Used  Vaping Use  . Vaping Use: Never used  Substance Use Topics  . Alcohol use: No    Alcohol/week: 0.0 standard drinks  . Drug use: No   Medication:    Current Outpatient Medications:  .  albuterol (VENTOLIN HFA) 108 (90 Base) MCG/ACT inhaler, Inhale 2 puffs into the lungs every 4 (four) hours as needed for wheezing or shortness of breath., Disp: 18 g, Rfl: 0 .  ALPRAZolam (XANAX) 0.5 MG tablet, TAKE 1 TABLET (0.5 MG TOTAL) BY MOUTH 4 (FOUR) TIMES DAILY AS NEEDED FOR ANXIETY., Disp: 120 tablet, Rfl: 0 .  aspirin EC 81 MG tablet, Take 1 tablet (81 mg total) by mouth daily.,  Disp: 90 tablet, Rfl: 3 .  baclofen (LIORESAL) 10 MG tablet, Take 10 mg by mouth 2 (two) times daily., Disp: , Rfl:  .  Biotin 10 MG TABS, Take by mouth., Disp: , Rfl:  .  BREO ELLIPTA 200-25 MCG/INH AEPB, 1 puff daily., Disp: , Rfl:  .  Cholecalciferol (VITAMIN D3) 1000 units CAPS, Take by mouth daily. , Disp: , Rfl:  .  clobetasol ointment (TEMOVATE) 0.05 %, Apply topically. Vaginally, Disp: , Rfl:  .  Continuous Blood Gluc Sensor (FREESTYLE LIBRE 14 DAY SENSOR) MISC, SMARTSIG:1 Each Topical Every 2 Weeks, Disp: , Rfl:  .  Cyanocobalamin 1000 MCG CAPS, Take by mouth. Takes occassionally, Disp: , Rfl:  .  dicyclomine (BENTYL) 10 MG capsule, Take 1 capsule (10 mg total) by mouth 3 (three) times daily as needed for spasms., Disp: 60 capsule, Rfl: 1 .  Emollient (CERAVE) CREA, Apply topically daily. , Disp: , Rfl:  .  ibuprofen (ADVIL) 200 MG tablet, Take 200 mg by mouth every 6 (six) hours as needed., Disp: , Rfl:  .  insulin aspart (NOVOLOG) 100 UNIT/ML FlexPen, Sliding scale up to three times daily with meals, Disp: , Rfl:  .  ketoconazole (NIZORAL) 2 % cream, Apply 1 application topically 2 (two) times daily., Disp: , Rfl: 3 .  ketoconazole (NIZORAL) 2 % shampoo, Apply 1 application topically once a week., Disp: , Rfl: 11 .  montelukast (SINGULAIR) 10 MG tablet, TAKE 1 TABLET BY MOUTH EVERYDAY AT BEDTIME, Disp: 90 tablet, Rfl: 1 .  ondansetron (ZOFRAN) 4 MG tablet, TAKE 1 TABLET BY MOUTH EVERY 8 HOURS AS NEEDED FOR NAUSEA AND VOMITING, Disp: 18 tablet, Rfl: 1 .  Polyethyl Glycol-Propyl Glycol (SYSTANE OP), Apply to eye., Disp: , Rfl:  .  pseudoephedrine-guaifenesin (MUCINEX D) 60-600 MG 12 hr tablet, Take 1 tablet by mouth 2 (two) times daily as needed. , Disp: , Rfl:  .  REPATHA SURECLICK 622 MG/ML SOAJ, Inject 1 Syringe into the skin every 14 (fourteen) days., Disp: , Rfl:  .  traMADol (ULTRAM) 50 MG tablet, TAKE 1 TABLET BY MOUTH EVERY 6 HOURS AS NEEDED FOR PAIN, Disp: 120 tablet, Rfl: 0 .   traZODone (DESYREL) 50 MG tablet, TAKE 0.5-1 TABLETS BY MOUTH AT BEDTIME AS NEEDED FOR SLEEP., Disp: 90 tablet, Rfl: 2 .  TRESIBA FLEXTOUCH 100 UNIT/ML SOPN FlexTouch Pen, 20 Units at bedtime., Disp: , Rfl: 5 .  triamcinolone (NASACORT) 55 MCG/ACT AERO nasal inhaler, Place 2 sprays into the nose daily., Disp: , Rfl:  .  ursodiol (ACTIGALL) 500 MG tablet, Take 500 mg by mouth daily., Disp: , Rfl:  .  famotidine (PEPCID) 20 MG tablet, Take 1 tablet (20 mg total) by mouth at bedtime for 14 days. (Patient taking differently: Take 20 mg by mouth  daily. LRN), Disp: 14 tablet, Rfl: 0   Allergies:  Gabapentin, Oxycodone, Repatha [evolocumab], Carbamazepine, Doxycycline, Dulaglutide, Insulin glargine, Levaquin [levofloxacin], Metformin, Methylprednisolone, Morphine, Pregabalin, Rosuvastatin, Venlafaxine, Buprenorphine, Canagliflozin, Escitalopram, and Hydroxychloroquine    Review of Systems:  Gen:  Denies  fever, sweats, chills weight loss  HEENT: Denies blurred vision, double vision, ear pain, eye pain, hearing loss, nose bleeds, sore throat Cardiac:  No dizziness, chest pain or heaviness, chest tightness,edema, No JVD Resp:   + cough, -sputum production, -shortness of breath,+wheezing, -hemoptysis,  Gi: Denies swallowing difficulty, stomach pain, nausea or vomiting, diarrhea, constipation, bowel incontinence Gu:  Denies bladder incontinence, burning urine Ext:   Denies Joint pain, stiffness or swelling Skin: Denies  skin rash, easy bruising or bleeding or hives Endoc:  Denies polyuria, polydipsia , polyphagia or weight change Psych:   Denies depression, insomnia or hallucinations  Other:  All other systems negative    BP 118/70 (BP Location: Left Arm, Patient Position: Sitting, Cuff Size: Normal)   Pulse 68   Temp (!) 97.3 F (36.3 C) (Temporal)   Ht 5\' 4"  (1.626 m)   Wt 156 lb 9.6 oz (71 kg)   SpO2 100%   BMI 26.88 kg/m     Physical Examination:   General Appearance: No distress   Neuro:without focal findings,  speech normal,  HEENT: PERRLA, EOM intact.   Pulmonary: normal breath sounds, No wheezing.  CardiovascularNormal S1,S2.  No m/r/g.   Abdomen: Benign, Soft, non-tender. Renal:  No costovertebral tenderness  GU:  Not performed at this time. Endoc: No evident thyromegaly Skin:   warm, no rashes, no ecchymosis  Extremities: normal, no cyanosis, clubbing. PSYCHIATRIC: Mood, affect within normal limits.   ALL OTHER ROS ARE NEGATIVE    Assessment and Plan:  65 year old white female follow-up for shortness of breath or dyspnea exertion most likely related to restrictive lung disease and body habitus with a history of sjorgens syndrome with a history of radiation fibrosis with underlying intermittent reactive airways disease c/w ASTHMA/COPD  ASTHMA/COPD Well controlled now with BREO and albuterol as needed Patient with previous history of exacerbation over the last several months responded well to prednisone and Z-Pak Patient is to avoid triggers at all costs  CT of the chest abnormal radiological finding right upper lobe fibrosis Follow-up CT scans yearly There is no progression of disease over the last several years  OSA Patient uses and benefits CPAP therapy Patient with excellent compliance report Continue CPAP 8 cm water pressure AHI reduced to 0.5   Sjogren's syndrome follow-up with [facility] rheumatology  Cardiac disease and CHF Follow-up with cardiology as scheduled   MEDICATION ADJUSTMENTS/LABS AND TESTS ORDERED: Continue albuterol as prescribed Avoid secondhand smoke exposure Lung cancer screening referral  Continue CPAP as prescribed Continue BREO as prescribed  Patient satisfied with Plan of action and management. All questions answered  Follow up in 1 year  Total time spent 33 mins   Corrin Parker, M.D.  [facility]  Medical Director [place] Director [facility] Cardio-Pulmonary  Department

## 2021-01-31 NOTE — Patient Instructions (Addendum)
Continue albuterol as prescribed Continue BREO Avoid secondhand smoke exposure Follow up Lung cancer screening referral  Continue CPAP as prescribed

## 2021-02-06 ENCOUNTER — Encounter: Payer: Self-pay | Admitting: Oncology

## 2021-02-06 ENCOUNTER — Inpatient Hospital Stay: Payer: PPO

## 2021-02-06 ENCOUNTER — Inpatient Hospital Stay: Payer: PPO | Attending: Oncology | Admitting: Oncology

## 2021-02-06 VITALS — BP 124/73 | HR 66 | Temp 97.7°F | Resp 16 | Wt 156.9 lb

## 2021-02-06 DIAGNOSIS — Z803 Family history of malignant neoplasm of breast: Secondary | ICD-10-CM | POA: Insufficient documentation

## 2021-02-06 DIAGNOSIS — R760 Raised antibody titer: Secondary | ICD-10-CM | POA: Diagnosis not present

## 2021-02-06 DIAGNOSIS — Z853 Personal history of malignant neoplasm of breast: Secondary | ICD-10-CM | POA: Diagnosis not present

## 2021-02-06 DIAGNOSIS — Z800 Family history of malignant neoplasm of digestive organs: Secondary | ICD-10-CM

## 2021-02-06 DIAGNOSIS — Z87891 Personal history of nicotine dependence: Secondary | ICD-10-CM | POA: Diagnosis not present

## 2021-02-06 DIAGNOSIS — E785 Hyperlipidemia, unspecified: Secondary | ICD-10-CM | POA: Diagnosis not present

## 2021-02-06 DIAGNOSIS — E1169 Type 2 diabetes mellitus with other specified complication: Secondary | ICD-10-CM | POA: Diagnosis not present

## 2021-02-06 DIAGNOSIS — Z794 Long term (current) use of insulin: Secondary | ICD-10-CM | POA: Diagnosis not present

## 2021-02-06 DIAGNOSIS — E1165 Type 2 diabetes mellitus with hyperglycemia: Secondary | ICD-10-CM | POA: Diagnosis not present

## 2021-02-06 NOTE — Progress Notes (Signed)
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  female with PMH listed below was seen in consultation at the request of  Caryl Bis Angela Adam, MD  for evaluation of  positive anticardiolipin IgM  Patient was seen by [facility] rheumatology Dr. Lillie Columbia team for positive ANA, weakness, rash, polyarthralgia, dry eye syndrome. She had rheumatology work-up including antiphospholipid panel. 06/19/2020, anticardiolipin IgM 24, normal anticardiolipin IgG and IgA level. Negative anti beta 2 glycoprotein 1IgG, IgA, IgM levels are normal.   DRVV normal.  Patient denies any previous history of VTE.  She was eventually diagnosed with Sjogren's syndrome. 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   01/2013: Pt noted palpable right breast mass upper outer quadrant -> mammogram abnl -> Bx c/w IDC, ER/PR positive and HER2/neu negative.  03/23/13: Bilateral mastectomy + axillary LN dissection   04/23/13 - 06/04/13: Cycles #1-4 AC (Doxorubicin 60 mg/m2, Cyclophosphamide 600 mg/m2) q2wk   06/18/13 - 08/20/13: Cycles #1-3 Taxol (Paclitaxel 80 mg/m2 days 1, 8, 15, 22); Cycle #3 delayed by 1 wk and 20% dose reduction initiated due to neuropathy   09/2013 - 11/2013: XRT (with some dose delays)   12/29/13: Anastrozole 1 mg po qd (x2 wks only)  01/24/14: Switched to Letrozole 2.5 mg po qd  02/2014: Switched to Exemestane 25 mg po qd  05/2014: Switched to  Tamoxifen 20 mg po qd  Patient was last seen by [facility] oncology group on 06/25/2018. Patient struggled with all 3 aromatase inhibitors and was switched to tamoxifen.  She was recommended to continue tamoxifen and follow-up in a year.  Patient then lost to follow-up and is not currently taking tamoxifen.  It was unclear whether she stopped tamoxifen.  She has had bilateral mastectomy and has no breast concerns today.    Review of Systems  Constitutional: Negative for appetite change, chills, fatigue and fever.  HENT:   Negative for hearing loss and voice change.   Eyes: Negative for eye problems.  Respiratory: Negative for chest tightness and cough.   Cardiovascular: Negative for chest pain.       Intermittent chest discomfort  Gastrointestinal: Negative for abdominal distention, abdominal pain and blood in stool.  Endocrine: Negative for hot flashes.  Genitourinary: Negative for difficulty urinating and frequency.   Musculoskeletal: Negative for arthralgias.       Chronic aches and pains  Skin: Negative for itching and rash.  Neurological: Negative for extremity weakness.  Hematological: Negative for adenopathy.  Psychiatric/Behavioral: Negative for confusion.    MEDICAL HISTORY:  Past Medical History:  Diagnosis Date  . Abdominal pain, epigastric   . Abnormal chest CT 09/08/2014  . Allergic urticaria   . Blood clotting disorder ([place])   . Breast cancer ([place]) 5/14   left breast  . Breast cancer ([place]) 02/2013   Dr Laurance Flatten ([facility] [place])  . Diabetes mellitus   . Diverticulosis   . Fibromyalgia   . GERD (gastroesophageal reflux disease)   . Heart murmur   . Hyperlipidemia   .  Lymphedema of arm    right  . Myalgia   . Myositis   . Neuromyositis   . Otitis media, chronic   . Pain syndrome, chronic   . Peptic ulcer   . Sicca syndrome ([place])   . Sjogren's disease ([place])   . Sleep apnea   . SOB (shortness of breath)   . 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[place])     SURGICAL  HISTORY: Past Surgical History:  Procedure Laterality Date  . BREAST SURGERY Bilateral    mastectomy  . COLONOSCOPY WITH PROPOFOL N/A 04/22/2017   Procedure: COLONOSCOPY WITH PROPOFOL;  Surgeon: Lollie Sails, MD;  Location: [facility] ENDOSCOPY;  Service: Endoscopy;  Laterality: N/A;  . ESOPHAGOGASTRODUODENOSCOPY (EGD) WITH PROPOFOL N/A 04/22/2017   Procedure: ESOPHAGOGASTRODUODENOSCOPY (EGD) WITH PROPOFOL;  Surgeon: Lollie Sails, MD;  Location: [facility] ENDOSCOPY;  Service: Endoscopy;  Laterality: N/A;  . MASTECTOMY Bilateral 03/23/13    SOCIAL HISTORY: Social History   Socioeconomic History  . Marital status: Single    Spouse name: Not on file  . Number of children: Not on file  . Years of education: Not on file  . Highest education level: Not on file  Occupational History  . Occupation: disabled  Tobacco Use  . Smoking status: Former Smoker    Packs/day: 1.00    Years: 30.00    Pack years: 30.00    Types: Cigarettes    Quit date: 02/28/2013    Years since quitting: 7.9  . Smokeless tobacco: Never Used  Vaping Use  . Vaping Use: Never used  Substance and Sexual Activity  . Alcohol use: No    Alcohol/week: 0.0 standard drinks  . Drug use: No  . Sexual activity: Never    Birth control/protection: Post-menopausal  Other Topics Concern  . Not on file  Social History Narrative  . Not on file   Social Determinants of Health   Financial Resource Strain: Medium Risk  . Difficulty of Paying Living Expenses: Somewhat hard  Food Insecurity: Not on file  Transportation Needs: Not on file  Physical Activity: Not on file  Stress: Not on file  Social Connections: Not on file  Intimate Partner Violence: Not on file    FAMILY HISTORY: Family History  Problem Relation Age of Onset  . Diabetes Mother   . Heart disease Mother   . Hyperlipidemia Mother   . Heart failure Mother   . Diabetes Other   . Pancreatic cancer Father   . Heart disease Brother        Aortic valve  disease  . Hyperlipidemia Brother   . Colon polyps Brother   . Stroke Maternal Grandmother   . Breast cancer Maternal Aunt   . Colon cancer Paternal Grandmother   . Rheum arthritis Sister   . Psoriasis Other        psoriatic arthritis   . Heart disease Brother 51       Tachycardia  . Heart disease Brother   . Ovarian cancer Neg Hx     ALLERGIES:  is allergic to gabapentin, oxycodone, repatha [evolocumab], carbamazepine, doxycycline, dulaglutide, insulin glargine, levaquin [levofloxacin], metformin, methylprednisolone, morphine, pregabalin, rosuvastatin, venlafaxine, buprenorphine, canagliflozin, escitalopram, and hydroxychloroquine.  MEDICATIONS:  Current Outpatient Medications  Medication Sig Dispense Refill  . albuterol (VENTOLIN HFA) 108 (90 Base) MCG/ACT inhaler Inhale 2 puffs into the lungs every 4 (four) hours as needed for wheezing or shortness of breath. 18 g 0  . ALPRAZolam (XANAX) 0.5 MG tablet TAKE 1 TABLET (0.5  MG TOTAL) BY MOUTH 4 (FOUR) TIMES DAILY AS NEEDED FOR ANXIETY. 120 tablet 0  . aspirin EC 81 MG tablet Take 1 tablet (81 mg total) by mouth daily. 90 tablet 3  . baclofen (LIORESAL) 10 MG tablet Take 10 mg by mouth 2 (two) times daily as needed.    . Biotin 10 MG TABS Take by mouth.    Marland Kitchen BREO ELLIPTA 200-25 MCG/INH AEPB 1 puff daily.    . Cholecalciferol (VITAMIN D3) 1000 units CAPS Take by mouth daily.     . clobetasol ointment (TEMOVATE) 0.05 % Apply topically. Vaginally    . Continuous Blood Gluc Sensor (FREESTYLE LIBRE 14 DAY SENSOR) MISC SMARTSIG:1 Each Topical Every 2 Weeks    . Cyanocobalamin 1000 MCG CAPS Take by mouth. Takes occassionally    . dicyclomine (BENTYL) 10 MG capsule Take 1 capsule (10 mg total) by mouth 3 (three) times daily as needed for spasms. 60 capsule 1  . Emollient (CERAVE) CREA Apply topically daily.     . famotidine (PEPCID) 20 MG tablet Take 1 tablet (20 mg total) by mouth at bedtime for 14 days. (Patient taking differently: Take 20 mg  by mouth daily. LRN) 14 tablet 0  . ibuprofen (ADVIL) 200 MG tablet Take 200 mg by mouth every 6 (six) hours as needed.    . insulin aspart (NOVOLOG) 100 UNIT/ML FlexPen Sliding scale up to three times daily with meals    . ketoconazole (NIZORAL) 2 % cream Apply 1 application topically 2 (two) times daily.  3  . ketoconazole (NIZORAL) 2 % shampoo Apply 1 application topically once a week.  11  . montelukast (SINGULAIR) 10 MG tablet TAKE 1 TABLET BY MOUTH EVERYDAY AT BEDTIME 90 tablet 1  . ondansetron (ZOFRAN) 4 MG tablet TAKE 1 TABLET BY MOUTH EVERY 8 HOURS AS NEEDED FOR NAUSEA AND VOMITING 18 tablet 1  . Polyethyl Glycol-Propyl Glycol (SYSTANE OP) Apply to eye.    . pseudoephedrine-guaifenesin (MUCINEX D) 60-600 MG 12 hr tablet Take 1 tablet by mouth 2 (two) times daily as needed.     Marland Kitchen REPATHA SURECLICK 397 MG/ML SOAJ Inject 1 Syringe into the skin every 14 (fourteen) days.    . traMADol (ULTRAM) 50 MG tablet TAKE 1 TABLET BY MOUTH EVERY 6 HOURS AS NEEDED FOR PAIN 120 tablet 0  . traZODone (DESYREL) 50 MG tablet TAKE 0.5-1 TABLETS BY MOUTH AT BEDTIME AS NEEDED FOR SLEEP. 90 tablet 2  . TRESIBA FLEXTOUCH 100 UNIT/ML SOPN FlexTouch Pen 20 Units at bedtime.  5  . triamcinolone (NASACORT) 55 MCG/ACT AERO nasal inhaler Place 2 sprays into the nose daily.    . ursodiol (ACTIGALL) 500 MG tablet Take 500 mg by mouth daily.     No current facility-administered medications for this visit.     PHYSICAL EXAMINATION: ECOG PERFORMANCE STATUS: 1 - Symptomatic but completely ambulatory Vitals:   02/06/21 1517  BP: 124/73  Pulse: 66  Resp: 16  Temp: 97.7 F (36.5 C)   Filed Weights   02/06/21 1517  Weight: 156 lb 14.4 oz (71.2 kg)    Physical Exam Constitutional:      General: She is not in acute distress. HENT:     Head: Normocephalic and atraumatic.  Eyes:     General: No scleral icterus. Cardiovascular:     Rate and Rhythm: Normal rate and regular rhythm.     Heart sounds: Normal  heart sounds.  Pulmonary:     Effort: Pulmonary effort is normal. No  respiratory distress.     Breath sounds: No wheezing.  Abdominal:     General: Bowel sounds are normal. There is no distension.     Palpations: Abdomen is soft.     Comments: Right upper quadrant tenderness with palpation.  Musculoskeletal:        General: No deformity. Normal range of motion.     Cervical back: Normal range of motion and neck supple.  Skin:    General: Skin is warm and dry.     Findings: No erythema or rash.  Neurological:     Mental Status: She is alert and oriented to person, place, and time. Mental status is at baseline.     Cranial Nerves: No cranial nerve deficit.     Coordination: Coordination normal.  Psychiatric:        Mood and Affect: Mood normal.     LABORATORY DATA:  I have reviewed the data as listed Lab Results  Component Value Date   WBC 5.8 10/31/2020   HGB 14.4 10/31/2020   HCT 42.2 10/31/2020   MCV 91.4 10/31/2020   PLT 179.0 10/31/2020   Recent Labs    05/08/20 1528 06/19/20 0941 07/03/20 1443 07/25/20 0942 10/31/20 1136  NA 137  --  133*  --  134*  K 3.9  --  4.0  --  4.1  CL 99  --  97*  --  99  CO2 28  --  27  --  28  GLUCOSE 162*  --  236*  --  163*  BUN 19  --  17  --  15  CREATININE 0.79  --  0.66  --  0.75  CALCIUM 9.4  --  8.9  --  9.2  GFRNONAA  --   --  >60  --   --   GFRAA  --   --  >60  --   --   PROT  --  7.2 7.9 6.9 7.5  ALBUMIN  --  4.0 3.6 3.8 4.1  AST  --  48* 44* 35 53*  ALT  --  83* 62* 41* 78*  ALKPHOS  --  238* 191* 154* 209*  BILITOT  --  0.4 0.5 0.3 0.3  BILIDIR  --  0.1 0.1 0.1  --   IBILI  --   --  0.4  --   --    Iron/TIBC/Ferritin/ %Sat    Component Value Date/Time   FERRITIN 111.0 12/04/2015 0922      RADIOGRAPHIC STUDIES: I have personally reviewed the radiological images as listed and agreed with the findings in the report. No results found.    ASSESSMENT & PLAN:  1. Positive cardiolipin antibodies   2.  History of breast cancer    Previous labs reviewed and discussed with patient.  She has never had a personal history of VTE I explained to patient that cardiolipin antibodies can be nonspecific, especially when titration of 1:40. This does not meet criteria for diagnosis antiphospholipid disorders. I do not feel patient is at risk for antiphospholipid antibody-related disorders beyond the risk conferred by their medical histories. Repeat testing is warranted with new or recurrent clinical symptoms. Patient appreciates explanation.  History of breast cancer, stage III, ER/PR positive and HER2 negative patient had bilateral mastectomy and axillary lymph node dissection followed by adjuvant chemotherapy with AC-> Taxol, followed by about 5 years of antiestrogen treatments.  She struggled with all 3 aromatase inhibitors and was eventually switched to tamoxifen.  Lost follow-up with  [facility] oncology team. Recommend patient to reestablish care with [facility] oncology for follow-up  I will discharge patient today back to follow-up with primary care provider.  Encouraged her to continue follow-up with [facility] oncology.  She may reestablish care with me if she prefers to transfer her oncology care to [facility] cancer center. All questions were answered. The patient knows to call the clinic with any problems questions or concerns.  cc [name], MD   Thank you for this kind referral and the opportunity to participate in the care of this patient. A copy of today's note is routed to referring provider    Earlie Server, MD, PhD Hematology Oncology [facility] at [facility] Pager- [phone] 02/06/2021

## 2021-02-08 ENCOUNTER — Telehealth: Payer: Self-pay | Admitting: Family Medicine

## 2021-02-08 NOTE — Telephone Encounter (Signed)
"  Rejection Reason - Patient Declined - Patient saw a GI doc and was told surgery was not necessary" Kristina Mccoy said on Feb 06, 2021 2:34 PM  msg sent from [facility] general surgery

## 2021-02-09 ENCOUNTER — Other Ambulatory Visit: Payer: Self-pay | Admitting: Gastroenterology

## 2021-02-09 ENCOUNTER — Other Ambulatory Visit (HOSPITAL_COMMUNITY): Payer: Self-pay | Admitting: Gastroenterology

## 2021-02-09 DIAGNOSIS — R1011 Right upper quadrant pain: Secondary | ICD-10-CM

## 2021-02-12 ENCOUNTER — Other Ambulatory Visit: Payer: Self-pay | Admitting: Family Medicine

## 2021-02-12 NOTE — Telephone Encounter (Signed)
RX Refill:xanax Last Seen:01-29-21 Last ordered:01-11-21

## 2021-02-13 DIAGNOSIS — M3506 Sjogren syndrome with peripheral nervous system involvement: Secondary | ICD-10-CM | POA: Diagnosis not present

## 2021-02-13 DIAGNOSIS — Z780 Asymptomatic menopausal state: Secondary | ICD-10-CM | POA: Diagnosis not present

## 2021-02-13 DIAGNOSIS — G63 Polyneuropathy in diseases classified elsewhere: Secondary | ICD-10-CM | POA: Diagnosis not present

## 2021-02-13 DIAGNOSIS — R519 Headache, unspecified: Secondary | ICD-10-CM | POA: Diagnosis not present

## 2021-02-14 ENCOUNTER — Other Ambulatory Visit: Payer: Self-pay | Admitting: Family Medicine

## 2021-02-14 NOTE — Telephone Encounter (Signed)
RX Refill:tramadol Last Seen:01-29-21 Last ordered:10-13-20

## 2021-02-16 DIAGNOSIS — R0602 Shortness of breath: Secondary | ICD-10-CM | POA: Diagnosis not present

## 2021-02-16 DIAGNOSIS — E1169 Type 2 diabetes mellitus with other specified complication: Secondary | ICD-10-CM | POA: Diagnosis not present

## 2021-02-16 DIAGNOSIS — I219 Acute myocardial infarction, unspecified: Secondary | ICD-10-CM | POA: Diagnosis not present

## 2021-02-16 DIAGNOSIS — E785 Hyperlipidemia, unspecified: Secondary | ICD-10-CM | POA: Diagnosis not present

## 2021-02-16 DIAGNOSIS — R079 Chest pain, unspecified: Secondary | ICD-10-CM | POA: Diagnosis not present

## 2021-02-28 ENCOUNTER — Telehealth: Payer: Self-pay

## 2021-02-28 NOTE — Chronic Care Management (AMB) (Signed)
  Care Management   Note  02/28/2021 Name: Kristina Mccoy MRN: [id number] DOB: 1956/06/27  Kristina Mccoy is a 65 y.o. year old female who is a primary care patient of Caryl Bis, Angela Adam, MD and is actively engaged with the care management team. I reached out to [place] by phone today to assist with re-scheduling a follow up visit with the Pharmacist  Follow up plan: Telephone appointment with care management team member scheduled for:03/01/2021 @1 :[place], [place], [place], [place] [address] Direct Dial: [phone] [email or web address]: [email or web address]

## 2021-02-28 NOTE — Chronic Care Management (AMB) (Signed)
  Care Management   Note  02/28/2021 Name: Kristina Mccoy MRN: [id number] DOB: 1956-04-29  Kristina Mccoy is a 65 y.o. year old female who is a primary care patient of [name], MD and is actively engaged with the care management team. I reached out to [place] by phone today to assist with re-scheduling a follow up visit with the Pharmacist  Follow up plan: Unsuccessful telephone outreach attempt made. A HIPAA compliant phone message was left for the patient providing contact information and requesting a return call.  The care management team will reach out to the patient again over the next 1 days.  If patient returns call to provider office, please advise to call [place]  at [place], [place], [place], [place] [address] Direct Dial: [phone] [email or web address]: [email or web address]

## 2021-03-01 ENCOUNTER — Ambulatory Visit (INDEPENDENT_AMBULATORY_CARE_PROVIDER_SITE_OTHER): Payer: PPO | Admitting: Pharmacist

## 2021-03-01 ENCOUNTER — Telehealth: Payer: PPO

## 2021-03-01 DIAGNOSIS — J45909 Unspecified asthma, uncomplicated: Secondary | ICD-10-CM

## 2021-03-01 DIAGNOSIS — R413 Other amnesia: Secondary | ICD-10-CM

## 2021-03-01 DIAGNOSIS — E785 Hyperlipidemia, unspecified: Secondary | ICD-10-CM | POA: Diagnosis not present

## 2021-03-01 DIAGNOSIS — K743 Primary biliary cirrhosis: Secondary | ICD-10-CM

## 2021-03-01 DIAGNOSIS — E1165 Type 2 diabetes mellitus with hyperglycemia: Secondary | ICD-10-CM

## 2021-03-01 DIAGNOSIS — M3500 Sicca syndrome, unspecified: Secondary | ICD-10-CM

## 2021-03-01 DIAGNOSIS — G894 Chronic pain syndrome: Secondary | ICD-10-CM

## 2021-03-01 NOTE — Chronic Care Management (AMB) (Signed)
Chronic Care Management Pharmacy Note  03/01/2021 Name:  Kristina Mccoy MRN:  [id number] DOB:  October 03, 1955  Subjective: Kristina Mccoy is an 65 y.o. year old female who is a primary patient of Caryl Bis, Angela Adam, MD.  The CCM team was consulted for assistance with disease management and care coordination needs.    Engaged with patient by telephone for follow up visit in response to provider referral for pharmacy case management and/or care coordination services.   Consent to Services:  The patient was given information about Chronic Care Management services, agreed to services, and gave verbal consent prior to initiation of services.  Please see initial visit note for detailed documentation.   Patient Care Team: [name], MD as PCP - General (Family Medicine) End, Harrell Gave, MD as PCP - Cardiology (Cardiology) De Hollingshead, RPH-CPP (Pharmacist)   Objective:  Lab Results  Component Value Date   CREATININE 0.75 10/31/2020   CREATININE 0.66 07/03/2020   CREATININE 0.79 05/08/2020    Lab Results  Component Value Date   HGBA1C 8.0 (H) 02/15/2020   Last diabetic Eye exam:  Lab Results  Component Value Date/Time   HMDIABEYEEXA No Retinopathy 04/24/2020 12:00 AM    Last diabetic Foot exam:  Lab Results  Component Value Date/Time   HMDIABFOOTEX Normal 05/04/2014 12:00 AM        Component Value Date/Time   CHOL 289 (H) 04/28/2020 1446   CHOL 113 09/03/2014 0352   TRIG 177 (H) 04/28/2020 1446   TRIG 107 09/03/2014 0352   HDL 54 04/28/2020 1446   HDL 16 (L) 09/03/2014 0352   CHOLHDL 5.4 (H) 04/28/2020 1446   VLDL 23.6 04/27/2018 1419   VLDL 21 09/03/2014 0352   LDLCALC 200 (H) 04/28/2020 1446   LDLCALC 76 09/03/2014 0352   LDLDIRECT 153.0 06/19/2020 0941    Hepatic Function Latest Ref Rng & Units 10/31/2020 07/25/2020 07/03/2020  Total Protein 6.0 - 8.3 g/dL 7.5 6.9 7.9  Albumin 3.5 - 5.2 g/dL 4.1 3.8 3.6  AST 0 - 37 U/L 53(H) 35 44(H)  ALT 0  - 35 U/L 78(H) 41(H) 62(H)  Alk Phosphatase 39 - 117 U/L 209(H) 154(H) 191(H)  Total Bilirubin 0.2 - 1.2 mg/dL 0.3 0.3 0.5  Bilirubin, Direct 0.0 - 0.3 mg/dL - 0.1 0.1    Lab Results  Component Value Date/Time   TSH 0.57 02/15/2020 02:28 PM   TSH 1.32 08/31/2018 11:41 AM   FREET4 0.86 05/27/2012 03:59 PM    CBC Latest Ref Rng & Units 10/31/2020 02/19/2019 10/02/2018  WBC 4.0 - 10.5 K/uL 5.8 10.8 6.8  Hemoglobin 12.0 - 15.0 g/dL 14.4 13.5 14.4  Hematocrit 36.0 - 46.0 % 42.2 40.4 41.9  Platelets 150.0 - 400.0 K/uL 179.0 278 219    Lab Results  Component Value Date/Time   VD25OH 29 (L) 04/28/2020 02:46 PM   VD25OH 29.69 (L) 02/15/2020 02:28 PM   VD25OH 43.56 04/19/2016 10:49 AM    Clinical ASCVD: Yes  The ASCVD Risk score ([place]., et al., 2013) failed to calculate for the following reasons:   The patient has a prior MI or stroke diagnosis      Social History   Tobacco Use  Smoking Status Former Smoker  . Packs/day: 1.00  . Years: 30.00  . Pack years: 30.00  . Types: Cigarettes  . Quit date: 02/28/2013  . Years since quitting: 8.0  Smokeless Tobacco Never Used   BP Readings from Last 3 Encounters:  02/06/21 124/73  01/31/21 118/70  01/29/21 122/70   Pulse Readings from Last 3 Encounters:  02/06/21 66  01/31/21 68  01/29/21 81   Wt Readings from Last 3 Encounters:  02/06/21 156 lb 14.4 oz (71.2 kg)  01/31/21 156 lb 9.6 oz (71 kg)  01/29/21 157 lb 3.2 oz (71.3 kg)    Assessment: Review of patient past medical history, allergies, medications, health status, including review of consultants reports, laboratory and other test data, was performed as part of comprehensive evaluation and provision of chronic care management services.   SDOH:  (Social Determinants of Health) assessments and interventions performed:  SDOH Interventions   Flowsheet Row Most Recent Value  SDOH Interventions   Financial Strain Interventions Other (Comment)  [manufacturer assistance]       CCM Care Plan  Allergies  Allergen Reactions  . Gabapentin Itching and Swelling  . Oxycodone Itching and Rash  . Repatha [Evolocumab] Other (See Comments)    abdominal distention, headache, higher blood sugars, facial skin peeling  . Carbamazepine Nausea And Vomiting and Other (See Comments)    Abdominal pain   . Doxycycline Other (See Comments)  . Dulaglutide     Other reaction(s): Other (See Comments) Severe stomach pain  . Ezetimibe Other (See Comments)    myalgias  . Insulin Glargine Other (See Comments)    BLOATING, MUSCLE/JOINT PAIN  . Levaquin [Levofloxacin]     Diarrhea and pain in calf  . Metformin     Other reaction(s): Other (See Comments) Cramping- abdominal pain  . Methylprednisolone     Reddened face, puffy face   . Morphine Other (See Comments)  . Pregabalin Swelling    Swelling in arms and legs  . Rosuvastatin Other (See Comments)    LFTs increased   . Venlafaxine Other (See Comments)  . Buprenorphine Rash and Swelling  . Canagliflozin Other (See Comments) and Rash    YEAST INFECTIONS  . Escitalopram Rash  . Hydroxychloroquine Rash    Medications Reviewed Today    Reviewed by De Hollingshead, RPH-CPP (Pharmacist) on 03/01/21 at 1349  Med List Status: <None>  Medication Order Taking? Sig Documenting Provider Last Dose Status Informant  albuterol (VENTOLIN HFA) 108 (90 Base) MCG/ACT inhaler [id number] Yes Inhale 2 puffs into the lungs every 4 (four) hours as needed for wheezing or shortness of breath. Sharion Balloon, NP Taking Active            Med Note Nat Christen Mar 01, 2021  1:40 PM) Using ~ 3 times weekly  ALPRAZolam (XANAX) 0.5 MG tablet [id number] Yes TAKE 1 TABLET (0.5 MG TOTAL) BY MOUTH 4 (FOUR) TIMES DAILY AS NEEDED FOR ANXIETY. [name], MD Taking Active   aspirin EC 81 MG tablet [id number] Yes Take 1 tablet (81 mg total) by mouth daily. End, Harrell Gave, MD Taking Active   Biotin 10 MG TABS [id number] Yes Take by  mouth. [provider] Taking Active   BREO ELLIPTA 200-25 MCG/INH AEPB [id number] Yes Inhale 1 puff into the lungs daily. [provider] Taking Active   Cholecalciferol (VITAMIN D3) 1000 units CAPS [id number] Yes Take by mouth daily.  [provider] Taking Active            Med Note (NEWCOM[facility], BRANDY L   Wed Jan 12, 2020  1:21 PM)    clobetasol ointment (TEMOVATE) 0.05 % [id number] Yes Apply topically. Vaginally [provider] Taking Active  Med Note Mayo Ao Sep 18, 2020  1:46 PM)    Continuous Blood Gluc Sensor (FREESTYLE LIBRE 14 [place]) [place] [id number] Yes SMARTSIG:1 Each Topical Every 2 Weeks [provider] Taking Active   Cyanocobalamin 1000 MCG CAPS [id number] Yes Take by mouth. Takes occassionally [provider] Taking Active   donepezil (ARICEPT) 5 MG tablet [id number] Yes Take 5 mg by mouth at bedtime. [provider] Taking Active   Emollient [facility]) CREA [id number] Yes Apply topically daily.  [provider] Taking Active   famotidine (PEPCID) 20 MG tablet [id number] Yes Take 1 tablet (20 mg total) by mouth at bedtime for 14 days.  Patient taking differently: Take 20 mg by mouth daily. LRN   Jodelle Green, FNP Taking Expired 06/14/20 2359   ibuprofen (ADVIL) 200 MG tablet [id number] Yes Take 200 mg by mouth every 6 (six) hours as needed. [provider] Taking Active   insulin aspart (NOVOLOG) 100 UNIT/ML FlexPen [id number] Yes Sliding scale up to three times daily with meals per Endocrinology [provider] Taking Active   ketoconazole (NIZORAL) 2 % cream [id number] Yes Apply 1 application topically 2 (two) times daily as needed. [provider] Taking Active   ketoconazole (NIZORAL) 2 % shampoo [id number] Yes Apply 1 application topically as needed. [provider] Taking Active   montelukast (SINGULAIR) 10 MG tablet [id number] No TAKE 1  TABLET BY MOUTH EVERYDAY AT BEDTIME  Patient not taking: Reported on 03/01/2021   [name], MD Not Taking Active   ondansetron (ZOFRAN) 4 MG tablet [id number] Yes TAKE 1 TABLET BY MOUTH EVERY 8 HOURS AS NEEDED FOR NAUSEA AND VOMITING [name], MD Taking Active   Polyethyl Glycol-Propyl Glycol [facility] OP) [id number] Yes Apply to eye. [provider] Taking Active   pseudoephedrine-guaifenesin (MUCINEX D) 60-600 MG 12 hr tablet [id number] Yes Take 1 tablet by mouth 2 (two) times daily as needed.  [provider] Taking Active   rOPINIRole (REQUIP) 0.25 MG tablet [id number] Yes Take 0.25 mg by mouth daily as needed. [provider] Taking Active   traMADol (ULTRAM) 50 MG tablet [id number] Yes TAKE 1 TABLET BY MOUTH EVERY 6 HOURS AS NEEDED FOR PAIN [name], MD Taking Active   traZODone (DESYREL) 50 MG tablet [id number] No TAKE 0.5-1 TABLETS BY MOUTH AT BEDTIME AS NEEDED FOR SLEEP.  Patient not taking: Reported on 03/01/2021   [name], MD Not Taking Active   TRESIBA FLEXTOUCH 100 UNIT/ML [facility] FlexTouch Pen [id number] Yes 20 Units at bedtime. [provider] Taking Active            Med Note Darnelle Maffucci, Maralyn Sago Oct 26, 2020 11:27 AM) 6 units if glucose >200   triamcinolone (NASACORT) 55 MCG/ACT AERO nasal inhaler [id number] Yes Place 2 sprays into the nose daily. [provider] Taking Active   ursodiol (ACTIGALL) 500 MG tablet [id number] Yes Take 1,000 mg by mouth daily. [provider] Taking Active           Patient Active Problem List   Diagnosis Date Noted  . Reactive airway disease 01/29/2021  . 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  . Elevated LFTs 08/04/2020  . Chronic heart failure with preserved ejection fraction (HFpEF) ([place]) 06/15/2020  . Elevated aldolase level 04/28/2020  . Tick bite of back 02/14/2020  . Non-ischemic cardiomyopathy ([place]) 01/13/2020  . History of chest pain 10/20/2019  . Former  smoker 08/30/2019  .  Restrictive lung disease 08/30/2019  . Foot pain 02/16/2019  . Hair loss 08/31/2018  . Bilateral leg cramps 05/27/2018  . Memory difficulty 04/27/2018  . Iliotibial band syndrome 04/27/2018  . Allergic rhinitis 01/26/2018  . RUQ pain 10/28/2017  . Obesity (BMI 30.0-34.9) 10/28/2017  . Pain in limb 06/03/2017  . Chest pain 01/17/2017  . Throat fullness 10/15/2016  . Primary biliary cirrhosis ([place]) 10/15/2016  . Rash 08/08/2016  . Lower abdominal pain 08/08/2016  . Cephalalgia 01/26/2016  . Dysuria 12/20/2015  . Vitamin D deficiency 12/20/2015  . Sjogren's syndrome ([place]) 12/20/2015  . Chronic fatigue 12/04/2015  . Insomnia 10/19/2015  . Chronic bilateral low back pain without sciatica 11/07/2014  . OSA on CPAP 10/19/2014  . Anxiety and depression 08/16/2014  . Allergic urticaria 06/02/2014  . Dyspnea 05/04/2014  . Diabetes type 2, uncontrolled ([place]) 07/06/2013  . Malignant neoplasm of breast (female) ([place]) 02/28/2013  . Blood clotting disorder ([place]) 12/01/2012  . Palpitations 12/01/2012  . Hyperlipidemia 12/01/2012  . Chronic pain disorder 06/11/2012  . Hearing loss 09/16/2011  . Fibromyalgia 06/15/2011    Immunization History  Administered Date(s) Administered  . PNEUMOCOCCAL CONJUGATE-20 01/29/2021  . Pneumococcal Polysaccharide-23 10/07/2008  . Tdap 02/24/2020    Conditions to be addressed/monitored: HTN, HLD, COPD, DMII, Anxiety and Depression  Care Plan : Medication Management  Updates made by De Hollingshead, RPH-CPP since 03/01/2021 12:00 AM    Problem: Hyperlipidemia, Diabetes, COPD     Long-Range Goal: Disease Progression Prevention   Start Date: 03/01/2021  This Visit's Progress: On track  Recent Progress: On track  Priority: High  Note:   Current Barriers:  . Unable to independently afford treatment regimen . Unable to achieve control of cholesterol   Pharmacist Clinical Goal(s):  Marland Kitchen Over the next 90 days, patient will  verbalize ability to afford treatment regimen . Over the next 90 days, patient will achieve control of cholesterol through collaboration with PharmD and provider.   Interventions: . 1:1 collaboration with [name], MD regarding development and update of comprehensive plan of care as evidenced by provider attestation and co-signature . Inter-disciplinary care team collaboration (see longitudinal plan of care) . Comprehensive medication review performed; medication list updated in electronic medical record  Diabetes: . Uncontrolled; current treatment: Tresiba 6 units PRN fasting glucose >130 - though per endocrinology note, they advised her to increase to 8 units QPM regularly, Novolog 2-4 units TID per sliding scale; follows w/ Malissa Hippo at [facility]  . Approved for patient assistance through [employer].  o Hx intolerance to: metformin (GI upset), Invokana (yeast infections), Jardiance (pancreatitis), Trulicity (abdominal pain), glipizide XR (hypoglycemia) . Using Libre 14 day CGM.  Marland Kitchen Discussed availability of Libre 2 with low glucose alarms. Advised to discuss upgrade to [facility] 2 with endocrinology at next appointment.  . Discussed that per documentation, appears that patient should be taking basal insulin daily. Discussed pharmacokinetic principals behind taking basal insulin regularly, even if fasting BG at goal. Discussed that PRN use of basal results in more fluctuations that could impact lack of glycemic control. Encouraged to discuss with endocrinology at next appointment.   Hyperlipidemia and ASCVD risk reduction, aortic atherosclerosis: . Uncontrolled; current treatment: none; prescribed ezetimibe by cardiology but reports that she took the first dose, had myalgias, waited for another 2-3 days, retried, and had muscle aches. Asks if she thinks this is related to the medication or related to her baseline autoimmune disease . Antiplatelet regimen: aspirin 81 mg  daily . Medications previously tried: rosuvastatin - liver enzyme elevations with therapy; Repatha (abdominal distention, headache, higher blood sugars, and more skin peeling on her face); ezetimibe - reports muscle aches . Discussed that generally ezetimibe does not cause muscle aches, but can cause GI upset due to mechanism of action.  . Educated on goal LDL <70 given DM and risk factors for ASCVD . Patient notes she may consider retrial of ezetimibe, keeping in mind that her baseline is fluctuation in autoimmune disease often causing muscle aches. Encouraged to continue to collaborate with cardiology.   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 Uncontrolled; current treatment: Breo 200/25 mcg daily, Albuterol HFA PRN, using 2-3 times weekly lately. Notes that she stopped montelukast 10 mg daily due to lack of benefit; triamcinolone nasal daily PRN.  . 1 exacerbation in the last 6 months . Reviewed principals of maintenance vs rescue therapy. Would need to demonstrate $600 out of pocket spend total prior to qualifying for [place] patient assistance for Breo. Declines cost concerns at this time  Anxiety, Insomnia: . Suboptimally managed; current regimen: alprazolam 0.5 mg up to QID PRN anxiety, notes she is taking QID recently; reports she has not used trazodone recently due to lack of benefit o Previously tried: escitalopram (noted rash) o Trazodone - no benefit . Will further discuss treatment and safety moving forward, especially given periodic use of tramadol. Consider alternative class, such as SNRI, moving forward given possible allergic reaction to SSRI.  Neurologic Conditions: Marland Kitchen Moderately well controlled per patient report; follows w/ [facility] . Cognitive impairment: reports recent initiation of donepezil 5 mg QPM. Some GI concerns, though could be concurrent GI disease . Muscle spasms: ropinirole 0.25 mg daily PRN - though AVS notes that she could titrate to effect. Patient notes that  PRN use is sufficient at this time . Continue to monitor for dopaminergic effects such as hypotension, stomach upset, dizziness, drowsiness.   Pain: . Patient notes worsened control over the past few days, requiring more ibuprofen; current regimen: ibuprofen 400 mg PRN, often daily; tramadol 50 mg Q6H PRN, not needing every day. Stopped baclofen due to lack of benefit.   . Recommended to continue current regimen at this time. Monitor renal function with regular use of NSAIDs vs discussion w/ GI regarding regular use of acetaminophen.   Gastrointestinal Concerns, Hepatic biliary cirrhosis: . Current regimen: ursodiol 1000 mg daily; dicyclomine 10 mg TID PRN, famotidine 20 mg daily PRN; follows w/ Raylene Miyamoto at [facility] . Reports more stomach upset recently. Imaging tomorrow.  . Reports improvement in liver enzymes with ursodiol.  . Recommend to current regimen at this time along with collaboration with GI.  Patient Goals/Self-Care Activities . Over the next 90 days, patient will:  - take medications as prescribed check glucose three times daily using CGM, document, and provide at future appointments collaborate with provider on medication access solutions  Follow Up Plan: Telephone follow up appointment with care management team member scheduled for: ~ 12 weeks      Medication Assistance: Mirian Mo obtained through [employer] medication assistance program.  Enrollment ends 08/29/21  Patient's preferred pharmacy is:  [id number] - [place], [address] [place] [address] Phone: [phone] Fax: [phone]   Follow Up:  Patient agrees to Care Plan and Follow-up.  Plan: Telephone follow up appointment with care management team member scheduled for:  ~ 42 weeks  Catie Darnelle Maffucci, PharmD, [place], [place] Clinical Pharmacist [place]  [facility] [employer] [phone]

## 2021-03-01 NOTE — Patient Instructions (Signed)
Ms. Scholes,   It was great talking to you today!  Next time you see your endocrinologist, ask about 1) upgrading to the [facility] 2 Continuous Glucose Monitor (that has low glucose alarms) and 2) if they think you are/want you to be taking [place] daily.   I think it would be appropriate to re-try the ezetimibe when you are feeling more "back to baseline". If you continue to have symptoms, contact your cardiology team for other suggestions.   Take care!  Catie Darnelle Maffucci, PharmD [phone]  Visit Information  PATIENT GOALS: Goals Addressed              This Visit's Progress     Patient Stated   .  Medication Monitoring (pt-stated)        Patient Goals/Self-Care Activities . Over the next 90 days, patient will:  - take medications as prescribed check glucose three times daily using CGM, document, and provide at future appointments collaborate with provider on medication access solutions         The patient verbalized understanding of instructions, educational materials, and care plan provided today and agreed to receive a mailed copy of patient instructions, educational materials, and care plan.   Plan: Telephone follow up appointment with care management team member scheduled for:  ~ 12 weeks  Catie Darnelle Maffucci, PharmD, [place], [place] Clinical Pharmacist [employer] [facility] [employer] [phone]

## 2021-03-02 ENCOUNTER — Ambulatory Visit
Admission: RE | Admit: 2021-03-02 | Discharge: 2021-03-02 | Disposition: A | Discharge status: Home / Self Care | Payer: PPO | Source: Ambulatory Visit | Attending: Gastroenterology | Admitting: Gastroenterology

## 2021-03-02 ENCOUNTER — Other Ambulatory Visit: Payer: Self-pay

## 2021-03-02 DIAGNOSIS — K7689 Other specified diseases of liver: Secondary | ICD-10-CM | POA: Diagnosis not present

## 2021-03-02 DIAGNOSIS — R1011 Right upper quadrant pain: Secondary | ICD-10-CM | POA: Diagnosis not present

## 2021-03-05 NOTE — Telephone Encounter (Signed)
My cancer was in the right breast and I had a bilateral mastectomy.  Routing to Dr. Mortimer Fries to make aware.

## 2021-03-05 NOTE — Telephone Encounter (Signed)
Dr. Kasa, please advise.    

## 2021-03-12 ENCOUNTER — Encounter: Payer: Self-pay | Admitting: Family Medicine

## 2021-03-13 ENCOUNTER — Other Ambulatory Visit: Payer: Self-pay | Admitting: Family Medicine

## 2021-03-13 NOTE — Telephone Encounter (Signed)
RX Refill:xanax Last Seen:01-29-21 Last ordered:02-12-21

## 2021-03-13 NOTE — Telephone Encounter (Signed)
She would need to complete a virtual visit for this. Thanks.

## 2021-03-14 NOTE — Telephone Encounter (Signed)
Patient called back and is scheduled for a virtual visit.  Kristina Mccoy,cma

## 2021-03-16 ENCOUNTER — Telehealth (INDEPENDENT_AMBULATORY_CARE_PROVIDER_SITE_OTHER): Payer: PPO | Admitting: Family Medicine

## 2021-03-16 ENCOUNTER — Other Ambulatory Visit: Payer: Self-pay

## 2021-03-16 DIAGNOSIS — J0100 Acute maxillary sinusitis, unspecified: Secondary | ICD-10-CM

## 2021-03-16 MED ORDER — AMOXICILLIN-POT CLAVULANATE 875-125 MG PO TABS: 1.0000 | ORAL_TABLET | Freq: Two times a day (BID) | ORAL | 0 refills | Status: DC

## 2021-03-16 NOTE — Progress Notes (Signed)
Virtual Visit via telephone Note  This visit type was conducted due to national recommendations for restrictions regarding the COVID-19 pandemic (e.g. social distancing).  This format is felt to be most appropriate for this patient at this time.  All issues noted in this document were discussed and addressed.  No physical exam was performed (except for noted visual exam findings with Video Visits).   I connected with Kristina Mccoy today at  3:15 PM EDT by telephone and verified that I am speaking with the correct person using two identifiers. Location patient: home Location provider: work Persons participating in the virtual visit: patient, provider  I discussed the limitations, risks, security and privacy concerns of performing an evaluation and management service by telephone and the availability of in person appointments. I also discussed with the patient that there may be a patient responsible charge related to this service. The patient expressed understanding and agreed to proceed.  Interactive audio and video telecommunications were attempted between this provider and patient, however failed, due to patient having technical difficulties OR patient did not have access to video capability.  We continued and completed visit with audio only.   Reason for visit: same day visit  HPI: Sinusitis: Patient notes onset of symptoms 03/03/2021.  Started with using Mucinex D every other day and using her Nasonex consistently.  She has continued to have symptoms with significant sinus pressure and congestion particularly in her forehead.  She is not able to blow much out.  She has no cough or postnasal drip.  She did have some fevers and chills and muscle aches though those have improved slightly.  No shortness of breath.  No taste or smell disturbances.  No COVID exposures.  She is not vaccinated against COVID-19.  She had a negative home COVID test on 03/10/2021.   ROS: See pertinent positives and  negatives per HPI.  Past Medical History:  Diagnosis Date   Abdominal pain, epigastric    Abnormal chest CT 09/08/2014   Allergic urticaria    Blood clotting disorder (HCC)    Breast cancer ([place]) 5/14   left breast   Breast cancer ([place]) 02/2013   Dr Laurance Flatten ([facility])   Diabetes mellitus    Diverticulosis    Fibromyalgia    GERD (gastroesophageal reflux disease)    Heart murmur    Hyperlipidemia    Lymphedema of arm    right   Myalgia    Myositis    Neuromyositis    Otitis media, chronic    Pain syndrome, chronic    Peptic ulcer    Sicca syndrome (HCC)    Sjogren's disease (HCC)    Sleep apnea    SOB (shortness of breath)    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[place])     Past Surgical History:  Procedure Laterality Date   BREAST SURGERY Bilateral    mastectomy   COLONOSCOPY WITH PROPOFOL N/A 04/22/2017   Procedure: COLONOSCOPY WITH PROPOFOL;  Surgeon: Lollie Sails, MD;  Location: [facility] ENDOSCOPY;  Service: Endoscopy;  Laterality: N/A;   ESOPHAGOGASTRODUODENOSCOPY (EGD) WITH PROPOFOL N/A 04/22/2017   Procedure: ESOPHAGOGASTRODUODENOSCOPY (EGD) WITH PROPOFOL;  Surgeon: Lollie Sails, MD;  Location[facility] ENDOSCOPY;  Service: Endoscopy;  Laterality: N/A;   MASTECTOMY Bilateral 03/23/13    Family History  Problem Relation Age of Onset   Diabetes Mother    Heart disease Mother    Hyperlipidemia Mother    Heart failure Mother    Diabetes Other    Pancreatic  cancer Father    Heart disease Brother        Aortic valve disease   Hyperlipidemia Brother    Colon polyps Brother    Stroke Maternal Grandmother    Breast cancer Maternal Aunt    Colon cancer Paternal Grandmother    Rheum arthritis Sister    Psoriasis Other        psoriatic arthritis    Heart disease Brother 84       Tachycardia   Heart disease Brother    Ovarian cancer Neg Hx     SOCIAL HX: Former smoker   Current Outpatient Medications:    albuterol (VENTOLIN HFA) 108 (90 Base)  MCG/ACT inhaler, Inhale 2 puffs into the lungs every 4 (four) hours as needed for wheezing or shortness of breath., Disp: 18 g, Rfl: 0   ALPRAZolam (XANAX) 0.5 MG tablet, TAKE 1 TABLET (0.5 MG TOTAL) BY MOUTH 4 (FOUR) TIMES DAILY AS NEEDED FOR ANXIETY., Disp: 120 tablet, Rfl: 0   amoxicillin-clavulanate (AUGMENTIN) 875-125 MG tablet, Take 1 tablet by mouth 2 (two) times daily., Disp: 14 tablet, Rfl: 0   aspirin EC 81 MG tablet, Take 1 tablet (81 mg total) by mouth daily., Disp: 90 tablet, Rfl: 3   Biotin 10 MG TABS, Take by mouth., Disp: , Rfl:    BREO ELLIPTA 200-25 MCG/INH AEPB, Inhale 1 puff into the lungs daily., Disp: , Rfl:    Cholecalciferol (VITAMIN D3) 1000 units CAPS, Take by mouth daily. , Disp: , Rfl:    clobetasol ointment (TEMOVATE) 0.05 %, Apply topically. Vaginally, Disp: , Rfl:    Continuous Blood Gluc Sensor (FREESTYLE LIBRE 14 DAY SENSOR) MISC, SMARTSIG:1 Each Topical Every 2 Weeks, Disp: , Rfl:    Cyanocobalamin 1000 MCG CAPS, Take by mouth. Takes occassionally, Disp: , Rfl:    donepezil (ARICEPT) 5 MG tablet, Take 5 mg by mouth at bedtime., Disp: , Rfl:    Emollient (CERAVE) CREA, Apply topically daily. , Disp: , Rfl:    ibuprofen (ADVIL) 200 MG tablet, Take 200 mg by mouth every 6 (six) hours as needed., Disp: , Rfl:    insulin aspart (NOVOLOG) 100 UNIT/ML FlexPen, Sliding scale up to three times daily with meals per Endocrinology, Disp: , Rfl:    ketoconazole (NIZORAL) 2 % cream, Apply 1 application topically 2 (two) times daily as needed., Disp: , Rfl: 3   ketoconazole (NIZORAL) 2 % shampoo, Apply 1 application topically as needed., Disp: , Rfl: 11   montelukast (SINGULAIR) 10 MG tablet, TAKE 1 TABLET BY MOUTH EVERYDAY AT BEDTIME, Disp: 90 tablet, Rfl: 1   ondansetron (ZOFRAN) 4 MG tablet, TAKE 1 TABLET BY MOUTH EVERY 8 HOURS AS NEEDED FOR NAUSEA AND VOMITING, Disp: 18 tablet, Rfl: 1   Polyethyl Glycol-Propyl Glycol (SYSTANE OP), Apply to eye., Disp: , Rfl:     pseudoephedrine-guaifenesin (MUCINEX D) 60-600 MG 12 hr tablet, Take 1 tablet by mouth 2 (two) times daily as needed. , Disp: , Rfl:    rOPINIRole (REQUIP) 0.25 MG tablet, Take 0.25 mg by mouth daily as needed., Disp: , Rfl:    traMADol (ULTRAM) 50 MG tablet, TAKE 1 TABLET BY MOUTH EVERY 6 HOURS AS NEEDED FOR PAIN, Disp: 92 tablet, Rfl: 1   traZODone (DESYREL) 50 MG tablet, TAKE 0.5-1 TABLETS BY MOUTH AT BEDTIME AS NEEDED FOR SLEEP., Disp: 90 tablet, Rfl: 2   TRESIBA FLEXTOUCH 100 UNIT/ML SOPN FlexTouch Pen, 20 Units at bedtime., Disp: , Rfl: 5   triamcinolone (NASACORT) 60  MCG/ACT AERO nasal inhaler, Place 2 sprays into the nose daily., Disp: , Rfl:    ursodiol (ACTIGALL) 500 MG tablet, Take 1,000 mg by mouth daily., Disp: , Rfl:    famotidine (PEPCID) 20 MG tablet, Take 1 tablet (20 mg total) by mouth at bedtime for 14 days. (Patient taking differently: Take 20 mg by mouth daily. LRN), Disp: 14 tablet, Rfl: 0  EXAM: This was a telephone visit and thus no physical exam was completed.  ASSESSMENT AND PLAN:  Discussed the following assessment and plan:  Problem List Items Addressed This Visit     Sinusitis    Her symptoms are consistent with sinusitis.  We will proceed with treatment with Augmentin.  If she is not improving in the next 3 to 4 days with this she will let us know.       Relevant Medications   amoxicillin-clavulanate (AUGMENTIN) 875-125 MG tablet    Return if symptoms worsen or fail to improve.   I discussed the assessment and treatment plan with the patient. The patient was provided an opportunity to ask questions and all were answered. The patient agreed with the plan and demonstrated an understanding of the instructions.   The patient was advised to call back or seek an in-person evaluation if the symptoms worsen or if the condition fails to improve as anticipated.  I provided 7 minutes of non-face-to-face time during this encounter.   [name], MD

## 2021-03-16 NOTE — Assessment & Plan Note (Signed)
Her symptoms are consistent with sinusitis.  We will proceed with treatment with Augmentin.  If she is not improving in the next 3 to 4 days with this she will let us know.

## 2021-03-20 ENCOUNTER — Telehealth: Payer: Self-pay | Admitting: Family Medicine

## 2021-03-20 NOTE — Telephone Encounter (Signed)
Patient called to let Dr Caryl Bis know how she is feeling. Patient had a virtual with Dr Caryl Bis on 03/16/2021. She is still not feeling better, sinus  improved some, has sore throat, fatigue, no cough, body aches.

## 2021-03-21 MED ORDER — AZELASTINE HCL 0.1 % NA SOLN: 2.0000 | Freq: Two times a day (BID) | NASAL | 2 refills | Status: DC

## 2021-03-21 MED ORDER — CEFDINIR 300 MG PO CAPS: 300.0000 mg | ORAL_CAPSULE | Freq: Two times a day (BID) | ORAL | 0 refills | Status: DC

## 2021-03-21 MED ORDER — CLINDAMYCIN HCL 300 MG PO CAPS: 300.0000 mg | ORAL_CAPSULE | Freq: Three times a day (TID) | ORAL | 0 refills | Status: DC

## 2021-03-21 NOTE — Telephone Encounter (Signed)
Pt called and notified

## 2021-03-21 NOTE — Telephone Encounter (Signed)
Please follow-up with the patient today to see how she is feeling. Are the fatigue and body aches any different than her typical issues? Has she had any fevers. Her allergy to certain antibiotics makes it difficult to treat this and if she has not improved we would need to treat with two antibiotics that are different than what she has been on.

## 2021-03-21 NOTE — Telephone Encounter (Signed)
Noted. Clindamycin and cefdinir sent to the patients pharmacy. She will stop the augmentin. She needs to take a probiotic with these antibiotics and if she develops abdominal pain or diarrhea with the antibiotics she needs to let us know right away. If she does not improve with this she will need to see ENT. I also sent in astelin nasal spray for her to try as well to see if that will help.

## 2021-03-21 NOTE — Addendum Note (Signed)
Addended by: [name] on: 03/21/2021 02:49 PM   Modules accepted: Orders

## 2021-03-21 NOTE — Telephone Encounter (Signed)
Called pt. Pt states that symptoms seem more intense and she has had chills but no fevers. Last temperature she states she had was 97. Pt states she has been taking the antibiotic you prescribed and its been causing cramping and pain in her stomach. Pt states there has been slight improvement.

## 2021-03-30 DIAGNOSIS — M159 Polyosteoarthritis, unspecified: Secondary | ICD-10-CM | POA: Diagnosis not present

## 2021-03-30 DIAGNOSIS — M3508 Sjogren syndrome with gastrointestinal involvement: Secondary | ICD-10-CM | POA: Diagnosis not present

## 2021-03-30 DIAGNOSIS — B370 Candidal stomatitis: Secondary | ICD-10-CM | POA: Diagnosis not present

## 2021-04-12 ENCOUNTER — Other Ambulatory Visit: Payer: Self-pay | Admitting: Family Medicine

## 2021-04-12 NOTE — Telephone Encounter (Signed)
RX Refill:xanax Last Seen:03-16-21 Last ordered:03-14-21

## 2021-04-17 DIAGNOSIS — D472 Monoclonal gammopathy: Secondary | ICD-10-CM | POA: Diagnosis not present

## 2021-04-17 DIAGNOSIS — Z6828 Body mass index (BMI) 28.0-28.9, adult: Secondary | ICD-10-CM | POA: Diagnosis not present

## 2021-04-17 DIAGNOSIS — Z170 Estrogen receptor positive status [ER+]: Secondary | ICD-10-CM | POA: Diagnosis not present

## 2021-04-17 DIAGNOSIS — C50411 Malignant neoplasm of upper-outer quadrant of right female breast: Secondary | ICD-10-CM | POA: Diagnosis not present

## 2021-04-20 DIAGNOSIS — G4733 Obstructive sleep apnea (adult) (pediatric): Secondary | ICD-10-CM | POA: Diagnosis not present

## 2021-04-24 ENCOUNTER — Other Ambulatory Visit: Payer: Self-pay

## 2021-04-24 ENCOUNTER — Telehealth: Payer: Self-pay

## 2021-04-24 NOTE — Telephone Encounter (Signed)
Led and informed the patient that her medication [name] and Novolog has arrived with the needles and she can pick up before 5 pm and she understood.  Lynnzie Blackson,cma

## 2021-05-01 ENCOUNTER — Ambulatory Visit (INDEPENDENT_AMBULATORY_CARE_PROVIDER_SITE_OTHER): Payer: PPO | Admitting: Family Medicine

## 2021-05-01 ENCOUNTER — Other Ambulatory Visit: Payer: Self-pay

## 2021-05-01 DIAGNOSIS — M3500 Sicca syndrome, unspecified: Secondary | ICD-10-CM | POA: Diagnosis not present

## 2021-05-01 DIAGNOSIS — B3731 Acute candidiasis of vulva and vagina: Secondary | ICD-10-CM

## 2021-05-01 DIAGNOSIS — B373 Candidiasis of vulva and vagina: Secondary | ICD-10-CM

## 2021-05-01 DIAGNOSIS — E1165 Type 2 diabetes mellitus with hyperglycemia: Secondary | ICD-10-CM | POA: Diagnosis not present

## 2021-05-01 DIAGNOSIS — B370 Candidal stomatitis: Secondary | ICD-10-CM | POA: Diagnosis not present

## 2021-05-01 DIAGNOSIS — R778 Other specified abnormalities of plasma proteins: Secondary | ICD-10-CM | POA: Insufficient documentation

## 2021-05-01 MED ORDER — FLUCONAZOLE 150 MG PO TABS: 150.0000 mg | ORAL_TABLET | ORAL | 0 refills | Status: AC

## 2021-05-01 NOTE — Patient Instructions (Addendum)
Nice to see you. Please try the Tresiba at 4 units once nightly to see if that limits your risk of hypoglycemia.  If you continue to have low sugars please discuss further with your endocrinologist. Please try the Diflucan. Please complete your course of nystatin.

## 2021-05-01 NOTE — Assessment & Plan Note (Signed)
Recently restarted Plaquenil.  I advised that she needs to see her eye doctor and make sure they know she is on the Plaquenil.  She will continue to see rheumatology.

## 2021-05-01 NOTE — Assessment & Plan Note (Signed)
Discussed that there is a an asymmetric peak on her SPEP.  Advised that this did not appear to be out of the expected range though given that it was different than the other areas it could be concerning.  She will follow the recommendations of her specialist regarding rechecking this.

## 2021-05-01 NOTE — Progress Notes (Signed)
[name], MD Phone: [phone]  Kristina Mccoy is a 65 y.o. female who presents today for follow-up.  Diabetes: Notes her blood sugars have been fluctuating.  Her 7-day average is 200.  She has not been taking her [name] as it seems to consistently drop her sugar into the 70s.  She reports using the [place] a couple of times in the past month.  It looks like endocrinology recently increased her dose to 8 units daily.  She feels poorly when this occurs.  She does a sliding scale NovoLog with 5 units of NovoLog before meals.  She follows up with endocrinology in 2 weeks.  Sjogren's disease: Patient recently saw rheumatology and they started her back on Plaquenil.  She seems to be tolerating this.  She has an appointment with her eye doctor in August.  She has not started on pilocarpine yet.  She wants to get her thrush under control first.  Thrush: This has been a recurrent issue.  Per rheumatology this can be seen in patients with Sjogren's.  She tried clotrimazole though that made her nauseous.  She is now on nystatin which has been helping.  She also reports some vaginal itching and burning which is consistent with prior yeast infections.  Notes that started a couple days ago.  No vaginal discharge.  Abnormal SPEP: Patient had an asymmetric peak in the gamma region on her SPEP though the numbers were not out of the expected ranges.  Per the patient the plan is to repeat this in 4 to 6 months.  Social History   Tobacco Use  Smoking Status Former   Packs/day: 1.00   Years: 30.00   Pack years: 30.00   Types: Cigarettes   Quit date: 02/28/2013   Years since quitting: 8.1  Smokeless Tobacco Never    Current Outpatient Medications on File Prior to Visit  Medication Sig Dispense Refill   albuterol (VENTOLIN HFA) 108 (90 Base) MCG/ACT inhaler Inhale 2 puffs into the lungs every 4 (four) hours as needed for wheezing or shortness of breath. 18 g 0   ALPRAZolam (XANAX) 0.5 MG tablet  TAKE 1 TABLET (0.5 MG TOTAL) BY MOUTH 4 (FOUR) TIMES DAILY AS NEEDED FOR ANXIETY. 120 tablet 0   aspirin EC 81 MG tablet Take 1 tablet (81 mg total) by mouth daily. 90 tablet 3   azelastine (ASTELIN) 0.1 % nasal spray Place 2 sprays into both nostrils 2 (two) times daily. Use in each nostril as directed 30 mL 2   Biotin 10 MG TABS Take by mouth.     BREO ELLIPTA 200-25 MCG/INH AEPB Inhale 1 puff into the lungs daily.     cefdinir (OMNICEF) 300 MG capsule Take 1 capsule (300 mg total) by mouth 2 (two) times daily. 14 capsule 0   Cholecalciferol (VITAMIN D3) 1000 units CAPS Take by mouth daily.      clindamycin (CLEOCIN) 300 MG capsule Take 1 capsule (300 mg total) by mouth 3 (three) times daily. 21 capsule 0   clobetasol ointment (TEMOVATE) 0.05 % Apply topically. Vaginally     clotrimazole (MYCELEX) 10 MG troche Take 10 mg by mouth 5 (five) times daily.     Continuous Blood Gluc Sensor (FREESTYLE LIBRE 14 DAY SENSOR) MISC SMARTSIG:1 Each Topical Every 2 Weeks     Cyanocobalamin 1000 MCG CAPS Take by mouth. Takes occassionally     donepezil (ARICEPT) 5 MG tablet Take 5 mg by mouth at bedtime.     Emollient (CERAVE) CREA Apply  topically daily.      ibuprofen (ADVIL) 200 MG tablet Take 200 mg by mouth every 6 (six) hours as needed.     insulin aspart (NOVOLOG) 100 UNIT/ML FlexPen Sliding scale up to three times daily with meals per Endocrinology     ketoconazole (NIZORAL) 2 % cream Apply 1 application topically 2 (two) times daily as needed.  3   ketoconazole (NIZORAL) 2 % shampoo Apply 1 application topically as needed.  11   montelukast (SINGULAIR) 10 MG tablet TAKE 1 TABLET BY MOUTH EVERYDAY AT BEDTIME 90 tablet 1   ondansetron (ZOFRAN) 4 MG tablet TAKE 1 TABLET BY MOUTH EVERY 8 HOURS AS NEEDED FOR NAUSEA AND VOMITING 18 tablet 1   pilocarpine (SALAGEN) 5 MG tablet Take 5 mg by mouth 3 (three) times daily.     Polyethyl Glycol-Propyl Glycol (SYSTANE OP) Apply to eye.      pseudoephedrine-guaifenesin (MUCINEX D) 60-600 MG 12 hr tablet Take 1 tablet by mouth 2 (two) times daily as needed.      rOPINIRole (REQUIP) 0.25 MG tablet Take 0.25 mg by mouth daily as needed.     tamoxifen (NOLVADEX) 20 MG tablet Take 20 mg by mouth daily.     traMADol (ULTRAM) 50 MG tablet TAKE 1 TABLET BY MOUTH EVERY 6 HOURS AS NEEDED FOR PAIN 92 tablet 1   traZODone (DESYREL) 50 MG tablet TAKE 0.5-1 TABLETS BY MOUTH AT BEDTIME AS NEEDED FOR SLEEP. 90 tablet 2   TRESIBA FLEXTOUCH 100 UNIT/ML SOPN FlexTouch Pen 20 Units at bedtime.  5   triamcinolone (NASACORT) 55 MCG/ACT AERO nasal inhaler Place 2 sprays into the nose daily.     ursodiol (ACTIGALL) 500 MG tablet Take 1,000 mg by mouth daily.     famotidine (PEPCID) 20 MG tablet Take 1 tablet (20 mg total) by mouth at bedtime for 14 days. (Patient taking differently: Take 20 mg by mouth daily. LRN) 14 tablet 0   No current facility-administered medications on file prior to visit.     ROS see history of present illness  Objective  Physical Exam Vitals:   05/01/21 1230  BP: 110/70  Pulse: 81  Temp: (!) 96.6 F (35.9 C)  SpO2: 99%    BP Readings from Last 3 Encounters:  05/01/21 110/70  02/06/21 124/73  01/31/21 118/70   Wt Readings from Last 3 Encounters:  05/01/21 156 lb 12.8 oz (71.1 kg)  03/16/21 156 lb (70.8 kg)  02/06/21 156 lb 14.4 oz (71.2 kg)    Physical Exam Constitutional:      General: She is not in acute distress.    Appearance: She is not diaphoretic.  Cardiovascular:     Rate and Rhythm: Normal rate and regular rhythm.     Heart sounds: Normal heart sounds.  Pulmonary:     Effort: Pulmonary effort is normal.     Breath sounds: Normal breath sounds.  Skin:    General: Skin is warm and dry.  Neurological:     Mental Status: She is alert.     Assessment/Plan: Please see individual problem list.  Problem List Items Addressed This Visit     Diabetes type 2, uncontrolled ([place]) (Chronic)     Recently uncontrolled.  She has not been on a long-acting insulin.  I encouraged her to try the Tresiba at 4 units once daily.  She will continue NovoLog sliding scale as advised by endocrinology.  If she continues to feel as though she is dropping low with the [place]  she will let us and endocrinology know.  She may need to discuss alternative long-acting insulins with endocrinology.       Sjogren's syndrome ([place]) (Chronic)    Recently restarted Plaquenil.  I advised that she needs to see her eye doctor and make sure they know she is on the Plaquenil.  She will continue to see rheumatology.       Abnormal SPEP    Discussed that there is a an asymmetric peak on her SPEP.  Advised that this did not appear to be out of the expected range though given that it was different than the other areas it could be concerning.  She will follow the recommendations of her specialist regarding rechecking this.       Thrush    She will complete her course of nystatin.       Relevant Medications   fluconazole (DIFLUCAN) 150 MG tablet   Vaginal yeast infection    Symptoms are consistent with a vaginal yeast infection.  We will trial Diflucan and if not beneficial she is already seeing her GYN tomorrow for an exam.       Relevant Medications   fluconazole (DIFLUCAN) 150 MG tablet     Health Maintenance: she will see optho in the near future. She see's GYN tomorrow. A1c through endo.   Return in about 3 months (around 08/01/2021).  This visit occurred during the SARS-CoV-2 public health emergency.  Safety protocols were in place, including screening questions prior to the visit, additional usage of staff PPE, and extensive cleaning of exam room while observing appropriate contact time as indicated for disinfecting solutions.    [name], MD [place]

## 2021-05-01 NOTE — Assessment & Plan Note (Signed)
Recently uncontrolled.  She has not been on a long-acting insulin.  I encouraged her to try the Tresiba at 4 units once daily.  She will continue NovoLog sliding scale as advised by endocrinology.  If she continues to feel as though she is dropping low with the [place] she will let us and endocrinology know.  She may need to discuss alternative long-acting insulins with endocrinology.

## 2021-05-01 NOTE — Assessment & Plan Note (Signed)
Symptoms are consistent with a vaginal yeast infection.  We will trial Diflucan and if not beneficial she is already seeing her GYN tomorrow for an exam.

## 2021-05-01 NOTE — Assessment & Plan Note (Signed)
She will complete her course of nystatin.

## 2021-05-02 DIAGNOSIS — N898 Other specified noninflammatory disorders of vagina: Secondary | ICD-10-CM | POA: Diagnosis not present

## 2021-05-02 DIAGNOSIS — R102 Pelvic and perineal pain: Secondary | ICD-10-CM | POA: Diagnosis not present

## 2021-05-02 DIAGNOSIS — Z01419 Encounter for gynecological examination (general) (routine) without abnormal findings: Secondary | ICD-10-CM | POA: Diagnosis not present

## 2021-05-08 DIAGNOSIS — L218 Other seborrheic dermatitis: Secondary | ICD-10-CM | POA: Diagnosis not present

## 2021-05-08 DIAGNOSIS — L508 Other urticaria: Secondary | ICD-10-CM | POA: Diagnosis not present

## 2021-05-08 DIAGNOSIS — L578 Other skin changes due to chronic exposure to nonionizing radiation: Secondary | ICD-10-CM | POA: Diagnosis not present

## 2021-05-08 DIAGNOSIS — L720 Epidermal cyst: Secondary | ICD-10-CM | POA: Diagnosis not present

## 2021-05-14 ENCOUNTER — Other Ambulatory Visit: Payer: Self-pay | Admitting: Family Medicine

## 2021-05-14 DIAGNOSIS — R778 Other specified abnormalities of plasma proteins: Secondary | ICD-10-CM | POA: Diagnosis not present

## 2021-05-14 DIAGNOSIS — R779 Abnormality of plasma protein, unspecified: Secondary | ICD-10-CM | POA: Diagnosis not present

## 2021-05-23 ENCOUNTER — Encounter: Payer: Self-pay | Admitting: Family Medicine

## 2021-05-23 DIAGNOSIS — E1165 Type 2 diabetes mellitus with hyperglycemia: Secondary | ICD-10-CM

## 2021-05-24 ENCOUNTER — Telehealth: Payer: PPO

## 2021-05-24 ENCOUNTER — Telehealth: Payer: Self-pay | Admitting: Pharmacist

## 2021-05-24 NOTE — Telephone Encounter (Signed)
  Chronic Care Management   Note  05/24/2021 Name: Kristina Mccoy MRN: [id number] DOB: 29-Mar-1956   Attempted to contact patient for scheduled appointment for medication management support. Left HIPAA compliant message for patient to return my call at their convenience.    Plan: - If I do not hear back from the patient by end of business today, will collaborate with Care Guide to outreach to schedule follow up with me  Catie Darnelle Maffucci, PharmD, [place], [employer] Pharmacist [employer] at [employer] [phone]

## 2021-05-25 ENCOUNTER — Telehealth: Payer: Self-pay

## 2021-05-25 NOTE — Chronic Care Management (AMB) (Signed)
  Care Management   Note  05/25/2021 Name: Kristina Mccoy MRN: [id number] DOB: 05-Jun-1956  [name] is a 65 y.o. year old female who is a primary care patient of [name], MD and is actively engaged with the care management team. I reached out to [place] by phone today to assist with re-scheduling a follow up visit with the Pharmacist  Follow up plan: Unsuccessful telephone outreach attempt made. A HIPAA compliant phone message was left for the patient providing contact information and requesting a return call.  The care management team will reach out to the patient again over the next 7 days.  If patient returns call to provider office, please advise to call [facility]  at [place], [place], [place], [place] [address] Direct Dial: [phone] [email or web address]: [email or web address]

## 2021-05-31 NOTE — Chronic Care Management (AMB) (Signed)
  Care Management   Note  05/31/2021 Name: Shambhavi Sherron MRN: [id number] DOB: [birth year]  Elleni Chiarenza is a 65 y.o. year old female who is a primary care patient of [name], MD and is actively engaged with the care management team. I reached out to [place] by phone today to assist with re-scheduling a follow up visit with the Pharmacist  Follow up plan: Unsuccessful telephone outreach attempt made. A HIPAA compliant phone message was left for the patient providing contact information and requesting a return call.  The care management team will reach out to the patient again over the next 7 days.  If patient returns call to provider office, please advise to call [facility]  at [place], [place], [place], [place] [address] Direct Dial: [phone] [email or web address]: [email or web address]

## 2021-06-08 NOTE — Telephone Encounter (Signed)
3rd unsuccessful outreach  

## 2021-06-08 NOTE — Chronic Care Management (AMB) (Signed)
  Care Management   Note  06/08/2021 Name: Kristina Mccoy MRN: [id number] DOB: [birth year]  Kristina Mccoy is a 65 y.o. year old female who is a primary care patient of [name], MD and is actively engaged with the care management team. I reached out to [place] by phone today to assist with re-scheduling a follow up visit with the Pharmacist  Follow up plan: Unable to make contact on outreach attempts x 2. PCP [name], MD notified via routed documentation in medical record.   Noreene Larsson, [place], [place], [place] [address] Direct Dial: [phone] [email or web address]

## 2021-06-13 ENCOUNTER — Other Ambulatory Visit: Payer: Self-pay | Admitting: Family Medicine

## 2021-06-15 ENCOUNTER — Other Ambulatory Visit: Payer: Self-pay | Admitting: Family Medicine

## 2021-06-20 ENCOUNTER — Encounter: Payer: Self-pay | Admitting: Family Medicine

## 2021-06-28 DIAGNOSIS — N94819 Vulvodynia, unspecified: Secondary | ICD-10-CM | POA: Diagnosis not present

## 2021-06-28 DIAGNOSIS — R102 Pelvic and perineal pain: Secondary | ICD-10-CM | POA: Diagnosis not present

## 2021-07-03 NOTE — Telephone Encounter (Signed)
Error.  Kristina Mccoy,cma  

## 2021-07-12 ENCOUNTER — Other Ambulatory Visit: Payer: Self-pay | Admitting: Family Medicine

## 2021-07-13 NOTE — Telephone Encounter (Signed)
Reviewed PWPAWARE.

## 2021-07-18 DIAGNOSIS — N904 Leukoplakia of vulva: Secondary | ICD-10-CM | POA: Diagnosis not present

## 2021-07-18 DIAGNOSIS — Z124 Encounter for screening for malignant neoplasm of cervix: Secondary | ICD-10-CM | POA: Diagnosis not present

## 2021-07-18 DIAGNOSIS — N9089 Other specified noninflammatory disorders of vulva and perineum: Secondary | ICD-10-CM | POA: Diagnosis not present

## 2021-07-18 DIAGNOSIS — R102 Pelvic and perineal pain: Secondary | ICD-10-CM | POA: Diagnosis not present

## 2021-07-18 DIAGNOSIS — N83201 Unspecified ovarian cyst, right side: Secondary | ICD-10-CM | POA: Diagnosis not present

## 2021-07-18 DIAGNOSIS — N83291 Other ovarian cyst, right side: Secondary | ICD-10-CM | POA: Diagnosis not present

## 2021-07-18 DIAGNOSIS — N898 Other specified noninflammatory disorders of vagina: Secondary | ICD-10-CM | POA: Diagnosis not present

## 2021-07-18 DIAGNOSIS — N763 Subacute and chronic vulvitis: Secondary | ICD-10-CM | POA: Diagnosis not present

## 2021-07-18 DIAGNOSIS — N9489 Other specified conditions associated with female genital organs and menstrual cycle: Secondary | ICD-10-CM | POA: Diagnosis not present

## 2021-07-18 DIAGNOSIS — N94819 Vulvodynia, unspecified: Secondary | ICD-10-CM | POA: Diagnosis not present

## 2021-07-18 DIAGNOSIS — L814 Other melanin hyperpigmentation: Secondary | ICD-10-CM | POA: Diagnosis not present

## 2021-08-01 DIAGNOSIS — N9489 Other specified conditions associated with female genital organs and menstrual cycle: Secondary | ICD-10-CM | POA: Diagnosis not present

## 2021-08-01 DIAGNOSIS — K743 Primary biliary cirrhosis: Secondary | ICD-10-CM | POA: Diagnosis not present

## 2021-08-01 DIAGNOSIS — K219 Gastro-esophageal reflux disease without esophagitis: Secondary | ICD-10-CM | POA: Diagnosis not present

## 2021-08-02 ENCOUNTER — Encounter: Payer: Self-pay | Admitting: Physical Therapy

## 2021-08-02 ENCOUNTER — Other Ambulatory Visit: Payer: Self-pay

## 2021-08-02 ENCOUNTER — Ambulatory Visit: Payer: PPO | Attending: Obstetrics and Gynecology | Admitting: Physical Therapy

## 2021-08-02 DIAGNOSIS — R102 Pelvic and perineal pain: Secondary | ICD-10-CM | POA: Insufficient documentation

## 2021-08-02 DIAGNOSIS — R278 Other lack of coordination: Secondary | ICD-10-CM | POA: Diagnosis not present

## 2021-08-02 DIAGNOSIS — R293 Abnormal posture: Secondary | ICD-10-CM | POA: Diagnosis not present

## 2021-08-02 NOTE — Therapy (Signed)
[facility] [address]. [place], [place], [address] Phone: [phone]   Fax:  [phone]  Physical Therapy Evaluation  Patient Details  Name: Kristina Mccoy MRN: [id number] Date of Birth: 02-Jul-1956 Referring Provider (PT): Hassan Buckler   Encounter Date: 08/02/2021   PT End of Session - 08/02/21 1152     Visit Number 1    Number of Visits 12    Date for PT Re-Evaluation 10/25/21    Authorization Type IE 08/02/2021    PT Start Time 1200    PT Stop Time 1240    PT Time Calculation (min) 40 min    Activity Tolerance Patient tolerated treatment well    Behavior During Therapy [facility] for tasks assessed/performed             Past Medical History:  Diagnosis Date   Abdominal pain, epigastric    Abnormal chest CT 09/08/2014   Allergic urticaria    Blood clotting disorder ([place])    Breast cancer ([place]) 5/14   left breast   Breast cancer ([place]) 02/2013   Dr Laurance Flatten ([place])   Diabetes mellitus    Diverticulosis    Fibromyalgia    GERD (gastroesophageal reflux disease)    Heart murmur    Hyperlipidemia    Lymphedema of arm    right   Myalgia    Myositis    Neuromyositis    Otitis media, chronic    Pain syndrome, chronic    Peptic ulcer    Sicca syndrome ([place])    Sjogren's disease (HCC)    Sleep apnea    SOB (shortness of breath)    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[place])     Past Surgical History:  Procedure Laterality Date   BREAST SURGERY Bilateral    mastectomy   COLONOSCOPY WITH PROPOFOL N/A 04/22/2017   Procedure: COLONOSCOPY WITH PROPOFOL;  Surgeon: Lollie Sails, MD;  Location[facility] ENDOSCOPY;  Service: Endoscopy;  Laterality: N/A;   ESOPHAGOGASTRODUODENOSCOPY (EGD) WITH PROPOFOL N/A 04/22/2017   Procedure: ESOPHAGOGASTRODUODENOSCOPY (EGD) WITH PROPOFOL;  Surgeon: Lollie Sails, MD;  Location: [facility] ENDOSCOPY;  Service: Endoscopy;  Laterality: N/A;   MASTECTOMY Bilateral 03/23/13     There were no vitals filed for this visit.        [facility] PT Assessment - 08/02/21 1148       Assessment   Medical Diagnosis Pelvic Pain    Referring Provider (PT) McVey, Ronita Hipps Dominance Right    Next MD Visit April 2023    Prior Therapy None for this dx      Balance Screen   Has the patient fallen in the past 6 months No             PELVIC HEALTH PHYSICAL THERAPY EVALUATION  SCREENING Red Flags: None Have you had any night sweats? Unexplained weight loss? Saddle anesthesia? Unexplained changes in bowel or bladder habits?   SUBJECTIVE  Chief Complaint: Patient reports that she has some issues with urgency where she doesn't feel like she is going to make it, but then she is unable to urinate when she gets to the toilet (40%). Patient notes that SUI is prominent with coughing and sneezing. Patient notes being bothered by having to void frequently. Patient discloses increased emotional distress as of late which she feels may be impacting her symptoms. Patient reports 100% of the time she has the urge to urinate.   Pertinent History:  Falls Negative.  Scoliosis Negative. Surgical history: Positive for see above.   Recent Procedures/Tests/Findings: recent vulvar biopsy: benign.   Obstetrical History: G0P0  Gynecological History: Hysterectomy: No Vaginal/Abdominal Endometriosis: Negative Pelvic Organ Prolapse: Negative Pain with exam: Yes (8/10) Heaviness/pressure: Yes    Urinary History: Incontinence: Positive. Onset: "a long time; 6 months to 1 year" Triggers: coughing, sneezing. (100%) Amount: Min/Mod. Protective undergarments: No   Fluid Intake: 3x 16.9 oz H20, 3x 24 oz unsweetened tea caffeinated, herbal tea Nocturia: 2-4x/night Frequency of urination: every 2 hours Toileting posture: feet flat Pain with urination: Positive for 50% (burning). Difficulty initiating urination: Positive Intermittent stream: Positive Frequent UTI: Negative.    Sexual activity/pain: Not presently sexually active.   Location of pain: RLQ Current pain:  0/10  Max pain:  5/10 (throbbing, intermittent/periodic, no predictable pattern) Least pain:  0/10 (with advil) Pain quality: pain quality: throbbing Radiating pain: No   Location of pain: vulvar Current pain:  5/10  Max pain:  10/10 (unpredictable flare ups; 8/10 pelvic exam) Least pain:  5/10 (with advil) Pain quality: pain quality: soreness, burning Radiating pain: No   Patient Goals:  "Don't have to go so frequently." ; not limited in community activities because of bladder and pain; reduce frequency, urgency, pressure, pain    OBJECTIVE  Mental Status Patient is oriented to person, place and time.  Recent memory is intact.  Remote memory is intact.  Attention span and concentration are intact.  Expressive speech is intact.  Patient's fund of knowledge is within normal limits for educational level.  POSTURE/OBSERVATIONS:  Lumbar lordosis: diminished Thoracic kyphosis: increased Iliac crest height: L elevated Lumbar lateral shift: negative Pelvic obliquity: R anterior  GAIT: Some lurching gait noted, L >R likely attributable to pelvic posture. Trendelenburg R: Positive L: Positive  RANGE OF MOTION: deferred 2/2 to time constraints   LEFT RIGHT  Lumbar forward flexion (65):      Lumbar extension (30):     Lumbar lateral flexion (25):     Thoracic and Lumbar rotation (30 degrees):       Hip Flexion (0-125):      Hip IR (0-45):     Hip ER (0-45):     Hip Abduction (0-40):     Hip extension (0-15):        STRENGTH: MMT deferred 2/2 to time constraints  RLE LLE  Hip Flexion    Hip Extension    Hip Abduction     Hip Adduction     Hip ER     Hip IR     Knee Extension    Knee Flexion    Dorsiflexion     Plantarflexion (seated)     ABDOMINAL: deferred 2/2 to time constraints Palpation: Diastasis: Scar mobility: Rib flare:  SPECIAL TESTS: deferred 2/2 to  time constraints   PHYSICAL PERFORMANCE MEASURES: STS: WFL  EXTERNAL PELVIC EXAM: deferred 2/2 to time constraints Palpation: Breath coordination: Voluntary Contraction: present/absent Relaxation: full/delayed/non-relaxing Perineal movement with sustained IAP increase ("bear down"): descent/no change/elevation/excessive descent Perineal movement with rapid IAP increase ("cough"): elevation/no change/descent  INTERNAL VAGINAL EXAM: deferred 2/2 to time constraints Introitus Appears:  Skin integrity:  Scar mobility: Strength (PERF):  Symmetry: Palpation: Prolapse:   INTERNAL RECTAL EXAM: not indicated Strength (PERF): Symmetry: Palpation: Prolapse:   OUTCOME MEASURES: FOTO (Urinary 58)   ASSESSMENT Patient is a 65 year old presenting to clinic with chief complaints of persistent pelvic pain at RLQ and vulva with frequent and incomplete voiding. Today's evaluation suggests deficits in  posture, PFM extensibility, PFM coordination, nervous system regulation, and pain as evidenced by 10/10 worst vulvar pain, 5/10 worst RLQ pain, intermittent stream and difficulty initiating urination, elevation of L IC with R pelvic obliquity. Patient's responses on FOTO outcome measures (Urinary 58) indicate significant functional limitations/disability/distress. Patient's progress may be limited due to persistence of pain and presence of comorbidities; however, patient's motivation is advantageous. Patient was able to achieve basic understanding of PFM function, bladder irritants, and toileting posture during today's evaluation and responded positively to educational interventions. Patient will benefit from continued skilled therapeutic intervention to address deficits in posture, PFM extensibility, PFM coordination, nervous system regulation, and pain in order to increase function and improve overall QOL.  EDUCATION Patient educated on prognosis, POC, and provided with HEP including: not initiated.  Patient articulated understanding and returned demonstration. Patient will benefit from further education in order to maximize compliance and understanding for long-term therapeutic gains.  TREATMENT Neuromuscular Re-education: Patient educated on primary functions of the pelvic floor including: posture/balance, sexual pleasure, storage and elimination of waste from the body, abdominal cavity closure, and breath coordination. Patient educated briefly on bladder irritants and toileting posture for decreased urinary frequency and improved emptying.      Objective measurements completed on examination: See above findings.        PT Long Term Goals - 08/02/21 1446       PT LONG TERM GOAL #1   Title Patient will demonstrate independence with HEP in order to maximize therapeutic gains and improve carryover from physical therapy sessions to ADLs in the home and community.    Baseline IE: not initiated    Time 12    Period Weeks    Status New    Target Date 10/25/21      PT LONG TERM GOAL #2   Title Patient will demonstrate coordinated lengthening and relaxation of PFM with diaphragmatic inhalation in order to decrease spasm and allow for unrestricted elimination of urine/feces for improved overall QOL.    Baseline IE: not demonstrated    Time 12    Period Weeks    Status New    Target Date 10/25/21      PT LONG TERM GOAL #3   Title Patient will demonstrate understanding of basic self-management/down-regulation of the nervous system for persistent pain condition and stress as evidenced by diaphragmatic breathing without cueing, body scan/progressive relaxation meditation, and improved sleep hygiene in order to transition to independent management of patient's chief complaint: persistent pelvic pain.    Baseline IE: not demonstrated    Time 12    Period Weeks    Status New    Target Date 10/25/21      PT LONG TERM GOAL #4   Title Patient will demonstrate improved function as  evidenced by a score of 65 on FOTO Urinary measure for full participation in activities at home and in the community.    Baseline IE: 53    Time 12    Period Weeks    Status New    Target Date 10/25/21      PT LONG TERM GOAL #5   Title Patient will report nocturia to be <2x/night for greater than 4 weeks in order to improve sleep quality and match age-predicted norms.    Baseline IE: 2-4x/night    Time 12    Period Weeks    Status New    Target Date 10/25/21  Plan - 08/02/21 1153     Clinical Impression Statement Patient is a 65 year old presenting to clinic with chief complaints of persistent pelvic pain at RLQ and vulva with frequent and incomplete voiding. Today's evaluation suggests deficits in posture, PFM extensibility, PFM coordination, nervous system regulation, and pain as evidenced by 10/10 worst vulvar pain, 5/10 worst RLQ pain, intermittent stream and difficulty initiating urination, elevation of L IC with R pelvic obliquity. Patient's responses on FOTO outcome measures (Urinary 58) indicate significant functional limitations/disability/distress. Patient's progress may be limited due to persistence of pain and presence of comorbidities; however, patient's motivation is advantageous. Patient was able to achieve basic understanding of PFM function, bladder irritants, and toileting posture during today's evaluation and responded positively to educational interventions. Patient will benefit from continued skilled therapeutic intervention to address deficits in posture, PFM extensibility, PFM coordination, nervous system regulation, and pain in order to increase function and improve overall QOL.    Personal Factors and Comorbidities Age;Behavior Pattern;Comorbidity 3+;Past/Current Experience;Time since onset of injury/illness/exacerbation    Comorbidities Sjogrens, lichen sclerosus hx, DM, diverticulosis, fibromyalgia, GERD, hyperlipidemia, chronic fatigue,  anxiety and depression, memory difficulty, insomnia    Stability/Clinical Decision Making Evolving/Moderate complexity    Clinical Decision Making Moderate    Rehab Potential Fair    PT Frequency 1x / week    PT Duration 12 weeks    PT Treatment/Interventions ADLs/Self Care Home Management;Cryotherapy;Electrical Stimulation;Moist Heat;Therapeutic activities;Therapeutic exercise;Neuromuscular re-education;Patient/family education;Orthotic Fit/Training;Manual techniques;Scar mobilization;Taping;Spinal Manipulations;Joint Manipulations    Consulted and Agree with Plan of Care Patient             Patient will benefit from skilled therapeutic intervention in order to improve the following deficits and impairments:  Abnormal gait, Pain, Postural dysfunction, Improper body mechanics, Decreased strength, Decreased coordination, Increased muscle spasms, Decreased activity tolerance  Visit Diagnosis: Pelvic pain  Abnormal posture  Abnormal coordination     Problem List Patient Active Problem List   Diagnosis Date Noted   Abnormal SPEP 05/01/2021   Reactive airway disease 01/29/2021   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   Elevated LFTs 08/04/2020   Chronic heart failure with preserved ejection fraction (HFpEF) ([place]) 06/15/2020   Elevated aldolase level 04/28/2020   Tick bite of back 02/14/2020   Non-ischemic cardiomyopathy ([place]) 01/13/2020   History of chest pain 10/20/2019   Former smoker 08/30/2019   Restrictive lung disease 08/30/2019   Foot pain 02/16/2019   Hair loss 08/31/2018   Bilateral leg cramps 05/27/2018   Memory difficulty 04/27/2018   Iliotibial band syndrome 04/27/2018   Allergic rhinitis 01/26/2018   RUQ pain 10/28/2017   Obesity (BMI 30.0-34.9) 10/28/2017   Sinusitis 07/25/2017   Pain in limb 06/03/2017   Chest pain 01/17/2017   Throat fullness 10/15/2016   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/15/2016   Rash 08/08/2016   Lower abdominal pain 08/08/2016   Cephalalgia  01/26/2016   Dysuria 12/20/2015   Vitamin D deficiency 12/20/2015   Sjogren's syndrome ([place]) 12/20/2015   Chronic fatigue 12/04/2015   Insomnia 10/19/2015   Thrush 03/27/2015   Chronic bilateral low back pain without sciatica 11/07/2014   OSA on CPAP 10/19/2014   Anxiety and depression 08/16/2014   Allergic urticaria 06/02/2014   Dyspnea 05/04/2014   Vaginal yeast infection 07/06/2013   Diabetes type 2, uncontrolled 07/06/2013   Malignant neoplasm of breast (female) ([place]) 02/28/2013   Blood clotting disorder ([place]) 12/01/2012   Palpitations 12/01/2012   Hyperlipidemia 12/01/2012   Chronic pain disorder 06/11/2012   Hearing  loss 09/16/2011   Fibromyalgia 06/15/2011    Myles Gip PT, DPT [phone]  08/02/2021, 2:55 PM  [facility] [address] [place], [place], [address] Phone: [phone]   Fax:  [phone]  Name: Kristina Mccoy MRN: [id number] Date of Birth: 1956/07/06

## 2021-08-03 ENCOUNTER — Ambulatory Visit: Payer: PPO | Admitting: Family Medicine

## 2021-08-06 ENCOUNTER — Encounter: Payer: Self-pay | Admitting: Family Medicine

## 2021-08-08 ENCOUNTER — Other Ambulatory Visit: Payer: Self-pay

## 2021-08-08 ENCOUNTER — Encounter: Payer: Self-pay | Admitting: Family Medicine

## 2021-08-08 ENCOUNTER — Ambulatory Visit (INDEPENDENT_AMBULATORY_CARE_PROVIDER_SITE_OTHER): Payer: PPO | Admitting: Family Medicine

## 2021-08-08 VITALS — BP 150/68 | HR 98 | Temp 97.7°F | Ht 64.0 in | Wt 158.2 lb

## 2021-08-08 DIAGNOSIS — F419 Anxiety disorder, unspecified: Secondary | ICD-10-CM | POA: Diagnosis not present

## 2021-08-08 DIAGNOSIS — F32A Depression, unspecified: Secondary | ICD-10-CM | POA: Diagnosis not present

## 2021-08-08 DIAGNOSIS — E119 Type 2 diabetes mellitus without complications: Secondary | ICD-10-CM | POA: Diagnosis not present

## 2021-08-08 DIAGNOSIS — R52 Pain, unspecified: Secondary | ICD-10-CM

## 2021-08-08 DIAGNOSIS — Z794 Long term (current) use of insulin: Secondary | ICD-10-CM

## 2021-08-08 DIAGNOSIS — R0981 Nasal congestion: Secondary | ICD-10-CM

## 2021-08-08 NOTE — Assessment & Plan Note (Signed)
Discussed that there are numerous causes that could be contributing to this.  This could be related to her Sjogren's or allergy issues.  This also could represent a viral illness, influenza, or COVID-19.  Discussed COVID-19 and influenza testing.  Advised to stay home until we get the COVID test back.  Discussed avoiding anybody that she lives with until we get the COVID test back.  She reports she has an oncology visit later today in [place] and I advised that she call them and report that she has symptoms and is being tested for COVID to see what they would like her to do with regards to their appointment.

## 2021-08-08 NOTE — Progress Notes (Signed)
[name], MD Phone: [phone]  Kristina Mccoy is a 65 y.o. female who presents today for f/u.  DIABETES Disease Monitoring: Blood Sugar ranges-up to 186 Polyuria/phagia/dipsia- increased polyuria and polydipsia      Medications: Compliance- taking tresiba 6 u nightly and novolog SSI up to 5 u with meals Hypoglycemic symptoms- this morning was down to 50  Anxiety/depression: Patient notes these things have worsened over the last several weeks.  She has a lot of stress.  She is moving.  She has financial stress as well.  She notes she started taking the trazodone again and that has helped with her sleep to a certain degree.  She notes that Xanax does help with the anxiety.  She notes no SI.  Congestion: Patient reports over the last 2 days she has generally not felt well.  She has had body aches, sinus congestion, sneezing, loss of appetite, and had chills and felt feverish 2 days ago.  No sick contacts.  No loss of taste or smell.  Social History   Tobacco Use  Smoking Status Former   Packs/day: 1.00   Years: 30.00   Pack years: 30.00   Types: Cigarettes   Quit date: 02/28/2013   Years since quitting: 8.4  Smokeless Tobacco Never    Current Outpatient Medications on File Prior to Visit  Medication Sig Dispense Refill   albuterol (VENTOLIN HFA) 108 (90 Base) MCG/ACT inhaler Inhale 2 puffs into the lungs every 4 (four) hours as needed for wheezing or shortness of breath. 18 g 0   ALPRAZolam (XANAX) 0.5 MG tablet TAKE 1 TABLET (0.5 MG TOTAL) BY MOUTH 4 (FOUR) TIMES DAILY AS NEEDED FOR ANXIETY. 120 tablet 0   aspirin EC 81 MG tablet Take 1 tablet (81 mg total) by mouth daily. 90 tablet 3   Azelastine HCl 137 MCG/SPRAY SOLN PLACE 2 SPRAYS INTO BOTH NOSTRILS 2 (TWO) TIMES DAILY. USE IN EACH NOSTRIL AS DIRECTED 30 mL 2   Biotin 10 MG TABS Take by mouth.     BREO ELLIPTA 200-25 MCG/INH AEPB Inhale 1 puff into the lungs daily.     cefdinir (OMNICEF) 300 MG capsule Take 1  capsule (300 mg total) by mouth 2 (two) times daily. 14 capsule 0   Cholecalciferol (VITAMIN D3) 1000 units CAPS Take by mouth daily.      clindamycin (CLEOCIN) 300 MG capsule Take 1 capsule (300 mg total) by mouth 3 (three) times daily. 21 capsule 0   clobetasol ointment (TEMOVATE) 0.05 % Apply topically. Vaginally     clotrimazole (MYCELEX) 10 MG troche Take 10 mg by mouth 5 (five) times daily.     Continuous Blood Gluc Sensor (FREESTYLE LIBRE 14 DAY SENSOR) MISC SMARTSIG:1 Each Topical Every 2 Weeks     Cyanocobalamin 1000 MCG CAPS Take by mouth. Takes occassionally     donepezil (ARICEPT) 5 MG tablet Take 5 mg by mouth at bedtime.     Emollient (CERAVE) CREA Apply topically daily.      ibuprofen (ADVIL) 200 MG tablet Take 200 mg by mouth every 6 (six) hours as needed.     insulin aspart (NOVOLOG) 100 UNIT/ML FlexPen Sliding scale up to three times daily with meals per Endocrinology     ketoconazole (NIZORAL) 2 % cream Apply 1 application topically 2 (two) times daily as needed.  3   ketoconazole (NIZORAL) 2 % shampoo Apply 1 application topically as needed.  11   montelukast (SINGULAIR) 10 MG tablet TAKE 1 TABLET BY MOUTH  EVERYDAY AT BEDTIME 90 tablet 1   ondansetron (ZOFRAN) 4 MG tablet TAKE 1 TABLET BY MOUTH EVERY 8 HOURS AS NEEDED FOR NAUSEA AND VOMITING 18 tablet 1   pilocarpine (SALAGEN) 5 MG tablet Take 5 mg by mouth 3 (three) times daily.     Polyethyl Glycol-Propyl Glycol (SYSTANE OP) Apply to eye.     pseudoephedrine-guaifenesin (MUCINEX D) 60-600 MG 12 hr tablet Take 1 tablet by mouth 2 (two) times daily as needed.      rOPINIRole (REQUIP) 0.25 MG tablet Take 0.25 mg by mouth daily as needed.     tamoxifen (NOLVADEX) 20 MG tablet Take 20 mg by mouth daily.     traMADol (ULTRAM) 50 MG tablet TAKE 1 TABLET BY MOUTH EVERY 6 HOURS AS NEEDED FOR PAIN 92 tablet 1   traZODone (DESYREL) 50 MG tablet TAKE 0.5-1 TABLETS BY MOUTH AT BEDTIME AS NEEDED FOR SLEEP. 90 tablet 2   TRESIBA  FLEXTOUCH 100 UNIT/ML SOPN FlexTouch Pen 20 Units at bedtime.  5   triamcinolone (NASACORT) 55 MCG/ACT AERO nasal inhaler Place 2 sprays into the nose daily.     ursodiol (ACTIGALL) 500 MG tablet Take 1,000 mg by mouth daily.     famotidine (PEPCID) 20 MG tablet Take 1 tablet (20 mg total) by mouth at bedtime for 14 days. (Patient taking differently: Take 20 mg by mouth daily. LRN) 14 tablet 0   No current facility-administered medications on file prior to visit.     ROS see history of present illness  Objective  Physical Exam Vitals:   08/08/21 1154  BP: (!) 150/68  Pulse: 98  Temp: 97.7 F (36.5 C)  SpO2: 99%    BP Readings from Last 3 Encounters:  08/08/21 (!) 150/68  05/01/21 110/70  02/06/21 124/73   Wt Readings from Last 3 Encounters:  08/08/21 158 lb 3.2 oz (71.8 kg)  05/01/21 156 lb 12.8 oz (71.1 kg)  03/16/21 156 lb (70.8 kg)    Physical Exam Constitutional:      General: She is not in acute distress.    Appearance: She is not diaphoretic.  HENT:     Right Ear: Tympanic membrane normal.     Left Ear: Tympanic membrane normal.  Cardiovascular:     Rate and Rhythm: Normal rate and regular rhythm.     Heart sounds: Normal heart sounds.  Pulmonary:     Effort: Pulmonary effort is normal.     Breath sounds: Normal breath sounds.  Lymphadenopathy:     Cervical: No cervical adenopathy.  Skin:    General: Skin is warm and dry.  Neurological:     Mental Status: She is alert.     Assessment/Plan: Please see individual problem list.  Problem List Items Addressed This Visit     Anxiety and depression    The patient reports worsening symptoms recently.  Denies SI.  Discussed starting on Cymbalta though the computer system advise me of a warning with the Cymbalta and tamoxifen.  I will check on this with our clinical pharmacist and then determine appropriate treatment for this issue.  She will follow-up in 3 months.  I will place a referral to a therapist.       Relevant Orders   Ambulatory referral to Psychology   Head congestion    Discussed that there are numerous causes that could be contributing to this.  This could be related to her Sjogren's or allergy issues.  This also could represent a viral illness, influenza,  or COVID-19.  Discussed COVID-19 and influenza testing.  Advised to stay home until we get the COVID test back.  Discussed avoiding anybody that she lives with until we get the COVID test back.  She reports she has an oncology visit later today in [place] and I advised that she call them and report that she has symptoms and is being tested for COVID to see what they would like her to do with regards to their appointment.      Type 2 diabetes mellitus (HCC)    Seems to have difficulty getting her sugars under control.  She is seeing endocrinology later this month.  She will continue her current [place] and NovoLog dosing and we will allow endocrinology to weigh in about altering her regimen.      Other Visit Diagnoses     Generalized body aches    -  Primary   Relevant Orders   COVID-19, Flu A+B and RSV        Return in about 3 months (around 11/08/2021).  This visit occurred during the SARS-CoV-2 public health emergency.  Safety protocols were in place, including screening questions prior to the visit, additional usage of staff PPE, and extensive cleaning of exam room while observing appropriate contact time as indicated for disinfecting solutions.    [name], MD [place]

## 2021-08-08 NOTE — Patient Instructions (Addendum)
Nice to see you.  Please stay home until we contact you with your results.

## 2021-08-08 NOTE — Assessment & Plan Note (Signed)
The patient reports worsening symptoms recently.  Denies SI.  Discussed starting on Cymbalta though the computer system advise me of a warning with the Cymbalta and tamoxifen.  I will check on this with our clinical pharmacist and then determine appropriate treatment for this issue.  She will follow-up in 3 months.  I will place a referral to a therapist.

## 2021-08-08 NOTE — Telephone Encounter (Signed)
I have checked up front on 08/06/21 and 08/07/21. I did not see any paperwork. Did you see any paperwork for the patient?

## 2021-08-08 NOTE — Assessment & Plan Note (Signed)
Seems to have difficulty getting her sugars under control.  She is seeing endocrinology later this month.  She will continue her current [place] and NovoLog dosing and we will allow endocrinology to weigh in about altering her regimen.

## 2021-08-09 ENCOUNTER — Telehealth: Payer: Self-pay | Admitting: Family Medicine

## 2021-08-09 ENCOUNTER — Encounter: Payer: PPO | Admitting: Physical Therapy

## 2021-08-09 LAB — COVID-19, FLU A+B AND RSV
Influenza A, NAA: NOT DETECTED
Influenza B, NAA: NOT DETECTED
RSV, NAA: NOT DETECTED
SARS-CoV-2, NAA: NOT DETECTED

## 2021-08-09 NOTE — Telephone Encounter (Signed)
Patient calling to follow up on anti depression medication and something for sleep. She stated the provider was to send in new medication for her depression and sleep.

## 2021-08-09 NOTE — Telephone Encounter (Signed)
Patient calling to follow up on anti depression medication and something for sleep. She stated the provider was to send in new medication for her depression and sleep.  Kristina Mccoy,cma

## 2021-08-10 ENCOUNTER — Encounter: Payer: Self-pay | Admitting: Physical Therapy

## 2021-08-10 ENCOUNTER — Other Ambulatory Visit: Payer: Self-pay

## 2021-08-10 ENCOUNTER — Ambulatory Visit: Payer: PPO | Admitting: Physical Therapy

## 2021-08-10 DIAGNOSIS — R102 Pelvic and perineal pain: Secondary | ICD-10-CM | POA: Diagnosis not present

## 2021-08-10 DIAGNOSIS — R293 Abnormal posture: Secondary | ICD-10-CM

## 2021-08-10 DIAGNOSIS — R278 Other lack of coordination: Secondary | ICD-10-CM

## 2021-08-10 MED ORDER — CITALOPRAM HYDROBROMIDE 10 MG PO TABS: 10.0000 mg | ORAL_TABLET | Freq: Every day | ORAL | 3 refills | Status: DC

## 2021-08-10 NOTE — Telephone Encounter (Signed)
Noted.  I was waiting on a response from our pharmacist.  The medication I was initially going to prescribe does interact with tamoxifen so we will avoid that.  I would like to try Celexa.  This should help with the depression and may help with her sleep as well.  It likely will not help with her chronic pain issues.  I will send this to her pharmacy.  She should keep her scheduled follow-up with me.  If she notices any side effects with starting the Celexa she should let us know.

## 2021-08-10 NOTE — Therapy (Signed)
[facility] [address] [place], [place], [address] Phone: [phone]   Fax:  [phone]  Physical Therapy Treatment  Patient Details  Name: Kristina Mccoy MRN: [id number] Date of Birth: [birth year] Referring Provider (PT): Hassan Buckler   Encounter Date: 08/10/2021   PT End of Session - 08/10/21 1015     Visit Number 2    Number of Visits 12    Date for PT Re-Evaluation 10/25/21    Authorization Type IE 08/02/2021    PT Start Time 1015    PT Stop Time 1055    PT Time Calculation (min) 40 min    Activity Tolerance Patient tolerated treatment well    Behavior During Therapy [facility] for tasks assessed/performed             Past Medical History:  Diagnosis Date   Abdominal pain, epigastric    Abnormal chest CT 09/08/2014   Allergic urticaria    Blood clotting disorder ([place])    Breast cancer ([place]) 5/14   left breast   Breast cancer ([place]) 02/2013   Dr Laurance Flatten ([place])   Diabetes mellitus    Diverticulosis    Fibromyalgia    GERD (gastroesophageal reflux disease)    Heart murmur    Hyperlipidemia    Lymphedema of arm    right   Myalgia    Myositis    Neuromyositis    Otitis media, chronic    Pain syndrome, chronic    Peptic ulcer    Sicca syndrome ([place])    Sjogren's disease ([facility])    Sleep apnea    SOB (shortness of breath)    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[place])     Past Surgical History:  Procedure Laterality Date   BREAST SURGERY Bilateral    mastectomy   COLONOSCOPY WITH PROPOFOL N/A 04/22/2017   Procedure: COLONOSCOPY WITH PROPOFOL;  Surgeon: Lollie Sails, MD;  Location: [facility] ENDOSCOPY;  Service: Endoscopy;  Laterality: N/A;   ESOPHAGOGASTRODUODENOSCOPY (EGD) WITH PROPOFOL N/A 04/22/2017   Procedure: ESOPHAGOGASTRODUODENOSCOPY (EGD) WITH PROPOFOL;  Surgeon: Lollie Sails, MD;  Location: [facility] ENDOSCOPY;  Service: Endoscopy;  Laterality: N/A;   MASTECTOMY Bilateral 03/23/13     There were no vitals filed for this visit.   Subjective Assessment - 08/10/21 1020     Subjective Patient denies any significant changes or new observations since initial visit.    Currently in Pain? Yes    Pain Score 5     Pain Location Vagina              TREATMENT  Pre-treatment assessment: R IC depression, R ASIS anterior  RANGE OF MOTION:    LEFT RIGHT  Lumbar forward flexion (65):  ~18 inches from floor    Lumbar extension (30): WFL    Lumbar lateral flexion (25):  To knee [facility]  Thoracic and Lumbar rotation (30 degrees):    [facility] WFL  Hip extension (0-15):  <10 <10   STRENGTH: MMT   RLE LLE  Hip Flexion 3* 4  Hip Abduction  4 4  Hip Adduction  4 4  Hip ER  4 4  Hip IR  4 4  Knee Extension 3+ 3+  Knee Flexion 3+ 3+  Dorsiflexion  4 4  Plantarflexion (seated) 5 5   ABDOMINAL:  Palpation: TTP throughout   Manual Therapy: Colon massage with patient education on GI anatomy and typical function. Patient provided with handout for home practice.  Neuromuscular Re-education: Patient education on MSK anatomy of RLQ and R hip for improved understanding of RLQ pain experience and connection to postural asymmetries.  Donning and doffing of R heel lift with gait in clinic to assess comfort and acute response.   Patient educated throughout session on appropriate technique and form using multi-modal cueing, HEP, and activity modification. Patient articulated understanding and returned demonstration.  Patient Response to interventions: Notes decreased tenderness in abdomen after colon massage  ASSESSMENT Patient presents to clinic with excellent motivation to participate in therapy. Patient demonstrates deficits in posture, PFM extensibility, PFM coordination, nervous system regulation, and pain. Patient able to achieve decreased abdominal tenderness with manual interventions during today's session and responded positively to educational interventions. Patient will  benefit from continued skilled therapeutic intervention to address remaining deficits in posture, PFM extensibility, PFM coordination, nervous system regulation, and pain in order to increase function and improve overall QOL.     PT Long Term Goals - 08/02/21 1446       PT LONG TERM GOAL #1   Title Patient will demonstrate independence with HEP in order to maximize therapeutic gains and improve carryover from physical therapy sessions to ADLs in the home and community.    Baseline IE: not initiated    Time 12    Period Weeks    Status New    Target Date 10/25/21      PT LONG TERM GOAL #2   Title Patient will demonstrate coordinated lengthening and relaxation of PFM with diaphragmatic inhalation in order to decrease spasm and allow for unrestricted elimination of urine/feces for improved overall QOL.    Baseline IE: not demonstrated    Time 12    Period Weeks    Status New    Target Date 10/25/21      PT LONG TERM GOAL #3   Title Patient will demonstrate understanding of basic self-management/down-regulation of the nervous system for persistent pain condition and stress as evidenced by diaphragmatic breathing without cueing, body scan/progressive relaxation meditation, and improved sleep hygiene in order to transition to independent management of patient's chief complaint: persistent pelvic pain.    Baseline IE: not demonstrated    Time 12    Period Weeks    Status New    Target Date 10/25/21      PT LONG TERM GOAL #4   Title Patient will demonstrate improved function as evidenced by a score of 65 on FOTO Urinary measure for full participation in activities at home and in the community.    Baseline IE: 9    Time 12    Period Weeks    Status New    Target Date 10/25/21      PT LONG TERM GOAL #5   Title Patient will report nocturia to be <2x/night for greater than 4 weeks in order to improve sleep quality and match age-predicted norms.    Baseline IE: 2-4x/night    Time 12     Period Weeks    Status New    Target Date 10/25/21                   Plan - 08/10/21 1015     Clinical Impression Statement Patient presents to clinic with excellent motivation to participate in therapy. Patient demonstrates deficits in posture, PFM extensibility, PFM coordination, nervous system regulation, and pain. Patient able to achieve decreased abdominal tenderness with manual interventions during today's session and responded positively to educational interventions. Patient will benefit from  continued skilled therapeutic intervention to address remaining deficits in posture, PFM extensibility, PFM coordination, nervous system regulation, and pain in order to increase function and improve overall QOL.    Personal Factors and Comorbidities Age;Behavior Pattern;Comorbidity 3+;Past/Current Experience;Time since onset of injury/illness/exacerbation    Comorbidities Sjogrens, lichen sclerosus hx, DM, diverticulosis, fibromyalgia, GERD, hyperlipidemia, chronic fatigue, anxiety and depression, memory difficulty, insomnia    Stability/Clinical Decision Making Evolving/Moderate complexity    Rehab Potential Fair    PT Frequency 1x / week    PT Duration 12 weeks    PT Treatment/Interventions ADLs/Self Care Home Management;Cryotherapy;Electrical Stimulation;Moist Heat;Therapeutic activities;Therapeutic exercise;Neuromuscular re-education;Patient/family education;Orthotic Fit/Training;Manual techniques;Scar mobilization;Taping;Spinal Manipulations;Joint Manipulations    PT Next Visit Plan postural exercises, hip flexor stretches, PFM assessment    Consulted and Agree with Plan of Care Patient             Patient will benefit from skilled therapeutic intervention in order to improve the following deficits and impairments:  Abnormal gait, Pain, Postural dysfunction, Improper body mechanics, Decreased strength, Decreased coordination, Increased muscle spasms, Decreased activity  tolerance  Visit Diagnosis: Pelvic pain  Abnormal posture  Abnormal coordination     Problem List Patient Active Problem List   Diagnosis Date Noted   Head congestion 08/08/2021   Abnormal SPEP 05/01/2021   Reactive airway disease 01/29/2021   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   Elevated LFTs 08/04/2020   Chronic heart failure with preserved ejection fraction (HFpEF) ([place]) 06/15/2020   Elevated aldolase level 04/28/2020   Tick bite of back 02/14/2020   Non-ischemic cardiomyopathy ([place]) 01/13/2020   History of chest pain 10/20/2019   Former smoker 08/30/2019   Restrictive lung disease 08/30/2019   Foot pain 02/16/2019   Hair loss 08/31/2018   Bilateral leg cramps 05/27/2018   Memory difficulty 04/27/2018   Iliotibial band syndrome 04/27/2018   Allergic rhinitis 01/26/2018   RUQ pain 10/28/2017   Obesity (BMI 30.0-34.9) 10/28/2017   Sinusitis 07/25/2017   Pain in limb 06/03/2017   Chest pain 01/17/2017   Throat fullness 10/15/2016   Primary biliary cirrhosis ([place]) 10/15/2016   Rash 08/08/2016   Lower abdominal pain 08/08/2016   Cephalalgia 01/26/2016   Dysuria 12/20/2015   Vitamin D deficiency 12/20/2015   Sjogren's syndrome ([place]) 12/20/2015   Chronic fatigue 12/04/2015   Insomnia 10/19/2015   Thrush 03/27/2015   Chronic bilateral low back pain without sciatica 11/07/2014   OSA on CPAP 10/19/2014   Anxiety and depression 08/16/2014   Allergic urticaria 06/02/2014   Dyspnea 05/04/2014   Vaginal yeast infection 07/06/2013   Type 2 diabetes mellitus ([place]) 07/06/2013   Malignant neoplasm of breast (female) ([place]) 02/28/2013   Blood clotting disorder ([place]) 12/01/2012   Palpitations 12/01/2012   Hyperlipidemia 12/01/2012   Chronic pain disorder 06/11/2012   Hearing loss 09/16/2011   Fibromyalgia 06/15/2011    Myles Gip PT, DPT [phone]  08/10/2021, 11:17 AM  [place] [address] [place], [place],  [address] Phone: [phone]   Fax:  [phone]  Name: Kristina Mccoy MRN: [id number] Date of Birth: 25-Mar-1956

## 2021-08-10 NOTE — Addendum Note (Signed)
Addended by: Caryl Bis Sabria Florido G on: 08/10/2021 11:32 AM   Modules accepted: Orders

## 2021-08-10 NOTE — Telephone Encounter (Signed)
I called and spoke with the patient and informed her that the provider sent in Celexa to her pharmacy to try and if she has any side effected to let us know and she understood.  Delayne Sanzo,cma

## 2021-08-13 ENCOUNTER — Encounter: Payer: Self-pay | Admitting: Family Medicine

## 2021-08-13 ENCOUNTER — Other Ambulatory Visit: Payer: Self-pay | Admitting: Family

## 2021-08-13 NOTE — Telephone Encounter (Signed)
RX Refill: xanax Last Seen: 08-09-21 Last Ordered: 07-13-21 Next Appt: 11-09-21

## 2021-08-14 ENCOUNTER — Encounter: Payer: Self-pay | Admitting: Family Medicine

## 2021-08-14 DIAGNOSIS — E119 Type 2 diabetes mellitus without complications: Secondary | ICD-10-CM | POA: Diagnosis not present

## 2021-08-14 LAB — HM DIABETES EYE EXAM

## 2021-08-16 ENCOUNTER — Other Ambulatory Visit: Payer: Self-pay

## 2021-08-16 ENCOUNTER — Ambulatory Visit: Payer: PPO | Admitting: Physical Therapy

## 2021-08-16 ENCOUNTER — Encounter: Payer: Self-pay | Admitting: Physical Therapy

## 2021-08-16 DIAGNOSIS — R102 Pelvic and perineal pain: Secondary | ICD-10-CM | POA: Diagnosis not present

## 2021-08-16 DIAGNOSIS — R293 Abnormal posture: Secondary | ICD-10-CM

## 2021-08-16 DIAGNOSIS — R278 Other lack of coordination: Secondary | ICD-10-CM

## 2021-08-16 NOTE — Therapy (Signed)
[place] [facility] [place] [address] [place], [place], [address] Phone: [phone]   Fax:  [phone]  Physical Therapy Treatment  Patient Details  Name: Kristina Mccoy MRN: [id number] Date of Birth: September 10, 1956 Referring Provider (PT): Hassan Buckler   Encounter Date: 08/16/2021   PT End of Session - 08/16/21 1211     Visit Number 3    Number of Visits 12    Date for PT Re-Evaluation 10/25/21    Authorization Type IE 08/02/2021    PT Start Time 1205    PT Stop Time 1245    PT Time Calculation (min) 40 min    Activity Tolerance Patient tolerated treatment well    Behavior During Therapy [facility] for tasks assessed/performed             Past Medical History:  Diagnosis Date   Abdominal pain, epigastric    Abnormal chest CT 09/08/2014   Allergic urticaria    Blood clotting disorder ([place])    Breast cancer ([place]) 5/14   left breast   Breast cancer ([place]) 02/2013   Dr Laurance Flatten ([employer])   Diabetes mellitus    Diverticulosis    Fibromyalgia    GERD (gastroesophageal reflux disease)    Heart murmur    Hyperlipidemia    Lymphedema of arm    right   Myalgia    Myositis    Neuromyositis    Otitis media, chronic    Pain syndrome, chronic    Peptic ulcer    Sicca syndrome ([place])    Sjogren's disease (H[facility])    Sleep apnea    SOB (shortness of breath)    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[place])     Past Surgical History:  Procedure Laterality Date   BREAST SURGERY Bilateral    mastectomy   COLONOSCOPY WITH PROPOFOL N/A 04/22/2017   Procedure: COLONOSCOPY WITH PROPOFOL;  Surgeon: Lollie Sails, MD;  Location: [facility] ENDOSCOPY;  Service: Endoscopy;  Laterality: N/A;   ESOPHAGOGASTRODUODENOSCOPY (EGD) WITH PROPOFOL N/A 04/22/2017   Procedure: ESOPHAGOGASTRODUODENOSCOPY (EGD) WITH PROPOFOL;  Surgeon: Lollie Sails, MD;  [facility];  Service: Endoscopy;  Laterality: N/A;   MASTECTOMY Bilateral 03/23/13     There were no vitals filed for this visit.   Subjective Assessment - 08/16/21 1206     Subjective Patient had increased hip and back pain with heel lift, B but decreased wear time which helped make it more tolerable. Patient has been doing bowel regimen with warm drink, bowel massage with reasonable effect. Patient notes that with bowel massage there is tenderness BUQ in the lateral portions.    Currently in Pain? Yes    Pain Score 4     Pain Location Generalized            TREATMENT  Manual Therapy: MFR of diaphragm with cued diaphragmatic breathing for improved excursion, IAP management, and pain modulation  Neuromuscular Re-education: Supine hooklying diaphragmatic breathing with VCs and TCs for downregulation of the nervous system and improved management of IAP Sidelying, thoracolumbar rotations (open-book) bilaterally with diaphragmatic breathing for improved diaphragmatic and rib cage excursion. VCs and TCs to prevent compensations. Supine hooklying trunk rotations for improved mobility and nervous system downregulation Patient education on strategies to downregulate the nervous system: dimming lights, soft/quiet environment, slow driving speed, slowed pace of breathing.   Patient educated throughout session on appropriate technique and form using multi-modal cueing, HEP, and activity modification. Patient articulated understanding and returned demonstration.  Patient  Response to interventions: Comfortable to trial exercises at home.  ASSESSMENT Patient presents to clinic with excellent motivation to participate in therapy. Patient demonstrates deficits in posture, PFM extensibility, PFM coordination, nervous system regulation, and pain. Patient with improved diaphragmatic excursion after manual interventions during today's session and responded positively to educational interventions. Patient will benefit from continued skilled therapeutic intervention to address  remaining deficits in posture, PFM extensibility, PFM coordination, nervous system regulation, and pain in order to increase function and improve overall QOL.    PT Short Term Goals - 10/31/15 1606       PT SHORT TERM GOAL #1   Title Patient will report an ODI score of less than 60% disability to demonstrate improved tolerance for functional mobility.     Baseline 86%    Time 3    Period Weeks    Status New               PT Long Term Goals - 08/02/21 1446       PT LONG TERM GOAL #1   Title Patient will demonstrate independence with HEP in order to maximize therapeutic gains and improve carryover from physical therapy sessions to ADLs in the home and community.    Baseline IE: not initiated    Time 12    Period Weeks    Status New    Target Date 10/25/21      PT LONG TERM GOAL #2   Title Patient will demonstrate coordinated lengthening and relaxation of PFM with diaphragmatic inhalation in order to decrease spasm and allow for unrestricted elimination of urine/feces for improved overall QOL.    Baseline IE: not demonstrated    Time 12    Period Weeks    Status New    Target Date 10/25/21      PT LONG TERM GOAL #3   Title Patient will demonstrate understanding of basic self-management/down-regulation of the nervous system for persistent pain condition and stress as evidenced by diaphragmatic breathing without cueing, body scan/progressive relaxation meditation, and improved sleep hygiene in order to transition to independent management of patient's chief complaint: persistent pelvic pain.    Baseline IE: not demonstrated    Time 12    Period Weeks    Status New    Target Date 10/25/21      PT LONG TERM GOAL #4   Title Patient will demonstrate improved function as evidenced by a score of 65 on FOTO Urinary measure for full participation in activities at home and in the community.    Baseline IE: 39    Time 12    Period Weeks    Status New    Target Date 10/25/21       PT LONG TERM GOAL #5   Title Patient will report nocturia to be <2x/night for greater than 4 weeks in order to improve sleep quality and match age-predicted norms.    Baseline IE: 2-4x/night    Time 12    Period Weeks    Status New    Target Date 10/25/21                   Plan - 08/16/21 1211     Clinical Impression Statement Patient presents to clinic with excellent motivation to participate in therapy. Patient demonstrates deficits in posture, PFM extensibility, PFM coordination, nervous system regulation, and pain. Patient with improved diaphragmatic excursion after manual interventions during today's session and responded positively to educational interventions. Patient will benefit from continued  skilled therapeutic intervention to address remaining deficits in posture, PFM extensibility, PFM coordination, nervous system regulation, and pain in order to increase function and improve overall QOL.    Personal Factors and Comorbidities Age;Behavior Pattern;Comorbidity 3+;Past/Current Experience;Time since onset of injury/illness/exacerbation    Comorbidities Sjogrens, lichen sclerosus hx, DM, diverticulosis, fibromyalgia, GERD, hyperlipidemia, chronic fatigue, anxiety and depression, memory difficulty, insomnia    Stability/Clinical Decision Making Evolving/Moderate complexity    Rehab Potential Fair    PT Frequency 1x / week    PT Duration 12 weeks    PT Treatment/Interventions ADLs/Self Care Home Management;Cryotherapy;Electrical Stimulation;Moist Heat;Therapeutic activities;Therapeutic exercise;Neuromuscular re-education;Patient/family education;Orthotic Fit/Training;Manual techniques;Scar mobilization;Taping;Spinal Manipulations;Joint Manipulations    PT Next Visit Plan postural exercises, hip flexor stretches, PFM assessment    Consulted and Agree with Plan of Care Patient             Patient will benefit from skilled therapeutic intervention in order to improve the  following deficits and impairments:  Abnormal gait, Pain, Postural dysfunction, Improper body mechanics, Decreased strength, Decreased coordination, Increased muscle spasms, Decreased activity tolerance  Visit Diagnosis: Pelvic pain  Abnormal posture  Abnormal coordination     Problem List Patient Active Problem List   Diagnosis Date Noted   Head congestion 08/08/2021   Abnormal SPEP 05/01/2021   Reactive airway disease 01/29/2021   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   Elevated LFTs 08/04/2020   Chronic heart failure with preserved ejection fraction (HFpEF) ([place]) 06/15/2020   Elevated aldolase level 04/28/2020   Tick bite of back 02/14/2020   Non-ischemic cardiomyopathy ([place]) 01/13/2020   History of chest pain 10/20/2019   Former smoker 08/30/2019   Restrictive lung disease 08/30/2019   Foot pain 02/16/2019   Hair loss 08/31/2018   Bilateral leg cramps 05/27/2018   Memory difficulty 04/27/2018   Iliotibial band syndrome 04/27/2018   Allergic rhinitis 01/26/2018   RUQ pain 10/28/2017   Obesity (BMI 30.0-34.9) 10/28/2017   Sinusitis 07/25/2017   Pain in limb 06/03/2017   Chest pain 01/17/2017   Throat fullness 10/15/2016   Primary biliary cirrhosis ([place]) 10/15/2016   Rash 08/08/2016   Lower abdominal pain 08/08/2016   Cephalalgia 01/26/2016   Dysuria 12/20/2015   Vitamin D deficiency 12/20/2015   Sjogren's syndrome ([place]) 12/20/2015   Chronic fatigue 12/04/2015   Insomnia 10/19/2015   Thrush 03/27/2015   Chronic bilateral low back pain without sciatica 11/07/2014   OSA on CPAP 10/19/2014   Anxiety and depression 08/16/2014   Allergic urticaria 06/02/2014   Dyspnea 05/04/2014   Vaginal yeast infection 07/06/2013   Type 2 diabetes mellitus ([place]) 07/06/2013   Malignant neoplasm of breast (female) ([place]) 02/28/2013   Blood clotting disorder ([place]) 12/01/2012   Palpitations 12/01/2012   Hyperlipidemia 12/01/2012   Chronic pain disorder 06/11/2012   Hearing loss 09/16/2011    Fibromyalgia 06/15/2011    Myles Gip PT, DPT [phone]  08/16/2021, 2:32 PM  [place] [facility] [address] [place], [place], [address] Phone: [phone]   Fax:  [phone]  Name: Keelia Graybill MRN: [id number] Date of Birth: [birth year]

## 2021-08-16 NOTE — Telephone Encounter (Signed)
See other mychart message.

## 2021-08-17 ENCOUNTER — Other Ambulatory Visit: Payer: Self-pay | Admitting: Family Medicine

## 2021-08-21 ENCOUNTER — Other Ambulatory Visit: Payer: Self-pay

## 2021-08-21 ENCOUNTER — Encounter: Payer: Self-pay | Admitting: Physical Therapy

## 2021-08-21 ENCOUNTER — Ambulatory Visit: Payer: PPO | Admitting: Physical Therapy

## 2021-08-21 DIAGNOSIS — R278 Other lack of coordination: Secondary | ICD-10-CM

## 2021-08-21 DIAGNOSIS — R102 Pelvic and perineal pain: Secondary | ICD-10-CM | POA: Diagnosis not present

## 2021-08-21 DIAGNOSIS — R293 Abnormal posture: Secondary | ICD-10-CM

## 2021-08-21 NOTE — Therapy (Signed)
[facility] [address] [place], [place], [address] Phone: [phone]   Fax:  [phone]  Physical Therapy Treatment  Patient Details  Name: Kristina Mccoy MRN: [id number] Date of Birth: [birth year] Referring Provider (PT): Hassan Buckler   Encounter Date: 08/21/2021   PT End of Session - 08/21/21 1159     Visit Number 4    Number of Visits 12    Date for PT Re-Evaluation 10/25/21    Authorization Type IE 08/02/2021    PT Start Time 1152    PT Stop Time 1240    PT Time Calculation (min) 48 min    Activity Tolerance Patient tolerated treatment well    Behavior During Therapy [facility] for tasks assessed/performed             Past Medical History:  Diagnosis Date   Abdominal pain, epigastric    Abnormal chest CT 09/08/2014   Allergic urticaria    Blood clotting disorder ([place])    Breast cancer ([place]) 5/14   left breast   Breast cancer ([place]) 02/2013   Dr Laurance Flatten ([place])   Diabetes mellitus    Diverticulosis    Fibromyalgia    GERD (gastroesophageal reflux disease)    Heart murmur    Hyperlipidemia    Lymphedema of arm    right   Myalgia    Myositis    Neuromyositis    Otitis media, chronic    Pain syndrome, chronic    Peptic ulcer    Sicca syndrome ([place])    Sjogren's disease (H[facility])    Sleep apnea    SOB (shortness of breath)    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[place])     Past Surgical History:  Procedure Laterality Date   BREAST SURGERY Bilateral    mastectomy   COLONOSCOPY WITH PROPOFOL N/A 04/22/2017   Procedure: COLONOSCOPY WITH PROPOFOL;  Surgeon: Lollie Sails, MD;  Location: [facility] ENDOSCOPY;  Service: Endoscopy;  Laterality: N/A;   ESOPHAGOGASTRODUODENOSCOPY (EGD) WITH PROPOFOL N/A 04/22/2017   Procedure: ESOPHAGOGASTRODUODENOSCOPY (EGD) WITH PROPOFOL;  Surgeon: Lollie Sails, MD;  Location: [facility] ENDOSCOPY;  Service: Endoscopy;  Laterality: N/A;   MASTECTOMY Bilateral 03/23/13     There were no vitals filed for this visit.   Subjective Assessment - 08/21/21 1152     Subjective Patient reports that she has ceased using heel lift because it was aggravating her back pain. She did try every other day use and reduced to one layer, but pain kept coming back. Patient reports that stretching exercises have been challenging with midback range of motion. Patient notes that she has much improved BMs since implementing bowel routine.    Currently in Pain? Yes    Pain Score 6     Pain Location Back    Pain Orientation Upper;Mid;Lower            TREATMENT  Pre-treatment assessment: EXTERNAL PELVIC EXAM:  Breath coordination: present Voluntary Contraction: present Relaxation: full Perineal movement with sustained IAP increase ("bear down"): inconsistent; descent with initial trial with significant abdominal compensations, elevation with second trial.  Neuromuscular Re-education: Supine hooklying diaphragmatic breathing with VCs and TCs for downregulation of the nervous system and improved management of IAP Supine hooklying, PFM lengthening with pursed lip exhalation/exhalation against pressure. VCs and TCs to decrease compensatory patterns and encourage optimal lengthening of the PFM. Supine hooklying, PFM contractions with exhalation. VCs and TCs to decrease compensatory patterns and encourage activation of the PFM.  Patient educated throughout session on appropriate technique and form using multi-modal cueing, HEP, and activity modification. Patient articulated understanding and returned demonstration.  Patient Response to interventions: Comfortable to work on PFM contractions  ASSESSMENT Patient presents to clinic with excellent motivation to participate in therapy. Patient demonstrates deficits in posture, PFM extensibility, PFM coordination, nervous system regulation, and pain. Patient requiring increased cueing and focus to coordinate PFM eccentric during  today's session. Patient with some moderate success utilizing back-pressure technique with breath and cue to "bear down" but ultimately PFM strength deficit is limiting factor with ability to successfully and consistently sustain eccentric contraction for defecation. Patient will benefit from continued skilled therapeutic intervention to address remaining deficits in posture, PFM extensibility, PFM coordination, nervous system regulation, and pain in order to increase function and improve overall QOL.           PT Long Term Goals - 08/02/21 1446       PT LONG TERM GOAL #1   Title Patient will demonstrate independence with HEP in order to maximize therapeutic gains and improve carryover from physical therapy sessions to ADLs in the home and community.    Baseline IE: not initiated    Time 12    Period Weeks    Status New    Target Date 10/25/21      PT LONG TERM GOAL #2   Title Patient will demonstrate coordinated lengthening and relaxation of PFM with diaphragmatic inhalation in order to decrease spasm and allow for unrestricted elimination of urine/feces for improved overall QOL.    Baseline IE: not demonstrated    Time 12    Period Weeks    Status New    Target Date 10/25/21      PT LONG TERM GOAL #3   Title Patient will demonstrate understanding of basic self-management/down-regulation of the nervous system for persistent pain condition and stress as evidenced by diaphragmatic breathing without cueing, body scan/progressive relaxation meditation, and improved sleep hygiene in order to transition to independent management of patient's chief complaint: persistent pelvic pain.    Baseline IE: not demonstrated    Time 12    Period Weeks    Status New    Target Date 10/25/21      PT LONG TERM GOAL #4   Title Patient will demonstrate improved function as evidenced by a score of 65 on FOTO Urinary measure for full participation in activities at home and in the community.     Baseline IE: 59    Time 12    Period Weeks    Status New    Target Date 10/25/21      PT LONG TERM GOAL #5   Title Patient will report nocturia to be <2x/night for greater than 4 weeks in order to improve sleep quality and match age-predicted norms.    Baseline IE: 2-4x/night    Time 12    Period Weeks    Status New    Target Date 10/25/21                   Plan - 08/21/21 1200     Clinical Impression Statement posture, PFM extensibility, PFM coordination, nervous system regulation, and pain. Patient requiring increased cueing and focus to coordinate PFM eccentric during today's session. Patient with some moderate success utilizing back-pressure technique with breath and cue to "bear down" but ultimately PFM strength deficit is limiting factor with ability to successfully and consistently sustain eccentric contraction for defecation. Patient will benefit  from continued skilled therapeutic intervention to address remaining deficits in posture, PFM extensibility, PFM coordination, nervous system regulation, and pain in order to increase function and improve overall QOL.    Personal Factors and Comorbidities Age;Behavior Pattern;Comorbidity 3+;Past/Current Experience;Time since onset of injury/illness/exacerbation    Comorbidities Sjogrens, lichen sclerosus hx, DM, diverticulosis, fibromyalgia, GERD, hyperlipidemia, chronic fatigue, anxiety and depression, memory difficulty, insomnia    Stability/Clinical Decision Making Evolving/Moderate complexity    Rehab Potential Fair    PT Frequency 1x / week    PT Duration 12 weeks    PT Treatment/Interventions ADLs/Self Care Home Management;Cryotherapy;Electrical Stimulation;Moist Heat;Therapeutic activities;Therapeutic exercise;Neuromuscular re-education;Patient/family education;Orthotic Fit/Training;Manual techniques;Scar mobilization;Taping;Spinal Manipulations;Joint Manipulations    PT Next Visit Plan postural exercises, hip flexor  stretches, PFM assessment    Consulted and Agree with Plan of Care Patient             Patient will benefit from skilled therapeutic intervention in order to improve the following deficits and impairments:  Abnormal gait, Pain, Postural dysfunction, Improper body mechanics, Decreased strength, Decreased coordination, Increased muscle spasms, Decreased activity tolerance  Visit Diagnosis: Pelvic pain  Abnormal posture  Abnormal coordination     Problem List Patient Active Problem List   Diagnosis Date Noted   Head congestion 08/08/2021   Abnormal SPEP 05/01/2021   Reactive airway disease 01/29/2021   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   Elevated LFTs 08/04/2020   Chronic heart failure with preserved ejection fraction (HFpEF) ([place]) 06/15/2020   Elevated aldolase level 04/28/2020   Tick bite of back 02/14/2020   Non-ischemic cardiomyopathy ([place]) 01/13/2020   History of chest pain 10/20/2019   Former smoker 08/30/2019   Restrictive lung disease 08/30/2019   Foot pain 02/16/2019   Hair loss 08/31/2018   Bilateral leg cramps 05/27/2018   Memory difficulty 04/27/2018   Iliotibial band syndrome 04/27/2018   Allergic rhinitis 01/26/2018   RUQ pain 10/28/2017   Obesity (BMI 30.0-34.9) 10/28/2017   Sinusitis 07/25/2017   Pain in limb 06/03/2017   Chest pain 01/17/2017   Throat fullness 10/15/2016   Primary biliary cirrhosis ([place]) 10/15/2016   Rash 08/08/2016   Lower abdominal pain 08/08/2016   Cephalalgia 01/26/2016   Dysuria 12/20/2015   Vitamin D deficiency 12/20/2015   Sjogren's syndrome ([place]) 12/20/2015   Chronic fatigue 12/04/2015   Insomnia 10/19/2015   Thrush 03/27/2015   Chronic bilateral low back pain without sciatica 11/07/2014   OSA on CPAP 10/19/2014   Anxiety and depression 08/16/2014   Allergic urticaria 06/02/2014   Dyspnea 05/04/2014   Vaginal yeast infection 07/06/2013   Type 2 diabetes mellitus ([place]) 07/06/2013   Malignant neoplasm of breast (female)  ([place]) 02/28/2013   Blood clotting disorder ([place]) 12/01/2012   Palpitations 12/01/2012   Hyperlipidemia 12/01/2012   Chronic pain disorder 06/11/2012   Hearing loss 09/16/2011   Fibromyalgia 06/15/2011    Myles Gip PT, DPT [phone]  08/21/2021, 2:14 PM  [facility] [address]. [place], [place], [address] Phone: [phone]   Fax:  [phone]  Name: Rhyann Berton MRN: [id number] Date of Birth: [birth year]

## 2021-08-27 DIAGNOSIS — E1165 Type 2 diabetes mellitus with hyperglycemia: Secondary | ICD-10-CM | POA: Diagnosis not present

## 2021-08-27 DIAGNOSIS — Z6828 Body mass index (BMI) 28.0-28.9, adult: Secondary | ICD-10-CM | POA: Diagnosis not present

## 2021-08-30 ENCOUNTER — Other Ambulatory Visit: Payer: Self-pay

## 2021-08-30 ENCOUNTER — Ambulatory Visit: Payer: PPO | Attending: Obstetrics and Gynecology | Admitting: Physical Therapy

## 2021-08-30 ENCOUNTER — Encounter: Payer: Self-pay | Admitting: Physical Therapy

## 2021-08-30 DIAGNOSIS — R293 Abnormal posture: Secondary | ICD-10-CM | POA: Diagnosis not present

## 2021-08-30 DIAGNOSIS — R102 Pelvic and perineal pain: Secondary | ICD-10-CM | POA: Insufficient documentation

## 2021-08-30 DIAGNOSIS — R278 Other lack of coordination: Secondary | ICD-10-CM | POA: Insufficient documentation

## 2021-08-30 NOTE — Therapy (Signed)
[place] [facility] [address]. [place], [place], [address] Phone: [phone]   Fax:  [phone]  Physical Therapy Treatment  Patient Details  Name: Kristina Mccoy MRN: [id number] Date of Birth: Aug 07, 1956 Referring Provider (PT): Hassan Buckler   Encounter Date: 08/30/2021   PT End of Session - 08/30/21 1152     Visit Number 5    Number of Visits 12    Date for PT Re-Evaluation 10/25/21    Authorization Type IE 08/02/2021    PT Start Time 1155    PT Stop Time 1235    PT Time Calculation (min) 40 min    Activity Tolerance Patient tolerated treatment well    Behavior During Therapy [facility] for tasks assessed/performed             Past Medical History:  Diagnosis Date   Abdominal pain, epigastric    Abnormal chest CT 09/08/2014   Allergic urticaria    Blood clotting disorder ([place])    Breast cancer ([place]) 5/14   left breast   Breast cancer ([place]) 02/2013   Dr Laurance Flatten ([place])   Diabetes mellitus    Diverticulosis    Fibromyalgia    GERD (gastroesophageal reflux disease)    Heart murmur    Hyperlipidemia    Lymphedema of arm    right   Myalgia    Myositis    Neuromyositis    Otitis media, chronic    Pain syndrome, chronic    Peptic ulcer    Sicca syndrome ([place])    Sjogren's disease (HCC)    Sleep apnea    SOB (shortness of breath)    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 ENDOSCOPY;  Service: Endoscopy;  Laterality: N/A;   ESOPHAGOGASTRODUODENOSCOPY (EGD) WITH PROPOFOL N/A 04/22/2017   Procedure: ESOPHAGOGASTRODUODENOSCOPY (EGD) WITH PROPOFOL;  Surgeon: Lollie Sails, MD;  Location: [facility] ENDOSCOPY;  Service: Endoscopy;  Laterality: N/A;   MASTECTOMY Bilateral 03/23/13     There were no vitals filed for this visit.   Subjective Assessment - 08/30/21 1155     Subjective Patient notes that she has been in a lot of pain since Monday. After last session, patient got confused about HEP and was unsure about PFM exercises. Patient rates current pain at 6/10 and locates pain to bones/joints. Patient reports this is likely a Sjogren's flare up.              TREATMENT  Neuromuscular Re-education: Supine hooklying diaphragmatic breathing with VCs and TCs for downregulation of the nervous system and improved management of IAP Supine hooklying, PFM lengthening with progressively distal hand placement for improved diaphragmatic excursion and abdominal expansion. VCs and TCs to decrease compensatory patterns and encourage optimal lengthening of the PFM. Supine hooklying, PFM contractions. VCs and TCs to decrease compensatory patterns and encourage activation of the PFM.   Patient educated throughout session on appropriate technique and form using multi-modal cueing, HEP, and activity modification. Patient articulated understanding and returned demonstration.  Patient Response to interventions: Reports better understanding of HEP  ASSESSMENT Patient presents to clinic with excellent motivation to participate in therapy. Patient demonstrates deficits in posture, PFM extensibility, PFM coordination, nervous system regulation, and pain. Patient able to consistently perform diaphragmatic breathing but  did have diaphragm spasm/cramp on R with increased repetitions during today's session. Patient responded well to verbal cue/visual imagery to coordinate PFM contraction. Patient will benefit from continued skilled therapeutic intervention to address remaining deficits in posture, PFM extensibility, PFM coordination, nervous system regulation, and pain in order to increase function and improve overall QOL.     PT Long Term Goals - 08/02/21 1446       PT LONG TERM GOAL  #1   Title Patient will demonstrate independence with HEP in order to maximize therapeutic gains and improve carryover from physical therapy sessions to ADLs in the home and community.    Baseline IE: not initiated    Time 12    Period Weeks    Status New    Target Date 10/25/21      PT LONG TERM GOAL #2   Title Patient will demonstrate coordinated lengthening and relaxation of PFM with diaphragmatic inhalation in order to decrease spasm and allow for unrestricted elimination of urine/feces for improved overall QOL.    Baseline IE: not demonstrated    Time 12    Period Weeks    Status New    Target Date 10/25/21      PT LONG TERM GOAL #3   Title Patient will demonstrate understanding of basic self-management/down-regulation of the nervous system for persistent pain condition and stress as evidenced by diaphragmatic breathing without cueing, body scan/progressive relaxation meditation, and improved sleep hygiene in order to transition to independent management of patient's chief complaint: persistent pelvic pain.    Baseline IE: not demonstrated    Time 12    Period Weeks    Status New    Target Date 10/25/21      PT LONG TERM GOAL #4   Title Patient will demonstrate improved function as evidenced by a score of 65 on FOTO Urinary measure for full participation in activities at home and in the community.    Baseline IE: 73    Time 12    Period Weeks    Status New    Target Date 10/25/21      PT LONG TERM GOAL #5   Title Patient will report nocturia to be <2x/night for greater than 4 weeks in order to improve sleep quality and match age-predicted norms.    Baseline IE: 2-4x/night    Time 12    Period Weeks    Status New    Target Date 10/25/21                   Plan - 08/30/21 1152     Clinical Impression Statement Patient presents to clinic with excellent motivation to participate in therapy. Patient demonstrates deficits in posture, PFM extensibility, PFM  coordination, nervous system regulation, and pain. Patient able to consistently perform diaphragmatic breathing but did have diaphragm spasm/cramp on R with increased repetitions during today's session. Patient responded well to verbal cue/visual imagery to coordinate PFM contraction. Patient will benefit from continued skilled therapeutic intervention to address remaining deficits in posture, PFM extensibility, PFM coordination, nervous system regulation, and pain in order to increase function and improve overall QOL.    Personal Factors and Comorbidities Age;Behavior Pattern;Comorbidity 3+;Past/Current Experience;Time since onset of injury/illness/exacerbation    Comorbidities Sjogrens, lichen sclerosus hx, DM, diverticulosis, fibromyalgia, GERD, hyperlipidemia, chronic fatigue, anxiety and depression, memory difficulty, insomnia    Stability/Clinical Decision Making Evolving/Moderate complexity    Rehab Potential Fair    PT Frequency 1x / week    PT  Duration 12 weeks    PT Treatment/Interventions ADLs/Self Care Home Management;Cryotherapy;Electrical Stimulation;Moist Heat;Therapeutic activities;Therapeutic exercise;Neuromuscular re-education;Patient/family education;Orthotic Fit/Training;Manual techniques;Scar mobilization;Taping;Spinal Manipulations;Joint Manipulations    PT Next Visit Plan postural exercises, hip flexor stretches, PFM assessment    Consulted and Agree with Plan of Care Patient             Patient will benefit from skilled therapeutic intervention in order to improve the following deficits and impairments:  Abnormal gait, Pain, Postural dysfunction, Improper body mechanics, Decreased strength, Decreased coordination, Increased muscle spasms, Decreased activity tolerance  Visit Diagnosis: Pelvic pain  Abnormal posture  Abnormal coordination     Problem List Patient Active Problem List   Diagnosis Date Noted   Head congestion 08/08/2021   Abnormal SPEP 05/01/2021    Reactive airway disease 01/29/2021   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   Elevated LFTs 08/04/2020   Chronic heart failure with preserved ejection fraction (HFpEF) ([place]) 06/15/2020   Elevated aldolase level 04/28/2020   Tick bite of back 02/14/2020   Non-ischemic cardiomyopathy ([place]) 01/13/2020   History of chest pain 10/20/2019   Former smoker 08/30/2019   Restrictive lung disease 08/30/2019   Foot pain 02/16/2019   Hair loss 08/31/2018   Bilateral leg cramps 05/27/2018   Memory difficulty 04/27/2018   Iliotibial band syndrome 04/27/2018   Allergic rhinitis 01/26/2018   RUQ pain 10/28/2017   Obesity (BMI 30.0-34.9) 10/28/2017   Sinusitis 07/25/2017   Pain in limb 06/03/2017   Chest pain 01/17/2017   Throat fullness 10/15/2016   Primary biliary cirrhosis ([place]) 10/15/2016   Rash 08/08/2016   Lower abdominal pain 08/08/2016   Cephalalgia 01/26/2016   Dysuria 12/20/2015   Vitamin D deficiency 12/20/2015   Sjogren's syndrome ([place]) 12/20/2015   Chronic fatigue 12/04/2015   Insomnia 10/19/2015   Thrush 03/27/2015   Chronic bilateral low back pain without sciatica 11/07/2014   OSA on CPAP 10/19/2014   Anxiety and depression 08/16/2014   Allergic urticaria 06/02/2014   Dyspnea 05/04/2014   Vaginal yeast infection 07/06/2013   Type 2 diabetes mellitus ([place]) 07/06/2013   Malignant neoplasm of breast (female) ([place]) 02/28/2013   Blood clotting disorder ([place]) 12/01/2012   Palpitations 12/01/2012   Hyperlipidemia 12/01/2012   Chronic pain disorder 06/11/2012   Hearing loss 09/16/2011   Fibromyalgia 06/15/2011    Myles Gip PT, DPT [phone]  08/30/2021, 1:51 PM  [facility] [address]. [place], [place], [address] Phone: [phone]   Fax:  [phone]  Name: Kristina Mccoy MRN: [id number] Date of Birth: 17-Feb-1956

## 2021-09-06 ENCOUNTER — Encounter: Payer: Self-pay | Admitting: Physical Therapy

## 2021-09-06 ENCOUNTER — Other Ambulatory Visit: Payer: Self-pay

## 2021-09-06 ENCOUNTER — Ambulatory Visit: Payer: PPO | Admitting: Physical Therapy

## 2021-09-06 DIAGNOSIS — R102 Pelvic and perineal pain: Secondary | ICD-10-CM | POA: Diagnosis not present

## 2021-09-06 DIAGNOSIS — R293 Abnormal posture: Secondary | ICD-10-CM

## 2021-09-06 DIAGNOSIS — R278 Other lack of coordination: Secondary | ICD-10-CM

## 2021-09-06 NOTE — Therapy (Signed)
[facility] [address] [place], [place], [address] Phone: [phone]   Fax:  [phone]  Physical Therapy Treatment  Patient Details  Name: Kristina Mccoy MRN: [id number] Date of Birth: 05/10/1956 Referring Provider (PT): Hassan Buckler   Encounter Date: 09/06/2021   PT End of Session - 09/06/21 1217     Visit Number 6    Number of Visits 12    Date for PT Re-Evaluation 10/25/21    Authorization Type IE 08/02/2021    PT Start Time 1215    PT Stop Time 1240    PT Time Calculation (min) 25 min    Activity Tolerance Patient tolerated treatment well    Behavior During Therapy [facility] for tasks assessed/performed             Past Medical History:  Diagnosis Date   Abdominal pain, epigastric    Abnormal chest CT 09/08/2014   Allergic urticaria    Blood clotting disorder ([place])    Breast cancer ([place]) 5/14   left breast   Breast cancer ([place]) 02/2013   Dr Laurance Flatten ([place])   Diabetes mellitus    Diverticulosis    Fibromyalgia    GERD (gastroesophageal reflux disease)    Heart murmur    Hyperlipidemia    Lymphedema of arm    right   Myalgia    Myositis    Neuromyositis    Otitis media, chronic    Pain syndrome, chronic    Peptic ulcer    Sicca syndrome ([place])    Sjogren's disease (H[facility])    Sleep apnea    SOB (shortness of breath)    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[place])     Past Surgical History:  Procedure Laterality Date   BREAST SURGERY Bilateral    mastectomy   COLONOSCOPY WITH PROPOFOL N/A 04/22/2017   Procedure: COLONOSCOPY WITH PROPOFOL;  Surgeon: Lollie Sails, MD;  Location: [facility] ENDOSCOPY;  Service: Endoscopy;  Laterality: N/A;   ESOPHAGOGASTRODUODENOSCOPY (EGD) WITH PROPOFOL N/A 04/22/2017   Procedure: ESOPHAGOGASTRODUODENOSCOPY (EGD) WITH PROPOFOL;  Surgeon: Lollie Sails, MD;  Location: [facility] ENDOSCOPY;  Service: Endoscopy;  Laterality: N/A;   MASTECTOMY Bilateral 03/23/13     There were no vitals filed for this visit.   Subjective Assessment - 09/06/21 1215     Subjective Ptient presents to clinic 15 minutes late for appointment. Patient notes she remembered how to do all of the exercises but continues to feel spasm in diaphragm B.    Currently in Pain? Yes    Pain Score 5     Pain Location Generalized             TREATMENT  Manual Therapy: MFR of diaphragm with coordinated breath for improved diaphragmatic excursion and subsequently improved IAP management   Patient educated throughout session on appropriate technique and form using multi-modal cueing, HEP, and activity modification. Patient articulated understanding and returned demonstration.  Patient Response to interventions: Comfortable to add in open book with coordinated breath  ASSESSMENT Patient presents to clinic with excellent motivation to participate in therapy. Patient demonstrates deficits in posture, PFM extensibility, PFM coordination, nervous system regulation, and pain. Patient with improved diaphragm excursion after manual intervention during today's session and had no spasm of diaphragm nor abdominals with technique. Patient will benefit from continued skilled therapeutic intervention to address remaining deficits in posture, PFM extensibility, PFM coordination, nervous system regulation, and pain in order to increase function and improve overall QOL.  PT Long Term Goals - 08/02/21 1446       PT LONG TERM GOAL #1   Title Patient will demonstrate independence with HEP in order to maximize therapeutic gains and improve carryover from physical therapy sessions to ADLs in the home and community.    Baseline IE: not initiated    Time 12    Period Weeks    Status New    Target Date 10/25/21      PT LONG TERM GOAL #2   Title Patient will demonstrate coordinated lengthening and relaxation of PFM with diaphragmatic inhalation in order to decrease spasm and allow for  unrestricted elimination of urine/feces for improved overall QOL.    Baseline IE: not demonstrated    Time 12    Period Weeks    Status New    Target Date 10/25/21      PT LONG TERM GOAL #3   Title Patient will demonstrate understanding of basic self-management/down-regulation of the nervous system for persistent pain condition and stress as evidenced by diaphragmatic breathing without cueing, body scan/progressive relaxation meditation, and improved sleep hygiene in order to transition to independent management of patient's chief complaint: persistent pelvic pain.    Baseline IE: not demonstrated    Time 12    Period Weeks    Status New    Target Date 10/25/21      PT LONG TERM GOAL #4   Title Patient will demonstrate improved function as evidenced by a score of 65 on FOTO Urinary measure for full participation in activities at home and in the community.    Baseline IE: 48    Time 12    Period Weeks    Status New    Target Date 10/25/21      PT LONG TERM GOAL #5   Title Patient will report nocturia to be <2x/night for greater than 4 weeks in order to improve sleep quality and match age-predicted norms.    Baseline IE: 2-4x/night    Time 12    Period Weeks    Status New    Target Date 10/25/21                   Plan - 09/06/21 1218     Clinical Impression Statement Patient presents to clinic with excellent motivation to participate in therapy. Patient demonstrates deficits in posture, PFM extensibility, PFM coordination, nervous system regulation, and pain. Patient with improved diaphragm excursion after manual intervention during today's session and had no spasm of diaphragm nor abdominals with technique. Patient will benefit from continued skilled therapeutic intervention to address remaining deficits in posture, PFM extensibility, PFM coordination, nervous system regulation, and pain in order to increase function and improve overall QOL.    Personal Factors and  Comorbidities Age;Behavior Pattern;Comorbidity 3+;Past/Current Experience;Time since onset of injury/illness/exacerbation    Comorbidities Sjogrens, lichen sclerosus hx, DM, diverticulosis, fibromyalgia, GERD, hyperlipidemia, chronic fatigue, anxiety and depression, memory difficulty, insomnia    Stability/Clinical Decision Making Evolving/Moderate complexity    Rehab Potential Fair    PT Frequency 1x / week    PT Duration 12 weeks    PT Treatment/Interventions ADLs/Self Care Home Management;Cryotherapy;Electrical Stimulation;Moist Heat;Therapeutic activities;Therapeutic exercise;Neuromuscular re-education;Patient/family education;Orthotic Fit/Training;Manual techniques;Scar mobilization;Taping;Spinal Manipulations;Joint Manipulations    PT Next Visit Plan postural exercises, hip flexor stretches, PFM assessment    Consulted and Agree with Plan of Care Patient             Patient will benefit from skilled therapeutic intervention in order  to improve the following deficits and impairments:  Abnormal gait, Pain, Postural dysfunction, Improper body mechanics, Decreased strength, Decreased coordination, Increased muscle spasms, Decreased activity tolerance  Visit Diagnosis: Pelvic pain  Abnormal posture  Abnormal coordination     Problem List Patient Active Problem List   Diagnosis Date Noted   Head congestion 08/08/2021   Abnormal SPEP 05/01/2021   Reactive airway disease 01/29/2021   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   Elevated LFTs 08/04/2020   Chronic heart failure with preserved ejection fraction (HFpEF) ([place]) 06/15/2020   Elevated aldolase level 04/28/2020   Tick bite of back 02/14/2020   Non-ischemic cardiomyopathy ([place]) 01/13/2020   History of chest pain 10/20/2019   Former smoker 08/30/2019   Restrictive lung disease 08/30/2019   Foot pain 02/16/2019   Hair loss 08/31/2018   Bilateral leg cramps 05/27/2018   Memory difficulty 04/27/2018   Iliotibial band syndrome  04/27/2018   Allergic rhinitis 01/26/2018   RUQ pain 10/28/2017   Obesity (BMI 30.0-34.9) 10/28/2017   Sinusitis 07/25/2017   Pain in limb 06/03/2017   Chest pain 01/17/2017   Throat fullness 10/15/2016   Primary biliary cirrhosis ([place]) 10/15/2016   Rash 08/08/2016   Lower abdominal pain 08/08/2016   Cephalalgia 01/26/2016   Dysuria 12/20/2015   Vitamin D deficiency 12/20/2015   Sjogren's syndrome ([place]) 12/20/2015   Chronic fatigue 12/04/2015   Insomnia 10/19/2015   Thrush 03/27/2015   Chronic bilateral low back pain without sciatica 11/07/2014   OSA on CPAP 10/19/2014   Anxiety and depression 08/16/2014   Allergic urticaria 06/02/2014   Dyspnea 05/04/2014   Vaginal yeast infection 07/06/2013   Type 2 diabetes mellitus ([place]) 07/06/2013   Malignant neoplasm of breast (female) ([place]) 02/28/2013   Blood clotting disorder ([place]) 12/01/2012   Palpitations 12/01/2012   Hyperlipidemia 12/01/2012   Chronic pain disorder 06/11/2012   Hearing loss 09/16/2011   Fibromyalgia 06/15/2011    Myles Gip PT, DPT [phone]  09/06/2021, 1:45 PM  [facility] [address] [place], [place], [address] Phone: [phone]   Fax:  [phone]  Name: Kristina Mccoy MRN: [id number] Date of Birth: 04-07-1956

## 2021-09-10 ENCOUNTER — Other Ambulatory Visit: Payer: Self-pay | Admitting: Family Medicine

## 2021-09-10 ENCOUNTER — Ambulatory Visit: Payer: PPO | Admitting: Pharmacist

## 2021-09-10 ENCOUNTER — Telehealth: Payer: Self-pay | Admitting: Pharmacist

## 2021-09-10 DIAGNOSIS — M3500 Sicca syndrome, unspecified: Secondary | ICD-10-CM

## 2021-09-10 DIAGNOSIS — E119 Type 2 diabetes mellitus without complications: Secondary | ICD-10-CM

## 2021-09-10 DIAGNOSIS — M797 Fibromyalgia: Secondary | ICD-10-CM

## 2021-09-10 DIAGNOSIS — E785 Hyperlipidemia, unspecified: Secondary | ICD-10-CM

## 2021-09-10 DIAGNOSIS — Z794 Long term (current) use of insulin: Secondary | ICD-10-CM

## 2021-09-10 MED ORDER — BASAGLAR KWIKPEN 100 UNIT/ML ~~LOC~~ SOPN: PEN_INJECTOR | SUBCUTANEOUS | 0 refills | Status: DC

## 2021-09-10 MED ORDER — INSULIN ASPART 100 UNIT/ML FLEXPEN: 12.0000 [IU] | PEN_INJECTOR | Freq: Three times a day (TID) | SUBCUTANEOUS | 0 refills | Status: DC

## 2021-09-10 NOTE — Patient Instructions (Addendum)
Visit Information  Following are the goals we discussed today:  Patient Goals/Self-Care Activities Over the next 90 days, patient will:  - take medications as prescribed check glucose three times daily using CGM, document, and provide at future appointments collaborate with provider on medication access solutions        Plan: Telephone follow up appointment with care management team member scheduled for:  pending medication access needs   Catie Darnelle Maffucci, PharmD, [place], CPP Clinical Pharmacist [place] at [facility] [phone]   Please call the care guide team at [phone] if you need to cancel or reschedule your appointment.   Patient verbalizes understanding of instructions provided today and agrees to view in [place].

## 2021-09-10 NOTE — Telephone Encounter (Signed)
Noted. Agree with change to basaglar. It looks like she tried to call back.

## 2021-09-10 NOTE — Telephone Encounter (Signed)
RX Refill: xanax Last Seen: 08-08-21 Last Ordered: 08-13-21 Next Appt: 11-09-21

## 2021-09-10 NOTE — Telephone Encounter (Signed)
Returned call. See CCM

## 2021-09-10 NOTE — Telephone Encounter (Signed)
Spoke with Dr. Caryl Comes, endocrinology. Discussed transition to [place] given patient reported intolerance of Tresiba. Patient can receive Engineer, agricultural through [employer] patient assistance.   Called patient, left message for her to return my call so we can discuss process to apply for [place] assistance through [employer] and reapply for [employer] assistance through [employer]

## 2021-09-10 NOTE — Chronic Care Management (AMB) (Signed)
Chronic Care Management CCM Pharmacy Note  09/10/2021 Name:  Kristina Mccoy MRN:  [id number] DOB:  [birth year]  Summary: - Collaborated with Dr. Caryl Comes. Switch from [place] to [place]   Recommendations/Changes made from today's visit: - Applying for patient assistance for 2023  Subjective: Kristina Mccoy is an 65 y.o. year old female who is a primary patient of Caryl Bis, Angela Adam, MD.  The CCM team was consulted for assistance with disease management and care coordination needs.    Engaged with patient by telephone for follow up visit for pharmacy case management and/or care coordination services.   Objective:  Medications Reviewed Today     Reviewed by Louie Casa, PT (Physical Therapist) on 09/06/21 at 1215  Med List Status: <None>   Medication Order Taking? Sig Documenting Provider Last Dose Status Informant  albuterol (VENTOLIN HFA) 108 (90 Base) MCG/ACT inhaler [id number] No Inhale 2 puffs into the lungs every 4 (four) hours as needed for wheezing or shortness of breath. Sharion Balloon, NP Taking Active            Med Note Nat Christen Mar 01, 2021  1:40 PM) Using ~ 3 times weekly  ALPRAZolam (XANAX) 0.5 MG tablet [id number]  TAKE 1 TABLET (0.5 MG TOTAL) BY MOUTH 4 (FOUR) TIMES DAILY AS NEEDED FOR ANXIETY. [name], MD  Active   aspirin EC 81 MG tablet [id number] No Take 1 tablet (81 mg total) by mouth daily. End, Harrell Gave, MD Taking Active   Azelastine HCl 137 MCG/SPRAY SOLN [id number] No PLACE 2 SPRAYS INTO BOTH NOSTRILS 2 (TWO) TIMES DAILY. USE IN EACH NOSTRIL AS DIRECTED [name], MD Taking Active   Biotin 10 MG TABS [id number] No Take by mouth. [provider] Taking Active   BREO ELLIPTA 200-25 MCG/INH AEPB [id number] No Inhale 1 puff into the lungs daily. [provider] Taking Active   Cholecalciferol (VITAMIN D3) 1000 units CAPS [id number] No Take by mouth daily.  [provider] Taking Active             Med Note ([facility] [place], BRANDY L   Wed Jan 12, 2020  1:21 PM)    clindamycin (CLEOCIN) 300 MG capsule [id number] No Take 1 capsule (300 mg total) by mouth 3 (three) times daily. [name], MD Taking Active   clobetasol ointment (TEMOVATE) 0.05 % [id number] No Apply topically. Vaginally [provider] Taking Active            Med Note Mayo Ao Sep 18, 2020  1:46 PM)    clotrimazole (MYCELEX) 10 MG troche [id number] No Take 10 mg by mouth 5 (five) times daily. [provider] Taking Active   Continuous Blood Gluc Sensor (FREESTYLE LIBRE 14 DAY SENSOR) [place] [id number] No SMARTSIG:1 Each Topical Every 2 Weeks [provider] Taking Active   Cyanocobalamin 1000 MCG CAPS [id number] No Take by mouth. Takes occassionally [provider] Taking Active   donepezil (ARICEPT) 5 MG tablet [id number] No Take 5 mg by mouth at bedtime. [provider] Taking Active   Emollient (CERAVE) CREA [id number] No Apply topically daily.  [provider] Taking Active   famotidine (PEPCID) 20 MG tablet [id number] No Take 1 tablet (20 mg total) by mouth at bedtime for 14 days.  Patient taking differently: Take 20 mg by mouth daily. LRN   Jodelle Green, FNP Taking Expired 06/14/20 2359   ibuprofen (ADVIL) 200 MG tablet  [id number] No Take 200 mg by mouth every 6 (six) hours as needed. [provider] Taking Active   insulin aspart (NOVOLOG) 100 UNIT/ML FlexPen [id number] No Sliding scale up to three times daily with meals per Endocrinology [provider] Taking Active   ketoconazole (NIZORAL) 2 % cream [id number] No Apply 1 application topically 2 (two) times daily as needed. [provider] Taking Active   ketoconazole (NIZORAL) 2 % shampoo [id number] No Apply 1 application topically as needed. [provider] Taking Active   montelukast (SINGULAIR) 10 MG tablet [id number] No TAKE 1 TABLET BY MOUTH EVERYDAY AT  BEDTIME [name], MD Taking Active   ondansetron (ZOFRAN) 4 MG tablet [id number] No TAKE 1 TABLET BY MOUTH EVERY 8 HOURS AS NEEDED FOR NAUSEA AND VOMITING [name], MD Taking Active   pilocarpine (SALAGEN) 5 MG tablet [id number] No Take 5 mg by mouth 3 (three) times daily. [provider] Taking Active   Polyethyl Glycol-Propyl Glycol (SYSTANE OP) [id number] No Apply to eye. [provider] Taking Active   pseudoephedrine-guaifenesin (MUCINEX D) 60-600 MG 12 hr tablet [id number] No Take 1 tablet by mouth 2 (two) times daily as needed.  [provider] Taking Active   rOPINIRole (REQUIP) 0.25 MG tablet [id number] No Take 0.25 mg by mouth daily as needed. [provider] Taking Active   tamoxifen (NOLVADEX) 20 MG tablet [id number] No Take 20 mg by mouth daily. [provider] Taking Active   traMADol (ULTRAM) 50 MG tablet [id number] No TAKE 1 TABLET BY MOUTH EVERY 6 HOURS AS NEEDED FOR PAIN [name], MD Taking Active   traZODone (DESYREL) 50 MG tablet [id number] No TAKE 0.5-1 TABLETS BY MOUTH AT BEDTIME AS NEEDED FOR SLEEP. [name], MD Taking Active   TRESIBA FLEXTOUCH 100 UNIT/ML SOPN FlexTouch Pen [id number] No 20 Units at bedtime. [provider] Taking Active            Med Note Darnelle Maffucci, Maralyn Sago Oct 26, 2020 11:27 AM) 6 units if glucose >200   triamcinolone (NASACORT) 55 MCG/ACT AERO nasal inhaler [id number] No Place 2 sprays into the nose daily. [provider] Taking Active   ursodiol (ACTIGALL) 500 MG tablet [id number] No Take 1,000 mg by mouth daily. [provider] Taking Active             Pertinent Labs:   Lab Results  Component Value Date   HGBA1C 8.0 (H) 02/15/2020   Lab Results  Component Value Date   CHOL 289 (H) 04/28/2020   HDL 54 04/28/2020   LDLCALC 200 (H) 04/28/2020   LDLDIRECT 153.0 06/19/2020   TRIG 177 (H) 04/28/2020   CHOLHDL 5.4 (H) 04/28/2020    Lab Results  Component Value Date   CREATININE 0.75 10/31/2020   BUN 15 10/31/2020   NA 134 (L) 10/31/2020   K 4.1 10/31/2020   CL 99 10/31/2020   CO2 28 10/31/2020    SDOH:  (Social Determinants of Health) assessments and interventions performed:  SDOH Interventions    Flowsheet Row Most Recent Value  SDOH Interventions   Financial Strain Interventions Other (Comment)  [manufacturer assistance]       CCM Care Plan  Review of patient past medical history, allergies, medications, health status, including review of consultants reports, laboratory and other test data, was performed as part of comprehensive evaluation and provision of chronic care management services.   Care Plan : Medication Management  Updates made by Darnelle Maffucci,  Arville Lime, RPH-CPP since 09/10/2021 12:00 AM     Problem: Hyperlipidemia, Diabetes, COPD      Long-Range Goal: Disease Progression Prevention   Start Date: 03/01/2021  Recent Progress: On track  Priority: High  Note:   Current Barriers:  Unable to independently afford treatment regimen Unable to achieve control of cholesterol   Pharmacist Clinical Goal(s):  Over the next 90 days, patient will verbalize ability to afford treatment regimen Over the next 90 days, patient will achieve control of cholesterol through collaboration with PharmD and provider.   Interventions: 1:1 collaboration with [name], MD regarding development and update of comprehensive plan of care as evidenced by provider attestation and co-signature Inter-disciplinary care team collaboration (see longitudinal plan of care) Comprehensive medication review performed; medication list updated in electronic medical record  Diabetes: Uncontrolled; current treatment: Tresiba 6 units daily QAM - though patient reports nausea that resolved with discontinuation; Novolog 2-12 units TID per sliding scale; established with [facility] Miachel Roux at [facility] Approved for patient assistance  through [employer] through 2023 Hx intolerance to: metformin (GI upset), Invokana (yeast infections), Jardiance (pancreatitis), Trulicity (abdominal pain), glipizide XR (hypoglycemia) Using Libre 14 day CGM.  Received call from Dr. Caryl Comes today. Discussed transition from [place] to different Basal insulin due to patient reported intolerance. Will pursue assistance for Basaglar through [place] cares and Novolog through [employer]  Hyperlipidemia and ASCVD risk reduction, aortic atherosclerosis: Uncontrolled; current treatment: none; Antiplatelet regimen: aspirin 81 mg daily Medications previously tried: rosuvastatin - liver enzyme elevations with therapy; Repatha (abdominal distention, headache, higher blood sugars, and more skin peeling on her face); ezetimibe - reports muscle aches Previously encouraged to continue to collaborate with cardiology.   Restrictive Lung Disease, possible ILD  Uncontrolled; current treatment: Breo 200/25 mcg daily, Albuterol HFA PRN, using 2-3 times weekly lately, triamcinolone nasal daily PRN.  1 exacerbation in the last 6 months Previously recommended to continue current regimen at this time  Anxiety, Insomnia: Suboptimally managed; current regimen: alprazolam 0.5 mg up to QID PRN anxiety, notes she is taking QID recently; reports she has not used trazodone recently due to lack of benefit Previously tried: escitalopram (noted rash) Trazodone - no benefit Previously recommended to continue current regimen at this time.   Neurologic Conditions: Moderately well controlled per patient report; follows w/ [facility] Cognitive impairment: reports recent initiation of donepezil 5 mg QPM. Some GI concerns, though could be concurrent GI disease Muscle spasms: ropinirole 0.25 mg daily PRN - though AVS notes that she could titrate to effect. Patient notes that PRN use is sufficient at this time Recommended to continue to monitor for dopaminergic effects such as  hypotension, stomach upset, dizziness, drowsiness.   Pain: Patient notes worsened control over the past few days, requiring more ibuprofen; current regimen: ibuprofen 400 mg PRN, often daily; tramadol 50 mg Q6H PRN, not needing every day. Stopped baclofen due to lack of benefit.   Previously recommended to continue current regimen at this time. Monitor renal function with regular use of NSAIDs vs discussion w/ GI regarding regular use of acetaminophen.   Gastrointestinal Concerns, Hepatic biliary cirrhosis: Current regimen: ursodiol 1000 mg daily; dicyclomine 10 mg TID PRN, famotidine 20 mg daily PRN; follows w/ Raylene Miyamoto at [facility] Previously recommended to current regimen at this time along with collaboration with GI.  Patient Goals/Self-Care Activities Over the next 90 days, patient will:  - take medications as prescribed check glucose three times daily using CGM, document, and provide at  future appointments collaborate with provider on medication access solutions      Plan: Telephone follow up appointment with care management team member scheduled for:  pending medication access needs  Catie Darnelle Maffucci, PharmD, [place], CPP Clinical Pharmacist [employer] at [facility] [phone]

## 2021-09-10 NOTE — Telephone Encounter (Signed)
Pt returning call. Pt requesting callback.  °

## 2021-09-12 ENCOUNTER — Telehealth: Payer: Self-pay | Admitting: Pharmacy Technician

## 2021-09-12 DIAGNOSIS — Z596 Low income: Secondary | ICD-10-CM

## 2021-09-12 NOTE — Progress Notes (Signed)
[facility])                                            [place] Team    09/12/2021  Kristina Mccoy 1955/10/11 [id number]                                      Medication Assistance Referral  Referral From: [facility] Embedded RPh Catie T.   Medication/Company: Nancee Liter / [employer] Patient application portion: Interoffice Mailed Provider application portion: Interoffice Mailed to Dr. Caryl Bis Provider address/fax verified via: Office website  Medication/Company: [name] / [employer] Patient application portion: Gaffer portion: [employer] to Dr. Caryl Bis Provider address/fax verified via: [employer]  Received both patient and provider portion(s) of patient assistance application(s) for [employer]. Faxed completed application and required documents into [employer].    Catlin Aycock P. Boubacar Lerette, [place]  [phone]

## 2021-09-13 ENCOUNTER — Encounter: Payer: Self-pay | Admitting: Family Medicine

## 2021-09-13 ENCOUNTER — Telehealth: Payer: Self-pay

## 2021-09-13 ENCOUNTER — Ambulatory Visit: Payer: PPO | Admitting: Physical Therapy

## 2021-09-13 NOTE — Telephone Encounter (Signed)
Good Morning Dr. Caryl Bis,   I hope you are well. I am experiencing sinus pain, some green discharge, chills, headaches.  Due to the frequency of sinusitis in the past, I believe that I have a sinus infection given my symptoms. This started Tuesday evening. I am not able to get any relief with Mucinex D and Nasacort. I appreciate your care. Thank you, Kristina Mccoy

## 2021-09-14 NOTE — Telephone Encounter (Signed)
I called and spoke with the patient and scheduled the patient to go to urgent care on [address] tomorrow to be checked.  Aryel Edelen,cma

## 2021-09-14 NOTE — Telephone Encounter (Signed)
Pt is calling back stating that she is having sinus pain, chills, headaches, chills, and pressure. Pt stating that she can barely touch her head. Pt requesting callback

## 2021-09-15 ENCOUNTER — Other Ambulatory Visit: Payer: Self-pay

## 2021-09-15 ENCOUNTER — Ambulatory Visit
Admission: RE | Admit: 2021-09-15 | Discharge: 2021-09-15 | Disposition: A | Discharge status: Home / Self Care | Payer: PPO | Source: Ambulatory Visit | Attending: Emergency Medicine | Admitting: Emergency Medicine

## 2021-09-15 VITALS — BP 158/86 | HR 84 | Temp 98.2°F | Resp 18

## 2021-09-15 DIAGNOSIS — R030 Elevated blood-pressure reading, without diagnosis of hypertension: Secondary | ICD-10-CM

## 2021-09-15 DIAGNOSIS — B349 Viral infection, unspecified: Secondary | ICD-10-CM | POA: Diagnosis not present

## 2021-09-15 LAB — POCT INFLUENZA A/B
Influenza A, POC: NEGATIVE
Influenza B, POC: NEGATIVE

## 2021-09-15 NOTE — ED Provider Notes (Signed)
[facility]    CSN: [id number] Arrival date & time: 09/15/21  1044      History   Chief Complaint Chief Complaint  Patient presents with   Cough   Chills   Nasal Congestion   Headache   Generalized Body Aches    HPI Kristina Mccoy is a 65 y.o. female.  Patient presents with 4 to 5-day history of congestion, sinus pressure, sinus pain, cough, fatigue, body aches, headache, chills.  She denies fever, rash, shortness of breath, vomiting, diarrhea, or other symptoms.  Treatment at home with Mucinex D.  Her medical history includes cardiomyopathy, chronic heart failure, Sjogren's syndrome, chronic pain, fibromyalgia, breast cancer, diabetes, chronic fatigue, cirrhosis, chronic sinusitis.  The history is provided by the patient and medical records.   Past Medical History:  Diagnosis Date   Abdominal pain, epigastric    Abnormal chest CT 09/08/2014   Allergic urticaria    Blood clotting disorder ([place])    Breast cancer ([place]) 5/14   left breast   Breast cancer ([place]) 02/2013   Dr Laurance Flatten ([facility])   Diabetes mellitus    Diverticulosis    Fibromyalgia    GERD (gastroesophageal reflux disease)    Heart murmur    Hyperlipidemia    Lymphedema of arm    right   Myalgia    Myositis    Neuromyositis    Otitis media, chronic    Pain syndrome, chronic    Peptic ulcer    Sicca syndrome (HCC)    Sjogren's disease ([place])    Sleep apnea    SOB (shortness of breath)    Systemic involvement of connective tissue [facility])     Patient Active Problem List   Diagnosis Date Noted   Head congestion 08/08/2021   Abnormal SPEP 05/01/2021   Reactive airway disease 01/29/2021   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   Elevated LFTs 08/04/2020   Chronic heart failure with preserved ejection fraction (HFpEF) ([place]) 06/15/2020   Elevated aldolase level 04/28/2020   Tick bite of back 02/14/2020   Non-ischemic cardiomyopathy ([place]) 01/13/2020   History of chest pain 10/20/2019   Former smoker  08/30/2019   Restrictive lung disease 08/30/2019   Foot pain 02/16/2019   Hair loss 08/31/2018   Bilateral leg cramps 05/27/2018   Memory difficulty 04/27/2018   Iliotibial band syndrome 04/27/2018   Allergic rhinitis 01/26/2018   RUQ pain 10/28/2017   Obesity (BMI 30.0-34.9) 10/28/2017   Sinusitis 07/25/2017   Pain in limb 06/03/2017   Chest pain 01/17/2017   Throat fullness 10/15/2016   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   Rash 08/08/2016   Lower abdominal pain 08/08/2016   Cephalalgia 01/26/2016   Dysuria 12/20/2015   Vitamin D deficiency 12/20/2015   Sjogren's syndrome ([place]) 12/20/2015   Chronic fatigue 12/04/2015   Insomnia 10/19/2015   Thrush 03/27/2015   Chronic bilateral low back pain without sciatica 11/07/2014   OSA on CPAP 10/19/2014   Anxiety and depression 08/16/2014   Allergic urticaria 06/02/2014   Dyspnea 05/04/2014   Vaginal yeast infection 07/06/2013   Type 2 diabetes mellitus ([place]) 07/06/2013   Malignant neoplasm of breast (female) ([place]) 02/28/2013   Blood clotting disorder ([place]) 12/01/2012   Palpitations 12/01/2012   Hyperlipidemia 12/01/2012   Chronic pain disorder 06/11/2012   Hearing loss 09/16/2011   Fibromyalgia 06/15/2011    Past Surgical History:  Procedure Laterality Date   BREAST SURGERY Bilateral    mastectomy   COLONOSCOPY WITH PROPOFOL N/A 04/22/2017  Procedure: COLONOSCOPY WITH PROPOFOL;  Surgeon: Lollie Sails, MD;  Location[facility] ENDOSCOPY;  Service: Endoscopy;  Laterality: N/A;   ESOPHAGOGASTRODUODENOSCOPY (EGD) WITH PROPOFOL N/A 04/22/2017   Procedure: ESOPHAGOGASTRODUODENOSCOPY (EGD) WITH PROPOFOL;  Surgeon: Lollie Sails, MD;  Location: [facility] ENDOSCOPY;  Service: Endoscopy;  Laterality: N/A;   MASTECTOMY Bilateral 03/23/13    OB History     Gravida  0   Para  0   Term  0   Preterm  0   AB  0   Living  0      SAB  0   IAB  0   Ectopic  0   Multiple  0   Live Births  0             Home Medications    Prior to Admission medications   Medication Sig Start Date End Date Taking? Authorizing Provider  albuterol (VENTOLIN HFA) 108 (90 Base) MCG/ACT inhaler Inhale 2 puffs into the lungs every 4 (four) hours as needed for wheezing or shortness of breath. 10/13/19   Sharion Balloon, NP  ALPRAZolam Duanne Moron) 0.5 MG tablet TAKE 1 TABLET (0.5 MG TOTAL) BY MOUTH 4 (FOUR) TIMES DAILY AS NEEDED FOR ANXIETY. 09/10/21   [name], MD  aspirin EC 81 MG tablet Take 1 tablet (81 mg total) by mouth daily. 12/23/19   End, Harrell Gave, MD  Azelastine HCl 137 MCG/SPRAY SOLN PLACE 2 SPRAYS INTO BOTH NOSTRILS 2 (TWO) TIMES DAILY. USE IN [facility] NOSTRIL AS DIRECTED 06/18/21   [name], MD  Biotin 10 MG TABS Take by mouth.    [provider]  BREO ELLIPTA 200-25 MCG/INH AEPB Inhale 1 puff into the lungs daily. 01/05/21   [provider]  Cholecalciferol (VITAMIN D3) 1000 units CAPS Take by mouth daily.     [provider]  clindamycin (CLEOCIN) 300 MG capsule Take 1 capsule (300 mg total) by mouth 3 (three) times daily. 03/21/21   [name], MD  clobetasol ointment (TEMOVATE) 0.05 % Apply topically. Vaginally    [provider]  clotrimazole (MYCELEX) 10 MG troche Take 10 mg by mouth 5 (five) times daily. 03/30/21   [provider]  Continuous Blood Gluc Sensor (FREESTYLE LIBRE 14 DAY SENSOR) MISC SMARTSIG:1 Each Topical Every 2 Weeks 02/05/20   [provider]  Cyanocobalamin 1000 MCG CAPS Take by mouth. Takes occassionally    [provider]  donepezil (ARICEPT) 5 MG tablet Take 5 mg by mouth at bedtime. 02/14/21   [provider]  Emollient (CERAVE) CREA Apply topically daily.     [provider]  famotidine (PEPCID) 20 MG tablet Take 1 tablet (20 mg total) by mouth at bedtime for 14 days. Patient taking differently: Take 20 mg by mouth daily. LRN 02/01/19 06/14/20  Jodelle Green, FNP  ibuprofen  (ADVIL) 200 MG tablet Take 200 mg by mouth every 6 (six) hours as needed.    [provider]  insulin aspart (NOVOLOG) 100 UNIT/ML FlexPen Inject 12 Units into the skin 3 (three) times daily with meals. 09/10/21   [name], MD  Insulin Glargine [facility]) 100 UNIT/ML Inject up to 20 units once daily as directed by endocrinology 09/10/21   [name], MD  ketoconazole (NIZORAL) 2 % cream Apply 1 application topically 2 (two) times daily as needed. 12/25/17   [provider]  ketoconazole (NIZORAL) 2 % shampoo Apply 1 application topically as needed. 12/25/17   [provider]  montelukast (SINGULAIR) 10 MG tablet TAKE 1 TABLET BY MOUTH EVERYDAY AT BEDTIME 12/04/20   [name], MD  ondansetron (ZOFRAN) 4 MG tablet TAKE 1 TABLET BY MOUTH EVERY 8 HOURS AS NEEDED FOR NAUSEA AND VOMITING 08/09/19   [name], MD  pilocarpine (SALAGEN) 5 MG tablet Take 5 mg by mouth 3 (three) times daily. 03/30/21   [provider]  Polyethyl Glycol-Propyl Glycol (SYSTANE OP) Apply to eye.    [provider]  pseudoephedrine-guaifenesin (MUCINEX D) 60-600 MG 12 hr tablet Take 1 tablet by mouth 2 (two) times daily as needed.     [provider]  rOPINIRole (REQUIP) 0.25 MG tablet Take 0.25 mg by mouth daily as needed. 02/13/21   [provider]  tamoxifen (NOLVADEX) 20 MG tablet Take 20 mg by mouth daily. 04/19/21   [provider]  traMADol (ULTRAM) 50 MG tablet TAKE 1 TABLET BY MOUTH EVERY 6 HOURS AS NEEDED FOR PAIN 02/14/21   [name], MD  traZODone (DESYREL) 50 MG tablet TAKE 0.5-1 TABLETS BY MOUTH AT BEDTIME AS NEEDED FOR SLEEP. 08/28/20   [name], MD  triamcinolone (NASACORT) 55 MCG/ACT AERO nasal inhaler Place 2 sprays into the nose daily.    [provider]  ursodiol (ACTIGALL) 500 MG tablet Take 1,000 mg by mouth daily. 11/13/20   [provider]    Family History Family  History  Problem Relation Age of Onset   Diabetes Mother    Heart disease Mother    Hyperlipidemia Mother    Heart failure Mother    Diabetes Other    Pancreatic cancer Father    Heart disease Brother        Aortic valve disease   Hyperlipidemia Brother    Colon polyps Brother    Stroke Maternal Grandmother    Breast cancer Maternal Aunt    Colon cancer Paternal Grandmother    Rheum arthritis Sister    Psoriasis Other        psoriatic arthritis    Heart disease Brother 26       Tachycardia   Heart disease Brother    Ovarian cancer Neg Hx     Social History Social History   Tobacco Use   Smoking status: Former    Packs/day: 1.00    Years: 30.00    Pack years: 30.00    Types: Cigarettes    Quit date: 02/28/2013    Years since quitting: 8.5   Smokeless tobacco: Never  Vaping Use   Vaping Use: Never used  Substance Use Topics   Alcohol use: No    Alcohol/week: 0.0 standard drinks   Drug use: No     Allergies   Gabapentin, Oxycodone, Repatha [evolocumab], Carbamazepine, Doxycycline, Dulaglutide, Ezetimibe, Insulin glargine, Levaquin [levofloxacin], Metformin, Methylprednisolone, Morphine, Pregabalin, Rosuvastatin, Venlafaxine, Buprenorphine, Canagliflozin, Escitalopram, and Hydroxychloroquine   Review of Systems Review of Systems  Constitutional:  Positive for chills and fatigue. Negative for fever.  HENT:  Positive for congestion, sinus pressure and sinus pain. Negative for ear pain and sore throat.   Respiratory:  Positive for cough. Negative for shortness of breath.   Cardiovascular:  Negative for chest pain and palpitations.  Gastrointestinal:  Negative for diarrhea and vomiting.  Skin:  Negative for color change and rash.  Neurological:  Positive for headaches.  All other systems reviewed and are negative.   Physical Exam Triage Vital Signs ED Triage Vitals  Enc Vitals Group  BP      Pulse      Resp      Temp      Temp src      SpO2      Weight       Height      Head Circumference      Peak Flow      Pain Score      Pain Loc      Pain Edu?      Excl. in [place]?    No data found.  Updated Vital Signs BP (!) 158/86 (BP Location: Left Arm)    Pulse 84    Temp 98.2 F (36.8 C) (Oral)    Resp 18    SpO2 99%   Visual Acuity Right Eye Distance:   Left Eye Distance:   Bilateral Distance:    Right Eye Near:   Left Eye Near:    Bilateral Near:     Physical Exam Vitals and nursing note reviewed.  Constitutional:      General: She is not in acute distress.    Appearance: She is well-developed.  HENT:     Right Ear: Tympanic membrane normal.     Left Ear: Tympanic membrane normal.     Nose: Congestion present.     Mouth/Throat:     Mouth: Mucous membranes are moist.     Pharynx: Oropharynx is clear.  Cardiovascular:     Rate and Rhythm: Normal rate and regular rhythm.     Heart sounds: Normal heart sounds.  Pulmonary:     Effort: Pulmonary effort is normal. No respiratory distress.     Breath sounds: Normal breath sounds.  Abdominal:     Palpations: Abdomen is soft.     Tenderness: There is no abdominal tenderness.  Musculoskeletal:     Cervical back: Neck supple.  Skin:    General: Skin is warm and dry.  Neurological:     Mental Status: She is alert.  Psychiatric:        Mood and Affect: Mood normal.        Behavior: Behavior normal.     UC Treatments / Results  Labs (all labs ordered are listed, but only abnormal results are displayed) Labs Reviewed  NOVEL CORONAVIRUS, NAA  POCT INFLUENZA A/B    EKG   Radiology No results found.  Procedures Procedures (including critical care time)  Medications Ordered in UC Medications - No data to display  Initial Impression / Assessment and Plan / UC Course  I have reviewed the triage vital signs and the nursing notes.  Pertinent labs & imaging results that were available during my care of the patient were reviewed by me and considered in my medical decision  making (see chart for details).   Viral illness, Elevated blood pressure reading.  Rapid flu negative.  COVID pending.  Instructed patient to self quarantine per CDC guidelines.  Discussed symptomatic treatment including Tylenol or ibuprofen, rest, hydration.  Instructed patient to follow up with PCP if symptoms are not improving.  Discussed with patient that her blood pressure is elevated today and needs to be rechecked by her PCP in 2 to 4 weeks.  Patient agrees to plan of care.    Final Clinical Impressions(s) / UC Diagnoses   Final diagnoses:  Viral illness  Elevated blood pressure reading     Discharge Instructions      Your flu test is negative and your COVID test is pending.  Take Tylenol  or ibuprofen as needed for fever or discomfort.  Rest and keep yourself hydrated.  Follow-up with your primary care provider if your symptoms are not improving.    Your blood pressure is elevated today at 158/86.  Please have this rechecked by your primary care provider in 2-4 weeks.          ED Prescriptions   None    PDMP not reviewed this encounter.   Sharion Balloon, NP 09/15/21 [phone]

## 2021-09-15 NOTE — Discharge Instructions (Addendum)
Your flu test is negative and your COVID test is pending.  Take Tylenol or ibuprofen as needed for fever or discomfort.  Rest and keep yourself hydrated.  Follow-up with your primary care provider if your symptoms are not improving.    Your blood pressure is elevated today at 158/86.  Please have this rechecked by your primary care provider in 2-4 weeks.

## 2021-09-15 NOTE — ED Triage Notes (Signed)
Pt here with sinus congestion, cough, body aches and headache x 4 days. Pt is certain it is sinusitis and requests abx.

## 2021-09-16 LAB — SARS-COV-2, NAA 2 DAY TAT

## 2021-09-16 LAB — NOVEL CORONAVIRUS, NAA: SARS-CoV-2, NAA: NOT DETECTED

## 2021-09-18 ENCOUNTER — Telehealth: Payer: Self-pay | Admitting: Family Medicine

## 2021-09-18 NOTE — Telephone Encounter (Signed)
Patient called in requesting to speak with Catie, Please call patient back [phone] concerning the new insulin medication

## 2021-09-18 NOTE — Telephone Encounter (Signed)
Called patient. Patient was following up on the status of patient assistance for [employer]. Advised that applications were submitted 12/19 per CPhT documentation and she would receive a letter when approved. Patient verbalized understanding

## 2021-09-20 ENCOUNTER — Encounter: Payer: Self-pay | Admitting: Physical Therapy

## 2021-09-20 ENCOUNTER — Ambulatory Visit: Payer: PPO | Admitting: Physical Therapy

## 2021-09-20 ENCOUNTER — Other Ambulatory Visit: Payer: Self-pay

## 2021-09-20 DIAGNOSIS — R293 Abnormal posture: Secondary | ICD-10-CM

## 2021-09-20 DIAGNOSIS — R278 Other lack of coordination: Secondary | ICD-10-CM

## 2021-09-20 DIAGNOSIS — R102 Pelvic and perineal pain: Secondary | ICD-10-CM

## 2021-09-20 NOTE — Therapy (Signed)
[facility] [address] [place], [place], [address] Phone: [phone]   Fax:  [phone]  Physical Therapy Treatment  Patient Details  Name: Kristina Mccoy MRN: [id number] Date of Birth: [birth year] Referring Provider (PT): Hassan Buckler   Encounter Date: 09/20/2021   PT End of Session - 09/20/21 1206     Visit Number 7    Number of Visits 12    Date for PT Re-Evaluation 10/25/21    Authorization Type IE 08/02/2021    PT Start Time 1200    PT Stop Time 1240    PT Time Calculation (min) 40 min    Activity Tolerance Patient tolerated treatment well    Behavior During Therapy [facility] for tasks assessed/performed             Past Medical History:  Diagnosis Date   Abdominal pain, epigastric    Abnormal chest CT 09/08/2014   Allergic urticaria    Blood clotting disorder ([place])    Breast cancer ([place]) 5/14   left breast   Breast cancer ([place]) 02/2013   Dr Laurance Flatten ([facility])   Diabetes mellitus    Diverticulosis    Fibromyalgia    GERD (gastroesophageal reflux disease)    Heart murmur    Hyperlipidemia    Lymphedema of arm    right   Myalgia    Myositis    Neuromyositis    Otitis media, chronic    Pain syndrome, chronic    Peptic ulcer    Sicca syndrome ([place])    Sjogren's disease (HCC)    Sleep apnea    SOB (shortness of breath)    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[place])     Past Surgical History:  Procedure Laterality Date   BREAST SURGERY Bilateral    mastectomy   COLONOSCOPY WITH PROPOFOL N/A 04/22/2017   Procedure: COLONOSCOPY WITH PROPOFOL;  Surgeon: Lollie Sails, MD;  Location: [facility] ENDOSCOPY;  Service: Endoscopy;  Laterality: N/A;   ESOPHAGOGASTRODUODENOSCOPY (EGD) WITH PROPOFOL N/A 04/22/2017   Procedure: ESOPHAGOGASTRODUODENOSCOPY (EGD) WITH PROPOFOL;  Surgeon: Lollie Sails, MD;  Location: [facility] ENDOSCOPY;  Service: Endoscopy;  Laterality: N/A;   MASTECTOMY Bilateral 03/23/13     There were no vitals filed for this visit.   Subjective Assessment - 09/20/21 1204     Subjective Patient reports that her pain has been generally increased with weather changes. Patient has been able to do some breathing and PFM strengthening while sick, but was not able to do much beyond that because of fatigue. Patient is feeling recovered from suspetced viral infection, but is still in considerable pain.    Pain Score 6     Pain Location Generalized              TREATMENT  Neuromuscular Re-education: Supine hooklying diaphragmatic breathing with VCs and TCs for downregulation of the nervous system and improved management of IAP Supine hooklying, PFM lengthening with inhalation. VCs and TCs to decrease compensatory patterns and encourage optimal relaxation of the PFM. Supine hooklying, PFM contractions with exhalation. VCs and TCs to decrease compensatory patterns and encourage activation of the PFM. Supine hooklying, TrA activation with exhalation  VCs and TCs to decrease compensatory patterns and minimize aggravation of the lumbar paraspinals. Reassessed goals; see below.   Patient educated throughout session on appropriate technique and form using multi-modal cueing, HEP, and activity modification. Patient articulated understanding and returned demonstration.  Patient Response to interventions: Denies any increased pain  ASSESSMENT  Patient presents to clinic with excellent motivation to participate in therapy. Patient demonstrates deficits in posture, PFM extensibility, PFM coordination, nervous system regulation, and pain. Patient indicating progress toward or achievement of goals during today's session and was able to coordinate a 3/5 PFM contraction with controlled relaxation as well as purposeful lengthening. Patient will benefit from continued skilled therapeutic intervention to address remaining deficits in posture, PFM extensibility, PFM coordination, nervous system  regulation, and pain in order to increase function and improve overall QOL.          PT Long Term Goals - 09/20/21 1207       PT LONG TERM GOAL #1   Title Patient will demonstrate independence with HEP in order to maximize therapeutic gains and improve carryover from physical therapy sessions to ADLs in the home and community.    Baseline IE: not initiated; 12/22: IND    Time 12    Period Weeks    Status Achieved    Target Date 10/25/21      PT LONG TERM GOAL #2   Title Patient will demonstrate coordinated lengthening and relaxation of PFM with diaphragmatic inhalation in order to decrease spasm and allow for unrestricted elimination of urine/feces for improved overall QOL.    Baseline IE: not demonstrated; 12/22: IND    Time 12    Period Weeks    Status Achieved    Target Date 10/25/21      PT LONG TERM GOAL #3   Title Patient will demonstrate understanding of basic self-management/down-regulation of the nervous system for persistent pain condition and stress as evidenced by diaphragmatic breathing without cueing, body scan/progressive relaxation meditation, and improved sleep hygiene in order to transition to independent management of patient's chief complaint: persistent pelvic pain.    Baseline IE: not demonstrated; 12/22: diaphragmatic breathing    Time 12    Period Weeks    Status On-going    Target Date 10/25/21      PT LONG TERM GOAL #4   Title Patient will demonstrate improved function as evidenced by a score of 65 on FOTO Urinary measure for full participation in activities at home and in the community.    Baseline IE: 22; 12/22: 62    Time 12    Period Weeks    Status On-going    Target Date 10/25/21      PT LONG TERM GOAL #5   Title Patient will report nocturia to be <2x/night for greater than 4 weeks in order to improve sleep quality and match age-predicted norms.    Baseline IE: 2-4x/night; 12/22: 2-3x/night    Time 12    Period Weeks    Status On-going     Target Date 10/25/21                   Plan - 09/20/21 1206     Clinical Impression Statement Patient presents to clinic with excellent motivation to participate in therapy. Patient demonstrates deficits in posture, PFM extensibility, PFM coordination, nervous system regulation, and pain. Patient indicating progress toward or achievement of goals during today's session and was able to coordinate a 3/5 PFM contraction with controlled relaxation as well as purposeful lengthening. Patient will benefit from continued skilled therapeutic intervention to address remaining deficits in posture, PFM extensibility, PFM coordination, nervous system regulation, and pain in order to increase function and improve overall QOL.    Personal Factors and Comorbidities Age;Behavior Pattern;Comorbidity 3+;Past/Current Experience;Time since onset of injury/illness/exacerbation    Comorbidities  Sjogrens, lichen sclerosus hx, DM, diverticulosis, fibromyalgia, GERD, hyperlipidemia, chronic fatigue, anxiety and depression, memory difficulty, insomnia    Stability/Clinical Decision Making Evolving/Moderate complexity    Rehab Potential Fair    PT Frequency 1x / week    PT Duration 12 weeks    PT Treatment/Interventions ADLs/Self Care Home Management;Cryotherapy;Electrical Stimulation;Moist Heat;Therapeutic activities;Therapeutic exercise;Neuromuscular re-education;Patient/family education;Orthotic Fit/Training;Manual techniques;Scar mobilization;Taping;Spinal Manipulations;Joint Manipulations    PT Next Visit Plan postural exercises, hip flexor stretches, PFM assessment    Consulted and Agree with Plan of Care Patient             Patient will benefit from skilled therapeutic intervention in order to improve the following deficits and impairments:  Abnormal gait, Pain, Postural dysfunction, Improper body mechanics, Decreased strength, Decreased coordination, Increased muscle spasms, Decreased activity  tolerance  Visit Diagnosis: Pelvic pain  Abnormal posture  Abnormal coordination     Problem List Patient Active Problem List   Diagnosis Date Noted   Head congestion 08/08/2021   Abnormal SPEP 05/01/2021   Reactive airway disease 01/29/2021   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   Elevated LFTs 08/04/2020   Chronic heart failure with preserved ejection fraction (HFpEF) ([place]) 06/15/2020   Elevated aldolase level 04/28/2020   Tick bite of back 02/14/2020   Non-ischemic cardiomyopathy ([place]) 01/13/2020   History of chest pain 10/20/2019   Former smoker 08/30/2019   Restrictive lung disease 08/30/2019   Foot pain 02/16/2019   Hair loss 08/31/2018   Bilateral leg cramps 05/27/2018   Memory difficulty 04/27/2018   Iliotibial band syndrome 04/27/2018   Allergic rhinitis 01/26/2018   RUQ pain 10/28/2017   Obesity (BMI 30.0-34.9) 10/28/2017   Sinusitis 07/25/2017   Pain in limb 06/03/2017   Chest pain 01/17/2017   Throat fullness 10/15/2016   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   Rash 08/08/2016   Lower abdominal pain 08/08/2016   Cephalalgia 01/26/2016   Dysuria 12/20/2015   Vitamin D deficiency 12/20/2015   Sjogren's syndrome ([place]) 12/20/2015   Chronic fatigue 12/04/2015   Insomnia 10/19/2015   Thrush 03/27/2015   Chronic bilateral low back pain without sciatica 11/07/2014   OSA on CPAP 10/19/2014   Anxiety and depression 08/16/2014   Allergic urticaria 06/02/2014   Dyspnea 05/04/2014   Vaginal yeast infection 07/06/2013   Type 2 diabetes mellitus ([place]) 07/06/2013   Malignant neoplasm of breast (female) ([place]) 02/28/2013   Blood clotting disorder ([place]) 12/01/2012   Palpitations 12/01/2012   Hyperlipidemia 12/01/2012   Chronic pain disorder 06/11/2012   Hearing loss 09/16/2011   Fibromyalgia 06/15/2011    Myles Gip PT, DPT [phone]  09/20/2021, 3:44 PM  [facility] [address] [place], [place],  [address] Phone: [phone]   Fax:  [phone]  Name: Kristina Mccoy MRN: [id number] Date of Birth: 13-Oct-1955

## 2021-09-21 ENCOUNTER — Encounter: Payer: Self-pay | Admitting: Family Medicine

## 2021-09-21 NOTE — Telephone Encounter (Signed)
BP noted. She needs to be scheduled for a follow-up with any available provider in our office for a BP recheck in 2-4 weeks. Thanks.

## 2021-09-26 ENCOUNTER — Encounter: Payer: Self-pay | Admitting: Family Medicine

## 2021-09-27 ENCOUNTER — Telehealth: Payer: Self-pay | Admitting: Pharmacist

## 2021-09-27 ENCOUNTER — Ambulatory Visit: Payer: PPO | Admitting: Physical Therapy

## 2021-09-27 NOTE — Telephone Encounter (Signed)
Received patient's supply of [employer], [employer] from [employer]. Received Basaglar U100 15 mL, noted as a 56 day supply.   Called patient to notify. Left voicemail. Will also send a MyChart message.

## 2021-09-28 ENCOUNTER — Encounter: Payer: Self-pay | Admitting: Family Medicine

## 2021-09-28 DIAGNOSIS — Z9013 Acquired absence of bilateral breasts and nipples: Secondary | ICD-10-CM

## 2021-10-03 DIAGNOSIS — Z794 Long term (current) use of insulin: Secondary | ICD-10-CM | POA: Diagnosis not present

## 2021-10-03 DIAGNOSIS — Z170 Estrogen receptor positive status [ER+]: Secondary | ICD-10-CM | POA: Diagnosis not present

## 2021-10-03 DIAGNOSIS — C50411 Malignant neoplasm of upper-outer quadrant of right female breast: Secondary | ICD-10-CM | POA: Diagnosis not present

## 2021-10-03 DIAGNOSIS — D472 Monoclonal gammopathy: Secondary | ICD-10-CM | POA: Diagnosis not present

## 2021-10-03 DIAGNOSIS — E1165 Type 2 diabetes mellitus with hyperglycemia: Secondary | ICD-10-CM | POA: Diagnosis not present

## 2021-10-05 ENCOUNTER — Telehealth: Payer: Self-pay | Admitting: Pharmacy Technician

## 2021-10-05 DIAGNOSIS — Z596 Low income: Secondary | ICD-10-CM

## 2021-10-05 NOTE — Progress Notes (Signed)
[facility])                                            [place] Team    10/05/2021  Kristina Mccoy 05/25/1956 [id number]  Care coordinations call placed to [place] in regard to Trulicity application and [place] in regard to Novolog application.  Spoke to [place] at [place] who informs patient was APPROVED 09/20/21-09/29/22. She informs the medication was shipped and delivered to the provider's office.  Spoke to [employer] at [employer] who informs patient was APPROVED 10/03/21-09/29/22. She informs the medication was sent to processing but due to shipping delays, the medication could take up to 30 business days to arrive at provider's office.  [name] P. [name], [place]  [phone]

## 2021-10-07 ENCOUNTER — Other Ambulatory Visit: Payer: Self-pay | Admitting: Family Medicine

## 2021-10-09 NOTE — Telephone Encounter (Signed)
Last OV 05/01/21 last fill 09/10/21 okay to fill Xanax?

## 2021-10-11 ENCOUNTER — Ambulatory Visit: Payer: PPO | Attending: Obstetrics and Gynecology | Admitting: Physical Therapy

## 2021-10-11 ENCOUNTER — Other Ambulatory Visit: Payer: Self-pay

## 2021-10-11 ENCOUNTER — Encounter: Payer: Self-pay | Admitting: Physical Therapy

## 2021-10-11 DIAGNOSIS — R293 Abnormal posture: Secondary | ICD-10-CM | POA: Insufficient documentation

## 2021-10-11 DIAGNOSIS — R278 Other lack of coordination: Secondary | ICD-10-CM | POA: Diagnosis not present

## 2021-10-11 DIAGNOSIS — R102 Pelvic and perineal pain: Secondary | ICD-10-CM | POA: Insufficient documentation

## 2021-10-11 NOTE — Therapy (Signed)
[facility] [address]. [place], [place], [address] Phone: [phone]   Fax:  [phone]  Physical Therapy Treatment  Patient Details  Name: Kristina Mccoy MRN: [id number] Date of Birth: May 24, 1956 Referring Provider (PT): Hassan Buckler   Encounter Date: 10/11/2021   PT End of Session - 10/11/21 1125     Visit Number 8    Number of Visits 12    Date for PT Re-Evaluation 10/25/21    Authorization Type IE 08/02/2021    PT Start Time 1120    PT Stop Time 1200    PT Time Calculation (min) 40 min    Activity Tolerance Patient tolerated treatment well    Behavior During Therapy [facility] for tasks assessed/performed             Past Medical History:  Diagnosis Date   Abdominal pain, epigastric    Abnormal chest CT 09/08/2014   Allergic urticaria    Blood clotting disorder ([place])    Breast cancer ([place]) 5/14   left breast   Breast cancer ([place]) 02/2013   Dr Laurance Flatten ([place])   Diabetes mellitus    Diverticulosis    Fibromyalgia    GERD (gastroesophageal reflux disease)    Heart murmur    Hyperlipidemia    Lymphedema of arm    right   Myalgia    Myositis    Neuromyositis    Otitis media, chronic    Pain syndrome, chronic    Peptic ulcer    Sicca syndrome ([place])    Sjogren's disease ([facility])    Sleep apnea    SOB (shortness of breath)    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[place])     Past Surgical History:  Procedure Laterality Date   BREAST SURGERY Bilateral    mastectomy   COLONOSCOPY WITH PROPOFOL N/A 04/22/2017   Procedure: COLONOSCOPY WITH PROPOFOL;  Surgeon: Lollie Sails, MD;  Location[facility] END[facility]COPY;  Service: Endoscopy;  Laterality: N/A;   ESOPHAGOGASTRODUODENOSCOPY (EGD) WITH PROPOFOL N/A 04/22/2017   Procedure: ESOPHAGOGASTRODUODENOSCOPY (EGD) WITH PROPOFOL;  Surgeon: Lollie Sails, MD;  Location: [facility] ENDOSCOPY;  Service: Endoscopy;  Laterality: N/A;   MASTECTOMY Bilateral 03/23/13     There were no vitals filed for this visit.   Subjective Assessment - 10/11/21 1123     Subjective Patient reports that she has had fluctuating pain since last visit. Patient notes today is a good day, 4-5/10 (generalized MSK). Patient states that with increased standing (~ 15 minutes) her back pain will get kicked up to a high level.    Currently in Pain? Yes    Pain Score 5     Pain Location Generalized            TREATMENT  Neuromuscular Re-education: Postural endurance series with coordinated breath and VCs and TCs throughout: Seated Transversus Abdominis Bracing - 10 reps Seated Pelvic Tilt - 10 reps Seated Pelvic Clock on Balance Disk - 10 reps Seated March - 10 reps Seated Marching with Opposite Shoulder Flexion - 10 reps Patient education on modifications to standing postures for improved body mechanics and pain modulation.   Patient educated throughout session on appropriate technique and form using multi-modal cueing, HEP, and activity modification. Patient articulated understanding and returned demonstration.  Patient Response to interventions: Notes she can feel her abdominal muscles  ASSESSMENT Patient presents to clinic with excellent motivation to participate in therapy. Patient demonstrates deficits in posture, PFM extensibility, PFM coordination, nervous system regulation, and  pain. Patient able to perform seated postural exercises with adequate form in the presence of consistent multimodal cueing during today's session and was able to coordinate a 3/5 PFM contraction with controlled relaxation as well as purposeful lengthening. Patient will benefit from continued skilled therapeutic intervention to address remaining deficits in posture, PFM extensibility, PFM coordination, nervous system regulation, and pain in order to increase function and improve overall QOL.   PT Long Term Goals - 09/20/21 1207       PT LONG TERM GOAL #1   Title Patient will  demonstrate independence with HEP in order to maximize therapeutic gains and improve carryover from physical therapy sessions to ADLs in the home and community.    Baseline IE: not initiated; 12/22: IND    Time 12    Period Weeks    Status Achieved    Target Date 10/25/21      PT LONG TERM GOAL #2   Title Patient will demonstrate coordinated lengthening and relaxation of PFM with diaphragmatic inhalation in order to decrease spasm and allow for unrestricted elimination of urine/feces for improved overall QOL.    Baseline IE: not demonstrated; 12/22: IND    Time 12    Period Weeks    Status Achieved    Target Date 10/25/21      PT LONG TERM GOAL #3   Title Patient will demonstrate understanding of basic self-management/down-regulation of the nervous system for persistent pain condition and stress as evidenced by diaphragmatic breathing without cueing, body scan/progressive relaxation meditation, and improved sleep hygiene in order to transition to independent management of patient's chief complaint: persistent pelvic pain.    Baseline IE: not demonstrated; 12/22: diaphragmatic breathing    Time 12    Period Weeks    Status On-going    Target Date 10/25/21      PT LONG TERM GOAL #4   Title Patient will demonstrate improved function as evidenced by a score of 65 on FOTO Urinary measure for full participation in activities at home and in the community.    Baseline IE: 1; 12/22: 62    Time 12    Period Weeks    Status On-going    Target Date 10/25/21      PT LONG TERM GOAL #5   Title Patient will report nocturia to be <2x/night for greater than 4 weeks in order to improve sleep quality and match age-predicted norms.    Baseline IE: 2-4x/night; 12/22: 2-3x/night    Time 12    Period Weeks    Status On-going    Target Date 10/25/21                   Plan - 10/11/21 1353     Clinical Impression Statement Patient presents to clinic with excellent motivation to participate  in therapy. Patient demonstrates deficits in posture, PFM extensibility, PFM coordination, nervous system regulation, and pain. Patient able to perform seated postural exercises with adequate form in the presence of consistent multimodal cueing during today's session and was able to coordinate a 3/5 PFM contraction with controlled relaxation as well as purposeful lengthening. Patient will benefit from continued skilled therapeutic intervention to address remaining deficits in posture, PFM extensibility, PFM coordination, nervous system regulation, and pain in order to increase function and improve overall QOL.    Personal Factors and Comorbidities Age;Behavior Pattern;Comorbidity 3+;Past/Current Experience;Time since onset of injury/illness/exacerbation    Comorbidities Sjogrens, lichen sclerosus hx, DM, diverticulosis, fibromyalgia, GERD, hyperlipidemia, chronic fatigue, anxiety and  depression, memory difficulty, insomnia    Stability/Clinical Decision Making Evolving/Moderate complexity    Rehab Potential Fair    PT Frequency 1x / week    PT Duration 12 weeks    PT Treatment/Interventions ADLs/Self Care Home Management;Cryotherapy;Electrical Stimulation;Moist Heat;Therapeutic activities;Therapeutic exercise;Neuromuscular re-education;Patient/family education;Orthotic Fit/Training;Manual techniques;Scar mobilization;Taping;Spinal Manipulations;Joint Manipulations    PT Next Visit Plan postural exercises, hip flexor stretches, PFM assessment    Consulted and Agree with Plan of Care Patient             Patient will benefit from skilled therapeutic intervention in order to improve the following deficits and impairments:  Abnormal gait, Pain, Postural dysfunction, Improper body mechanics, Decreased strength, Decreased coordination, Increased muscle spasms, Decreased activity tolerance  Visit Diagnosis: Pelvic pain  Abnormal posture  Abnormal coordination     Problem List Patient Active  Problem List   Diagnosis Date Noted   Head congestion 08/08/2021   Abnormal SPEP 05/01/2021   Reactive airway disease 01/29/2021   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   Elevated LFTs 08/04/2020   Chronic heart failure with preserved ejection fraction (HFpEF) ([place]) 06/15/2020   Elevated aldolase level 04/28/2020   Tick bite of back 02/14/2020   Non-ischemic cardiomyopathy ([place]) 01/13/2020   History of chest pain 10/20/2019   Former smoker 08/30/2019   Restrictive lung disease 08/30/2019   Foot pain 02/16/2019   Hair loss 08/31/2018   Bilateral leg cramps 05/27/2018   Memory difficulty 04/27/2018   Iliotibial band syndrome 04/27/2018   Allergic rhinitis 01/26/2018   RUQ pain 10/28/2017   Obesity (BMI 30.0-34.9) 10/28/2017   Sinusitis 07/25/2017   Pain in limb 06/03/2017   Chest pain 01/17/2017   Throat fullness 10/15/2016   Primary biliary cirrhosis ([place]) 10/15/2016   Rash 08/08/2016   Lower abdominal pain 08/08/2016   Cephalalgia 01/26/2016   Dysuria 12/20/2015   Vitamin D deficiency 12/20/2015   Sjogren's syndrome ([place]) 12/20/2015   Chronic fatigue 12/04/2015   Insomnia 10/19/2015   Thrush 03/27/2015   Chronic bilateral low back pain without sciatica 11/07/2014   OSA on CPAP 10/19/2014   Anxiety and depression 08/16/2014   Allergic urticaria 06/02/2014   Dyspnea 05/04/2014   Vaginal yeast infection 07/06/2013   Type 2 diabetes mellitus ([place]) 07/06/2013   Malignant neoplasm of breast (female) ([place]) 02/28/2013   Blood clotting disorder ([place]) 12/01/2012   Palpitations 12/01/2012   Hyperlipidemia 12/01/2012   Chronic pain disorder 06/11/2012   Hearing loss 09/16/2011   Fibromyalgia 06/15/2011    Myles Gip PT, DPT [phone]  10/11/2021, 1:53 PM  [facility] [address] [place], [place], [address] Phone: [phone]   Fax:  [phone]  Name: Shamiah Kahler MRN: [id number] Date of Birth: 1956-05-23

## 2021-10-18 DIAGNOSIS — C50112 Malignant neoplasm of central portion of left female breast: Secondary | ICD-10-CM | POA: Diagnosis not present

## 2021-10-18 DIAGNOSIS — C50111 Malignant neoplasm of central portion of right female breast: Secondary | ICD-10-CM | POA: Diagnosis not present

## 2021-10-18 DIAGNOSIS — Z9013 Acquired absence of bilateral breasts and nipples: Secondary | ICD-10-CM | POA: Diagnosis not present

## 2021-10-18 NOTE — Telephone Encounter (Signed)
Pt called in stating [place] has faxed [place] a request for two additional bras for pt that is covered by her insurance. The fax was sent on 1/9 and 1/16.

## 2021-10-22 ENCOUNTER — Ambulatory Visit: Payer: PPO | Admitting: Physical Therapy

## 2021-10-26 ENCOUNTER — Encounter: Payer: Self-pay | Admitting: Internal Medicine

## 2021-10-26 ENCOUNTER — Other Ambulatory Visit: Payer: Self-pay

## 2021-10-26 ENCOUNTER — Ambulatory Visit (INDEPENDENT_AMBULATORY_CARE_PROVIDER_SITE_OTHER): Payer: PPO | Admitting: Internal Medicine

## 2021-10-26 VITALS — BP 126/78 | HR 83 | Temp 98.1°F | Ht 64.0 in | Wt 158.0 lb

## 2021-10-26 DIAGNOSIS — E785 Hyperlipidemia, unspecified: Secondary | ICD-10-CM

## 2021-10-26 DIAGNOSIS — R200 Anesthesia of skin: Secondary | ICD-10-CM

## 2021-10-26 DIAGNOSIS — Z853 Personal history of malignant neoplasm of breast: Secondary | ICD-10-CM | POA: Diagnosis not present

## 2021-10-26 DIAGNOSIS — E119 Type 2 diabetes mellitus without complications: Secondary | ICD-10-CM | POA: Diagnosis not present

## 2021-10-26 DIAGNOSIS — R202 Paresthesia of skin: Secondary | ICD-10-CM

## 2021-10-26 DIAGNOSIS — M898X1 Other specified disorders of bone, shoulder: Secondary | ICD-10-CM | POA: Diagnosis not present

## 2021-10-26 DIAGNOSIS — R748 Abnormal levels of other serum enzymes: Secondary | ICD-10-CM

## 2021-10-26 DIAGNOSIS — E559 Vitamin D deficiency, unspecified: Secondary | ICD-10-CM | POA: Diagnosis not present

## 2021-10-26 DIAGNOSIS — M899 Disorder of bone, unspecified: Secondary | ICD-10-CM | POA: Diagnosis not present

## 2021-10-26 DIAGNOSIS — I491 Atrial premature depolarization: Secondary | ICD-10-CM | POA: Diagnosis not present

## 2021-10-26 DIAGNOSIS — I10 Essential (primary) hypertension: Secondary | ICD-10-CM

## 2021-10-26 DIAGNOSIS — I493 Ventricular premature depolarization: Secondary | ICD-10-CM

## 2021-10-26 MED ORDER — METOPROLOL SUCCINATE ER 25 MG PO TB24: 12.5000 mg | ORAL_TABLET | Freq: Every day | ORAL | 3 refills | Status: DC

## 2021-10-26 NOTE — Patient Instructions (Addendum)
[facility]   [place], [place] [address]   [phone]   Neurology   You may need nerve conduction study/EMG  Start toprol xl 1/2 tablet  Paresthesia Paresthesia is an abnormal burning or prickling sensation. It is usually felt in the hands, arms, legs, or feet. However, it may occur in any part of the body. Usually, paresthesia is not painful. It may feel like: Tingling or numbness. Buzzing. Itching. Paresthesia may occur without any clear cause, or it may be caused by: Breathing too quickly (hyperventilation). Pressure on a nerve. An underlying medical condition. Side effects of a medication. Nutritional deficiencies. Exposure to toxic chemicals. Most people experience temporary (transient) paresthesia at some time in their lives. For some people, it may be long-lasting (chronic) because of an underlying medical condition. If you have paresthesia that lasts a long time, you need to be evaluated by your health care provider. Follow these instructions at home: Alcohol use  Do not drink alcohol if: Your health care provider tells you not to drink. You are pregnant, may be pregnant, or are planning to become pregnant. If you drink alcohol: Limit how much you use to: 0-1 drink a day for women. 0-2 drinks a day for men. Be aware of how much alcohol is in your drink. In the [place] one drink equals one 12 oz bottle of beer (355 mL), one 5 oz glass of wine (148 mL), or one 1 oz glass of hard liquor (44 mL). Nutrition  Eat a healthy diet. This includes: Eating foods that are high in fiber, such as fresh fruits and vegetables, whole grains, and beans. Limiting foods that are high in fat and processed sugars, such as fried or sweet foods. General instructions Take over-the-counter and prescription medicines only as told by your health care provider. Do not use any products that contain nicotine or tobacco, such as cigarettes and e-cigarettes. These can keep  blood from reaching damaged nerves. If you need help quitting, ask your health care provider. If you have diabetes, work closely with your health care provider to keep your blood sugar under control. If you have numbness in your feet: Check every day for signs of injury or infection. Watch for redness, warmth, and swelling. Wear padded socks and comfortable shoes. These help protect your feet. Keep all follow-up visits as told by your health care provider. This is important. Contact a health care provider if you: Have paresthesia that gets worse or does not go away. Have numbness after an injury. Have a burning or prickling feeling that gets worse when you walk. Have pain, cramps, or dizziness, or you faint. Develop a rash. Get help right away if you: Feel muscle weakness. Develop new weakness in an arm or leg. Have trouble walking or moving. Have problems with speech, understanding, or vision. Feel confused. Cannot control your bladder or bowel movements. Summary Paresthesia is an abnormal burning or prickling sensation that is usually felt in the hands, arms, legs, or feet. It may also occur in other parts of the body. Paresthesia may occur without any clear cause, or it may be caused by breathing too quickly (hyperventilation), pressure on a nerve, an underlying medical condition, side effects of a medication, nutritional deficiencies, or exposure to toxic chemicals. If you have paresthesia that lasts a long time, you need to be evaluated by your health care provider. This information is not intended to replace advice given to you by your health care provider. Make sure you  discuss any questions you have with your health care provider. Document Revised: 06/27/2020 Document Reviewed: 06/27/2020 Elsevier Patient Education  2022 [employer].

## 2021-10-26 NOTE — Progress Notes (Signed)
Chief Complaint  Patient presents with   Blood Pressure Check   F/u  1. BP elevated UC 08/2021 sbp 150s h/o palpitations, PACs and PVCs was on toprol xl 12.5 mg qd in the past  H/o MI noted cardiac mri 11/2020 [facility] cardiology agreeable to restart  Hld cant tolerate statins, repatha, zetia   2. H/o breast cancer s/p masectomy with bone pain back h/o wanted bone scan and pt wants to do locally will order  3. Dm 2 f/u pcp will sch fastin glabs   Review of Systems  Constitutional:  Negative for weight loss.  HENT:  Negative for hearing loss.   Eyes:  Negative for blurred vision.  Respiratory:  Negative for shortness of breath.   Cardiovascular:  Positive for palpitations. Negative for chest pain.  Gastrointestinal:  Negative for abdominal pain and blood in stool.  Genitourinary:  Negative for dysuria.  Musculoskeletal:  Negative for falls and joint pain.  Skin:  Negative for rash.  Neurological:  Negative for headaches.  Psychiatric/Behavioral:  Negative for depression.   Past Medical History:  Diagnosis Date   Abdominal pain, epigastric    Abnormal chest CT 09/08/2014   Allergic urticaria    Blood clotting disorder (HCC)    Breast cancer ([place]) 01/2013   left breast   Breast cancer ([place]) 02/2013   Dr Laurance Flatten ([facility])   Diabetes mellitus    Diverticulosis    Fibromyalgia    GERD (gastroesophageal reflux disease)    Heart murmur    Hyperlipidemia    Lymphedema of arm    right   MI (myocardial infarction) ([place])    cardiac MRI 3/2022There is a small subendocardial infarction (< 25% transmural) involving the basal-to-mid inferolateral wall, which is consistent with the distribution of a posterolateral branch of the RCA or LCx (depending on the pattern of coronary dominance).   Myalgia    Myositis    Neuromyositis    Otitis media, chronic    Pain syndrome, chronic    Peptic ulcer    Sicca syndrome (HCC)    Sjogren's disease (HCC)    Sleep apnea    SOB (shortness of  breath)    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[place])    Past Surgical History:  Procedure Laterality Date   BREAST SURGERY Bilateral    mastectomy   COLONOSCOPY WITH PROPOFOL N/A 04/22/2017   Procedure: COLONOSCOPY WITH PROPOFOL;  Surgeon: Lollie Sails, MD;  Location: [facility] ENDOSCOPY;  Service: Endoscopy;  Laterality: N/A;   ESOPHAGOGASTRODUODENOSCOPY (EGD) WITH PROPOFOL N/A 04/22/2017   Procedure: ESOPHAGOGASTRODUODENOSCOPY (EGD) WITH PROPOFOL;  Surgeon: Lollie Sails, MD;  Location: [facility] ENDOSCOPY;  Service: Endoscopy;  Laterality: N/A;   MASTECTOMY Bilateral 03/23/13   Family History  Problem Relation Age of Onset   Diabetes Mother    Heart disease Mother    Hyperlipidemia Mother    Heart failure Mother    Diabetes Other    Pancreatic cancer Father    Heart disease Brother        Aortic valve disease   Hyperlipidemia Brother    Colon polyps Brother    Stroke Maternal Grandmother    Breast cancer Maternal Aunt    Colon cancer Paternal Grandmother    Rheum arthritis Sister    Psoriasis Other        psoriatic arthritis    Heart disease Brother 46       Tachycardia   Heart disease Brother    Ovarian cancer Neg Hx  Social History   Socioeconomic History   Marital status: Single    Spouse name: Not on file   Number of children: Not on file   Years of education: Not on file   Highest education level: Not on file  Occupational History   Occupation: disabled  Tobacco Use   Smoking status: Former    Packs/day: 1.00    Years: 30.00    Pack years: 30.00    Types: Cigarettes    Quit date: 02/28/2013    Years since quitting: 8.6   Smokeless tobacco: Never  Vaping Use   Vaping Use: Never used  Substance and Sexual Activity   Alcohol use: No    Alcohol/week: 0.0 standard drinks   Drug use: No   Sexual activity: Never    Birth control/protection: Post-menopausal  Other Topics Concern   Not on file  Social History Narrative   Not on file   Social  Determinants of Health   Financial Resource Strain: Medium Risk   Difficulty of Paying Living Expenses: Somewhat hard  Food Insecurity: Not on file  Transportation Needs: Not on file  Physical Activity: Not on file  Stress: Not on file  Social Connections: Not on file  Intimate Partner Violence: Not on file   Current Meds  Medication Sig   albuterol (VENTOLIN HFA) 108 (90 Base) MCG/ACT inhaler Inhale 2 puffs into the lungs every 4 (four) hours as needed for wheezing or shortness of breath.   ALPRAZolam (XANAX) 0.5 MG tablet TAKE 1 TABLET (0.5 MG TOTAL) BY MOUTH 4 (FOUR) TIMES DAILY AS NEEDED FOR ANXIETY.   aspirin EC 81 MG tablet Take 1 tablet (81 mg total) by mouth daily.   Azelastine HCl 137 MCG/SPRAY SOLN PLACE 2 SPRAYS INTO BOTH NOSTRILS 2 (TWO) TIMES DAILY. USE IN EACH NOSTRIL AS DIRECTED   Biotin 10 MG TABS Take by mouth.   BREO ELLIPTA 200-25 MCG/INH AEPB Inhale 1 puff into the lungs daily.   Cholecalciferol (VITAMIN D3) 1000 units CAPS Take by mouth daily.    clobetasol ointment (TEMOVATE) 0.05 % Apply topically. Vaginally   Continuous Blood Gluc Sensor (FREESTYLE LIBRE 14 DAY SENSOR) MISC SMARTSIG:1 Each Topical Every 2 Weeks   Cyanocobalamin 1000 MCG CAPS Take by mouth. Takes occassionally   Emollient (CERAVE) CREA Apply topically daily.    ibuprofen (ADVIL) 200 MG tablet Take 200 mg by mouth every 6 (six) hours as needed.   insulin aspart (NOVOLOG) 100 UNIT/ML FlexPen Inject 12 Units into the skin 3 (three) times daily with meals.   Insulin Glargine (BASAGLAR KWIKPEN) 100 UNIT/ML Inject up to 20 units once daily as directed by endocrinology   ketoconazole (NIZORAL) 2 % cream Apply 1 application topically 2 (two) times daily as needed.   ketoconazole (NIZORAL) 2 % shampoo Apply 1 application topically as needed.   metoprolol succinate (TOPROL-XL) 25 MG 24 hr tablet Take 0.5 tablets (12.5 mg total) by mouth daily.   montelukast (SINGULAIR) 10 MG tablet TAKE 1 TABLET BY MOUTH  EVERYDAY AT BEDTIME   ondansetron (ZOFRAN) 4 MG tablet TAKE 1 TABLET BY MOUTH EVERY 8 HOURS AS NEEDED FOR NAUSEA AND VOMITING   Polyethyl Glycol-Propyl Glycol (SYSTANE OP) Apply to eye.   pseudoephedrine-guaifenesin (MUCINEX D) 60-600 MG 12 hr tablet Take 1 tablet by mouth 2 (two) times daily as needed.    traMADol (ULTRAM) 50 MG tablet TAKE 1 TABLET BY MOUTH EVERY 6 HOURS AS NEEDED FOR PAIN   traZODone (DESYREL) 50 MG tablet TAKE 0.5-1  TABLETS BY MOUTH AT BEDTIME AS NEEDED FOR SLEEP.   triamcinolone (NASACORT) 55 MCG/ACT AERO nasal inhaler Place 2 sprays into the nose daily.   ursodiol (ACTIGALL) 500 MG tablet Take 1,000 mg by mouth daily.   Allergies  Allergen Reactions   Gabapentin Itching and Swelling   Oxycodone Itching and Rash   Repatha [Evolocumab] Other (See Comments)    abdominal distention, headache, higher blood sugars, facial skin peeling   Carbamazepine Nausea And Vomiting and Other (See Comments)    Abdominal pain    Doxycycline Other (See Comments)   Dulaglutide     Other reaction(s): Other (See Comments) Severe stomach pain   Ezetimibe Other (See Comments)    myalgias   Insulin Glargine Other (See Comments)    BLOATING, MUSCLE/JOINT PAIN   Levaquin [Levofloxacin]     Diarrhea and pain in calf   Metformin     Other reaction(s): Other (See Comments) Cramping- abdominal pain   Methylprednisolone     Reddened face, puffy face    Morphine Other (See Comments)   Pregabalin Swelling    Swelling in arms and legs   Rosuvastatin Other (See Comments)    LFTs increased    Venlafaxine Other (See Comments)   Buprenorphine Rash and Swelling   Canagliflozin Other (See Comments) and Rash    YEAST INFECTIONS   Escitalopram Rash   Hydroxychloroquine Rash   Recent Results (from the past 2160 hour(s))  COVID-19, Flu A+B and RSV     Status: None   Collection Time: 08/08/21 11:41 AM   Specimen: Nasal Swab   Nasal Swab  Previously  Result Value Ref Range   SARS-CoV-2, NAA  Not Detected Not Detected   Influenza A, NAA Not Detected Not Detected   Influenza B, NAA Not Detected Not Detected   RSV, NAA Not Detected Not Detected   Test Information: Comment     Comment: This nucleic acid amplification test was developed and its performance characteristics determined by [employer]. Nucleic acid amplification tests include RT-PCR and TMA. This test has not been FDA cleared or approved. This test has been authorized by FDA under an Emergency Use Authorization (EUA). This test is only authorized for the duration of time the declaration that circumstances exist justifying the authorization of the emergency use of in vitro diagnostic tests for detection of SARS-CoV-2 virus and/or diagnosis of COVID-19 infection under section 564(b)(1) of the Act, 21 U.S.C. 709GGE-3(M) (1), unless the authorization is terminated or revoked sooner. When diagnostic testing is negative, the possibility of a false negative result should be considered in the context of a patient's recent exposures and the presence of clinical signs and symptoms consistent with COVID-19. An individual without symptoms of COVID-19 and who is not shedding SARS-CoV-2 virus wo uld expect to have a negative (not detected) result in this assay.   HM DIABETES EYE EXAM     Status: None   Collection Time: 08/14/21 12:00 AM  Result Value Ref Range   HM Diabetic Eye Exam No Retinopathy No Retinopathy  POCT Influenza A/B     Status: None   Collection Time: 09/15/21 11:53 AM  Result Value Ref Range   Influenza A, POC Negative Negative   Influenza B, POC Negative Negative  Novel Coronavirus, NAA ([facility])     Status: None   Collection Time: 09/15/21 12:04 PM   Specimen: Nasopharyngeal(NP) swabs in vial transport medium   Nasopharynge  Result Value Ref Range   SARS-CoV-2, NAA Not Detected Not Detected  Comment: This nucleic acid amplification test was developed and its performance characteristics  determined by [employer]. Nucleic acid amplification tests include RT-PCR and TMA. This test has not been FDA cleared or approved. This test has been authorized by FDA under an Emergency Use Authorization (EUA). This test is only authorized for the duration of time the declaration that circumstances exist justifying the authorization of the emergency use of in vitro diagnostic tests for detection of SARS-CoV-2 virus and/or diagnosis of COVID-19 infection under section 564(b)(1) of the Act, 21 U.S.C. 433IRJ-1(O) (1), unless the authorization is terminated or revoked sooner. When diagnostic testing is negative, the possibility of a false negative result should be considered in the context of a patient's recent exposures and the presence of clinical signs and symptoms consistent with COVID-19. An individual without symptoms of COVID-19 and who is not shedding SARS-CoV-2 virus wo uld expect to have a negative (not detected) result in this assay.   SARS-COV-2, NAA 2 DAY TAT     Status: None   Collection Time: 09/15/21 12:04 PM   Nasopharynge  Result Value Ref Range   SARS-CoV-2, NAA 2 DAY TAT Performed    Objective  Body mass index is 27.12 kg/m. Wt Readings from Last 3 Encounters:  10/26/21 158 lb (71.7 kg)  08/08/21 158 lb 3.2 oz (71.8 kg)  05/01/21 156 lb 12.8 oz (71.1 kg)   Temp Readings from Last 3 Encounters:  10/26/21 98.1 F (36.7 C) (Oral)  09/15/21 98.2 F (36.8 C) (Oral)  08/08/21 97.7 F (36.5 C) (Oral)   BP Readings from Last 3 Encounters:  10/26/21 126/78  09/15/21 (!) 158/86  08/08/21 (!) 150/68   Pulse Readings from Last 3 Encounters:  10/26/21 83  09/15/21 84  08/08/21 98    Physical Exam Vitals and nursing note reviewed.  Constitutional:      Appearance: Normal appearance. She is well-developed and well-groomed.  HENT:     Head: Normocephalic and atraumatic.  Eyes:     Conjunctiva/sclera: Conjunctivae normal.     Pupils: Pupils are  equal, round, and reactive to light.  Cardiovascular:     Rate and Rhythm: Normal rate and regular rhythm.     Heart sounds: Normal heart sounds. No murmur heard. Pulmonary:     Effort: Pulmonary effort is normal.     Breath sounds: Normal breath sounds.  Abdominal:     General: Abdomen is flat. Bowel sounds are normal.     Tenderness: There is no abdominal tenderness.  Musculoskeletal:        General: No tenderness.  Skin:    General: Skin is warm and dry.  Neurological:     General: No focal deficit present.     Mental Status: She is alert and oriented to person, place, and time. Mental status is at baseline.     Cranial Nerves: Cranial nerves 2-12 are intact.     Gait: Gait is intact.  Psychiatric:        Attention and Perception: Attention and perception normal.        Mood and Affect: Mood and affect normal.        Speech: Speech normal.        Behavior: Behavior normal. Behavior is cooperative.        Thought Content: Thought content normal.        Cognition and Memory: Cognition and memory normal.        Judgment: Judgment normal.    Assessment  Plan  Paresthesia  Numbness and tingling Call f/u [facility] neurology may need emg/ncs given # establish   PVC (premature ventricular contraction) Premature atrial beats H/o mI and elevated BP/htn Restart toprol xl 12.5 mg qd   Type 2 diabetes mellitus without complication, without long-term current use of insulin (HCC) - Plan: Comprehensive metabolic panel, Lipid panel, Hemoglobin A1c, Urinalysis, Routine w reflex microscopic, Microalbumin / creatinine urine ratio F/u PCP   Hyperlipidemia, unspecified hyperlipidemia type - Plan: Comprehensive metabolic panel, Lipid panel See hpi   Vitamin D deficiency - Plan: Vitamin D (25 hydroxy)  Bone pain of shoulder - Plan: NM Bone Scan Whole Body Elevated alkaline phosphatase level - Plan: NM Bone Scan Whole Body, Alkaline phosphatase, isoenzymes, Gamma GT History of breast cancer -  Plan: NM Bone Scan Whole Body  S/p mastectomy  [facility] onc[facility]   Ob/gyn care Dr. Leafy Ro   Provider: Dr. Olivia Mackie McLean-[facility]

## 2021-10-31 ENCOUNTER — Encounter
Admission: RE | Admit: 2021-10-31 | Discharge: 2021-10-31 | Disposition: A | Discharge status: Home / Self Care | Payer: PPO | Source: Ambulatory Visit | Attending: Internal Medicine | Admitting: Internal Medicine

## 2021-10-31 ENCOUNTER — Other Ambulatory Visit: Payer: Self-pay

## 2021-10-31 DIAGNOSIS — Z853 Personal history of malignant neoplasm of breast: Secondary | ICD-10-CM | POA: Diagnosis not present

## 2021-10-31 DIAGNOSIS — M898X1 Other specified disorders of bone, shoulder: Secondary | ICD-10-CM | POA: Diagnosis not present

## 2021-10-31 DIAGNOSIS — R748 Abnormal levels of other serum enzymes: Secondary | ICD-10-CM | POA: Insufficient documentation

## 2021-10-31 DIAGNOSIS — C50919 Malignant neoplasm of unspecified site of unspecified female breast: Secondary | ICD-10-CM | POA: Diagnosis not present

## 2021-10-31 MED ORDER — TECHNETIUM TC 99M MEDRONATE IV KIT
19.9200 | PACK | Freq: Once | INTRAVENOUS | Status: AC | PRN
  Administered 2021-10-31: 19.92 via INTRAVENOUS

## 2021-11-05 ENCOUNTER — Telehealth: Payer: Self-pay

## 2021-11-05 ENCOUNTER — Encounter: Payer: Self-pay | Admitting: Internal Medicine

## 2021-11-05 NOTE — Telephone Encounter (Signed)
I called and spoke with the patient and informed him that her medication overlong and needles has arrived from patient assistance and she can come and pickup and she understood.  Stefannie Defeo,cma

## 2021-11-06 ENCOUNTER — Other Ambulatory Visit: Payer: Self-pay | Admitting: Internal Medicine

## 2021-11-06 DIAGNOSIS — Z853 Personal history of malignant neoplasm of breast: Secondary | ICD-10-CM

## 2021-11-06 DIAGNOSIS — M898X8 Other specified disorders of bone, other site: Secondary | ICD-10-CM

## 2021-11-06 DIAGNOSIS — M899 Disorder of bone, unspecified: Secondary | ICD-10-CM

## 2021-11-06 DIAGNOSIS — M25551 Pain in right hip: Secondary | ICD-10-CM

## 2021-11-06 DIAGNOSIS — R948 Abnormal results of function studies of other organs and systems: Secondary | ICD-10-CM

## 2021-11-06 NOTE — Telephone Encounter (Signed)
-----   Message from Delorise Jackson, MD sent at 11/02/2021 12:41 PM EST ----- of upper-outer quadrant of right breast in female, estrogen receptor positive (CMS-HCC)    Procedures  NM Bone Scan Whole Body  Laurance Flatten Marylen Ponto, MD  [address]  [place], [place] [address]  Phone: [phone]  Fax: [phone]   Bone scan new bone focus right iliac pelvic bone  Consider Xray pelvis is she agreeable [facility] OP imaging?  F/u oncology  Fax copy of scan to oncology

## 2021-11-07 ENCOUNTER — Other Ambulatory Visit: Payer: Self-pay

## 2021-11-07 ENCOUNTER — Ambulatory Visit
Admission: RE | Admit: 2021-11-07 | Discharge: 2021-11-07 | Disposition: A | Discharge status: Home / Self Care | Payer: PPO | Attending: Family Medicine | Admitting: Family Medicine

## 2021-11-07 ENCOUNTER — Ambulatory Visit
Admission: RE | Admit: 2021-11-07 | Discharge: 2021-11-07 | Disposition: A | Discharge status: Home / Self Care | Payer: PPO | Attending: Internal Medicine | Admitting: Internal Medicine

## 2021-11-07 ENCOUNTER — Other Ambulatory Visit (INDEPENDENT_AMBULATORY_CARE_PROVIDER_SITE_OTHER): Payer: PPO

## 2021-11-07 ENCOUNTER — Ambulatory Visit
Admission: RE | Admit: 2021-11-07 | Discharge: 2021-11-07 | Disposition: A | Discharge status: Home / Self Care | Payer: PPO | Source: Ambulatory Visit | Attending: Family Medicine | Admitting: Family Medicine

## 2021-11-07 DIAGNOSIS — G8929 Other chronic pain: Secondary | ICD-10-CM

## 2021-11-07 DIAGNOSIS — M47816 Spondylosis without myelopathy or radiculopathy, lumbar region: Secondary | ICD-10-CM | POA: Diagnosis not present

## 2021-11-07 DIAGNOSIS — R948 Abnormal results of function studies of other organs and systems: Secondary | ICD-10-CM | POA: Insufficient documentation

## 2021-11-07 DIAGNOSIS — M25551 Pain in right hip: Secondary | ICD-10-CM

## 2021-11-07 DIAGNOSIS — M899 Disorder of bone, unspecified: Secondary | ICD-10-CM

## 2021-11-07 DIAGNOSIS — Z853 Personal history of malignant neoplasm of breast: Secondary | ICD-10-CM | POA: Diagnosis not present

## 2021-11-07 DIAGNOSIS — E785 Hyperlipidemia, unspecified: Secondary | ICD-10-CM | POA: Diagnosis not present

## 2021-11-07 DIAGNOSIS — M5136 Other intervertebral disc degeneration, lumbar region: Secondary | ICD-10-CM | POA: Diagnosis not present

## 2021-11-07 DIAGNOSIS — E119 Type 2 diabetes mellitus without complications: Secondary | ICD-10-CM

## 2021-11-07 DIAGNOSIS — M545 Low back pain, unspecified: Secondary | ICD-10-CM | POA: Diagnosis not present

## 2021-11-07 DIAGNOSIS — M898X8 Other specified disorders of bone, other site: Secondary | ICD-10-CM | POA: Diagnosis not present

## 2021-11-07 DIAGNOSIS — R748 Abnormal levels of other serum enzymes: Secondary | ICD-10-CM | POA: Diagnosis not present

## 2021-11-07 DIAGNOSIS — E559 Vitamin D deficiency, unspecified: Secondary | ICD-10-CM

## 2021-11-07 DIAGNOSIS — M25552 Pain in left hip: Secondary | ICD-10-CM | POA: Diagnosis not present

## 2021-11-07 LAB — LIPID PANEL
Cholesterol: 227 mg/dL — ABNORMAL HIGH (ref 0–200)
HDL: 82.3 mg/dL (ref >39.00)
LDL Cholesterol: 129 mg/dL — ABNORMAL HIGH (ref 0–99)
NonHDL: 144.67
Total CHOL/HDL Ratio: 3
Triglycerides: 76 mg/dL (ref 0.0–149.0)
VLDL: 15.2 mg/dL (ref 0.0–40.0)

## 2021-11-07 LAB — COMPREHENSIVE METABOLIC PANEL
ALT: 31 U/L (ref 0–35)
AST: 29 U/L (ref 0–37)
Albumin: 4.1 g/dL (ref 3.5–5.2)
Alkaline Phosphatase: 176 U/L — ABNORMAL HIGH (ref 39–117)
BUN: 15 mg/dL (ref 6–23)
CO2: 23 mEq/L (ref 19–32)
Calcium: 9.4 mg/dL (ref 8.4–10.5)
Chloride: 96 mEq/L (ref 96–112)
Creatinine, Ser: 0.8 mg/dL (ref 0.40–1.20)
GFR: 78.2 mL/min (ref >60.00)
Glucose, Bld: 163 mg/dL — ABNORMAL HIGH (ref 70–99)
Potassium: 4.4 mEq/L (ref 3.5–5.1)
Sodium: 133 mEq/L — ABNORMAL LOW (ref 135–145)
Total Bilirubin: 0.4 mg/dL (ref 0.2–1.2)
Total Protein: 7.5 g/dL (ref 6.0–8.3)

## 2021-11-07 LAB — GAMMA GT: GGT: 324 U/L — ABNORMAL HIGH (ref 7–51)

## 2021-11-07 LAB — VITAMIN D 25 HYDROXY (VIT D DEFICIENCY, FRACTURES): VITD: 24.26 ng/mL — ABNORMAL LOW (ref 30.00–100.00)

## 2021-11-07 LAB — HEMOGLOBIN A1C: Hgb A1c MFr Bld: 8.5 % — ABNORMAL HIGH (ref 4.6–6.5)

## 2021-11-08 LAB — URINALYSIS, ROUTINE W REFLEX MICROSCOPIC
Bacteria, UA: NONE SEEN /HPF
Bilirubin Urine: NEGATIVE
Glucose, UA: NEGATIVE
Hgb urine dipstick: NEGATIVE
Hyaline Cast: NONE SEEN /LPF
Ketones, ur: NEGATIVE
Nitrite: NEGATIVE
Protein, ur: NEGATIVE
RBC / HPF: NONE SEEN /HPF (ref 0–2)
Specific Gravity, Urine: 1.004 (ref 1.001–1.035)
pH: 6.5 (ref 5.0–8.0)

## 2021-11-08 LAB — URINE CULTURE
MICRO NUMBER:: 12981196
Result:: NO GROWTH
SPECIMEN QUALITY:: ADEQUATE

## 2021-11-08 LAB — MICROALBUMIN / CREATININE URINE RATIO
Creatinine, Urine: 13 mg/dL — ABNORMAL LOW (ref 20–275)
Microalb, Ur: <0.2 mg/dL

## 2021-11-08 LAB — MICROSCOPIC MESSAGE

## 2021-11-09 ENCOUNTER — Other Ambulatory Visit: Payer: Self-pay

## 2021-11-09 ENCOUNTER — Ambulatory Visit (INDEPENDENT_AMBULATORY_CARE_PROVIDER_SITE_OTHER): Payer: PPO | Admitting: Family Medicine

## 2021-11-09 ENCOUNTER — Encounter: Payer: Self-pay | Admitting: Family Medicine

## 2021-11-09 VITALS — BP 110/80 | HR 88 | Temp 98.2°F | Ht 64.0 in | Wt 159.6 lb

## 2021-11-09 DIAGNOSIS — I10 Essential (primary) hypertension: Secondary | ICD-10-CM

## 2021-11-09 DIAGNOSIS — Z794 Long term (current) use of insulin: Secondary | ICD-10-CM

## 2021-11-09 DIAGNOSIS — M899 Disorder of bone, unspecified: Secondary | ICD-10-CM | POA: Diagnosis not present

## 2021-11-09 DIAGNOSIS — F32A Depression, unspecified: Secondary | ICD-10-CM | POA: Diagnosis not present

## 2021-11-09 DIAGNOSIS — F419 Anxiety disorder, unspecified: Secondary | ICD-10-CM | POA: Diagnosis not present

## 2021-11-09 DIAGNOSIS — E119 Type 2 diabetes mellitus without complications: Secondary | ICD-10-CM | POA: Diagnosis not present

## 2021-11-09 DIAGNOSIS — G4700 Insomnia, unspecified: Secondary | ICD-10-CM | POA: Diagnosis not present

## 2021-11-09 DIAGNOSIS — I493 Ventricular premature depolarization: Secondary | ICD-10-CM | POA: Diagnosis not present

## 2021-11-09 MED ORDER — DULOXETINE HCL 30 MG PO CPEP: ORAL_CAPSULE | ORAL | 3 refills | Status: DC

## 2021-11-09 MED ORDER — METOPROLOL SUCCINATE ER 25 MG PO TB24: 25.0000 mg | ORAL_TABLET | Freq: Every day | ORAL | 3 refills | Status: DC

## 2021-11-09 MED ORDER — ALPRAZOLAM 0.5 MG PO TABS: 0.5000 mg | ORAL_TABLET | Freq: Four times a day (QID) | ORAL | 0 refills | Status: DC | PRN

## 2021-11-09 MED ORDER — TRAZODONE HCL 50 MG PO TABS: ORAL_TABLET | ORAL | 2 refills | Status: DC

## 2021-11-09 NOTE — Assessment & Plan Note (Signed)
Generally adequate control.  We are increasing her metoprolol to 25 mg once daily to help with her palpitations.  She will monitor her blood pressure.  She will let us know if she develops any side effects with that change.

## 2021-11-09 NOTE — Assessment & Plan Note (Signed)
Anxiety has worsened.  We will start her back on Cymbalta 30 mg once daily for 14 days and then she will increase to 60 mg daily.  She can continue as needed Xanax.  She will follow-up in about 6 weeks.

## 2021-11-09 NOTE — Assessment & Plan Note (Signed)
Undetermined lesion seen on bone scan.  We are awaiting the x-ray results.

## 2021-11-09 NOTE — Progress Notes (Signed)
[name], MD Phone: [phone]  Kristina Mccoy is a 66 y.o. female who presents today for follow-up.  Hypertension: Generally less than 130/80.  Taking metoprolol.  No chest pain or shortness of breath.  Palpitations: Patient notes issues with feeling as though her heart flutters for a few seconds.  Notes it occurs at different times.  She had evaluation for this previously and she had a heart monitor that revealed rare PACs and PVCs.  She does take metoprolol.  Diabetes: A1c was 8.5.  She takes Basaglar 7 units once daily.  She takes sliding scale NovoLog based on the quantity of carbohydrates she is eating.  She has started to reintroduce some carbohydrates.  She follows with [facility]ocr[facility]ology at [facility].  She has only had 1 hypoglycemic episode in the last 90 days.  Anxiety/depression: Patient notes her depression is not so bad right now.  It comes and goes.  She lost her aunt and she thinks that her depression may be related to grief.  She does note anxiety has been up.  She notes trazodone did help her sleep that she has not taken it recently.  She does continue on Xanax.  She was previously on Cymbalta though this was discontinued given that the patient was on tamoxifen.  She has since discontinued tamoxifen as she could not tolerate this.  She notes no SI.  She notes music and reading help with the anxiety.  Bone lesion: Patient had a nuclear medicine bone scan that revealed a new focus of increased tracer uptake in the right iliac bone.  She had an x-ray to evaluate this further though the results have not returned yet.  Social History   Tobacco Use  Smoking Status Former   Packs/day: 1.00   Years: 30.00   Pack years: 30.00   Types: Cigarettes   Quit date: 02/28/2013   Years since quitting: 8.7  Smokeless Tobacco Never    Current Outpatient Medications on File Prior to Visit  Medication Sig Dispense Refill   albuterol (VENTOLIN HFA) 108 (90 Base) MCG/ACT inhaler Inhale 2  puffs into the lungs every 4 (four) hours as needed for wheezing or shortness of breath. 18 g 0   aspirin EC 81 MG tablet Take 1 tablet (81 mg total) by mouth daily. 90 tablet 3   Azelastine HCl 137 MCG/SPRAY SOLN PLACE 2 SPRAYS INTO BOTH NOSTRILS 2 (TWO) TIMES DAILY. USE IN EACH NOSTRIL AS DIRECTED 30 mL 2   Biotin 10 MG TABS Take by mouth.     BREO ELLIPTA 200-25 MCG/INH AEPB Inhale 1 puff into the lungs daily.     Cholecalciferol (VITAMIN D3) 1000 units CAPS Take by mouth daily.      clobetasol ointment (TEMOVATE) 0.05 % Apply topically. Vaginally     Continuous Blood Gluc Sensor (FREESTYLE LIBRE 14 DAY SENSOR) MISC SMARTSIG:1 Each Topical Every 2 Weeks     Cyanocobalamin 1000 MCG CAPS Take by mouth. Takes occassionally     Emollient (CERAVE) CREA Apply topically daily.      ibuprofen (ADVIL) 200 MG tablet Take 200 mg by mouth every 6 (six) hours as needed.     insulin aspart (NOVOLOG) 100 UNIT/ML FlexPen Inject 12 Units into the skin 3 (three) times daily with meals. 15 mL 0   Insulin Glargine (BASAGLAR KWIKPEN) 100 UNIT/ML Inject up to 20 units once daily as directed by endocrinology 15 mL 0   ketoconazole (NIZORAL) 2 % cream Apply 1 application topically 2 (two) times daily as  needed.  3   ketoconazole (NIZORAL) 2 % shampoo Apply 1 application topically as needed.  11   montelukast (SINGULAIR) 10 MG tablet TAKE 1 TABLET BY MOUTH EVERYDAY AT BEDTIME 90 tablet 1   ondansetron (ZOFRAN) 4 MG tablet TAKE 1 TABLET BY MOUTH EVERY 8 HOURS AS NEEDED FOR NAUSEA AND VOMITING 18 tablet 1   pilocarpine (SALAGEN) 5 MG tablet Take 5 mg by mouth 3 (three) times daily.     Polyethyl Glycol-Propyl Glycol (SYSTANE OP) Apply to eye.     pseudoephedrine-guaifenesin (MUCINEX D) 60-600 MG 12 hr tablet Take 1 tablet by mouth 2 (two) times daily as needed.      rOPINIRole (REQUIP) 0.25 MG tablet Take 0.25 mg by mouth daily as needed.     traMADol (ULTRAM) 50 MG tablet TAKE 1 TABLET BY MOUTH EVERY 6 HOURS AS NEEDED  FOR PAIN 92 tablet 1   triamcinolone (NASACORT) 55 MCG/ACT AERO nasal inhaler Place 2 sprays into the nose daily.     ursodiol (ACTIGALL) 500 MG tablet Take 1,000 mg by mouth daily.     famotidine (PEPCID) 20 MG tablet Take 1 tablet (20 mg total) by mouth at bedtime for 14 days. (Patient taking differently: Take 20 mg by mouth daily. LRN) 14 tablet 0   No current facility-administered medications on file prior to visit.     ROS see history of present illness  Objective  Physical Exam Vitals:   11/09/21 1346  BP: 110/80  Pulse: 88  Temp: 98.2 F (36.8 C)  SpO2: 99%    BP Readings from Last 3 Encounters:  11/09/21 110/80  10/26/21 126/78  09/15/21 (!) 158/86   Wt Readings from Last 3 Encounters:  11/09/21 159 lb 9.6 oz (72.4 kg)  10/26/21 158 lb (71.7 kg)  08/08/21 158 lb 3.2 oz (71.8 kg)    Physical Exam Constitutional:      General: She is not in acute distress.    Appearance: She is not diaphoretic.  Cardiovascular:     Rate and Rhythm: Normal rate and regular rhythm.     Heart sounds: Normal heart sounds.  Pulmonary:     Effort: Pulmonary effort is normal.     Breath sounds: Normal breath sounds.  Skin:    General: Skin is warm and dry.  Neurological:     Mental Status: She is alert.     Assessment/Plan: Please see individual problem list.  Problem List Items Addressed This Visit     Anxiety and depression (Chronic)    Anxiety has worsened.  We will start her back on Cymbalta 30 mg once daily for 14 days and then she will increase to 60 mg daily.  She can continue as needed Xanax.  She will follow-up in about 6 weeks.      Relevant Medications   DULoxetine (CYMBALTA) 30 MG capsule   ALPRAZolam (XANAX) 0.5 MG tablet   traZODone (DESYREL) 50 MG tablet   Bone lesion    Undetermined lesion seen on bone scan.  We are awaiting the x-ray results.      Hypertension    Generally adequate control.  We are increasing her metoprolol to 25 mg once daily to  help with her palpitations.  She will monitor her blood pressure.  She will let us know if she develops any side effects with that change.      Relevant Medications   metoprolol succinate (TOPROL-XL) 25 MG 24 hr tablet   Insomnia   Relevant Medications  traZODone (DESYREL) 50 MG tablet   PVC's (premature ventricular contractions)    These are likely the cause of her brief palpitations.  We will increase her metoprolol to 25 mg once daily to see if that helps control them a little better.  If she notices any side effects with this including fatigue or lightheadedness she will let us know.      Relevant Medications   metoprolol succinate (TOPROL-XL) 25 MG 24 hr tablet   Type 2 diabetes mellitus ([place]) - Primary    Uncontrolled.  She will increase her Basaglar by 1 unit every 3 days until her blood glucose is less than 130 fasting.  Discussed she should not go beyond 12 units and if she gets 12 units without her sugar being controlled she will let us know.  She will continue sliding scale NovoLog.  She will follow-up with endocrinology as planned.         Return in about 6 weeks (around 12/21/2021) for Anxiety/palpitation follow-up.  This visit occurred during the SARS-CoV-2 public health emergency.  Safety protocols were in place, including screening questions prior to the visit, additional usage of staff PPE, and extensive cleaning of exam room while observing appropriate contact time as indicated for disinfecting solutions.    [name], MD [place]

## 2021-11-09 NOTE — Patient Instructions (Addendum)
Nice to see you. We will start you on Cymbalta.  If you notice any side effects with this please let us know. You can take the trazodone for sleep.  If you notice excessive drowsiness please let us know. You can increase your Basaglar by 1 unit every 3 days until your fasting sugars are less than 130.  Do not go beyond 12 units of Basaglar.  Please let us know if you get to 12 units and your sugars are still not controlled.  If you start to have sugars less than 70 with this please eat something and contact us. We can increase your metoprolol to 25 mg once daily to see if that helps with your likely PVCs.

## 2021-11-09 NOTE — Assessment & Plan Note (Signed)
These are likely the cause of her brief palpitations.  We will increase her metoprolol to 25 mg once daily to see if that helps control them a little better.  If she notices any side effects with this including fatigue or lightheadedness she will let us know.

## 2021-11-09 NOTE — Assessment & Plan Note (Signed)
Uncontrolled.  She will increase her Basaglar by 1 unit every 3 days until her blood glucose is less than 130 fasting.  Discussed she should not go beyond 12 units and if she gets 12 units without her sugar being controlled she will let us know.  She will continue sliding scale NovoLog.  She will follow-up with endocrinology as planned.

## 2021-11-10 LAB — ALKALINE PHOSPHATASE, ISOENZYMES
Alkaline Phosphatase: 213 IU/L — ABNORMAL HIGH (ref 44–121)
BONE FRACTION: 23 % (ref 14–68)
INTESTINAL FRAC.: 0 % (ref 0–18)
LIVER FRACTION: 77 % (ref 18–85)

## 2021-11-10 LAB — SPECIMEN STATUS REPORT

## 2021-11-15 ENCOUNTER — Encounter: Payer: Self-pay | Admitting: Family Medicine

## 2021-11-21 NOTE — Addendum Note (Signed)
Addended by: Orland Mustard on: 11/21/2021 04:59 PM   Modules accepted: Orders

## 2021-11-22 DIAGNOSIS — G4733 Obstructive sleep apnea (adult) (pediatric): Secondary | ICD-10-CM | POA: Diagnosis not present

## 2021-12-03 DIAGNOSIS — E1165 Type 2 diabetes mellitus with hyperglycemia: Secondary | ICD-10-CM | POA: Diagnosis not present

## 2021-12-03 DIAGNOSIS — Z794 Long term (current) use of insulin: Secondary | ICD-10-CM | POA: Diagnosis not present

## 2021-12-03 DIAGNOSIS — M3500 Sicca syndrome, unspecified: Secondary | ICD-10-CM | POA: Diagnosis not present

## 2021-12-03 DIAGNOSIS — E785 Hyperlipidemia, unspecified: Secondary | ICD-10-CM | POA: Diagnosis not present

## 2021-12-03 DIAGNOSIS — Z6828 Body mass index (BMI) 28.0-28.9, adult: Secondary | ICD-10-CM | POA: Diagnosis not present

## 2021-12-04 ENCOUNTER — Other Ambulatory Visit: Payer: Self-pay

## 2021-12-04 ENCOUNTER — Ambulatory Visit
Admission: RE | Admit: 2021-12-04 | Discharge: 2021-12-04 | Disposition: A | Discharge status: Home / Self Care | Payer: PPO | Source: Ambulatory Visit | Attending: Internal Medicine | Admitting: Internal Medicine

## 2021-12-04 ENCOUNTER — Ambulatory Visit: Payer: Self-pay | Admitting: Pharmacist

## 2021-12-04 DIAGNOSIS — R748 Abnormal levels of other serum enzymes: Secondary | ICD-10-CM | POA: Insufficient documentation

## 2021-12-04 DIAGNOSIS — M47816 Spondylosis without myelopathy or radiculopathy, lumbar region: Secondary | ICD-10-CM | POA: Insufficient documentation

## 2021-12-04 DIAGNOSIS — M4697 Unspecified inflammatory spondylopathy, lumbosacral region: Secondary | ICD-10-CM | POA: Diagnosis not present

## 2021-12-04 DIAGNOSIS — R948 Abnormal results of function studies of other organs and systems: Secondary | ICD-10-CM | POA: Diagnosis not present

## 2021-12-04 DIAGNOSIS — M47817 Spondylosis without myelopathy or radiculopathy, lumbosacral region: Secondary | ICD-10-CM | POA: Insufficient documentation

## 2021-12-04 DIAGNOSIS — M160 Bilateral primary osteoarthritis of hip: Secondary | ICD-10-CM | POA: Diagnosis not present

## 2021-12-04 DIAGNOSIS — Z853 Personal history of malignant neoplasm of breast: Secondary | ICD-10-CM | POA: Diagnosis not present

## 2021-12-04 DIAGNOSIS — I700 Atherosclerosis of aorta: Secondary | ICD-10-CM | POA: Diagnosis not present

## 2021-12-04 DIAGNOSIS — M899 Disorder of bone, unspecified: Secondary | ICD-10-CM | POA: Insufficient documentation

## 2021-12-04 DIAGNOSIS — M461 Sacroiliitis, not elsewhere classified: Secondary | ICD-10-CM | POA: Diagnosis not present

## 2021-12-04 NOTE — Chronic Care Management (AMB) (Signed)
?  Chronic Care Management  ? ?Note ? ?12/04/2021 ?Name: Kristina Mccoy MRN: [id number] DOB: Mar 12, 1956 ? ? ? ?Closing pharmacy CCM case at this time.  Patient has clinic contact information for future questions or concerns.  ? ?Catie Darnelle Maffucci, PharmD, [place], CPP ?Clinical Pharmacist ?Therapist, music at [employer] ?[phone] ? ?

## 2021-12-04 NOTE — Patient Instructions (Addendum)
Hi Morene Antu,  ? ?I am being asked to quickly transition into another role within the health system, so I am unable to keep our next appointment. Please continue to follow up with your primary care provider as scheduled.  ? ?As a reminder -  ? ?The [employer] Patient Assistance Program for Novolog has an auto-refill program where you do not need to call and request a refill each time. You should receive your medication shipment before you run out of your medication. However, if you have any issues or need assistance, you can call them at [phone]. They are available Monday though Friday 8:00 AM - 8:00 PM.  ? ?The [employer] Patient Assistance Program for Nancee Liter has an auto-refill program where you do not need to call and request a refill each time. You should receive your medication shipment before you run out of your medication. However, if you have any issues or need assistance, you can call them at [phone]. They are available Monday though Friday 8:00 AM - 5:00 PM.  ? ? ?It has been a pleasure working with you! Keep up the great work ? ?Catie Darnelle Maffucci, PharmD ? ?

## 2021-12-06 ENCOUNTER — Other Ambulatory Visit: Payer: Self-pay | Admitting: Family Medicine

## 2021-12-06 DIAGNOSIS — F32A Depression, unspecified: Secondary | ICD-10-CM

## 2021-12-07 ENCOUNTER — Encounter: Payer: Self-pay | Admitting: Family Medicine

## 2021-12-07 ENCOUNTER — Other Ambulatory Visit: Payer: Self-pay | Admitting: *Deleted

## 2021-12-07 DIAGNOSIS — Z87891 Personal history of nicotine dependence: Secondary | ICD-10-CM

## 2021-12-10 ENCOUNTER — Telehealth: Payer: Self-pay

## 2021-12-10 NOTE — Telephone Encounter (Signed)
Error

## 2021-12-11 ENCOUNTER — Other Ambulatory Visit: Payer: Self-pay | Admitting: Family Medicine

## 2021-12-11 DIAGNOSIS — R948 Abnormal results of function studies of other organs and systems: Secondary | ICD-10-CM

## 2021-12-19 ENCOUNTER — Ambulatory Visit: Payer: PPO

## 2021-12-21 ENCOUNTER — Other Ambulatory Visit: Payer: Self-pay

## 2021-12-21 ENCOUNTER — Ambulatory Visit (INDEPENDENT_AMBULATORY_CARE_PROVIDER_SITE_OTHER): Payer: PPO | Admitting: Family Medicine

## 2021-12-21 ENCOUNTER — Encounter: Payer: Self-pay | Admitting: Family Medicine

## 2021-12-21 VITALS — BP 110/70 | HR 80 | Temp 98.3°F | Ht 64.0 in | Wt 166.2 lb

## 2021-12-21 DIAGNOSIS — E109 Type 1 diabetes mellitus without complications: Secondary | ICD-10-CM | POA: Diagnosis not present

## 2021-12-21 DIAGNOSIS — M899 Disorder of bone, unspecified: Secondary | ICD-10-CM | POA: Diagnosis not present

## 2021-12-21 DIAGNOSIS — M3500 Sicca syndrome, unspecified: Secondary | ICD-10-CM

## 2021-12-21 DIAGNOSIS — F419 Anxiety disorder, unspecified: Secondary | ICD-10-CM

## 2021-12-21 DIAGNOSIS — F32A Depression, unspecified: Secondary | ICD-10-CM | POA: Diagnosis not present

## 2021-12-21 DIAGNOSIS — G8929 Other chronic pain: Secondary | ICD-10-CM | POA: Diagnosis not present

## 2021-12-21 DIAGNOSIS — M545 Low back pain, unspecified: Secondary | ICD-10-CM

## 2021-12-21 NOTE — Assessment & Plan Note (Signed)
Recent change to her diagnosis.  It appears she had antibody testing through endocrinology at [facility] that confirmed this diagnosis.  I reinforced endocrinology's instructions for her Basaglar and these were included in the patient's AVS and are outlined below.  She will increase to 10 units of Basaglar at this time given that her sugars do not seem controlled.  Discussed that she could increase her Basaglar by 1 unit every week until her glucose is in the range of 80-130 fasting as previously advised by her endocrinologist. ? ?The below instructions are from the AVS of her endocrinologist. ?"I think we should go up to 9u on [place] (after your test tomorrow). ?If your blood sugars in the morning are still >120, I would increase to 10u. ?You can keep doing this every week to target fasting blood sugars between 80-130." ? ?

## 2021-12-21 NOTE — Assessment & Plan Note (Signed)
Chronic issues with this.  Degenerative changes found on recent CT abdomen and pelvis.  We will refer to the pain management to see what kind of interventional treatments they can provide for the patient. ?

## 2021-12-21 NOTE — Assessment & Plan Note (Signed)
Discussed that it is reassuring that the x-ray and CT imaging did not reveal a cause for the possible lesion seen on the bone scan.  She has an MRI to complete this evaluation and if there is nothing on that that corresponds to the finding on the bone scan I think she will not need any further work-up. ?

## 2021-12-21 NOTE — Assessment & Plan Note (Signed)
She will continue to follow with endocrinology. 

## 2021-12-21 NOTE — Progress Notes (Signed)
?[name], MD ?Phone: [phone] ? ?Kristina Mccoy is a 66 y.o. female who presents today for follow-up. ? ?Anxiety/depression: Patient notes not so much depression.  She does occasionally have anxiety and takes Xanax for that.  She had side effects with the Cymbalta and stopped this. ? ?Sjogren's: She continues to see rheumatology.  She has chronic pain and fatigue related to this. ? ?Chronic low back pain: She had a recent CT scan that revealed degenerative/arthritic changes.  She uses back support and Advil and tramadol for this. ? ?Diabetes: Patient was recently diagnosed with type 1 diabetes instead of type II.  She has been following with endocrinology at [facility].  She has been on Basaglar 9 units once daily and a sliding scale of her NovoLog.  She notes she sees a diabetic educator in the near future.  She notes worsened polyuria over the past 3 months.  She has had 1 hypoglycemic episode in last 14 days and she ate some chocolate for this.  She continues to use her freestyle libre.  She has fasting sugars that appear to be 140 or above.  She has more elevations above goal numbers at night. ? ?Social History  ? ?Tobacco Use  ?Smoking Status Former  ? Packs/day: 1.00  ? Years: 30.00  ? Pack years: 30.00  ? Types: Cigarettes  ? Quit date: 02/28/2013  ? Years since quitting: 8.8  ?Smokeless Tobacco Never  ? ? ?Current Outpatient Medications on File Prior to Visit  ?Medication Sig Dispense Refill  ? albuterol (VENTOLIN HFA) 108 (90 Base) MCG/ACT inhaler Inhale 2 puffs into the lungs every 4 (four) hours as needed for wheezing or shortness of breath. 18 g 0  ? ALPRAZolam (XANAX) 0.5 MG tablet TAKE 1 TABLET (0.5 MG TOTAL) BY MOUTH 4 (FOUR) TIMES DAILY AS NEEDED FOR ANXIETY. 120 tablet 0  ? aspirin EC 81 MG tablet Take 1 tablet (81 mg total) by mouth daily. 90 tablet 3  ? Azelastine HCl 137 MCG/SPRAY SOLN PLACE 2 SPRAYS INTO BOTH NOSTRILS 2 (TWO) TIMES DAILY. USE IN EACH NOSTRIL AS DIRECTED 30 mL 2  ? Biotin  10 MG TABS Take by mouth.    ? BREO ELLIPTA 200-25 MCG/INH AEPB Inhale 1 puff into the lungs daily.    ? Cholecalciferol (VITAMIN D3) 1000 units CAPS Take by mouth daily.     ? clobetasol ointment (TEMOVATE) 0.05 % Apply topically. Vaginally    ? Continuous Blood Gluc Sensor (FREESTYLE LIBRE 14 DAY SENSOR) MISC SMARTSIG:1 Each Topical Every 2 Weeks    ? Cyanocobalamin 1000 MCG CAPS Take by mouth. Takes occassionally    ? DULoxetine (CYMBALTA) 30 MG capsule Take 1 capsule (30 mg total) by mouth daily for 14 days, THEN 2 capsules (60 mg total) daily. 180 capsule 3  ? Emollient (CERAVE) CREA Apply topically daily.     ? Glucagon 3 MG/DOSE POWD 1 spray into the left nostril every fifteen (15) minutes as needed (low blood sugar requiring assistance).    ? hydrocortisone 2.5 % cream APPLY TO AFFECTED AREA TWICE A DAY    ? ibuprofen (ADVIL) 200 MG tablet Take 200 mg by mouth every 6 (six) hours as needed.    ? insulin aspart (NOVOLOG) 100 UNIT/ML FlexPen Inject 12 Units into the skin 3 (three) times daily with meals. 15 mL 0  ? Insulin Glargine (BASAGLAR KWIKPEN) 100 UNIT/ML Inject up to 20 units once daily as directed by endocrinology 15 mL 0  ? ketoconazole (NIZORAL)  2 % cream Apply 1 application topically 2 (two) times daily as needed.  3  ? ketoconazole (NIZORAL) 2 % shampoo Apply 1 application topically as needed.  11  ? metoprolol succinate (TOPROL-XL) 25 MG 24 hr tablet Take 1 tablet (25 mg total) by mouth daily. 90 tablet 3  ? montelukast (SINGULAIR) 10 MG tablet TAKE 1 TABLET BY MOUTH EVERYDAY AT BEDTIME 90 tablet 1  ? ondansetron (ZOFRAN) 4 MG tablet TAKE 1 TABLET BY MOUTH EVERY 8 HOURS AS NEEDED FOR NAUSEA AND VOMITING 18 tablet 1  ? pilocarpine (SALAGEN) 5 MG tablet Take 5 mg by mouth 3 (three) times daily.    ? Polyethyl Glycol-Propyl Glycol (SYSTANE OP) Apply to eye.    ? pseudoephedrine-guaifenesin (MUCINEX D) 60-600 MG 12 hr tablet Take 1 tablet by mouth 2 (two) times daily as needed.     ? rOPINIRole  (REQUIP) 0.25 MG tablet Take 0.25 mg by mouth daily as needed.    ? traMADol (ULTRAM) 50 MG tablet TAKE 1 TABLET BY MOUTH EVERY 6 HOURS AS NEEDED FOR PAIN 92 tablet 1  ? traZODone (DESYREL) 50 MG tablet TAKE 0.5-1 TABLETS BY MOUTH AT BEDTIME AS NEEDED FOR SLEEP. 90 tablet 2  ? triamcinolone (NASACORT) 55 MCG/ACT AERO nasal inhaler Place 2 sprays into the nose daily.    ? ursodiol (ACTIGALL) 500 MG tablet Take 1,000 mg by mouth daily.    ? ezetimibe (ZETIA) 10 MG tablet Take 10 mg by mouth daily. (Patient not taking: Reported on 12/21/2021)    ? famotidine (PEPCID) 20 MG tablet Take 1 tablet (20 mg total) by mouth at bedtime for 14 days. (Patient taking differently: Take 20 mg by mouth daily. LRN) 14 tablet 0  ? ?No current facility-administered medications on file prior to visit.  ? ? ? ?ROS see history of present illness ? ?Objective ? ?Physical Exam ?Vitals:  ? 12/21/21 1532  ?BP: 110/70  ?Pulse: 80  ?Temp: 98.3 ?F (36.8 ?C)  ?SpO2: 99%  ? ? ?BP Readings from Last 3 Encounters:  ?12/21/21 110/70  ?11/09/21 110/80  ?10/26/21 126/78  ? ?Wt Readings from Last 3 Encounters:  ?12/21/21 166 lb 3.2 oz (75.4 kg)  ?11/09/21 159 lb 9.6 oz (72.4 kg)  ?10/26/21 158 lb (71.7 kg)  ? ? ?Physical Exam ?Constitutional:   ?   General: She is not in acute distress. ?   Appearance: She is not diaphoretic.  ?Cardiovascular:  ?   Rate and Rhythm: Normal rate and regular rhythm.  ?   Heart sounds: Normal heart sounds.  ?Pulmonary:  ?   Effort: Pulmonary effort is normal.  ?   Breath sounds: Normal breath sounds.  ?Musculoskeletal:  ?   Comments: No midline spine tenderness, no midline spine step-off, lumbar muscular back tenderness throughout  ?Skin: ?   General: Skin is warm and dry.  ?Neurological:  ?   Mental Status: She is alert.  ? ? ? ?Assessment/Plan: Please see individual problem list. ? ?Problem List Items Addressed This Visit   ? ? Anxiety and depression (Chronic)  ?  Generally stable at this time.  She will continue Xanax as  prescribed as needed.  Given side effect issues with the Cymbalta we will hold off on initiating any other daily medications at this time as she seems to be fairly well controlled. ?  ?  ? Sjogren's syndrome ([place]) (Chronic)  ?  She will continue to follow with endocrinology. ?  ?  ? Bone lesion  ?  Discussed that it is reassuring that the x-ray and CT imaging did not reveal a cause for the possible lesion seen on the bone scan.  She has an MRI to complete this evaluation and if there is nothing on that that corresponds to the finding on the bone scan I think she will not need any further work-up. ?  ?  ? Chronic bilateral low back pain without sciatica - Primary  ?  Chronic issues with this.  Degenerative changes found on recent CT abdomen and pelvis.  We will refer to the pain management to see what kind of interventional treatments they can provide for the patient. ?  ?  ? Relevant Orders  ? Ambulatory referral to Pain Clinic  ? Type 1 diabetes ([place])  ?  Recent change to her diagnosis.  It appears she had antibody testing through endocrinology at [facility] that confirmed this diagnosis.  I reinforced endocrinology's instructions for her Basaglar and these were included in the patient's AVS and are outlined below.  She will increase to 10 units of Basaglar at this time given that her sugars do not seem controlled.  Discussed that she could increase her Basaglar by 1 unit every week until her glucose is in the range of 80-130 fasting as previously advised by her endocrinologist. ? ?The below instructions are from the AVS of her endocrinologist. ?"I think we should go up to 9u on [place] (after your test tomorrow). ?If your blood sugars in the morning are still >120, I would increase to 10u. ?You can keep doing this every week to target fasting blood sugars between 80-130." ? ?  ?  ? Relevant Medications  ? Glucagon 3 MG/DOSE POWD  ? ? ? ?Return in about 3 months (around 03/23/2022). ? ?This visit occurred during the  SARS-CoV-2 public health emergency.  Safety protocols were in place, including screening questions prior to the visit, additional usage of staff PPE, and extensive cleaning of exam room while observing appropria

## 2021-12-21 NOTE — Patient Instructions (Addendum)
Nice to see you. ?We will contact you with the results of the MRI once it is completed. ?Pain management should contact you to set up an evaluation for your back pain. ?Here are the instructions from Dr. Olin Pia after visit summary for your Basaglar. ? ?"I think we should go up to 9u on Basag[place] (after your test tomorrow). ?If your blood sugars in the morning are still >120, I would increase to 10u. ?You can keep doing this every week to target fasting blood sugars between 80-130." ?

## 2021-12-21 NOTE — Assessment & Plan Note (Signed)
Generally stable at this time.  She will continue Xanax as prescribed as needed.  Given side effect issues with the Cymbalta we will hold off on initiating any other daily medications at this time as she seems to be fairly well controlled. ?

## 2021-12-26 ENCOUNTER — Ambulatory Visit: Payer: PPO

## 2021-12-27 ENCOUNTER — Ambulatory Visit
Admission: RE | Admit: 2021-12-27 | Discharge: 2021-12-27 | Disposition: A | Discharge status: Home / Self Care | Payer: PPO | Source: Ambulatory Visit | Attending: Family Medicine | Admitting: Family Medicine

## 2021-12-27 DIAGNOSIS — M533 Sacrococcygeal disorders, not elsewhere classified: Secondary | ICD-10-CM | POA: Diagnosis not present

## 2021-12-27 DIAGNOSIS — M160 Bilateral primary osteoarthritis of hip: Secondary | ICD-10-CM | POA: Diagnosis not present

## 2021-12-27 DIAGNOSIS — Z794 Long term (current) use of insulin: Secondary | ICD-10-CM | POA: Diagnosis not present

## 2021-12-27 DIAGNOSIS — R948 Abnormal results of function studies of other organs and systems: Secondary | ICD-10-CM | POA: Diagnosis not present

## 2021-12-27 DIAGNOSIS — E1165 Type 2 diabetes mellitus with hyperglycemia: Secondary | ICD-10-CM | POA: Diagnosis not present

## 2021-12-27 DIAGNOSIS — M5136 Other intervertebral disc degeneration, lumbar region: Secondary | ICD-10-CM | POA: Diagnosis not present

## 2021-12-27 DIAGNOSIS — K5730 Diverticulosis of large intestine without perforation or abscess without bleeding: Secondary | ICD-10-CM | POA: Diagnosis not present

## 2021-12-27 MED ORDER — GADOBUTROL 1 MMOL/ML IV SOLN
7.0000 mL | Freq: Once | INTRAVENOUS | Status: AC | PRN
  Administered 2021-12-27: 7 mL via INTRAVENOUS

## 2021-12-31 ENCOUNTER — Other Ambulatory Visit: Payer: Self-pay | Admitting: Family Medicine

## 2021-12-31 ENCOUNTER — Telehealth: Payer: Self-pay | Admitting: Family Medicine

## 2021-12-31 DIAGNOSIS — R948 Abnormal results of function studies of other organs and systems: Secondary | ICD-10-CM

## 2021-12-31 NOTE — Telephone Encounter (Signed)
I left pt vm to call ofc to sch NM bone scan. Thank you! ?

## 2022-01-07 ENCOUNTER — Telehealth: Payer: Self-pay

## 2022-01-07 ENCOUNTER — Telehealth: Payer: Self-pay | Admitting: Family Medicine

## 2022-01-07 DIAGNOSIS — F419 Anxiety disorder, unspecified: Secondary | ICD-10-CM

## 2022-01-07 NOTE — Telephone Encounter (Signed)
Pt called in requesting refill for medication (ALPRAZolam (XANAX) 0.5 MG tablet)... Pt requesting callback ?

## 2022-01-07 NOTE — Telephone Encounter (Signed)
Informed pt that Nancee Liter is ready for P/U. Medicine has been placed in patient assistance fridge. ?

## 2022-01-07 NOTE — Telephone Encounter (Signed)
PATIENT CAME IN TODAY TO PICK UP MEDICATION ( BASAGLAR) FROM PATIENT ASSISTANCE ON 01/07/22 AT 1:50 PM ?

## 2022-01-08 ENCOUNTER — Other Ambulatory Visit: Payer: Self-pay | Admitting: Family Medicine

## 2022-01-08 DIAGNOSIS — F419 Anxiety disorder, unspecified: Secondary | ICD-10-CM

## 2022-01-08 MED ORDER — ALPRAZOLAM 0.5 MG PO TABS: 0.5000 mg | ORAL_TABLET | Freq: Four times a day (QID) | ORAL | 0 refills | Status: DC | PRN

## 2022-01-08 NOTE — Telephone Encounter (Signed)
Sent to pharmacy 

## 2022-01-08 NOTE — Telephone Encounter (Signed)
Pt called about previous message for refill on medication. Pt want a call back  ?

## 2022-01-10 ENCOUNTER — Other Ambulatory Visit: Payer: Self-pay

## 2022-01-10 DIAGNOSIS — E119 Type 2 diabetes mellitus without complications: Secondary | ICD-10-CM

## 2022-01-10 MED ORDER — INSULIN PEN NEEDLE 32G X 6 MM MISC: 3 refills | Status: AC

## 2022-01-10 MED ORDER — INSULIN ASPART 100 UNIT/ML FLEXPEN: 12.0000 [IU] | PEN_INJECTOR | Freq: Three times a day (TID) | SUBCUTANEOUS | 3 refills | Status: DC

## 2022-01-18 DIAGNOSIS — M3508 Sjogren syndrome with gastrointestinal involvement: Secondary | ICD-10-CM | POA: Diagnosis not present

## 2022-01-18 DIAGNOSIS — K743 Primary biliary cirrhosis: Secondary | ICD-10-CM | POA: Diagnosis not present

## 2022-01-18 DIAGNOSIS — G63 Polyneuropathy in diseases classified elsewhere: Secondary | ICD-10-CM | POA: Diagnosis not present

## 2022-01-18 DIAGNOSIS — M159 Polyosteoarthritis, unspecified: Secondary | ICD-10-CM | POA: Diagnosis not present

## 2022-01-18 DIAGNOSIS — M3506 Sjogren syndrome with peripheral nervous system involvement: Secondary | ICD-10-CM | POA: Diagnosis not present

## 2022-01-30 ENCOUNTER — Telehealth: Payer: Self-pay

## 2022-01-30 NOTE — Telephone Encounter (Signed)
I called and spoke with the patient and informed her that her novolog and needles has arrived and she stated she would be by to pick up. Haizley Cannella,cma  ?

## 2022-02-01 ENCOUNTER — Telehealth: Payer: Self-pay

## 2022-02-01 NOTE — Telephone Encounter (Signed)
Medications (Novolog) came in to the office today and I called and notified the patient and she understood.  Jenelle Drennon,cma  ?

## 2022-02-05 ENCOUNTER — Other Ambulatory Visit: Payer: Self-pay | Admitting: Family Medicine

## 2022-02-05 DIAGNOSIS — F419 Anxiety disorder, unspecified: Secondary | ICD-10-CM

## 2022-02-06 DIAGNOSIS — K581 Irritable bowel syndrome with constipation: Secondary | ICD-10-CM | POA: Diagnosis not present

## 2022-02-06 DIAGNOSIS — R140 Abdominal distension (gaseous): Secondary | ICD-10-CM | POA: Diagnosis not present

## 2022-02-06 DIAGNOSIS — R110 Nausea: Secondary | ICD-10-CM | POA: Diagnosis not present

## 2022-02-06 DIAGNOSIS — Z8371 Family history of colonic polyps: Secondary | ICD-10-CM | POA: Diagnosis not present

## 2022-02-06 DIAGNOSIS — K219 Gastro-esophageal reflux disease without esophagitis: Secondary | ICD-10-CM | POA: Diagnosis not present

## 2022-02-18 DIAGNOSIS — Z789 Other specified health status: Secondary | ICD-10-CM | POA: Insufficient documentation

## 2022-02-19 ENCOUNTER — Telehealth: Payer: Self-pay

## 2022-02-19 DIAGNOSIS — Z789 Other specified health status: Secondary | ICD-10-CM | POA: Diagnosis not present

## 2022-02-19 DIAGNOSIS — R002 Palpitations: Secondary | ICD-10-CM | POA: Diagnosis not present

## 2022-02-19 DIAGNOSIS — G4733 Obstructive sleep apnea (adult) (pediatric): Secondary | ICD-10-CM | POA: Diagnosis not present

## 2022-02-19 DIAGNOSIS — I10 Essential (primary) hypertension: Secondary | ICD-10-CM | POA: Diagnosis not present

## 2022-02-19 DIAGNOSIS — E1169 Type 2 diabetes mellitus with other specified complication: Secondary | ICD-10-CM | POA: Diagnosis not present

## 2022-02-19 DIAGNOSIS — R0602 Shortness of breath: Secondary | ICD-10-CM | POA: Diagnosis not present

## 2022-02-19 DIAGNOSIS — I219 Acute myocardial infarction, unspecified: Secondary | ICD-10-CM | POA: Diagnosis not present

## 2022-02-19 DIAGNOSIS — R079 Chest pain, unspecified: Secondary | ICD-10-CM | POA: Diagnosis not present

## 2022-02-19 DIAGNOSIS — E785 Hyperlipidemia, unspecified: Secondary | ICD-10-CM | POA: Diagnosis not present

## 2022-02-19 NOTE — Telephone Encounter (Signed)
I called and LVM for the patient to pick up her Basgalar medication here at the office.  Cormac Wint,cma

## 2022-02-27 ENCOUNTER — Ambulatory Visit: Payer: PPO | Admitting: Internal Medicine

## 2022-03-06 ENCOUNTER — Encounter: Payer: Self-pay | Admitting: Internal Medicine

## 2022-03-06 ENCOUNTER — Ambulatory Visit: Payer: PPO | Admitting: Internal Medicine

## 2022-03-06 ENCOUNTER — Other Ambulatory Visit: Payer: Self-pay | Admitting: Family Medicine

## 2022-03-06 VITALS — BP 126/76 | HR 70 | Temp 97.8°F | Ht 64.0 in | Wt 166.2 lb

## 2022-03-06 DIAGNOSIS — J449 Chronic obstructive pulmonary disease, unspecified: Secondary | ICD-10-CM | POA: Diagnosis not present

## 2022-03-06 DIAGNOSIS — F419 Anxiety disorder, unspecified: Secondary | ICD-10-CM

## 2022-03-06 MED ORDER — BREZTRI AEROSPHERE 160-9-4.8 MCG/ACT IN AERO: 2.0000 | INHALATION_SPRAY | Freq: Two times a day (BID) | RESPIRATORY_TRACT | 4 refills | Status: DC

## 2022-03-06 NOTE — Telephone Encounter (Signed)
Pt picked up medication.

## 2022-03-06 NOTE — Progress Notes (Signed)
[facility]   PFTs and mild restriction, RV 74, DLCO 61. Sleep study February 2016 AHI 14.3, AutoPap 5-15.  **Personally reviewed download data, 30 days as of 01/07/18.  Usage greater than 4 hours is 26/30 days.  Average usage on days used is 5 hours 50 minutes.  Set pressure is 8.  Residual AHI is 1.  This demonstrates very good compliance with excellent control of obstructive sleep apnea. **Sleep study, CPAP titration 04/09/16, CPAP was recommended at a pressure of 8.  **Imaging personally reviewed, 01/16/17; lungs unremarkable. **CBC 12/22/17, absolute eosinophil count 300.   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 Patient has history of breast cancer status post XRT Follow-up CT chest pending  Breo is not helping we will switch to [facility] inhaler therapy     h/o RT breast cancer bilateral mastectomy Surgical Hx:  Past Surgical History:  Procedure Laterality Date   BREAST  SURGERY Bilateral    mastectomy   COLONOSCOPY WITH PROPOFOL N/A 04/22/2017   Procedure: COLONOSCOPY WITH PROPOFOL;  Surgeon: Lollie Sails, MD;  [facility] ENDOSCOPY;  Service: Endoscopy;  Laterality: N/A;   ESOPHAGOGASTRODUODENOSCOPY (EGD) WITH PROPOFOL N/A 04/22/2017   Procedure: ESOPHAGOGASTRODUODENOSCOPY (EGD) WITH PROPOFOL;  Surgeon: Lollie Sails, MD;  Location: [facility] ENDOSCOPY;  Service: Endoscopy;  Laterality: N/A;   MASTECTOMY Bilateral 03/23/13   Family Hx:  Family History  Problem Relation Age of Onset   Diabetes Mother    Heart disease Mother    Hyperlipidemia Mother    Heart failure Mother    Diabetes Other    Pancreatic cancer Father    Heart disease Brother        Aortic valve disease   Hyperlipidemia Brother    Colon polyps Brother    Stroke Maternal Grandmother    Breast cancer Maternal Aunt    Colon cancer Paternal Grandmother    Rheum arthritis Sister    Psoriasis Other        psoriatic arthritis    Heart disease Brother 79       Tachycardia   Heart disease Brother    Ovarian cancer Neg Hx    Social Hx:   Social History   Tobacco Use   Smoking status: Former    Packs/day: 1.00    Years: 30.00    Pack years: 30.00    Types: Cigarettes    Quit date: 02/28/2013    Years since quitting: 9.0  Smokeless tobacco: Never  Vaping Use   Vaping Use: Never used  Substance Use Topics   Alcohol use: No    Alcohol/week: 0.0 standard drinks   Drug use: No   Medication:    Current Outpatient Medications:    albuterol (VENTOLIN HFA) 108 (90 Base) MCG/ACT inhaler, Inhale 2 puffs into the lungs every 4 (four) hours as needed for wheezing or shortness of breath., Disp: 18 g, Rfl: 0   ALPRAZolam (XANAX) 0.5 MG tablet, TAKE 1 TABLET BY MOUTH 4 TIMES A DAY AS NEEDED FOR ANXIETY, Disp: 120 tablet, Rfl: 0   aspirin EC 81 MG tablet, Take 1 tablet (81 mg total) by mouth daily., Disp: 90 tablet, Rfl: 3   Azelastine HCl 137 MCG/SPRAY SOLN, PLACE 2 SPRAYS INTO  BOTH NOSTRILS 2 (TWO) TIMES DAILY. USE IN EACH NOSTRIL AS DIRECTED, Disp: 30 mL, Rfl: 2   Biotin 10 MG TABS, Take by mouth., Disp: , Rfl:    BREO ELLIPTA 200-25 MCG/INH AEPB, Inhale 1 puff into the lungs daily., Disp: , Rfl:    Cholecalciferol (VITAMIN D3) 1000 units CAPS, Take by mouth daily. , Disp: , Rfl:    Continuous Blood Gluc Sensor (FREESTYLE LIBRE 14 DAY SENSOR) MISC, SMARTSIG:1 Each Topical Every 2 Weeks, Disp: , Rfl:    Cyanocobalamin 1000 MCG CAPS, Take by mouth. Takes occassionally, Disp: , Rfl:    Emollient (CERAVE) CREA, Apply topically daily. , Disp: , Rfl:    ezetimibe (ZETIA) 10 MG tablet, Take 10 mg by mouth daily., Disp: , Rfl:    Glucagon 3 MG/DOSE POWD, 1 spray into the left nostril every fifteen (15) minutes as needed (low blood sugar requiring assistance)., Disp: , Rfl:    hydrocortisone 2.5 % cream, APPLY TO AFFECTED AREA TWICE A DAY, Disp: , Rfl:    ibuprofen (ADVIL) 200 MG tablet, Take 200 mg by mouth every 6 (six) hours as needed., Disp: , Rfl:    insulin aspart (NOVOLOG) 100 UNIT/ML FlexPen, Inject 12 Units into the skin 3 (three) times daily with meals., Disp: 15 mL, Rfl: 3   Insulin Glargine (BASAGLAR KWIKPEN) 100 UNIT/ML, Inject up to 20 units once daily as directed by endocrinology, Disp: 15 mL, Rfl: 0   Insulin Pen Needle 32G X 6 MM MISC, Use 3 times daily with meals., Disp: 1 each, Rfl: 3   ketoconazole (NIZORAL) 2 % cream, Apply 1 application topically 2 (two) times daily as needed., Disp: , Rfl: 3   ketoconazole (NIZORAL) 2 % shampoo, Apply 1 application topically as needed., Disp: , Rfl: 11   metoprolol succinate (TOPROL-XL) 25 MG 24 hr tablet, Take 1 tablet (25 mg total) by mouth daily., Disp: 90 tablet, Rfl: 3   ondansetron (ZOFRAN) 4 MG tablet, TAKE 1 TABLET BY MOUTH EVERY 8 HOURS AS NEEDED FOR NAUSEA AND VOMITING, Disp: 18 tablet, Rfl: 1   pseudoephedrine-guaifenesin (MUCINEX D) 60-600 MG 12 hr tablet, Take 1 tablet by mouth 2 (two) times daily as needed. ,  Disp: , Rfl:    traMADol (ULTRAM) 50 MG tablet, TAKE 1 TABLET BY MOUTH EVERY 6 HOURS AS NEEDED FOR PAIN, Disp: 90 tablet, Rfl: 0   traZODone (DESYREL) 50 MG tablet, TAKE 0.5-1 TABLETS BY MOUTH AT BEDTIME AS NEEDED FOR SLEEP., Disp: 90 tablet, Rfl: 2   triamcinolone (NASACORT) 55 MCG/ACT AERO nasal inhaler, Place 2 sprays into the nose daily., Disp: , Rfl:    ursodiol (ACTIGALL) 500 MG tablet, Take 1,000 mg by mouth daily., Disp: , Rfl:  famotidine (PEPCID) 20 MG tablet, Take 1 tablet (20 mg total) by mouth at bedtime for 14 days. (Patient taking differently: Take 20 mg by mouth daily. LRN), Disp: 14 tablet, Rfl: 0   rOPINIRole (REQUIP) 0.25 MG tablet, Take 0.25 mg by mouth daily as needed. (Patient not taking: Reported on 03/06/2022), Disp: , Rfl:    Allergies:  Gabapentin, Oxycodone, Repatha [evolocumab], Carbamazepine, Doxycycline, Dulaglutide, Ezetimibe, Insulin glargine, Levaquin [levofloxacin], Metformin, Methylprednisolone, Morphine, Pregabalin, Rosuvastatin, Venlafaxine, Buprenorphine, Canagliflozin, Escitalopram, and Hydroxychloroquine     Review of Systems: Gen:  Denies  fever, sweats, chills weight loss  HEENT: Denies blurred vision, double vision, ear pain, eye pain, hearing loss, nose bleeds, sore throat Cardiac:  No dizziness, chest pain or heaviness, chest tightness,edema, No JVD Resp:   No cough, -sputum production, -shortness of breath,-wheezing, -hemoptysis,  Other:  All other systems negative  BP 126/76 (BP Location: Left Arm, Cuff Size: Large)   Pulse 70   Temp 97.8 F (36.6 C) (Temporal)   Ht '5\' 4"'$  (1.626 m)   Wt 166 lb 3.2 oz (75.4 kg)   SpO2 98%   BMI 28.53 kg/m   Physical Examination:   General Appearance: No distress  EYES PERRLA, EOM intact.   NECK Supple, No JVD Pulmonary: normal breath sounds, No wheezing.  CardiovascularNormal S1,S2.  No m/r/g.   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 c/w ASTHMA/COPD  ASTHMA/COPD Patient states that [employer] may not be helping we will switch to [employer] inhaler Patient is to avoid triggers at all costs Avoid sick contacts  CT of the chest abnormal radiological finding right upper lobe fibrosis Follow-up CT scans yearly There is no progression of disease over the last several years CT of the chest pending with lung cancer screening program  OSA Patient uses and benefits from CPAP therapy Excellent compliance    Sjogren's syndrome follow-up with [facility] rheumatology  Cardiac disease and CHF Follow-up with cardiology as scheduled   MEDICATION ADJUSTMENTS/LABS AND TESTS ORDERED: Continue albuterol as prescribed Avoid secondhand smoke exposure   continue CPAP as prescribed  Start Breztri  2 puffs in a.m. and 2 puffs in the p.m. Rinse mouth after every use  Please follow-up lung cancer screening CT scan    Patient satisfied with Plan of action and management. All questions answered  Follow up in 1 year  Total time spent 24 minutes   Maretta Bees Patricia Pesa, M.D.  [facility]  Medical Director [place] Director [facility]

## 2022-03-06 NOTE — Patient Instructions (Signed)
Great job!!   continue CPAP as prescribed  Start Breztri  2 puffs in a.m. and 2 puffs in the p.m. Rinse mouth after every use  Please follow-up lung cancer screening CT scan

## 2022-03-08 DIAGNOSIS — R0602 Shortness of breath: Secondary | ICD-10-CM | POA: Diagnosis not present

## 2022-03-08 DIAGNOSIS — I219 Acute myocardial infarction, unspecified: Secondary | ICD-10-CM | POA: Diagnosis not present

## 2022-03-08 DIAGNOSIS — Z789 Other specified health status: Secondary | ICD-10-CM | POA: Diagnosis not present

## 2022-03-08 DIAGNOSIS — E785 Hyperlipidemia, unspecified: Secondary | ICD-10-CM | POA: Diagnosis not present

## 2022-03-08 DIAGNOSIS — I10 Essential (primary) hypertension: Secondary | ICD-10-CM | POA: Diagnosis not present

## 2022-03-08 DIAGNOSIS — R079 Chest pain, unspecified: Secondary | ICD-10-CM | POA: Diagnosis not present

## 2022-03-08 DIAGNOSIS — E1169 Type 2 diabetes mellitus with other specified complication: Secondary | ICD-10-CM | POA: Diagnosis not present

## 2022-03-08 DIAGNOSIS — G4733 Obstructive sleep apnea (adult) (pediatric): Secondary | ICD-10-CM | POA: Diagnosis not present

## 2022-03-08 DIAGNOSIS — R002 Palpitations: Secondary | ICD-10-CM | POA: Diagnosis not present

## 2022-03-11 ENCOUNTER — Encounter: Payer: Self-pay | Admitting: Family Medicine

## 2022-03-13 DIAGNOSIS — Z170 Estrogen receptor positive status [ER+]: Secondary | ICD-10-CM | POA: Diagnosis not present

## 2022-03-13 DIAGNOSIS — R937 Abnormal findings on diagnostic imaging of other parts of musculoskeletal system: Secondary | ICD-10-CM | POA: Diagnosis not present

## 2022-03-13 DIAGNOSIS — G6289 Other specified polyneuropathies: Secondary | ICD-10-CM | POA: Diagnosis not present

## 2022-03-13 DIAGNOSIS — R7989 Other specified abnormal findings of blood chemistry: Secondary | ICD-10-CM | POA: Diagnosis not present

## 2022-03-13 DIAGNOSIS — C50819 Malignant neoplasm of overlapping sites of unspecified female breast: Secondary | ICD-10-CM | POA: Diagnosis not present

## 2022-03-14 DIAGNOSIS — R937 Abnormal findings on diagnostic imaging of other parts of musculoskeletal system: Secondary | ICD-10-CM | POA: Diagnosis not present

## 2022-03-14 DIAGNOSIS — Z170 Estrogen receptor positive status [ER+]: Secondary | ICD-10-CM | POA: Diagnosis not present

## 2022-03-14 DIAGNOSIS — C50819 Malignant neoplasm of overlapping sites of unspecified female breast: Secondary | ICD-10-CM | POA: Diagnosis not present

## 2022-03-19 ENCOUNTER — Ambulatory Visit
Admission: RE | Admit: 2022-03-19 | Discharge: 2022-03-19 | Disposition: A | Discharge status: Home / Self Care | Payer: PPO | Source: Ambulatory Visit | Attending: Acute Care | Admitting: Acute Care

## 2022-03-19 ENCOUNTER — Encounter: Payer: Self-pay | Admitting: Internal Medicine

## 2022-03-19 DIAGNOSIS — Z87891 Personal history of nicotine dependence: Secondary | ICD-10-CM | POA: Insufficient documentation

## 2022-03-19 NOTE — Telephone Encounter (Signed)
Dr. Kasa, please advise. Thanks °

## 2022-03-21 ENCOUNTER — Telehealth: Payer: Self-pay | Admitting: Internal Medicine

## 2022-03-21 ENCOUNTER — Other Ambulatory Visit: Payer: Self-pay | Admitting: Acute Care

## 2022-03-21 DIAGNOSIS — Z87891 Personal history of nicotine dependence: Secondary | ICD-10-CM

## 2022-03-21 DIAGNOSIS — Z122 Encounter for screening for malignant neoplasm of respiratory organs: Secondary | ICD-10-CM

## 2022-03-21 NOTE — Telephone Encounter (Signed)
Routing to nodule pool.

## 2022-03-21 NOTE — Telephone Encounter (Signed)
Spoke with pt regarding lung screening CT results. Pt's cardiologist had questions about the size of the pericardial effusion that was note on the scan because pt had Echo on 03/08/2022 and this was not noted on the echo. Pt reports that she has been having some SOB and abdominal swelling and they want to make sure that this isnt the cause of that. I explained to pt that I would call [facility] Radiology to get an over read on the CT scan to note more information on the pericardial effusion and that I would call her back once I have more info. Spoke with [facility] and asked for an addendum to this CT report. Will await results.

## 2022-03-25 ENCOUNTER — Ambulatory Visit (INDEPENDENT_AMBULATORY_CARE_PROVIDER_SITE_OTHER): Payer: PPO | Admitting: Family Medicine

## 2022-03-25 ENCOUNTER — Encounter: Payer: Self-pay | Admitting: Family Medicine

## 2022-03-25 DIAGNOSIS — E109 Type 1 diabetes mellitus without complications: Secondary | ICD-10-CM | POA: Diagnosis not present

## 2022-03-25 DIAGNOSIS — G894 Chronic pain syndrome: Secondary | ICD-10-CM

## 2022-03-25 DIAGNOSIS — C50919 Malignant neoplasm of unspecified site of unspecified female breast: Secondary | ICD-10-CM | POA: Diagnosis not present

## 2022-03-25 DIAGNOSIS — K743 Primary biliary cirrhosis: Secondary | ICD-10-CM

## 2022-03-25 MED ORDER — TRAMADOL HCL 50 MG PO TABS: 50.0000 mg | ORAL_TABLET | Freq: Four times a day (QID) | ORAL | 0 refills | Status: DC | PRN

## 2022-03-25 MED ORDER — PROMETHAZINE HCL 12.5 MG PO TABS: 12.5000 mg | ORAL_TABLET | Freq: Three times a day (TID) | ORAL | 0 refills | Status: DC | PRN

## 2022-03-25 NOTE — Progress Notes (Signed)
Virtual Visit via telephone Note  This visit type was conducted due to national recommendations for restrictions regarding the COVID-19 pandemic (e.g. social distancing).  This format is felt to be most appropriate for this patient at this time.  All issues noted in this document were discussed and addressed.  No physical exam was performed (except for noted visual exam findings with Video Visits).   I connected with Kristina Mccoy today at  2:45 PM EDT by telephone and verified that I am speaking with the correct person using two identifiers. Location patient: home Location provider: work  Persons participating in the virtual visit: patient, provider  I discussed the limitations, risks, security and privacy concerns of performing an evaluation and management service by telephone and the availability of in person appointments. I also discussed with the patient that there may be a patient responsible charge related to this service. The patient expressed understanding and agreed to proceed.  Interactive audio and video telecommunications were attempted between this provider and patient, however failed, due to patient having technical difficulties OR patient did not have access to video capability.  We continued and completed visit with audio only.   Reason for visit: f/u  HPI: DIABETES Disease Monitoring: Blood Sugar ranges-193 90 day average  Optho- UTD Medications: Compliance- taking basaglar 10 u daily, novolog sliding scale and 4u prior to meal Hypoglycemic symptoms- 1 in the past 90 days, felt weak and sweaty, 69 with arrow going down, drank juice and chocolate and it went up   Nausea/primary biliary cirrhosis: Patient notes her insurance no longer covers Zofran.  She has nausea 4 times per week.  She notes no new medications that would be causing this.  She does see a liver specialist at [facility] in July.  She notes her constipation has improved since she has been on Linzess and taking  Metamucil.  Chronic pain: This seems to be arthritic related based on her bone scans revealing degenerative changes in numerous joints.  Her rheumatologist tried Celebrex though she had a lot of GI issues.  They also tried glucosamine and she had GI issues with that.  She has a history of an MI and thus and says her not a great option.  She does take tramadol with decent benefit.  History of breast cancer: Patient saw a breast cancer specialist at [facility].  They did extensive lab work and repeated a bone scan.  They also had an MRI of her brain.  There was no evidence of metastatic disease.  They did note a lesion in the left occipital condyle on the MRI brain though they did not comment on what this might be in the impression.  Her CEA was minimally elevated and she notes the nurse that called her from the oncologist office advised I would recheck it in 1 month.  ROS: See pertinent positives and negatives per HPI.  Past Medical History:  Diagnosis Date   Abdominal pain, epigastric    Abnormal chest CT 09/08/2014   Allergic urticaria    Blood clotting disorder (HCC)    Breast cancer (HCC) 01/2013   left breast   Breast cancer (HCC) 02/2013   Dr Christell Constant ([place])   Diabetes mellitus    Diverticulosis    Fibromyalgia    GERD (gastroesophageal reflux disease)    Heart murmur    Hyperlipidemia    Lymphedema of arm    right   MI (myocardial infarction) [facility])    cardiac MRI 3/2022There is a small  subendocardial infarction (< 25% transmural) involving the basal-to-mid inferolateral wall, which is consistent with the distribution of a posterolateral branch of the RCA or LCx (depending on the pattern of coronary dominance).   Myalgia    Myositis    Neuromyositis    Otitis media, chronic    Pain syndrome, chronic    Peptic ulcer    Sicca syndrome (HCC)    Sjogren's disease (HCC)    Sleep apnea    SOB (shortness of breath)    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HCC)     Past  Surgical History:  Procedure Laterality Date   BREAST SURGERY Bilateral    mastectomy   COLONOSCOPY WITH PROPOFOL N/A 04/22/2017   Procedure: COLONOSCOPY WITH PROPOFOL;  Surgeon: Christena Deem, MD;  Location: [facility] ENDOSCOPY;  Service: Endoscopy;  Laterality: N/A;   ESOPHAGOGASTRODUODENOSCOPY (EGD) WITH PROPOFOL N/A 04/22/2017   Procedure: ESOPHAGOGASTRODUODENOSCOPY (EGD) WITH PROPOFOL;  Surgeon: Christena Deem, MD;  Location: [facility] ENDOSCOPY;  Service: Endoscopy;  Laterality: N/A;   MASTECTOMY Bilateral 03/23/13    Family History  Problem Relation Age of Onset   Diabetes Mother    Heart disease Mother    Hyperlipidemia Mother    Heart failure Mother    Diabetes Other    Pancreatic cancer Father    Heart disease Brother        Aortic valve disease   Hyperlipidemia Brother    Colon polyps Brother    Stroke Maternal Grandmother    Breast cancer Maternal Aunt    Colon cancer Paternal Grandmother    Rheum arthritis Sister    Psoriasis Other        psoriatic arthritis    Heart disease Brother 51       Tachycardia   Heart disease Brother    Ovarian cancer Neg Hx     SOCIAL HX: Former smoker   Current Outpatient Medications:    albuterol (VENTOLIN HFA) 108 (90 Base) MCG/ACT inhaler, Inhale 2 puffs into the lungs every 4 (four) hours as needed for wheezing or shortness of breath., Disp: 18 g, Rfl: 0   ALPRAZolam (XANAX) 0.5 MG tablet, TAKE 1 TABLET BY MOUTH 4 TIMES A DAY AS NEEDED FOR ANXIETY, Disp: 120 tablet, Rfl: 0   aspirin EC 81 MG tablet, Take 1 tablet (81 mg total) by mouth daily., Disp: 90 tablet, Rfl: 3   Azelastine HCl 137 MCG/SPRAY SOLN, PLACE 2 SPRAYS INTO BOTH NOSTRILS 2 (TWO) TIMES DAILY. USE IN EACH NOSTRIL AS DIRECTED, Disp: 30 mL, Rfl: 2   Biotin 10 MG TABS, Take by mouth., Disp: , Rfl:    Budeson-Glycopyrrol-Formoterol (BREZTRI AEROSPHERE) 160-9-4.8 MCG/ACT AERO, Inhale 2 puffs into the lungs in the morning and at bedtime., Disp: 1 each, Rfl: 4    Cholecalciferol (VITAMIN D3) 1000 units CAPS, Take by mouth daily. , Disp: , Rfl:    Continuous Blood Gluc Sensor (FREESTYLE LIBRE 14 DAY SENSOR) MISC, SMARTSIG:1 Each Topical Every 2 Weeks, Disp: , Rfl:    Cyanocobalamin 1000 MCG CAPS, Take by mouth. Takes occassionally, Disp: , Rfl:    Emollient (CERAVE) CREA, Apply topically daily. , Disp: , Rfl:    ezetimibe (ZETIA) 10 MG tablet, Take 10 mg by mouth daily., Disp: , Rfl:    Glucagon 3 MG/DOSE POWD, 1 spray into the left nostril every fifteen (15) minutes as needed (low blood sugar requiring assistance)., Disp: , Rfl:    hydrocortisone 2.5 % cream, APPLY TO AFFECTED AREA TWICE A DAY, Disp: ,  Rfl:    ibuprofen (ADVIL) 200 MG tablet, Take 200 mg by mouth every 6 (six) hours as needed., Disp: , Rfl:    insulin aspart (NOVOLOG) 100 UNIT/ML FlexPen, Inject 12 Units into the skin 3 (three) times daily with meals., Disp: 15 mL, Rfl: 3   Insulin Glargine (BASAGLAR KWIKPEN) 100 UNIT/ML, Inject up to 20 units once daily as directed by endocrinology, Disp: 15 mL, Rfl: 0   Insulin Pen Needle 32G X 6 MM MISC, Use 3 times daily with meals., Disp: 1 each, Rfl: 3   ketoconazole (NIZORAL) 2 % cream, Apply 1 application topically 2 (two) times daily as needed., Disp: , Rfl: 3   ketoconazole (NIZORAL) 2 % shampoo, Apply 1 application topically as needed., Disp: , Rfl: 11   metoprolol succinate (TOPROL-XL) 25 MG 24 hr tablet, Take 1 tablet (25 mg total) by mouth daily., Disp: 90 tablet, Rfl: 3   ondansetron (ZOFRAN) 4 MG tablet, TAKE 1 TABLET BY MOUTH EVERY 8 HOURS AS NEEDED FOR NAUSEA AND VOMITING, Disp: 18 tablet, Rfl: 1   promethazine (PHENERGAN) 12.5 MG tablet, Take 1 tablet (12.5 mg total) by mouth every 8 (eight) hours as needed for nausea or vomiting., Disp: 20 tablet, Rfl: 0   pseudoephedrine-guaifenesin (MUCINEX D) 60-600 MG 12 hr tablet, Take 1 tablet by mouth 2 (two) times daily as needed. , Disp: , Rfl:    rOPINIRole (REQUIP) 0.25 MG tablet, Take 0.25 mg  by mouth daily as needed., Disp: , Rfl:    traZODone (DESYREL) 50 MG tablet, TAKE 0.5-1 TABLETS BY MOUTH AT BEDTIME AS NEEDED FOR SLEEP., Disp: 90 tablet, Rfl: 2   triamcinolone (NASACORT) 55 MCG/ACT AERO nasal inhaler, Place 2 sprays into the nose daily., Disp: , Rfl:    ursodiol (ACTIGALL) 500 MG tablet, Take 1,000 mg by mouth daily., Disp: , Rfl:    famotidine (PEPCID) 20 MG tablet, Take 1 tablet (20 mg total) by mouth at bedtime for 14 days. (Patient taking differently: Take 20 mg by mouth daily. LRN), Disp: 14 tablet, Rfl: 0   traMADol (ULTRAM) 50 MG tablet, Take 1 tablet (50 mg total) by mouth every 6 (six) hours as needed. for pain, Disp: 90 tablet, Rfl: 0  EXAM: This was a telephone visit thus no exam was completed.   ASSESSMENT AND PLAN:  Discussed the following assessment and plan:  Problem List Items Addressed This Visit     Chronic pain disorder (Chronic)    Likely a mix of osteoarthritis and fibromyalgia.  Tramadol 50 mg every 6 hours as needed for pain refilled.  Controlled substance database reviewed.      Relevant Medications   traMADol (ULTRAM) 50 MG tablet   Malignant neoplasm of breast (female) (HCC) (Chronic)    She will continue to follow with oncology.  Advised I would contact [facility] radiology to see what they thought the left occipital condyle lesion might be.      Type 1 diabetes (HCC) (Chronic)    Discussed that she needs to start increasing her Basaglar.  She has not increased this since her last visit.  She will go up by 1 unit/week until her glucose is in the range of 80-130.  She will continue her sliding scale NovoLog and her 4 units of NovoLog prior to meals.  She will keep her scheduled visit with endocrinology.  I advised that if she develops more frequent low sugars as she increases the Basaglar she needs to let us know.  Primary biliary cirrhosis (HCC)    She will keep her appointment with the GI specialist at [facility].  I wonder if this may be  contributing to her nausea.  Phenergan prescribed to take infrequently for nausea.       Return in about 3 months (around 06/25/2022).   I discussed the assessment and treatment plan with the patient. The patient was provided an opportunity to ask questions and all were answered. The patient agreed with the plan and demonstrated an understanding of the instructions.   The patient was advised to call back or seek an in-person evaluation if the symptoms worsen or if the condition fails to improve as anticipated.  I provided 25 minutes of non-face-to-face time during this encounter.   [name], MD

## 2022-03-25 NOTE — Assessment & Plan Note (Signed)
Discussed that she needs to start increasing her Basaglar.  She has not increased this since her last visit.  She will go up by 1 unit/week until her glucose is in the range of 80-130.  She will continue her sliding scale NovoLog and her 4 units of NovoLog prior to meals.  She will keep her scheduled visit with endocrinology.  I advised that if she develops more frequent low sugars as she increases the Basaglar she needs to let us know.

## 2022-04-05 ENCOUNTER — Other Ambulatory Visit: Payer: Self-pay | Admitting: Family Medicine

## 2022-04-05 DIAGNOSIS — K743 Primary biliary cirrhosis: Secondary | ICD-10-CM | POA: Diagnosis not present

## 2022-04-05 DIAGNOSIS — F32A Depression, unspecified: Secondary | ICD-10-CM

## 2022-04-05 DIAGNOSIS — M159 Polyosteoarthritis, unspecified: Secondary | ICD-10-CM | POA: Diagnosis not present

## 2022-04-05 DIAGNOSIS — M3508 Sjogren syndrome with gastrointestinal involvement: Secondary | ICD-10-CM | POA: Diagnosis not present

## 2022-04-09 ENCOUNTER — Telehealth: Payer: Self-pay

## 2022-04-09 NOTE — Telephone Encounter (Signed)
I called the patient and informed her that her Dellie Burns has arrived and is available for pickup and she understood. Halee Glynn,cma

## 2022-04-17 DIAGNOSIS — D1039 Benign neoplasm of other parts of mouth: Secondary | ICD-10-CM | POA: Diagnosis not present

## 2022-04-22 ENCOUNTER — Telehealth: Payer: Self-pay | Admitting: Family Medicine

## 2022-04-22 NOTE — Telephone Encounter (Signed)
I attempted to call [facility]ology to speak with a radiologist regarding this patients MRI from 03/14/22 to get clarification on the lesion seen in her occipital condyle. I was transferred several times and placed on hold then advised I would be transferred to the radiologist and the phone just rang and then went back to being on hold for another couple of minutes. At that point I disconnected the call. I will attempt to call back at a later time.

## 2022-05-01 DIAGNOSIS — Z9989 Dependence on other enabling machines and devices: Secondary | ICD-10-CM | POA: Diagnosis not present

## 2022-05-01 DIAGNOSIS — K121 Other forms of stomatitis: Secondary | ICD-10-CM | POA: Diagnosis not present

## 2022-05-01 DIAGNOSIS — M3506 Sjogren syndrome with peripheral nervous system involvement: Secondary | ICD-10-CM | POA: Diagnosis not present

## 2022-05-01 DIAGNOSIS — C50819 Malignant neoplasm of overlapping sites of unspecified female breast: Secondary | ICD-10-CM | POA: Diagnosis not present

## 2022-05-01 DIAGNOSIS — R2689 Other abnormalities of gait and mobility: Secondary | ICD-10-CM | POA: Diagnosis not present

## 2022-05-01 DIAGNOSIS — G939 Disorder of brain, unspecified: Secondary | ICD-10-CM | POA: Diagnosis not present

## 2022-05-01 DIAGNOSIS — R252 Cramp and spasm: Secondary | ICD-10-CM | POA: Diagnosis not present

## 2022-05-01 DIAGNOSIS — G4733 Obstructive sleep apnea (adult) (pediatric): Secondary | ICD-10-CM | POA: Diagnosis not present

## 2022-05-01 DIAGNOSIS — G63 Polyneuropathy in diseases classified elsewhere: Secondary | ICD-10-CM | POA: Diagnosis not present

## 2022-05-01 DIAGNOSIS — R519 Headache, unspecified: Secondary | ICD-10-CM | POA: Diagnosis not present

## 2022-05-01 DIAGNOSIS — R4189 Other symptoms and signs involving cognitive functions and awareness: Secondary | ICD-10-CM | POA: Diagnosis not present

## 2022-05-01 DIAGNOSIS — Z170 Estrogen receptor positive status [ER+]: Secondary | ICD-10-CM | POA: Diagnosis not present

## 2022-05-06 ENCOUNTER — Other Ambulatory Visit: Payer: Self-pay | Admitting: Family Medicine

## 2022-05-06 DIAGNOSIS — E109 Type 1 diabetes mellitus without complications: Secondary | ICD-10-CM | POA: Diagnosis not present

## 2022-05-06 DIAGNOSIS — F419 Anxiety disorder, unspecified: Secondary | ICD-10-CM

## 2022-05-06 DIAGNOSIS — M3500 Sicca syndrome, unspecified: Secondary | ICD-10-CM | POA: Diagnosis not present

## 2022-05-06 LAB — HEMOGLOBIN A1C: Hemoglobin A1C: 8.7

## 2022-05-07 NOTE — Telephone Encounter (Signed)
LVM for patient to call back.   Miquel Lamson,cma  

## 2022-05-07 NOTE — Telephone Encounter (Signed)
I was finally able to get radiology at [facility] on the phone. They noted that the lesion at the occipital condyle is likely a fatty lesion or hemangioma and is not anything suspicious. Please let the patient know this and that there is no further evaluation needed for this lesion.

## 2022-05-08 DIAGNOSIS — D485 Neoplasm of uncertain behavior of skin: Secondary | ICD-10-CM | POA: Diagnosis not present

## 2022-05-08 DIAGNOSIS — L578 Other skin changes due to chronic exposure to nonionizing radiation: Secondary | ICD-10-CM | POA: Diagnosis not present

## 2022-05-08 DIAGNOSIS — L821 Other seborrheic keratosis: Secondary | ICD-10-CM | POA: Diagnosis not present

## 2022-05-08 NOTE — Telephone Encounter (Signed)
Patient returned my call and I informed her that the provider stated he called radiology about a lesion that was in her Occipital Condyle and that it was a fatty lesion and there was no concern and no further evaluation needed and she understood and stated thank you.  Marietta Sikkema,cma

## 2022-05-14 ENCOUNTER — Encounter: Admission: RE | Payer: Self-pay | Source: Home / Self Care

## 2022-05-14 ENCOUNTER — Ambulatory Visit: Admission: RE | Admit: 2022-05-14 | Payer: PPO | Source: Home / Self Care

## 2022-05-14 SURGERY — COLONOSCOPY WITH PROPOFOL
Anesthesia: General

## 2022-05-15 ENCOUNTER — Telehealth: Payer: Self-pay

## 2022-05-15 NOTE — Telephone Encounter (Signed)
I called the patient and informed the patient with a  VM that her Novolog has arrived and is available for pickup. Ellese Julius,cma

## 2022-05-24 ENCOUNTER — Telehealth: Payer: Self-pay

## 2022-05-24 NOTE — Telephone Encounter (Signed)
Lvm to inform pt that we received her Berger from [place] & is ready for p/u M-F 8-5.  Medicine placed in pt assistance fridge.

## 2022-05-29 DIAGNOSIS — Z1211 Encounter for screening for malignant neoplasm of colon: Secondary | ICD-10-CM | POA: Diagnosis not present

## 2022-05-31 NOTE — Telephone Encounter (Signed)
Pt picked up.

## 2022-06-05 LAB — COLOGUARD: COLOGUARD: NEGATIVE

## 2022-06-07 ENCOUNTER — Encounter: Payer: Self-pay | Admitting: Family Medicine

## 2022-06-07 ENCOUNTER — Telehealth: Payer: Self-pay | Admitting: Family Medicine

## 2022-06-07 NOTE — Telephone Encounter (Signed)
Pt need refill on [facility]olam [facility] 

## 2022-06-08 ENCOUNTER — Other Ambulatory Visit: Payer: Self-pay | Admitting: Family

## 2022-06-08 DIAGNOSIS — F32A Depression, unspecified: Secondary | ICD-10-CM

## 2022-06-08 MED ORDER — ALPRAZOLAM 0.5 MG PO TABS: ORAL_TABLET | ORAL | 0 refills | Status: DC

## 2022-06-12 ENCOUNTER — Other Ambulatory Visit: Payer: Self-pay | Admitting: Family

## 2022-06-12 DIAGNOSIS — F419 Anxiety disorder, unspecified: Secondary | ICD-10-CM

## 2022-06-12 MED ORDER — ALPRAZOLAM 0.5 MG PO TABS: ORAL_TABLET | ORAL | 0 refills | Status: DC

## 2022-07-02 ENCOUNTER — Telehealth: Payer: Self-pay | Admitting: Pharmacist

## 2022-07-02 NOTE — Progress Notes (Signed)
[facility])                                            [place] Team    07/02/2022  [place] Huberty Jan 09, 1956 [id number]   Placed telephonic outreach to Ms. Kristina Mccoy regarding her interest in re-enrolling with patient assistance for 2024 for Basaglar and Novolog. Left a HIPAA compliant voice mail for patient to return my call.   [name], PharmD [place] Pharmacist Office: [phone]

## 2022-07-03 ENCOUNTER — Ambulatory Visit (INDEPENDENT_AMBULATORY_CARE_PROVIDER_SITE_OTHER): Payer: PPO | Admitting: Family Medicine

## 2022-07-03 ENCOUNTER — Encounter: Payer: Self-pay | Admitting: Internal Medicine

## 2022-07-03 ENCOUNTER — Encounter: Payer: Self-pay | Admitting: Family Medicine

## 2022-07-03 DIAGNOSIS — J45909 Unspecified asthma, uncomplicated: Secondary | ICD-10-CM | POA: Diagnosis not present

## 2022-07-03 DIAGNOSIS — D689 Coagulation defect, unspecified: Secondary | ICD-10-CM | POA: Diagnosis not present

## 2022-07-03 DIAGNOSIS — K1370 Unspecified lesions of oral mucosa: Secondary | ICD-10-CM

## 2022-07-03 DIAGNOSIS — M1991 Primary osteoarthritis, unspecified site: Secondary | ICD-10-CM

## 2022-07-03 DIAGNOSIS — M1990 Unspecified osteoarthritis, unspecified site: Secondary | ICD-10-CM | POA: Insufficient documentation

## 2022-07-03 DIAGNOSIS — R2990 Unspecified symptoms and signs involving the nervous system: Secondary | ICD-10-CM | POA: Diagnosis not present

## 2022-07-03 DIAGNOSIS — K743 Primary biliary cirrhosis: Secondary | ICD-10-CM | POA: Diagnosis not present

## 2022-07-03 DIAGNOSIS — K219 Gastro-esophageal reflux disease without esophagitis: Secondary | ICD-10-CM | POA: Diagnosis not present

## 2022-07-03 DIAGNOSIS — E785 Hyperlipidemia, unspecified: Secondary | ICD-10-CM | POA: Diagnosis not present

## 2022-07-03 MED ORDER — PANTOPRAZOLE SODIUM 40 MG PO TBEC: 40.0000 mg | DELAYED_RELEASE_TABLET | Freq: Every day | ORAL | 3 refills | Status: DC

## 2022-07-03 NOTE — Patient Instructions (Signed)
Nice to see you. Please start the Protonix 30 to 60 minutes before breakfast each day.  GI should contact you to set up evaluation for your reflux. Somebody should contact you for patient assistance.

## 2022-07-03 NOTE — Telephone Encounter (Signed)
Dr. Kasa, please advise. Thanks °

## 2022-07-03 NOTE — Assessment & Plan Note (Signed)
Patient's speech and eye opening issues could have been related to stroke.  Discussed risk factor management including control of diabetes, cholesterol, and blood pressure.  Discussed taking aspirin 81 mg daily.

## 2022-07-03 NOTE — Assessment & Plan Note (Signed)
Patient has had persistent reflux issues on Pepcid.  I will start her on Protonix 40 mg once daily 30 to 60 minutes before breakfast.  I will refer her back to her local GI to consider EGD given her dysphagia.

## 2022-07-03 NOTE — Progress Notes (Signed)
[name], MD Phone: [phone]  Kristina Mccoy is a 66 y.o. female who presents today for f/u.  Primary biliary cirrhosis: She had to reschedule her GI appointment.  She sees GI at [facility] on 10/16.  Reactive airway disease: She does not use [place] on a daily basis as it makes her heart race.  She notes some cough and shortness of breath intermittently with some sneezing and some headaches over the last several weeks.  Feels that this is allergy related.  No wheezing.  She did feel fluttering in her left posterior lower lung that has resolved and not recurred.  She notes no fevers though she did have chills.  That was 2 weeks ago.  Osteoarthritis: Patient has chronic joint pain though notes it is more pronounced in her hips and back.  She saw her rheumatologist and they felt like her symptoms were related to osteoarthritis.  They advised that she take Tylenol and Voltaren gel.  They tried Celebrex and that made her reflux worse.    GERD: Patient takes Pepcid.  She does still have some reflux.  She does note dysphagia with food getting stuck on a daily basis.  She has to drink something after eating to get it to go all the way down.  Neurological issues: Patient notes for a while she had issues opening both eyes in the morning.  She also had some speech issues where she would get stuck midsentence.  Notes that went on for several months.  She notes intermittent issues with numbness and weakness in her bilateral arms and legs.  She saw neurology and they noted that she was having silent strokes based on her MRI.  They told her to take a baby aspirin and read out loud to help with her symptoms.  It appears she has also requested an appointment with neurology through [facility] for a second opinion.  Oral lesion: Patient notes she had a lesion removed from the roof of her mouth at [employer].  She reports they advised her it was a cluster of blood clots and they wondered if she  had a clotting disorder.  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  Hematology noted this did not meet criteria for a diagnosis of antiphospholipid disorder.  They did not feel she was at risk for any antiphospholipid antibody related disorders beyond the risk conferred by her medical history.  Social History   Tobacco Use  Smoking Status Former   Packs/day: 1.00   Years: 30.00   Total pack years: 30.00   Types: Cigarettes   Quit date: 02/28/2013   Years since quitting: 9.3  Smokeless Tobacco Never    Current Outpatient Medications on File Prior to Visit  Medication Sig Dispense Refill   albuterol (VENTOLIN HFA) 108 (90 Base) MCG/ACT inhaler Inhale 2 puffs into the lungs every 4 (four) hours as needed for wheezing or shortness of breath. 18 g 0   ALPRAZolam (XANAX) 0.5 MG tablet TAKE 1 TABLET BY MOUTH 4 TIMES A DAY AS NEEDED FOR ANXIETY 120 tablet 0   aspirin EC 81 MG tablet Take 1 tablet (81 mg total) by mouth daily. 90 tablet 3   Azelastine HCl 137 MCG/SPRAY SOLN PLACE 2 SPRAYS INTO BOTH NOSTRILS 2 (TWO) TIMES DAILY. USE IN EACH NOSTRIL AS DIRECTED 30 mL 2   Biotin 10 MG TABS Take by mouth.  Budeson-Glycopyrrol-Formoterol (BREZTRI AEROSPHERE) 160-9-4.8 MCG/ACT AERO Inhale 2 puffs into the lungs in the morning and at bedtime. 1 each 4   Cholecalciferol (VITAMIN D3) 1000 units CAPS Take by mouth daily.      Continuous Blood Gluc Sensor (FREESTYLE LIBRE 14 DAY SENSOR) MISC SMARTSIG:1 Each Topical Every 2 Weeks     Cyanocobalamin 1000 MCG CAPS Take by mouth. Takes occassionally     Emollient (CERAVE) CREA Apply topically daily.      ezetimibe (ZETIA) 10 MG tablet Take 10 mg by mouth daily.     Glucagon 3 MG/DOSE POWD 1 spray into the left nostril every fifteen (15) minutes as needed (low blood sugar requiring assistance).     hydrocortisone 2.5 % cream APPLY TO AFFECTED AREA TWICE A  DAY     ibuprofen (ADVIL) 200 MG tablet Take 200 mg by mouth every 6 (six) hours as needed.     insulin aspart (NOVOLOG) 100 UNIT/ML FlexPen Inject 12 Units into the skin 3 (three) times daily with meals. 15 mL 3   Insulin Glargine (BASAGLAR KWIKPEN) 100 UNIT/ML Inject up to 20 units once daily as directed by endocrinology 15 mL 0   Insulin Pen Needle 32G X 6 MM MISC Use 3 times daily with meals. 1 each 3   ketoconazole (NIZORAL) 2 % cream Apply 1 application topically 2 (two) times daily as needed.  3   ketoconazole (NIZORAL) 2 % shampoo Apply 1 application topically as needed.  11   metoprolol succinate (TOPROL-XL) 25 MG 24 hr tablet Take 1 tablet (25 mg total) by mouth daily. 90 tablet 3   ondansetron (ZOFRAN) 4 MG tablet TAKE 1 TABLET BY MOUTH EVERY 8 HOURS AS NEEDED FOR NAUSEA AND VOMITING 18 tablet 1   promethazine (PHENERGAN) 12.5 MG tablet Take 1 tablet (12.5 mg total) by mouth every 8 (eight) hours as needed for nausea or vomiting. 20 tablet 0   pseudoephedrine-guaifenesin (MUCINEX D) 60-600 MG 12 hr tablet Take 1 tablet by mouth 2 (two) times daily as needed.      rOPINIRole (REQUIP) 0.25 MG tablet Take 0.25 mg by mouth daily as needed.     traMADol (ULTRAM) 50 MG tablet Take 1 tablet (50 mg total) by mouth every 6 (six) hours as needed. for pain 90 tablet 0   traZODone (DESYREL) 50 MG tablet TAKE 0.5-1 TABLETS BY MOUTH AT BEDTIME AS NEEDED FOR SLEEP. 90 tablet 2   triamcinolone (NASACORT) 55 MCG/ACT AERO nasal inhaler Place 2 sprays into the nose daily.     ursodiol (ACTIGALL) 500 MG tablet Take 1,000 mg by mouth daily.     famotidine (PEPCID) 20 MG tablet Take 1 tablet (20 mg total) by mouth at bedtime for 14 days. (Patient taking differently: Take 20 mg by mouth daily. LRN) 14 tablet 0   No current facility-administered medications on file prior to visit.     ROS see history of present illness  Objective  Physical Exam Vitals:   07/03/22 0856  BP: 118/60  Pulse: 66  SpO2:  98%    BP Readings from Last 3 Encounters:  07/03/22 118/60  03/06/22 126/76  12/21/21 110/70   Wt Readings from Last 3 Encounters:  07/03/22 170 lb 6.4 oz (77.3 kg)  03/25/22 166 lb (75.3 kg)  03/19/22 164 lb (74.4 kg)    Physical Exam Constitutional:      General: She is not in acute distress.    Appearance: She is not diaphoretic.  Cardiovascular:  Rate and Rhythm: Normal rate and regular rhythm.     Heart sounds: Normal heart sounds.  Pulmonary:     Effort: Pulmonary effort is normal.     Breath sounds: Normal breath sounds.  Skin:    General: Skin is warm and dry.  Neurological:     Mental Status: She is alert.      Assessment/Plan: Please see individual problem list.  Problem List Items Addressed This Visit     GERD (gastroesophageal reflux disease) (Chronic)    Patient has had persistent reflux issues on Pepcid.  I will start her on Protonix 40 mg once daily 30 to 60 minutes before breakfast.  I will refer her back to her local GI to consider EGD given her dysphagia.      Relevant Medications   pantoprazole (PROTONIX) 40 MG tablet   Other Relevant Orders   Ambulatory referral to Gastroenterology   Hyperlipidemia (Chronic)    Patient has been intolerant of statins.  She has an allergy to Praluent.  She is currently on Zetia.  Her last LDL was not at goal given her microvascular changes on MRI.  We will see what the process is to get [facility] for the patient.  I did discuss that this would be in her system for a number of months so she had a side effect it would be present for a while.  She is interested in looking into getting this medication help reduce her risk of stroke and heart attack moving forward.      Osteoarthritis (Chronic)    Chronic joint pain related to osteoarthritis.  She will continue Tylenol and Voltaren gel as advised by her rheumatologist.      Primary biliary cirrhosis ([place]) (Chronic)    I encouraged her to keep her appointment with  GI.      Reactive airway disease (Chronic)    She has not been able to tolerate breztri consistently.  She has an allergy to methylprednisolone and has had issues with other inhaled steroids.  She will follow-up with her pulmonologist to determine her appropriate inhaler for use.      Blood clotting disorder [facility])    She saw hematology previously.  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  I will resolve this issue.      Neurological complaint    Patient's speech and eye opening issues could have been related to stroke.  Discussed risk factor management including control of diabetes, cholesterol, and blood pressure.  Discussed taking aspirin 81 mg daily.      Oral lesion    Biopsied by [facility] dental school.  Pathology results are listed below.  She will continue to follow with her dentist on this.  "Examination reveals sections of mildly inflamed oral mucosa with dilated capillaries and a small fragment of calcified debris.  There is no evidence of malignancy."       Return in about 3 months (around 10/03/2022).  I have spent 45 minutes in the care of this patient regarding history taking, documentation, completion of exam, chart review, discussion of plan, placing orders.   [name], MD [place]

## 2022-07-03 NOTE — Assessment & Plan Note (Addendum)
Biopsied by [facility] dental school.  Pathology results are listed below.  She will continue to follow with her dentist on this.  "Examination reveals sections of mildly inflamed oral mucosa with dilated capillaries and a small fragment of calcified debris.  There is no evidence of malignancy."

## 2022-07-03 NOTE — Assessment & Plan Note (Signed)
Patient has been intolerant of statins.  She has an allergy to Praluent.  She is currently on Zetia.  Her last LDL was not at goal given her microvascular changes on MRI.  We will see what the process is to get [facility] for the patient.  I did discuss that this would be in her system for a number of months so she had a side effect it would be present for a while.  She is interested in looking into getting this medication help reduce her risk of stroke and heart attack moving forward.

## 2022-07-03 NOTE — Assessment & Plan Note (Signed)
I encouraged her to keep her appointment with GI.

## 2022-07-03 NOTE — Assessment & Plan Note (Signed)
Chronic joint pain related to osteoarthritis.  She will continue Tylenol and Voltaren gel as advised by her rheumatologist.

## 2022-07-03 NOTE — Assessment & Plan Note (Addendum)
She has not been able to tolerate breztri consistently.  She has an allergy to methylprednisolone and has had issues with other inhaled steroids.  She will follow-up with her pulmonologist to determine her appropriate inhaler for use.

## 2022-07-03 NOTE — Assessment & Plan Note (Signed)
She saw hematology previously.  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  I will resolve this issue.

## 2022-07-04 NOTE — Progress Notes (Signed)
[facility])  [place] Team    07/04/2022  Kristina Mccoy 1956-08-20 [id number]  Outreach:  Successful telephone call with Ms. Kristina Mccoy.  HIPAA identifiers verified.   Objective: The ASCVD Risk score (Arnett DK, et al., 2019) failed to calculate for the following reasons:   The patient has a prior MI or stroke diagnosis  Lab Results  Component Value Date   CREATININE 0.80 11/07/2021   CREATININE 0.75 10/31/2020   CREATININE 0.66 07/03/2020    Lab Results  Component Value Date   HGBA1C 8.5 (H) 11/07/2021    Lipid Panel     Component Value Date/Time   CHOL 227 (H) 11/07/2021 1022   CHOL 113 09/03/2014 0352   TRIG 76.0 11/07/2021 1022   TRIG 107 09/03/2014 0352   HDL 82.30 11/07/2021 1022   HDL 16 (L) 09/03/2014 0352   CHOLHDL 3 11/07/2021 1022   VLDL 15.2 11/07/2021 1022   VLDL 21 09/03/2014 0352   LDLCALC 129 (H) 11/07/2021 1022   LDLCALC 200 (H) 04/28/2020 1446   LDLCALC 76 09/03/2014 0352   LDLDIRECT 153.0 06/19/2020 0941    BP Readings from Last 3 Encounters:  07/03/22 118/60  03/06/22 126/76  12/21/21 110/70    Allergies  Allergen Reactions   Gabapentin Itching and Swelling   Oxycodone Itching and Rash   Repatha [Evolocumab] Other (See Comments)    abdominal distention, headache, higher blood sugars, facial skin peeling   Carbamazepine Nausea And Vomiting and Other (See Comments)    Abdominal pain    Doxycycline Other (See Comments)   Dulaglutide     Other reaction(s): Other (See Comments) Severe stomach pain   Ezetimibe Other (See Comments)    myalgias   Insulin Glargine Other (See Comments)    BLOATING, MUSCLE/JOINT PAIN   Levaquin [Levofloxacin]     Diarrhea and pain in calf   Metformin     Other reaction(s): Other (See Comments) Cramping- abdominal pain   Methylprednisolone     Reddened face, puffy face    Morphine Other (See Comments)   Pregabalin Swelling    Swelling in arms and legs    Rosuvastatin Other (See Comments)    LFTs increased    Venlafaxine Other (See Comments)   Buprenorphine Rash and Swelling   Canagliflozin Other (See Comments) and Rash    YEAST INFECTIONS   Escitalopram Rash   Hydroxychloroquine Rash   Assessment: Discussed 2024 medication assistance renewal process with patient. [employer] banker, operational for [employer]) and Novolog [employer]) to her PO BOX.   Patient inquired about inhalers on the patient assistance program. Kristina Mccoy with [employer] is NOT an option for her at this time.  Dicussed income thresholds for [employer] (Spiriva, Stioloto, and Combivent). Per verbal report, patient is over the income threshold for the [facility] program. Discussed Glax[employer] [name] ( Ellipta inhalers) which she meets the income threshold for, however [employer] requires a $600 out of pocket spend on prescription drugs in 2023. Patient will send me her 2023 income documents and 2023 out of pocket expense report to see if she qualifies for [place] patient assistance.    Extra Help:  Not eligible for Extra Help Low Income Subsidy based on reported income and assets  Patient Assistance Programs: Engineer, agricultural made by [employer] requirement met: Yes Out-of-pocket prescription expenditure met:   Not Applicable Patient has met application requirements to apply for this program.    Novolog made  by [employer] requirement met: Yes Out-of-pocket prescription expenditure met:   Not Applicable Patient has met application requirements to apply for this program.   Plan: I will route patient assistance letter to [place] technician who will coordinate patient assistance program application process for medications listed above.  [facility] pharmacy technician will assist with obtaining all required documents from both patient and provider(s) and submit application(s) once completed.   [name], PharmD Commerce Pharmacist Office: [phone]

## 2022-07-04 NOTE — Progress Notes (Signed)
I sent a message to Sharee Pimple Simcox about the patients renewal of patient assistance and she stated they called her 3 times and LVM. She gave me the name of Tiffany and the phone number for the patient to call, I called the patient and she stated she had just called them today to get the renewal.  Bettymae Yott,cma

## 2022-07-06 ENCOUNTER — Other Ambulatory Visit: Payer: Self-pay | Admitting: Family Medicine

## 2022-07-06 DIAGNOSIS — E119 Type 2 diabetes mellitus without complications: Secondary | ICD-10-CM

## 2022-07-09 NOTE — Telephone Encounter (Signed)
Dr. Mortimer Fries, please advise if okay to refill albuterol. Rx not prescribed by our office.

## 2022-07-09 NOTE — Telephone Encounter (Signed)
Dr. Mortimer Fries, please advise.  Covered alternative to [name] is trelegy. She would also like refill on albuterol HFA.

## 2022-07-12 MED ORDER — ALBUTEROL SULFATE HFA 108 (90 BASE) MCG/ACT IN AERS: 2.0000 | INHALATION_SPRAY | Freq: Four times a day (QID) | RESPIRATORY_TRACT | 0 refills | Status: DC | PRN

## 2022-07-12 NOTE — Telephone Encounter (Signed)
Spoke to patient via telephone and relayed below message/recommendations.  Albuterol has been sent to preferred pharmacy.  She stated that she can not use trelegy due to reaction to Methylprednisolone. It caused a puffy face.  Dr. Patsey Berthold, please advise. thanks

## 2022-07-12 NOTE — Telephone Encounter (Signed)
Spoke to patient via telephone.  Patient is requesting refill on albuterol. This was not prescribed by our office previously.  She stated that she had a reaction to [place]. She can not recall the reaction. Covered alternative trelegy. She would like a refill today, as she is currently out of medication.  Dr. Patsey Berthold, please advise. Dr. Mortimer Fries is unavailable.

## 2022-07-12 NOTE — Telephone Encounter (Signed)
It is okay to call in albuterol inhaler for use as needed, please give only 1 inhaler without refills Dr. Mortimer Fries will have to approve other refills.  Can give a fill of Trelegy 100, 1 puff daily, 1 inhaler no refills further refills need to be approved by Dr. Juliann Mule.

## 2022-07-12 NOTE — Telephone Encounter (Signed)
Patient does not understand why inhalers cannot be called in. Patient would like to know if a NP can approve medications being called in since she needs inhalers before the end of the day.

## 2022-07-15 ENCOUNTER — Telehealth: Payer: Self-pay | Admitting: Pharmacy Technician

## 2022-07-15 DIAGNOSIS — Z596 Low income: Secondary | ICD-10-CM

## 2022-07-15 NOTE — Progress Notes (Signed)
[facility])                                            [place] Team    07/15/2022  Kristina Mccoy 1956-09-03 [id number]                                      Medication Assistance Referral-FOR 2024 RE ENROLLMENT  Referral From: [place]  Medication/Company: [name] Patient application portion:  Mailed Provider application portion: Faxed  to Dr. [name] Provider address/fax verified via: Office website  Medication/Company: [name] / [employer] Patient application portion:  Mailed Provider application portion: Faxed  to Dr. [name] Provider address/fax verified via: Office website   [name] P. [name] Name, [place]  [phone]

## 2022-07-16 ENCOUNTER — Telehealth: Payer: Self-pay

## 2022-07-16 ENCOUNTER — Telehealth: Payer: Self-pay | Admitting: Family Medicine

## 2022-07-16 NOTE — Telephone Encounter (Signed)
LVM for patient to call back.   Kristina Mccoy,cma  

## 2022-07-16 NOTE — Telephone Encounter (Signed)
Please let the patient know that we are not going to end up doing the injectable cholesterol medication that we talked about in our office. If she would like to pursue this I could refer her to the lipid clinic in [place].

## 2022-07-16 NOTE — Telephone Encounter (Signed)
LVM for patient to call back to inform her that her medication Kristina Mccoy has arrived and is ready and available for pickup. Marquet Faircloth,cma

## 2022-07-17 NOTE — Telephone Encounter (Signed)
LVM for patient to call back.   Avanti Jetter,cma  

## 2022-07-18 ENCOUNTER — Other Ambulatory Visit: Payer: Self-pay

## 2022-07-18 DIAGNOSIS — Z794 Long term (current) use of insulin: Secondary | ICD-10-CM

## 2022-07-18 MED ORDER — INSULIN ASPART 100 UNIT/ML FLEXPEN: 12.0000 [IU] | PEN_INJECTOR | Freq: Three times a day (TID) | SUBCUTANEOUS | 3 refills | Status: AC

## 2022-07-19 NOTE — Telephone Encounter (Signed)
I called and spoke with the patient and informed her that the injectable cholesterol medication could not be administered here and she declined the lipid clinic.  I also informed her that her medication from patient assistance has arrived and she can pick it up before 5 pm and she understood.   Shanta Dorvil,cma

## 2022-07-19 NOTE — Telephone Encounter (Signed)
Patient states she just had oral surgery, so she won't be able to pick up this medication today.  Patient states she is planning to pick it up next week, probably Monday (07/22/2022).

## 2022-07-23 DIAGNOSIS — E785 Hyperlipidemia, unspecified: Secondary | ICD-10-CM | POA: Diagnosis not present

## 2022-07-23 DIAGNOSIS — R002 Palpitations: Secondary | ICD-10-CM | POA: Diagnosis not present

## 2022-07-23 DIAGNOSIS — I10 Essential (primary) hypertension: Secondary | ICD-10-CM | POA: Diagnosis not present

## 2022-07-23 DIAGNOSIS — I219 Acute myocardial infarction, unspecified: Secondary | ICD-10-CM | POA: Diagnosis not present

## 2022-07-23 DIAGNOSIS — E1169 Type 2 diabetes mellitus with other specified complication: Secondary | ICD-10-CM | POA: Diagnosis not present

## 2022-08-01 ENCOUNTER — Other Ambulatory Visit: Payer: Self-pay | Admitting: Family Medicine

## 2022-08-01 DIAGNOSIS — G4700 Insomnia, unspecified: Secondary | ICD-10-CM

## 2022-08-06 DIAGNOSIS — R002 Palpitations: Secondary | ICD-10-CM | POA: Diagnosis not present

## 2022-08-13 ENCOUNTER — Telehealth: Payer: Self-pay | Admitting: Family Medicine

## 2022-08-13 DIAGNOSIS — F419 Anxiety disorder, unspecified: Secondary | ICD-10-CM

## 2022-08-13 MED ORDER — ALPRAZOLAM 0.5 MG PO TABS: ORAL_TABLET | ORAL | 1 refills | Status: DC

## 2022-08-13 NOTE — Telephone Encounter (Signed)
Pt.notified

## 2022-08-13 NOTE — Telephone Encounter (Signed)
Pt need a refill on [facility]AZolam sent [facility] and want to be called when the prescription has been sent in

## 2022-08-13 NOTE — Telephone Encounter (Signed)
Sent to pharmacy 

## 2022-08-19 DIAGNOSIS — G4733 Obstructive sleep apnea (adult) (pediatric): Secondary | ICD-10-CM | POA: Diagnosis not present

## 2022-08-20 DIAGNOSIS — E119 Type 2 diabetes mellitus without complications: Secondary | ICD-10-CM | POA: Diagnosis not present

## 2022-08-20 LAB — HM DIABETES EYE EXAM

## 2022-09-02 ENCOUNTER — Other Ambulatory Visit: Payer: Self-pay | Admitting: Family Medicine

## 2022-09-03 ENCOUNTER — Telehealth: Payer: Self-pay

## 2022-09-03 NOTE — Telephone Encounter (Signed)
Received 1 box of Basaglar 100 unit/mL injection pens. Called Patient to let her know the medication is here and ready for pick up.

## 2022-09-11 ENCOUNTER — Other Ambulatory Visit: Payer: Self-pay | Admitting: Family Medicine

## 2022-09-11 DIAGNOSIS — K219 Gastro-esophageal reflux disease without esophagitis: Secondary | ICD-10-CM

## 2022-09-17 ENCOUNTER — Ambulatory Visit (INDEPENDENT_AMBULATORY_CARE_PROVIDER_SITE_OTHER): Payer: PPO | Admitting: Family Medicine

## 2022-09-17 ENCOUNTER — Encounter: Payer: Self-pay | Admitting: Family Medicine

## 2022-09-17 DIAGNOSIS — J989 Respiratory disorder, unspecified: Secondary | ICD-10-CM

## 2022-09-17 DIAGNOSIS — J0110 Acute frontal sinusitis, unspecified: Secondary | ICD-10-CM | POA: Diagnosis not present

## 2022-09-17 DIAGNOSIS — R6883 Chills (without fever): Secondary | ICD-10-CM

## 2022-09-17 MED ORDER — AMOXICILLIN-POT CLAVULANATE 875-125 MG PO TABS: 1.0000 | ORAL_TABLET | Freq: Two times a day (BID) | ORAL | 0 refills | Status: DC

## 2022-09-17 NOTE — Progress Notes (Signed)
Virtual Visit via Telephone Note  I connected with Kristina Mccoy on 10/04/22 at 1525 by telephone and verified that I am speaking with the correct person using two identifiers. Kristina Mccoy is currently located at home and  is currently alone during this visit. The provider, Carollee Leitz, MD, is located in their office at time of visit.  I discussed the limitations, risks, security and privacy concerns of performing an evaluation and management service by telephone and the availability of in person appointments. I also discussed with the patient that there may be a patient responsible charge related to this service. The patient expressed understanding and agreed to proceed.  Subjective: PCP: Kristina Haven, MD  Chief Complaint  Patient presents with   Acute Visit    Consistent chills with Intense Body aches Sore throat Runny nose Left ear pain since last weekend  Body aches, chills, sneezing, headache, coughing & fatigued since 08/22/22  Pt. States she got better for 2 weeks then it came back with a vegenace    Symptoms started November 23.  Patient reports body aches, chills, fatigue, left sore ear and sinus pressure.  Has had some mild shortness of breath requiring increased use of albuterol therapy.  Has not worsened.  Endorses mild frontal headaches and nasal congestion.  Denies any fevers, chest pain or difficulty breathing.  Has been using Nasonex and Mucinex D.  No recent home COVID testing.  Reports neighbor has had similar symptoms.  Recently traveled to [place] to visit a sick brother who was in hospital.  Has not had COVID or flu vaccines.  ROS: Per HPI  Current Outpatient Medications:    albuterol (VENTOLIN HFA) 108 (90 Base) MCG/ACT inhaler, Inhale 2 puffs into the lungs every 4 (four) hours as needed for wheezing or shortness of breath., Disp: 18 g, Rfl: 0   albuterol (VENTOLIN HFA) 108 (90 Base) MCG/ACT inhaler, Inhale 2 puffs into the lungs every 6 (six)  hours as needed for wheezing or shortness of breath., Disp: 18 g, Rfl: 0   ALPRAZolam (XANAX) 0.5 MG tablet, TAKE 1 TABLET BY MOUTH 4 TIMES A DAY AS NEEDED FOR ANXIETY, Disp: 120 tablet, Rfl: 1   amoxicillin-clavulanate (AUGMENTIN) 875-125 MG tablet, Take 1 tablet by mouth 2 (two) times daily., Disp: 20 tablet, Rfl: 0   aspirin EC 81 MG tablet, Take 1 tablet (81 mg total) by mouth daily., Disp: 90 tablet, Rfl: 3   Azelastine HCl 137 MCG/SPRAY SOLN, PLACE 2 SPRAYS INTO BOTH NOSTRILS 2 (TWO) TIMES DAILY. USE IN EACH NOSTRIL AS DIRECTED, Disp: 30 mL, Rfl: 2   Biotin 10 MG TABS, Take by mouth., Disp: , Rfl:    Budeson-Glycopyrrol-Formoterol (BREZTRI AEROSPHERE) 160-9-4.8 MCG/ACT AERO, Inhale 2 puffs into the lungs in the morning and at bedtime., Disp: 1 each, Rfl: 4   Cholecalciferol (VITAMIN D3) 1000 units CAPS, Take by mouth daily. , Disp: , Rfl:    Continuous Blood Gluc Sensor (FREESTYLE LIBRE 14 DAY SENSOR) MISC, SMARTSIG:1 Each Topical Every 2 Weeks, Disp: , Rfl:    Cyanocobalamin 1000 MCG CAPS, Take by mouth. Takes occassionally, Disp: , Rfl:    Emollient (CERAVE) CREA, Apply topically daily. , Disp: , Rfl:    ezetimibe (ZETIA) 10 MG tablet, Take 10 mg by mouth daily., Disp: , Rfl:    Glucagon 3 MG/DOSE POWD, 1 spray into the left nostril every fifteen (15) minutes as needed (low blood sugar requiring assistance)., Disp: , Rfl:    hydrocortisone  2.5 % cream, APPLY TO AFFECTED AREA TWICE A DAY, Disp: , Rfl:    ibuprofen (ADVIL) 200 MG tablet, Take 200 mg by mouth every 6 (six) hours as needed., Disp: , Rfl:    insulin aspart (NOVOLOG) 100 UNIT/ML FlexPen, Inject 12 Units into the skin 3 (three) times daily with meals., Disp: 15 mL, Rfl: 3   Insulin Glargine (BASAGLAR KWIKPEN) 100 UNIT/ML, DIAL AND INJECT 20 UNITS DAILY (MAX 20 UNITS DAILY), Disp: 30 mL, Rfl: 0   Insulin Pen Needle 32G X 6 MM MISC, Use 3 times daily with meals., Disp: 1 each, Rfl: 3   ketoconazole (NIZORAL) 2 % cream, Apply 1  application topically 2 (two) times daily as needed., Disp: , Rfl: 3   ketoconazole (NIZORAL) 2 % shampoo, Apply 1 application topically as needed., Disp: , Rfl: 11   metoprolol succinate (TOPROL-XL) 25 MG 24 hr tablet, Take 1 tablet (25 mg total) by mouth daily., Disp: 90 tablet, Rfl: 3   ondansetron (ZOFRAN) 4 MG tablet, TAKE 1 TABLET BY MOUTH EVERY 8 HOURS AS NEEDED FOR NAUSEA AND VOMITING, Disp: 18 tablet, Rfl: 1   pantoprazole (PROTONIX) 40 MG tablet, TAKE 1 TABLET BY MOUTH EVERY DAY, Disp: 90 tablet, Rfl: 1   promethazine (PHENERGAN) 12.5 MG tablet, Take 1 tablet (12.5 mg total) by mouth every 8 (eight) hours as needed for nausea or vomiting., Disp: 20 tablet, Rfl: 0   pseudoephedrine-guaifenesin (MUCINEX D) 60-600 MG 12 hr tablet, Take 1 tablet by mouth 2 (two) times daily as needed. , Disp: , Rfl:    rOPINIRole (REQUIP) 0.25 MG tablet, Take 0.25 mg by mouth daily as needed., Disp: , Rfl:    traMADol (ULTRAM) 50 MG tablet, TAKE 1 TABLET BY MOUTH EVERY 6 HOURS AS NEEDED. FOR PAIN, Disp: 28 tablet, Rfl: 0   traZODone (DESYREL) 50 MG tablet, TAKE 1/2 TO 1 TABLET BY MOUTH AT BEDTIME AS NEEDED FOR SLEEP, Disp: 90 tablet, Rfl: 2   triamcinolone (NASACORT) 55 MCG/ACT AERO nasal inhaler, Place 2 sprays into the nose daily., Disp: , Rfl:    ursodiol (ACTIGALL) 500 MG tablet, Take 1,000 mg by mouth daily., Disp: , Rfl:    famotidine (PEPCID) 20 MG tablet, Take 1 tablet (20 mg total) by mouth at bedtime for 14 days. (Patient taking differently: Take 20 mg by mouth daily. LRN), Disp: 14 tablet, Rfl: 0  Observations/Objective: A&O  No respiratory distress or wheezing audible over the phone Mood, judgement, and thought processes all WNL  Assessment and Plan: Acute frontal sinusitis, recurrence not specified Assessment & Plan: Acute.  Given symptoms of frontal headaches and nasal congestion with chills we will plan to treat with antibiotics.  Exam limited given nature of visit.  Does not appear to be  in any respiratory distress during conversation over telephone. Recommend probiotics daily while on antibiotics If symptoms do not improve follow-up with PCP  Orders: -     Amoxicillin-Pot Clavulanate; Take 1 tablet by mouth 2 (two) times daily.  Dispense: 20 tablet; Refill: 0  Respiratory illness Assessment & Plan: Symptoms of body aches and chills for greater than 3 weeks.  Suspect viral etiology given neighbor with similar symptoms. Recommend patient to come to clinic in a.m. for COVID, influenza AB and RSV testing Treating with antibiotics for acute sinusitis. Follow-up with PCP if no improvement in symptoms. Strict return precautions provided   Orders: -     COVID-19, Flu A+B and RSV; Future    Follow Up Instructions:  Return if symptoms worsen or fail to improve.  I discussed the assessment and treatment plan with the patient. The patient was provided an opportunity to ask questions and all were answered. The patient agreed with the plan and demonstrated an understanding of the instructions.   The patient was advised to call back or seek an in-person evaluation if the symptoms worsen or if the condition fails to improve as anticipated.  The above assessment and management plan was discussed with the patient. The patient verbalized understanding of and has agreed to the management plan. Patient is aware to call the clinic if symptoms persist or worsen. Patient is aware when to return to the clinic for a follow-up visit. Patient educated on when it is appropriate to go to the emergency department.   Time call ended: 1600  I provided 20 minutes of non-face-to-face time during this encounter.  Carollee Leitz, MD

## 2022-09-26 ENCOUNTER — Telehealth: Payer: Self-pay

## 2022-09-26 NOTE — Telephone Encounter (Signed)
LVM for patient to call back.   Left detailed message informing the patient to pick up her Basaglar from patient assistance.  Mariette Cowley,cma

## 2022-09-27 NOTE — Telephone Encounter (Signed)
Medication has been picked up.  

## 2022-10-04 ENCOUNTER — Encounter: Payer: Self-pay | Admitting: Family Medicine

## 2022-10-04 NOTE — Patient Instructions (Addendum)
It was a pleasure meeting you today. Thank you for allowing me to take part in your health care.  Our goals for today as we discussed include:  Start Augmentin 1 tablet 2 times a day for 5 days Start probiotics 1 daily while on antibiotics and for 2 weeks after stopping antibiotics. Continue Nasonex Can use humidifier at night Avoid allergens COVID, flu a and B, RSV testing If any worsening in symptoms please notify MD.   If you have any questions or concerns, please do not hesitate to call the office at [phone].  I look forward to our next visit and until then take care and stay safe.  Regards,   Carollee Leitz, MD   [facility]

## 2022-10-04 NOTE — Assessment & Plan Note (Signed)
Acute.  Given symptoms of frontal headaches and nasal congestion with chills we will plan to treat with antibiotics.  Exam limited given nature of visit.  Does not appear to be in any respiratory distress during conversation over telephone. Recommend probiotics daily while on antibiotics If symptoms do not improve follow-up with PCP

## 2022-10-04 NOTE — Assessment & Plan Note (Signed)
Symptoms of body aches and chills for greater than 3 weeks.  Suspect viral etiology given neighbor with similar symptoms. Recommend patient to come to clinic in a.m. for COVID, influenza AB and RSV testing Treating with antibiotics for acute sinusitis. Follow-up with PCP if no improvement in symptoms. Strict return precautions provided

## 2022-10-07 ENCOUNTER — Ambulatory Visit (INDEPENDENT_AMBULATORY_CARE_PROVIDER_SITE_OTHER): Payer: PPO | Admitting: Family Medicine

## 2022-10-07 ENCOUNTER — Encounter: Payer: Self-pay | Admitting: Family Medicine

## 2022-10-07 VITALS — BP 110/70 | HR 71 | Temp 98.5°F | Ht 64.0 in | Wt 169.6 lb

## 2022-10-07 DIAGNOSIS — G4700 Insomnia, unspecified: Secondary | ICD-10-CM | POA: Diagnosis not present

## 2022-10-07 DIAGNOSIS — F32A Depression, unspecified: Secondary | ICD-10-CM | POA: Diagnosis not present

## 2022-10-07 DIAGNOSIS — G894 Chronic pain syndrome: Secondary | ICD-10-CM | POA: Diagnosis not present

## 2022-10-07 DIAGNOSIS — F419 Anxiety disorder, unspecified: Secondary | ICD-10-CM | POA: Diagnosis not present

## 2022-10-07 MED ORDER — MIRTAZAPINE 7.5 MG PO TABS: 7.5000 mg | ORAL_TABLET | Freq: Every day | ORAL | 3 refills | Status: DC

## 2022-10-07 NOTE — Patient Instructions (Signed)
Nice to see you.  We are going to start mirtazapine to see if that helps with your anxiety and your sleep.  If you are excessively drowsy with this please let us know.  If you are excessively hungry and started gaining weight on this please let us know as well.

## 2022-10-07 NOTE — Progress Notes (Signed)
[name], MD Phone: [phone]  Kristina Mccoy is a 67 y.o. female who presents today for f/u.  Anxiety: Patient notes chronic issues with anxiety.  No depression.  She takes Xanax 4 times a day.  She notes no SI.  She has been having trouble sleeping.  Sleeping difficulty: Patient has trouble falling asleep and staying asleep.  She gets about 4 hours of sleep nightly.  She does not have any caffeine after 3 PM.  No alcohol.  She does look at screens the hour before bed.  Trazodone sometimes helps though sometimes does not.  She takes 50 mg nightly.  Chronic pain: Patient continues to take tramadol as needed for severe pain.  She typically takes Advil 400 mg 3 times daily.  She recently tried meloxicam through her rheumatologist though that did not help with any of her symptoms.  Social History   Tobacco Use  Smoking Status Former   Packs/day: 1.00   Years: 30.00   Total pack years: 30.00   Types: Cigarettes   Quit date: 02/28/2013   Years since quitting: 9.6  Smokeless Tobacco Never    Current Outpatient Medications on File Prior to Visit  Medication Sig Dispense Refill   albuterol (VENTOLIN HFA) 108 (90 Base) MCG/ACT inhaler Inhale 2 puffs into the lungs every 4 (four) hours as needed for wheezing or shortness of breath. 18 g 0   ALPRAZolam (XANAX) 0.5 MG tablet TAKE 1 TABLET BY MOUTH 4 TIMES A DAY AS NEEDED FOR ANXIETY 120 tablet 1   aspirin EC 81 MG tablet Take 1 tablet (81 mg total) by mouth daily. 90 tablet 3   Biotin 10 MG TABS Take by mouth.     Cholecalciferol (VITAMIN D3) 1000 units CAPS Take by mouth daily.      Continuous Blood Gluc Sensor (FREESTYLE LIBRE 14 DAY SENSOR) MISC SMARTSIG:1 Each Topical Every 2 Weeks     Cyanocobalamin 1000 MCG CAPS Take by mouth. Takes occassionally     Emollient (CERAVE) CREA Apply topically daily.      ezetimibe (ZETIA) 10 MG tablet Take 10 mg by mouth daily.     Glucagon 3 MG/DOSE POWD 1 spray into the left nostril every  fifteen (15) minutes as needed (low blood sugar requiring assistance).     hydrocortisone 2.5 % cream APPLY TO AFFECTED AREA TWICE A DAY     ibuprofen (ADVIL) 200 MG tablet Take 200 mg by mouth every 6 (six) hours as needed.     insulin aspart (NOVOLOG) 100 UNIT/ML FlexPen Inject 12 Units into the skin 3 (three) times daily with meals. 15 mL 3   Insulin Glargine (BASAGLAR KWIKPEN) 100 UNIT/ML DIAL AND INJECT 20 UNITS DAILY (MAX 20 UNITS DAILY) 30 mL 0   Insulin Pen Needle 32G X 6 MM MISC Use 3 times daily with meals. 1 each 3   ketoconazole (NIZORAL) 2 % cream Apply 1 application topically 2 (two) times daily as needed.  3   ketoconazole (NIZORAL) 2 % shampoo Apply 1 application topically as needed.  11   Lancets (ONETOUCH DELICA PLUS [id number]) MISC 4 (four) times daily.     metoprolol succinate (TOPROL-XL) 25 MG 24 hr tablet Take 1 tablet (25 mg total) by mouth daily. 90 tablet 3   ondansetron (ZOFRAN) 4 MG tablet TAKE 1 TABLET BY MOUTH EVERY 8 HOURS AS NEEDED FOR NAUSEA AND VOMITING 18 tablet 1   ONETOUCH ULTRA test strip 4 (four) times daily. for testing  pantoprazole (PROTONIX) 40 MG tablet TAKE 1 TABLET BY MOUTH EVERY DAY 90 tablet 1   promethazine (PHENERGAN) 12.5 MG tablet Take 1 tablet (12.5 mg total) by mouth every 8 (eight) hours as needed for nausea or vomiting. 20 tablet 0   pseudoephedrine-guaifenesin (MUCINEX D) 60-600 MG 12 hr tablet Take 1 tablet by mouth 2 (two) times daily as needed.      traMADol (ULTRAM) 50 MG tablet TAKE 1 TABLET BY MOUTH EVERY 6 HOURS AS NEEDED. FOR PAIN 28 tablet 0   triamcinolone (NASACORT) 55 MCG/ACT AERO nasal inhaler Place 2 sprays into the nose daily.     ursodiol (ACTIGALL) 500 MG tablet Take 1,000 mg by mouth daily.     albuterol (VENTOLIN HFA) 108 (90 Base) MCG/ACT inhaler Inhale 2 puffs into the lungs every 6 (six) hours as needed for wheezing or shortness of breath. (Patient not taking: Reported on 10/07/2022) 18 g 0   amoxicillin-clavulanate  (AUGMENTIN) 875-125 MG tablet Take 1 tablet by mouth 2 (two) times daily. (Patient not taking: Reported on 10/07/2022) 20 tablet 0   Azelastine HCl 137 MCG/SPRAY SOLN PLACE 2 SPRAYS INTO BOTH NOSTRILS 2 (TWO) TIMES DAILY. USE IN EACH NOSTRIL AS DIRECTED (Patient not taking: Reported on 10/07/2022) 30 mL 2   Budeson-Glycopyrrol-Formoterol (BREZTRI AEROSPHERE) 160-9-4.8 MCG/ACT AERO Inhale 2 puffs into the lungs in the morning and at bedtime. (Patient not taking: Reported on 10/07/2022) 1 each 4   famotidine (PEPCID) 20 MG tablet Take 1 tablet (20 mg total) by mouth at bedtime for 14 days. (Patient taking differently: Take 20 mg by mouth daily. LRN) 14 tablet 0   rOPINIRole (REQUIP) 0.25 MG tablet Take 0.25 mg by mouth daily as needed. (Patient not taking: Reported on 10/07/2022)     No current facility-administered medications on file prior to visit.     ROS see history of present illness  Objective  Physical Exam Vitals:   10/07/22 1505  BP: 110/70  Pulse: 71  Temp: 98.5 F (36.9 C)  SpO2: 99%    BP Readings from Last 3 Encounters:  10/07/22 110/70  07/03/22 118/60  03/06/22 126/76   Wt Readings from Last 3 Encounters:  10/07/22 169 lb 9.6 oz (76.9 kg)  07/03/22 170 lb 6.4 oz (77.3 kg)  03/25/22 166 lb (75.3 kg)    Physical Exam Constitutional:      General: She is not in acute distress.    Appearance: She is not diaphoretic.  Cardiovascular:     Rate and Rhythm: Normal rate and regular rhythm.     Heart sounds: Normal heart sounds.  Pulmonary:     Effort: Pulmonary effort is normal.     Breath sounds: Normal breath sounds.  Skin:    General: Skin is warm and dry.  Neurological:     Mental Status: She is alert.      Assessment/Plan: Please see individual problem list.  Anxiety and depression Assessment & Plan: Chronic issue.  Anxiety continues to be an issue.  This may be contributing to her sleeping issues.  We will try to initiate mirtazapine 7.5 mg nightly.   Discussed risk of drowsiness and increasing weight/appetite with this medication.  If she has excessive drowsiness the day after taking this she will let us know.  Discussed that starting on something like this may help reduce her use of Xanax.  Orders: -     Mirtazapine; Take 1 tablet (7.5 mg total) by mouth at bedtime.  Dispense: 30 tablet; Refill: 3  Chronic pain disorder Assessment & Plan: Chronic issue.  She can continue tramadol 50 mg every 6 hours as needed for pain.  She can continue ibuprofen 400 mg 3 times daily.  Discussed making sure she continues her Protonix and continues to take this with food.  If she has any upset stomach with this she can let us know.  We will continue to monitor her kidney function periodically.   Insomnia, unspecified type Assessment & Plan: Chronic issue.  We will see if this improves with use of mirtazapine for her anxiety.  She will discontinue the trazodone while she starts on the mirtazapine.  I did advise to cut out caffeine by noon each day.  Discussed no screen time the hour before bed.      Return in about 2 months (around 12/06/2022).   [name], MD [place]

## 2022-10-08 NOTE — Assessment & Plan Note (Signed)
Chronic issue.  She can continue tramadol 50 mg every 6 hours as needed for pain.  She can continue ibuprofen 400 mg 3 times daily.  Discussed making sure she continues her Protonix and continues to take this with food.  If she has any upset stomach with this she can let us know.  We will continue to monitor her kidney function periodically.

## 2022-10-08 NOTE — Assessment & Plan Note (Addendum)
Chronic issue.  We will see if this improves with use of mirtazapine for her anxiety.  She will discontinue the trazodone while she starts on the mirtazapine.  I did advise to cut out caffeine by noon each day.  Discussed no screen time the hour before bed.

## 2022-10-08 NOTE — Assessment & Plan Note (Signed)
Chronic issue.  Anxiety continues to be an issue.  This may be contributing to her sleeping issues.  We will try to initiate mirtazapine 7.5 mg nightly.  Discussed risk of drowsiness and increasing weight/appetite with this medication.  If she has excessive drowsiness the day after taking this she will let us know.  Discussed that starting on something like this may help reduce her use of Xanax.

## 2022-10-09 ENCOUNTER — Telehealth: Payer: Self-pay | Admitting: Pharmacy Technician

## 2022-10-09 DIAGNOSIS — Z596 Low income: Secondary | ICD-10-CM

## 2022-10-09 NOTE — Progress Notes (Signed)
[place] [facility])                                            [place] Team    10/09/2022  Moana Munford [birth year] [id number]  Received both patient and provider portion(s) of patient assistance application(s) for Novolog. Faxed completed application and required documents into [employer].  Still awaiting patient's portion of Heritage manager for [employer].  Breely Panik P. Hagen Bohorquez, [place]  [phone]

## 2022-10-11 ENCOUNTER — Other Ambulatory Visit: Payer: Self-pay | Admitting: Family Medicine

## 2022-10-11 DIAGNOSIS — F32A Depression, unspecified: Secondary | ICD-10-CM

## 2022-10-13 ENCOUNTER — Encounter: Payer: Self-pay | Admitting: Family Medicine

## 2022-10-25 ENCOUNTER — Telehealth: Payer: Self-pay | Admitting: Pharmacy Technician

## 2022-10-25 ENCOUNTER — Telehealth: Payer: Self-pay | Admitting: Pharmacist

## 2022-10-25 DIAGNOSIS — Z596 Low income: Secondary | ICD-10-CM

## 2022-10-25 NOTE — Progress Notes (Addendum)
[place] [facility])                                            [place] Team    10/25/2022  Pristine Gladhill 1956-06-20 [id number]  Care coordination call placed to [place] in regard to Novolog application.  Spoke to [place] who informs patient is APPROVED 10/18/22-09/30/23. Medication will automatically fill and ship to provider's address on file based on last fill date in 2023. Patient may call [place] at [phone] if shipment has not arrived and patient does not have sufficient supply.   Still awaiting patient to mail back Heritage manager for [employer]. Spoke to patient about the [place] application. Patient informs she is holding off on completing this application until after her Endocrinology appointment in April as she is not sure she will remain on the medication.  Jayliani Wanner P. Torin Whisner, [place]  [phone]

## 2022-10-25 NOTE — Progress Notes (Signed)
[facility] [facility]) [place]   10/25/2022  Kristina Mccoy 07-06-1956 [id number]  Reason for referral:  Follow Up call Regarding Basaglar  Referral source:  [facility]  Current insurance: [employer]  Reason for call: Follow Up on Basaglar  Outreach:  Unsuccessful telephone call attempt #1 to patient.   HIPAA compliant voicemail left requesting a return call  Plan:  -I will make another outreach attempt to patient within 3-4 business days.  Thank you for allowing [facility] pharmacy to be a part of this patient's care.

## 2022-10-28 ENCOUNTER — Telehealth: Payer: Self-pay | Admitting: Pharmacist

## 2022-10-28 NOTE — Progress Notes (Signed)
[facility])                                            [place] Team    10/28/2022  Emberlie Gotcher 1956-04-03 [id number]   Placed telephonic outreach to Ms. Morene Antu Mander regarding Engineer, agricultural. Patient reports that she has enough Basaglar on hand and will be speaking with her endocrinologist in April regarding if she will continue taking the medication. Patient reports that she is on a reduced dose at this time per instructions from her Endocrinologist. Patient was appreciative of my call. Patient will let [facility] know if she changes medication or continues on Basaglar.   [name], PharmD [place] Clinical Pharmacist Direct Dial: [phone]

## 2022-10-29 ENCOUNTER — Telehealth: Payer: Self-pay

## 2022-10-29 NOTE — Telephone Encounter (Signed)
I called the patient and informed her that he medication Novolog has arrived and is availble for pickup and she understood.  Ellenie Salome,cma

## 2022-11-07 DIAGNOSIS — M3508 Sjogren syndrome with gastrointestinal involvement: Secondary | ICD-10-CM | POA: Diagnosis not present

## 2022-11-07 DIAGNOSIS — M159 Polyosteoarthritis, unspecified: Secondary | ICD-10-CM | POA: Diagnosis not present

## 2022-11-07 DIAGNOSIS — K743 Primary biliary cirrhosis: Secondary | ICD-10-CM | POA: Diagnosis not present

## 2022-11-07 DIAGNOSIS — M3506 Sjogren syndrome with peripheral nervous system involvement: Secondary | ICD-10-CM | POA: Diagnosis not present

## 2022-11-07 DIAGNOSIS — G63 Polyneuropathy in diseases classified elsewhere: Secondary | ICD-10-CM | POA: Diagnosis not present

## 2022-11-11 DIAGNOSIS — M3508 Sjogren syndrome with gastrointestinal involvement: Secondary | ICD-10-CM | POA: Diagnosis not present

## 2022-11-13 ENCOUNTER — Encounter: Payer: Self-pay | Admitting: Family Medicine

## 2022-11-13 DIAGNOSIS — N179 Acute kidney failure, unspecified: Secondary | ICD-10-CM

## 2022-11-13 NOTE — Telephone Encounter (Signed)
Called pt and scheduled lab appointment.

## 2022-11-13 NOTE — Telephone Encounter (Signed)
You can schedule her for labs. Order placed.

## 2022-11-18 DIAGNOSIS — G4733 Obstructive sleep apnea (adult) (pediatric): Secondary | ICD-10-CM | POA: Diagnosis not present

## 2022-11-27 ENCOUNTER — Other Ambulatory Visit (INDEPENDENT_AMBULATORY_CARE_PROVIDER_SITE_OTHER): Payer: PPO

## 2022-11-27 DIAGNOSIS — N179 Acute kidney failure, unspecified: Secondary | ICD-10-CM | POA: Diagnosis not present

## 2022-11-27 LAB — BASIC METABOLIC PANEL
BUN: 14 mg/dL (ref 6–23)
CO2: 26 mEq/L (ref 19–32)
Calcium: 9.3 mg/dL (ref 8.4–10.5)
Chloride: 94 mEq/L — ABNORMAL LOW (ref 96–112)
Creatinine, Ser: 0.92 mg/dL (ref 0.40–1.20)
GFR: 65.64 mL/min (ref >60.00)
Glucose, Bld: 219 mg/dL — ABNORMAL HIGH (ref 70–99)
Potassium: 4 mEq/L (ref 3.5–5.1)
Sodium: 129 mEq/L — ABNORMAL LOW (ref 135–145)

## 2022-12-03 ENCOUNTER — Other Ambulatory Visit: Payer: Self-pay

## 2022-12-03 DIAGNOSIS — E871 Hypo-osmolality and hyponatremia: Secondary | ICD-10-CM

## 2022-12-05 ENCOUNTER — Other Ambulatory Visit (INDEPENDENT_AMBULATORY_CARE_PROVIDER_SITE_OTHER): Payer: PPO

## 2022-12-05 DIAGNOSIS — E871 Hypo-osmolality and hyponatremia: Secondary | ICD-10-CM

## 2022-12-05 LAB — BASIC METABOLIC PANEL
BUN: 14 mg/dL (ref 6–23)
CO2: 29 mEq/L (ref 19–32)
Calcium: 9.5 mg/dL (ref 8.4–10.5)
Chloride: 97 mEq/L (ref 96–112)
Creatinine, Ser: 1.04 mg/dL (ref 0.40–1.20)
GFR: 56.65 mL/min — ABNORMAL LOW (ref >60.00)
Glucose, Bld: 207 mg/dL — ABNORMAL HIGH (ref 70–99)
Potassium: 3.9 mEq/L (ref 3.5–5.1)
Sodium: 133 mEq/L — ABNORMAL LOW (ref 135–145)

## 2022-12-09 ENCOUNTER — Ambulatory Visit (INDEPENDENT_AMBULATORY_CARE_PROVIDER_SITE_OTHER): Payer: PPO | Admitting: Family Medicine

## 2022-12-09 ENCOUNTER — Encounter: Payer: Self-pay | Admitting: Family Medicine

## 2022-12-09 VITALS — BP 116/78 | HR 76 | Temp 97.7°F | Ht 64.0 in | Wt 168.0 lb

## 2022-12-09 DIAGNOSIS — N179 Acute kidney failure, unspecified: Secondary | ICD-10-CM | POA: Insufficient documentation

## 2022-12-09 DIAGNOSIS — G4700 Insomnia, unspecified: Secondary | ICD-10-CM

## 2022-12-09 DIAGNOSIS — R350 Frequency of micturition: Secondary | ICD-10-CM | POA: Diagnosis not present

## 2022-12-09 DIAGNOSIS — F419 Anxiety disorder, unspecified: Secondary | ICD-10-CM

## 2022-12-09 DIAGNOSIS — F32A Depression, unspecified: Secondary | ICD-10-CM | POA: Diagnosis not present

## 2022-12-09 LAB — POC URINALSYSI DIPSTICK (AUTOMATED)
Bilirubin, UA: NEGATIVE
Blood, UA: NEGATIVE
Glucose, UA: NEGATIVE
Ketones, UA: NEGATIVE
Leukocytes, UA: NEGATIVE
Nitrite, UA: NEGATIVE
Protein, UA: NEGATIVE
Spec Grav, UA: <=1.005 — AB (ref 1.010–1.025)
Urobilinogen, UA: 0.2 E.U./dL
pH, UA: 6 (ref 5.0–8.0)

## 2022-12-09 MED ORDER — ALPRAZOLAM 0.5 MG PO TABS: ORAL_TABLET | ORAL | 1 refills | Status: DC

## 2022-12-09 MED ORDER — TRAZODONE HCL 50 MG PO TABS: 100.0000 mg | ORAL_TABLET | Freq: Every evening | ORAL | 3 refills | Status: DC | PRN

## 2022-12-09 NOTE — Assessment & Plan Note (Signed)
Possibly related to reducing her water intake recently.  She will increase to 4 bottles of water daily and will recheck in 2 weeks.

## 2022-12-09 NOTE — Assessment & Plan Note (Signed)
Urinalysis was negative.  She will monitor with adequate hydration.

## 2022-12-09 NOTE — Progress Notes (Signed)
[name], MD Phone: [phone]  Kristina Mccoy is a 67 y.o. female who presents today for f/u.  Anxiety/depression: Patient has no depression.  Anxiety is always high.  The Xanax works well.  She had side effects with the mirtazapine and opted previously to not try anything else for this.  Insomnia: Patient notes her sleep is not good.  She takes 2 Xanax before bed and that helps her relax enough to fall asleep though she wakes up after 3 hours.  She has 2 cups of coffee in the morning and stopped drinking caffeinated beverages around 2 PM.  She tried to go to bed around 8 PM.  AKI: Noted on recent lab work.  She has been on candesartan 4 mg daily though has been on this for 6 months.  She has tried to reduce her water from 6 bottles to 3 bottles daily given her sodium being low previously.  She does note some urinary frequency recently with some bubbles in her urine and does have some swelling under her eyes and feels somewhat bloated.  Social History   Tobacco Use  Smoking Status Former   Packs/day: 1.00   Years: 30.00   Total pack years: 30.00   Types: Cigarettes   Quit date: 02/28/2013   Years since quitting: 9.7  Smokeless Tobacco Never    Current Outpatient Medications on File Prior to Visit  Medication Sig Dispense Refill   albuterol (VENTOLIN HFA) 108 (90 Base) MCG/ACT inhaler Inhale 2 puffs into the lungs every 4 (four) hours as needed for wheezing or shortness of breath. 18 g 0   aspirin EC 81 MG tablet Take 1 tablet (81 mg total) by mouth daily. 90 tablet 3   Biotin 10 MG TABS Take by mouth.     Cholecalciferol (VITAMIN D3) 1000 units CAPS Take by mouth daily.      Continuous Blood Gluc Sensor (FREESTYLE LIBRE 14 DAY SENSOR) MISC SMARTSIG:1 Each Topical Every 2 Weeks     Cyanocobalamin 1000 MCG CAPS Take by mouth. Takes occassionally     Emollient (CERAVE) CREA Apply topically daily.      ezetimibe (ZETIA) 10 MG tablet Take 10 mg by mouth daily.     Glucagon 3  MG/DOSE POWD 1 spray into the left nostril every fifteen (15) minutes as needed (low blood sugar requiring assistance).     hydrocortisone 2.5 % cream APPLY TO AFFECTED AREA TWICE A DAY     ibuprofen (ADVIL) 200 MG tablet Take 200 mg by mouth every 6 (six) hours as needed.     insulin aspart (NOVOLOG) 100 UNIT/ML FlexPen Inject 12 Units into the skin 3 (three) times daily with meals. 15 mL 3   Insulin Glargine (BASAGLAR KWIKPEN) 100 UNIT/ML DIAL AND INJECT 20 UNITS DAILY (MAX 20 UNITS DAILY) 30 mL 0   Insulin Pen Needle 32G X 6 MM MISC Use 3 times daily with meals. 1 each 3   ketoconazole (NIZORAL) 2 % cream Apply 1 application topically 2 (two) times daily as needed.  3   ketoconazole (NIZORAL) 2 % shampoo Apply 1 application topically as needed.  11   Lancets (ONETOUCH DELICA PLUS Q000111Q) MISC 4 (four) times daily.     metoprolol succinate (TOPROL-XL) 25 MG 24 hr tablet Take 1 tablet (25 mg total) by mouth daily. 90 tablet 3   ondansetron (ZOFRAN) 4 MG tablet TAKE 1 TABLET BY MOUTH EVERY 8 HOURS AS NEEDED FOR NAUSEA AND VOMITING 18 tablet 1   ONETOUCH  ULTRA test strip 4 (four) times daily. for testing     pantoprazole (PROTONIX) 40 MG tablet TAKE 1 TABLET BY MOUTH EVERY DAY 90 tablet 1   promethazine (PHENERGAN) 12.5 MG tablet Take 1 tablet (12.5 mg total) by mouth every 8 (eight) hours as needed for nausea or vomiting. 20 tablet 0   pseudoephedrine-guaifenesin (MUCINEX D) 60-600 MG 12 hr tablet Take 1 tablet by mouth 2 (two) times daily as needed.      traMADol (ULTRAM) 50 MG tablet TAKE 1 TABLET BY MOUTH EVERY 6 HOURS AS NEEDED. FOR PAIN 28 tablet 0   triamcinolone (NASACORT) 55 MCG/ACT AERO nasal inhaler Place 2 sprays into the nose daily.     ursodiol (ACTIGALL) 500 MG tablet Take 1,000 mg by mouth daily.     No current facility-administered medications on file prior to visit.     ROS see history of present illness  Objective  Physical Exam Vitals:   12/09/22 1537  BP: 116/78   Pulse: 76  Temp: 97.7 F (36.5 C)  SpO2: 98%    BP Readings from Last 3 Encounters:  12/09/22 116/78  10/07/22 110/70  07/03/22 118/60   Wt Readings from Last 3 Encounters:  12/09/22 168 lb (76.2 kg)  10/07/22 169 lb 9.6 oz (76.9 kg)  07/03/22 170 lb 6.4 oz (77.3 kg)    Physical Exam Constitutional:      General: She is not in acute distress.    Appearance: She is not diaphoretic.  Cardiovascular:     Rate and Rhythm: Normal rate and regular rhythm.     Heart sounds: Normal heart sounds.  Pulmonary:     Effort: Pulmonary effort is normal.     Breath sounds: Normal breath sounds.  Skin:    General: Skin is warm and dry.  Neurological:     Mental Status: She is alert.      Assessment/Plan: Please see individual problem list.  Urine frequency Assessment & Plan: Urinalysis was negative.  She will monitor with adequate hydration.  Orders: -     POCT Urinalysis Dipstick (Automated)  Anxiety and depression Assessment & Plan: Chronic issue.  Still has lots of anxiety.  She has been hesitant to start on anything other than Xanax.  At this time we will continue the Xanax 0.5 mg 4 times daily as needed for anxiety.  Orders: -     ALPRAZolam; TAKE 1 TABLET BY MOUTH 4 TIMES A DAY AS NEEDED FOR ANXIETY  Dispense: 120 tablet; Refill: 1  Insomnia, unspecified type Assessment & Plan: Chronic issue.  Poorly controlled.  She will increase her trazodone to 100 mg daily and see how this works for her.  If not beneficial she will let me know.  Orders: -     traZODone HCl; Take 2 tablets (100 mg total) by mouth at bedtime as needed for sleep.  Dispense: 60 tablet; Refill: 3  AKI (acute kidney injury) ([place]) Assessment & Plan: Possibly related to reducing her water intake recently.  She will increase to 4 bottles of water daily and will recheck in 2 weeks.  Orders: -     Basic metabolic panel; Future    Return in about 2 weeks (around 12/23/2022) for labs, 3 months PCP  anxiety.   [name], MD [place]

## 2022-12-09 NOTE — Assessment & Plan Note (Signed)
Chronic issue.  Still has lots of anxiety.  She has been hesitant to start on anything other than Xanax.  At this time we will continue the Xanax 0.5 mg 4 times daily as needed for anxiety.

## 2022-12-09 NOTE — Assessment & Plan Note (Signed)
Chronic issue.  Poorly controlled.  She will increase her trazodone to 100 mg daily and see how this works for her.  If not beneficial she will let me know.

## 2022-12-24 ENCOUNTER — Other Ambulatory Visit: Payer: PPO

## 2022-12-25 ENCOUNTER — Other Ambulatory Visit (INDEPENDENT_AMBULATORY_CARE_PROVIDER_SITE_OTHER): Payer: PPO

## 2022-12-25 DIAGNOSIS — N179 Acute kidney failure, unspecified: Secondary | ICD-10-CM

## 2022-12-26 LAB — BASIC METABOLIC PANEL
BUN: 15 mg/dL (ref 6–23)
CO2: 29 mEq/L (ref 19–32)
Calcium: 9.3 mg/dL (ref 8.4–10.5)
Chloride: 94 mEq/L — ABNORMAL LOW (ref 96–112)
Creatinine, Ser: 0.85 mg/dL (ref 0.40–1.20)
GFR: 72.14 mL/min (ref >60.00)
Glucose, Bld: 159 mg/dL — ABNORMAL HIGH (ref 70–99)
Potassium: 3.8 mEq/L (ref 3.5–5.1)
Sodium: 132 mEq/L — ABNORMAL LOW (ref 135–145)

## 2022-12-27 DIAGNOSIS — I493 Ventricular premature depolarization: Secondary | ICD-10-CM | POA: Diagnosis not present

## 2022-12-27 DIAGNOSIS — G4733 Obstructive sleep apnea (adult) (pediatric): Secondary | ICD-10-CM | POA: Diagnosis not present

## 2022-12-27 DIAGNOSIS — Z23 Encounter for immunization: Secondary | ICD-10-CM | POA: Diagnosis not present

## 2022-12-27 DIAGNOSIS — E785 Hyperlipidemia, unspecified: Secondary | ICD-10-CM | POA: Diagnosis not present

## 2022-12-27 DIAGNOSIS — I219 Acute myocardial infarction, unspecified: Secondary | ICD-10-CM | POA: Diagnosis not present

## 2022-12-27 DIAGNOSIS — E1169 Type 2 diabetes mellitus with other specified complication: Secondary | ICD-10-CM | POA: Diagnosis not present

## 2022-12-27 DIAGNOSIS — I10 Essential (primary) hypertension: Secondary | ICD-10-CM | POA: Diagnosis not present

## 2022-12-27 DIAGNOSIS — E669 Obesity, unspecified: Secondary | ICD-10-CM | POA: Diagnosis not present

## 2023-01-10 ENCOUNTER — Encounter: Payer: Self-pay | Admitting: Adult Health

## 2023-01-10 ENCOUNTER — Ambulatory Visit: Payer: PPO | Admitting: Adult Health

## 2023-01-10 VITALS — BP 110/68 | HR 75 | Temp 97.3°F | Ht 64.0 in | Wt 170.6 lb

## 2023-01-10 DIAGNOSIS — J4520 Mild intermittent asthma, uncomplicated: Secondary | ICD-10-CM

## 2023-01-10 DIAGNOSIS — J309 Allergic rhinitis, unspecified: Secondary | ICD-10-CM

## 2023-01-10 DIAGNOSIS — G4733 Obstructive sleep apnea (adult) (pediatric): Secondary | ICD-10-CM

## 2023-01-10 DIAGNOSIS — J984 Other disorders of lung: Secondary | ICD-10-CM | POA: Diagnosis not present

## 2023-01-10 MED ORDER — FLUTICASONE FUROATE-VILANTEROL 200-25 MCG/ACT IN AEPB: 1.0000 | INHALATION_SPRAY | Freq: Every day | RESPIRATORY_TRACT | 5 refills | Status: DC

## 2023-01-10 NOTE — Progress Notes (Signed)
@Patient  ID: Kristina Mccoy, female    DOB: January 20, 1956, 67 y.o.   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  Sleep study February 2016 AHI 14.3, AutoPap 5-15.   CT chest March 19, 2022 unchanged subpleural reticular opacities in the right upper lobe most likely from radiation changes.  No suspicious pulmonary nodules, lung RADS 1  01/10/2023 Follow up : COPD with Asthma, OSA , Radiation Fibrosis  Patient returns for follow-up visit.  Last seen June 2023.  Patient has underlying COPD with asthma.  PFTs have showed mild restriction.  Patient was changed from [facility] to [facility] last visit.  Patient says she does not remember if this helped or not.  She says she does get short of breath with prolonged walking or going up stairs.  She has some intermittent cough wheezing that seems to be worse in the spring and fall.  She has to use her albuterol inhaler on occasion.  She does have nasal congestion postnasal drip.  Can only use nasal sprays as she has Sjogren's and cannot take antihistamines. CT chest in June 2023 showed unchanged right upper lobe opacities consistent with radiation changes.  No suspicious nodules.  She has an upcoming lung cancer CT screening in June of this year. ` Patient has history of mild to moderate sleep apnea.  She is on CPAP at bedtime.  Patient says overall she is doing good she feels that she benefits from CPAP with decreased daytime sleepiness. CPAP download  shows excellent compliance with 90% usage.  Daily average usage at 6 hours.  Patient is on CPAP 8 cm H2O.  AHI 0.9.  Minimum leaks.   Allergies  Allergen Reactions   Gabapentin Itching and Swelling   Oxycodone Itching and Rash   Repatha [Evolocumab] Other (See Comments)    abdominal distention, headache, higher blood sugars, facial skin peeling   Carbamazepine Nausea And Vomiting and Other (See Comments)    Abdominal pain    Doxycycline Other (See Comments)   Dulaglutide     Other reaction(s): Other (See Comments) Severe stomach pain   Ezetimibe Other (See Comments)    myalgias   Insulin Glargine Other (See Comments)    BLOATING, MUSCLE/JOINT PAIN   Levaquin [Levofloxacin]     Diarrhea and pain in calf   Metformin     Other reaction(s): Other (See Comments) Cramping- abdominal pain   Methylprednisolone     Reddened face, puffy face    Mirtazapine    Morphine Other (See Comments)   Pregabalin Swelling    Swelling in arms and legs   Rosuvastatin Other (See Comments)    LFTs increased    Venlafaxine Other (See Comments)   Buprenorphine Rash and Swelling   Canagliflozin Other (See Comments) and Rash    YEAST INFECTIONS   Escitalopram Rash   Hydroxychloroquine Rash    Immunization History  Administered Date(s) Administered   PNEUMOCOCCAL CONJUGATE-20 01/29/2021   Pneumococcal Polysaccharide-23 10/07/2008   Tdap 02/24/2020    Past Medical History:  Diagnosis Date  Abdominal pain, epigastric    Abnormal chest CT 09/08/2014   Allergic urticaria    Blood clotting disorder    Breast cancer 01/2013   left breast   Breast cancer 02/2013   Dr Christell Constant ([facility])   Diabetes mellitus    Diverticulosis    Fibromyalgia    GERD (gastroesophageal reflux disease)    Heart murmur    Hyperlipidemia    Lymphedema of arm    right   MI (myocardial infarction)    cardiac MRI 3/2022There is a small subendocardial infarction (< 25% transmural) involving the basal-to-mid  inferolateral wall, which is consistent with the distribution of a posterolateral branch of the RCA or LCx (depending on the pattern of coronary dominance).   Myalgia    Myositis    Neuromyositis    Otitis media, chronic    Pain syndrome, chronic    Peptic ulcer    Sicca syndrome    Sjogren's disease    Sleep apnea    SOB (shortness of breath)    Systemic involvement of connective tissue     Tobacco History: Social History   Tobacco Use  Smoking Status Former   Packs/day: 1.00   Years: 30.00   Additional pack years: 0.00   Total pack years: 30.00   Types: Cigarettes   Quit date: 02/28/2013   Years since quitting: 9.8  Smokeless Tobacco Never   Counseling given: Not Answered   Outpatient Medications Prior to Visit  Medication Sig Dispense Refill   albuterol (VENTOLIN HFA) 108 (90 Base) MCG/ACT inhaler Inhale 2 puffs into the lungs every 4 (four) hours as needed for wheezing or shortness of breath. 18 g 0   ALPRAZolam (XANAX) 0.5 MG tablet TAKE 1 TABLET BY MOUTH 4 TIMES A DAY AS NEEDED FOR ANXIETY 120 tablet 1   aspirin EC 81 MG tablet Take 1 tablet (81 mg total) by mouth daily. 90 tablet 3   Biotin 10 MG TABS Take by mouth.     candesartan (ATACAND) 8 MG tablet Take 4 mg by mouth daily.     Cholecalciferol (VITAMIN D3) 1000 units CAPS Take by mouth daily.      Continuous Blood Gluc Sensor (FREESTYLE LIBRE 14 DAY SENSOR) MISC SMARTSIG:1 Each Topical Every 2 Weeks     Cyanocobalamin 1000 MCG CAPS Take by mouth. Takes occassionally     diclofenac Sodium (VOLTAREN) 1 % GEL Apply 1 Application topically 4 (four) times daily.     Emollient (CERAVE) CREA Apply topically daily.      ezetimibe (ZETIA) 10 MG tablet Take 10 mg by mouth daily.     Glucagon 3 MG/DOSE POWD 1 spray into the left nostril every fifteen (15) minutes as needed (low blood sugar requiring assistance).     hydrocortisone 2.5 % cream APPLY TO AFFECTED AREA TWICE A DAY     ibuprofen (ADVIL) 200 MG tablet Take 200  mg by mouth every 6 (six) hours as needed.     insulin aspart (NOVOLOG) 100 UNIT/ML FlexPen Inject 12 Units into the skin 3 (three) times daily with meals. 15 mL 3   Insulin Glargine (BASAGLAR KWIKPEN) 100 UNIT/ML DIAL AND INJECT 20 UNITS DAILY (MAX 20 UNITS DAILY) 30 mL 0   Insulin Pen Needle 32G X 6 MM MISC Use 3 times daily with meals. 1 each 3   ketoconazole (NIZORAL) 2 % cream Apply 1 application topically 2 (two) times daily as needed.  3   ketoconazole (NIZORAL) 2 % shampoo Apply 1 application  topically as needed.  11   Lancets (ONETOUCH DELICA PLUS LANCET30G) MISC 4 (four) times daily.     metoprolol succinate (TOPROL-XL) 25 MG 24 hr tablet Take 1 tablet (25 mg total) by mouth daily. 90 tablet 3   ondansetron (ZOFRAN) 4 MG tablet TAKE 1 TABLET BY MOUTH EVERY 8 HOURS AS NEEDED FOR NAUSEA AND VOMITING 18 tablet 1   ONETOUCH ULTRA test strip 4 (four) times daily. for testing     pantoprazole (PROTONIX) 40 MG tablet TAKE 1 TABLET BY MOUTH EVERY DAY 90 tablet 1   promethazine (PHENERGAN) 12.5 MG tablet Take 1 tablet (12.5 mg total) by mouth every 8 (eight) hours as needed for nausea or vomiting. 20 tablet 0   pseudoephedrine-guaifenesin (MUCINEX D) 60-600 MG 12 hr tablet Take 1 tablet by mouth 2 (two) times daily as needed.      traMADol (ULTRAM) 50 MG tablet TAKE 1 TABLET BY MOUTH EVERY 6 HOURS AS NEEDED. FOR PAIN 28 tablet 0   traZODone (DESYREL) 50 MG tablet Take 2 tablets (100 mg total) by mouth at bedtime as needed for sleep. 60 tablet 3   triamcinolone (NASACORT) 55 MCG/ACT AERO nasal inhaler Place 2 sprays into the nose daily.     ursodiol (ACTIGALL) 500 MG tablet Take 1,000 mg by mouth daily.     No facility-administered medications prior to visit.     Review of Systems:   Constitutional:   No  weight loss, night sweats,  Fevers, chills,  +fatigue, or  lassitude.  HEENT:   No headaches,  Difficulty swallowing,  Tooth/dental problems, or  Sore throat,                No sneezing,  itching, ear ache, nasal congestion, post nasal drip,   CV:  No chest pain,  Orthopnea, PND, swelling in lower extremities, anasarca, dizziness, palpitations, syncope.   GI  No heartburn, indigestion, abdominal pain, nausea, vomiting, diarrhea, change in bowel habits, loss of appetite, bloody stools.   Resp:  No chest wall deformity  Skin: no rash or lesions.  GU: no dysuria, change in color of urine, no urgency or frequency.  No flank pain, no hematuria   MS:  No joint pain or swelling.  No decreased range of motion.  No back pain.    Physical Exam  BP 110/68 (BP Location: Left Arm, Cuff Size: Normal)   Pulse 75   Temp (!) 97.3 F (36.3 C)   Ht  (1.626 m)   Wt 170 lb 9.6 oz (77.4 kg)   SpO2 98%   BMI 29.28 kg/m   GEN: A/Ox3; pleasant , NAD, well nourished    HEENT:  [place]/AT,  NOSE-clear, THROAT-clear, no lesions, no postnasal drip or exudate noted.  Class 2-3 MP airway   NECK:  Supple w/ fair ROM; no JVD; normal carotid impulses w/o bruits; no thyromegaly or nodules palpated; no lymphadenopathy.    RESP  Clear  P & A; w/o, wheezes/ rales/ or rhonchi. no accessory muscle use, no dullness to percussion  CARD:  RRR, no m/r/g, no peripheral edema, pulses intact, no cyanosis or clubbing.  GI:   Soft & nt; nml bowel sounds; no organomegaly or masses detected.   Musco: Warm bil, no deformities or joint swelling noted.   Neuro: alert, no focal deficits noted.    Skin: Warm, no lesions or rashes    Lab Results:    BMET     Imaging: No results found.  Latest Ref Rng & Units 11/30/2014    2:27 PM  PFT Results  FVC-Pre L 2.33  P  FVC-Predicted Pre % 67  P  FVC-Post L 2.28  P  FVC-Predicted Post % 65  P  Pre FEV1/FVC % % 84  P  Post FEV1/FCV % % 77  P  FEV1-Pre L 1.95  P  FEV1-Predicted Pre % 72  P  FEV1-Post L 1.75  P  DLCO uncorrected ml/min/mmHg 15.45  P  DLCO UNC% % 61  P  DLVA Predicted % 88  P  TLC L 3.94  P  TLC % Predicted % 76  P  RV  % Predicted % 74  P    P Preliminary result    No results found for: "NITRICOXIDE"      Assessment & Plan:   OSA on CPAP Excellent control compliance on nocturnal CPAP.  Continue all current settings  Plan  Patient Instructions  Begin BREO 200 1 puff daily, rinse after use.  Albuterol inhaler As needed   Nasacort AQ 2 puffs daily  Continue on CPAP At bedtime   Keep up the good work  Work on [employer]  Do not drive if sleepy  Follow up in 3 months with Dr. Belia Heman with PFT  and As needed   Please contact office for sooner follow up if symptoms do not improve or worsen or seek emergency care      Restrictive lung disease COPD with asthma and right upper lobe radiation fibrosis changes.  Check PFTs on return visit.  Restart Breo -will increase dose to 200.  Continue on trigger prevention with Nasacort.  Has upcoming CT chest in June.  Plan  Patient Instructions  Begin BREO 200 1 puff daily, rinse after use.  Albuterol inhaler As needed   Nasacort AQ 2 puffs daily  Continue on CPAP At bedtime   Keep up the good work  Work on [employer]  Do not drive if sleepy  Follow up in 3 months with Dr. Belia Heman with PFT  and As needed   Please contact office for sooner follow up if symptoms do not improve or worsen or seek emergency care      Allergic rhinitis Continue on Nasacort daily     Rubye Oaks, NP 01/10/2023

## 2023-01-10 NOTE — Assessment & Plan Note (Signed)
COPD with asthma and right upper lobe radiation fibrosis changes.  Check PFTs on return visit.  Restart Breo -will increase dose to 200.  Continue on trigger prevention with Nasacort.  Has upcoming CT chest in June.  Plan  Patient Instructions  Begin BREO 200 1 puff daily, rinse after use.  Albuterol inhaler As needed   Nasacort AQ 2 puffs daily  Continue on CPAP At bedtime   Keep up the good work  Work on [employer]  Do not drive if sleepy  Follow up in 3 months with Dr. Belia Heman with PFT  and As needed   Please contact office for sooner follow up if symptoms do not improve or worsen or seek emergency care

## 2023-01-10 NOTE — Assessment & Plan Note (Signed)
Excellent control compliance on nocturnal CPAP.  Continue all current settings  Plan  Patient Instructions  Begin BREO 200 1 puff daily, rinse after use.  Albuterol inhaler As needed   Nasacort AQ 2 puffs daily  Continue on CPAP At bedtime   Keep up the good work  Work on [employer]  Do not drive if sleepy  Follow up in 3 months with Dr. Belia Heman with PFT  and As needed   Please contact office for sooner follow up if symptoms do not improve or worsen or seek emergency care

## 2023-01-10 NOTE — Assessment & Plan Note (Signed)
Continue on Nasacort daily

## 2023-01-10 NOTE — Patient Instructions (Addendum)
Begin BREO 200 1 puff daily, rinse after use.  Albuterol inhaler As needed   Nasacort AQ 2 puffs daily  Continue on CPAP At bedtime   Keep up the good work  Work on [employer]  Do not drive if sleepy  Follow up in 3 months with Dr. Belia Heman with PFT  and As needed   Please contact office for sooner follow up if symptoms do not improve or worsen or seek emergency care

## 2023-01-13 DIAGNOSIS — Z794 Long term (current) use of insulin: Secondary | ICD-10-CM | POA: Diagnosis not present

## 2023-01-13 DIAGNOSIS — E1310 Other specified diabetes mellitus with ketoacidosis without coma: Secondary | ICD-10-CM | POA: Diagnosis not present

## 2023-01-13 DIAGNOSIS — E785 Hyperlipidemia, unspecified: Secondary | ICD-10-CM | POA: Diagnosis not present

## 2023-01-13 DIAGNOSIS — E109 Type 1 diabetes mellitus without complications: Secondary | ICD-10-CM | POA: Diagnosis not present

## 2023-01-13 DIAGNOSIS — E1110 Type 2 diabetes mellitus with ketoacidosis without coma: Secondary | ICD-10-CM | POA: Diagnosis not present

## 2023-01-13 DIAGNOSIS — E1169 Type 2 diabetes mellitus with other specified complication: Secondary | ICD-10-CM | POA: Diagnosis not present

## 2023-01-13 LAB — MICROALBUMIN, URINE: Microalb, Ur: <0.3

## 2023-01-13 LAB — MICROALBUMIN / CREATININE URINE RATIO: Microalb Creat Ratio: 28.7

## 2023-01-13 LAB — HEMOGLOBIN A1C: Hemoglobin A1C: 7.9

## 2023-01-17 DIAGNOSIS — L218 Other seborrheic dermatitis: Secondary | ICD-10-CM | POA: Diagnosis not present

## 2023-01-23 ENCOUNTER — Telehealth: Payer: Self-pay | Admitting: Pharmacist

## 2023-01-23 NOTE — Progress Notes (Signed)
[facility] Customer service manager [facility])                                            [facility] Quality Pharmacy Team    01/23/2023  Kristina Mccoy [birth year] [id number]  Returned phone call to patient. HIPAA identifiers confirmed. Patient states that her endocrinologist, Dr. Noelle Penner with [facility] has started her on Rybelsus. She inquires if she would qualify for patient assistance through [employer]. I have advised patient that she was approved for [employer] through her PCP and would likely need to start a new application through her Endocrinologist office. Will place call to office for patient to have this started. Will also follow up with [facility] on patient's behalf for status of PA and first fill of Rybelsus 3 mg.   Left voice with [facility] for a callback. Placed call with [facility]. PA approved and processed for $47.   Will notify patient.   Reynold Bowen, PharmD [facility]  [facility] Clinical Pharmacist Direct Dial: [phone]

## 2023-01-31 ENCOUNTER — Telehealth: Payer: Self-pay

## 2023-01-31 NOTE — Telephone Encounter (Signed)
Called Patient to let her know that we received 4 boxes of Novofone 32 G needles and 8 boxes of Novolog flexpens from Patient Assistance that needs to be picked up. Novolog inject 12 units into the skin 3 times daily with meals

## 2023-02-03 ENCOUNTER — Other Ambulatory Visit: Payer: Self-pay | Admitting: Family Medicine

## 2023-02-03 DIAGNOSIS — G4700 Insomnia, unspecified: Secondary | ICD-10-CM

## 2023-02-05 ENCOUNTER — Other Ambulatory Visit: Payer: Self-pay | Admitting: Family Medicine

## 2023-02-05 DIAGNOSIS — F32A Depression, unspecified: Secondary | ICD-10-CM

## 2023-02-07 NOTE — Telephone Encounter (Signed)
Patient came by and picked up Patient assistance Novofine needles and Novolog pens

## 2023-02-20 DIAGNOSIS — R110 Nausea: Secondary | ICD-10-CM | POA: Diagnosis not present

## 2023-02-20 DIAGNOSIS — K8050 Calculus of bile duct without cholangitis or cholecystitis without obstruction: Secondary | ICD-10-CM | POA: Diagnosis not present

## 2023-02-25 ENCOUNTER — Other Ambulatory Visit: Payer: Self-pay | Admitting: Gastroenterology

## 2023-02-25 DIAGNOSIS — K8050 Calculus of bile duct without cholangitis or cholecystitis without obstruction: Secondary | ICD-10-CM

## 2023-02-25 DIAGNOSIS — R110 Nausea: Secondary | ICD-10-CM

## 2023-03-04 ENCOUNTER — Ambulatory Visit
Admission: RE | Admit: 2023-03-04 | Discharge: 2023-03-04 | Disposition: A | Discharge status: Home / Self Care | Payer: PPO | Source: Ambulatory Visit | Attending: Gastroenterology | Admitting: Gastroenterology

## 2023-03-04 ENCOUNTER — Encounter
Admission: RE | Admit: 2023-03-04 | Discharge: 2023-03-04 | Disposition: A | Discharge status: Home / Self Care | Payer: PPO | Source: Ambulatory Visit | Attending: Gastroenterology | Admitting: Gastroenterology

## 2023-03-04 ENCOUNTER — Ambulatory Visit: Payer: PPO

## 2023-03-04 DIAGNOSIS — R110 Nausea: Secondary | ICD-10-CM | POA: Diagnosis not present

## 2023-03-04 DIAGNOSIS — K8050 Calculus of bile duct without cholangitis or cholecystitis without obstruction: Secondary | ICD-10-CM | POA: Diagnosis not present

## 2023-03-04 DIAGNOSIS — R1011 Right upper quadrant pain: Secondary | ICD-10-CM | POA: Diagnosis not present

## 2023-03-04 DIAGNOSIS — N2889 Other specified disorders of kidney and ureter: Secondary | ICD-10-CM | POA: Diagnosis not present

## 2023-03-04 MED ORDER — TECHNETIUM TC 99M MEBROFENIN IV KIT
5.0000 | PACK | Freq: Once | INTRAVENOUS | Status: AC | PRN
  Administered 2023-03-04: 5.53 via INTRAVENOUS

## 2023-03-07 ENCOUNTER — Other Ambulatory Visit: Payer: Self-pay | Admitting: Family Medicine

## 2023-03-07 DIAGNOSIS — K219 Gastro-esophageal reflux disease without esophagitis: Secondary | ICD-10-CM

## 2023-03-12 ENCOUNTER — Ambulatory Visit: Payer: PPO | Admitting: Family Medicine

## 2023-03-14 ENCOUNTER — Telehealth: Payer: Self-pay | Admitting: Family Medicine

## 2023-03-14 ENCOUNTER — Ambulatory Visit
Admission: EM | Admit: 2023-03-14 | Discharge: 2023-03-14 | Disposition: A | Discharge status: Home / Self Care | Payer: PPO | Attending: Emergency Medicine | Admitting: Emergency Medicine

## 2023-03-14 ENCOUNTER — Ambulatory Visit (INDEPENDENT_AMBULATORY_CARE_PROVIDER_SITE_OTHER): Payer: PPO

## 2023-03-14 DIAGNOSIS — M4316 Spondylolisthesis, lumbar region: Secondary | ICD-10-CM | POA: Diagnosis not present

## 2023-03-14 DIAGNOSIS — M5442 Lumbago with sciatica, left side: Secondary | ICD-10-CM

## 2023-03-14 DIAGNOSIS — M545 Low back pain, unspecified: Secondary | ICD-10-CM | POA: Diagnosis not present

## 2023-03-14 MED ORDER — BACLOFEN 10 MG PO TABS: 10.0000 mg | ORAL_TABLET | Freq: Three times a day (TID) | ORAL | 0 refills | Status: DC

## 2023-03-14 NOTE — Telephone Encounter (Signed)
No appointments available. Patient transferred to Access Nurse.  Reason for call:Lower back pain, left hip; mobility issues Symptoms: sharp shooting pain  Duration  2 weeks Medications: Last seen for this problem: Seen by:

## 2023-03-14 NOTE — ED Provider Notes (Signed)
[facility]    CSN: [id number] Arrival date & time: 03/14/23  1603      History   Chief Complaint Chief Complaint  Patient presents with   Back Pain    HPI Kristina Mccoy is a 67 y.o. female.   HPI  66 year old female with past medical history significant for GERD, osteoarthritis, type 1 diabetes, hyperlipidemia, OSA on CPAP, and chronic pain presents for evaluation of pain in her low back on the left side that radiates down into her left hip.  She is also complaining of numbness to her left foot and toes.  She states that the pain is worse when she is sitting, standing, or walking.  Nothing alleviates the pain.  She has tried Advil, ice, and heat.  Past Medical History:  Diagnosis Date   Abdominal pain, epigastric    Abnormal chest CT 09/08/2014   Allergic urticaria    Blood clotting disorder (HCC)    Breast cancer (HCC) 01/2013   left breast   Breast cancer (HCC) 02/2013   Dr Christell Constant ([facility])   Diabetes mellitus    Diverticulosis    Fibromyalgia    GERD (gastroesophageal reflux disease)    Heart murmur    Hyperlipidemia    Lymphedema of arm    right   MI (myocardial infarction) (HCC)    cardiac MRI 3/2022There is a small subendocardial infarction (< 25% transmural) involving the basal-to-mid inferolateral wall, which is consistent with the distribution of a posterolateral branch of the RCA or LCx (depending on the pattern of coronary dominance).   Myalgia    Myositis    Neuromyositis    Otitis media, chronic    Pain syndrome, chronic    Peptic ulcer    Sicca syndrome (HCC)    Sjogren's disease (HCC)    Sleep apnea    SOB (shortness of breath)    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HCC)     Patient Active Problem List   Diagnosis Date Noted   AKI (acute kidney injury) (HCC) 12/09/2022   Urine frequency 12/09/2022   Osteoarthritis 07/03/2022   GERD (gastroesophageal reflux disease) 07/03/2022   Oral lesion 07/03/2022    Neurological complaint 07/03/2022   Statin intolerance 02/18/2022   PVC's (premature ventricular contractions) 11/09/2021   Bone lesion 11/09/2021   Hypertension 10/26/2021   Head congestion 08/08/2021   Abnormal SPEP 05/01/2021   Reactive airway disease 01/29/2021   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   Elevated LFTs 08/04/2020   Chronic heart failure with preserved ejection fraction (HFpEF) (HCC) 06/15/2020   Elevated aldolase level 04/28/2020   Non-ischemic cardiomyopathy (HCC) 01/13/2020   History of chest pain 10/20/2019   Former smoker 08/30/2019   Restrictive lung disease 08/30/2019   Foot pain 02/16/2019   Hair loss 08/31/2018   Bilateral leg cramps 05/27/2018   Memory difficulty 04/27/2018   Iliotibial band syndrome 04/27/2018   Respiratory illness 04/27/2018   Allergic rhinitis 01/26/2018   RUQ pain 10/28/2017   Obesity (BMI 30.0-34.9) 10/28/2017   Pain in limb 06/03/2017   Chest pain 01/17/2017   Throat fullness 10/15/2016   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   Rash 08/08/2016   Lower abdominal pain 08/08/2016   Cephalalgia 01/26/2016   Dysuria 12/20/2015   Vitamin D deficiency 12/20/2015   Sjogren's syndrome (HCC) 12/20/2015   Chronic fatigue 12/04/2015   Insomnia 10/19/2015   Chronic bilateral low back pain without sciatica 11/07/2014   OSA on CPAP 10/19/2014   Anxiety and depression 08/16/2014  Allergic urticaria 06/02/2014   Dyspnea 05/04/2014   Vaginal yeast infection 07/06/2013   Type 1 diabetes (HCC) 07/06/2013   Malignant neoplasm of breast (female) (HCC) 02/28/2013   Palpitations 12/01/2012   Hyperlipidemia 12/01/2012   Chronic pain disorder 06/11/2012   Hearing loss 09/16/2011   Sinusitis 06/15/2011   Fibromyalgia 06/15/2011    Past Surgical History:  Procedure Laterality Date   BREAST SURGERY Bilateral    mastectomy   COLONOSCOPY WITH PROPOFOL N/A 04/22/2017   Procedure: COLONOSCOPY WITH PROPOFOL;  Surgeon: Christena Deem, MD;   Location: [facility] ENDOSCOPY;  Service: Endoscopy;  Laterality: N/A;   ESOPHAGOGASTRODUODENOSCOPY (EGD) WITH PROPOFOL N/A 04/22/2017   Procedure: ESOPHAGOGASTRODUODENOSCOPY (EGD) WITH PROPOFOL;  Surgeon: Christena Deem, MD;  Location: [facility] ENDOSCOPY;  Service: Endoscopy;  Laterality: N/A;   MASTECTOMY Bilateral 03/23/13    OB History     Gravida  0   Para  0   Term  0   Preterm  0   AB  0   Living  0      SAB  0   IAB  0   Ectopic  0   Multiple  0   Live Births  0            Home Medications    Prior to Admission medications   Medication Sig Start Date End Date Taking? Authorizing Provider  baclofen (LIORESAL) 10 MG tablet Take 1 tablet (10 mg total) by mouth 3 (three) times daily. 03/14/23  Yes Becky Augusta, NP  albuterol (VENTOLIN HFA) 108 (90 Base) MCG/ACT inhaler Inhale 2 puffs into the lungs every 4 (four) hours as needed for wheezing or shortness of breath. 10/13/19   Mickie Bail, NP  ALPRAZolam Prudy Feeler) 0.5 MG tablet TAKE 1 TABLET BY MOUTH 4 TIMES A DAY AS NEEDED FOR ANXIETY 02/05/23   Glori Luis, MD  aspirin EC 81 MG tablet Take 1 tablet (81 mg total) by mouth daily. 12/23/19   End, Cristal Deer, MD  Biotin 10 MG TABS Take by mouth.    [provider]  candesartan (ATACAND) 8 MG tablet Take 4 mg by mouth daily.    [provider]  Cholecalciferol (VITAMIN D3) 1000 units CAPS Take by mouth daily.     [provider]  Continuous Blood Gluc Sensor (FREESTYLE LIBRE 14 DAY SENSOR) MISC SMARTSIG:1 Each Topical Every 2 Weeks 02/05/20   [provider]  Cyanocobalamin 1000 MCG CAPS Take by mouth. Takes occassionally    [provider]  diclofenac Sodium (VOLTAREN) 1 % GEL Apply 1 Application topically 4 (four) times daily. 01/09/23   [provider]  Emollient (CERAVE) CREA Apply topically daily.     [provider]  ezetimibe (ZETIA) 10 MG tablet Take 10 mg by mouth daily. 11/12/21   [provider]  fluticasone furoate-vilanterol (BREO ELLIPTA) 200-25 MCG/ACT AEPB Inhale 1 puff into the lungs daily. 01/10/23   Parrett, Virgel Bouquet, NP  Glucagon 3 MG/DOSE POWD 1 spray into the left nostril every fifteen (15) minutes as needed (low blood sugar requiring assistance). 12/03/21   [provider]  hydrocortisone 2.5 % cream APPLY TO AFFECTED AREA TWICE A DAY 11/05/21   [provider]  ibuprofen (ADVIL) 200 MG tablet Take 200 mg by mouth every 6 (six) hours as needed.    [provider]  insulin aspart (NOVOLOG) 100 UNIT/ML FlexPen Inject 12 Units into the skin 3 (three) times daily with meals. 07/18/22   Sonnenberg,  Yehuda Mao, MD  Insulin Glargine (BASAGLAR KWIKPEN) 100 UNIT/ML DIAL AND INJECT 20 UNITS DAILY (MAX 20 UNITS DAILY) 07/08/22   Glori Luis, MD  Insulin Pen Needle 32G X 6 MM MISC Use 3 times daily with meals. 01/10/22   Glori Luis, MD  ketoconazole (NIZORAL) 2 % cream Apply 1 application topically 2 (two) times daily as needed. 12/25/17   [provider]  ketoconazole (NIZORAL) 2 % shampoo Apply 1 application topically as needed. 12/25/17   [provider]  Lancets (ONETOUCH DELICA PLUS LANCET30G) MISC 4 (four) times daily. 09/29/22   [provider]  metoprolol succinate (TOPROL-XL) 25 MG 24 hr tablet Take 1 tablet (25 mg total) by mouth daily. 11/09/21   Glori Luis, MD  ondansetron (ZOFRAN) 4 MG tablet TAKE 1 TABLET BY MOUTH EVERY 8 HOURS AS NEEDED FOR NAUSEA AND VOMITING 08/09/19   Glori Luis, MD  [facility] ULTRA test strip 4 (four) times daily. for testing 09/14/22   [provider]  pantoprazole (PROTONIX) 40 MG tablet TAKE 1 TABLET BY MOUTH EVERY DAY 03/07/23   Glori Luis, MD  promethazine (PHENERGAN) 12.5 MG tablet Take 1 tablet (12.5 mg total) by mouth every 8 (eight) hours as needed for nausea or vomiting. 03/25/22   Glori Luis, MD  pseudoephedrine-guaifenesin (MUCINEX D) 60-600  MG 12 hr tablet Take 1 tablet by mouth 2 (two) times daily as needed.     [provider]  traMADol (ULTRAM) 50 MG tablet TAKE 1 TABLET BY MOUTH EVERY 6 HOURS AS NEEDED. FOR PAIN 09/02/22   Glori Luis, MD  traZODone (DESYREL) 50 MG tablet TAKE 2 TABLETS (100 MG TOTAL) BY MOUTH AT BEDTIME AS NEEDED FOR SLEEP. 02/03/23   Glori Luis, MD  triamcinolone (NASACORT) 55 MCG/ACT AERO nasal inhaler Place 2 sprays into the nose daily.    [provider]  ursodiol (ACTIGALL) 500 MG tablet Take 1,000 mg by mouth daily. 11/13/20   [provider]    Family History Family History  Problem Relation Age of Onset   Diabetes Mother    Heart disease Mother    Hyperlipidemia Mother    Heart failure Mother    Diabetes Other    Pancreatic cancer Father    Heart disease Brother        Aortic valve disease   Hyperlipidemia Brother    Colon polyps Brother    Stroke Maternal Grandmother    Breast cancer Maternal Aunt    Colon cancer Paternal Grandmother    Rheum arthritis Sister    Psoriasis Other        psoriatic arthritis    Heart disease Brother 51       Tachycardia   Heart disease Brother    Ovarian cancer Neg Hx     Social History Social History   Tobacco Use   Smoking status: Former    Packs/day: 1.00    Years: 30.00    Additional pack years: 0.00    Total pack years: 30.00    Types: Cigarettes    Quit date: 02/28/2013    Years since quitting: 10.0   Smokeless tobacco: Never  Vaping Use   Vaping Use: Never used  Substance Use Topics   Alcohol use: No    Alcohol/week: 0.0 standard drinks of alcohol   Drug use: No     Allergies   Gabapentin, Oxycodone, Repatha [evolocumab], Carbamazepine, Doxycycline, Dulaglutide, Ezetimibe, Insulin glargine, Levaquin [levofloxacin], Metformin, Methylprednisolone, Mirtazapine,  Morphine, Pregabalin, Rosuvastatin, Venlafaxine, Buprenorphine, Canagliflozin, Escitalopram, and Hydroxychloroquine   Review of  Systems Review of Systems  Musculoskeletal:  Positive for back pain. Negative for arthralgias.  Neurological:  Positive for numbness. Negative for weakness.     Physical Exam Triage Vital Signs ED Triage Vitals  Enc Vitals Group     BP 03/14/23 1615 (!) 160/79     Pulse Rate 03/14/23 1615 67     Resp 03/14/23 1615 18     Temp 03/14/23 1615 98 F (36.7 C)     Temp Source 03/14/23 1615 Oral     SpO2 03/14/23 1615 97 %     Weight --      Height --      Head Circumference --      Peak Flow --      Pain Score 03/14/23 1616 9     Pain Loc --      Pain Edu? --      Excl. in GC? --    No data found.  Updated Vital Signs BP (!) 160/79 (BP Location: Left Arm)   Pulse 67   Temp 98 F (36.7 C) (Oral)   Resp 18   SpO2 97%   Visual Acuity Right Eye Distance:   Left Eye Distance:   Bilateral Distance:    Right Eye Near:   Left Eye Near:    Bilateral Near:     Physical Exam Vitals and nursing note reviewed.  Constitutional:      Appearance: Normal appearance. She is not ill-appearing.  HENT:     Head: Normocephalic and atraumatic.  Musculoskeletal:        General: Tenderness present. No signs of injury.  Skin:    General: Skin is warm and dry.     Capillary Refill: Capillary refill takes less than 2 seconds.     Findings: No bruising or erythema.  Neurological:     General: No focal deficit present.     Mental Status: She is alert and oriented to person, place, and time.      UC Treatments / Results  Labs (all labs ordered are listed, but only abnormal results are displayed) Labs Reviewed - No data to display  EKG   Radiology No results found.  Procedures Procedures (including critical care time)  Medications Ordered in UC Medications - No data to display  Initial Impression / Assessment and Plan / UC Course  I have reviewed the triage vital signs and the nursing notes.  Pertinent labs & imaging results that were available during my care of the  patient were reviewed by me and considered in my medical decision making (see chart for details).   Patient is a nontoxic-appearing 67 year old female presenting for evaluation of pain in her left low back as outlined HPI above.  On exam patient has no midline spinous process tenderness or step-off.  She does have tenderness with palpation of the left lumbar paraspinous region.  Her right paraspinous region is unremarkable.  She does have some mild tenderness with palpation along the path of her sciatic nerve in both her left and right buttock.  Bilateral lower extremity strength is 4/5 and her DTRs are 1+ globally.  She denies any saddle anesthesia or urinary or bowel incontinence.  She does report that the pain increases with rotation and I suspect that she has inflammation of the muscles of her lumbar spine.  I will obtain radiographs of her lumbar vertebrae to rule out any bony abnormality or  obvious disc space disease.  Lumbar spine films independently reviewed and evaluated by me.  Impression: Patient does have spurring to the anterior segments of L 1, L2, L4, and L5.  There is significant anterior spurring to the inferior endplate of T11 and the superior endplate of T12.  The spaces appear well-maintained.  Radiology overread is pending.  I will discharge patient on the diagnosis of musculoskeletal low back pain.  She has been using ibuprofen and Aleve which I will continue to encourage her to do.  I will add baclofen 10 mg every 8 hours to help with muscle spasm along with home physical therapy exercises.   Final Clinical Impressions(s) / UC Diagnoses   Final diagnoses:  Acute left-sided low back pain with left-sided sciatica     Discharge Instructions      Take ibuprofen, 600 mg every 6 hours with food, on a schedule for the next 48 hours and then as needed.  Take the baclofen, 10 mg every 8 hours, on a schedule for the next 48 hours and then as needed.  Apply moist heat to your back  for 30 minutes at a time 2-3 times a day to improve blood flow to the area and help remove the lactic acid causing the spasm.  Follow the back exercises given at discharge.  Return for reevaluation for any new or worsening symptoms.      ED Prescriptions     Medication Sig Dispense Auth. Provider   baclofen (LIORESAL) 10 MG tablet Take 1 tablet (10 mg total) by mouth 3 (three) times daily. 30 each Becky Augusta, NP      PDMP not reviewed this encounter.   Becky Augusta, NP 03/14/23 [phone]

## 2023-03-14 NOTE — Telephone Encounter (Signed)
   Duration of pain x 2 weeks

## 2023-03-14 NOTE — ED Triage Notes (Signed)
Patient with c/o left lower back pain and left hip pain. Patient states she has osteoarthritis but this pain is different than her usual pain. States this morning pain was worse and she had some difficulty walking. She has a PCP appt later this month but they told her to come get evaluated due to unavailability.

## 2023-03-14 NOTE — Telephone Encounter (Signed)
Given increased back pain and worsening pain today, needs to be evaluated today. Please call and confirm pt being seen.

## 2023-03-14 NOTE — Discharge Instructions (Addendum)
Take ibuprofen, 600 mg every 6 hours with food, on a schedule for the next 48 hours and then as needed.  Take the baclofen, 10 mg every 8 hours, on a schedule for the next 48 hours and then as needed.  Apply moist heat to your back for 30 minutes at a time 2-3 times a day to improve blood flow to the area and help remove the lactic acid causing the spasm.  Follow the back exercises given at discharge.  Return for reevaluation for any new or worsening symptoms.

## 2023-03-14 NOTE — Telephone Encounter (Signed)
FYI-  Called patient to confirm being seen. Patient having low back pain and left hip pain rated 8/10. She is going to go over to [facility] urgent care to be evaluated.

## 2023-03-15 NOTE — Telephone Encounter (Signed)
FYI.  Kristina Mccoy was seen and evaluated at urgent care.

## 2023-03-21 ENCOUNTER — Ambulatory Visit: Payer: PPO

## 2023-03-24 ENCOUNTER — Encounter: Payer: Self-pay | Admitting: Family Medicine

## 2023-03-24 ENCOUNTER — Ambulatory Visit (INDEPENDENT_AMBULATORY_CARE_PROVIDER_SITE_OTHER): Payer: PPO | Admitting: Family Medicine

## 2023-03-24 VITALS — BP 126/80 | HR 77 | Temp 97.7°F | Ht 64.0 in | Wt 172.0 lb

## 2023-03-24 DIAGNOSIS — R1011 Right upper quadrant pain: Secondary | ICD-10-CM

## 2023-03-24 DIAGNOSIS — M545 Low back pain, unspecified: Secondary | ICD-10-CM

## 2023-03-24 DIAGNOSIS — F32A Depression, unspecified: Secondary | ICD-10-CM | POA: Diagnosis not present

## 2023-03-24 DIAGNOSIS — N289 Disorder of kidney and ureter, unspecified: Secondary | ICD-10-CM | POA: Diagnosis not present

## 2023-03-24 DIAGNOSIS — G4700 Insomnia, unspecified: Secondary | ICD-10-CM | POA: Diagnosis not present

## 2023-03-24 DIAGNOSIS — N2889 Other specified disorders of kidney and ureter: Secondary | ICD-10-CM

## 2023-03-24 DIAGNOSIS — G894 Chronic pain syndrome: Secondary | ICD-10-CM | POA: Diagnosis not present

## 2023-03-24 DIAGNOSIS — M542 Cervicalgia: Secondary | ICD-10-CM

## 2023-03-24 DIAGNOSIS — G8929 Other chronic pain: Secondary | ICD-10-CM

## 2023-03-24 DIAGNOSIS — M5412 Radiculopathy, cervical region: Secondary | ICD-10-CM | POA: Insufficient documentation

## 2023-03-24 DIAGNOSIS — F419 Anxiety disorder, unspecified: Secondary | ICD-10-CM | POA: Diagnosis not present

## 2023-03-24 MED ORDER — METOPROLOL SUCCINATE ER 25 MG PO TB24: 25.0000 mg | ORAL_TABLET | Freq: Every day | ORAL | 3 refills | Status: DC

## 2023-03-24 NOTE — Assessment & Plan Note (Addendum)
Chronic issue.  Still has anxiety though notes it is well-controlled.  She can continue Xanax 0.5 mg 4 times daily as needed for anxiety.  I suspect her subjective heart racing is related to her anxiety.

## 2023-03-24 NOTE — Assessment & Plan Note (Signed)
Chronic issue.  Patient can continue tramadol 50 mg every 6 hours as needed.  She can also continue ibuprofen over-the-counter.

## 2023-03-24 NOTE — Assessment & Plan Note (Signed)
Ongoing issues with this.  Discussed potentially trying prednisone though she notes that it increases her sugar significantly.  Will hold off on that.  We will proceed with cervical MRI for able to get this approved given her reported whole arm numbness.  Discussed the potential for physical therapy once her MRI returns.

## 2023-03-24 NOTE — Assessment & Plan Note (Signed)
Chronic issue.  She will continue to see GI.

## 2023-03-24 NOTE — Progress Notes (Signed)
[name], MD Phone: [phone]  Kristina Mccoy is a 67 y.o. female who presents today for f/u.  Anxiety: Patient notes this is controlled.  Xanax is helpful.  No drowsiness.  She does note at times subjectively feeling her heart racing.  She notes this during her visit though on exam her heart rate was normal and her rhythm was normal.  Sleep difficulty: Patient notes she is only getting 3 to 4 hours of sleep nightly.  Her bedtime varies.  She does watch TV before bed as it does help calm her down as she cannot focus well enough to read before bed.  She has lots going on with trying to move.  She does have 2 cups of coffee and 2 very large containers of tea per day.  She does not have any caffeine after 2 PM.  Trazodone is not helpful so she stopped taking it.  Chronic pain: Patient noted recently being seen for back pain at urgent care.  They advised her to take Advil 600 mg every 6 hours and a muscle relaxer.  She has not really been taking much tramadol.  She notes her back is improved though she does still have chronic back pain.  Left arm pins-and-needles: Patient notes pins-and-needles in her left hand on a daily basis.  At times her whole left arm will go numb for 5 to 10 minutes at a time and this will resolve.  This has been going on for a few months at least.  This happens a few times a week.  She has chronic neuropathy in her feet.  She does report a history of radiculopathy in her arm resulting in significant weakness and having to do physical therapy.  Kidney cyst: Patient had a cyst noted on right upper quadrant ultrasound.  They recommended MRI for further evaluation.  Patient saw GI for right upper quadrant pain.  Continues to have some discomfort in her right upper quadrant pain when she eats.  She has not her neck step in management from GI yet.  Social History   Tobacco Use  Smoking Status Former   Packs/day: 1.00   Years: 30.00   Additional pack years: 0.00    Total pack years: 30.00   Types: Cigarettes   Quit date: 02/28/2013   Years since quitting: 10.0  Smokeless Tobacco Never    Current Outpatient Medications on File Prior to Visit  Medication Sig Dispense Refill   albuterol (VENTOLIN HFA) 108 (90 Base) MCG/ACT inhaler Inhale 2 puffs into the lungs every 4 (four) hours as needed for wheezing or shortness of breath. 18 g 0   ALPRAZolam (XANAX) 0.5 MG tablet TAKE 1 TABLET BY MOUTH 4 TIMES A DAY AS NEEDED FOR ANXIETY 120 tablet 1   aspirin EC 81 MG tablet Take 1 tablet (81 mg total) by mouth daily. 90 tablet 3   baclofen (LIORESAL) 10 MG tablet Take 1 tablet (10 mg total) by mouth 3 (three) times daily. 30 each 0   Biotin 10 MG TABS Take by mouth.     candesartan (ATACAND) 8 MG tablet Take 4 mg by mouth daily.     Cholecalciferol (VITAMIN D3) 1000 units CAPS Take by mouth daily.      Continuous Blood Gluc Sensor (FREESTYLE LIBRE 14 DAY SENSOR) MISC SMARTSIG:1 Each Topical Every 2 Weeks     Cyanocobalamin 1000 MCG CAPS Take by mouth. Takes occassionally     diclofenac Sodium (VOLTAREN) 1 % GEL Apply 1 Application topically 4 (  four) times daily.     Emollient (CERAVE) CREA Apply topically daily.      ezetimibe (ZETIA) 10 MG tablet Take 10 mg by mouth daily.     fluticasone furoate-vilanterol (BREO ELLIPTA) 200-25 MCG/ACT AEPB Inhale 1 puff into the lungs daily. 1 each 5   Glucagon 3 MG/DOSE POWD 1 spray into the left nostril every fifteen (15) minutes as needed (low blood sugar requiring assistance).     hydrocortisone 2.5 % cream APPLY TO AFFECTED AREA TWICE A DAY     ibuprofen (ADVIL) 200 MG tablet Take 200 mg by mouth every 6 (six) hours as needed.     insulin aspart (NOVOLOG) 100 UNIT/ML FlexPen Inject 12 Units into the skin 3 (three) times daily with meals. 15 mL 3   Insulin Glargine (BASAGLAR KWIKPEN) 100 UNIT/ML DIAL AND INJECT 20 UNITS DAILY (MAX 20 UNITS DAILY) 30 mL 0   Insulin Pen Needle 32G X 6 MM MISC Use 3 times daily with meals. 1  each 3   ketoconazole (NIZORAL) 2 % cream Apply 1 application topically 2 (two) times daily as needed.  3   ketoconazole (NIZORAL) 2 % shampoo Apply 1 application topically as needed.  11   Lancets (ONETOUCH DELICA PLUS LANCET30G) MISC 4 (four) times daily.     ondansetron (ZOFRAN) 4 MG tablet TAKE 1 TABLET BY MOUTH EVERY 8 HOURS AS NEEDED FOR NAUSEA AND VOMITING 18 tablet 1   ONETOUCH ULTRA test strip 4 (four) times daily. for testing     pantoprazole (PROTONIX) 40 MG tablet TAKE 1 TABLET BY MOUTH EVERY DAY 90 tablet 1   promethazine (PHENERGAN) 12.5 MG tablet Take 1 tablet (12.5 mg total) by mouth every 8 (eight) hours as needed for nausea or vomiting. 20 tablet 0   pseudoephedrine-guaifenesin (MUCINEX D) 60-600 MG 12 hr tablet Take 1 tablet by mouth 2 (two) times daily as needed.      traMADol (ULTRAM) 50 MG tablet TAKE 1 TABLET BY MOUTH EVERY 6 HOURS AS NEEDED. FOR PAIN 28 tablet 0   traZODone (DESYREL) 50 MG tablet TAKE 2 TABLETS (100 MG TOTAL) BY MOUTH AT BEDTIME AS NEEDED FOR SLEEP. 180 tablet 0   triamcinolone (NASACORT) 55 MCG/ACT AERO nasal inhaler Place 2 sprays into the nose daily.     ursodiol (ACTIGALL) 500 MG tablet Take 1,000 mg by mouth daily.     No current facility-administered medications on file prior to visit.     ROS see history of present illness  Objective  Physical Exam Vitals:   03/24/23 1503  BP: 126/80  Pulse: 77  Temp: 97.7 F (36.5 C)  SpO2: 99%    BP Readings from Last 3 Encounters:  03/24/23 126/80  03/14/23 (!) 160/79  01/10/23 110/68   Wt Readings from Last 3 Encounters:  03/24/23 172 lb (78 kg)  01/10/23 170 lb 9.6 oz (77.4 kg)  12/09/22 168 lb (76.2 kg)    Physical Exam Constitutional:      General: She is not in acute distress.    Appearance: She is not diaphoretic.  Cardiovascular:     Rate and Rhythm: Normal rate and regular rhythm.     Heart sounds: Normal heart sounds.  Pulmonary:     Effort: Pulmonary effort is normal.      Breath sounds: Normal breath sounds.  Skin:    General: Skin is warm and dry.  Neurological:     Mental Status: She is alert.     Comments: 5/5 strength  in bilateral biceps, triceps, grip, quads, hamstrings, plantar and dorsiflexion, sensation to light touch intact in bilateral UE and LE, normal gait      Assessment/Plan: Please see individual problem list.  Anxiety and depression Assessment & Plan: Chronic issue.  Still has anxiety though notes it is well-controlled.  She can continue Xanax 0.5 mg 4 times daily as needed for anxiety.  I suspect her subjective heart racing is related to her anxiety.   Chronic pain disorder Assessment & Plan: Chronic issue.  Patient can continue tramadol 50 mg every 6 hours as needed.  She can also continue ibuprofen over-the-counter.   Insomnia, unspecified type Assessment & Plan: Chronic issue.  Poorly controlled.  She will discontinue trazodone given lack of benefit and side effects.  She noted mental fog after taking it the next morning.  Discussed reduction in caffeine intake and not looking at screens the hour before bed.  Discussed taking to the same bedtime each night.   Chronic bilateral low back pain without sciatica Assessment & Plan: Chronic issue.  Exacerbation recently.  Has started to improve.  Will refer for physical therapy.  Orders: -     Ambulatory referral to Physical Therapy  Cervical radiculopathy Assessment & Plan: Ongoing issues with this.  Discussed potentially trying prednisone though she notes that it increases her sugar significantly.  Will hold off on that.  We will proceed with cervical MRI for able to get this approved given her reported whole arm numbness.  Discussed the potential for physical therapy once her MRI returns.   Renal lesion Assessment & Plan: Renal MRI to be completed.   RUQ pain Assessment & Plan: Chronic issue.  She will continue to see GI.   Other specified disorders of kidney and  ureter -     MR ABDOMEN W WO CONTRAST; Future  Cervicalgia -     MR CERVICAL SPINE WO CONTRAST; Future  Other orders -     Metoprolol Succinate ER; Take 1 tablet (25 mg total) by mouth daily.  Dispense: 90 tablet; Refill: 3     Return in about 3 months (around 06/24/2023).   [name], MD [facility]

## 2023-03-24 NOTE — Assessment & Plan Note (Signed)
Chronic issue.  Exacerbation recently.  Has started to improve.  Will refer for physical therapy.

## 2023-03-24 NOTE — Assessment & Plan Note (Signed)
Chronic issue.  Poorly controlled.  She will discontinue trazodone given lack of benefit and side effects.  She noted mental fog after taking it the next morning.  Discussed reduction in caffeine intake and not looking at screens the hour before bed.  Discussed taking to the same bedtime each night.

## 2023-03-24 NOTE — Assessment & Plan Note (Signed)
Renal MRI to be completed.

## 2023-03-29 ENCOUNTER — Ambulatory Visit: Payer: PPO

## 2023-04-05 ENCOUNTER — Ambulatory Visit
Admission: EM | Admit: 2023-04-05 | Discharge: 2023-04-05 | Disposition: A | Discharge status: Home / Self Care | Payer: PPO

## 2023-04-05 DIAGNOSIS — H1131 Conjunctival hemorrhage, right eye: Secondary | ICD-10-CM

## 2023-04-05 NOTE — ED Provider Notes (Signed)
[facility]    CSN: [id number] Arrival date & time: 04/05/23  0819      History   Chief Complaint Chief Complaint  Patient presents with   Eye Problem    HPI Kristina Mccoy is a 67 y.o. female presenting for concerns about redness to the right lateral eye that she noticed yesterday when she looked in the mirror.  She says that she always has itchy/dry eyes that are little achy related to allergies and Sjogren syndrome.  She also reports occasional headaches and says she takes Advil for that.  Denies any severe headaches.  No loss of vision or change in vision.  Wears glasses.  She denies injury to the eye.  She says she does not recall rubbing her eye or any foreign bodies.  Has been using Pataday eyedrops which she always uses.  Patient says she wanted to be checked to make sure she did not have any signs of infection.  HPI  Past Medical History:  Diagnosis Date   Abdominal pain, epigastric    Abnormal chest CT 09/08/2014   Allergic urticaria    Blood clotting disorder (HCC)    Breast cancer (HCC) 01/2013   left breast   Breast cancer (HCC) 02/2013   Dr Christell Constant ([facility])   Diabetes mellitus    Diverticulosis    Fibromyalgia    GERD (gastroesophageal reflux disease)    Heart murmur    Hyperlipidemia    Lymphedema of arm    right   MI (myocardial infarction) (HCC)    cardiac MRI 3/2022There is a small subendocardial infarction (< 25% transmural) involving the basal-to-mid inferolateral wall, which is consistent with the distribution of a posterolateral branch of the RCA or LCx (depending on the pattern of coronary dominance).   Myalgia    Myositis    Neuromyositis    Otitis media, chronic    Pain syndrome, chronic    Peptic ulcer    Sicca syndrome (HCC)    Sjogren's disease (HCC)    Sleep apnea    SOB (shortness of breath)    Systemic involvement of connective tissue [facility])     Patient Active Problem List   Diagnosis Date Noted   Cervical  radiculopathy 03/24/2023   Renal lesion 03/24/2023   AKI (acute kidney injury) (HCC) 12/09/2022   Urine frequency 12/09/2022   Osteoarthritis 07/03/2022   GERD (gastroesophageal reflux disease) 07/03/2022   Oral lesion 07/03/2022   Neurological complaint 07/03/2022   Statin intolerance 02/18/2022   PVC's (premature ventricular contractions) 11/09/2021   Bone lesion 11/09/2021   Hypertension 10/26/2021   Head congestion 08/08/2021   Abnormal SPEP 05/01/2021   Reactive airway disease 01/29/2021   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   Elevated LFTs 08/04/2020   Chronic heart failure with preserved ejection fraction (HFpEF) (HCC) 06/15/2020   Elevated aldolase level 04/28/2020   Non-ischemic cardiomyopathy (HCC) 01/13/2020   History of chest pain 10/20/2019   Former smoker 08/30/2019   Restrictive lung disease 08/30/2019   Foot pain 02/16/2019   Hair loss 08/31/2018   Bilateral leg cramps 05/27/2018   Memory difficulty 04/27/2018   Iliotibial band syndrome 04/27/2018   Respiratory illness 04/27/2018   Allergic rhinitis 01/26/2018   RUQ pain 10/28/2017   Obesity (BMI 30.0-34.9) 10/28/2017   Pain in limb 06/03/2017   Chest pain 01/17/2017   Throat fullness 10/15/2016   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   Rash 08/08/2016   Lower abdominal pain 08/08/2016   Cephalalgia 01/26/2016  Dysuria 12/20/2015   Vitamin D deficiency 12/20/2015   Sjogren's syndrome (HCC) 12/20/2015   Chronic fatigue 12/04/2015   Insomnia 10/19/2015   Chronic bilateral low back pain without sciatica 11/07/2014   OSA on CPAP 10/19/2014   Anxiety and depression 08/16/2014   Allergic urticaria 06/02/2014   Dyspnea 05/04/2014   Vaginal yeast infection 07/06/2013   Type 1 diabetes (HCC) 07/06/2013   Malignant neoplasm of breast (female) (HCC) 02/28/2013   Hyperlipidemia 12/01/2012   Chronic pain disorder 06/11/2012   Hearing loss 09/16/2011   Sinusitis 06/15/2011   Fibromyalgia 06/15/2011    Past  Surgical History:  Procedure Laterality Date   BREAST SURGERY Bilateral    mastectomy   COLONOSCOPY WITH PROPOFOL N/A 04/22/2017   Procedure: COLONOSCOPY WITH PROPOFOL;  Surgeon: Christena Deem, MD;  Location: [facility] ENDOSCOPY;  Service: Endoscopy;  Laterality: N/A;   ESOPHAGOGASTRODUODENOSCOPY (EGD) WITH PROPOFOL N/A 04/22/2017   Procedure: ESOPHAGOGASTRODUODENOSCOPY (EGD) WITH PROPOFOL;  Surgeon: Christena Deem, MD;  Location: [facility] ENDOSCOPY;  Service: Endoscopy;  Laterality: N/A;   MASTECTOMY Bilateral 03/23/13    OB History     Gravida  0   Para  0   Term  0   Preterm  0   AB  0   Living  0      SAB  0   IAB  0   Ectopic  0   Multiple  0   Live Births  0            Home Medications    Prior to Admission medications   Medication Sig Start Date End Date Taking? Authorizing Provider  albuterol (VENTOLIN HFA) 108 (90 Base) MCG/ACT inhaler Inhale 2 puffs into the lungs every 4 (four) hours as needed for wheezing or shortness of breath. 10/13/19  Yes Mickie Bail, NP  ALPRAZolam Prudy Feeler) 0.5 MG tablet TAKE 1 TABLET BY MOUTH 4 TIMES A DAY AS NEEDED FOR ANXIETY 02/05/23  Yes Glori Luis, MD  aspirin EC 81 MG tablet Take 1 tablet (81 mg total) by mouth daily. 12/23/19  Yes End, Cristal Deer, MD  baclofen (LIORESAL) 10 MG tablet Take 1 tablet (10 mg total) by mouth 3 (three) times daily. 03/14/23  Yes Becky Augusta, NP  Biotin 10 MG TABS Take by mouth.   Yes [provider]  candesartan (ATACAND) 8 MG tablet Take 4 mg by mouth daily.   Yes [provider]  Cholecalciferol (VITAMIN D3) 1000 units CAPS Take by mouth daily.    Yes [provider]  Continuous Blood Gluc Sensor (FREESTYLE LIBRE 14 DAY SENSOR) MISC SMARTSIG:1 Each Topical Every 2 Weeks 02/05/20  Yes [provider]  Cyanocobalamin 1000 MCG CAPS Take by mouth. Takes occassionally   Yes [provider]  diclofenac Sodium (VOLTAREN) 1 % GEL Apply 1 Application  topically 4 (four) times daily. 01/09/23  Yes [provider]  Emollient (CERAVE) CREA Apply topically daily.    Yes [provider]  ezetimibe (ZETIA) 10 MG tablet Take 10 mg by mouth daily. 11/12/21  Yes [provider]  fluticasone furoate-vilanterol (BREO ELLIPTA) 200-25 MCG/ACT AEPB Inhale 1 puff into the lungs daily. 01/10/23  Yes Parrett, Tammy S, NP  Glucagon 3 MG/DOSE POWD 1 spray into the left nostril every fifteen (15) minutes as needed (low blood sugar requiring assistance). 12/03/21  Yes [provider]  hydrocortisone 2.5 % cream APPLY TO AFFECTED AREA TWICE A DAY 11/05/21  Yes [provider]  ibuprofen (ADVIL)  200 MG tablet Take 200 mg by mouth every 6 (six) hours as needed.   Yes [provider]  insulin aspart (NOVOLOG) 100 UNIT/ML FlexPen Inject 12 Units into the skin 3 (three) times daily with meals. 07/18/22  Yes Glori Luis, MD  Insulin Pen Needle 32G X 6 MM MISC Use 3 times daily with meals. 01/10/22  Yes Glori Luis, MD  ketoconazole (NIZORAL) 2 % cream Apply 1 application topically 2 (two) times daily as needed. 12/25/17  Yes [provider]  ketoconazole (NIZORAL) 2 % shampoo Apply 1 application topically as needed. 12/25/17  Yes [provider]  Lancets (ONETOUCH DELICA PLUS LANCET30G) MISC 4 (four) times daily. 09/29/22  Yes [provider]  metoprolol succinate (TOPROL-XL) 25 MG 24 hr tablet Take 1 tablet (25 mg total) by mouth daily. 03/24/23  Yes Glori Luis, MD  ondansetron (ZOFRAN) 4 MG tablet TAKE 1 TABLET BY MOUTH EVERY 8 HOURS AS NEEDED FOR NAUSEA AND VOMITING 08/09/19  Yes Glori Luis, MD  [facility] ULTRA test strip 4 (four) times daily. for testing 09/14/22  Yes [provider]  pantoprazole (PROTONIX) 40 MG tablet TAKE 1 TABLET BY MOUTH EVERY DAY 03/07/23  Yes Glori Luis, MD  promethazine (PHENERGAN) 12.5 MG tablet Take 1 tablet (12.5 mg total) by mouth  every 8 (eight) hours as needed for nausea or vomiting. 03/25/22  Yes Glori Luis, MD  traMADol (ULTRAM) 50 MG tablet TAKE 1 TABLET BY MOUTH EVERY 6 HOURS AS NEEDED. FOR PAIN 09/02/22  Yes Glori Luis, MD  traZODone (DESYREL) 50 MG tablet TAKE 2 TABLETS (100 MG TOTAL) BY MOUTH AT BEDTIME AS NEEDED FOR SLEEP. 02/03/23  Yes Glori Luis, MD  triamcinolone (NASACORT) 55 MCG/ACT AERO nasal inhaler Place 2 sprays into the nose daily.   Yes [provider]  ursodiol (ACTIGALL) 500 MG tablet Take 1,000 mg by mouth daily. 11/13/20  Yes [provider]  Insulin Glargine (BASAGLAR KWIKPEN) 100 UNIT/ML DIAL AND INJECT 20 UNITS DAILY (MAX 20 UNITS DAILY) 07/08/22   Glori Luis, MD  pseudoephedrine-guaifenesin (MUCINEX D) 60-600 MG 12 hr tablet Take 1 tablet by mouth 2 (two) times daily as needed.     [provider]    Family History Family History  Problem Relation Age of Onset   Diabetes Mother    Heart disease Mother    Hyperlipidemia Mother    Heart failure Mother    Diabetes Other    Pancreatic cancer Father    Heart disease Brother        Aortic valve disease   Hyperlipidemia Brother    Colon polyps Brother    Stroke Maternal Grandmother    Breast cancer Maternal Aunt    Colon cancer Paternal Grandmother    Rheum arthritis Sister    Psoriasis Other        psoriatic arthritis    Heart disease Brother 51       Tachycardia   Heart disease Brother    Ovarian cancer Neg Hx     Social History Social History   Tobacco Use   Smoking status: Former    Packs/day: 1.00    Years: 30.00    Additional pack years: 0.00    Total pack years: 30.00    Types: Cigarettes    Quit date: 02/28/2013    Years since quitting: 10.1   Smokeless tobacco: Never  Vaping Use   Vaping Use: Never used  Substance  Use Topics   Alcohol use: No    Alcohol/week: 0.0 standard drinks of alcohol   Drug use: No     Allergies   Gabapentin, Oxycodone, Repatha  [evolocumab], Carbamazepine, Doxycycline, Dulaglutide, Ezetimibe, Insulin glargine, Levaquin [levofloxacin], Metformin, Methylprednisolone, Mirtazapine, Morphine, Pregabalin, Rosuvastatin, Venlafaxine, Buprenorphine, Canagliflozin, Escitalopram, and Hydroxychloroquine   Review of Systems Review of Systems  Constitutional:  Negative for fatigue and fever.  Eyes:  Positive for redness and itching. Negative for photophobia, pain, discharge and visual disturbance.  Respiratory:  Negative for shortness of breath.   Cardiovascular:  Negative for chest pain.  Gastrointestinal:  Negative for nausea and vomiting.  Neurological:  Positive for headaches. Negative for dizziness, syncope, facial asymmetry, weakness, light-headedness and numbness.     Physical Exam Triage Vital Signs ED Triage Vitals  Enc Vitals Group     BP 04/05/23 0837 (!) 152/83     Pulse Rate 04/05/23 0837 68     Resp --      Temp 04/05/23 0837 98.6 F (37 C)     Temp Source 04/05/23 0837 Oral     SpO2 04/05/23 0837 100 %     Weight 04/05/23 0834 170 lb (77.1 kg)     Height 04/05/23 0834 5\' 4"  (1.626 m)     Head Circumference --      Peak Flow --      Pain Score 04/05/23 0833 4     Pain Loc --      Pain Edu? --      Excl. in GC? --    No data found.  Updated Vital Signs BP (!) 152/83 (BP Location: Left Arm)   Pulse 68   Temp 98.6 F (37 C) (Oral)   Ht 5\' 4"  (1.626 m)   Wt 170 lb (77.1 kg)   SpO2 100%   BMI 29.18 kg/m   Visual Acuity Right Eye Distance: 20/30 (pt states has condition where she can not read out of the right eye.) Left Eye Distance: 20/25 Bilateral Distance: 20/20  Physical Exam Vitals and nursing note reviewed.  Constitutional:      General: She is not in acute distress.    Appearance: Normal appearance. She is not ill-appearing or toxic-appearing.  HENT:     Head: Normocephalic and atraumatic.     Nose: Nose normal.     Mouth/Throat:     Mouth: Mucous membranes are moist.      Pharynx: Oropharynx is clear.  Eyes:     General: No scleral icterus.       Right eye: No discharge.        Left eye: No discharge.     Extraocular Movements: Extraocular movements intact.     Conjunctiva/sclera:     Right eye: Hemorrhage (mild. lateral/outer eye) present.     Pupils: Pupils are equal, round, and reactive to light.  Cardiovascular:     Rate and Rhythm: Normal rate and regular rhythm.     Heart sounds: Normal heart sounds.  Pulmonary:     Effort: Pulmonary effort is normal. No respiratory distress.     Breath sounds: Normal breath sounds.  Musculoskeletal:     Cervical back: Neck supple.  Skin:    General: Skin is dry.  Neurological:     General: No focal deficit present.     Mental Status: She is alert and oriented to person, place, and time. Mental status is at baseline.     Cranial Nerves: No cranial nerve deficit.  Motor: No weakness.     Coordination: Coordination normal.     Gait: Gait normal.  Psychiatric:        Mood and Affect: Mood normal.        Behavior: Behavior normal.        Thought Content: Thought content normal.      UC Treatments / Results  Labs (all labs ordered are listed, but only abnormal results are displayed) Labs Reviewed - No data to display  EKG   Radiology No results found.  Procedures Procedures (including critical care time)  Medications Ordered in UC Medications - No data to display  Initial Impression / Assessment and Plan / UC Course  I have reviewed the triage vital signs and the nursing notes.  Pertinent labs & imaging results that were available during my care of the patient were reviewed by me and considered in my medical decision making (see chart for details).   67 year old female presents for redness of the right lateral eye/external eyes since yesterday.  Reports a history of Sjogren's with itchy and dry eyes.  Uses Pataday eyedrops.  Also reports mild headaches and uses Advil.  Denies severe  headache, dizziness, weakness, facial numbness/tingling, vomiting, change in vision, chest pain, breathing difficulty, confusion.  Vision intact.  Patient wears glasses.  On exam she has a very mild small subconjunctival hemorrhage of the right eye affecting only the outer most aspect of the eye.  Remainder of exam normal.  BP is a little elevated at 152/83.  Discussed this with patient.  Advised supportive care at this time.  She says she just wanted to rule out infection.  Advised to continue using eyedrops as needed for Sjogren's eye condition.  Advise going to ED if she has any severe headaches with dizziness, weakness, vision changes, vomiting, etc.   Final Clinical Impressions(s) / UC Diagnoses   Final diagnoses:  Subconjunctival hemorrhage of right eye     Discharge Instructions      -You have a small subconjunctival hemorrhage.  See the handout. - We discussed different causes for subconjunctival hemorrhage which can be spontaneous or related to itching and irritation/rubbing the eye, injury.  This will get better on its own.  It will take a little bit of time for the blood to reabsorb.  May use warm compresses - May continue using eyedrops for Sjogren's. - If you start to have any severe headaches, dizziness, loss of vision in this eye, vomiting, weakness please go to the ER immediately.     ED Prescriptions   None    PDMP not reviewed this encounter.   Shirlee Latch, PA-C 04/05/23 [phone]

## 2023-04-05 NOTE — Discharge Instructions (Addendum)
-  You have a small subconjunctival hemorrhage.  See the handout. - We discussed different causes for subconjunctival hemorrhage which can be spontaneous or related to itching and irritation/rubbing the eye, injury.  This will get better on its own.  It will take a little bit of time for the blood to reabsorb.  May use warm compresses - May continue using eyedrops for Sjogren's. - If you start to have any severe headaches, dizziness, loss of vision in this eye, vomiting, weakness please go to the ER immediately.

## 2023-04-05 NOTE — ED Triage Notes (Signed)
Pt c/o right eye swelling, soreness, and redness x1day  Pt wears glasses  Pt denies scratching her eye and states that she was watching TV and did not notice the eye swelling until she looked into the mirror.   Pt takes advil and is diabetic.  Pt denies any blurring of her vision.   Pt uses Pataday drops for dry eye/

## 2023-04-07 ENCOUNTER — Other Ambulatory Visit: Payer: Self-pay | Admitting: Family Medicine

## 2023-04-07 DIAGNOSIS — F32A Depression, unspecified: Secondary | ICD-10-CM

## 2023-04-14 ENCOUNTER — Ambulatory Visit
Admission: RE | Admit: 2023-04-14 | Discharge: 2023-04-14 | Disposition: A | Discharge status: Home / Self Care | Payer: PPO | Source: Ambulatory Visit | Attending: Family Medicine | Admitting: Family Medicine

## 2023-04-14 DIAGNOSIS — I6782 Cerebral ischemia: Secondary | ICD-10-CM | POA: Diagnosis not present

## 2023-04-14 DIAGNOSIS — N2889 Other specified disorders of kidney and ureter: Secondary | ICD-10-CM | POA: Insufficient documentation

## 2023-04-14 DIAGNOSIS — M542 Cervicalgia: Secondary | ICD-10-CM | POA: Diagnosis not present

## 2023-04-14 DIAGNOSIS — M4802 Spinal stenosis, cervical region: Secondary | ICD-10-CM | POA: Diagnosis not present

## 2023-04-14 DIAGNOSIS — Z0389 Encounter for observation for other suspected diseases and conditions ruled out: Secondary | ICD-10-CM | POA: Diagnosis not present

## 2023-04-14 DIAGNOSIS — M4722 Other spondylosis with radiculopathy, cervical region: Secondary | ICD-10-CM | POA: Diagnosis not present

## 2023-04-14 DIAGNOSIS — M4721 Other spondylosis with radiculopathy, occipito-atlanto-axial region: Secondary | ICD-10-CM | POA: Diagnosis not present

## 2023-04-14 MED ORDER — GADOBUTROL 1 MMOL/ML IV SOLN
10.0000 mL | Freq: Once | INTRAVENOUS | Status: AC | PRN
  Administered 2023-04-14: 7 mL via INTRAVENOUS

## 2023-04-17 ENCOUNTER — Encounter: Payer: Self-pay | Admitting: Family Medicine

## 2023-04-17 NOTE — Telephone Encounter (Signed)
Late entry.  Called patient again to confirm symptoms, etc.  Please call and confirm pt doing ok.

## 2023-04-17 NOTE — Telephone Encounter (Signed)
Called and left message to try and get more information regarding any other symptoms, extent of streaking (arm), etc.  Left message on machine.

## 2023-04-18 ENCOUNTER — Encounter: Payer: Self-pay | Admitting: Family Medicine

## 2023-04-18 NOTE — Telephone Encounter (Signed)
Patient states during MRI;s her abdomen got very warm then that afternoon she got a headache. Tuesday she had GI issues and in the bathroom all day. Red streaks on arm started Tuesday morning and lasted until this morning. Patient states they had issues putting the IV in and hit multiple veins and areas of her arm. Patient's arm is now bruised. Patient would like to know if the contrast could have caused all these symptoms?

## 2023-04-18 NOTE — Telephone Encounter (Signed)
Noted. It is possible the contrast contributed though it could have been something else too.

## 2023-04-18 NOTE — Telephone Encounter (Signed)
Please reach out to the patient again today to get more details on the redness in her arm. Please also let her know that it has been taking 4+ days to get MRI results back recently and we will contact her once we get the results. Thanks.

## 2023-04-22 ENCOUNTER — Other Ambulatory Visit: Payer: Self-pay | Admitting: *Deleted

## 2023-04-22 ENCOUNTER — Ambulatory Visit: Payer: PPO | Attending: Adult Health

## 2023-04-22 DIAGNOSIS — Z122 Encounter for screening for malignant neoplasm of respiratory organs: Secondary | ICD-10-CM

## 2023-04-22 DIAGNOSIS — Z87891 Personal history of nicotine dependence: Secondary | ICD-10-CM

## 2023-04-23 ENCOUNTER — Ambulatory Visit
Admission: RE | Admit: 2023-04-23 | Discharge: 2023-04-23 | Disposition: A | Discharge status: Home / Self Care | Payer: PPO | Source: Ambulatory Visit

## 2023-04-23 ENCOUNTER — Telehealth: Payer: Self-pay

## 2023-04-23 DIAGNOSIS — I2510 Atherosclerotic heart disease of native coronary artery without angina pectoris: Secondary | ICD-10-CM | POA: Diagnosis not present

## 2023-04-23 DIAGNOSIS — Z87891 Personal history of nicotine dependence: Secondary | ICD-10-CM | POA: Diagnosis not present

## 2023-04-23 DIAGNOSIS — M5412 Radiculopathy, cervical region: Secondary | ICD-10-CM

## 2023-04-23 DIAGNOSIS — J439 Emphysema, unspecified: Secondary | ICD-10-CM | POA: Insufficient documentation

## 2023-04-23 DIAGNOSIS — Z122 Encounter for screening for malignant neoplasm of respiratory organs: Secondary | ICD-10-CM | POA: Insufficient documentation

## 2023-04-23 NOTE — Telephone Encounter (Signed)
Pt called back and I read the message to her and she want the referral placed

## 2023-04-23 NOTE — Telephone Encounter (Signed)
Left message to call the office back regarding the MRI results below.

## 2023-04-23 NOTE — Addendum Note (Signed)
Addended by: Birdie Sons, Deijah Spikes G on: 04/23/2023 11:15 AM   Modules accepted: Orders

## 2023-04-23 NOTE — Telephone Encounter (Signed)
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  I would like to refer her to a spine specialist to get their input on treatment options.  I can place that referral once you speak with her.

## 2023-04-23 NOTE — Telephone Encounter (Signed)
Referral placed.

## 2023-04-24 NOTE — Telephone Encounter (Signed)
[employer] and gave them the office fax number.

## 2023-05-01 ENCOUNTER — Ambulatory Visit: Payer: PPO | Admitting: Internal Medicine

## 2023-05-01 ENCOUNTER — Other Ambulatory Visit: Payer: Self-pay

## 2023-05-01 ENCOUNTER — Encounter: Payer: Self-pay | Admitting: Internal Medicine

## 2023-05-01 VITALS — BP 132/66 | HR 60 | Temp 97.7°F | Ht 64.0 in | Wt 171.2 lb

## 2023-05-01 DIAGNOSIS — G4733 Obstructive sleep apnea (adult) (pediatric): Secondary | ICD-10-CM | POA: Diagnosis not present

## 2023-05-01 DIAGNOSIS — J441 Chronic obstructive pulmonary disease with (acute) exacerbation: Secondary | ICD-10-CM

## 2023-05-01 DIAGNOSIS — Z87891 Personal history of nicotine dependence: Secondary | ICD-10-CM

## 2023-05-01 DIAGNOSIS — Z122 Encounter for screening for malignant neoplasm of respiratory organs: Secondary | ICD-10-CM

## 2023-05-01 MED ORDER — ALBUTEROL SULFATE (2.5 MG/3ML) 0.083% IN NEBU: 2.5000 mg | INHALATION_SOLUTION | RESPIRATORY_TRACT | 5 refills | Status: DC | PRN

## 2023-05-01 MED ORDER — AZITHROMYCIN 250 MG PO TABS
ORAL_TABLET | ORAL | 0 refills | Status: DC
Start: 2023-05-01 — End: 2023-05-15

## 2023-05-01 NOTE — Progress Notes (Signed)
[facility]   PFTs and mild restriction, RV 74, DLCO 61. Sleep study February 2016 AHI 14.3, AutoPap 5-15.  **Personally reviewed download data, 30 days as of 01/07/18.  Usage greater than 4 hours is 26/30 days.  Average usage on days used is 5 hours 50 minutes.  Set pressure is 8.  Residual AHI is 1.  This demonstrates very good compliance with excellent control of obstructive sleep apnea. **Sleep study, CPAP titration 04/09/16, CPAP was recommended at a pressure of 8.  **Imaging personally reviewed, 01/16/17; lungs unremarkable. **CBC 12/22/17, absolute eosinophil count 300.   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 Patient has history of breast cancer status post XRT There is no significant changes over the last 3 years  Patient  was switched back to [facility] inhaler therapy     h/o RT breast cancer bilateral mastectomy Surgical Hx:  Past Surgical History:  Procedure Laterality Date   BREAST SURGERY Bilateral    mastectomy   COLONOSCOPY WITH PROPOFOL N/A 04/22/2017   Procedure: COLONOSCOPY WITH PROPOFOL;  Surgeon: Christena Deem, MD;  Location: [facility] ENDOSCOPY;  Service: Endoscopy;  Laterality: N/A;   ESOPHAGOGASTRODUODENOSCOPY (EGD) WITH PROPOFOL N/A 04/22/2017   Procedure: ESOPHAGOGASTRODUODENOSCOPY (EGD) WITH PROPOFOL;  Surgeon: Christena Deem, MD;  Location: [facility] ENDOSCOPY;  Service: Endoscopy;  Laterality: N/A;   MASTECTOMY Bilateral 03/23/13   Family Hx:  Family History  Problem Relation Age of Onset   Diabetes Mother    Heart disease Mother    Hyperlipidemia Mother    Heart failure Mother    Diabetes Other    Pancreatic cancer Father    Heart disease Brother        Aortic valve disease   Hyperlipidemia Brother    Colon polyps Brother    Stroke Maternal Grandmother    Breast cancer Maternal Aunt    Colon cancer Paternal Grandmother    Rheum arthritis Sister    Psoriasis Other        psoriatic arthritis    Heart disease Brother 51       Tachycardia   Heart disease Brother    Ovarian cancer Neg Hx    Social Hx:   Social History   Tobacco Use   Smoking status: Former    Current packs/day: 0.00  Average packs/day: 1 pack/day for 30.0 years (30.0 ttl pk-yrs)    Types: Cigarettes    Start date: 03/01/1983    Quit date: 02/28/2013    Years since quitting: 10.1   Smokeless tobacco: Never  Vaping Use   Vaping status: Never Used  Substance Use Topics   Alcohol use: No    Alcohol/week: 0.0 standard drinks of alcohol   Drug use: No   Medication:    Current Outpatient Medications:    albuterol (VENTOLIN HFA) 108 (90 Base) MCG/ACT inhaler, Inhale 2 puffs into the lungs every 4 (four) hours as needed for wheezing or shortness of breath., Disp: 18 g, Rfl: 0   ALPRAZolam (XANAX) 0.5 MG  tablet, TAKE 1 TABLET BY MOUTH 4 TIMES A DAY AS NEEDED FOR ANXIETY, Disp: 120 tablet, Rfl: 1   aspirin EC 81 MG tablet, Take 1 tablet (81 mg total) by mouth daily., Disp: 90 tablet, Rfl: 3   baclofen (LIORESAL) 10 MG tablet, Take 1 tablet (10 mg total) by mouth 3 (three) times daily., Disp: 30 each, Rfl: 0   Biotin 10 MG TABS, Take by mouth., Disp: , Rfl:    candesartan (ATACAND) 8 MG tablet, Take 4 mg by mouth daily., Disp: , Rfl:    Cholecalciferol (VITAMIN D3) 1000 units CAPS, Take by mouth daily. , Disp: , Rfl:    Continuous Blood Gluc Sensor (FREESTYLE LIBRE 14 DAY SENSOR) MISC, SMARTSIG:1 Each Topical Every 2 Weeks, Disp: , Rfl:    Cyanocobalamin 1000 MCG CAPS, Take by mouth. Takes occassionally, Disp: , Rfl:    diclofenac Sodium (VOLTAREN) 1 % GEL, Apply 1 Application topically 4 (four) times daily., Disp: , Rfl:    Emollient (CERAVE) CREA, Apply topically daily. , Disp: , Rfl:    ezetimibe (ZETIA) 10 MG tablet, Take 10 mg by mouth daily., Disp: , Rfl:    fluticasone furoate-vilanterol (BREO ELLIPTA) 200-25 MCG/ACT AEPB, Inhale 1 puff into the lungs daily., Disp: 1 each, Rfl: 5   Glucagon 3 MG/DOSE POWD, 1 spray into the left nostril every fifteen (15) minutes as needed (low blood sugar requiring assistance)., Disp: , Rfl:    hydrocortisone 2.5 % cream, APPLY TO AFFECTED AREA TWICE A DAY, Disp: , Rfl:    ibuprofen (ADVIL) 200 MG tablet, Take 200 mg by mouth every 6 (six) hours as needed., Disp: , Rfl:    insulin aspart (NOVOLOG) 100 UNIT/ML FlexPen, Inject 12 Units into the skin 3 (three) times daily with meals., Disp: 15 mL, Rfl: 3   Insulin Glargine (BASAGLAR KWIKPEN) 100 UNIT/ML, DIAL AND INJECT 20 UNITS DAILY (MAX 20 UNITS DAILY), Disp: 30 mL, Rfl: 0   Insulin Pen Needle 32G X 6 MM MISC, Use 3 times daily with meals., Disp: 1 each, Rfl: 3   ketoconazole (NIZORAL) 2 % cream, Apply 1 application topically 2 (two) times daily as needed., Disp: , Rfl: 3   ketoconazole (NIZORAL) 2 % shampoo,  Apply 1 application topically as needed., Disp: , Rfl: 11   Lancets (ONETOUCH DELICA PLUS LANCET30G) MISC, 4 (four) times daily., Disp: , Rfl:    metoprolol succinate (TOPROL-XL) 25 MG 24 hr tablet, Take 1 tablet (25 mg total) by mouth daily., Disp: 90 tablet, Rfl: 3   ondansetron (ZOFRAN) 4 MG tablet, TAKE 1 TABLET BY MOUTH EVERY 8 HOURS AS NEEDED FOR NAUSEA AND VOMITING, Disp: 18 tablet, Rfl: 1   ONETOUCH ULTRA test strip, 4 (four) times daily. for testing, Disp: , Rfl:    pantoprazole (PROTONIX)  40 MG tablet, TAKE 1 TABLET BY MOUTH EVERY DAY, Disp: 90 tablet, Rfl: 1   promethazine (PHENERGAN) 12.5 MG tablet, Take 1 tablet (12.5 mg total) by mouth every 8 (eight) hours as needed for nausea or vomiting., Disp: 20 tablet, Rfl: 0   pseudoephedrine-guaifenesin (MUCINEX D) 60-600 MG 12 hr tablet, Take 1 tablet by mouth 2 (two) times daily as needed. , Disp: , Rfl:    traMADol (ULTRAM) 50 MG tablet, TAKE 1 TABLET BY MOUTH EVERY 6 HOURS AS NEEDED. FOR PAIN, Disp: 28 tablet, Rfl: 0   traZODone (DESYREL) 50 MG tablet, TAKE 2 TABLETS (100 MG TOTAL) BY MOUTH AT BEDTIME AS NEEDED FOR SLEEP., Disp: 180 tablet, Rfl: 0   triamcinolone (NASACORT) 55 MCG/ACT AERO nasal inhaler, Place 2 sprays into the nose daily., Disp: , Rfl:    ursodiol (ACTIGALL) 500 MG tablet, Take 1,000 mg by mouth daily., Disp: , Rfl:    Allergies:  Gabapentin, Oxycodone, Repatha [evolocumab], Carbamazepine, Doxycycline, Dulaglutide, Ezetimibe, Insulin glargine, Levaquin [levofloxacin], Metformin, Methylprednisolone, Mirtazapine, Morphine, Pregabalin, Rosuvastatin, Venlafaxine, Buprenorphine, Canagliflozin, Escitalopram, and Hydroxychloroquine   BP 132/66 (BP Location: Left Arm, Cuff Size: Normal)   Pulse 60   Temp 97.7 F (36.5 C) (Temporal)   Ht 5\' 4"  (1.626 m)   Wt 171 lb 3.2 oz (77.7 kg)   SpO2 97%   BMI 29.39 kg/m    Review of Systems: Gen:  Denies  fever, sweats, chills weight loss  HEENT: Denies blurred vision, double  vision, ear pain, eye pain, hearing loss, nose bleeds, sore throat Cardiac:  No dizziness, chest pain or heaviness, chest tightness,edema, No JVD Resp:   No cough, +sputum production, +shortness of breath,+wheezing, -hemoptysis,  Other:  All other systems negative  Physical Examination:   General Appearance: No distress  EYES PERRLA, EOM intact.   NECK Supple, No JVD Pulmonary: normal breath sounds, No wheezing.  CardiovascularNormal S1,S2.  No m/r/g.   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 underlying intermittent reactive airways disease c/w ASTHMA/COPD    ASTHMA/COPD Patient now has switched back to Breo inhaler There is some congestion at this time with sinuses and chest Will prescribe Z-Pak Patient is intolerant of prednisone Will add albuterol nebulizers to her regimen Patient is to avoid triggers at all costs Avoid sick contacts   CT of the chest abnormal radiological finding right upper lobe fibrosis Follow-up CT scans yearly There is no progression of disease over the last several years Follow-up lung cancer screening protocol CT scans revealed in detail with the patient   OSA Patient uses and benefits from CPAP therapy Excellent compliance Continue CPAP as prescribed   Sjogren's syndrome follow-up with [facility]   Cardiac disease and CHF Follow-up with cardiology as scheduled   MEDICATION ADJUSTMENTS/LABS AND TESTS ORDERED: Continue as prescribed Avoid secondhand smoke Avoid SICK contacts Recommend  Masking  when appropriate Recommend Keep up-to-date with vaccinations Will prescribe Z-Pak for chest and sinus congestion Will prescribe albuterol nebulizers every 4 hours as needed Continue Breo as  prescribed Follow-up lung cancer screening protocol annually   Patient satisfied with Plan of action and management. All questions answered  Follow-up in 6 months  Total time spent 42 minutes     Lucie Leather, M.D.  [facility]  Medical Director [facility] Medical Director [facility] Cardio-Pulmonary Department

## 2023-05-01 NOTE — Addendum Note (Signed)
Addended by: Lajoyce Lauber A on: 05/01/2023 02:28 PM   Modules accepted: Orders

## 2023-05-01 NOTE — Patient Instructions (Signed)
Excellent job with CPAP Continue as prescribed  Avoid secondhand smoke Avoid SICK contacts Recommend  Masking  when appropriate Recommend Keep up-to-date with vaccinations  Will prescribe Z-Pak for chest and sinus congestion  Will prescribe albuterol nebulizers every 4 hours as needed  Continue Breo as prescribed  Follow-up lung cancer screening protocol annually

## 2023-05-02 DIAGNOSIS — L218 Other seborrheic dermatitis: Secondary | ICD-10-CM | POA: Diagnosis not present

## 2023-05-02 DIAGNOSIS — L508 Other urticaria: Secondary | ICD-10-CM | POA: Diagnosis not present

## 2023-05-02 DIAGNOSIS — L578 Other skin changes due to chronic exposure to nonionizing radiation: Secondary | ICD-10-CM | POA: Diagnosis not present

## 2023-05-02 DIAGNOSIS — D225 Melanocytic nevi of trunk: Secondary | ICD-10-CM | POA: Diagnosis not present

## 2023-05-02 DIAGNOSIS — D485 Neoplasm of uncertain behavior of skin: Secondary | ICD-10-CM | POA: Diagnosis not present

## 2023-05-02 DIAGNOSIS — J441 Chronic obstructive pulmonary disease with (acute) exacerbation: Secondary | ICD-10-CM | POA: Diagnosis not present

## 2023-05-02 DIAGNOSIS — D0371 Melanoma in situ of right lower limb, including hip: Secondary | ICD-10-CM | POA: Diagnosis not present

## 2023-05-07 ENCOUNTER — Encounter: Payer: Self-pay | Admitting: Family Medicine

## 2023-05-07 MED ORDER — BACLOFEN 10 MG PO TABS: 10.0000 mg | ORAL_TABLET | Freq: Three times a day (TID) | ORAL | 0 refills | Status: DC

## 2023-05-07 NOTE — Progress Notes (Unsigned)
Referring Physician:  Glori Luis, MD [address] [place],  [place] [address]  Primary Physician:  Glori Luis, MD  History of Present Illness: 05/07/2023 Kristina Mccoy is here today with a chief complaint of ***  Neck pain on the right side and center posterior cervical spine, experiencing issues with imbalance, weakness, numbness in fingertips bilaterally, dropping objects, changes in handwriting.  Duration: *** Location: *** Quality: ***pins and needles Severity: *** 10/10 Precipitating: aggravated by ***  Modifying factors: made better by ***nothing? Weakness: *** Timing: ***constant Bowel/Bladder Dysfunction: none  Conservative measures:  Physical therapy: *** has not participated in? Multimodal medical therapy including regular antiinflammatories: *** baclofen, diclofenac gel, ibuprofen, tramadol Injections: *** no epidural steroid injections?  Past Surgery: ***none  Kristina Mccoy has ***no symptoms of cervical myelopathy.  The symptoms are causing a significant impact on the patient's life.   I have utilized the care everywhere function in epic to review the outside records available from external health systems.  Review of Systems:  A 10 point review of systems is negative, except for the pertinent positives and negatives detailed in the HPI.  Past Medical History: Past Medical History:  Diagnosis Date   Abdominal pain, epigastric    Abnormal chest CT 09/08/2014   Allergic urticaria    Blood clotting disorder (HCC)    Breast cancer (HCC) 01/2013   left breast   Breast cancer (HCC) 02/2013   Dr Christell Constant ([facility])   Diabetes mellitus    Diverticulosis    Fibromyalgia    GERD (gastroesophageal reflux disease)    Heart murmur    Hyperlipidemia    Lymphedema of arm    right   MI (myocardial infarction) (HCC)    cardiac MRI 3/2022There is a small subendocardial infarction (< 25% transmural) involving the  basal-to-mid inferolateral wall, which is consistent with the distribution of a posterolateral branch of the RCA or LCx (depending on the pattern of coronary dominance).   Myalgia    Myositis    Neuromyositis    Otitis media, chronic    Pain syndrome, chronic    Peptic ulcer    Sicca syndrome (HCC)    Sjogren's disease (HCC)    Sleep apnea    SOB (shortness of breath)    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HCC)     Past Surgical History: Past Surgical History:  Procedure Laterality Date   BREAST SURGERY Bilateral    mastectomy   COLONOSCOPY WITH PROPOFOL N/A 04/22/2017   Procedure: COLONOSCOPY WITH PROPOFOL;  Surgeon: Christena Deem, MD;  Location: [facility] ENDOSCOPY;  Service: Endoscopy;  Laterality: N/A;   ESOPHAGOGASTRODUODENOSCOPY (EGD) WITH PROPOFOL N/A 04/22/2017   Procedure: ESOPHAGOGASTRODUODENOSCOPY (EGD) WITH PROPOFOL;  Surgeon: Christena Deem, MD;  Location: [facility] ENDOSCOPY;  Service: Endoscopy;  Laterality: N/A;   MASTECTOMY Bilateral 03/23/13    Allergies: Allergies as of 05/15/2023 - Review Complete 05/01/2023  Allergen Reaction Noted   Gabapentin Itching and Swelling 08/14/2011   Oxycodone Itching and Rash 01/08/2016   Repatha [evolocumab] Other (See Comments) 11/27/2020   Carbamazepine Nausea And Vomiting and Other (See Comments) 07/19/2015   Doxycycline Other (See Comments) 07/11/2020   Dulaglutide  07/08/2017   Ezetimibe Other (See Comments) 03/01/2021   Insulin glargine Other (See Comments) 05/15/2016   Levaquin [levofloxacin]  01/01/2019   Metformin  07/08/2017   Methylprednisolone  09/08/2014   Mirtazapine  10/14/2022   Morphine Other (See Comments) 11/25/2014   Pregabalin Swelling 12/13/2013  Rosuvastatin Other (See Comments) 08/15/2020   Venlafaxine Other (See Comments) 04/21/2017   Buprenorphine Rash and Swelling 05/15/2016   Canagliflozin Other (See Comments) and Rash 05/15/2016   Escitalopram Rash 04/27/2018   Hydroxychloroquine Rash  04/21/2017    Medications:  Current Outpatient Medications:    albuterol (PROVENTIL) (2.5 MG/3ML) 0.083% nebulizer solution, Take 3 mLs (2.5 mg total) by nebulization every 4 (four) hours as needed for wheezing or shortness of breath., Disp: 75 mL, Rfl: 5   albuterol (VENTOLIN HFA) 108 (90 Base) MCG/ACT inhaler, Inhale 2 puffs into the lungs every 4 (four) hours as needed for wheezing or shortness of breath., Disp: 18 g, Rfl: 0   ALPRAZolam (XANAX) 0.5 MG tablet, TAKE 1 TABLET BY MOUTH 4 TIMES A DAY AS NEEDED FOR ANXIETY, Disp: 120 tablet, Rfl: 1   aspirin EC 81 MG tablet, Take 1 tablet (81 mg total) by mouth daily., Disp: 90 tablet, Rfl: 3   azithromycin (ZITHROMAX Z-PAK) 250 MG tablet, Take 2 tablets on Day 1 and then 1 tablet daily till gone., Disp: 6 each, Rfl: 0   baclofen (LIORESAL) 10 MG tablet, Take 1 tablet (10 mg total) by mouth 3 (three) times daily., Disp: 30 each, Rfl: 0   Biotin 10 MG TABS, Take by mouth., Disp: , Rfl:    candesartan (ATACAND) 8 MG tablet, Take 4 mg by mouth daily., Disp: , Rfl:    Cholecalciferol (VITAMIN D3) 1000 units CAPS, Take by mouth daily. , Disp: , Rfl:    Continuous Blood Gluc Sensor (FREESTYLE LIBRE 14 DAY SENSOR) MISC, SMARTSIG:1 Each Topical Every 2 Weeks, Disp: , Rfl:    Cyanocobalamin 1000 MCG CAPS, Take by mouth. Takes occassionally, Disp: , Rfl:    diclofenac Sodium (VOLTAREN) 1 % GEL, Apply 1 Application topically 4 (four) times daily., Disp: , Rfl:    Emollient (CERAVE) CREA, Apply topically daily. , Disp: , Rfl:    ezetimibe (ZETIA) 10 MG tablet, Take 10 mg by mouth daily., Disp: , Rfl:    fluticasone furoate-vilanterol (BREO ELLIPTA) 200-25 MCG/ACT AEPB, Inhale 1 puff into the lungs daily., Disp: 1 each, Rfl: 5   Glucagon 3 MG/DOSE POWD, 1 spray into the left nostril every fifteen (15) minutes as needed (low blood sugar requiring assistance)., Disp: , Rfl:    hydrocortisone 2.5 % cream, APPLY TO AFFECTED AREA TWICE A DAY, Disp: , Rfl:     ibuprofen (ADVIL) 200 MG tablet, Take 200 mg by mouth every 6 (six) hours as needed., Disp: , Rfl:    insulin aspart (NOVOLOG) 100 UNIT/ML FlexPen, Inject 12 Units into the skin 3 (three) times daily with meals., Disp: 15 mL, Rfl: 3   Insulin Glargine (BASAGLAR KWIKPEN) 100 UNIT/ML, DIAL AND INJECT 20 UNITS DAILY (MAX 20 UNITS DAILY) (Patient not taking: Reported on 05/01/2023), Disp: 30 mL, Rfl: 0   Insulin Pen Needle 32G X 6 MM MISC, Use 3 times daily with meals., Disp: 1 each, Rfl: 3   ketoconazole (NIZORAL) 2 % cream, Apply 1 application topically 2 (two) times daily as needed., Disp: , Rfl: 3   ketoconazole (NIZORAL) 2 % shampoo, Apply 1 application topically as needed. (Patient not taking: Reported on 05/01/2023), Disp: , Rfl: 11   Lancets (ONETOUCH DELICA PLUS LANCET30G) MISC, 4 (four) times daily., Disp: , Rfl:    metoprolol succinate (TOPROL-XL) 25 MG 24 hr tablet, Take 1 tablet (25 mg total) by mouth daily., Disp: 90 tablet, Rfl: 3   ondansetron (ZOFRAN) 4 MG tablet, TAKE  1 TABLET BY MOUTH EVERY 8 HOURS AS NEEDED FOR NAUSEA AND VOMITING, Disp: 18 tablet, Rfl: 1   ONETOUCH ULTRA test strip, 4 (four) times daily. for testing, Disp: , Rfl:    pantoprazole (PROTONIX) 40 MG tablet, TAKE 1 TABLET BY MOUTH EVERY DAY, Disp: 90 tablet, Rfl: 1   promethazine (PHENERGAN) 12.5 MG tablet, Take 1 tablet (12.5 mg total) by mouth every 8 (eight) hours as needed for nausea or vomiting. (Patient not taking: Reported on 05/01/2023), Disp: 20 tablet, Rfl: 0   pseudoephedrine-guaifenesin (MUCINEX D) 60-600 MG 12 hr tablet, Take 1 tablet by mouth 2 (two) times daily as needed. , Disp: , Rfl:    traMADol (ULTRAM) 50 MG tablet, TAKE 1 TABLET BY MOUTH EVERY 6 HOURS AS NEEDED. FOR PAIN, Disp: 28 tablet, Rfl: 0   traZODone (DESYREL) 50 MG tablet, TAKE 2 TABLETS (100 MG TOTAL) BY MOUTH AT BEDTIME AS NEEDED FOR SLEEP., Disp: 180 tablet, Rfl: 0   triamcinolone (NASACORT) 55 MCG/ACT AERO nasal inhaler, Place 2 sprays into the  nose daily., Disp: , Rfl:    ursodiol (ACTIGALL) 500 MG tablet, Take 1,000 mg by mouth daily., Disp: , Rfl:   Social History: Social History   Tobacco Use   Smoking status: Former    Current packs/day: 0.00    Average packs/day: 1 pack/day for 30.0 years (30.0 ttl pk-yrs)    Types: Cigarettes    Start date: 03/01/1983    Quit date: 02/28/2013    Years since quitting: 10.1   Smokeless tobacco: Never  Vaping Use   Vaping status: Never Used  Substance Use Topics   Alcohol use: No    Alcohol/week: 0.0 standard drinks of alcohol   Drug use: No    Family Medical History: Family History  Problem Relation Age of Onset   Diabetes Mother    Heart disease Mother    Hyperlipidemia Mother    Heart failure Mother    Diabetes Other    Pancreatic cancer Father    Heart disease Brother        Aortic valve disease   Hyperlipidemia Brother    Colon polyps Brother    Stroke Maternal Grandmother    Breast cancer Maternal Aunt    Colon cancer Paternal Grandmother    Rheum arthritis Sister    Psoriasis Other        psoriatic arthritis    Heart disease Brother 51       Tachycardia   Heart disease Brother    Ovarian cancer Neg Hx     Physical Examination: There were no vitals filed for this visit.  General: Patient is in no apparent distress. Attention to examination is appropriate.  Neck:   Supple.  Full range of motion.  Respiratory: Patient is breathing without any difficulty.   NEUROLOGICAL:     Awake, alert, oriented to person, place, and time.  Speech is clear and fluent.   Cranial Nerves: Pupils equal round and reactive to light.  Facial tone is symmetric.  Facial sensation is symmetric. Shoulder shrug is symmetric. Tongue protrusion is midline.  There is no pronator drift.  Strength: Side Biceps Triceps Deltoid Interossei Grip Wrist Ext. Wrist Flex.  R 5 5 5 5 5 5 5   L 5 5 5 5 5 5 5    Side Iliopsoas Quads Hamstring PF DF EHL  R 5 5 5 5 5 5   L 5 5 5 5 5 5    Reflexes  are ***2+ and symmetric at  the biceps, triceps, brachioradialis, patella and achilles.   Hoffman's is absent.   Bilateral upper and lower extremity sensation is intact to light touch.    No evidence of dysmetria noted.  Gait is normal.     Medical Decision Making  Imaging: ***  I have personally reviewed the images and agree with the above interpretation.  Assessment and Plan: Kristina Mccoy is a pleasant 67 y.o. female with ***    Thank you for involving me in the care of this patient.       K. Myer Haff MD, [facility]

## 2023-05-08 ENCOUNTER — Telehealth: Payer: Self-pay

## 2023-05-08 NOTE — Telephone Encounter (Signed)
Patient has 1 big ziploc with 8 boxes of Novolog and 3 boxes of needles and 1 brown paper bag with 1 box of needles.
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
## 2023-05-15 ENCOUNTER — Encounter (INDEPENDENT_AMBULATORY_CARE_PROVIDER_SITE_OTHER): Payer: Self-pay

## 2023-05-15 ENCOUNTER — Encounter: Payer: Self-pay | Admitting: Neurosurgery

## 2023-05-15 ENCOUNTER — Ambulatory Visit (INDEPENDENT_AMBULATORY_CARE_PROVIDER_SITE_OTHER): Payer: PPO | Admitting: Neurosurgery

## 2023-05-15 VITALS — BP 124/72 | Ht 64.0 in | Wt 168.8 lb

## 2023-05-15 DIAGNOSIS — M4316 Spondylolisthesis, lumbar region: Secondary | ICD-10-CM

## 2023-05-15 DIAGNOSIS — M4802 Spinal stenosis, cervical region: Secondary | ICD-10-CM

## 2023-05-15 DIAGNOSIS — G959 Disease of spinal cord, unspecified: Secondary | ICD-10-CM

## 2023-05-15 MED ORDER — BACLOFEN 10 MG PO TABS: 10.0000 mg | ORAL_TABLET | Freq: Three times a day (TID) | ORAL | 1 refills | Status: AC | PRN

## 2023-05-19 ENCOUNTER — Encounter: Payer: Self-pay | Admitting: Family Medicine

## 2023-05-21 NOTE — Telephone Encounter (Signed)
Patient picked up the Patient Assistance Medications: 8 boxes of Novolog and 4 boxes of needles.

## 2023-05-23 DIAGNOSIS — E119 Type 2 diabetes mellitus without complications: Secondary | ICD-10-CM | POA: Diagnosis not present

## 2023-05-23 DIAGNOSIS — M3500 Sicca syndrome, unspecified: Secondary | ICD-10-CM | POA: Diagnosis not present

## 2023-05-23 DIAGNOSIS — Z794 Long term (current) use of insulin: Secondary | ICD-10-CM | POA: Diagnosis not present

## 2023-05-23 DIAGNOSIS — J449 Chronic obstructive pulmonary disease, unspecified: Secondary | ICD-10-CM | POA: Diagnosis not present

## 2023-05-23 DIAGNOSIS — Z7951 Long term (current) use of inhaled steroids: Secondary | ICD-10-CM | POA: Diagnosis not present

## 2023-05-23 DIAGNOSIS — Z87891 Personal history of nicotine dependence: Secondary | ICD-10-CM | POA: Diagnosis not present

## 2023-05-23 DIAGNOSIS — I509 Heart failure, unspecified: Secondary | ICD-10-CM | POA: Diagnosis not present

## 2023-05-23 DIAGNOSIS — Z6828 Body mass index (BMI) 28.0-28.9, adult: Secondary | ICD-10-CM | POA: Diagnosis not present

## 2023-05-23 DIAGNOSIS — R5383 Other fatigue: Secondary | ICD-10-CM | POA: Diagnosis not present

## 2023-05-23 DIAGNOSIS — I110 Hypertensive heart disease with heart failure: Secondary | ICD-10-CM | POA: Diagnosis not present

## 2023-05-26 DIAGNOSIS — E1310 Other specified diabetes mellitus with ketoacidosis without coma: Secondary | ICD-10-CM | POA: Diagnosis not present

## 2023-05-26 DIAGNOSIS — E785 Hyperlipidemia, unspecified: Secondary | ICD-10-CM | POA: Diagnosis not present

## 2023-05-26 DIAGNOSIS — E1169 Type 2 diabetes mellitus with other specified complication: Secondary | ICD-10-CM | POA: Diagnosis not present

## 2023-05-26 DIAGNOSIS — Z794 Long term (current) use of insulin: Secondary | ICD-10-CM | POA: Diagnosis not present

## 2023-05-26 DIAGNOSIS — E1110 Type 2 diabetes mellitus with ketoacidosis without coma: Secondary | ICD-10-CM | POA: Diagnosis not present

## 2023-05-27 ENCOUNTER — Encounter: Payer: Self-pay | Admitting: Family Medicine

## 2023-05-28 ENCOUNTER — Other Ambulatory Visit: Payer: Self-pay | Admitting: Family Medicine

## 2023-05-28 DIAGNOSIS — J441 Chronic obstructive pulmonary disease with (acute) exacerbation: Secondary | ICD-10-CM | POA: Diagnosis not present

## 2023-05-28 DIAGNOSIS — B351 Tinea unguium: Secondary | ICD-10-CM

## 2023-05-28 DIAGNOSIS — G4733 Obstructive sleep apnea (adult) (pediatric): Secondary | ICD-10-CM | POA: Diagnosis not present

## 2023-06-04 DIAGNOSIS — C4371 Malignant melanoma of right lower limb, including hip: Secondary | ICD-10-CM | POA: Diagnosis not present

## 2023-06-04 DIAGNOSIS — D0372 Melanoma in situ of left lower limb, including hip: Secondary | ICD-10-CM | POA: Diagnosis not present

## 2023-06-06 ENCOUNTER — Other Ambulatory Visit: Payer: Self-pay | Admitting: Family Medicine

## 2023-06-06 DIAGNOSIS — F419 Anxiety disorder, unspecified: Secondary | ICD-10-CM

## 2023-06-06 NOTE — Telephone Encounter (Signed)
Refilled: 04/07/2023 Last OV: 03/24/2023 Next OV: 06/25/2023

## 2023-06-07 ENCOUNTER — Telehealth: Payer: Self-pay | Admitting: Neurosurgery

## 2023-06-09 ENCOUNTER — Telehealth: Payer: Self-pay

## 2023-06-09 NOTE — Telephone Encounter (Signed)
He gave her 1 month supply of baclofen on 05/15/23 with a refill. She should still have baclofen until around 07/15/23. Is she out?   Please call her and let me know.

## 2023-06-09 NOTE — Telephone Encounter (Signed)
I spoke with the patient and asked if she needed a refill on Baclofen, she stated she didn't.  She also wanted [place] to be aware that she had tried doing some of the PT exercises at home but it caused her to have a lot of increased pain.   She also had to have surgery on her leg for melanoma and is currently recovering from that. She will keep her follow up as scheduled.

## 2023-06-09 NOTE — Telephone Encounter (Signed)
I called her earlier this morning and she stated that she doesn't need a refill on this so I refused the prescription.

## 2023-06-14 ENCOUNTER — Encounter: Payer: Self-pay | Admitting: Family Medicine

## 2023-06-17 NOTE — Telephone Encounter (Signed)
Noted  

## 2023-06-25 ENCOUNTER — Encounter: Payer: Self-pay | Admitting: Family Medicine

## 2023-06-25 ENCOUNTER — Ambulatory Visit: Payer: PPO | Admitting: Family Medicine

## 2023-06-25 ENCOUNTER — Other Ambulatory Visit: Payer: Self-pay | Admitting: Family Medicine

## 2023-06-25 VITALS — BP 136/76 | HR 79 | Temp 98.2°F | Ht 64.0 in | Wt 167.8 lb

## 2023-06-25 DIAGNOSIS — J45909 Unspecified asthma, uncomplicated: Secondary | ICD-10-CM

## 2023-06-25 DIAGNOSIS — E109 Type 1 diabetes mellitus without complications: Secondary | ICD-10-CM

## 2023-06-25 DIAGNOSIS — F419 Anxiety disorder, unspecified: Secondary | ICD-10-CM

## 2023-06-25 DIAGNOSIS — F32A Depression, unspecified: Secondary | ICD-10-CM

## 2023-06-25 DIAGNOSIS — C439 Malignant melanoma of skin, unspecified: Secondary | ICD-10-CM | POA: Diagnosis not present

## 2023-06-25 LAB — POC COVID19 BINAXNOW: SARS Coronavirus 2 Ag: NEGATIVE

## 2023-06-25 MED ORDER — AIRSUPRA 90-80 MCG/ACT IN AERO: 2.0000 | INHALATION_SPRAY | RESPIRATORY_TRACT | 1 refills | Status: DC | PRN

## 2023-06-25 NOTE — Assessment & Plan Note (Signed)
Patient will continue to follow with dermatology.

## 2023-06-25 NOTE — Telephone Encounter (Signed)
PA needed

## 2023-06-25 NOTE — Assessment & Plan Note (Signed)
Chronic issue.  Patient will continue NovoLog mealtime insulin and sliding scale per endocrinology.

## 2023-06-25 NOTE — Assessment & Plan Note (Addendum)
Chronic issue.  Recent exacerbation.  COVID testing negative.  Patient will continue Breo 1 puff daily.  We will switch rescue inhaler to Airsupra 2 inhalations every 4 hours as needed.  Patient notes she cannot use oral steroids.

## 2023-06-25 NOTE — Assessment & Plan Note (Signed)
Chronic issue.  Generally stable.  She will continue Xanax 0.5 mg 4 times daily as needed for anxiety.  Discussed other sleep medications are not an option given that she is on Xanax.

## 2023-06-25 NOTE — Patient Instructions (Signed)
Nice to see you. We will switch her rescue inhaler to medicine called Airsupra.  You can use this every 4 hours as needed for shortness of breath or wheezing.

## 2023-06-25 NOTE — Progress Notes (Signed)
[name], MD Phone: [phone]  Kristina Mccoy is a 67 y.o. female who presents today for f/u.  Anxiety/depression: Patient notes this is stable.  Xanax is helpful.  She continues to have sleep issues.  She is no longer on trazodone.  She has worked on cutting down on caffeine intake.  No SI.  Diabetes: Notes her glucose averages 170.  She is only on NovoLog at this time.  She notes endocrinology tried Rybelsus though she could not tolerate this.  She was also having lows on the Rybelsus.  She has had no lows since coming off of that.  Melanoma: Patient reports she had surgery on 9/4.  Margins were clear.  She brings in records regarding this for [place] today.  Cough: Patient notes cough, congestion, and wheezing the last few days.  She does use Breo in the morning and uses her albuterol as needed.  Social History   Tobacco Use  Smoking Status Former   Current packs/day: 0.00   Average packs/day: 1 pack/day for 30.0 years (30.0 ttl pk-yrs)   Types: Cigarettes   Start date: 03/01/1983   Quit date: 02/28/2013   Years since quitting: 10.3  Smokeless Tobacco Never    Current Outpatient Medications on File Prior to Visit  Medication Sig Dispense Refill   albuterol (PROVENTIL) (2.5 MG/3ML) 0.083% nebulizer solution Take 3 mLs (2.5 mg total) by nebulization every 4 (four) hours as needed for wheezing or shortness of breath. 75 mL 5   ALPRAZolam (XANAX) 0.5 MG tablet TAKE 1 TABLET BY MOUTH 4 TIMES A DAY AS NEEDED FOR ANXIETY 120 tablet 1   aspirin EC 81 MG tablet Take 1 tablet (81 mg total) by mouth daily. 90 tablet 3   baclofen (LIORESAL) 10 MG tablet Take 1 tablet (10 mg total) by mouth 3 (three) times daily as needed for muscle spasms. 90 tablet 1   Biotin 10 MG TABS Take by mouth.     candesartan (ATACAND) 8 MG tablet Take 4 mg by mouth daily.     Cholecalciferol (VITAMIN D3) 1000 units CAPS Take by mouth daily.      Continuous Blood Gluc Sensor (FREESTYLE LIBRE 14 DAY SENSOR)  MISC SMARTSIG:1 Each Topical Every 2 Weeks     Cyanocobalamin 1000 MCG CAPS Take by mouth. Takes occassionally     diclofenac Sodium (VOLTAREN) 1 % GEL Apply 1 Application topically 4 (four) times daily.     Emollient (CERAVE) CREA Apply topically daily.      ezetimibe (ZETIA) 10 MG tablet Take 10 mg by mouth daily.     fluticasone furoate-vilanterol (BREO ELLIPTA) 200-25 MCG/ACT AEPB Inhale 1 puff into the lungs daily. 1 each 5   Glucagon 3 MG/DOSE POWD 1 spray into the left nostril every fifteen (15) minutes as needed (low blood sugar requiring assistance).     hydrocortisone 2.5 % cream APPLY TO AFFECTED AREA TWICE A DAY     ibuprofen (ADVIL) 200 MG tablet Take 200 mg by mouth every 6 (six) hours as needed.     insulin aspart (NOVOLOG) 100 UNIT/ML FlexPen Inject 12 Units into the skin 3 (three) times daily with meals. 15 mL 3   Insulin Glargine (BASAGLAR KWIKPEN) 100 UNIT/ML DIAL AND INJECT 20 UNITS DAILY (MAX 20 UNITS DAILY) 30 mL 0   Insulin Pen Needle 32G X 6 MM MISC Use 3 times daily with meals. 1 each 3   ketoconazole (NIZORAL) 2 % cream Apply 1 application topically 2 (two) times daily as  needed.  3   ketoconazole (NIZORAL) 2 % shampoo Apply 1 application  topically as needed.  11   Lancets (ONETOUCH DELICA PLUS LANCET30G) MISC 4 (four) times daily.     metoprolol succinate (TOPROL-XL) 25 MG 24 hr tablet Take 1 tablet (25 mg total) by mouth daily. 90 tablet 3   ondansetron (ZOFRAN) 4 MG tablet TAKE 1 TABLET BY MOUTH EVERY 8 HOURS AS NEEDED FOR NAUSEA AND VOMITING 18 tablet 1   ONETOUCH ULTRA test strip 4 (four) times daily. for testing     pantoprazole (PROTONIX) 40 MG tablet TAKE 1 TABLET BY MOUTH EVERY DAY 90 tablet 1   promethazine (PHENERGAN) 12.5 MG tablet Take 1 tablet (12.5 mg total) by mouth every 8 (eight) hours as needed for nausea or vomiting. 20 tablet 0   pseudoephedrine-guaifenesin (MUCINEX D) 60-600 MG 12 hr tablet Take 1 tablet by mouth 2 (two) times daily as needed.       traMADol (ULTRAM) 50 MG tablet TAKE 1 TABLET BY MOUTH EVERY 6 HOURS AS NEEDED. FOR PAIN 28 tablet 0   traZODone (DESYREL) 50 MG tablet TAKE 2 TABLETS (100 MG TOTAL) BY MOUTH AT BEDTIME AS NEEDED FOR SLEEP. 180 tablet 0   triamcinolone (NASACORT) 55 MCG/ACT AERO nasal inhaler Place 2 sprays into the nose daily.     ursodiol (ACTIGALL) 500 MG tablet Take 1,000 mg by mouth daily.     No current facility-administered medications on file prior to visit.     ROS see history of present illness  Objective  Physical Exam Vitals:   06/25/23 1404  BP: 136/76  Pulse: 79  Temp: 98.2 F (36.8 C)  SpO2: 98%    BP Readings from Last 3 Encounters:  06/25/23 136/76  05/15/23 124/72  05/01/23 132/66   Wt Readings from Last 3 Encounters:  06/25/23 167 lb 12.8 oz (76.1 kg)  05/15/23 168 lb 12.8 oz (76.6 kg)  05/01/23 171 lb 3.2 oz (77.7 kg)    Physical Exam Constitutional:      General: She is not in acute distress.    Appearance: She is not diaphoretic.  Cardiovascular:     Rate and Rhythm: Normal rate and regular rhythm.     Heart sounds: Normal heart sounds.  Pulmonary:     Effort: Pulmonary effort is normal.     Breath sounds: Normal breath sounds.  Skin:    General: Skin is warm and dry.  Neurological:     Mental Status: She is alert.      Assessment/Plan: Please see individual problem list.  Type 1 diabetes mellitus without complication (HCC) Assessment & Plan: Chronic issue.  Patient will continue NovoLog mealtime insulin and sliding scale per endocrinology.   Reactive airway disease without complication, unspecified asthma severity, unspecified whether persistent Assessment & Plan: Chronic issue.  Recent exacerbation.  COVID testing negative.  Patient will continue Breo 1 puff daily.  We will switch rescue inhaler to Airsupra 2 inhalations every 4 hours as needed.  Patient notes she cannot use oral steroids.  Orders: -     Airsupra; Inhale 2 puffs into the lungs  every 4 (four) hours as needed (wheezing or shortness of breath).  Dispense: 10.7 g; Refill: 1 -     POC COVID-19 BinaxNow  Anxiety and depression Assessment & Plan: Chronic issue.  Generally stable.  She will continue Xanax 0.5 mg 4 times daily as needed for anxiety.  Discussed other sleep medications are not an option given that she is  on Xanax.   Melanoma of skin [facility]) Assessment & Plan: Patient will continue to follow with dermatology.    Return in about 3 months (around 09/24/2023).   [name], MD [facility]

## 2023-06-27 ENCOUNTER — Telehealth: Payer: Self-pay | Admitting: Family Medicine

## 2023-06-27 ENCOUNTER — Other Ambulatory Visit: Payer: Self-pay | Admitting: Family Medicine

## 2023-06-27 ENCOUNTER — Encounter: Payer: Self-pay | Admitting: Family Medicine

## 2023-06-27 DIAGNOSIS — K219 Gastro-esophageal reflux disease without esophagitis: Secondary | ICD-10-CM

## 2023-06-27 NOTE — Telephone Encounter (Signed)
Please let the patient know the pharmacist sent a message to the group that helps with patient assistance renewals.  Somebody should let her know at some point about the renewal process.

## 2023-06-27 NOTE — Telephone Encounter (Signed)
Called Patient to let her know that someone should let her know about the renewal process for Patient Assistance.

## 2023-06-27 NOTE — Telephone Encounter (Signed)
-----   Message from Loree Fee sent at 06/26/2023  1:18 PM EDT ----- I forwarded your message to the Technician MAP team to inquire about their process for renewals. I can update you if I get a response. Otherwise, I will add this patient to my reminder list to check in toward the end of the year if a MAP Tech has not already.   Thanks! ----- Message ----- From: Glori Luis, MD Sent: 06/25/2023   2:36 PM EDT To: Loree Fee, RPH  This patient is due to reapply for patient assistance for novolog at the end of the year. Do you know what the process is for this? I think in the past there was someone on the pharmacy team that was processing these applications. Possibly it was a Associate Professor. Thanks.

## 2023-06-30 NOTE — Telephone Encounter (Signed)
I called [facility] they said Insurance will not cover this drug and it will be $500. Is there an alternative for the Patient ?

## 2023-07-01 ENCOUNTER — Ambulatory Visit: Payer: PPO | Admitting: Podiatry

## 2023-07-01 ENCOUNTER — Other Ambulatory Visit (HOSPITAL_COMMUNITY): Payer: Self-pay

## 2023-07-01 DIAGNOSIS — L603 Nail dystrophy: Secondary | ICD-10-CM | POA: Diagnosis not present

## 2023-07-01 NOTE — Telephone Encounter (Signed)
Per test claim, Paulene Floor is non formulary and will not be covered by insurance. Please separate into two inhalers (one albuterol, and one budesonide) if clinically appropriate to replace combinaton inhaler, Airsupra, or advise.  Please sign off on rx in this encounter as PA team is unable to resolve RX requests. Thank you

## 2023-07-01 NOTE — Progress Notes (Signed)
Subjective:  Patient ID: Kristina Mccoy, female    DOB: [birth year],  MRN: [id number]  Chief Complaint  Patient presents with   Nail Problem    Right hallux nail     67 y.o. female presents with the above complaint.  Patient presents with right hallux nail dystrophy painful to touch is progressive gotten worse worse with ambulation causes her pain scale is 2 out of 10.  Primarily she wanted discuss treatment options for nail fungus.  She has not seen MRIs prior to seeing me denies any other acute complaints.  She is a diabetic with last A1c of 7.9.   Review of Systems: Negative except as noted in the HPI. Denies N/V/F/Ch.  Past Medical History:  Diagnosis Date   Abdominal pain, epigastric    Abnormal chest CT 09/08/2014   Allergic urticaria    Blood clotting disorder (HCC)    Breast cancer (HCC) 01/2013   left breast   Breast cancer (HCC) 02/2013   Dr Christell Constant ([facility])   Diabetes mellitus    Diverticulosis    Fibromyalgia    GERD (gastroesophageal reflux disease)    Heart murmur    Hyperlipidemia    Lymphedema of arm    right   MI (myocardial infarction) (HCC)    cardiac MRI 3/2022There is a small subendocardial infarction (< 25% transmural) involving the basal-to-mid inferolateral wall, which is consistent with the distribution of a posterolateral branch of the RCA or LCx (depending on the pattern of coronary dominance).   Myalgia    Myositis    Neuromyositis    Otitis media, chronic    Pain syndrome, chronic    Peptic ulcer    Sicca syndrome (HCC)    Sjogren's disease (HCC)    Sleep apnea    SOB (shortness of breath)    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HCC)     Current Outpatient Medications:    albuterol (PROVENTIL) (2.5 MG/3ML) 0.083% nebulizer solution, Take 3 mLs (2.5 mg total) by nebulization every 4 (four) hours as needed for wheezing or shortness of breath., Disp: 75 mL, Rfl: 5   Albuterol-Budesonide (AIRSUPRA) 90-80 MCG/ACT AERO, Inhale 2  puffs into the lungs every 4 (four) hours as needed (wheezing or shortness of breath)., Disp: 10.7 g, Rfl: 1   ALPRAZolam (XANAX) 0.5 MG tablet, TAKE 1 TABLET BY MOUTH 4 TIMES A DAY AS NEEDED FOR ANXIETY, Disp: 120 tablet, Rfl: 1   aspirin EC 81 MG tablet, Take 1 tablet (81 mg total) by mouth daily., Disp: 90 tablet, Rfl: 3   baclofen (LIORESAL) 10 MG tablet, Take 1 tablet (10 mg total) by mouth 3 (three) times daily as needed for muscle spasms., Disp: 90 tablet, Rfl: 1   Biotin 10 MG TABS, Take by mouth., Disp: , Rfl:    candesartan (ATACAND) 8 MG tablet, Take 4 mg by mouth daily., Disp: , Rfl:    Cholecalciferol (VITAMIN D3) 1000 units CAPS, Take by mouth daily. , Disp: , Rfl:    Continuous Blood Gluc Sensor (FREESTYLE LIBRE 14 DAY SENSOR) MISC, SMARTSIG:1 Each Topical Every 2 Weeks, Disp: , Rfl:    Cyanocobalamin 1000 MCG CAPS, Take by mouth. Takes occassionally, Disp: , Rfl:    diclofenac Sodium (VOLTAREN) 1 % GEL, Apply 1 Application topically 4 (four) times daily., Disp: , Rfl:    Emollient (CERAVE) CREA, Apply topically daily. , Disp: , Rfl:    ezetimibe (ZETIA) 10 MG tablet, Take 10 mg by mouth daily., Disp: , Rfl:  fluticasone furoate-vilanterol (BREO ELLIPTA) 200-25 MCG/ACT AEPB, Inhale 1 puff into the lungs daily., Disp: 1 each, Rfl: 5   Glucagon 3 MG/DOSE POWD, 1 spray into the left nostril every fifteen (15) minutes as needed (low blood sugar requiring assistance)., Disp: , Rfl:    hydrocortisone 2.5 % cream, APPLY TO AFFECTED AREA TWICE A DAY, Disp: , Rfl:    ibuprofen (ADVIL) 200 MG tablet, Take 200 mg by mouth every 6 (six) hours as needed., Disp: , Rfl:    insulin aspart (NOVOLOG) 100 UNIT/ML FlexPen, Inject 12 Units into the skin 3 (three) times daily with meals., Disp: 15 mL, Rfl: 3   Insulin Glargine (BASAGLAR KWIKPEN) 100 UNIT/ML, DIAL AND INJECT 20 UNITS DAILY (MAX 20 UNITS DAILY), Disp: 30 mL, Rfl: 0   Insulin Pen Needle 32G X 6 MM MISC, Use 3 times daily with meals., Disp:  1 each, Rfl: 3   ketoconazole (NIZORAL) 2 % cream, Apply 1 application topically 2 (two) times daily as needed., Disp: , Rfl: 3   ketoconazole (NIZORAL) 2 % shampoo, Apply 1 application  topically as needed., Disp: , Rfl: 11   Lancets (ONETOUCH DELICA PLUS LANCET30G) MISC, 4 (four) times daily., Disp: , Rfl:    metoprolol succinate (TOPROL-XL) 25 MG 24 hr tablet, Take 1 tablet (25 mg total) by mouth daily., Disp: 90 tablet, Rfl: 3   ondansetron (ZOFRAN) 4 MG tablet, TAKE 1 TABLET BY MOUTH EVERY 8 HOURS AS NEEDED FOR NAUSEA AND VOMITING, Disp: 18 tablet, Rfl: 1   ONETOUCH ULTRA test strip, 4 (four) times daily. for testing, Disp: , Rfl:    pantoprazole (PROTONIX) 40 MG tablet, TAKE 1 TABLET BY MOUTH EVERY DAY, Disp: 90 tablet, Rfl: 1   promethazine (PHENERGAN) 12.5 MG tablet, Take 1 tablet (12.5 mg total) by mouth every 8 (eight) hours as needed for nausea or vomiting., Disp: 20 tablet, Rfl: 0   pseudoephedrine-guaifenesin (MUCINEX D) 60-600 MG 12 hr tablet, Take 1 tablet by mouth 2 (two) times daily as needed. , Disp: , Rfl:    traMADol (ULTRAM) 50 MG tablet, TAKE 1 TABLET BY MOUTH EVERY 6 HOURS AS NEEDED. FOR PAIN, Disp: 28 tablet, Rfl: 0   traZODone (DESYREL) 50 MG tablet, TAKE 2 TABLETS (100 MG TOTAL) BY MOUTH AT BEDTIME AS NEEDED FOR SLEEP., Disp: 180 tablet, Rfl: 0   triamcinolone (NASACORT) 55 MCG/ACT AERO nasal inhaler, Place 2 sprays into the nose daily., Disp: , Rfl:    ursodiol (ACTIGALL) 500 MG tablet, Take 1,000 mg by mouth daily., Disp: , Rfl:   Social History   Tobacco Use  Smoking Status Former   Current packs/day: 0.00   Average packs/day: 1 pack/day for 30.0 years (30.0 ttl pk-yrs)   Types: Cigarettes   Start date: 03/01/1983   Quit date: 02/28/2013   Years since quitting: 10.3  Smokeless Tobacco Never    Allergies  Allergen Reactions   Gabapentin Itching and Swelling   Oxycodone Itching and Rash   Repatha [Evolocumab] Other (See Comments)    abdominal distention,  headache, higher blood sugars, facial skin peeling   Carbamazepine Nausea And Vomiting and Other (See Comments)    Abdominal pain    Doxycycline Other (See Comments)   Dulaglutide     Other reaction(s): Other (See Comments) Severe stomach pain   Ezetimibe Other (See Comments)    myalgias   Insulin Glargine Other (See Comments)    BLOATING, MUSCLE/JOINT PAIN   Levaquin [Levofloxacin]     Diarrhea and pain  in calf   Metformin     Other reaction(s): Other (See Comments) Cramping- abdominal pain   Methylprednisolone     Reddened face, puffy face    Mirtazapine    Morphine Other (See Comments)   Pregabalin Swelling    Swelling in arms and legs   Rosuvastatin Other (See Comments)    LFTs increased    Venlafaxine Other (See Comments)   Buprenorphine Rash and Swelling   Canagliflozin Other (See Comments) and Rash    YEAST INFECTIONS   Escitalopram Rash   Hydroxychloroquine Rash   Objective:  There were no vitals filed for this visit. There is no height or weight on file to calculate BMI. Constitutional Well developed. Well nourished.  Vascular Dorsalis pedis pulses palpable bilaterally. Posterior tibial pulses palpable bilaterally. Capillary refill normal to all digits.  No cyanosis or clubbing noted. Pedal hair growth normal.  Neurologic Normal speech. Oriented to person, place, and time. Epicritic sensation to light touch grossly present bilaterally.  Dermatologic Thickened elongated dystrophic mycotic toenails x 1 right hallux  Orthopedic: Normal joint ROM without pain or crepitus bilaterally. No visible deformities. No bony tenderness.   Radiographs: None Assessment:   1. Nail dystrophy    Plan:  Patient was evaluated and treated and all questions answered.  Right hallux nail dystrophy -All questions and concerns were discussed with the patient in extensive detail.  I briefly discussed treatment for nail fungus.  Given the amount of damage done to the nail she  will ultimately benefit from a nail removal.  She is a diabetic so she is a high risk of amputation as well if we were to undergo nail removal. -I discussed with patient she states understand would like to think about and get back to me  No follow-ups on file.

## 2023-07-03 NOTE — Telephone Encounter (Signed)
Noted. Patient will stick with albuterol.

## 2023-07-10 DIAGNOSIS — C50111 Malignant neoplasm of central portion of right female breast: Secondary | ICD-10-CM | POA: Diagnosis not present

## 2023-07-10 DIAGNOSIS — Z9013 Acquired absence of bilateral breasts and nipples: Secondary | ICD-10-CM | POA: Diagnosis not present

## 2023-07-10 DIAGNOSIS — C50112 Malignant neoplasm of central portion of left female breast: Secondary | ICD-10-CM | POA: Diagnosis not present

## 2023-07-15 ENCOUNTER — Ambulatory Visit: Payer: PPO | Admitting: Neurosurgery

## 2023-07-15 ENCOUNTER — Encounter: Payer: Self-pay | Admitting: Neurosurgery

## 2023-07-15 VITALS — BP 134/80 | Ht 64.0 in | Wt 166.0 lb

## 2023-07-15 DIAGNOSIS — G959 Disease of spinal cord, unspecified: Secondary | ICD-10-CM

## 2023-07-15 DIAGNOSIS — M4802 Spinal stenosis, cervical region: Secondary | ICD-10-CM | POA: Diagnosis not present

## 2023-07-15 NOTE — Progress Notes (Signed)
Referring Physician:  Glori Luis, MD [address] [place],  [place] [address]  Primary Physician:  Glori Luis, MD  History of Present Illness: 07/15/2023  Since our last visit, Kristina Mccoy has had worsening issues with walking.  She is also having some increasing weakness.  She continues to have some numbness in her hands particularly on the left.  Her weakness is worse on the right.  05/15/2023 Kristina Mccoy is here today with a chief complaint of neck and back pain.  She has neck pain in the center of her spine.  She is also having some trouble with her balance as well as weakness in her hands.  She has numbness in her fingertips at times.  She has been dropping items.  She has noted some changes in her handwriting.  This has been ongoing for 3 months.  All activities make it worse.  Nothing really helps.  She has had some discomfort into her shoulder blades.  In her back, she has pain that is been ongoing for many years.  She has some pain that goes into her buttock and down her right leg.  She has trouble walking long distances.  She has not had any treatment for this.  Bowel/Bladder Dysfunction: none  Conservative measures:  Physical therapy: denies  has not participated  Multimodal medical therapy including regular antiinflammatories:  baclofen, diclofenac gel, ibuprofen, tramadol Injections: denies  no epidural steroid injections  Past Surgery: denies   Berania Peedin Mccoy has mild symptoms of cervical myelopathy.  The symptoms are causing a significant impact on the patient's life.   I have utilized the care everywhere function in epic to review the outside records available from external health systems.  Review of Systems:  A 10 point review of systems is negative, except for the pertinent positives and negatives detailed in the HPI.  Past Medical History: Past Medical History:  Diagnosis Date   Abdominal pain, epigastric    Abnormal  chest CT 09/08/2014   Allergic urticaria    Blood clotting disorder (HCC)    Breast cancer (HCC) 01/2013   left breast   Breast cancer (HCC) 02/2013   Dr Christell Constant ([facility])   Diabetes mellitus    Diverticulosis    Fibromyalgia    GERD (gastroesophageal reflux disease)    Heart murmur    Hyperlipidemia    Lymphedema of arm    right   MI (myocardial infarction) (HCC)    cardiac MRI 3/2022There is a small subendocardial infarction (< 25% transmural) involving the basal-to-mid inferolateral wall, which is consistent with the distribution of a posterolateral branch of the RCA or LCx (depending on the pattern of coronary dominance).   Myalgia    Myositis    Neuromyositis    Otitis media, chronic    Pain syndrome, chronic    Peptic ulcer    Sicca syndrome (HCC)    Sjogren's disease (HCC)    Sleep apnea    SOB (shortness of breath)    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HCC)     Past Surgical History: Past Surgical History:  Procedure Laterality Date   BREAST SURGERY Bilateral    mastectomy   COLONOSCOPY WITH PROPOFOL N/A 04/22/2017   Procedure: COLONOSCOPY WITH PROPOFOL;  Surgeon: Christena Deem, MD;  Location: [facility] ENDOSCOPY;  Service: Endoscopy;  Laterality: N/A;   ESOPHAGOGASTRODUODENOSCOPY (EGD) WITH PROPOFOL N/A 04/22/2017   Procedure: ESOPHAGOGASTRODUODENOSCOPY (EGD) WITH PROPOFOL;  Surgeon: Christena Deem, MD;  Location: [facility] ENDOSCOPY;  Service: Endoscopy;  Laterality: N/A;   MASTECTOMY Bilateral 03/23/13    Allergies: Allergies as of 07/15/2023 - Review Complete 07/15/2023  Allergen Reaction Noted   Gabapentin Itching and Swelling 08/14/2011   Sodium fluoride Rash 07/07/2023   Oxycodone Itching and Rash 01/08/2016   Repatha [evolocumab] Other (See Comments) 11/27/2020   Carbamazepine Nausea And Vomiting and Other (See Comments) 07/19/2015   Doxycycline Other (See Comments) 07/11/2020   Dulaglutide  07/08/2017   Ezetimibe Other (See Comments)  03/01/2021   Insulin glargine Other (See Comments) 05/15/2016   Levaquin [levofloxacin]  01/01/2019   Metformin  07/08/2017   Methylprednisolone  09/08/2014   Mirtazapine  10/14/2022   Morphine Other (See Comments) 11/25/2014   Pregabalin Swelling 12/13/2013   Rosuvastatin Other (See Comments) 08/15/2020   Venlafaxine Other (See Comments) 04/21/2017   Buprenorphine Rash and Swelling 05/15/2016   Canagliflozin Other (See Comments) and Rash 05/15/2016   Escitalopram Rash 04/27/2018   Hydroxychloroquine Rash 04/21/2017    Medications:  Current Outpatient Medications:    albuterol (PROVENTIL) (2.5 MG/3ML) 0.083% nebulizer solution, Take 3 mLs (2.5 mg total) by nebulization every 4 (four) hours as needed for wheezing or shortness of breath., Disp: 75 mL, Rfl: 5   Albuterol-Budesonide (AIRSUPRA) 90-80 MCG/ACT AERO, Inhale 2 puffs into the lungs every 4 (four) hours as needed (wheezing or shortness of breath)., Disp: 10.7 g, Rfl: 1   ALPRAZolam (XANAX) 0.5 MG tablet, TAKE 1 TABLET BY MOUTH 4 TIMES A DAY AS NEEDED FOR ANXIETY, Disp: 120 tablet, Rfl: 1   aspirin EC 81 MG tablet, Take 1 tablet (81 mg total) by mouth daily., Disp: 90 tablet, Rfl: 3   baclofen (LIORESAL) 10 MG tablet, Take 1 tablet (10 mg total) by mouth 3 (three) times daily as needed for muscle spasms., Disp: 90 tablet, Rfl: 1   Biotin 10 MG TABS, Take by mouth., Disp: , Rfl:    candesartan (ATACAND) 8 MG tablet, Take 4 mg by mouth daily., Disp: , Rfl:    Cholecalciferol (VITAMIN D3) 1000 units CAPS, Take by mouth daily. , Disp: , Rfl:    Continuous Blood Gluc Sensor (FREESTYLE LIBRE 14 DAY SENSOR) MISC, SMARTSIG:1 Each Topical Every 2 Weeks, Disp: , Rfl:    Cyanocobalamin 1000 MCG CAPS, Take by mouth. Takes occassionally, Disp: , Rfl:    diclofenac Sodium (VOLTAREN) 1 % GEL, Apply 1 Application topically 4 (four) times daily., Disp: , Rfl:    Emollient (CERAVE) CREA, Apply topically daily. , Disp: , Rfl:    ezetimibe (ZETIA) 10  MG tablet, Take 10 mg by mouth daily., Disp: , Rfl:    fluticasone furoate-vilanterol (BREO ELLIPTA) 200-25 MCG/ACT AEPB, Inhale 1 puff into the lungs daily., Disp: 1 each, Rfl: 5   Glucagon 3 MG/DOSE POWD, 1 spray into the left nostril every fifteen (15) minutes as needed (low blood sugar requiring assistance)., Disp: , Rfl:    hydrocortisone 2.5 % cream, APPLY TO AFFECTED AREA TWICE A DAY, Disp: , Rfl:    ibuprofen (ADVIL) 200 MG tablet, Take 200 mg by mouth every 6 (six) hours as needed., Disp: , Rfl:    insulin aspart (NOVOLOG) 100 UNIT/ML FlexPen, Inject 12 Units into the skin 3 (three) times daily with meals., Disp: 15 mL, Rfl: 3   Insulin Glargine (BASAGLAR KWIKPEN) 100 UNIT/ML, DIAL AND INJECT 20 UNITS DAILY (MAX 20 UNITS DAILY), Disp: 30 mL, Rfl: 0   Insulin Pen Needle 32G X 6 MM MISC, Use 3 times  daily with meals., Disp: 1 each, Rfl: 3   ketoconazole (NIZORAL) 2 % cream, Apply 1 application topically 2 (two) times daily as needed., Disp: , Rfl: 3   ketoconazole (NIZORAL) 2 % shampoo, Apply 1 application  topically as needed., Disp: , Rfl: 11   Lancets (ONETOUCH DELICA PLUS LANCET30G) MISC, 4 (four) times daily., Disp: , Rfl:    metoprolol succinate (TOPROL-XL) 25 MG 24 hr tablet, Take 1 tablet (25 mg total) by mouth daily., Disp: 90 tablet, Rfl: 3   ondansetron (ZOFRAN) 4 MG tablet, TAKE 1 TABLET BY MOUTH EVERY 8 HOURS AS NEEDED FOR NAUSEA AND VOMITING, Disp: 18 tablet, Rfl: 1   ONETOUCH ULTRA test strip, 4 (four) times daily. for testing, Disp: , Rfl:    pantoprazole (PROTONIX) 40 MG tablet, TAKE 1 TABLET BY MOUTH EVERY DAY, Disp: 90 tablet, Rfl: 1   promethazine (PHENERGAN) 12.5 MG tablet, Take 1 tablet (12.5 mg total) by mouth every 8 (eight) hours as needed for nausea or vomiting., Disp: 20 tablet, Rfl: 0   pseudoephedrine-guaifenesin (MUCINEX D) 60-600 MG 12 hr tablet, Take 1 tablet by mouth 2 (two) times daily as needed. , Disp: , Rfl:    traMADol (ULTRAM) 50 MG tablet, TAKE 1 TABLET  BY MOUTH EVERY 6 HOURS AS NEEDED. FOR PAIN, Disp: 28 tablet, Rfl: 0   traZODone (DESYREL) 50 MG tablet, TAKE 2 TABLETS (100 MG TOTAL) BY MOUTH AT BEDTIME AS NEEDED FOR SLEEP., Disp: 180 tablet, Rfl: 0   triamcinolone (NASACORT) 55 MCG/ACT AERO nasal inhaler, Place 2 sprays into the nose daily., Disp: , Rfl:    ursodiol (ACTIGALL) 500 MG tablet, Take 1,000 mg by mouth daily., Disp: , Rfl:   Social History: Social History   Tobacco Use   Smoking status: Former    Current packs/day: 0.00    Average packs/day: 1 pack/day for 30.0 years (30.0 ttl pk-yrs)    Types: Cigarettes    Start date: 03/01/1983    Quit date: 02/28/2013    Years since quitting: 10.3   Smokeless tobacco: Never  Vaping Use   Vaping status: Never Used  Substance Use Topics   Alcohol use: No    Alcohol/week: 0.0 standard drinks of alcohol   Drug use: No    Family Medical History: Family History  Problem Relation Age of Onset   Diabetes Mother    Heart disease Mother    Hyperlipidemia Mother    Heart failure Mother    Diabetes Other    Pancreatic cancer Father    Heart disease Brother        Aortic valve disease   Hyperlipidemia Brother    Colon polyps Brother    Stroke Maternal Grandmother    Breast cancer Maternal Aunt    Colon cancer Paternal Grandmother    Rheum arthritis Sister    Psoriasis Other        psoriatic arthritis    Heart disease Brother 51       Tachycardia   Heart disease Brother    Ovarian cancer Neg Hx     Physical Examination: Vitals:   07/15/23 1436  BP: 134/80    General: Patient is in no apparent distress. Attention to examination is appropriate.  Neck:   Supple.  Full range of motion.  Respiratory: Patient is breathing without any difficulty.   NEUROLOGICAL:     Awake, alert, oriented to person, place, and time.  Speech is clear and fluent.   Cranial Nerves: Pupils equal round and  reactive to light.  Facial tone is symmetric.  Facial sensation is symmetric. Shoulder  shrug is symmetric. Tongue protrusion is midline.  There is no pronator drift.  Strength: Side Biceps Triceps Deltoid Interossei Grip Wrist Ext. Wrist Flex.  R 5 4+ 4 5 4  4+ 5  L 5 5 4+ 5 4+ 5 5   Side Iliopsoas Quads Hamstring PF DF EHL  R 5 5 5 5 5 5   L 5 5 5 5 5 5    Reflexes are 2+ and symmetric at the biceps, triceps, brachioradialis, patella and achilles.   Hoffman's is absent.   Bilateral upper and lower extremity sensation is intact to light touch.    No evidence of dysmetria noted.  Gait is abnormal.  She has moderate difficulty with tandem gait   Medical Decision Making  Imaging: MRI C spine 04/22/2023 Cord: Normal signal and morphology.   Posterior Fossa, vertebral arteries, paraspinal tissues: T2 hyperintense signal in the pons is favored to represent sequela of chronic microvascular ischemic change.   C1-C2: Mild degenerative change.   C2-C3: Moderate bilateral facet degenerative change. No significant disc bulge. No spinal canal narrowing. Mild left neural foraminal narrowing.   C3-C4: Severe left and moderate right facet degenerative change. Uncovertebral hypertrophy. Spinal canal narrowing. Severe left neural foraminal narrowing.   C4-C5: Severe disc space loss. Circumferential disc bulge. Uncovertebral hypertrophy. Moderate left and mild right facet degenerative change. Moderate to severe spinal canal narrowing. Moderate to severe bilateral neural foraminal narrowing.   C5-C6: Eccentric left disc bulge. Mild spinal canal narrowing. Uncovertebral hypertrophy. Moderate bilateral facet degenerative change. Moderate right and mild left neural foraminal narrowing.   C6-C7: Circumferential disc bulge. Moderate left and mild right facet degenerative change. Uncovertebral hypertrophy. No significant spinal canal narrowing. Mild bilateral neural foraminal narrowing, left-greater-than-right.   C7-T1: Mild bilateral facet degenerative change. No spinal canal  or neural foraminal narrowing.   IMPRESSION: 1. Moderate to severe spinal canal narrowing at C4-C5. 2. Severe left neural foraminal narrowing at C3-C4. 3. Moderate to severe bilateral neural foraminal narrowing at C4-C5. 4. Moderate right neural foraminal narrowing at C5-C6. 5. Sequela of moderate chronic microvascular ischemic change in the visualized pons.     Electronically Signed   By: Lorenza Cambridge M.D.   On: 04/22/2023 07:32  L spine xrays 03/14/2023 IMPRESSION: Grade 1 anterolisthesis of L4 on L5 with associated degenerative disc and facet disease.     Electronically Signed   By: Annia Belt M.D.   On: 03/14/2023 17:16  I have personally reviewed the images and agree with the above interpretation.  Assessment and Plan: Kristina Mccoy is a pleasant 67 y.o. female with cervical stenosis causing cervical myelopathy.  Her symptoms have worsened since I last saw her.  Her weakness is worse.  Her balance is qualitatively worse.  We reviewed her imaging.  Given that her symptoms are worsening, I recommended pursuing surgical intervention.  I recommended a C4-5 anterior cervical discectomy and fusion to take pressure off her spinal cord.  I discussed the planned procedure at length with the patient, including the risks, benefits, alternatives, and indications. The risks discussed include but are not limited to bleeding, infection, need for reoperation, spinal fluid leak, stroke, vision loss, anesthetic complication, coma, paralysis, and even death. We also discussed the possibility of post-operative dysphagia, vocal cord paralysis, and the risk of adjacent segment disease in the future. I also described in detail that improvement was not guaranteed.  The patient expressed understanding of  these risks. I described the surgery in layman's terms, and gave ample opportunity for questions, which were answered to the best of my ability.  She would like to think about this.  She will let me  know if she would like to pursue surgical intervention.  I spent a total of 30 minutes in this patient's care today. This time was spent reviewing pertinent records including imaging studies, obtaining and confirming history, performing a directed evaluation, formulating and discussing my recommendations, and documenting the visit within the medical record.    Thank you for involving me in the care of this patient.      Amarah Brossman K. Myer Haff MD, [facility] Neurosurgery

## 2023-07-17 DIAGNOSIS — C50112 Malignant neoplasm of central portion of left female breast: Secondary | ICD-10-CM | POA: Diagnosis not present

## 2023-07-17 DIAGNOSIS — C50111 Malignant neoplasm of central portion of right female breast: Secondary | ICD-10-CM | POA: Diagnosis not present

## 2023-07-17 DIAGNOSIS — Z9013 Acquired absence of bilateral breasts and nipples: Secondary | ICD-10-CM | POA: Diagnosis not present

## 2023-07-23 ENCOUNTER — Encounter: Payer: Self-pay | Admitting: Neurosurgery

## 2023-07-24 ENCOUNTER — Encounter: Payer: Self-pay | Admitting: Family Medicine

## 2023-07-29 ENCOUNTER — Telehealth: Payer: Self-pay

## 2023-07-29 ENCOUNTER — Other Ambulatory Visit (HOSPITAL_COMMUNITY): Payer: Self-pay

## 2023-07-29 NOTE — Telephone Encounter (Signed)
PAP: Application for Novolog has been submitted to PAP Companies: [employer], Therapist, sports

## 2023-07-30 DIAGNOSIS — M4316 Spondylolisthesis, lumbar region: Secondary | ICD-10-CM | POA: Diagnosis not present

## 2023-07-30 DIAGNOSIS — Z6828 Body mass index (BMI) 28.0-28.9, adult: Secondary | ICD-10-CM | POA: Diagnosis not present

## 2023-07-30 DIAGNOSIS — Z794 Long term (current) use of insulin: Secondary | ICD-10-CM | POA: Diagnosis not present

## 2023-07-30 DIAGNOSIS — I700 Atherosclerosis of aorta: Secondary | ICD-10-CM | POA: Diagnosis not present

## 2023-07-30 DIAGNOSIS — Z923 Personal history of irradiation: Secondary | ICD-10-CM | POA: Diagnosis not present

## 2023-07-30 DIAGNOSIS — Z9011 Acquired absence of right breast and nipple: Secondary | ICD-10-CM | POA: Diagnosis not present

## 2023-07-30 DIAGNOSIS — J449 Chronic obstructive pulmonary disease, unspecified: Secondary | ICD-10-CM | POA: Diagnosis not present

## 2023-07-30 DIAGNOSIS — Z7951 Long term (current) use of inhaled steroids: Secondary | ICD-10-CM | POA: Diagnosis not present

## 2023-07-30 DIAGNOSIS — Z853 Personal history of malignant neoplasm of breast: Secondary | ICD-10-CM | POA: Diagnosis not present

## 2023-07-30 DIAGNOSIS — I10 Essential (primary) hypertension: Secondary | ICD-10-CM | POA: Diagnosis not present

## 2023-07-30 DIAGNOSIS — Z7982 Long term (current) use of aspirin: Secondary | ICD-10-CM | POA: Diagnosis not present

## 2023-07-30 DIAGNOSIS — E119 Type 2 diabetes mellitus without complications: Secondary | ICD-10-CM | POA: Diagnosis not present

## 2023-08-05 ENCOUNTER — Encounter: Payer: Self-pay | Admitting: Family Medicine

## 2023-08-06 ENCOUNTER — Other Ambulatory Visit: Payer: Self-pay

## 2023-08-06 DIAGNOSIS — F32A Depression, unspecified: Secondary | ICD-10-CM

## 2023-08-06 DIAGNOSIS — E1169 Type 2 diabetes mellitus with other specified complication: Secondary | ICD-10-CM | POA: Diagnosis not present

## 2023-08-06 DIAGNOSIS — Z794 Long term (current) use of insulin: Secondary | ICD-10-CM | POA: Diagnosis not present

## 2023-08-06 MED ORDER — ALPRAZOLAM 0.5 MG PO TABS: ORAL_TABLET | ORAL | 0 refills | Status: DC

## 2023-08-06 NOTE — Telephone Encounter (Signed)
Sent to the doc of the day for refills.

## 2023-08-06 NOTE — Telephone Encounter (Signed)
ALPRAZolam (XANAX) 0.5 MG tablet  Last refilled 06/06/2023 #120 with 1 refill  LOV 06/25/2023 NOV 09/26/2023

## 2023-08-06 NOTE — Telephone Encounter (Signed)
Patient called about having her ALPRAZolam (XANAX) 0.5 MG tablet refilled.

## 2023-08-13 ENCOUNTER — Other Ambulatory Visit (HOSPITAL_COMMUNITY): Payer: Self-pay

## 2023-08-13 NOTE — Telephone Encounter (Signed)
[employer] for status update, NOVO requires physical address (checked 2023 application which had PO box address), gave rep pt's emergency contact address of [facility]. Will need to call company to have changed if necessary in the future. Application is being processed, turnaround time of 24-48 hours.

## 2023-08-14 DIAGNOSIS — K743 Primary biliary cirrhosis: Secondary | ICD-10-CM | POA: Diagnosis not present

## 2023-08-14 DIAGNOSIS — K5900 Constipation, unspecified: Secondary | ICD-10-CM | POA: Diagnosis not present

## 2023-08-14 DIAGNOSIS — K219 Gastro-esophageal reflux disease without esophagitis: Secondary | ICD-10-CM | POA: Diagnosis not present

## 2023-08-19 ENCOUNTER — Encounter: Payer: Self-pay | Admitting: Family Medicine

## 2023-08-19 NOTE — Telephone Encounter (Signed)
I have spoke with Patient and pulled the application the information that I see that is incorrect is the ship to address. I am including pharmacy on this message so someone can call patient and verify how her name was electronically signed because she states she has not given permission. Maybe pharmacy can explain better than I can how this re-enrollment is processed but the form does state on front no PO boxes did they give her emergency contact address. Also patient needs Evaristo Bury added to patient assistance prescribed by Dr. Graciela Husbands at last visit checking front desk for fax patient requested. Adding Lillia Abed.

## 2023-08-19 NOTE — Telephone Encounter (Signed)
Patient called about the MyChart message. She is very upset. Wrong info was sent to [employer]. She is also upset she has to fill out new application that is being fax to office.

## 2023-08-20 NOTE — Telephone Encounter (Signed)
Printed off paperwork and placed in your needs to be signed basket

## 2023-08-20 NOTE — Telephone Encounter (Signed)
Noted. Thank you for helping with this.   Kristina Mccoy can you make sure to get this form for me to sign?

## 2023-08-22 ENCOUNTER — Encounter: Payer: Self-pay | Admitting: Pharmacist

## 2023-08-22 DIAGNOSIS — H2513 Age-related nuclear cataract, bilateral: Secondary | ICD-10-CM | POA: Diagnosis not present

## 2023-08-22 DIAGNOSIS — G453 Amaurosis fugax: Secondary | ICD-10-CM | POA: Diagnosis not present

## 2023-08-22 DIAGNOSIS — H35033 Hypertensive retinopathy, bilateral: Secondary | ICD-10-CM | POA: Diagnosis not present

## 2023-08-22 DIAGNOSIS — E119 Type 2 diabetes mellitus without complications: Secondary | ICD-10-CM | POA: Diagnosis not present

## 2023-08-22 LAB — HM DIABETES EYE EXAM

## 2023-08-22 NOTE — Progress Notes (Signed)
Patient called clinic requesting to speak to pharmacist regarding recent encounter w [employer] application.   Returned patient's phone call and discussed her Novo application error again (previously documented). She reports that she was told on the phone with [employer] earlier in the week that her application was canceled per her request due to incorrect address/PCP address.   She reports that her emergency contact received a letter in the mail with her PHI from [employer] indicating that she has been approved for the program. She is concerned that the Novo rep did no cancel her application and wants to ensure the medication is not going to ship out to an incorrect PCP office.   [employer]. Through automated system, patient is still coming up as approved through 09/29/24.   Requested to speak to a rep and confirmed that her application status is marked to be canceled. Rep confirms that no medication has been shipped out, as the application was for 2025 and would not take effect until Jan.   [employer] Patient ID Number for future reference: [id number]  Updated patient. She will plan to sign application in clinic on Monday.   Loree Fee, PharmD Clinical Pharmacist [facility] [phone]

## 2023-08-22 NOTE — Telephone Encounter (Signed)
Noted. Our information and prescription information needs to be filled in. Please get this filled in. Though Kristina Mccoy's note mentions that she placed the forms up front so there may be forms that have already been filled out in my basket at the front. Please check there prior to filling in the forms you gave to me.

## 2023-08-25 ENCOUNTER — Telehealth: Payer: Self-pay | Admitting: Family Medicine

## 2023-08-25 NOTE — Telephone Encounter (Signed)
Pt would like to be called regarding her eye appointment

## 2023-08-25 NOTE — Telephone Encounter (Signed)
Left message to call the office back.

## 2023-08-26 DIAGNOSIS — E1169 Type 2 diabetes mellitus with other specified complication: Secondary | ICD-10-CM | POA: Diagnosis not present

## 2023-08-26 DIAGNOSIS — R002 Palpitations: Secondary | ICD-10-CM | POA: Diagnosis not present

## 2023-08-26 DIAGNOSIS — E785 Hyperlipidemia, unspecified: Secondary | ICD-10-CM | POA: Diagnosis not present

## 2023-08-26 DIAGNOSIS — Z789 Other specified health status: Secondary | ICD-10-CM | POA: Diagnosis not present

## 2023-08-26 DIAGNOSIS — G453 Amaurosis fugax: Secondary | ICD-10-CM | POA: Diagnosis not present

## 2023-08-26 DIAGNOSIS — I219 Acute myocardial infarction, unspecified: Secondary | ICD-10-CM | POA: Diagnosis not present

## 2023-08-26 DIAGNOSIS — I493 Ventricular premature depolarization: Secondary | ICD-10-CM | POA: Diagnosis not present

## 2023-08-26 DIAGNOSIS — I10 Essential (primary) hypertension: Secondary | ICD-10-CM | POA: Diagnosis not present

## 2023-09-01 NOTE — Telephone Encounter (Signed)
Left message to call the office back again.

## 2023-09-01 NOTE — Telephone Encounter (Signed)
Patient returned call and states she had an appointment on the day she messaged [place] and that provider along with the eye doctor took care of her concerns but thanked [place] for calling her to check on her.

## 2023-09-02 ENCOUNTER — Other Ambulatory Visit: Payer: Self-pay | Admitting: Internal Medicine

## 2023-09-02 DIAGNOSIS — F419 Anxiety disorder, unspecified: Secondary | ICD-10-CM

## 2023-09-02 DIAGNOSIS — F32A Depression, unspecified: Secondary | ICD-10-CM

## 2023-09-04 ENCOUNTER — Telehealth: Payer: Self-pay

## 2023-09-04 DIAGNOSIS — E785 Hyperlipidemia, unspecified: Secondary | ICD-10-CM | POA: Diagnosis not present

## 2023-09-04 DIAGNOSIS — R002 Palpitations: Secondary | ICD-10-CM | POA: Diagnosis not present

## 2023-09-04 DIAGNOSIS — I493 Ventricular premature depolarization: Secondary | ICD-10-CM | POA: Diagnosis not present

## 2023-09-04 DIAGNOSIS — Z789 Other specified health status: Secondary | ICD-10-CM | POA: Diagnosis not present

## 2023-09-04 DIAGNOSIS — E1169 Type 2 diabetes mellitus with other specified complication: Secondary | ICD-10-CM | POA: Diagnosis not present

## 2023-09-04 DIAGNOSIS — I10 Essential (primary) hypertension: Secondary | ICD-10-CM | POA: Diagnosis not present

## 2023-09-04 DIAGNOSIS — I219 Acute myocardial infarction, unspecified: Secondary | ICD-10-CM | POA: Diagnosis not present

## 2023-09-04 NOTE — Telephone Encounter (Signed)
[employer] PAP faxed to [phone].  Received confirmation and placed forms in red folder to scan to pt's chart.

## 2023-09-04 NOTE — Telephone Encounter (Signed)
Forms faxed to [employer] PAP.  Placed in red scan folder after receiving confirmation.

## 2023-09-08 ENCOUNTER — Encounter: Payer: Self-pay | Admitting: Pharmacist

## 2023-09-08 ENCOUNTER — Telehealth: Payer: Self-pay

## 2023-09-08 DIAGNOSIS — E785 Hyperlipidemia, unspecified: Secondary | ICD-10-CM | POA: Diagnosis not present

## 2023-09-08 DIAGNOSIS — E1310 Other specified diabetes mellitus with ketoacidosis without coma: Secondary | ICD-10-CM | POA: Diagnosis not present

## 2023-09-08 DIAGNOSIS — M3500 Sicca syndrome, unspecified: Secondary | ICD-10-CM | POA: Diagnosis not present

## 2023-09-08 DIAGNOSIS — M797 Fibromyalgia: Secondary | ICD-10-CM | POA: Diagnosis not present

## 2023-09-08 DIAGNOSIS — K219 Gastro-esophageal reflux disease without esophagitis: Secondary | ICD-10-CM | POA: Diagnosis not present

## 2023-09-08 LAB — HEMOGLOBIN A1C: Hemoglobin A1C: 7.9

## 2023-09-08 NOTE — Progress Notes (Signed)
Medication Assistance: [employer]  Per patient request, I spoke with [employer] this morning and they provided a few updates:   2025 Re-enrollment Patient's address and information has been correctly updated in their system. The old/incorrect address is no longer on her profile.   Regarding 2025 re-enrollment, [employer] confirms receipt of all required information. However, their electronic income verification tool was not able to provide them with accurate income info. They are requesting a copy of proof of income.   2024 Medication Dose Change/Refill Request Form Regarding Dose Change/refill form for the end of 2024, [employer] states that they provide patients with a maximum of 4 shipments per year. They report that they have sent out 4 shipments already this year and that the next shipment would have to be 2025. Additionally, proof of income is needed, as above. Of note, Novo does not provide any medication shipments past 11/30 regardless of refills remaining (for MedicareD patients)  MyChart message response sent to patient with the above information.  Next steps:  Regarding income documentation, this can be sent to clinic, or directly to [employer] may mail or fax to our clinic at [facility] at [facility]  [address]  [place], [address]  Clinic Fax: [phone]   You may mail or fax directly to [employer].  [address]  [place], [place] [address]  [employer] Fax: [phone]    Loree Fee, PharmD Clinical Pharmacist [facility] [phone]

## 2023-09-08 NOTE — Telephone Encounter (Signed)
Patient states she has received a patient notification letter from [employer], with a partially incorrect address, and the letter states that no product was selected on the application.  Patient states the letter also tells her that they have not received the missing or unclear information from her healthcare provider and they cannot process her application until they receive this information.  Patient states the letter tells her to reach out to her healthcare provider.  Patient states she received a message from Berenice Primas, [facility], this morning that said she spoke with [employer] and patient's address was correct.  Patient states she would like for Berenice Primas, [facility], to please call her.

## 2023-09-08 NOTE — Telephone Encounter (Signed)
Error

## 2023-09-19 DIAGNOSIS — E119 Type 2 diabetes mellitus without complications: Secondary | ICD-10-CM | POA: Diagnosis not present

## 2023-09-19 DIAGNOSIS — G939 Disorder of brain, unspecified: Secondary | ICD-10-CM | POA: Diagnosis not present

## 2023-09-19 DIAGNOSIS — H53031 Strabismic amblyopia, right eye: Secondary | ICD-10-CM | POA: Diagnosis not present

## 2023-09-19 DIAGNOSIS — H2513 Age-related nuclear cataract, bilateral: Secondary | ICD-10-CM | POA: Diagnosis not present

## 2023-09-26 ENCOUNTER — Encounter: Payer: Self-pay | Admitting: Family Medicine

## 2023-09-26 ENCOUNTER — Ambulatory Visit (INDEPENDENT_AMBULATORY_CARE_PROVIDER_SITE_OTHER): Payer: PPO | Admitting: Family Medicine

## 2023-09-26 VITALS — BP 132/68 | HR 85 | Ht 64.0 in | Wt 166.6 lb

## 2023-09-26 DIAGNOSIS — G4700 Insomnia, unspecified: Secondary | ICD-10-CM | POA: Diagnosis not present

## 2023-09-26 DIAGNOSIS — G453 Amaurosis fugax: Secondary | ICD-10-CM | POA: Insufficient documentation

## 2023-09-26 DIAGNOSIS — G894 Chronic pain syndrome: Secondary | ICD-10-CM

## 2023-09-26 DIAGNOSIS — Z794 Long term (current) use of insulin: Secondary | ICD-10-CM

## 2023-09-26 DIAGNOSIS — K219 Gastro-esophageal reflux disease without esophagitis: Secondary | ICD-10-CM | POA: Diagnosis not present

## 2023-09-26 DIAGNOSIS — M5412 Radiculopathy, cervical region: Secondary | ICD-10-CM | POA: Diagnosis not present

## 2023-09-26 DIAGNOSIS — F419 Anxiety disorder, unspecified: Secondary | ICD-10-CM

## 2023-09-26 DIAGNOSIS — F32A Depression, unspecified: Secondary | ICD-10-CM

## 2023-09-26 DIAGNOSIS — R2241 Localized swelling, mass and lump, right lower limb: Secondary | ICD-10-CM | POA: Diagnosis not present

## 2023-09-26 DIAGNOSIS — B351 Tinea unguium: Secondary | ICD-10-CM | POA: Diagnosis not present

## 2023-09-26 DIAGNOSIS — E119 Type 2 diabetes mellitus without complications: Secondary | ICD-10-CM

## 2023-09-26 MED ORDER — PANTOPRAZOLE SODIUM 40 MG PO TBEC: 40.0000 mg | DELAYED_RELEASE_TABLET | Freq: Every day | ORAL | 1 refills | Status: DC

## 2023-09-26 MED ORDER — HYDROCODONE-ACETAMINOPHEN 5-325 MG PO TABS: 1.0000 | ORAL_TABLET | Freq: Four times a day (QID) | ORAL | 0 refills | Status: DC | PRN

## 2023-09-26 NOTE — Patient Instructions (Signed)
Nice to see you. You can try the hydrocodone for your pain. Do not take it at the same time as you xanax.  Do not take tylenol when you take the hydrocodone.  Please stop taking the hydrocodone if you get excessively drowsy while taking it.

## 2023-09-26 NOTE — Assessment & Plan Note (Signed)
Chronic issue.  Generally stable.  She will continue Xanax 0.5 mg 4 times daily as needed for anxiety.

## 2023-09-26 NOTE — Assessment & Plan Note (Signed)
Patient will complete workup through cardiology.

## 2023-09-26 NOTE — Assessment & Plan Note (Signed)
Undetermined cause.  She will have the foot specialist look at this when she sees them in the next several weeks.

## 2023-09-26 NOTE — Assessment & Plan Note (Signed)
Chronic issue.  Discussed Lamisil would not be a great option for her given her chronic liver issues.  She will monitor for now.

## 2023-09-26 NOTE — Assessment & Plan Note (Addendum)
Chronic issue.  Patient cannot take NSAIDs anymore given that she is on Eliquis.  Tramadol makes her feel odd.  We will switch her over to hydrocodone.  Patient was advised this would be a short-term supply.  Advised not to take this at the same time as her Xanax.  Advised to monitor for drowsiness with this and if it makes her excessively drowsy she should discontinue use of the hydrocodone and let us know.  If she ends up needing this kind of medication long-term we would need to have her see pain management to discuss treatment options.

## 2023-09-26 NOTE — Assessment & Plan Note (Signed)
Chronic issue.  She will continue to follow with neurosurgery and she will try to decide on whether or not she wants to proceed with surgery for this issue.

## 2023-09-26 NOTE — Progress Notes (Addendum)
[name], MD Phone: [phone]  Kristina Mccoy is a 67 y.o. female who presents today for follow-up.  Anxiety: Patient notes generally stable.  She is taking Xanax 4 times daily.  Notes no drowsiness with this.  Amaurosis fugax: Patient saw cardiology for this.  Had transient loss of vision in left eye.  She saw her ophthalmologist who was concerned about this and had her see cardiology.  Patient had been having some palpitations as well.  Patient reports carotid ultrasound revealed some carotid artery disease.  She has worn a heart monitor and they are waiting on the results.  Cardiology preemptively placed her on Eliquis given the likelihood of atrial fibrillation and took her off of aspirin.  Chronic pain: Patient notes she is rarely taking NSAIDs since she went on Eliquis.  Tylenol does not provide much benefit.  Tramadol makes her feel odd.  She is in the process of proceeding with a spinal surgery given cervical spinal cord compression.  Onychomycosis: Patient saw podiatry.  They recommended against Lamisil given her chronic liver issues.  She notes they did discuss topical options with her.  Patient reports a nodule in the bottom of her right foot.  She is going to see a diabetic foot specialist in the near future.  Social History   Tobacco Use  Smoking Status Former   Current packs/day: 0.00   Average packs/day: 1 pack/day for 30.0 years (30.0 ttl pk-yrs)   Types: Cigarettes   Start date: 03/01/1983   Quit date: 02/28/2013   Years since quitting: 10.5  Smokeless Tobacco Never    Current Outpatient Medications on File Prior to Visit  Medication Sig Dispense Refill   albuterol (PROVENTIL) (2.5 MG/3ML) 0.083% nebulizer solution Take 3 mLs (2.5 mg total) by nebulization every 4 (four) hours as needed for wheezing or shortness of breath. 75 mL 5   Albuterol-Budesonide (AIRSUPRA) 90-80 MCG/ACT AERO Inhale 2 puffs into the lungs every 4 (four) hours as needed (wheezing or  shortness of breath). 10.7 g 1   ALPRAZolam (XANAX) 0.5 MG tablet TAKE 1 TABLET BY MOUTH 4 TIMES A DAY AS NEEDED FOR ANXIETY 120 tablet 0   baclofen (LIORESAL) 10 MG tablet Take 1 tablet (10 mg total) by mouth 3 (three) times daily as needed for muscle spasms. 90 tablet 1   candesartan (ATACAND) 8 MG tablet Take 4 mg by mouth daily.     Continuous Blood Gluc Sensor (FREESTYLE LIBRE 14 DAY SENSOR) MISC SMARTSIG:1 Each Topical Every 2 Weeks     diclofenac Sodium (VOLTAREN) 1 % GEL Apply 1 Application topically 4 (four) times daily.     ELIQUIS 5 MG TABS tablet Take 5 mg by mouth 2 (two) times daily.     Emollient (CERAVE) CREA Apply topically daily.      ezetimibe (ZETIA) 10 MG tablet Take 10 mg by mouth daily.     fluticasone furoate-vilanterol (BREO ELLIPTA) 200-25 MCG/ACT AEPB Inhale 1 puff into the lungs daily. 1 each 5   Glucagon 3 MG/DOSE POWD 1 spray into the left nostril every fifteen (15) minutes as needed (low blood sugar requiring assistance).     hydrocortisone 2.5 % cream APPLY TO AFFECTED AREA TWICE A DAY     ibuprofen (ADVIL) 200 MG tablet Take 200 mg by mouth every 6 (six) hours as needed.     insulin aspart (NOVOLOG) 100 UNIT/ML FlexPen Inject 12 Units into the skin 3 (three) times daily with meals. 15 mL 3   Insulin Pen  Needle 32G X 6 MM MISC Use 3 times daily with meals. 1 each 3   ketoconazole (NIZORAL) 2 % cream Apply 1 application topically 2 (two) times daily as needed.  3   ketoconazole (NIZORAL) 2 % shampoo Apply 1 application  topically as needed.  11   Lancets (ONETOUCH DELICA PLUS LANCET30G) MISC 4 (four) times daily.     metoprolol succinate (TOPROL-XL) 25 MG 24 hr tablet Take 1 tablet (25 mg total) by mouth daily. 90 tablet 3   ondansetron (ZOFRAN) 4 MG tablet TAKE 1 TABLET BY MOUTH EVERY 8 HOURS AS NEEDED FOR NAUSEA AND VOMITING 18 tablet 1   ONETOUCH ULTRA test strip 4 (four) times daily. for testing     pseudoephedrine-guaifenesin (MUCINEX D) 60-600 MG 12 hr tablet  Take 1 tablet by mouth 2 (two) times daily as needed.      TRESIBA FLEXTOUCH 200 UNIT/ML FlexTouch Pen Inject 10 Units into the skin daily.     triamcinolone (NASACORT) 55 MCG/ACT AERO nasal inhaler Place 2 sprays into the nose daily.     ursodiol (ACTIGALL) 500 MG tablet Take 1,000 mg by mouth daily.     No current facility-administered medications on file prior to visit.     ROS see history of present illness  Objective  Physical Exam Vitals:   09/26/23 1432  BP: 132/68  Pulse: 85  SpO2: 98%    BP Readings from Last 3 Encounters:  09/26/23 132/68  07/15/23 134/80  06/25/23 136/76   Wt Readings from Last 3 Encounters:  09/26/23 166 lb 9.6 oz (75.6 kg)  07/15/23 166 lb (75.3 kg)  06/25/23 167 lb 12.8 oz (76.1 kg)    Physical Exam Constitutional:      General: She is not in acute distress.    Appearance: She is not diaphoretic.  Cardiovascular:     Rate and Rhythm: Normal rate and regular rhythm.     Heart sounds: Normal heart sounds.  Pulmonary:     Effort: Pulmonary effort is normal.     Breath sounds: Normal breath sounds.  Skin:    General: Skin is warm and dry.  Neurological:     Mental Status: She is alert.    Diabetic Foot Exam - Simple   Simple Foot Form Diabetic Foot exam was performed with the following findings: Yes 09/26/2023  3:33 PM  Visual Inspection See comments: Yes Sensation Testing Intact to touch and monofilament testing bilaterally: Yes Pulse Check Posterior Tibialis and Dorsalis pulse intact bilaterally: Yes Comments Onychomycosis right great toenail, no other deformities, ulcerations, or skin breakdown, there is a nodule in the midportion of her right plantar fascia, nontender     Assessment/Plan: Please see individual problem list.  Anxiety and depression Assessment & Plan: Chronic issue.  Generally stable.  She will continue Xanax 0.5 mg 4 times daily as needed for anxiety.   Type 2 diabetes mellitus without complication,  with long-term current use of insulin (HCC)  Gastroesophageal reflux disease, unspecified whether esophagitis present -     Pantoprazole Sodium; Take 1 tablet (40 mg total) by mouth daily.  Dispense: 90 tablet; Refill: 1  Insomnia, unspecified type  Chronic pain disorder Assessment & Plan: Chronic issue.  Patient cannot take NSAIDs anymore given that she is on Eliquis.  Tramadol makes her feel odd.  We will switch her over to hydrocodone.  Patient was advised this would be a short-term supply.  Advised not to take this at the same time as her Xanax.  Advised to monitor for drowsiness with this and if it makes her excessively drowsy she should discontinue use of the hydrocodone and let us know.  If she ends up needing this kind of medication long-term we would need to have her see pain management to discuss treatment options.   Cervical radiculopathy Assessment & Plan: Chronic issue.  She will continue to follow with neurosurgery and she will try to decide on whether or not she wants to proceed with surgery for this issue.   Onychomycosis Assessment & Plan: Chronic issue.  Discussed Lamisil would not be a great option for her given her chronic liver issues.  She will monitor for now.   Subcutaneous nodule of right foot Assessment & Plan: Undetermined cause.  She will have the foot specialist look at this when she sees them in the next several weeks.   Amaurosis fugax Assessment & Plan: Patient will complete workup through cardiology.   Other orders -     HYDROcodone-Acetaminophen; Take 1 tablet by mouth every 6 (six) hours as needed for moderate pain (pain score 4-6).  Dispense: 15 tablet; Refill: 0     Return for as scheduled.   [name], MD [facility]

## 2023-09-30 DIAGNOSIS — E785 Hyperlipidemia, unspecified: Secondary | ICD-10-CM | POA: Diagnosis not present

## 2023-09-30 DIAGNOSIS — I219 Acute myocardial infarction, unspecified: Secondary | ICD-10-CM | POA: Diagnosis not present

## 2023-09-30 DIAGNOSIS — E1169 Type 2 diabetes mellitus with other specified complication: Secondary | ICD-10-CM | POA: Diagnosis not present

## 2023-09-30 DIAGNOSIS — I493 Ventricular premature depolarization: Secondary | ICD-10-CM | POA: Diagnosis not present

## 2023-09-30 DIAGNOSIS — I10 Essential (primary) hypertension: Secondary | ICD-10-CM | POA: Diagnosis not present

## 2023-09-30 DIAGNOSIS — R002 Palpitations: Secondary | ICD-10-CM | POA: Diagnosis not present

## 2023-09-30 DIAGNOSIS — Z789 Other specified health status: Secondary | ICD-10-CM | POA: Diagnosis not present

## 2023-10-02 ENCOUNTER — Telehealth: Payer: Self-pay

## 2023-10-02 ENCOUNTER — Other Ambulatory Visit: Payer: Self-pay | Admitting: Family Medicine

## 2023-10-02 DIAGNOSIS — F419 Anxiety disorder, unspecified: Secondary | ICD-10-CM

## 2023-10-02 NOTE — Telephone Encounter (Signed)
 Copied from CRM [id number]. Topic: General - Other >> Oct 02, 2023 10:06 AM Robinson H wrote: Reason for CRM: Patient states she has a missed call from the office at 9:34 am this morning and no message, calling back to see why/who called. Please reach out to patient, thanks.  Nobie Alleyne [phone]

## 2023-10-03 ENCOUNTER — Telehealth: Payer: Self-pay | Admitting: Family Medicine

## 2023-10-03 NOTE — Telephone Encounter (Signed)
This was sent in earlier today

## 2023-10-03 NOTE — Telephone Encounter (Signed)
 Copied from CRM [id number]. Topic: Clinical - Medication Refill >> Oct 03, 2023 10:22 AM Annabella DEL wrote: Most Recent Primary Care Visit:  Provider: MARIBETH ERIC G  Department: [facility]  Visit Type: OFFICE VISIT  Date: 09/26/2023  Medication: Xanax  0.5mg  4x  Has the patient contacted their pharmacy? Yes (Agent: If no, request that the patient contact the pharmacy for the refill. If patient does not wish to contact the pharmacy document the reason why and proceed with request.) (Agent: If yes, when and what did the pharmacy advise?)  Is this the correct pharmacy for this prescription? Yes If no, delete pharmacy and type the correct one.  This is the patient's preferred pharmacy:  [facility] [address] [place], [place] - [address] [place] [address] Phone: [phone] Fax: [phone]  [facility] - [place], [place] - [address] [place] [address] Phone: [phone] Fax: [phone]   Has the prescription been filled recently? No  Is the patient out of the medication? Yes  Has the patient been seen for an appointment in the last year OR does the patient have an upcoming appointment? Yes  Can we respond through MyChart? No  Agent: Please be advised that Rx refills may take up to 3 business days. We ask that you follow-up with your pharmacy.

## 2023-10-09 ENCOUNTER — Ambulatory Visit: Payer: PPO | Admitting: Podiatry

## 2023-10-13 ENCOUNTER — Encounter: Payer: Self-pay | Admitting: Pharmacist

## 2023-10-13 NOTE — Progress Notes (Addendum)
 Manufacturer Assistance Program (MAP) Application   Manufacturer: [employer]    (Re-enrollment) Medication(s): Tresiba U-100, Novolog , NovoFine needles Phone = [phone] (1 month insulin  free once a year) Patient Portion of Application:  1/14: Called [employer]. They report the same issue as yesterday. Ask for 24-48 hours to resolve the issue... I argued that we were told this yesterday. Rep assures me that an IT ticket has been placed by her superior with a detailed note. She also provides the following phone number for a 19-month supply of insulin  free of charge (can be used only once per year). Phone = [phone] I did call the [employer] line above though they report they are unable to assist with enrollment in the free supply as the patient must be the one to call.  1/13: Called [employer]. Got through to an agent who placed me on hold for 17 minutes then call was dropped. Agent called back reporting they are having an issue with enrollment and he has to speak with someone higher up to help him. Instructed to call back tomorrow at which time issue should be resolved.  1/9: Automated system still reports missing information. On hold with [employer] >20 minutes. Unsuccessful outreach. 1/5: Patient message stating received notice from [employer] dated 09/19/23 reporting HCP information is missing. Provider page re-faxed over with picture of patient insurance card 12/17: Income verification faxed to [employer] along with patient pages of application again as to identify patient.  12/9: Patient's address and information has been correctly updated in their system. The old/incorrect address is no longer on her profile.  12/5: PCP signature complete. Paper application faxed to [employer] with all information reviewed by patient in clinic.  11/25: Patient signed paper application in person at clinic. Patient confirmed insulin  dosing per [facility]. Application placed in PCP inbasket for signature.  11/22: Patient called reporting  that [employer] has the incorrect address on file (address listed is that of her emergency contact). Requests paper application be filled out. She will sign in clinic.  11/13: Completed via online enrollment tool with assistance from [facility] Patient Advocate team. Income Documentation: N/A - Electronic verification elected.  Income documentation brought into clinic by patient per [employer] letter requesting proof of income. Faxed to [employer] w confirmation 12/17.   Provider Portion of Application:  Faxed with patient portion of application.  Receipt confirmed by [employer] Representative.  Prescription(s): Included in MAP application. Tresiba U100 5 units daily (MDD 9 unit) Novolog  4 unit TID +SS (MDD 24 unit) NovoFine Needle: 4 box     Application Status:  10/20/23: APPROVED though 09/25/2024 for all medications requested 12/9: [employer] confirms receipt of all required patient and provider information. However, electronic income verification tool was not able to provide them with accurate income info. They are requesting a copy of proof of income. Faxed 12/17.   *[facility] clinic team - Please Addend/update this note as the Next Steps are completed in office*

## 2023-10-14 DIAGNOSIS — I493 Ventricular premature depolarization: Secondary | ICD-10-CM | POA: Diagnosis not present

## 2023-10-14 DIAGNOSIS — G453 Amaurosis fugax: Secondary | ICD-10-CM | POA: Diagnosis not present

## 2023-10-14 DIAGNOSIS — E785 Hyperlipidemia, unspecified: Secondary | ICD-10-CM | POA: Diagnosis not present

## 2023-10-14 DIAGNOSIS — Z789 Other specified health status: Secondary | ICD-10-CM | POA: Diagnosis not present

## 2023-10-14 DIAGNOSIS — I10 Essential (primary) hypertension: Secondary | ICD-10-CM | POA: Diagnosis not present

## 2023-10-14 DIAGNOSIS — E1169 Type 2 diabetes mellitus with other specified complication: Secondary | ICD-10-CM | POA: Diagnosis not present

## 2023-10-14 DIAGNOSIS — I219 Acute myocardial infarction, unspecified: Secondary | ICD-10-CM | POA: Diagnosis not present

## 2023-10-21 DIAGNOSIS — L578 Other skin changes due to chronic exposure to nonionizing radiation: Secondary | ICD-10-CM | POA: Diagnosis not present

## 2023-10-21 DIAGNOSIS — L218 Other seborrheic dermatitis: Secondary | ICD-10-CM | POA: Diagnosis not present

## 2023-10-21 DIAGNOSIS — Z86018 Personal history of other benign neoplasm: Secondary | ICD-10-CM | POA: Diagnosis not present

## 2023-10-21 DIAGNOSIS — Z85820 Personal history of malignant melanoma of skin: Secondary | ICD-10-CM | POA: Diagnosis not present

## 2023-10-21 DIAGNOSIS — L508 Other urticaria: Secondary | ICD-10-CM | POA: Diagnosis not present

## 2023-10-23 DIAGNOSIS — I219 Acute myocardial infarction, unspecified: Secondary | ICD-10-CM | POA: Diagnosis not present

## 2023-10-23 DIAGNOSIS — I493 Ventricular premature depolarization: Secondary | ICD-10-CM | POA: Diagnosis not present

## 2023-10-23 DIAGNOSIS — G453 Amaurosis fugax: Secondary | ICD-10-CM | POA: Diagnosis not present

## 2023-10-24 ENCOUNTER — Encounter: Payer: PPO | Admitting: Family Medicine

## 2023-10-29 ENCOUNTER — Encounter: Payer: Self-pay | Admitting: Family Medicine

## 2023-10-29 ENCOUNTER — Ambulatory Visit: Payer: PPO | Admitting: Family Medicine

## 2023-10-29 VITALS — BP 110/60 | HR 76 | Temp 97.7°F | Resp 18 | Ht 64.0 in | Wt 171.1 lb

## 2023-10-29 DIAGNOSIS — J309 Allergic rhinitis, unspecified: Secondary | ICD-10-CM | POA: Diagnosis not present

## 2023-10-29 DIAGNOSIS — F419 Anxiety disorder, unspecified: Secondary | ICD-10-CM

## 2023-10-29 DIAGNOSIS — G453 Amaurosis fugax: Secondary | ICD-10-CM

## 2023-10-29 DIAGNOSIS — F32A Depression, unspecified: Secondary | ICD-10-CM | POA: Diagnosis not present

## 2023-10-29 MED ORDER — ALPRAZOLAM 0.5 MG PO TABS: ORAL_TABLET | ORAL | 2 refills | Status: DC

## 2023-10-29 MED ORDER — AZELASTINE HCL 0.1 % NA SOLN: 2.0000 | Freq: Two times a day (BID) | NASAL | 2 refills | Status: DC

## 2023-10-29 NOTE — Assessment & Plan Note (Addendum)
Chronic issue.  She is still completing workup through cardiology.  They have her on Eliquis 5 mg twice daily.  She will complete this workup.  I am referring her to [facility] neurology at her request.  I did discuss the possible mechanisms of how this would develop with having a clot come from carotid disease versus atrial fibrillation.  Discussed very unlikely would it come from the distal extremity.

## 2023-10-29 NOTE — Assessment & Plan Note (Signed)
Chronic issue.  Generally stable.  She will continue Xanax 0.5 mg 4 times daily as needed for anxiety.

## 2023-10-29 NOTE — Progress Notes (Signed)
Kristina Alar, MD Phone: [phone]  Kristina Mccoy is a 68 y.o. female who presents today for follow-up.  Anxiety/depression: Patient reports anxiety is generally stable.  Occasionally feels depressed.  Prayer and going through scriptures help with this.  No SI.  Continues on Xanax.  Amaurosis fugax: Patient notes occasionally having sharp pain in her left eye.  Feels a fluttering behind her eye.  She has had evaluation by her cardiologist and ophthalmology for this.  She is wondering about a neurology referral.  She is on Eliquis 5 mg twice daily.  Reports carotid Dopplers revealed mild plaque buildup.  Patient notes she has a 30-day heart monitor on at this time.  Allergic rhinitis: Patient notes she occasionally uses Nasonex when she has excessive sneezing.  Notes a nosebleed that was very mild earlier today.  Social History   Tobacco Use  Smoking Status Former   Current packs/day: 0.00   Average packs/day: 1 pack/day for 30.0 years (30.0 ttl pk-yrs)   Types: Cigarettes   Start date: 03/01/1983   Quit date: 02/28/2013   Years since quitting: 10.6  Smokeless Tobacco Never    Current Outpatient Medications on File Prior to Visit  Medication Sig Dispense Refill   albuterol (PROVENTIL) (2.5 MG/3ML) 0.083% nebulizer solution Take 3 mLs (2.5 mg total) by nebulization every 4 (four) hours as needed for wheezing or shortness of breath. 75 mL 5   Albuterol-Budesonide (AIRSUPRA) 90-80 MCG/ACT AERO Inhale 2 puffs into the lungs every 4 (four) hours as needed (wheezing or shortness of breath). 10.7 g 1   baclofen (LIORESAL) 10 MG tablet Take 1 tablet (10 mg total) by mouth 3 (three) times daily as needed for muscle spasms. 90 tablet 1   candesartan (ATACAND) 8 MG tablet Take 4 mg by mouth daily.     Continuous Blood Gluc Sensor (FREESTYLE LIBRE 14 DAY SENSOR) MISC SMARTSIG:1 Each Topical Every 2 Weeks     diclofenac Sodium (VOLTAREN) 1 % GEL Apply 1 Application topically 4 (four) times  daily.     ELIQUIS 5 MG TABS tablet Take 5 mg by mouth 2 (two) times daily.     Emollient (CERAVE) CREA Apply topically daily.      ezetimibe (ZETIA) 10 MG tablet Take 10 mg by mouth daily.     fluticasone furoate-vilanterol (BREO ELLIPTA) 200-25 MCG/ACT AEPB Inhale 1 puff into the lungs daily. 1 each 5   Glucagon 3 MG/DOSE POWD 1 spray into the left nostril every fifteen (15) minutes as needed (low blood sugar requiring assistance).     HYDROcodone-acetaminophen (NORCO/VICODIN) 5-325 MG tablet Take 1 tablet by mouth every 6 (six) hours as needed for moderate pain (pain score 4-6). 15 tablet 0   hydrocortisone 2.5 % cream APPLY TO AFFECTED AREA TWICE A DAY     ibuprofen (ADVIL) 200 MG tablet Take 200 mg by mouth every 6 (six) hours as needed.     insulin aspart (NOVOLOG) 100 UNIT/ML FlexPen Inject 12 Units into the skin 3 (three) times daily with meals. 15 mL 3   Insulin Pen Needle 32G X 6 MM MISC Use 3 times daily with meals. 1 each 3   ketoconazole (NIZORAL) 2 % cream Apply 1 application topically 2 (two) times daily as needed.  3   ketoconazole (NIZORAL) 2 % shampoo Apply 1 application  topically as needed.  11   Lancets (ONETOUCH DELICA PLUS LANCET30G) MISC 4 (four) times daily.     metoprolol succinate (TOPROL-XL) 25 MG 24 hr tablet  Take 1 tablet (25 mg total) by mouth daily. 90 tablet 3   ondansetron (ZOFRAN) 4 MG tablet TAKE 1 TABLET BY MOUTH EVERY 8 HOURS AS NEEDED FOR NAUSEA AND VOMITING 18 tablet 1   ONETOUCH ULTRA test strip 4 (four) times daily. for testing     pantoprazole (PROTONIX) 40 MG tablet Take 1 tablet (40 mg total) by mouth daily. 90 tablet 1   pseudoephedrine-guaifenesin (MUCINEX D) 60-600 MG 12 hr tablet Take 1 tablet by mouth 2 (two) times daily as needed.      TRESIBA FLEXTOUCH 200 UNIT/ML FlexTouch Pen Inject 10 Units into the skin daily.     triamcinolone (NASACORT) 55 MCG/ACT AERO nasal inhaler Place 2 sprays into the nose daily.     ursodiol (ACTIGALL) 500 MG tablet  Take 1,000 mg by mouth daily.     No current facility-administered medications on file prior to visit.     ROS see history of present illness  Objective  Physical Exam Vitals:   10/29/23 1313  BP: 110/60  Pulse: 76  Resp: 18  Temp: 97.7 F (36.5 C)  SpO2: 100%    BP Readings from Last 3 Encounters:  10/29/23 110/60  09/26/23 132/68  07/15/23 134/80   Wt Readings from Last 3 Encounters:  10/29/23 171 lb 2 oz (77.6 kg)  09/26/23 166 lb 9.6 oz (75.6 kg)  07/15/23 166 lb (75.3 kg)    Physical Exam Constitutional:      General: She is not in acute distress.    Appearance: She is not diaphoretic.  Cardiovascular:     Rate and Rhythm: Normal rate and regular rhythm.     Heart sounds: Normal heart sounds.  Pulmonary:     Effort: Pulmonary effort is normal.     Breath sounds: Normal breath sounds.  Skin:    General: Skin is warm and dry.  Neurological:     Mental Status: She is alert.      Assessment/Plan: Please see individual problem list.  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  She is still completing workup through cardiology.  They have her on Eliquis 5 mg twice daily.  She will complete this workup.  I am referring her to [facility] neurology at her request.  I did discuss the possible mechanisms of how this would develop with having a clot come from carotid disease versus atrial fibrillation.  Discussed very unlikely would it come from the distal extremity.  Orders: -     Ambulatory referral to Neurology  Allergic rhinitis, unspecified seasonality, unspecified trigger Assessment & Plan: Chronic issue.  Patient will discontinue Nasonex given risk of nosebleeds.  She will trial Astelin 2 sprays each nostril twice daily as needed.  Orders: -     Azelastine HCl; Place 2 sprays into both nostrils 2 (two) times daily. Use in each nostril as directed  Dispense: 30 mL; Refill: 2  Anxiety and depression Assessment & Plan: Chronic issue.  Generally stable.   She will continue Xanax 0.5 mg 4 times daily as needed for anxiety.  Orders: -     ALPRAZolam; TAKE 1 TABLET BY MOUTH 4 TIMES A DAY AS NEEDED FOR ANXIETY  Dispense: 120 tablet; Refill: 2     Return for as scheduled.   Kristina Alar, MD [facility]

## 2023-10-29 NOTE — Assessment & Plan Note (Signed)
Chronic issue.  Patient will discontinue Nasonex given risk of nosebleeds.  She will trial Astelin 2 sprays each nostril twice daily as needed.

## 2023-10-30 ENCOUNTER — Encounter: Payer: Self-pay | Admitting: Internal Medicine

## 2023-10-30 ENCOUNTER — Ambulatory Visit: Payer: PPO | Admitting: Internal Medicine

## 2023-10-30 VITALS — BP 116/60 | HR 71 | Temp 97.6°F | Ht 64.0 in | Wt 171.0 lb

## 2023-10-30 DIAGNOSIS — J4520 Mild intermittent asthma, uncomplicated: Secondary | ICD-10-CM | POA: Diagnosis not present

## 2023-10-30 DIAGNOSIS — G4733 Obstructive sleep apnea (adult) (pediatric): Secondary | ICD-10-CM | POA: Diagnosis not present

## 2023-10-30 DIAGNOSIS — R918 Other nonspecific abnormal finding of lung field: Secondary | ICD-10-CM | POA: Diagnosis not present

## 2023-10-30 MED ORDER — FLUTICASONE FUROATE-VILANTEROL 200-25 MCG/ACT IN AEPB: 1.0000 | INHALATION_SPRAY | Freq: Every day | RESPIRATORY_TRACT | 5 refills | Status: DC

## 2023-10-30 NOTE — Progress Notes (Signed)
[facility]  Pulmonary Medicine   PFTs and mild restriction, RV 74, DLCO 61. Sleep study February 2016 AHI 14.3, AutoPap 5-15.  **Personally reviewed download data, 30 days as of 01/07/18.  Usage greater than 4 hours is 26/30 days.  Average usage on days used is 5 hours 50 minutes.  Set pressure is 8.  Residual AHI is 1.  This demonstrates very good compliance with excellent control of obstructive sleep apnea. **Sleep study, CPAP titration 04/09/16, CPAP was recommended at a pressure of 8.  **Imaging personally reviewed, 01/16/17; lungs unremarkable. **CBC 12/22/17, absolute eosinophil count 300.   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 unchanged over last 2 years   Date: 10/30/2023  MRN# [id number] Kristina Mccoy November 19, 1955  CC Follow-up assessment OSA Follow-up assessment asthma  HPI:  Assessment of asthma Excellent compliance report 100% compliance for days and greater than 4 hours AHI significantly reduced CPAP 8 Patient use and benefits from therapy     patient has a history of Sjogren's syndrome and is being evaluated by [facility]  rheumatology  Patient with A-fib going to [facility] for assessment Atrial fibrillation is not related to her OSA since her OSA is well-controlled  No exacerbation at this time No evidence of heart failure at this time No evidence or signs of infection at this time No respiratory distress No fevers, chills, nausea, vomiting, diarrhea No evidence of lower extremity edema No evidence hemoptysis     CT of the chest reviewed with patient in detail from 07/2020 and July 2024 Patient in the lung cancer screening referral program 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 Patient has history of breast cancer status post XRT There is no significant changes over the last 3 years  Patient was switched back to [facility] inhaler therapy We will need to  continue Breo inhaler therapy as she moved to a new apartment with carpeting Use albuterol as needed Avoid Allergens and Irritants Avoid secondhand smoke Avoid SICK contacts Recommend  Masking  when appropriate Recommend Keep up-to-date with vaccinations      h/o RT breast cancer bilateral mastectomy Surgical Hx:  Past Surgical History:  Procedure Laterality Date   BREAST SURGERY Bilateral    mastectomy   COLONOSCOPY WITH PROPOFOL N/A 04/22/2017   Procedure: COLONOSCOPY WITH PROPOFOL;  Surgeon: Christena Deem, MD;  Location: [facility] ENDOSCOPY;  Service: Endoscopy;  Laterality: N/A;   ESOPHAGOGASTRODUODENOSCOPY (EGD) WITH PROPOFOL N/A 04/22/2017   Procedure: ESOPHAGOGASTRODUODENOSCOPY (EGD) WITH PROPOFOL;  Surgeon: Christena Deem, MD;  Location: [facility] ENDOSCOPY;  Service: Endoscopy;  Laterality: N/A;   MASTECTOMY Bilateral 03/23/13   Family Hx:  Family History  Problem Relation Age of Onset   Diabetes Mother    Heart disease Mother    Hyperlipidemia Mother    Heart failure Mother    Diabetes Other    Pancreatic cancer Father    Heart disease Brother        Aortic valve disease   Hyperlipidemia Brother    Colon polyps Brother    Stroke Maternal Grandmother    Breast cancer Maternal Aunt    Colon cancer Paternal Grandmother    Rheum arthritis Sister    Psoriasis Other        psoriatic arthritis    Heart disease Brother 51       Tachycardia   Heart disease Brother    Ovarian cancer Neg Hx  Social Hx:   Social History   Tobacco Use   Smoking status: Former    Current packs/day: 0.00    Average packs/day: 1 pack/day for 30.0 years (30.0 ttl pk-yrs)    Types: Cigarettes    Start date: 03/01/1983    Quit date: 02/28/2013    Years since quitting: 10.6   Smokeless tobacco: Never  Vaping Use   Vaping status: Never Used  Substance Use Topics   Alcohol use: No    Alcohol/week: 0.0 standard drinks of alcohol   Drug use: No   Medication:    Current Outpatient  Medications:    albuterol (PROVENTIL) (2.5 MG/3ML) 0.083% nebulizer solution, Take 3 mLs (2.5 mg total) by nebulization every 4 (four) hours as needed for wheezing or shortness of breath., Disp: 75 mL, Rfl: 5   Albuterol-Budesonide (AIRSUPRA) 90-80 MCG/ACT AERO, Inhale 2 puffs into the lungs every 4 (four) hours as needed (wheezing or shortness of breath)., Disp: 10.7 g, Rfl: 1   ALPRAZolam (XANAX) 0.5 MG tablet, TAKE 1 TABLET BY MOUTH 4 TIMES A DAY AS NEEDED FOR ANXIETY, Disp: 120 tablet, Rfl: 2   azelastine (ASTELIN) 0.1 % nasal spray, Place 2 sprays into both nostrils 2 (two) times daily. Use in each nostril as directed, Disp: 30 mL, Rfl: 2   baclofen (LIORESAL) 10 MG tablet, Take 1 tablet (10 mg total) by mouth 3 (three) times daily as needed for muscle spasms., Disp: 90 tablet, Rfl: 1   candesartan (ATACAND) 8 MG tablet, Take 4 mg by mouth daily., Disp: , Rfl:    Continuous Blood Gluc Sensor (FREESTYLE LIBRE 14 DAY SENSOR) MISC, SMARTSIG:1 Each Topical Every 2 Weeks, Disp: , Rfl:    diclofenac Sodium (VOLTAREN) 1 % GEL, Apply 1 Application topically 4 (four) times daily., Disp: , Rfl:    ELIQUIS 5 MG TABS tablet, Take 5 mg by mouth 2 (two) times daily., Disp: , Rfl:    Emollient (CERAVE) CREA, Apply topically daily. , Disp: , Rfl:    ezetimibe (ZETIA) 10 MG tablet, Take 10 mg by mouth daily., Disp: , Rfl:    fluticasone furoate-vilanterol (BREO ELLIPTA) 200-25 MCG/ACT AEPB, Inhale 1 puff into the lungs daily., Disp: 1 each, Rfl: 5   Glucagon 3 MG/DOSE POWD, 1 spray into the left nostril every fifteen (15) minutes as needed (low blood sugar requiring assistance)., Disp: , Rfl:    HYDROcodone-acetaminophen (NORCO/VICODIN) 5-325 MG tablet, Take 1 tablet by mouth every 6 (six) hours as needed for moderate pain (pain score 4-6)., Disp: 15 tablet, Rfl: 0   hydrocortisone 2.5 % cream, APPLY TO AFFECTED AREA TWICE A DAY, Disp: , Rfl:    ibuprofen (ADVIL) 200 MG tablet, Take 200 mg by mouth every 6 (six)  hours as needed., Disp: , Rfl:    insulin aspart (NOVOLOG) 100 UNIT/ML FlexPen, Inject 12 Units into the skin 3 (three) times daily with meals., Disp: 15 mL, Rfl: 3   Insulin Pen Needle 32G X 6 MM MISC, Use 3 times daily with meals., Disp: 1 each, Rfl: 3   ketoconazole (NIZORAL) 2 % cream, Apply 1 application topically 2 (two) times daily as needed., Disp: , Rfl: 3   ketoconazole (NIZORAL) 2 % shampoo, Apply 1 application  topically as needed., Disp: , Rfl: 11   Lancets (ONETOUCH DELICA PLUS LANCET30G) MISC, 4 (four) times daily., Disp: , Rfl:    metoprolol succinate (TOPROL-XL) 25 MG 24 hr tablet, Take 1 tablet (25 mg total) by mouth daily., Disp: 90  tablet, Rfl: 3   ondansetron (ZOFRAN) 4 MG tablet, TAKE 1 TABLET BY MOUTH EVERY 8 HOURS AS NEEDED FOR NAUSEA AND VOMITING, Disp: 18 tablet, Rfl: 1   ONETOUCH ULTRA test strip, 4 (four) times daily. for testing, Disp: , Rfl:    pantoprazole (PROTONIX) 40 MG tablet, Take 1 tablet (40 mg total) by mouth daily., Disp: 90 tablet, Rfl: 1   pseudoephedrine-guaifenesin (MUCINEX D) 60-600 MG 12 hr tablet, Take 1 tablet by mouth 2 (two) times daily as needed. , Disp: , Rfl:    TRESIBA FLEXTOUCH 200 UNIT/ML FlexTouch Pen, Inject 10 Units into the skin daily., Disp: , Rfl:    triamcinolone (NASACORT) 55 MCG/ACT AERO nasal inhaler, Place 2 sprays into the nose daily., Disp: , Rfl:    ursodiol (ACTIGALL) 500 MG tablet, Take 1,000 mg by mouth daily., Disp: , Rfl:    Allergies:  Gabapentin, Sodium fluoride, Wound dressing adhesive, Oxycodone, Repatha [evolocumab], Carbamazepine, Doxycycline, Dulaglutide, Ezetimibe, Insulin glargine, Levaquin [levofloxacin], Metformin, Methylprednisolone, Mirtazapine, Morphine, Pregabalin, Rosuvastatin, Venlafaxine, Buprenorphine, Canagliflozin, Escitalopram, and Hydroxychloroquine   BP 116/60 (BP Location: Left Arm, Patient Position: Sitting, Cuff Size: Normal)   Pulse 71   Temp 97.6 F (36.4 C) (Temporal)   Ht 5\' 4"  (1.626 m)    Wt 171 lb (77.6 kg)   SpO2 99%   BMI 29.35 kg/m         Review of Systems: Gen:  Denies  fever, sweats, chills weight loss  HEENT: Denies blurred vision, double vision, ear pain, eye pain, hearing loss, nose bleeds, sore throat Cardiac:  No dizziness, chest pain or heaviness, chest tightness,edema, No JVD Resp:   No cough, -sputum production, -shortness of breath,-wheezing, -hemoptysis,  Other:  All other systems negative   Physical Examination:   General Appearance: No distress  EYES PERRLA, EOM intact.   NECK Supple, No JVD Pulmonary: normal breath sounds, No wheezing.  CardiovascularNormal S1,S2.  No m/r/g.   Abdomen: Benign, Soft, non-tender. Neurology UE/LE 5/5 strength, no focal deficits Ext pulses intact, cap refill intact ALL OTHER ROS ARE NEGATIVE     Assessment and Plan:  68 year old pleasant white female follow-up today for assessment for shortness of breath and dyspnea on exertion likely related to restrictive lung disease and asthma body habitus with a history of Sjogren's disease with a history of radiation fibrosis with underlying sleep apnea   ASTHMA/COPD Well-controlled Continue Breo as prescribed Avoid Allergens and Irritants Avoid secondhand smoke Avoid SICK contacts Recommend  Masking  when appropriate Recommend Keep up-to-date with vaccinations Use albuterol as needed   CT of the chest abnormal radiological finding right upper lobe fibrosis Follow-up CT scans yearly There is no progression of disease over the last several years Follow-up lung cancer screening protocol CT scans revealed in detail with the patient   OSA Excellent compliance report Continue as prescribed CPAP 8 Uses and benefits from therapy Controlled AHI    Sjogren's syndrome follow-up with [facility] rheumatology   Cardiac disease and CHF Follow-up with cardiology as scheduled   Follow-up lung cancer screening protocol CT chest July 2024 reviewed in detail No  evidence of malignancy  MEDICATION ADJUSTMENTS/LABS AND TESTS ORDERED: Continue inhalers as prescribed  Avoid Allergens and Irritants Avoid secondhand smoke Avoid SICK contacts Recommend  Masking  when appropriate Recommend Keep up-to-date with vaccinations Follow-up lung cancer screening protocol annually Continue CPAP as prescribed  Patient satisfied with Plan of action and management. All questions answered  Follow-up in 6 months  Total time spent 42  minutes     Lucie Leather, M.D.  [facility]  Medical Director [facility] Medical Director [facility] Cardio-Pulmonary Department

## 2023-10-30 NOTE — Patient Instructions (Addendum)
Continue inhalers as prescribed  Excellent job with CPAP A+  Avoid Allergens and Irritants Avoid secondhand smoke Avoid SICK contacts Recommend  Masking  when appropriate Recommend Keep up-to-date with vaccinations  Follow-up with cardiology

## 2023-11-03 ENCOUNTER — Ambulatory Visit (INDEPENDENT_AMBULATORY_CARE_PROVIDER_SITE_OTHER): Payer: PPO | Admitting: Podiatry

## 2023-11-03 ENCOUNTER — Encounter: Payer: Self-pay | Admitting: Podiatry

## 2023-11-03 DIAGNOSIS — E109 Type 1 diabetes mellitus without complications: Secondary | ICD-10-CM | POA: Diagnosis not present

## 2023-11-03 DIAGNOSIS — M722 Plantar fascial fibromatosis: Secondary | ICD-10-CM

## 2023-11-03 DIAGNOSIS — M79674 Pain in right toe(s): Secondary | ICD-10-CM

## 2023-11-03 DIAGNOSIS — B351 Tinea unguium: Secondary | ICD-10-CM

## 2023-11-03 DIAGNOSIS — M79675 Pain in left toe(s): Secondary | ICD-10-CM | POA: Diagnosis not present

## 2023-11-03 DIAGNOSIS — M7989 Other specified soft tissue disorders: Secondary | ICD-10-CM | POA: Diagnosis not present

## 2023-11-05 ENCOUNTER — Encounter: Payer: Self-pay | Admitting: Podiatry

## 2023-11-07 DIAGNOSIS — M50321 Other cervical disc degeneration at C4-C5 level: Secondary | ICD-10-CM | POA: Diagnosis not present

## 2023-11-07 DIAGNOSIS — M4802 Spinal stenosis, cervical region: Secondary | ICD-10-CM | POA: Diagnosis not present

## 2023-11-07 DIAGNOSIS — M4312 Spondylolisthesis, cervical region: Secondary | ICD-10-CM | POA: Diagnosis not present

## 2023-11-07 DIAGNOSIS — M47812 Spondylosis without myelopathy or radiculopathy, cervical region: Secondary | ICD-10-CM | POA: Diagnosis not present

## 2023-11-12 ENCOUNTER — Encounter: Payer: Self-pay | Admitting: Family Medicine

## 2023-11-12 MED ORDER — HYDROCODONE-ACETAMINOPHEN 5-325 MG PO TABS: 1.0000 | ORAL_TABLET | Freq: Four times a day (QID) | ORAL | 0 refills | Status: DC | PRN

## 2023-11-20 DIAGNOSIS — G4733 Obstructive sleep apnea (adult) (pediatric): Secondary | ICD-10-CM | POA: Diagnosis not present

## 2023-11-20 DIAGNOSIS — J441 Chronic obstructive pulmonary disease with (acute) exacerbation: Secondary | ICD-10-CM | POA: Diagnosis not present

## 2023-11-23 DIAGNOSIS — I219 Acute myocardial infarction, unspecified: Secondary | ICD-10-CM | POA: Diagnosis not present

## 2023-11-23 DIAGNOSIS — I493 Ventricular premature depolarization: Secondary | ICD-10-CM | POA: Diagnosis not present

## 2023-11-23 DIAGNOSIS — E785 Hyperlipidemia, unspecified: Secondary | ICD-10-CM | POA: Diagnosis not present

## 2023-11-23 DIAGNOSIS — G453 Amaurosis fugax: Secondary | ICD-10-CM | POA: Diagnosis not present

## 2023-11-23 DIAGNOSIS — I10 Essential (primary) hypertension: Secondary | ICD-10-CM | POA: Diagnosis not present

## 2023-11-23 DIAGNOSIS — E1169 Type 2 diabetes mellitus with other specified complication: Secondary | ICD-10-CM | POA: Diagnosis not present

## 2023-11-23 DIAGNOSIS — Z789 Other specified health status: Secondary | ICD-10-CM | POA: Diagnosis not present

## 2023-11-24 DIAGNOSIS — R4789 Other speech disturbances: Secondary | ICD-10-CM | POA: Diagnosis not present

## 2023-11-24 DIAGNOSIS — E785 Hyperlipidemia, unspecified: Secondary | ICD-10-CM | POA: Diagnosis not present

## 2023-11-24 DIAGNOSIS — M3500 Sicca syndrome, unspecified: Secondary | ICD-10-CM | POA: Diagnosis not present

## 2023-11-24 DIAGNOSIS — E1169 Type 2 diabetes mellitus with other specified complication: Secondary | ICD-10-CM | POA: Diagnosis not present

## 2023-11-24 DIAGNOSIS — Z794 Long term (current) use of insulin: Secondary | ICD-10-CM | POA: Diagnosis not present

## 2023-11-24 DIAGNOSIS — G453 Amaurosis fugax: Secondary | ICD-10-CM | POA: Diagnosis not present

## 2023-11-27 ENCOUNTER — Other Ambulatory Visit: Payer: Self-pay | Admitting: Internal Medicine

## 2023-11-27 DIAGNOSIS — J4520 Mild intermittent asthma, uncomplicated: Secondary | ICD-10-CM

## 2023-12-01 ENCOUNTER — Telehealth: Payer: Self-pay

## 2023-12-01 ENCOUNTER — Encounter: Payer: Self-pay | Admitting: Pharmacist

## 2023-12-01 ENCOUNTER — Ambulatory Visit: Payer: Self-pay | Admitting: Family Medicine

## 2023-12-01 NOTE — Telephone Encounter (Signed)
 Chief Complaint: Urinary symptoms Symptoms: Abdominal pressure, slight burning with urination, sharp pelvic pain, darker urine Frequency: 2 days Pertinent Negatives: Patient denies n/a Disposition: [] ED /[] Urgent Care (no appt availability in office) / [x] Appointment(In office/virtual)/ []  [facility] [facility]/ [] Home Care/ [] Refused Recommended Disposition /[] [place] [facility]/ []  Follow-up with PCP Additional Notes: patient called in stating she has been having urinary symptoms since the weekend of abdominal pressure, lower back pain, sharp pains through pelvic area, mild burning with urination, and noticeably darker urine that is improving back to baseline. Patient is on eliquis and uncertain if darker urine is blood. Patient scheduled for tomorrow for further evaluation.   Reason for Disposition  Side (flank) or lower back pain present  Answer Assessment - Initial Assessment Questions 1. SYMPTOM: "What's the main symptom you're concerned about?" (e.g., frequency, incontinence)     Abdominal pressure, sharp pains, felt feverish over weekend, dark urine 2. ONSET: "When did the  urinary symptoms  start?"     Over the weekend 3. PAIN: "Is there any pain?" If Yes, ask: "How bad is it?" (Scale: 1-10; mild, moderate, severe)     6 4. CAUSE: "What do you think is causing the symptoms?"     Possible UTI 5. OTHER SYMPTOMS: "Do you have any other symptoms?" (e.g., blood in urine, fever, flank pain, pain with urination)     Darker urine, abdominal pain, slight pain with urination, lower back pain  Protocols used: Urinary Symptoms-A-AH

## 2023-12-01 NOTE — Progress Notes (Signed)
 Reason for Call: Patient requests to speak to pharmacist regarding PAP  Successful outreach: Spoke with patient 12/01/23.   Summary of Telephone Call:  Patient reports the following questions/concerns:  Insulin dosing was adjusted at recent Endo visit and she was not sure if Novo Nordisk needed to be contacted/updated.  Endo reviewed possibility of adding new medication through Novo PAP. Patient is hesitant given Primary Care manages her PAP.  Patient reports she was scheduled with Pharmacist at [facility] and was considering canceling since working with Pharmacist at [facility].   Plan:  Assured patient we can pull Endo's notes regarding insulin dose adjustments. Will plan for more vague/lenient dosing on next Novo refill as to allow room for titration by Endo. Encouraged her to discuss all medications recommended by Endo. Assured her this is an easy change on her PAP application.  Strongly encouraged she keep her apt with Endo Pharmacist next week. We reviewed benefits of seeing pharm between MD appointments for pharmacotherapy interventions, education, med adjustments, etc.   If Novo shipment delayed beyond 14 business days, advised patient to contact the program via phone to request 30-day voucher for all delayed medications.   PCP Transition of care has been scheduled.  Will need to submit HCP change request on next Novo refill.   Future Appointments  Date Time Provider Department Center  12/02/2023 11:00 AM Bethanie Dicker, NP [facility]  02/02/2024  2:30 PM Freddie Breech, DPM [facility]   Loree Fee, PharmD Clinical Pharmacist [facility] [phone]

## 2023-12-01 NOTE — Telephone Encounter (Signed)
 Copied from CRM [id number]. Topic: General - Call Back - No Documentation >> Dec 01, 2023 12:15 PM [place] J wrote: Reason for CRM: Patient said that someone by the name of Clydie Braun personally told her to call back to discuss transfer of care and patient said she was told to not speak to anyone else regarding topic but the lady she spoke with. I called [facility] to clarify and spoke to Clydie Braun directly but she stated that she never spoke with the patient and had just got back from lunch.

## 2023-12-01 NOTE — Telephone Encounter (Signed)
 Copied from CRM [id number]. Topic: Clinical - Medication Question >> Dec 01, 2023 12:17 PM [place] J wrote: Reason for CRM: Patient would like to speak with Mardella Layman the pharmacist regarding insulin and patient assistance program.

## 2023-12-02 ENCOUNTER — Encounter: Payer: Self-pay | Admitting: Nurse Practitioner

## 2023-12-02 ENCOUNTER — Ambulatory Visit (INDEPENDENT_AMBULATORY_CARE_PROVIDER_SITE_OTHER): Admitting: Nurse Practitioner

## 2023-12-02 ENCOUNTER — Other Ambulatory Visit: Payer: Self-pay

## 2023-12-02 VITALS — BP 120/68 | HR 69 | Temp 97.4°F | Ht 64.0 in | Wt 172.2 lb

## 2023-12-02 DIAGNOSIS — R82998 Other abnormal findings in urine: Secondary | ICD-10-CM | POA: Diagnosis not present

## 2023-12-02 DIAGNOSIS — G453 Amaurosis fugax: Secondary | ICD-10-CM | POA: Diagnosis not present

## 2023-12-02 DIAGNOSIS — D689 Coagulation defect, unspecified: Secondary | ICD-10-CM | POA: Diagnosis not present

## 2023-12-02 DIAGNOSIS — R1030 Lower abdominal pain, unspecified: Secondary | ICD-10-CM

## 2023-12-02 DIAGNOSIS — R899 Unspecified abnormal finding in specimens from other organs, systems and tissues: Secondary | ICD-10-CM

## 2023-12-02 LAB — POC URINALSYSI DIPSTICK (AUTOMATED)
Bilirubin, UA: NEGATIVE
Blood, UA: NEGATIVE
Glucose, UA: NEGATIVE
Ketones, UA: NEGATIVE
Leukocytes, UA: NEGATIVE
Nitrite, UA: NEGATIVE
Protein, UA: NEGATIVE
Spec Grav, UA: 1.01 (ref 1.010–1.025)
Urobilinogen, UA: 1 U/dL
pH, UA: 6.5 (ref 5.0–8.0)

## 2023-12-02 LAB — URINALYSIS, ROUTINE W REFLEX MICROSCOPIC
Bilirubin Urine: NEGATIVE
Hgb urine dipstick: NEGATIVE
Ketones, ur: NEGATIVE
Leukocytes,Ua: NEGATIVE
Nitrite: NEGATIVE
Specific Gravity, Urine: <=1.005 — AB (ref 1.000–1.030)
Total Protein, Urine: NEGATIVE
Urine Glucose: NEGATIVE
Urobilinogen, UA: 0.2 (ref 0.0–1.0)
pH: 6 (ref 5.0–8.0)

## 2023-12-02 LAB — BASIC METABOLIC PANEL
BUN: 21 mg/dL (ref 6–23)
CO2: 28 meq/L (ref 19–32)
Calcium: 9.2 mg/dL (ref 8.4–10.5)
Chloride: 92 meq/L — ABNORMAL LOW (ref 96–112)
Creatinine, Ser: 0.73 mg/dL (ref 0.40–1.20)
GFR: 86.03 mL/min (ref >60.00)
Glucose, Bld: 210 mg/dL — ABNORMAL HIGH (ref 70–99)
Potassium: 4.6 meq/L (ref 3.5–5.1)
Sodium: 125 meq/L — ABNORMAL LOW (ref 135–145)

## 2023-12-02 NOTE — Assessment & Plan Note (Addendum)
 UA negative. Encouraged adequate fluid intake. Microscopic and culture pending.

## 2023-12-02 NOTE — Assessment & Plan Note (Signed)
 Previously evaluated at [facility]. Unsure of exact disorder. 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 Would like referral back for further work up after new amaurosis fugax. Will refer to [facility] Heme/Onc in [place].

## 2023-12-02 NOTE — Progress Notes (Signed)
 Bethanie Dicker, NP-C Phone: [phone]  Kristina Mccoy is a 68 y.o. female who presents today for UTI symptoms.   UTI: Patient reports left sided suprapubic pain started Saturday. It has gradually improved since that time. She does report a history of ovarian cysts and has an upcoming appt with her Ob-Gyn regarding this. She noted that her urine looked darker over the weekend. She has been taking Tylenol and increasing her water intake.   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 She was also recently diagnosed with a-fib after wearing a heart monitor. Which may have contributed to her amaurosis fugax She was started on Eliquis. She is followed by Cardiology and Ophthalmology for these conditions. She is concerned that she may have a blood clotting disorder. She is looking into having surgery on her neck and was told she would need surgical clearance especially after recent events. She notes that she was previously told that she had a blood clotting disorder by [facility] and used to see Heme/Onc there, though she is unsure what disorder she had. She had a positive cardiolipin antibody. She is interested in being referred back to Hematology for further evaluation of clotting disorders/   Social History   Tobacco Use  Smoking Status Former   Current packs/day: 0.00   Average packs/day: 1 pack/day for 30.0 years (30.0 ttl pk-yrs)   Types: Cigarettes   Start date: 03/01/1983   Quit date: 02/28/2013   Years since quitting: 10.7  Smokeless Tobacco Never    Current Outpatient Medications on File Prior to Visit  Medication Sig Dispense Refill   albuterol (PROVENTIL) (2.5 MG/3ML) 0.083% nebulizer solution Take 3 mLs (2.5 mg total) by nebulization every 4 (four) hours as needed for wheezing or  shortness of breath. 75 mL 5   Albuterol-Budesonide (AIRSUPRA) 90-80 MCG/ACT AERO Inhale 2 puffs into the lungs every 4 (four) hours as needed (wheezing or shortness of breath). 10.7 g 1   ALPRAZolam (XANAX) 0.5 MG tablet TAKE 1 TABLET BY MOUTH 4 TIMES A DAY AS NEEDED FOR ANXIETY 120 tablet 2   azelastine (ASTELIN) 0.1 % nasal spray Place 2 sprays into both nostrils 2 (two) times daily. Use in each nostril as directed 30 mL 2   baclofen (LIORESAL) 10 MG tablet Take 1 tablet (10 mg total) by mouth 3 (three) times daily as needed for muscle spasms. 90 tablet 1   candesartan (ATACAND) 8 MG tablet Take 4 mg by mouth daily.     Continuous Blood Gluc Sensor (FREESTYLE LIBRE 14 DAY SENSOR) MISC SMARTSIG:1 Each Topical Every 2 Weeks     diclofenac Sodium (VOLTAREN) 1 % GEL Apply 1 Application topically 4 (four) times daily.     ELIQUIS 5 MG TABS tablet Take 5 mg by mouth 2 (two) times daily.     Emollient (CERAVE) CREA Apply topically daily.      ezetimibe (ZETIA) 10 MG tablet Take 10 mg by mouth daily.     fluticasone-salmeterol (WIXELA INHUB) 500-50 MCG/ACT AEPB Inhale 1 puff into the lungs in the morning and at bedtime. 60 each 0   Glucagon 3 MG/DOSE POWD 1 spray into the left nostril every fifteen (15) minutes as needed (low blood sugar requiring assistance).     HYDROcodone-acetaminophen (  NORCO/VICODIN) 5-325 MG tablet Take 1 tablet by mouth every 6 (six) hours as needed for moderate pain (pain score 4-6). 15 tablet 0   hydrocortisone 2.5 % cream APPLY TO AFFECTED AREA TWICE A DAY     ibuprofen (ADVIL) 200 MG tablet Take 200 mg by mouth every 6 (six) hours as needed.     insulin aspart (NOVOLOG) 100 UNIT/ML FlexPen Inject 12 Units into the skin 3 (three) times daily with meals. 15 mL 3   Insulin Pen Needle 32G X 6 MM MISC Use 3 times daily with meals. 1 each 3   ketoconazole (NIZORAL) 2 % cream Apply 1 application topically 2 (two) times daily as needed.  3   ketoconazole (NIZORAL) 2 % shampoo Apply  1 application  topically as needed.  11   Lancets (ONETOUCH DELICA PLUS LANCET30G) MISC 4 (four) times daily.     metoprolol succinate (TOPROL-XL) 25 MG 24 hr tablet Take 1 tablet (25 mg total) by mouth daily. 90 tablet 3   ondansetron (ZOFRAN) 4 MG tablet TAKE 1 TABLET BY MOUTH EVERY 8 HOURS AS NEEDED FOR NAUSEA AND VOMITING 18 tablet 1   ONETOUCH ULTRA test strip 4 (four) times daily. for testing     pantoprazole (PROTONIX) 40 MG tablet Take 1 tablet (40 mg total) by mouth daily. 90 tablet 1   pseudoephedrine-guaifenesin (MUCINEX D) 60-600 MG 12 hr tablet Take 1 tablet by mouth 2 (two) times daily as needed.      TRESIBA FLEXTOUCH 200 UNIT/ML FlexTouch Pen Inject 10 Units into the skin daily.     triamcinolone (NASACORT) 55 MCG/ACT AERO nasal inhaler Place 2 sprays into the nose daily.     ursodiol (ACTIGALL) 500 MG tablet Take 1,000 mg by mouth daily.     No current facility-administered medications on file prior to visit.    ROS see history of present illness  Objective  Physical Exam Vitals:   12/02/23 1105  BP: 120/68  Pulse: 69  Temp: (!) 97.4 F (36.3 C)  SpO2: 99%    BP Readings from Last 3 Encounters:  12/02/23 120/68  10/30/23 116/60  10/29/23 110/60   Wt Readings from Last 3 Encounters:  12/02/23 172 lb 3.2 oz (78.1 kg)  10/30/23 171 lb (77.6 kg)  10/29/23 171 lb 2 oz (77.6 kg)    Physical Exam Constitutional:      General: She is not in acute distress.    Appearance: Normal appearance.  HENT:     Head: Normocephalic.  Cardiovascular:     Rate and Rhythm: Normal rate and regular rhythm.     Heart sounds: Normal heart sounds.  Pulmonary:     Effort: Pulmonary effort is normal.     Breath sounds: Normal breath sounds.  Skin:    General: Skin is warm and dry.  Neurological:     General: No focal deficit present.     Mental Status: She is alert.  Psychiatric:        Mood and Affect: Mood normal.        Behavior: Behavior normal.    Assessment/Plan:  Please see individual problem list.  Lower abdominal pain Assessment & Plan: Left side, sharp and intermittent since Saturday. Improved today. UA negative in office. Microscopic and culture pending. Will contact patient with results. Hx of ovarian cysts, discussed pain could have been associated with cyst. She has an upcoming appt and [place] with her Ob-Gyn. Return precautions given to patient.   Orders: -  Basic metabolic panel  Dark urine Assessment & Plan: UA negative. Encouraged adequate fluid intake. Microscopic and culture pending.   Orders: -     POCT Urinalysis Dipstick (Automated) -     Urinalysis, Routine w reflex microscopic -     Urine Culture -     Basic metabolic panel  Blood clotting disorder [facility]) Assessment & Plan: Previously evaluated at [facility] Heme/Onc. Unsure of exact disorder. 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 Would like referral back for further work up after new amaurosis fugax. Will refer to [facility] Heme/Onc in [place].   Orders: -     Ambulatory referral to Hematology / Oncology  Amaurosis fugax Assessment & Plan: Currently on Eliquis 5 mg twice daily. Follow up with Cardiology as scheduled.     Return if symptoms worsen or fail to improve, for TOC.   Bethanie Dicker, NP-C [facility]

## 2023-12-02 NOTE — Assessment & Plan Note (Signed)
 Left side, sharp and intermittent since Saturday. Improved today. UA negative in office. Microscopic and culture pending. Will contact patient with results. Hx of ovarian cysts, discussed pain could have been associated with cyst. She has an upcoming appt and [place] with her Ob-Gyn. Return precautions given to patient.

## 2023-12-02 NOTE — Assessment & Plan Note (Addendum)
 Currently on Eliquis 5 mg twice daily. Follow up with Cardiology as scheduled.

## 2023-12-03 ENCOUNTER — Ambulatory Visit: Admitting: Family

## 2023-12-03 LAB — URINE CULTURE
MICRO NUMBER:: 16156601
Result:: NO GROWTH
SPECIMEN QUALITY:: ADEQUATE

## 2023-12-04 ENCOUNTER — Encounter: Payer: Self-pay | Admitting: Nurse Practitioner

## 2023-12-04 DIAGNOSIS — M3508 Sjogren syndrome with gastrointestinal involvement: Secondary | ICD-10-CM | POA: Diagnosis not present

## 2023-12-04 DIAGNOSIS — K743 Primary biliary cirrhosis: Secondary | ICD-10-CM | POA: Diagnosis not present

## 2023-12-04 DIAGNOSIS — M159 Polyosteoarthritis, unspecified: Secondary | ICD-10-CM | POA: Diagnosis not present

## 2023-12-04 DIAGNOSIS — M3506 Sjogren syndrome with peripheral nervous system involvement: Secondary | ICD-10-CM | POA: Diagnosis not present

## 2023-12-04 DIAGNOSIS — G63 Polyneuropathy in diseases classified elsewhere: Secondary | ICD-10-CM | POA: Diagnosis not present

## 2023-12-05 ENCOUNTER — Other Ambulatory Visit (INDEPENDENT_AMBULATORY_CARE_PROVIDER_SITE_OTHER)

## 2023-12-05 ENCOUNTER — Inpatient Hospital Stay: Attending: Oncology | Admitting: Oncology

## 2023-12-05 ENCOUNTER — Inpatient Hospital Stay

## 2023-12-05 VITALS — BP 130/73 | HR 81 | Temp 97.7°F | Wt 169.8 lb

## 2023-12-05 DIAGNOSIS — M3500 Sicca syndrome, unspecified: Secondary | ICD-10-CM | POA: Diagnosis not present

## 2023-12-05 DIAGNOSIS — C50919 Malignant neoplasm of unspecified site of unspecified female breast: Secondary | ICD-10-CM | POA: Diagnosis not present

## 2023-12-05 DIAGNOSIS — R899 Unspecified abnormal finding in specimens from other organs, systems and tissues: Secondary | ICD-10-CM | POA: Diagnosis not present

## 2023-12-05 DIAGNOSIS — I4891 Unspecified atrial fibrillation: Secondary | ICD-10-CM | POA: Diagnosis not present

## 2023-12-05 DIAGNOSIS — Z86000 Personal history of in-situ neoplasm of breast: Secondary | ICD-10-CM | POA: Insufficient documentation

## 2023-12-05 DIAGNOSIS — Z803 Family history of malignant neoplasm of breast: Secondary | ICD-10-CM | POA: Insufficient documentation

## 2023-12-05 DIAGNOSIS — Z87891 Personal history of nicotine dependence: Secondary | ICD-10-CM | POA: Insufficient documentation

## 2023-12-05 DIAGNOSIS — Z9221 Personal history of antineoplastic chemotherapy: Secondary | ICD-10-CM | POA: Diagnosis not present

## 2023-12-05 DIAGNOSIS — Z800 Family history of malignant neoplasm of digestive organs: Secondary | ICD-10-CM | POA: Insufficient documentation

## 2023-12-05 DIAGNOSIS — Z7901 Long term (current) use of anticoagulants: Secondary | ICD-10-CM | POA: Insufficient documentation

## 2023-12-05 DIAGNOSIS — R760 Raised antibody titer: Secondary | ICD-10-CM | POA: Insufficient documentation

## 2023-12-05 NOTE — Telephone Encounter (Signed)
 Lvm for pt to give office a call in regards to Dr. Roby Lofts recommendations. Okay to relay message, if message is relayed please notify office

## 2023-12-05 NOTE — Telephone Encounter (Signed)
 Her urinalysis revealed a few squamous epithelial cells  - this is just skin cells.  The urinalysis does not reveal anything that looks like an infection. If she is having new symptoms (these symptoms were not reported at her appt) and is concerned about a possible UTI, then I would recommend reevaluation with repeat urine and possible vaginal exam - to confirm what is causing her symptoms.

## 2023-12-05 NOTE — Telephone Encounter (Signed)
 Spoke to pt. Pt notified of Dr. Roby Lofts recommendations. Pt verbalized understanding and stated that she would give [place] a call back to schedule if need be. Informed pt if her symptoms get worse to try urgent care. Pt agreed.

## 2023-12-06 LAB — BASIC METABOLIC PANEL
BUN: 22 mg/dL (ref 7–25)
CO2: 26 mmol/L (ref 20–32)
Calcium: 9.5 mg/dL (ref 8.6–10.4)
Chloride: 95 mmol/L — ABNORMAL LOW (ref 98–110)
Creat: 0.79 mg/dL (ref 0.50–1.05)
Glucose, Bld: 235 mg/dL — ABNORMAL HIGH (ref 65–99)
Potassium: 4.3 mmol/L (ref 3.5–5.3)
Sodium: 133 mmol/L — ABNORMAL LOW (ref 135–146)

## 2023-12-06 NOTE — Progress Notes (Unsigned)
 Hematology/Oncology Progress note [phone] [phone]      Patient Care Team: Glori Luis, MD (Inactive) as PCP - General (Family Medicine) End, Cristal Deer, MD as PCP - Cardiology (Cardiology) Rickard Patience, MD as Consulting Physician (Oncology)  REFERRING PROVIDER: Bethanie Dicker, NP  CHIEF COMPLAINTS/REASON FOR VISIT:  Re-establish care for positive anticardiolipin IgM  HISTORY OF PRESENTING ILLNESS:   Kristina Mccoy is a  68 y.o.  female with PMH listed below was seen in consultation at the request of  Bethanie Dicker, NP  for evaluation of  positive anticardiolipin IgM  Patient was seen by [facility] rheumatology team for positive ANA, weakness, rash, polyarthralgia, dry eye syndrome. She had rheumatology work-up including antiphospholipid panel. 06/19/2020, anticardiolipin IgM 24, normal anticardiolipin IgG and IgA level. Negative anti beta 2 glycoprotein 1IgG, IgA, IgM levels are normal.   DRVV normal.  At the time patient had no history of any previous history of VTE.  She was eventually diagnosed with Sjogren's syndrome. 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   01/2013: Pt noted palpable right breast mass upper outer quadrant -> mammogram abnl -> Bx c/w IDC, ER/PR positive and HER2/neu negative.  03/23/13: Bilateral mastectomy + axillary LN dissection   04/23/13 - 06/04/13: Cycles #1-4 AC (Doxorubicin 60 mg/m2, Cyclophosphamide 600 mg/m2) q2wk   06/18/13 - 08/20/13: Cycles #1-3 Taxol (Paclitaxel 80 mg/m2 days 1, 8, 15, 22); Cycle #3 delayed by 1 wk and 20% dose reduction initiated due to neuropathy   09/2013 - 11/2013: XRT (with some dose delays)   12/29/13: Anastrozole 1 mg po qd (x2 wks only)  01/24/14: Switched to Letrozole 2.5 mg po qd  02/2014: Switched to Exemestane 25 mg po qd  05/2014: Switched to  Tamoxifen 20 mg po qd  Patient was last seen by [facility] oncology group on 06/25/2018. Patient struggled with all 3 aromatase inhibitors and was switched to tamoxifen.  She was recommended to continue tamoxifen and follow-up in a year.  Patient then lost to follow-up and is not currently taking tamoxifen.  It was unclear whether she stopped tamoxifen.  She has had bilateral mastectomy.   She was seen by me in 2022. cardiolipin antibodies was felt to be nonspecific, especially when titration of 1:40. This did not meet criteria for diagnosis antiphospholipid disorders. Patient presents to re establish care today.  She reports a history of amaurosis fugax diagnosed in Nov 2024. She got cardiology work up and was recently found to have atrial fibrillation after wearing a heart monitor. She was started on anticoagulation with Eliquis. Patient has upcoming surgery for C3-C7. She is concerned about her thrombosis risk while Eliquis is held. She recalls her "clotting disorder" and would like to re -establish care here.  She also provide a history of biopsy of a lesion in her mouth and was told that it was a blood clot. I reviewed her records at [facility] via care everywhere.  05/06/2022 s/p biopsy of 3 mm well-circumscribed erythematous lesion right hard palate. Pathology showed mild inflammation and telangiectasia, calcified debris.     Review of Systems  Constitutional:  Negative for appetite change, chills, fatigue and fever.  HENT:   Negative for hearing loss and voice change.   Eyes:  Negative for eye problems.  Respiratory:  Negative for chest tightness and cough.   Cardiovascular:  Negative for chest  pain.       Intermittent chest discomfort  Gastrointestinal:  Negative for abdominal distention, abdominal pain and blood in stool.  Endocrine: Negative for hot flashes.  Genitourinary:  Negative for difficulty urinating and frequency.   Musculoskeletal:  Negative for arthralgias.       Chronic aches and pains   Skin:  Negative for itching and rash.  Neurological:  Negative for extremity weakness.  Hematological:  Negative for adenopathy.  Psychiatric/Behavioral:  Negative for confusion.     MEDICAL HISTORY:  Past Medical History:  Diagnosis Date   Abdominal pain, epigastric    Abnormal chest CT 09/08/2014   Allergic urticaria    Blood clotting disorder (HCC)    Breast cancer (HCC) 01/2013   left breast   Breast cancer (HCC) 02/2013   Dr Christell Constant ([facility])   Diabetes mellitus    Diverticulosis    Fibromyalgia    GERD (gastroesophageal reflux disease)    Heart murmur    Hyperlipidemia    Lymphedema of arm    right   MI (myocardial infarction) (HCC)    cardiac MRI 3/2022There is a small subendocardial infarction (< 25% transmural) involving the basal-to-mid inferolateral wall, which is consistent with the distribution of a posterolateral branch of the RCA or LCx (depending on the pattern of coronary dominance).   Myalgia    Myositis    Neuromyositis    Otitis media, chronic    Pain syndrome, chronic    Peptic ulcer    Sicca syndrome (HCC)    Sjogren's disease (HCC)    Sleep apnea    SOB (shortness of breath)    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HCC)     SURGICAL HISTORY: Past Surgical History:  Procedure Laterality Date   BREAST SURGERY Bilateral    mastectomy   COLONOSCOPY WITH PROPOFOL N/A 04/22/2017   Procedure: COLONOSCOPY WITH PROPOFOL;  Surgeon: Christena Deem, MD;  Location: [facility] ENDOSCOPY;  Service: Endoscopy;  Laterality: N/A;   ESOPHAGOGASTRODUODENOSCOPY (EGD) WITH PROPOFOL N/A 04/22/2017   Procedure: ESOPHAGOGASTRODUODENOSCOPY (EGD) WITH PROPOFOL;  Surgeon: Christena Deem, MD;  Location: [facility] ENDOSCOPY;  Service: Endoscopy;  Laterality: N/A;   MASTECTOMY Bilateral 03/23/13    SOCIAL HISTORY: Social History   Socioeconomic History   Marital status: Single    Spouse name: Not on file   Number of children: Not on file   Years of education:  Not on file   Highest education level: Not on file  Occupational History   Occupation: disabled  Tobacco Use   Smoking status: Former    Current packs/day: 0.00    Average packs/day: 1 pack/day for 30.0 years (30.0 ttl pk-yrs)    Types: Cigarettes    Start date: 03/01/1983    Quit date: 02/28/2013    Years since quitting: 10.7   Smokeless tobacco: Never  Vaping Use   Vaping status: Never Used  Substance and Sexual Activity   Alcohol use: No    Alcohol/week: 0.0 standard drinks of alcohol   Drug use: No   Sexual activity: Never    Birth control/protection: Post-menopausal  Other Topics Concern   Not on file  Social History Narrative   Not on file   Social Drivers of Health   Financial Resource Strain: Medium Risk (11/07/2023)   Received from [facility]   Overall Financial Resource Strain (CARDIA)    Difficulty of Paying Living Expenses: Somewhat hard  Food Insecurity: No Food Insecurity (12/05/2023)   Hunger Vital Sign  Worried About Programme researcher, broadcasting/film/video in the Last Year: Never true    Ran Out of Food in the Last Year: Never true  Transportation Needs: No Transportation Needs (11/07/2023)   Received from [facility] - Transportation    In the past 12 months, has lack of transportation kept you from medical appointments or from getting medications?: No    Lack of Transportation (Non-Medical): No  Physical Activity: Not on file  Stress: Not on file  Social Connections: Not on file  Intimate Partner Violence: Not At Risk (12/05/2023)   Humiliation, Afraid, Rape, and Kick questionnaire    Fear of Current or Ex-Partner: No    Emotionally Abused: No    Physically Abused: No    Sexually Abused: No    FAMILY HISTORY: Family History  Problem Relation Age of Onset   Diabetes Mother    Heart disease Mother    Hyperlipidemia Mother    Heart failure Mother    Diabetes Other    Pancreatic cancer Father    Heart disease Brother         Aortic valve disease   Hyperlipidemia Brother    Colon polyps Brother    Stroke Maternal Grandmother    Breast cancer Maternal Aunt    Colon cancer Paternal Grandmother    Rheum arthritis Sister    Psoriasis Other        psoriatic arthritis    Heart disease Brother 51       Tachycardia   Heart disease Brother    Ovarian cancer Neg Hx     ALLERGIES:  is allergic to gabapentin, sodium fluoride, wound dressing adhesive, oxycodone, repatha [evolocumab], carbamazepine, doxycycline, dulaglutide, ezetimibe, insulin glargine, levaquin [levofloxacin], metformin, methylprednisolone, mirtazapine, morphine, pregabalin, rosuvastatin, venlafaxine, buprenorphine, canagliflozin, escitalopram, and hydroxychloroquine.  MEDICATIONS:  Current Outpatient Medications  Medication Sig Dispense Refill   albuterol (PROVENTIL) (2.5 MG/3ML) 0.083% nebulizer solution Take 3 mLs (2.5 mg total) by nebulization every 4 (four) hours as needed for wheezing or shortness of breath. 75 mL 5   Albuterol-Budesonide (AIRSUPRA) 90-80 MCG/ACT AERO Inhale 2 puffs into the lungs every 4 (four) hours as needed (wheezing or shortness of breath). 10.7 g 1   ALPRAZolam (XANAX) 0.5 MG tablet TAKE 1 TABLET BY MOUTH 4 TIMES A DAY AS NEEDED FOR ANXIETY 120 tablet 2   azelastine (ASTELIN) 0.1 % nasal spray Place 2 sprays into both nostrils 2 (two) times daily. Use in each nostril as directed 30 mL 2   baclofen (LIORESAL) 10 MG tablet Take 1 tablet (10 mg total) by mouth 3 (three) times daily as needed for muscle spasms. 90 tablet 1   candesartan (ATACAND) 8 MG tablet Take 4 mg by mouth daily.     Continuous Blood Gluc Sensor (FREESTYLE LIBRE 14 DAY SENSOR) MISC SMARTSIG:1 Each Topical Every 2 Weeks     diclofenac Sodium (VOLTAREN) 1 % GEL Apply 1 Application topically 4 (four) times daily.     ELIQUIS 5 MG TABS tablet Take 5 mg by mouth 2 (two) times daily.     Emollient (CERAVE) CREA Apply topically daily.      ezetimibe (ZETIA) 10 MG  tablet Take 10 mg by mouth daily.     fluticasone-salmeterol (WIXELA INHUB) 500-50 MCG/ACT AEPB Inhale 1 puff into the lungs in the morning and at bedtime. 60 each 0   HYDROcodone-acetaminophen (NORCO/VICODIN) 5-325 MG tablet Take 1 tablet by mouth every 6 (six) hours as needed for moderate pain (  pain score 4-6). 15 tablet 0   hydrocortisone 2.5 % cream APPLY TO AFFECTED AREA TWICE A DAY     insulin aspart (NOVOLOG) 100 UNIT/ML FlexPen Inject 12 Units into the skin 3 (three) times daily with meals. (Patient taking differently: Inject 12 Units into the skin 3 (three) times daily with meals. PER sliding scale) 15 mL 3   Insulin Pen Needle 32G X 6 MM MISC Use 3 times daily with meals. 1 each 3   ketoconazole (NIZORAL) 2 % cream Apply 1 application topically 2 (two) times daily as needed.  3   ketoconazole (NIZORAL) 2 % shampoo Apply 1 application  topically as needed.  11   Lancets (ONETOUCH DELICA PLUS LANCET30G) MISC 4 (four) times daily.     metoprolol succinate (TOPROL-XL) 25 MG 24 hr tablet Take 1 tablet (25 mg total) by mouth daily. 90 tablet 3   ondansetron (ZOFRAN) 4 MG tablet TAKE 1 TABLET BY MOUTH EVERY 8 HOURS AS NEEDED FOR NAUSEA AND VOMITING 18 tablet 1   ONETOUCH ULTRA test strip 4 (four) times daily. for testing     pantoprazole (PROTONIX) 40 MG tablet Take 1 tablet (40 mg total) by mouth daily. 90 tablet 1   pseudoephedrine-guaifenesin (MUCINEX D) 60-600 MG 12 hr tablet Take 1 tablet by mouth 2 (two) times daily as needed.      TRESIBA FLEXTOUCH 200 UNIT/ML FlexTouch Pen Inject 10 Units into the skin daily.     triamcinolone (NASACORT) 55 MCG/ACT AERO nasal inhaler Place 2 sprays into the nose daily.     ursodiol (ACTIGALL) 500 MG tablet Take 1,000 mg by mouth daily.     Glucagon 3 MG/DOSE POWD 1 spray into the left nostril every fifteen (15) minutes as needed (low blood sugar requiring assistance). (Patient not taking: Reported on 12/05/2023)     No current facility-administered  medications for this visit.     PHYSICAL EXAMINATION: ECOG PERFORMANCE STATUS: 1 - Symptomatic but completely ambulatory Vitals:   12/05/23 1147  BP: 130/73  Pulse: 81  Temp: 97.7 F (36.5 C)  SpO2: 100%   Filed Weights   12/05/23 1147  Weight: 169 lb 12.8 oz (77 kg)    Physical Exam Constitutional:      General: She is not in acute distress. HENT:     Head: Normocephalic and atraumatic.  Eyes:     General: No scleral icterus. Cardiovascular:     Rate and Rhythm: Normal rate and regular rhythm.     Heart sounds: Normal heart sounds.  Pulmonary:     Effort: Pulmonary effort is normal. No respiratory distress.     Breath sounds: No wheezing.  Abdominal:     General: Bowel sounds are normal. There is no distension.     Palpations: Abdomen is soft.     Comments: Right upper quadrant tenderness with palpation.  Musculoskeletal:        General: No deformity. Normal range of motion.     Cervical back: Normal range of motion and neck supple.  Skin:    General: Skin is warm and dry.     Findings: No erythema or rash.  Neurological:     Mental Status: She is alert and oriented to person, place, and time. Mental status is at baseline.     Cranial Nerves: No cranial nerve deficit.     Coordination: Coordination normal.  Psychiatric:        Mood and Affect: Mood normal.     LABORATORY DATA:  I have reviewed the data as listed Lab Results  Component Value Date   WBC 5.8 10/31/2020   HGB 14.4 10/31/2020   HCT 42.2 10/31/2020   MCV 91.4 10/31/2020   PLT 179.0 10/31/2020   Recent Labs    12/25/22 1412 12/02/23 1121 12/05/23 1444  NA 132* 125* 133*  K 3.8 4.6 4.3  CL 94* 92* 95*  CO2 29 28 26   GLUCOSE 159* 210* 235*  BUN 15 21 22   CREATININE 0.85 0.73 0.79  CALCIUM 9.3 9.2 9.5   Iron/TIBC/Ferritin/ %Sat    Component Value Date/Time   FERRITIN 111.0 12/04/2015 8295      RADIOGRAPHIC STUDIES: I have personally reviewed the radiological images as listed  and agreed with the findings in the report. No results found.    ASSESSMENT & PLAN:  No diagnosis found.  Previous labs reviewed and discussed with patient.  She has never had a personal history of VTE I explained to patient that cardiolipin antibodies can be nonspecific, especially when titration of 1:40. This does not meet criteria for diagnosis antiphospholipid disorders. I do not feel patient is at risk for antiphospholipid antibody-related disorders beyond the risk conferred by their medical histories. Repeat testing is warranted with new or recurrent clinical symptoms. Patient appreciates explanation.  History of breast cancer, stage III, ER/PR positive and HER2 negative patient had bilateral mastectomy and axillary lymph node dissection followed by adjuvant chemotherapy with AC-> Taxol, followed by about 5 years of antiestrogen treatments.  She struggled with all 3 aromatase inhibitors and was eventually switched to tamoxifen.  Lost follow-up with [facility] oncology team. Recommend patient to reestablish care with [facility] oncology for follow-up  I will discharge patient today back to follow-up with primary care provider.  Encouraged her to continue follow-up with [facility] oncology.  She may reestablish care with me if she prefers to transfer her oncology care to [facility] cancer center. All questions were answered. The patient knows to call the clinic with any problems questions or concerns.  cc Bethanie Dicker, NP   Thank you for this kind referral and the opportunity to participate in the care of this patient. A copy of today's note is routed to referring provider    Rickard Patience, MD, PhD Hematology Oncology [facility] at [facility] Pager- [phone] 12/06/2023

## 2023-12-07 ENCOUNTER — Encounter: Payer: Self-pay | Admitting: Oncology

## 2023-12-07 ENCOUNTER — Other Ambulatory Visit: Payer: Self-pay | Admitting: Neurosurgery

## 2023-12-07 DIAGNOSIS — R760 Raised antibody titer: Secondary | ICD-10-CM | POA: Insufficient documentation

## 2023-12-07 DIAGNOSIS — I4891 Unspecified atrial fibrillation: Secondary | ICD-10-CM | POA: Insufficient documentation

## 2023-12-07 NOTE — Assessment & Plan Note (Signed)
 History of breast cancer, stage III, ER/PR positive and HER2 negative patient had bilateral mastectomy and axillary lymph node dissection followed by adjuvant chemotherapy with AC-> Taxol, followed by about 5 years of antiestrogen treatments.  She struggled with all 3 aromatase inhibitors and was eventually switched to tamoxifen.  Last seen by [facility] oncology team in June 2023.Kristina Mccoy

## 2023-12-07 NOTE — Assessment & Plan Note (Signed)
 Previous labs reviewed and discussed with patient.  She has never had a personal history of VTE 9/200/2021  history of low-moderate increase titer of anticardiolipin IgM 24 I explained to patient that low titer [<40] cardiolipin antibodies are of unknown significance and is not associated with increased thrombosis risk.  03/30/2021 repeat anticardiolipin IgM 9, within normal limits.   She does not have antiphospholipid syndrome. I do not feel patient is at risk for thrombosis beyond the risk conferred by her medical problems, ie A -Fib. Recent episode amaurosis fugax is likely due to arterial thrombosis due to A Fib.  Patient appreciates explanation.

## 2023-12-07 NOTE — Assessment & Plan Note (Signed)
 Continue Eliquis 5mg  BID and continue follow up with cardiology

## 2023-12-08 ENCOUNTER — Encounter: Payer: Self-pay | Admitting: Nurse Practitioner

## 2023-12-10 ENCOUNTER — Telehealth: Payer: Self-pay

## 2023-12-10 NOTE — Telephone Encounter (Signed)
 Spoke to pt to inform her medication is ready for pick up.   Tresiba 1 box lot: [id number] exp: 02/27/26  Novolog: 2 boxes lot: [id number] exp: 01/27/26

## 2023-12-11 NOTE — Telephone Encounter (Signed)
Pt has picked up medication.  

## 2023-12-16 ENCOUNTER — Encounter: Payer: Self-pay | Admitting: Family Medicine

## 2023-12-16 ENCOUNTER — Ambulatory Visit (INDEPENDENT_AMBULATORY_CARE_PROVIDER_SITE_OTHER): Admitting: Family Medicine

## 2023-12-16 VITALS — BP 132/82 | HR 77 | Temp 97.3°F | Resp 18 | Ht 64.0 in | Wt 170.0 lb

## 2023-12-16 DIAGNOSIS — M5412 Radiculopathy, cervical region: Secondary | ICD-10-CM | POA: Diagnosis not present

## 2023-12-16 DIAGNOSIS — C50912 Malignant neoplasm of unspecified site of left female breast: Secondary | ICD-10-CM

## 2023-12-16 DIAGNOSIS — C439 Malignant melanoma of skin, unspecified: Secondary | ICD-10-CM

## 2023-12-16 DIAGNOSIS — I480 Paroxysmal atrial fibrillation: Secondary | ICD-10-CM | POA: Diagnosis not present

## 2023-12-16 DIAGNOSIS — Z170 Estrogen receptor positive status [ER+]: Secondary | ICD-10-CM

## 2023-12-16 DIAGNOSIS — I10 Essential (primary) hypertension: Secondary | ICD-10-CM | POA: Diagnosis not present

## 2023-12-16 DIAGNOSIS — R21 Rash and other nonspecific skin eruption: Secondary | ICD-10-CM | POA: Diagnosis not present

## 2023-12-16 DIAGNOSIS — K743 Primary biliary cirrhosis: Secondary | ICD-10-CM | POA: Diagnosis not present

## 2023-12-16 DIAGNOSIS — E785 Hyperlipidemia, unspecified: Secondary | ICD-10-CM

## 2023-12-16 DIAGNOSIS — E1069 Type 1 diabetes mellitus with other specified complication: Secondary | ICD-10-CM

## 2023-12-16 DIAGNOSIS — M3500 Sicca syndrome, unspecified: Secondary | ICD-10-CM

## 2023-12-16 DIAGNOSIS — C50911 Malignant neoplasm of unspecified site of right female breast: Secondary | ICD-10-CM | POA: Diagnosis not present

## 2023-12-16 NOTE — Assessment & Plan Note (Signed)
 On Eliquis and metoprolol.  Cardiology to decide if she needs antiarrythmia drugs verses ablation depending on her AFIB burden.

## 2023-12-16 NOTE — Assessment & Plan Note (Addendum)
 Followed by Oncology.  Not taking a hormone blocking agent. Has taken Tamoxifen in the past.

## 2023-12-16 NOTE — Assessment & Plan Note (Signed)
 Seen by Dermatology twice yearly for skin checks.

## 2023-12-16 NOTE — Assessment & Plan Note (Signed)
 Causes many problems for her.  Has a full spectrum of symptoms from multiple organ systems.  Has flares and feels like she has the flu for 3-7 days at a time.

## 2023-12-16 NOTE — Progress Notes (Signed)
 New Patient Office Visit  Subjective    Patient ID: Kristina Mccoy, female    DOB: 04-14-1956  Age: 68 y.o. MRN: [id number]  CC:  Chief Complaint  Patient presents with   Rash    Face, chest, upper back. Started 12/11/23.    HPI [name] presents to establish care.  [name] 68 year old woman with Sjogren's/sicca syndrome (Dr. Misty Stanley Criscione-Schreiber), PAF (Confirmed on Zio patch, Dr. Marguarite Arbour), amaurosis fugax, PUD, DMT2, HTN, mixed hyperlipidemia, CAD (MI 2023 detected on cardiac nuclear scan), DDD C3-C7 with cord compression, melanoma, anxiety, breast cancer, (bilateral mastectomy, Dr. Nile Riggs), primary biliary cholangitis and interstitial lung disease (Dr. Ivor Messier).  Colonoscopy 04/22/2017 no polyps (Cologuard 1 year ago negative) .  She presented with amaurosis fugax.  Wore a 30-day monitor and atrial fibrillation was detected.  She is on Eliquis 5 mg twice daily for her PAF.  Rate controlled with metoprolol succinate 25 mg daily.  Depending on the burden of her PAF, cardiology may consider antiarrhythmic drugs versus ablation.  No black tarry stools, no  bright red blood per rectum. Sjogren syndrome flares every couple of months and she feels like she has the flu.  This will last 3 to 7 days.  She will get numerous flu test during this time and are always negative.  No current treatment for Sjogren's syndrome.  Failed a trial of Plaquenil.  Gets a photosensitive rash from Sjogren's.  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   Has considerable anxiety and takes Xanax 0.5 mg up to 4 times a day.  Has tried and failed SSRIs in the past. Breast cancer on the right but bilateral mastectomy.  Dr. Christell Constant at [facility] in [place].  Is not currently taking hormone blocking medication, she has taken Tamoxifen in the past.  Did receive postoperative radiation. History of melanoma and she is followed by dermatology twice a year for skin checks. 2015 she got double pneumonia.  She has  had one pneumonia vaccine this fall. Not certain which one she got. She has not had the Shingles vaccines.   Diabetes is controlled with NovoLog sliding scale at meals and Tresiba 10 units daily.  She has a Public librarian..  She gets patient assistance for the insulin and pen needles. Primary biliary cholangitis diagnosed in 2017.  On Ursodiol. Followed by GI.   She gets downward spirals of physical conditioning and emotional. She denies having depression.  Is active with a [employer] and support groups.  Lives alone.  Has a friend she speaks with daily.  Feels cut off from the world because she does not accept invitations because she never knows how she will feel and she does not want to cancel.    Biggest problem she has now is that she is exhausted.  She refers to this as "lead fatigue".  Getting up in the morning and getting ready takes more than 2 hours.  She has to rest a lot and sit down often.  She does not feel short of breath necessarily and she denies chest pain or palpitations.  It is more of an overwhelming sense of fatigue. Arm and leg fatigue.   She has a rash on her forehead and cheeks as well as her anterior chest wall that burns and itches.  She tried Benadryl tablet.  It did not help very much and it left her wiped out the next day. She has DDD C3-C7.  She needs surgery for this and cannot have surgery  until the AFIB problem is corrected.    Outpatient Encounter Medications as of 12/16/2023  Medication Sig   albuterol (PROVENTIL) (2.5 MG/3ML) 0.083% nebulizer solution Take 3 mLs (2.5 mg total) by nebulization every 4 (four) hours as needed for wheezing or shortness of breath.   Albuterol-Budesonide (AIRSUPRA) 90-80 MCG/ACT AERO Inhale 2 puffs into the lungs every 4 (four) hours as needed (wheezing or shortness of breath).   ALPRAZolam (XANAX) 0.5 MG tablet TAKE 1 TABLET BY MOUTH 4 TIMES A DAY AS NEEDED FOR ANXIETY   azelastine (ASTELIN) 0.1 % nasal spray Place 2 sprays  into both nostrils 2 (two) times daily. Use in each nostril as directed   baclofen (LIORESAL) 10 MG tablet Take 1 tablet (10 mg total) by mouth 3 (three) times daily as needed for muscle spasms.   candesartan (ATACAND) 8 MG tablet Take 4 mg by mouth daily.   Continuous Blood Gluc Sensor (FREESTYLE LIBRE 14 DAY SENSOR) MISC SMARTSIG:1 Each Topical Every 2 Weeks   diclofenac Sodium (VOLTAREN) 1 % GEL Apply 1 Application topically 4 (four) times daily.   ELIQUIS 5 MG TABS tablet Take 5 mg by mouth 2 (two) times daily.   Emollient (CERAVE) CREA Apply topically daily.    ezetimibe (ZETIA) 10 MG tablet Take 10 mg by mouth daily.   fluticasone-salmeterol (WIXELA INHUB) 500-50 MCG/ACT AEPB Inhale 1 puff into the lungs in the morning and at bedtime.   Glucagon 3 MG/DOSE POWD    HYDROcodone-acetaminophen (NORCO/VICODIN) 5-325 MG tablet Take 1 tablet by mouth every 6 (six) hours as needed for moderate pain (pain score 4-6).   hydrocortisone 2.5 % cream APPLY TO AFFECTED AREA TWICE A DAY   insulin aspart (NOVOLOG) 100 UNIT/ML FlexPen Inject 12 Units into the skin 3 (three) times daily with meals. (Patient taking differently: Inject 5 Units into the skin 3 (three) times daily with meals. PER sliding scale)   Insulin Pen Needle 32G X 6 MM MISC Use 3 times daily with meals.   ketoconazole (NIZORAL) 2 % cream Apply 1 application topically 2 (two) times daily as needed.   ketoconazole (NIZORAL) 2 % shampoo Apply 1 application  topically as needed.   Lancets (ONETOUCH DELICA PLUS LANCET30G) MISC 4 (four) times daily.   metoprolol succinate (TOPROL-XL) 25 MG 24 hr tablet Take 1 tablet (25 mg total) by mouth daily.   ondansetron (ZOFRAN) 4 MG tablet TAKE 1 TABLET BY MOUTH EVERY 8 HOURS AS NEEDED FOR NAUSEA AND VOMITING   ONETOUCH ULTRA test strip 4 (four) times daily. for testing   pantoprazole (PROTONIX) 40 MG tablet Take 1 tablet (40 mg total) by mouth daily.   pseudoephedrine-guaifenesin (MUCINEX D) 60-600 MG 12  hr tablet Take 1 tablet by mouth 2 (two) times daily as needed.    TRESIBA FLEXTOUCH 200 UNIT/ML FlexTouch Pen Inject 10 Units into the skin daily.   triamcinolone (NASACORT) 55 MCG/ACT AERO nasal inhaler Place 2 sprays into the nose daily.   ursodiol (ACTIGALL) 500 MG tablet Take 1,000 mg by mouth daily.   No facility-administered encounter medications on file as of 12/16/2023.    Past Medical History:  Diagnosis Date   Abdominal pain, epigastric    Abnormal chest CT 09/08/2014   Allergic urticaria    Blood clotting disorder (HCC)    Breast cancer (HCC) 01/2013   left breast   Breast cancer (HCC) 02/2013   Dr Christell Constant ([facility])   Diabetes mellitus    Diverticulosis    Fibromyalgia  GERD (gastroesophageal reflux disease)    Heart murmur    Hyperlipidemia    Lymphedema of arm    right   MI (myocardial infarction) (HCC)    cardiac MRI 3/2022There is a small subendocardial infarction (< 25% transmural) involving the basal-to-mid inferolateral wall, which is consistent with the distribution of a posterolateral branch of the RCA or LCx (depending on the pattern of coronary dominance).   Myalgia    Myositis    Neuromyositis    Otitis media, chronic    Pain syndrome, chronic    Peptic ulcer    Sicca syndrome (HCC)    Sjogren's disease (HCC)    Sleep apnea    SOB (shortness of breath)    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HCC)     Past Surgical History:  Procedure Laterality Date   BREAST SURGERY Bilateral    mastectomy   COLONOSCOPY WITH PROPOFOL N/A 04/22/2017   Procedure: COLONOSCOPY WITH PROPOFOL;  Surgeon: Christena Deem, MD;  Location[facility] ENDOSCOPY;  Service: Endoscopy;  Laterality: N/A;   ESOPHAGOGASTRODUODENOSCOPY (EGD) WITH PROPOFOL N/A 04/22/2017   Procedure: ESOPHAGOGASTRODUODENOSCOPY (EGD) WITH PROPOFOL;  Surgeon: Christena Deem, MD;  Location: [facility] ENDOSCOPY;  Service: Endoscopy;  Laterality: N/A;   MASTECTOMY Bilateral 03/23/13    Family  History  Problem Relation Age of Onset   Diabetes Mother    Heart disease Mother    Hyperlipidemia Mother    Heart failure Mother    Diabetes Other    Pancreatic cancer Father    Heart disease Brother        Aortic valve disease   Hyperlipidemia Brother    Colon polyps Brother    Stroke Maternal Grandmother    Breast cancer Maternal Aunt    Colon cancer Paternal Grandmother    Rheum arthritis Sister    Psoriasis Other        psoriatic arthritis    Heart disease Brother 51       Tachycardia   Heart disease Brother    Ovarian cancer Neg Hx     Social History   Socioeconomic History   Marital status: Single    Spouse name: Not on file   Number of children: Not on file   Years of education: Not on file   Highest education level: Not on file  Occupational History   Occupation: disabled  Tobacco Use   Smoking status: Former    Current packs/day: 0.00    Average packs/day: 1 pack/day for 30.0 years (30.0 ttl pk-yrs)    Types: Cigarettes    Start date: 03/01/1983    Quit date: 02/28/2013    Years since quitting: 10.8   Smokeless tobacco: Never  Vaping Use   Vaping status: Never Used  Substance and Sexual Activity   Alcohol use: No    Alcohol/week: 0.0 standard drinks of alcohol   Drug use: No   Sexual activity: Never    Birth control/protection: Post-menopausal  Other Topics Concern   Not on file  Social History Narrative   Not on file   Social Drivers of Health   Financial Resource Strain: Medium Risk (11/07/2023)   [facility] from [facility]   Overall Financial Resource Strain (CARDIA)    Difficulty of Paying Living Expenses: Somewhat hard  Food Insecurity: No Food Insecurity (12/05/2023)   Hunger Vital Sign    Worried About Running Out of Food in the Last Year: Never true    Ran Out of Food in the Last  Year: Never true  Transportation Needs: No Transportation Needs (11/07/2023)   Received from [facility] -  Transportation    In the past 12 months, has lack of transportation kept you from medical appointments or from getting medications?: No    Lack of Transportation (Non-Medical): No  Physical Activity: Not on file  Stress: Not on file  Social Connections: Not on file  Intimate Partner Violence: Not At Risk (12/05/2023)   Humiliation, Afraid, Rape, and Kick questionnaire    Fear of Current or Ex-Partner: No    Emotionally Abused: No    Physically Abused: No    Sexually Abused: No    ROS      Objective   BP 132/82 (BP Location: Left Arm, Patient Position: Sitting, Cuff Size: Normal)   Pulse 77   Temp (!) 97.3 F (36.3 C) (Oral)   Resp 18   Ht 5\' 4"  (1.626 m)   Wt 170 lb (77.1 kg)   SpO2 98%   BMI 29.18 kg/m    Physical Exam Vitals and nursing note reviewed.  Constitutional:      Appearance: Normal appearance.  HENT:     Head: Normocephalic and atraumatic.  Eyes:     Conjunctiva/sclera: Conjunctivae normal.  Cardiovascular:     Rate and Rhythm: Normal rate and regular rhythm.  Pulmonary:     Effort: Pulmonary effort is normal.     Breath sounds: Normal breath sounds.  Musculoskeletal:     Right lower leg: No edema.     Left lower leg: No edema.  Skin:    General: Skin is warm and dry.     Comments: Erythematous macular rash on forehead, cheeks, anterior chest wall.  Neurological:     Mental Status: She is alert and oriented to person, place, and time.  Psychiatric:        Mood and Affect: Mood normal.        Behavior: Behavior normal.        Thought Content: Thought content normal.        Judgment: Judgment normal.       Diabetic foot exam was performed with the following findings:   No deformities, ulcerations, or other skin breakdown Normal sensation of 10g monofilament Intact posterior tibialis and dorsalis pedis pulses        The ASCVD Risk score (Arnett DK, et al., 2019) failed to calculate for the following reasons:   Risk score cannot be  calculated because patient has a medical history suggesting prior/existing ASCVD     Assessment & Plan:  Paroxysmal atrial fibrillation (HCC) Assessment & Plan: On Eliquis and metoprolol.  Cardiology to decide if she needs antiarrythmia drugs verses ablation depending on her AFIB burden.     Cervical radiculopathy Assessment & Plan: Reports DDD C3-C7 and has been advised to have surgery as she has some cord compression.  Cannot have surgery until AFIB problem is resolved.  Has weakness in her arms and at times in her legs.     Hyperlipidemia, unspecified hyperlipidemia type Assessment & Plan: Statin intolerant,  Did not tolerate Repatha.  On Zetia only.  Goal is LDL 70 or less   Malignant neoplasm of right breast in female, estrogen receptor positive, unspecified site of breast [facility] Assessment & Plan: Followed by Oncology.  Not taking a hormone blocking agent. Has taken Tamoxifen in the past.      Melanoma of skin [facility]) Assessment & Plan: Seen by Dermatology twice yearly for  skin checks.     Primary biliary cholangitis [facility]) Assessment & Plan: Taking Ursodiol   Rash Assessment & Plan: Orlene Plum sun exposed areas.  May be the sun sensitivity rash of Sjorgen's.  Offered for her to see Dermatology.     Sjogren's syndrome, with unspecified organ involvement [facility]) Assessment & Plan: Causes many problems for her.  Has a full spectrum of symptoms from multiple organ systems.  Has flares and feels like she has the flu for 3-7 days at a time.     Type 1 diabetes mellitus with other specified complication [facility]) Assessment & Plan: Followed by Endocrinology.  Gets Novolog and Evaristo Bury through patient assistance program.  OK to move the delivery address to this office.     Hypertension, unspecified type Assessment & Plan: Well controlled and on Candasartan 8mg  daily     Return in about 3 months (around 03/17/2024), or if symptoms worsen or fail to improve.   Alease Medina,  MD

## 2023-12-16 NOTE — Assessment & Plan Note (Signed)
 Followed by Endocrinology.  Gets Novolog and Evaristo Bury through patient assistance program.  OK to move the delivery address to this office.

## 2023-12-16 NOTE — Assessment & Plan Note (Addendum)
 Statin intolerant,  Did not tolerate Repatha.  On Zetia only.  Goal is LDL 70 or less

## 2023-12-16 NOTE — Assessment & Plan Note (Signed)
 Taking Ursodiol

## 2023-12-16 NOTE — Assessment & Plan Note (Addendum)
 Reports DDD C3-C7 and has been advised to have surgery as she has some cord compression.  Cannot have surgery until AFIB problem is resolved.  Has weakness in her arms and at times in her legs.

## 2023-12-16 NOTE — Assessment & Plan Note (Addendum)
 Well controlled and on Candasartan 8mg  daily

## 2023-12-16 NOTE — Assessment & Plan Note (Signed)
 Ina sun exposed areas.  May be the sun sensitivity rash of Sjorgen's.  Offered for her to see Dermatology.

## 2023-12-17 ENCOUNTER — Encounter: Payer: Self-pay | Admitting: Family Medicine

## 2023-12-17 DIAGNOSIS — L218 Other seborrheic dermatitis: Secondary | ICD-10-CM | POA: Diagnosis not present

## 2023-12-24 ENCOUNTER — Other Ambulatory Visit: Payer: Self-pay | Admitting: Pharmacist

## 2023-12-24 NOTE — Progress Notes (Signed)
 Brief Telephone Documentation Reason for Call: Patient called requesting to speak to pharmacist regarding patient assistance  Summary of Call: Diabetes managed by [facility] Endocrinology. Recent visit with dose titration, as outlined below.  Also has gotten new PCP due to previous PCP parting from the practice.  Would like assistance communicating both of these changes to [employer].   Primary Care Provider: Dr. Cheri Kearns [facility] at [facility]  Endocrinology Visit: 12/09/23: Increase Novolog to 6 units subQ before meals + SSI. If 2-hour post-meal readings are persistently above 180 mg/dl after 7 days, increase dose by 1 unit. Titrate weekly until BG <180 after meals.  Continue Tresiba 11 units subQ daily.   Assessment/Plan: Dose and PCP Changes must be reflected upon the Novo Nordisk Medication Refill Form.  Form will be faxed to new PCP's clinic for completion.  Patient follows dosing based on [facility] prescriptions have been written intentionally vague given her frequent titration.  Inject up to 14 units of Tresiba once daily as instructed Novolog: Inject 5 units TIDAC + CF up to 6 units (MDD 33 unit)  Follow Up: Patient given direct line for further questions/concerns. Pharmacist ar Primary Care at [facility] looped in  Loree Fee, PharmD Clinical Pharmacist [facility] [phone]

## 2023-12-28 ENCOUNTER — Other Ambulatory Visit: Payer: Self-pay | Admitting: Internal Medicine

## 2023-12-28 DIAGNOSIS — J4520 Mild intermittent asthma, uncomplicated: Secondary | ICD-10-CM

## 2023-12-29 ENCOUNTER — Encounter

## 2023-12-30 DIAGNOSIS — I480 Paroxysmal atrial fibrillation: Secondary | ICD-10-CM | POA: Diagnosis not present

## 2023-12-30 DIAGNOSIS — G453 Amaurosis fugax: Secondary | ICD-10-CM | POA: Diagnosis not present

## 2023-12-30 DIAGNOSIS — I10 Essential (primary) hypertension: Secondary | ICD-10-CM | POA: Diagnosis not present

## 2023-12-30 DIAGNOSIS — I219 Acute myocardial infarction, unspecified: Secondary | ICD-10-CM | POA: Diagnosis not present

## 2023-12-30 DIAGNOSIS — I493 Ventricular premature depolarization: Secondary | ICD-10-CM | POA: Diagnosis not present

## 2023-12-30 DIAGNOSIS — E785 Hyperlipidemia, unspecified: Secondary | ICD-10-CM | POA: Diagnosis not present

## 2023-12-30 DIAGNOSIS — E1169 Type 2 diabetes mellitus with other specified complication: Secondary | ICD-10-CM | POA: Diagnosis not present

## 2024-01-09 DIAGNOSIS — H25813 Combined forms of age-related cataract, bilateral: Secondary | ICD-10-CM | POA: Diagnosis not present

## 2024-01-22 ENCOUNTER — Encounter: Payer: Self-pay | Admitting: Family Medicine

## 2024-01-22 DIAGNOSIS — J309 Allergic rhinitis, unspecified: Secondary | ICD-10-CM

## 2024-01-23 MED ORDER — AZELASTINE HCL 0.1 % NA SOLN: 2.0000 | Freq: Two times a day (BID) | NASAL | 2 refills | Status: DC

## 2024-01-29 ENCOUNTER — Encounter: Payer: Self-pay | Admitting: Family Medicine

## 2024-01-29 ENCOUNTER — Other Ambulatory Visit: Payer: Self-pay | Admitting: Internal Medicine

## 2024-01-29 DIAGNOSIS — F419 Anxiety disorder, unspecified: Secondary | ICD-10-CM

## 2024-01-29 DIAGNOSIS — J4520 Mild intermittent asthma, uncomplicated: Secondary | ICD-10-CM

## 2024-01-29 MED ORDER — ALPRAZOLAM 0.5 MG PO TABS: ORAL_TABLET | ORAL | 2 refills | Status: DC

## 2024-01-29 NOTE — Telephone Encounter (Signed)
 Last filled 01/02/24 for #120

## 2024-02-02 ENCOUNTER — Encounter: Payer: Self-pay | Admitting: Podiatry

## 2024-02-02 ENCOUNTER — Ambulatory Visit (INDEPENDENT_AMBULATORY_CARE_PROVIDER_SITE_OTHER): Payer: PPO | Admitting: Podiatry

## 2024-02-02 DIAGNOSIS — E109 Type 1 diabetes mellitus without complications: Secondary | ICD-10-CM

## 2024-02-02 DIAGNOSIS — B351 Tinea unguium: Secondary | ICD-10-CM | POA: Diagnosis not present

## 2024-02-02 DIAGNOSIS — D689 Coagulation defect, unspecified: Secondary | ICD-10-CM

## 2024-02-02 DIAGNOSIS — M79674 Pain in right toe(s): Secondary | ICD-10-CM | POA: Diagnosis not present

## 2024-02-02 DIAGNOSIS — M79675 Pain in left toe(s): Secondary | ICD-10-CM | POA: Diagnosis not present

## 2024-02-05 ENCOUNTER — Encounter: Payer: PPO | Admitting: Family Medicine

## 2024-02-07 NOTE — Progress Notes (Signed)
 Subjective:  Patient ID: Kristina Mccoy, female    DOB: Aug 07, 1956,  MRN: [id number]  68 y.o. female presents thick, elongated toenails R hallux which are tender when wearing enclosed shoe gear. Chief Complaint  Patient presents with   Diabetes    "A follow-up for nail care, foot care.  I have a lump on the bottom of my left foot.  Maybe she can give me a referral for a MRI." (I told her you could refer her to another one of our doctors for the lump)  Dr. Rodolfo Clan at [facility] - 11/24/2023;  A1c - 8.9    New problem(s): None   PCP is Ziglar, Susan K, MD.  Allergies  Allergen Reactions   Gabapentin  Itching and Swelling   Sodium Fluoride Rash    Burning sensation on lips and redness   Wound Dressing Adhesive Dermatitis, Itching and Rash    Ingredient - Acrylic   Oxycodone  Itching and Rash   Repatha  [Evolocumab ] Other (See Comments)    abdominal distention, headache, higher blood sugars, facial skin peeling   Carbamazepine Nausea And Vomiting and Other (See Comments)    Abdominal pain    Doxycycline  Other (See Comments)   Dulaglutide     Other reaction(s): Other (See Comments) Severe stomach pain   Ezetimibe Other (See Comments)    myalgias   Insulin  Glargine Other (See Comments)    BLOATING, MUSCLE/JOINT PAIN   Levaquin  [Levofloxacin ]     Diarrhea and pain in calf   Metformin     Other reaction(s): Other (See Comments) Cramping- abdominal pain   Methylprednisolone     Reddened face, puffy face    Mirtazapine     Morphine Other (See Comments)   Pregabalin  Swelling    Swelling in arms and legs   Rosuvastatin  Other (See Comments)    LFTs increased    Venlafaxine Other (See Comments)   Buprenorphine Rash and Swelling   Canagliflozin  Other (See Comments) and Rash    YEAST INFECTIONS   Escitalopram  Rash   Hydroxychloroquine Rash    Review of Systems: Negative except as noted in the HPI.   Objective:  Kristina Mccoy is a pleasant 68 y.o. female WD, WN in NAD. AAO x  3.  Vascular Examination: Capillary refill time <3 seconds b/l LE. Palpable pedal pulses b/l LE. Digital hair present b/l. No pedal edema b/l. Skin temperature gradient WNL b/l. No varicosities b/l. Aaron Aas  Dermatological Examination: Pedal skin with normal turgor, texture and tone b/l. No open wounds. No interdigital macerations b/l. Toenails right great toe thickened, discolored, dystrophic with subungual debris. There is pain on palpation to dorsal aspect of nailplates. Nondystrophic toenails 2-5 bilaterally and left great toe.Aaron Aas  Neurological Examination: Protective sensation intact with 10 gram monofilament b/l LE. Vibratory sensation intact b/l LE.   Musculoskeletal Examination: Muscle strength 5/5 to all lower extremity muscle groups bilaterally. Palpable fixed, firm subcutaneous lesions on plantar aspect of medial arch right foot consistent with plantar fibromatosis.  Radiographs: None  Last A1c:      09/08/2023   12:00 AM  Hemoglobin A1C  Hemoglobin-A1c 7.9         This result is from an external source.     Assessment:   1. Pain due to onychomycosis of toenails of both feet   2. Type 1 diabetes mellitus without complication (HCC)   3. Blood clotting disorder (HCC)     Plan:  -Consent given for treatment as described below: -Examined patient. -We discussed plantar fibromatosis.  She would like to defer further work up for now. -Mycotic toenails R hallux were debrided in length and girth with sterile nail nippers and dremel without iatrogenic bleeding. -Nondystrophic toenails trimmed 2-5 bilaterally and left great toe. -Patient/POA to call should there be question/concern in the interim.  Return in about 3 months (around 05/04/2024).  Kristina Mccoy, DPM      [facility] LOCATION: [address], [place] [address]                   Office [phone]   [facility] LOCATION: [address] [place],  [place] [address] Office [phone]

## 2024-02-10 ENCOUNTER — Ambulatory Visit (INDEPENDENT_AMBULATORY_CARE_PROVIDER_SITE_OTHER): Admitting: Family Medicine

## 2024-02-10 ENCOUNTER — Encounter: Payer: Self-pay | Admitting: Family Medicine

## 2024-02-10 VITALS — BP 137/79 | HR 84 | Temp 98.0°F | Resp 18 | Ht 64.0 in | Wt 176.0 lb

## 2024-02-10 DIAGNOSIS — M545 Low back pain, unspecified: Secondary | ICD-10-CM | POA: Diagnosis not present

## 2024-02-10 DIAGNOSIS — Z794 Long term (current) use of insulin: Secondary | ICD-10-CM | POA: Diagnosis not present

## 2024-02-10 DIAGNOSIS — E1069 Type 1 diabetes mellitus with other specified complication: Secondary | ICD-10-CM | POA: Diagnosis not present

## 2024-02-10 DIAGNOSIS — E1169 Type 2 diabetes mellitus with other specified complication: Secondary | ICD-10-CM

## 2024-02-10 DIAGNOSIS — R5383 Other fatigue: Secondary | ICD-10-CM | POA: Diagnosis not present

## 2024-02-10 LAB — POCT GLYCOSYLATED HEMOGLOBIN (HGB A1C): Hemoglobin A1C: 7.9 % — AB (ref 4.0–5.6)

## 2024-02-11 DIAGNOSIS — F329 Major depressive disorder, single episode, unspecified: Secondary | ICD-10-CM | POA: Diagnosis not present

## 2024-02-11 DIAGNOSIS — F4312 Post-traumatic stress disorder, chronic: Secondary | ICD-10-CM | POA: Diagnosis not present

## 2024-02-11 DIAGNOSIS — F411 Generalized anxiety disorder: Secondary | ICD-10-CM | POA: Diagnosis not present

## 2024-02-12 ENCOUNTER — Other Ambulatory Visit: Payer: Self-pay | Admitting: Family Medicine

## 2024-02-12 ENCOUNTER — Telehealth: Payer: Self-pay | Admitting: Family Medicine

## 2024-02-12 DIAGNOSIS — R5383 Other fatigue: Secondary | ICD-10-CM | POA: Diagnosis not present

## 2024-02-12 DIAGNOSIS — I10 Essential (primary) hypertension: Secondary | ICD-10-CM

## 2024-02-12 DIAGNOSIS — E1169 Type 2 diabetes mellitus with other specified complication: Secondary | ICD-10-CM | POA: Diagnosis not present

## 2024-02-12 DIAGNOSIS — Z794 Long term (current) use of insulin: Secondary | ICD-10-CM | POA: Diagnosis not present

## 2024-02-12 NOTE — Progress Notes (Signed)
 Established Patient Office Visit  Subjective   Patient ID: Kristina Mccoy, female    DOB: January 14, 1956  Age: 68 y.o. MRN: [id number]  Chief Complaint  Patient presents with   Neck Pain   Home Health    Would like a referral    Back Pain   Insomnia    HPI [name] 68 year old with Sjogren's syndrome/sicca syndrome (Dr. Edwina Gram Criscione-Schreiber), PAF (Dr. Maybelle Spatz), amaurosis fugax, PUD, DMT2, HTN, mixed hyperlipidemia, CAD (MI 2023 detected on cardiac nuclear scan), DDD C3-C7 with cord compression, melanoma, anxiety, breast cancer, (bilateral mastectomy, Dr. Johnette Naegeli), primary biliary cholangitis, interstitial lung disease (Dr. Squire Dyers).  Colonoscopy 04/22/2017, no polyps (Cologuard 1 year ago negative).   She has excruciating pain in her neck and her lower back.  It greatly limits the things that she is able to do.  Compounding her pain is her fatigue.  It limits what she is able to do every day.  She has to plan carefully because she runs out of energy so quickly.  It takes tremendous effort for her to do anything.  Tasks like laundry get put off because she does not have the energy to get them done.  She does not think that her fatigue is gotten worse after her A-fib. She asked if there is any medicine I can give her to help her sleep better.  Discussed trazodone  she reports she tried it before and it had too many cognitive effects and it also made her fatigued. Xanax  is working well for her anxiety.  She is talking to a therapist starting tomorrow.  She does breathing exercises which help her with anxiety.  She also reads scriptures and listens to music to help soothe her. A1c today is 7.9% which is down about a point from her last 1.  Pharmacy has been helping to increase her Horace Lye and work on her sliding scale.  She did gain 10 pounds this past month though. She is having cataract surgery June 12th.  She feels she is going to need help in her home June 13 to get her  eyedrops.  [facility] offers an in-home Senior care service and she would like to know if she qualifies for this. She would like a note to her apartment manager requesting that she be moved to a first-floor apartment so that she does not have to negotiate the stairs.     ROS    Objective:      BP 137/79 (BP Location: Left Arm, Patient Position: Sitting, Cuff Size: Normal)   Pulse 84   Temp 98 F (36.7 C) (Oral)   Resp 18   Ht 5\' 4"  (1.626 m)   Wt 176 lb (79.8 kg)   SpO2 98%   BMI 30.21 kg/m    Physical Exam Vitals and nursing note reviewed.  Constitutional:      Appearance: Normal appearance.  HENT:     Head: Normocephalic and atraumatic.  Eyes:     Conjunctiva/sclera: Conjunctivae normal.  Cardiovascular:     Rate and Rhythm: Normal rate and regular rhythm.  Pulmonary:     Effort: Pulmonary effort is normal.     Breath sounds: Normal breath sounds.  Musculoskeletal:     Right lower leg: No edema.     Left lower leg: No edema.  Skin:    General: Skin is warm and dry.  Neurological:     Mental Status: She is alert and oriented to person, place, and time.  Psychiatric:  Mood and Affect: Mood normal.        Behavior: Behavior normal.        Thought Content: Thought content normal.        Judgment: Judgment normal.          Results for orders placed or performed in visit on 02/10/24  POCT glycosylated hemoglobin (Hb A1C)  Result Value Ref Range   Hemoglobin A1C 7.9 (A) 4.0 - 5.6 %   HbA1c POC (<> result, manual entry)     HbA1c, POC (prediabetic range)     HbA1c, POC (controlled diabetic range)        The ASCVD Risk score (Arnett DK, et al., 2019) failed to calculate for the following reasons:   Risk score cannot be calculated because patient has a medical history suggesting prior/existing ASCVD    Assessment & Plan:  Type 1 diabetes mellitus with other specified complication (HCC) -     POCT glycosylated hemoglobin (Hb A1C)  Other  fatigue Assessment & Plan: She has a history of low sodium and drinks electrolyte waters to correct this.  She does not have a history of anemia but has not had her labs done recently.  No recent thyroid  panel either.  Will ask her for labs   Type 2 diabetes mellitus with other specified complication, with long-term current use of insulin  (HCC) Assessment & Plan: Hemoglobin A1c 7.9% today.  This is an improvement of 1%.  She has been working with pharmacy for adjustment of her Horace Lye and sliding scales.   Lumbar back pain Assessment & Plan: Reports climbing the stairs at her apartment is painful.  She would like an apartment on the first floor.  Wrote a note to her apartment management that she needs to change apartments.      Return in about 3 months (around 05/12/2024).    Kristina Mccoy K Elizbeth Posa, MD

## 2024-02-12 NOTE — Assessment & Plan Note (Signed)
 Reports climbing the stairs at her apartment is painful.  She would like an apartment on the first floor.  Wrote a note to her apartment management that she needs to change apartments.

## 2024-02-12 NOTE — Assessment & Plan Note (Signed)
 She has a history of low sodium and drinks electrolyte waters to correct this.  She does not have a history of anemia but has not had her labs done recently.  No recent thyroid  panel either.  Will ask her for labs

## 2024-02-12 NOTE — Telephone Encounter (Signed)
 Called patient and advised I would like to check her sodium and her cell counts because she is complaining of fatigue.  She could come by this afternoon at 230 to get her labs.

## 2024-02-12 NOTE — Assessment & Plan Note (Signed)
 Hemoglobin A1c 7.9% today.  This is an improvement of 1%.  She has been working with pharmacy for adjustment of her Horace Lye and sliding scales.

## 2024-02-13 ENCOUNTER — Ambulatory Visit: Payer: Self-pay | Admitting: Family Medicine

## 2024-02-13 LAB — CBC WITH DIFFERENTIAL/PLATELET
Basophils Absolute: 0 10*3/uL (ref 0.0–0.2)
Basos: 1 %
EOS (ABSOLUTE): 0.1 10*3/uL (ref 0.0–0.4)
Eos: 2 %
Hematocrit: 40.4 % (ref 34.0–46.6)
Hemoglobin: 13.3 g/dL (ref 11.1–15.9)
Immature Grans (Abs): 0 10*3/uL (ref 0.0–0.1)
Immature Granulocytes: 0 %
Lymphocytes Absolute: 1.8 10*3/uL (ref 0.7–3.1)
Lymphs: 23 %
MCH: 32.2 pg (ref 26.6–33.0)
MCHC: 32.9 g/dL (ref 31.5–35.7)
MCV: 98 fL — ABNORMAL HIGH (ref 79–97)
Monocytes Absolute: 0.7 10*3/uL (ref 0.1–0.9)
Monocytes: 8 %
Neutrophils Absolute: 5.2 10*3/uL (ref 1.4–7.0)
Neutrophils: 66 %
Platelets: 208 10*3/uL (ref 150–450)
RBC: 4.13 x10E6/uL (ref 3.77–5.28)
RDW: 14 % (ref 11.7–15.4)
WBC: 7.8 10*3/uL (ref 3.4–10.8)

## 2024-02-13 LAB — COMPREHENSIVE METABOLIC PANEL WITH GFR
ALT: 37 IU/L — ABNORMAL HIGH (ref 0–32)
AST: 37 IU/L (ref 0–40)
Albumin: 4.1 g/dL (ref 3.9–4.9)
Alkaline Phosphatase: 256 IU/L — ABNORMAL HIGH (ref 44–121)
BUN/Creatinine Ratio: 19 (ref 12–28)
BUN: 16 mg/dL (ref 8–27)
Bilirubin Total: 0.3 mg/dL (ref 0.0–1.2)
CO2: 21 mmol/L (ref 20–29)
Calcium: 9.4 mg/dL (ref 8.7–10.3)
Chloride: 100 mmol/L (ref 96–106)
Creatinine, Ser: 0.86 mg/dL (ref 0.57–1.00)
Globulin, Total: 3 g/dL (ref 1.5–4.5)
Glucose: 150 mg/dL — ABNORMAL HIGH (ref 70–99)
Potassium: 3.9 mmol/L (ref 3.5–5.2)
Sodium: 138 mmol/L (ref 134–144)
Total Protein: 7.1 g/dL (ref 6.0–8.5)
eGFR: 74 mL/min/{1.73_m2} (ref >59)

## 2024-02-13 LAB — LIPID PANEL
Chol/HDL Ratio: 2.2 ratio (ref 0.0–4.4)
Cholesterol, Total: 191 mg/dL (ref 100–199)
HDL: 85 mg/dL (ref >39)
LDL Chol Calc (NIH): 93 mg/dL (ref 0–99)
Triglycerides: 74 mg/dL (ref 0–149)
VLDL Cholesterol Cal: 13 mg/dL (ref 5–40)

## 2024-02-13 LAB — TSH: TSH: 1.01 u[IU]/mL (ref 0.450–4.500)

## 2024-02-16 DIAGNOSIS — F411 Generalized anxiety disorder: Secondary | ICD-10-CM | POA: Diagnosis not present

## 2024-02-16 DIAGNOSIS — F4310 Post-traumatic stress disorder, unspecified: Secondary | ICD-10-CM | POA: Diagnosis not present

## 2024-02-16 DIAGNOSIS — F330 Major depressive disorder, recurrent, mild: Secondary | ICD-10-CM | POA: Diagnosis not present

## 2024-02-25 DIAGNOSIS — R002 Palpitations: Secondary | ICD-10-CM | POA: Diagnosis not present

## 2024-02-25 DIAGNOSIS — I493 Ventricular premature depolarization: Secondary | ICD-10-CM | POA: Diagnosis not present

## 2024-02-25 DIAGNOSIS — I480 Paroxysmal atrial fibrillation: Secondary | ICD-10-CM | POA: Diagnosis not present

## 2024-02-26 ENCOUNTER — Other Ambulatory Visit: Payer: Self-pay | Admitting: Internal Medicine

## 2024-02-26 DIAGNOSIS — J4520 Mild intermittent asthma, uncomplicated: Secondary | ICD-10-CM

## 2024-02-26 DIAGNOSIS — C50811 Malignant neoplasm of overlapping sites of right female breast: Secondary | ICD-10-CM | POA: Diagnosis not present

## 2024-02-26 DIAGNOSIS — Z170 Estrogen receptor positive status [ER+]: Secondary | ICD-10-CM | POA: Diagnosis not present

## 2024-02-27 DIAGNOSIS — F411 Generalized anxiety disorder: Secondary | ICD-10-CM | POA: Diagnosis not present

## 2024-02-27 DIAGNOSIS — F330 Major depressive disorder, recurrent, mild: Secondary | ICD-10-CM | POA: Diagnosis not present

## 2024-02-27 DIAGNOSIS — F4312 Post-traumatic stress disorder, chronic: Secondary | ICD-10-CM | POA: Diagnosis not present

## 2024-03-09 DIAGNOSIS — Z170 Estrogen receptor positive status [ER+]: Secondary | ICD-10-CM | POA: Diagnosis not present

## 2024-03-09 DIAGNOSIS — C50811 Malignant neoplasm of overlapping sites of right female breast: Secondary | ICD-10-CM | POA: Diagnosis not present

## 2024-03-09 DIAGNOSIS — Z9011 Acquired absence of right breast and nipple: Secondary | ICD-10-CM | POA: Diagnosis not present

## 2024-03-09 DIAGNOSIS — N6310 Unspecified lump in the right breast, unspecified quadrant: Secondary | ICD-10-CM | POA: Diagnosis not present

## 2024-03-10 DIAGNOSIS — F411 Generalized anxiety disorder: Secondary | ICD-10-CM | POA: Diagnosis not present

## 2024-03-10 DIAGNOSIS — F4312 Post-traumatic stress disorder, chronic: Secondary | ICD-10-CM | POA: Diagnosis not present

## 2024-03-10 DIAGNOSIS — F330 Major depressive disorder, recurrent, mild: Secondary | ICD-10-CM | POA: Diagnosis not present

## 2024-03-11 DIAGNOSIS — H52221 Regular astigmatism, right eye: Secondary | ICD-10-CM | POA: Diagnosis not present

## 2024-03-11 DIAGNOSIS — H25811 Combined forms of age-related cataract, right eye: Secondary | ICD-10-CM | POA: Diagnosis not present

## 2024-03-16 DIAGNOSIS — F331 Major depressive disorder, recurrent, moderate: Secondary | ICD-10-CM | POA: Diagnosis not present

## 2024-03-16 DIAGNOSIS — F4312 Post-traumatic stress disorder, chronic: Secondary | ICD-10-CM | POA: Diagnosis not present

## 2024-03-16 DIAGNOSIS — F411 Generalized anxiety disorder: Secondary | ICD-10-CM | POA: Diagnosis not present

## 2024-03-17 ENCOUNTER — Ambulatory Visit (INDEPENDENT_AMBULATORY_CARE_PROVIDER_SITE_OTHER): Admitting: Family Medicine

## 2024-03-17 ENCOUNTER — Encounter: Payer: Self-pay | Admitting: Family Medicine

## 2024-03-17 VITALS — BP 129/75 | HR 71 | Temp 97.3°F | Ht 64.0 in | Wt 176.4 lb

## 2024-03-17 DIAGNOSIS — E1069 Type 1 diabetes mellitus with other specified complication: Secondary | ICD-10-CM | POA: Diagnosis not present

## 2024-03-17 DIAGNOSIS — R079 Chest pain, unspecified: Secondary | ICD-10-CM

## 2024-03-17 DIAGNOSIS — Z13820 Encounter for screening for osteoporosis: Secondary | ICD-10-CM | POA: Diagnosis not present

## 2024-03-17 DIAGNOSIS — E559 Vitamin D deficiency, unspecified: Secondary | ICD-10-CM

## 2024-03-17 DIAGNOSIS — R5383 Other fatigue: Secondary | ICD-10-CM

## 2024-03-17 MED ORDER — NITROGLYCERIN 0.4 MG SL SUBL: 0.4000 mg | SUBLINGUAL_TABLET | SUBLINGUAL | 3 refills | Status: AC | PRN

## 2024-03-17 NOTE — Progress Notes (Signed)
   Established Patient Office Visit  Subjective   Patient ID: Kristina Mccoy, female    DOB: [birth year]  Age: 68 y.o. MRN: [id number]  Chief Complaint  Patient presents with  . Diabetes    Patient presents today for a 3 month follow up on her diabetes. She takes her medications as directed and is doing well today.     HPI   {History (Optional):23778}  ROS    Objective:     BP 129/75   Pulse 71   Temp (!) 97.3 F (36.3 C)   Ht 5' 4 (1.626 m)   Wt 176 lb 6.4 oz (80 kg)   SpO2 97%   BMI 30.28 kg/m  {Vitals History (Optional):23777}  Physical Exam ***  {Perform Simple Foot Exam  Perform Detailed exam:1} {Insert foot Exam (Optional):30965}   No results found for any visits on 03/17/24.  {Labs (Optional):23779}  The ASCVD Risk score (Arnett DK, et al., 2019) failed to calculate for the following reasons:   Risk score cannot be calculated because patient has a medical history suggesting prior/existing ASCVD    Assessment & Plan:  There are no diagnoses linked to this encounter.   No follow-ups on file.    Lolita Faulds K Thaddaeus Granja, MD

## 2024-03-18 NOTE — Assessment & Plan Note (Signed)
 Last bone mineral density was 02/13/2021 and reported as normal.  Will get this repeated.

## 2024-03-18 NOTE — Assessment & Plan Note (Signed)
 She experienced a chest pain that was deep and throbbing and lasted about 20 minutes that was associated with diaphoresis and shortness of breath.  She did not go to the hospital by EMS.  Gave her a prescription for sublingual nitroglycerin should this happen again.  Please report this to your cardiologist at your appointment on Friday.

## 2024-03-18 NOTE — Assessment & Plan Note (Signed)
 She is taking vitamin D3 approximately 1000 IU daily.  At next lab we will check her vitamin D  level.

## 2024-03-18 NOTE — Progress Notes (Signed)
 [name], [name] 03/19/2024 DOB: 09/10/1956  Age: 68 y.Mccoy. Clinic Note - Cardiovascular Howard A. Archer, M.D.  PRIMARY CARE PROVIDER:  Ziglar, Susan, MD [address] [place] [address]  PATIENT PROFILE:  Kristina Mccoy is a 68 y.Mccoy. female who is seen in F/U for pAFib, amaurosis fugax, SOBOE and chest tightness.   PROBLEM LIST:    Patient Active Problem List   Diagnosis Date Noted  . SOB (shortness of breath) on exertion 11/25/2014  . Atypical chest pain 03/19/2024  . Paroxysmal atrial fibrillation (CMS/HHS-HCC) 12/29/2023  . Amaurosis fugax of left eye (due to cardioembolic etiology) 08/26/2023  . Statin intolerance 02/18/2022  . Osteoarthritis 01/17/2022  . Bone lesion 11/09/2021  . PVC's (premature ventricular contractions) 11/09/2021  . Hypertension 10/26/2021  . Pelvic pain in female   . Abnormal SPEP 05/01/2021  . 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 Elevated aldolase level 04/28/2020  . Tick bite of back 02/14/2020  . Former smoker 08/30/2019  . Restrictive lung disease 08/30/2019  . Exertional chest pain 01/05/2019  . Skin lesion 06/30/2018  . Hyperlipidemia associated with type 2 diabetes mellitus (CMS/HHS-HCC) 05/29/2018  . Type 2 diabetes mellitus with hyperglycemia, with long-term current use of insulin  (CMS/HHS-HCC) 05/15/2018  . Vitamin D  deficiency 05/15/2018  . Iliotibial band syndrome 04/27/2018  . Memory difficulty 04/27/2018  . Allergic rhinitis 01/26/2018  . Obesity (BMI 30.0-34.9) 10/28/2017  . Fibromyalgia 01/30/2017  . Dry eye syndrome, bilateral 01/30/2017  . Mouth dryness 01/30/2017  . Polyarthralgia 01/30/2017  . Elevated LFTs 10/15/2016  . Primary biliary cirrhosis (CMS/HHS-HCC) 10/15/2016  . Throat fullness 10/15/2016  . Dysuria 12/20/2015  . Chronic fatigue 12/04/2015  . Insomnia 10/19/2015  . Obstructive sleep apnea 10/19/2014  . Anxiety and depression 08/16/2014  . Allergic  urticaria 06/02/2014  . 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CMS/HHS-HCC) 06/02/2014  . Mixed emotional features as adjustment reaction 01/24/2014  . Sicca syndrome (CMS/HHS-HCC) 12/21/2013  . Vaginal yeast infection 07/06/2013  . Esophageal reflux 05/05/2013  . history of breast cancer 02/24/2013  . Blood clotting disorder (CMS/HHS-HCC) 12/01/2012  . Palpitations 12/01/2012  . Hearing loss 09/16/2011  . Myalgia and myositis 06/15/2011  . Neuromyositis 06/15/2011    CURRENT MEDICATIONS:    Current Outpatient Medications  Medication Sig Dispense Refill  . albuterol  90 mcg/actuation inhaler Inhale 2 inhalations into the lungs every 4 (four) hours as needed    . ALPRAZolam  (XANAX ) 0.5 MG tablet TAKE ONE TABLET FOUR TIMES DAILY AS NEEDED FOR ANXIETY    . apixaban (ELIQUIS) 5 mg tablet Take 1 tablet (5 mg total) by mouth every 12 (twelve) hours 60 tablet 11  . azelastine  (ASTELIN ) 137 mcg nasal spray Place 2 sprays into both nostrils 2 (two) times daily    . baclofen  (LIORESAL ) 10 MG tablet Take 10 mg by mouth once daily as needed    . blood glucose meter kit by Other route once for 1 dose. Use as instructed    . candesartan (ATACAND) 8 MG tablet Take 0.5 tablets (4 mg total) by mouth once daily 45 tablet 3  . diclofenac (VOLTAREN) 1 % topical gel Apply 4 g topically 3 (three) times daily as needed 100 g 0  . ezetimibe (ZETIA) 10 mg tablet Take 1 tablet (10 mg total) by mouth once daily 90 tablet 3  . flash glucose scanning reader (FREESTYLE LIBRE 14 DAY READER) Misc Use 1 kit as directed E11.65 1 each 1  . fluticasone  propion-salmeteroL (ADVAIR  DISKUS) 500-50 mcg/dose diskus inhaler Inhale 1 Puff into the lungs every 12 (twelve) hours    . FREESTYLE LIBRE 14 DAY SENSOR kit USE 1 KIT EVERY 14 (FOURTEEN) DAYS E11.65 6 kit 2  . glucagon (BAQSIMI) 3 mg/actuation nasal spray 1 spray    . hydrocortisone 2.5 % cream 1(ONE) APPLICATION(S) TOPICAL 2(TWO) TIMES A DAY    . insulin  ASPART  (NOVOLOG  FLEXPEN U-100 INSULIN ) pen injector (concentration 100 units/mL) Inject 5 Units subcutaneously 3 (three) times daily with meals SSI    . insulin  syringe-needle U-100 0.5 mL 31 gauge x 5/16 syringe Use twice daily with injections 200 each 1  . ketoconazole (NIZORAL) 2 % shampoo once daily as needed    . metoprolol  SUCCinate (TOPROL -XL) 50 MG XL tablet Take 1 tablet (50 mg total) by mouth once daily 90 tablet 3  . nitroGLYcerin  (NITROSTAT ) 0.4 MG SL tablet Place 0.4 mg under the tongue every 5 (five) minutes as needed for Chest pain    . ondansetron  (ZOFRAN ) 4 MG tablet Take 1 tablet (4 mg total) by mouth once daily as needed 20 tablet 3  . ONETOUCH DELICA PLUS LANCET USE TO TEST BLOOD SUGAR 4 X DAY, PER MD INSTRUCTION.    SABRA ONETOUCH ULTRA TEST test strip TEST 4 TIMES DAILY    . pantoprazole  (PROTONIX ) 40 MG DR tablet Take 40 mg by mouth once daily    . prednisolone-moxiflox-bromfen 1-0.5-0.075 % DrpS Apply 1 drop to eye 2 (two) times daily 8 mL 1  . rosuvastatin  (CRESTOR ) 5 MG tablet Take 1 tablet (5 mg total) by mouth once daily 90 tablet 3  . TRESIBA FLEXTOUCH U-200 pen injector (concentration 200 units/mL) Inject 12 Units subcutaneously once daily    . ursodioL (URSO FORTE) 500 MG tablet TAEK 2 TAB BY MOUTH ONCE DAILY 180 tablet 1   No current facility-administered medications for this visit.    ALLERGIES/SENSITIVITIES:  Allergies  Allergen Reactions  . Adhesive Itching, Dermatitis and Rash    Ingredient - Acrylic   . Lyrica  [Pregabalin ] Swelling    Swelling in arms and legs  . Sodium Fluoride Rash    Burning sensation on lips and redness  . Wound Dressings Dermatitis, Itching and Rash    Ingredient - Acrylic  . Evolocumab  Other (See Comments)    abdominal distention, headache, higher blood sugars, facial skin peeling  . Oxycodone  Itching and Rash  . Buprenorphine Swelling and Rash  . Buprenorphine Hcl Rash  . Carbamazepine Nausea And Vomiting and Other (See Comments)     Abdominal pain   . Doxycycline  Abdominal Pain, Diarrhea and Other (See Comments)  . Ezetimibe Other (See Comments)    myalgias  . Gabapentin  Itching  . Invokana  [Canagliflozin ] Rash and Other (See Comments)    YEAST INFECTIONS  . Lantus  [Insulin  Glargine] Other (See Comments)    BLOATING, MUSCLE/JOINT PAIN  . Levofloxacin  Diarrhea    Diarrhea and pain in calf Diarrhea and pain in calf Diarrhea and pain in calf  . Lyrica  [Pregabalin ] Swelling and Rash  . Metformin Other (See Comments)    Cramping- abdominal pain  . Methylprednisolone Other (See Comments)    Reddened face, puffy face   . Methylprednisolone Swelling and Rash  . Mirtazapine  Dizziness  . Morphine Hallucination  . Rosuvastatin  Other (See Comments)    LFTs increased   . Trulicity [Dulaglutide] Other (See Comments)    Severe stomach pain  . Venlafaxine Other (See Comments)    Light headed, numbness  .  Escitalopram  Rash  . Hydroxychloroquine Rash  . Mometasone  Furoate Rash    Facial rash     History of Present Illness:  Catarina Huntley is interviewed by telephone for evaluation of SOBOE.    Relevant PMHx:  2014:  diagnosed with breast cancer treated with bilateral mastectomy and tamoxifen and radiation therapy.   2015: exertional SOB.  Echo done by Dr. Darron showed normal EF without regional wall motion abnormalities, grade I diastolic function, normal RV size and function valves normal. 08/2014:  CP and SOB CTA ruled out a PE, BNP was elevated at 3333 CK elevated 765, MB 8.5 and TnI 0.16.  CXR showed small bilateral pleural effusions.  SOB at that time secondary to bilateral pneumonia.   12/14/2014:   Pharmacologic Nuclear stress: Evidence of mild anterior and apical myocardial ischemia upon regadenoson  stress.,  LVEF= 54% with normal systolic function. 03/28/2015:  Right and Left heart cath.   Coronary arteries:  Left main: normal, LAD: normal, Cx: normal, RCA: insignificant Hemodynamics (mm Hg):  Ao/BP (asc Ao): 107/62  Mean: 81 mmHg, LV: 112/8 EDP: 14 mmHg, RA: 6 mmHg (mean), RV: 27/ 5 mmHg, PA: 26/ 9 17 mmHg (mean), PCW: 7 mmHg (mean), AV O2: 4.8 vol%, Cardiac output: 4.5 L/min, PVR: 2.2 Wood units, Shunt Ratio: 1.0 (Qp/Qs) 10/14/2023:  Paroxysmal Atrial Fibrillation presenting with sudden transient loss of vision L eye and concern for amaurosis fugax.  AFib found on MCT.   Last seen by me on 12-30-2023.  AFib resulting in sudden transient loss of vision L eye - amaurosis fugax.  On Apixaban.  MCT on 11-21-2023 showed short runs of atrial fibrillation.  Continues to have symptoms of intermittent heart fluttering.   Episode of chest pain awoke from sleep x 20 mins felt a deep pain on right side with SOB.  Carotid U/S showed mild calcified plaque bilateral ICA.   No further loss of vision.  Home BP's 120's.  No ischemia on stress MRI in 2022.    She denies PND, orthopnea or ankle swelling.  No syncope or presyncope.   Abdominal MRI - aortic atherosclerosis.  Limited exercise.  Paroxysmal Atrial fibrillation:   Current rhythm - NSR Current anti-arrhythmic - none Current rate-control medication - Metoprolol                                   Stroke Prevention CHA2DS2-VASc= ,   on Apixaban mg BID. Control strategy -  Rhythm Control  CHA2DS2-VASC Score:  Age: 38-74 1 point, Sex: Female  1 point, CHF: No 0 points, HTN: Yes 1 point, CVA/TIA/VTE: Yes 2 points, Vascular Disease: Yes 1 point, Diabetes: Yes 1 point   2% PVC burden on Holter (06/2022), no other sustained arrhythmias.  Started on Toprol .  Cardiac MRI (2022) - normal LV fxn and no ischemia , but small area of LGE in the inferolateral wall (?MINOCA).  No obstructive coronary disease on cath in 2016. Statin intolerant: Bempedoic acid too expensive.  Reaction to Repatha  and was stopped.She is followed by pulmonary for mild restrictive lung disease thought due to the effects of Sjogren's syndrome on the lung and radiation fibrosis. (PFTs: RV 74, DLCO 61.   Sleep study  February 2016 AHI 14.3, AutoPap 5-15) and using CPAP.  Pulmonary symptoms improved somewhat on bronchodilators.     02-25-2024  Echo NORMAL LEFT VENTRICULAR SYSTOLIC FUNCTION WITH NO LVH ESTIMATED EF: >55%, CALC EF(3D): 56% NORMAL LA PRESSURES  WITH NORMAL DIASTOLIC FUNCTION NORMAL RIGHT VENTRICULAR SYSTOLIC FUNCTION VALVULAR REGURGITATION: No AR, TRIVIAL MR, TRIVIAL PR, TRIVIAL TR ESTIMATED RVSP: 12 mmHg   NO VALVULAR STENOSIS   Compared with prior Echo study on 03/08/2022 NO SIGNIFICANT CHANGES   11-21-2023  MCT monitoring Study was adequate for interpretation Patient reported events related to a sinus mechanism and short runs of atrial fibrillation  No critical events were automatically detected   09/04/2023  14 day Holter The predominant rhythm was Sinus *The Maximum Heart Rate recorded was 137 bpm, 12/07 14:46:36, the Minimum Heart Rate recorded was 51 bpm, 12/14 06:42:31, and the Average Heart Rate was 76 bpm. *There were 8,345 VE beats with a burden of <1 %. There was 1 occurrence of Ventricular Tachycardia with the Fastest episode 103 bpm, 12/14 15:37:37, and the Longest episode 3 beats, 12/14 15:37:37. *There were 632 SVE beats with a burden of <1 %. *Other rhythms in this study include: Ventricular Run. *There were 40 Patient Triggers corresponding to sinus mechanism rhythms with and without PACs and PVCs.   03/08/2022  Echo NORMAL LEFT VENTRICULAR SYSTOLIC FUNCTION WITH MILD LVH NORMAL RIGHT VENTRICULAR SYSTOLIC FUNCTION VALVULAR REGURGITATION: TRIVIAL MR, TRIVIAL PR, TRIVIAL TR  NO VALVULAR STENOSIS INSUFFICIENT TR TO ESTIMATE RVSP Compared with prior Echo study on 11/25/2014: NO SIGNIFICANT CHANGE  12-11-2020 Cardiac MRI:  1. The left ventricle is normal in cavity size and wall thickness. Global systolic function is normal with an LV ejection fraction calculated at 64%. There is mild hypokinesis of the basal-to-mid inferolateral wall. 2. The right ventricle is normal in  cavity size, wall thickness, and systolic function. 3. Both atria are normal in size. 4. The aortic valve is trileaflet in morphology. There is no significant aortic valve stenosis or regurgitation. There is no significant valvular disease. 5. Delayed enhancement imaging for viability is abnormal. There is a small subendocardial infarction (< 25% transmural) involving the basal-to-mid inferolateral wall, which is consistent with the distribution of a posterolateral branch of the RCA or LCx (depending on the pattern of coronary dominance). 6. Regadenoson  stress perfusion imaging demonstrates no evidence of inducible myocardial ischemia.   7.  No intracardiac thrombus visualized. 8. The pericardium is normal in thickness. There is no significant pericardial effusion.    Lab Results  Component Value Date   HDL 74 12/30/2023     Lab Results  Component Value Date   LDLCALC 87 12/30/2023    Lab Results  Component Value Date   TRIG 45 12/30/2023    Cardiac Risk Factors: Hyperlipidemia:  yes.   Hypertension: no.   Diabetes Mellitus: yes.   Tobacco: no.   Obesity: no.   Sedentary lifestyle: yes.      Review of Systems:  A comprehensive review of systems was obtained with the patient and is negative.  Physical Examination:  BP 125/77 (BP Location: Left upper arm, Patient Position: Sitting, BP Cuff Size: Large Adult)   Pulse 70   Resp 18   Ht 162.6 cm (5' 4)   Wt 79.5 kg (175 lb 4.8 oz)   LMP  (LMP Unknown)   SpO2 99%   BMI 30.09 kg/m  Wt Readings from Last 3 Encounters:  03/19/24 79.5 kg (175 lb 4.8 oz)  03/11/24 80 kg (176 lb 5.9 oz)  02/26/24 80.2 kg (176 lb 14.4 oz)   BP Readings from Last 3 Encounters:  03/19/24 125/77  03/11/24 110/58  02/26/24 113/68    Pupils are equal and reactive to light.  No thyromegaly.  No cervical adenopathy. Chest:  Clear.   Cardiovascular:  JVP is flat.  Carotid upstroke is brisk. No carotid bruits.  S1, S2 normal. No RV lift.   No S3  or S4.  II/VI SEM.  No pericardial friction rub.    Abdomen:  Soft, nontender with no abdominal bruits.  Normal bowel sounds.  No femoral bruits.  Extremities:  +2 pedal pulses.  No pedal edema. Neurological:  Grossly normal.   Skin:  Dry and intact without lesions.   Laboratory:  ECG - NSR, normal  Assessment:   1. Paroxysmal atrial fibrillation (CMS/HHS-HCC)   2. Atypical chest pain   3. Amaurosis fugax of left eye (due to cardioembolic etiology)   4. 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 PVC's (premature ventricular contractions)   6. Primary hypertension   7. SOB (shortness of breath) on exertion   8. Palpitations   9. Hyperlipidemia associated with type 2 diabetes mellitus (CMS/HHS-HCC)     Plan:   Orders Placed This Encounter  Procedures  . CT heart angiogram  . Cardiac event monitor - MCT interpretation  . Cardiac event monitor - MCT hook up   Paroxysmal Atrial Fibrillation:  Transient loss of vision - Kristina/W Amaurosis Fugax.  On Apixaban 5 mg BID Will obtain a 30 day MCT to assess AFib burden  Atypical chest pain.  R/Mccoy CAD - will obtain a coronary CTA to assess for obstructive CAD S/P MI/MINOCA in 2022.  In retrospect CRO possibility that MI was due to a coronary embolic event from PAF. BP at goal Lipids:  LDL - 87 - not quite at goal given diabetes (<70).  Previously intolerant to statins and Repatha . On Zetia 10 mg/day.  Tolerating Crestor  5 mg/day. Daily exercise Dash Diet F/U 6-9 months   I spent a total of 35 minutes in both face-to-face and non-face-to-face activities, excluding procedures performed, for this visit on the date of this encounter.  Howard A. Archer, M.D. Professor of Medicine [employer] Professor of Cardiology

## 2024-03-18 NOTE — Assessment & Plan Note (Signed)
 Encouraging her to walk daily to increase her stamina and improve her fatigue.

## 2024-03-18 NOTE — Assessment & Plan Note (Signed)
 Pharmacy is helping her with her insulin  dosage.  She takes 5 units before she eats and then checks her sugar at 3 hours after her meal for correction.

## 2024-03-19 DIAGNOSIS — E785 Hyperlipidemia, unspecified: Secondary | ICD-10-CM | POA: Diagnosis not present

## 2024-03-19 DIAGNOSIS — I219 Acute myocardial infarction, unspecified: Secondary | ICD-10-CM | POA: Diagnosis not present

## 2024-03-19 DIAGNOSIS — I480 Paroxysmal atrial fibrillation: Secondary | ICD-10-CM | POA: Diagnosis not present

## 2024-03-19 DIAGNOSIS — I493 Ventricular premature depolarization: Secondary | ICD-10-CM | POA: Diagnosis not present

## 2024-03-19 DIAGNOSIS — G453 Amaurosis fugax: Secondary | ICD-10-CM | POA: Diagnosis not present

## 2024-03-19 DIAGNOSIS — R0602 Shortness of breath: Secondary | ICD-10-CM | POA: Diagnosis not present

## 2024-03-19 DIAGNOSIS — R0789 Other chest pain: Secondary | ICD-10-CM | POA: Diagnosis not present

## 2024-03-19 DIAGNOSIS — E1169 Type 2 diabetes mellitus with other specified complication: Secondary | ICD-10-CM | POA: Diagnosis not present

## 2024-03-19 DIAGNOSIS — I10 Essential (primary) hypertension: Secondary | ICD-10-CM | POA: Diagnosis not present

## 2024-03-19 DIAGNOSIS — R002 Palpitations: Secondary | ICD-10-CM | POA: Diagnosis not present

## 2024-03-23 ENCOUNTER — Encounter: Payer: Self-pay | Admitting: *Deleted

## 2024-03-23 ENCOUNTER — Encounter: Payer: Self-pay | Admitting: Pharmacist

## 2024-03-23 DIAGNOSIS — F411 Generalized anxiety disorder: Secondary | ICD-10-CM | POA: Diagnosis not present

## 2024-03-23 DIAGNOSIS — F4312 Post-traumatic stress disorder, chronic: Secondary | ICD-10-CM | POA: Diagnosis not present

## 2024-03-23 DIAGNOSIS — F331 Major depressive disorder, recurrent, moderate: Secondary | ICD-10-CM | POA: Diagnosis not present

## 2024-03-23 NOTE — Telephone Encounter (Signed)
 Patient notified that Patient Assistance Medication are in the office & are ready for pick up.   Medication #1: [place] U100  Quantity: 1 box  Lot# [id number]  Exp: 06/29/2026   Medication #2: Novolog  FlexPen  Quantity: 2 boxes  Lot# [id number]  Exp: 02/27/2026

## 2024-03-23 NOTE — Telephone Encounter (Signed)
 Copied from CRM [id number]. Topic: Clinical - Prescription Issue >> Mar 23, 2024 12:33 PM Corin V wrote: Reason for CRM: Patient is coming to pick up her medication at the office, but she is confused why it was not delivered to the [facility] office. She said she spoke to Elmwood Zinc with the patient assistance program and talked to her about transferring to the [facility] office and that everything was updated appropriately. She thought this was all taken care of and is not comfortable calling the customer service line and would like Morna to call her back to see what steps need to be taken to get the meds delivered correctly moving forward. Please have Morna call back at [phone]. Patient will likely pick up meds tomorrow morning due to the current heat wave.

## 2024-03-23 NOTE — Progress Notes (Signed)
 Brief Telephone Documentation Reason for Call: Patient left message regarding question for pharmacist   Summary of Call: Called patient 6/24.  PAP medications delivered to [facility] despite previous provider change request completion. Form documented in Media tab outlining new provider and office location. Assume error on [employer]'s end.   Refill/Provider change request form re-printed and faxed to clinic of Dr. Ziglar for review and signature with cover page outlining next steps: Review/Sign Fax to [employer] [phone]   Kristina Mccoy, PharmD Clinical Pharmacist [facility] [phone]

## 2024-03-25 DIAGNOSIS — Z961 Presence of intraocular lens: Secondary | ICD-10-CM | POA: Diagnosis not present

## 2024-03-25 DIAGNOSIS — R0789 Other chest pain: Secondary | ICD-10-CM | POA: Diagnosis not present

## 2024-03-25 DIAGNOSIS — H52222 Regular astigmatism, left eye: Secondary | ICD-10-CM | POA: Diagnosis not present

## 2024-03-25 DIAGNOSIS — G453 Amaurosis fugax: Secondary | ICD-10-CM | POA: Diagnosis not present

## 2024-03-25 DIAGNOSIS — Z9841 Cataract extraction status, right eye: Secondary | ICD-10-CM | POA: Diagnosis not present

## 2024-03-25 DIAGNOSIS — I480 Paroxysmal atrial fibrillation: Secondary | ICD-10-CM | POA: Diagnosis not present

## 2024-03-25 DIAGNOSIS — H25812 Combined forms of age-related cataract, left eye: Secondary | ICD-10-CM | POA: Diagnosis not present

## 2024-03-25 NOTE — Anesthesia Preprocedure Evaluation (Signed)
 PASS-Perioperative Anesthesia & Surgical Screening    Phone Screen interview ALERTS:   Sleep apnea Chronic benzodiazepine use (Xanax  0.5mg  QID)  Avoid R arm, s/p axillary lymph node dissection   Procedure:    Date/Time: 03/11/2024   Procedure: LenSx/Toric/ORA - EXTRACAPSULAR CATARACT ECCE REMOVAL WITH INSERTION OF INTRAOCULAR LENS PROSTHESIS (1 STAGE PROCEDURE), MANUAL OR MECHANICAL TECHNIQUE; WITHOUT ENDOSCOPIC CYCLOPHOTOCOAGULATION    Location: [facility] OR Dr.Kim     CC/HPI  Kristina Mccoy is a 68 y.o. female  for pre-op evaluation to assess risk for surgical or anesthetic complications.  Patient with visually significant cataract.   Vitals (36hrs): BP: ()/()  BP Readings from Last 3 Encounters:  12/30/23 129/75  12/04/23 119/66  11/07/23 112/69   Medical History                                    HEENT Lower partail  Pulmonary .  Sleep apnea: CPAP, Compliant .  Asthma:.  COPD:  Mild restrictive lung disease thought due to the effects of Sjogren's syndrome on the lung and radiation fibrosis   03/2023-Chest CT-Centrilobular and paraseptal emphysema. Apical and  subpleural radiation scarring in the right upper lobe.  -no progression of disease over the last several years        Cardiovascular  .  Exercise tolerance: <4 METS (ADLs, limited by back pain, neck pain) .  HTN: controlled .  MI (2022): SABRA  Atrial fibrillation:  paroxysmal  Followed by cardiology, Dr.Rockman-last office visit 12/30/2023  10/2023:  Paroxysmal Atrial Fibrillation presenting with sudden transient loss of vision L eye -C/W Amaurosis Fugax, now on Eliquis   2 week Holter showed sinus throughout with PACs and PVCs.  MCT on 11-21-2023 showed short runs of atrial fibrillation.   PAF - low burden a this time  S/P MI/MINOCA in 2022.  In retrospect CRO possibility that MI was due to a coronary embolic event from PAF  -Carotid U/S showed mild calcified plaque bilateral ICA.    Stress Cardiac  MRI (2022) - normal LV fxn and no ischemia , but small area of LGE in the inferolateral wall (?MINOCA)  No obstructive coronary disease on cath in 2016 Gastrointestinal .  GERD:  Primary biliary cholangitis diagnosed in 2017. On Ursodiol. Followed by GI.     Neuro/Spine/CNS DDD C3-C7  Hematology/Oncology  .  Anticoagulation therapy .  Breast cancer (2014-R, s/p bilateral mastectomy): Cancer treatment: mastectomy, radiation and chemotherapy  11/2023-Heme consult  -Positive cardiolipin antibodies-not associated with increased thrombosis risk.  -no hx DVT/PE -no antiphospholipid syndrome do not feel patient is at risk for thrombosis beyond the risk conferred by her medical problems, ie A -Fib.    Rheumatology .  Sjogren syndrome Sjogren's syndrome/sicca syndrome  -follows with [facility]ology     Endocrine .  Diabetes (type 1.5), DM Type 2, insulin , diet:   01/2024-A1c 7.9%  -Libre sensor   Musculoskeletal .  Fibromyalgia .  Osteoarthritis  Pain .  Chronic pain: : musculoskeletal, neuropathic Skin  . Skin cancer: Melanoma Psych . Anxiety . Depression    Review of Systems   A comprehensive 10-system ROS was asked and was negative except for the following:  HEENT: hearing impairment (hearing aid, left), left, bilateral   Past Surgical History   Past Surgical History:  Procedure Laterality Date  . BREAST EXCISIONAL BIOPSY Right 2002  . BREAST EXCISIONAL BIOPSY Left 2009  . BREAST SURGERY  02/2013  . COLONOSCOPY    . COLONOSCOPY  04/22/2017   Negaitve colon biopsy/FHx CP-Brother/Repeat 26yrs/MUS  . EGD  04/22/2017   GERD/Gastritis/No Repeat/MUS  . EXTRACTION CATARACT INTRACAPSULAR W/INSERTION INTRAOCULAR PROSTHESIS Right 03/11/2024   Procedure: Right - LenSx/Toric/ORA - EXTRACAPSULAR CATARACT ECCE REMOVAL WITH INSERTION OF INTRAOCULAR LENS PROSTHESIS (1 STAGE PROCEDURE), MANUAL OR MECHANICAL TECHNIQUE; WITHOUT ENDOSCOPIC CYCLOPHOTOCOAGULATION;   Surgeon: Luke Larve, MD;  Location: [facility];  Service: Ophthalmology;  Laterality: Right;  . laparoscopic surgery  2009   pedunculated fibroid removal (large) removed by Dr Jayne in [place].   [name] Bilateral 03/23/2013   rt slnbx (4/6)  . MASTECTOMY BILATERAL MODIFIED RADICAL     02/2013    Social/Family History   Social History   Occupational History  . Occupation: Disabled  Tobacco Use  . Smoking status: Former    Current packs/day: 0.00    Average packs/day: 0.5 packs/day for 20.0 years (10.0 ttl pk-yrs)    Types: Cigarettes    Start date: 03/15/1993    Quit date: 03/15/2013    Years since quitting: 11.0  . Smokeless tobacco: Never  Vaping Use  . Vaping status: Never Used  Substance and Sexual Activity  . Alcohol use: No  . Drug use: No  . Sexual activity: Not Currently    Birth control/protection: None   Vaping/E-Cigarettes  . Vaping/E-Cigarette Use Never User   . Start Date    . Cartridges/Day    . Quit Date     Family History  Problem Relation Age of Onset  . Diabetes Mother   . Coronary Artery Disease (Blocked arteries around heart) Mother   . Heart disease Mother   . Pancreatic cancer Father   . Gout Father   . Rheum arthritis Sister   . Arthritis Sister   . Rheumatologic disease Sister        Neice  . Heart disease Sister   . Diabetes Brother   . Arthritis Brother   . Rheum arthritis Brother   . Colon polyps Brother   . Osteoarthritis Brother   . Colon polyps Brother   . Diabetes Brother   . Heart disease Brother   . Myocardial Infarction (Heart attack) Brother   . Liver disease Brother        HCV  . Stroke Maternal Grandmother   . Myocardial Infarction (Heart attack) Maternal Grandfather   . Colon cancer Paternal Grandmother   . Diabetes Paternal Grandmother   . Myocardial Infarction (Heart attack) Paternal Grandfather   . Heart disease Paternal Grandfather   . Breast cancer Maternal Aunt   . Rheum arthritis Maternal Aunt   .  Arthritis Maternal Aunt   . Rheumatologic disease Maternal Aunt   . Breast cancer Maternal Aunt   . Scleroderma Other   . Colon cancer Other   . Anesthesia problems Neg Hx    Allergies   Allergies  Allergen Reactions  . Adhesive Itching, Dermatitis and Rash    Ingredient - Acrylic   . Lyrica  [Pregabalin ] Swelling    Swelling in arms and legs  . Sodium Fluoride Rash    Burning sensation on lips and redness  . Wound Dressings Dermatitis, Itching and Rash    Ingredient - Acrylic  . Evolocumab  Other (See Comments)    abdominal distention, headache, higher blood sugars, facial skin peeling  . Oxycodone  Itching and Rash  . Buprenorphine Swelling and Rash  . Buprenorphine Hcl Rash  . Carbamazepine Nausea And Vomiting and Other (See Comments)  Abdominal pain   . Doxycycline  Abdominal Pain, Diarrhea and Other (See Comments)  . Ezetimibe Other (See Comments)    myalgias  . Gabapentin  Itching  . Invokana  [Canagliflozin ] Rash and Other (See Comments)    YEAST INFECTIONS  . Lantus  [Insulin  Glargine] Other (See Comments)    BLOATING, MUSCLE/JOINT PAIN  . Levofloxacin  Diarrhea    Diarrhea and pain in calf Diarrhea and pain in calf Diarrhea and pain in calf  . Lyrica  [Pregabalin ] Swelling and Rash  . Metformin Other (See Comments)    Cramping- abdominal pain  . Methylprednisolone Other (See Comments)    Reddened face, puffy face   . Methylprednisolone Swelling and Rash  . Mirtazapine  Dizziness  . Morphine Hallucination  . Rosuvastatin  Other (See Comments)    LFTs increased   . Trulicity [Dulaglutide] Other (See Comments)    Severe stomach pain  . Venlafaxine Other (See Comments)    Light headed, numbness  . Escitalopram  Rash  . Hydroxychloroquine Rash  . Mometasone  Furoate Rash    Facial rash    Physical Exam  Phys Exam Test Results     12/30/2023-ECG-Normal sinus rhythm  Normal ECG                 Patient Instructions      Medication Sig  INSTRUCTIONS  . albuterol  90 mcg/actuation inhaler Inhale 2 inhalations into the lungs every 4 (four) hours as needed TAKE day of procedure, if needed  . ALPRAZolam  (XANAX ) 0.5 MG tablet TAKE ONE TABLET FOUR TIMES DAILY AS NEEDED FOR ANXIETY TAKE day of procedure  . apixaban (ELIQUIS) 5 mg tablet Take 1 tablet (5 mg total) by mouth every 12 (twelve) hours TAKE day of procedure  . azelastine  (ASTELIN ) 137 mcg nasal spray Place 2 sprays into both nostrils 2 (two) times daily TAKE day of procedure, if needed  . baclofen  (LIORESAL ) 10 MG tablet Take 10 mg by mouth once daily as needed TAKE day of procedure, if needed  . candesartan (ATACAND) 8 MG tablet Take 0.5 tablets (4 mg total) by mouth once daily HOLD NIGHT BEFORE PROCEDURE   . diclofenac (VOLTAREN) 1 % topical gel Apply 4 g topically 3 (three) times daily as needed DO NOT TAKE day of procedure  . ezetimibe (ZETIA) 10 mg tablet Take 1 tablet (10 mg total) by mouth once daily TAKE night before procedure  . fluticasone  propion-salmeteroL (ADVAIR DISKUS) 500-50 mcg/dose diskus inhaler Inhale 1 Puff into the lungs every 12 (twelve) hours TAKE day of procedure  . hydrocortisone 2.5 % cream 1(ONE) APPLICATION(S) TOPICAL 2(TWO) TIMES A DAY DO NOT TAKE day of procedure  . insulin  ASPART (NOVOLOG  FLEXPEN U-100 INSULIN ) pen injector (concentration 100 units/mL) Inject 5 Units subcutaneously 3 (three) times daily with meals SSI DO NOT TAKE day of procedure  . ketoconazole (NIZORAL) 2 % shampoo once daily as needed DO NOT TAKE day of procedure  . metoprolol  SUCCinate (TOPROL -XL) 50 MG XL tablet Take 1 tablet (50 mg total) by mouth once daily TAKE night before procedure  . ondansetron  (ZOFRAN ) 4 MG tablet Take 1 tablet (4 mg total) by mouth once daily as needed TAKE day of procedure, if needed  . pantoprazole  (PROTONIX ) 40 MG DR tablet Take 40 mg by mouth once daily TAKE day of procedure  . rosuvastatin  (CRESTOR ) 5 MG tablet Take 1 tablet (5 mg total) by mouth  once daily TAKE night before procedure  . TRESIBA FLEXTOUCH U-200 pen injector (concentration 200 units/mL) Inject 12 Units subcutaneously  once daily TAKE 8 UNITS MORNING OF PROCEDURE   . ursodioL (URSO FORTE) 500 MG tablet TAEK 2 TAB BY MOUTH ONCE DAILY TAKE day of procedure  Prior to your procedure:  Do not wear any jewelry, make-up, nail polish, powders, lotions or deodorant day of surgery. Please remove your dentures (if applicable), all piercings and contact lenses. DO NOT bring valuable items (jewelry or money) to the hospital.  The hospital is not responsible for lost, stolen or damaged items. We recommend brushing your teeth thoroughly the morning of procedure. BRING your picture ID and insurance card the day of surgery. Bring your CPAP if you have sleep apnea and use one. Please avoid alcohol and tobacco use prior to your procedure. Please do not bring food or drinks to the waiting area or while in preop. Please avoid use of your cell phone while in the preop area.  Please use an antibacterial/antimicrobial soap, such as Dial, to shower the night before procedure and the day of procedure.  No ointments/creams/lotions/deodorant on the day of procedure.   After discharge, please contact your surgeon or primary care provider with any questions or concerns that arise after your procedure.   Problem List   Patient Active Problem List  Diagnosis  . history of breast cancer  . Mixed emotional features as adjustment reaction  . Allergic urticaria  . 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CMS/HHS-HCC)  . Blood clotting disorder (CMS/HHS-HCC)  . Myalgia and myositis  . Sicca syndrome (CMS/HHS-HCC)  . Obstructive sleep apnea  . Esophageal reflux  . Palpitations  . SOB (shortness of breath) on exertion  . Neuromyositis  . Fibromyalgia  . Dry eye syndrome, bilateral  . Mouth dryness  . Polyarthralgia  . Type 2 diabetes mellitus with hyperglycemia, with long-term current use of  insulin  (CMS/HHS-HCC)  . Vitamin D  deficiency  . Hyperlipidemia associated with type 2 diabetes mellitus (CMS/HHS-HCC)  . Skin lesion  . Exertional chest pain  . 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 Pelvic pain in female  . Abnormal SPEP  . Allergic rhinitis  . Anxiety and depression  . Bone lesion  . Chronic fatigue  . Dysuria  . Elevated aldolase level  . Elevated LFTs  . Former smoker  . Hearing loss  . Hypertension  . Iliotibial band syndrome  . Insomnia  . Memory difficulty  . Obesity (BMI 30.0-34.9)  . Primary biliary cirrhosis (CMS/HHS-HCC)  . PVC's (premature ventricular contractions)  . Restrictive lung disease  . Throat fullness  . Tick bite of back  . Osteoarthritis  . Vaginal yeast infection  . Statin intolerance  . 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 Paroxysmal atrial fibrillation (CMS/HHS-HCC)  . Atypical chest pain   Assessment & Plan  ASA: 3 Anesthesia options discussed: MAC and Local   ECHO scheduled tomorrow   Overall Assessment  No further medical assessment or optimization required to proceeding with procedure. The patient has no outstanding questions or concerns.  Instructions for day of procedure, importance of adhering to NPO guidelines, and pain management discussed with written instruction given to patient. The Patient verbalized understanding.  The patient was given the opportunity to ask questions, all of which were answered. The patient is satisfied with the anesthesia plan. After discussing the risks and benefits of the anesthesia, the patient provided written consent.  This telephone encounter was conducted with the patient's (or proxy's) verbal consent via secure, interactive audio telecommunications while in clinic/office/hospital.  The patient (or proxy) was instructed to have this encounter in a suitably private space and to only have persons present to whom they give  permission to participate. In addition, the patient identity was confirmed by use of name plus an additional identifier.  I spent 35 minutes with patient and/or family on this phone visit today.  This visit was coded based on medical decision making (MDM).    Day of Surgery  Airway: Mallampati: II - soft palate, uvula, fauces visible. TM distance: >3 FB. Mouth opening: Normal. Neck ROM: Full. Dental: Normal. Simple airway Cardiovasc: Rhythm: Regular. Rate: Normal.   Pulmonary: Normal  Plan ASA: 3 Plan: MAC.  Induction: N/A.  Maintenance: Intravenous sedation.    Patient identification and NPO status confirmed, chart reviewed (medical history, including anesthesia, drug and allergy history), and patient examined.  Anesthesia care plan, including a plan for postoperative pain control, analgesia regimen, and postoperative disposition discussed with patient and/or Parent/legal guardian prior to start of anesthesia care. Patient and/or Parent/legal guardian understands that there are anesthetic risks associated with this procedure and wishes to proceed.   SABRA

## 2024-03-25 NOTE — Progress Notes (Signed)
#   Pseudophakia OS - S/p CE/IOL/Lensx/Toric 03/25/24 - Doing well, wounds sealed, lens in good position, IOP normal * Sleep in shield for 1 more week * Use drops as directed * No heavy lifting or bending * Use artificial tears for dryness/tearing * Return precautions discussed for changes in vision, redness or pain  RTC for POM#1, MRX fellow ok  # Pseudophakia OD - S/p CE/IOL/Lensx/Toric 03/11/24 - Doing well - Use drops as directed  Amblyopia OD. Pt aware vision may be limited after surgery.   ? H/o transient visual obscuration OS - negative w/u w neurologist

## 2024-03-25 NOTE — Progress Notes (Signed)
 Anesthesia Transfer of Care Note  Patient: Kristina Mccoy  Procedures performed: Left - LenSx/Toric/ORA - EXTRACAPSULAR CATARACT ECCE REMOVAL WITH INSERTION OF INTRAOCULAR LENS PROSTHESIS (1 STAGE PROCEDURE), MANUAL OR MECHANICAL TECHNIQUE; WITHOUT ENDOSCOPIC CYCLOPHOTOCOAGULATION (Left: Eye)  Report given/handover to: PACU Nurse

## 2024-03-25 NOTE — Anesthesia Postprocedure Evaluation (Signed)
 Discharge from Anesthesia Care  Patient: Kristina Mccoy  Procedures performed: Left - LenSx/Toric/ORA - EXTRACAPSULAR CATARACT ECCE REMOVAL WITH INSERTION OF INTRAOCULAR LENS PROSTHESIS (1 STAGE PROCEDURE), MANUAL OR MECHANICAL TECHNIQUE; WITHOUT ENDOSCOPIC CYCLOPHOTOCOAGULATION (Left: Eye)  Anesthesia type: MAC Vitals Value Taken Time  BP 108/55 03/25/24 08:35  Temp 36.2 C (97.2 F) 03/25/24 08:35  Pulse 65 03/25/24 08:35  Resp 14 03/25/24 08:35  SpO2 98 % 03/25/24 08:35   *If vital value is absent from grid, please refer to corresponding flowsheet vitals data  Anesthesia Post Evaluation  Patient participation: complete - patient participated Level of consciousness: awake and alert Pain management: adequate Airway patency: patent Respiratory status: acceptable Cardiovascular status: acceptable Hydration status: acceptable Comments: Medical discharge criteria to home or [facility] have been met.    Recent Flowsheet Information: Vitals Value Taken Time  Respiratory (WDL) WDL 03/25/24 08:00  Respiratory Pattern    Musculoskeletal (WDL) WDL 03/25/24 08:00  Neuro (WDL) WDL 03/25/24 08:00  Level of Consciousness    Orientation Level    Pain Assessment %% 0-10 03/25/24 08:35  Pain Score Zero 03/25/24 08:35  Wong-Baker FACES Pain Rating    Nausea Status Absent 03/25/24 08:35  Vomiting Status None 03/25/24 08:35  Gastrointestinal (WDL) WDL 03/25/24 08:00  GI Symptoms

## 2024-03-30 DIAGNOSIS — F4312 Post-traumatic stress disorder, chronic: Secondary | ICD-10-CM | POA: Diagnosis not present

## 2024-03-30 DIAGNOSIS — F331 Major depressive disorder, recurrent, moderate: Secondary | ICD-10-CM | POA: Diagnosis not present

## 2024-03-30 DIAGNOSIS — F411 Generalized anxiety disorder: Secondary | ICD-10-CM | POA: Diagnosis not present

## 2024-04-01 ENCOUNTER — Encounter: Payer: Self-pay | Admitting: Pharmacist

## 2024-04-01 NOTE — Progress Notes (Signed)
 Brief Telephone Documentation Reason for Call: Patient left message regarding question on her PAP shipment  Summary of Call: States she has received her Novo shipment from clinic including her insulin  though realized she did not receive Novofine pen needles as documented.   Follow Up: Clinic team messaged regarding patient question. Clarification needed: Which medication(s) were picked up. Is pen needle prescription from [facility] still present in clinic for pickup?  Manuelita FABIENE Kobs, PharmD Clinical Pharmacist [facility] [phone]

## 2024-04-07 ENCOUNTER — Other Ambulatory Visit: Payer: Self-pay | Admitting: Acute Care

## 2024-04-07 DIAGNOSIS — Z87891 Personal history of nicotine dependence: Secondary | ICD-10-CM

## 2024-04-07 DIAGNOSIS — Z122 Encounter for screening for malignant neoplasm of respiratory organs: Secondary | ICD-10-CM

## 2024-04-08 ENCOUNTER — Encounter: Payer: Self-pay | Admitting: Family Medicine

## 2024-04-08 ENCOUNTER — Encounter: Payer: Self-pay | Admitting: Pharmacist

## 2024-04-08 NOTE — Progress Notes (Signed)
 Brief Telephone Documentation Reason for Call: Clarification on PAP shipment  Summary of Call: Picked up Novo PAP shipment end of June though received no pen needles.   Pen needles for patient have been located and placed in front office for pickup.  Discussed with patient 04/08/24. Verbalizes understanding.   Discussed PAP provider change request was sent to Endo, Dr. Ziglar again in June with instructions to fax to [employer] once signed.   Kristina Mccoy, PharmD Clinical Pharmacist [facility] [phone]

## 2024-04-14 DIAGNOSIS — F411 Generalized anxiety disorder: Secondary | ICD-10-CM | POA: Diagnosis not present

## 2024-04-14 DIAGNOSIS — F331 Major depressive disorder, recurrent, moderate: Secondary | ICD-10-CM | POA: Diagnosis not present

## 2024-04-16 DIAGNOSIS — L218 Other seborrheic dermatitis: Secondary | ICD-10-CM | POA: Diagnosis not present

## 2024-04-16 DIAGNOSIS — Z86018 Personal history of other benign neoplasm: Secondary | ICD-10-CM | POA: Diagnosis not present

## 2024-04-16 DIAGNOSIS — L578 Other skin changes due to chronic exposure to nonionizing radiation: Secondary | ICD-10-CM | POA: Diagnosis not present

## 2024-04-16 DIAGNOSIS — L508 Other urticaria: Secondary | ICD-10-CM | POA: Diagnosis not present

## 2024-04-16 DIAGNOSIS — Z85820 Personal history of malignant melanoma of skin: Secondary | ICD-10-CM | POA: Diagnosis not present

## 2024-04-16 DIAGNOSIS — L570 Actinic keratosis: Secondary | ICD-10-CM | POA: Diagnosis not present

## 2024-04-21 ENCOUNTER — Other Ambulatory Visit: Payer: Self-pay | Admitting: Family Medicine

## 2024-04-21 ENCOUNTER — Encounter: Payer: Self-pay | Admitting: Family Medicine

## 2024-04-21 DIAGNOSIS — F331 Major depressive disorder, recurrent, moderate: Secondary | ICD-10-CM | POA: Diagnosis not present

## 2024-04-21 DIAGNOSIS — F411 Generalized anxiety disorder: Secondary | ICD-10-CM | POA: Diagnosis not present

## 2024-04-21 DIAGNOSIS — F4312 Post-traumatic stress disorder, chronic: Secondary | ICD-10-CM | POA: Diagnosis not present

## 2024-04-21 DIAGNOSIS — J309 Allergic rhinitis, unspecified: Secondary | ICD-10-CM

## 2024-04-22 DIAGNOSIS — R0602 Shortness of breath: Secondary | ICD-10-CM | POA: Diagnosis not present

## 2024-04-22 DIAGNOSIS — I2510 Atherosclerotic heart disease of native coronary artery without angina pectoris: Secondary | ICD-10-CM | POA: Diagnosis not present

## 2024-04-22 DIAGNOSIS — R931 Abnormal findings on diagnostic imaging of heart and coronary circulation: Secondary | ICD-10-CM | POA: Diagnosis not present

## 2024-04-22 DIAGNOSIS — I480 Paroxysmal atrial fibrillation: Secondary | ICD-10-CM | POA: Diagnosis not present

## 2024-04-22 DIAGNOSIS — Z136 Encounter for screening for cardiovascular disorders: Secondary | ICD-10-CM | POA: Diagnosis not present

## 2024-04-22 DIAGNOSIS — R002 Palpitations: Secondary | ICD-10-CM | POA: Diagnosis not present

## 2024-04-22 DIAGNOSIS — E1169 Type 2 diabetes mellitus with other specified complication: Secondary | ICD-10-CM | POA: Diagnosis not present

## 2024-04-22 DIAGNOSIS — R0789 Other chest pain: Secondary | ICD-10-CM | POA: Diagnosis not present

## 2024-04-22 DIAGNOSIS — I219 Acute myocardial infarction, unspecified: Secondary | ICD-10-CM | POA: Diagnosis not present

## 2024-04-22 DIAGNOSIS — I493 Ventricular premature depolarization: Secondary | ICD-10-CM | POA: Diagnosis not present

## 2024-04-22 DIAGNOSIS — G453 Amaurosis fugax: Secondary | ICD-10-CM | POA: Diagnosis not present

## 2024-04-22 DIAGNOSIS — I10 Essential (primary) hypertension: Secondary | ICD-10-CM | POA: Diagnosis not present

## 2024-04-23 ENCOUNTER — Encounter: Payer: Self-pay | Admitting: Family Medicine

## 2024-04-25 ENCOUNTER — Encounter: Payer: Self-pay | Admitting: Internal Medicine

## 2024-04-25 DIAGNOSIS — R0602 Shortness of breath: Secondary | ICD-10-CM | POA: Diagnosis not present

## 2024-04-25 DIAGNOSIS — R0789 Other chest pain: Secondary | ICD-10-CM | POA: Diagnosis not present

## 2024-04-25 DIAGNOSIS — I219 Acute myocardial infarction, unspecified: Secondary | ICD-10-CM | POA: Diagnosis not present

## 2024-04-25 DIAGNOSIS — E1169 Type 2 diabetes mellitus with other specified complication: Secondary | ICD-10-CM | POA: Diagnosis not present

## 2024-04-25 DIAGNOSIS — I493 Ventricular premature depolarization: Secondary | ICD-10-CM | POA: Diagnosis not present

## 2024-04-25 DIAGNOSIS — I480 Paroxysmal atrial fibrillation: Secondary | ICD-10-CM | POA: Diagnosis not present

## 2024-04-25 DIAGNOSIS — I10 Essential (primary) hypertension: Secondary | ICD-10-CM | POA: Diagnosis not present

## 2024-04-25 DIAGNOSIS — E785 Hyperlipidemia, unspecified: Secondary | ICD-10-CM | POA: Diagnosis not present

## 2024-04-25 DIAGNOSIS — G453 Amaurosis fugax: Secondary | ICD-10-CM | POA: Diagnosis not present

## 2024-04-25 DIAGNOSIS — R002 Palpitations: Secondary | ICD-10-CM | POA: Diagnosis not present

## 2024-04-26 ENCOUNTER — Ambulatory Visit (INDEPENDENT_AMBULATORY_CARE_PROVIDER_SITE_OTHER): Admitting: Family Medicine

## 2024-04-26 ENCOUNTER — Encounter: Payer: Self-pay | Admitting: Family Medicine

## 2024-04-26 VITALS — BP 138/83 | HR 93 | Resp 16 | Ht 64.0 in | Wt 174.5 lb

## 2024-04-26 DIAGNOSIS — M542 Cervicalgia: Secondary | ICD-10-CM | POA: Diagnosis not present

## 2024-04-26 DIAGNOSIS — I480 Paroxysmal atrial fibrillation: Secondary | ICD-10-CM | POA: Diagnosis not present

## 2024-04-26 DIAGNOSIS — M5412 Radiculopathy, cervical region: Secondary | ICD-10-CM | POA: Diagnosis not present

## 2024-04-26 MED ORDER — HYDROCODONE-ACETAMINOPHEN 5-325 MG PO TABS: 1.0000 | ORAL_TABLET | Freq: Four times a day (QID) | ORAL | 0 refills | Status: DC | PRN

## 2024-04-26 NOTE — Assessment & Plan Note (Signed)
 She has significant cervical radiculopathy from C3-C7.  She has been offered surgical repair but has declined this.  Last fill date 09/26/2023 hydrocodone  5/325mg   #15.  Refilled that today.

## 2024-04-26 NOTE — Telephone Encounter (Signed)
 [employer] requested submitted for CTA images from [facility].

## 2024-04-26 NOTE — Telephone Encounter (Signed)
 She has a follow-up lung cancer screening CT scheduled for 5 August and this will clarify this issue.  The reading from the [facility] scan is likely a typo given that the range of the nodule was given as 11 to 4 mm which is a broad range and does not clarify the size of the nodule.  I suspect it is closer to 4 mm which is very small.  Images have been procured so that Dr. Isaiah can review them and compare to her 5 August lung cancer screening CT.

## 2024-04-26 NOTE — Assessment & Plan Note (Signed)
 She is on Eliquis 5 mg twice daily which precludes the use of anti-inflammatories

## 2024-04-26 NOTE — Progress Notes (Signed)
   Established Patient Office Visit  Subjective   Patient ID: Kristina Mccoy, female    DOB: 1956/01/22  Age: 68 y.o. MRN: [id number]  Chief Complaint  Patient presents with   Neck Pain    Neck Pain   Kristina Mccoy 68 year old with Sjogren's syndrome/sicca syndrome (Dr. Olam Heron Bustle), PAF (Dr. Kayla Chance), amaurosis fugax, PUD, DMT2, HTN, mixed hyperlipidemia, CAD (MI 2023 detected on cardiac nuclear scan), DDD C3-C7 with cord compression, melanoma, anxiety, breast cancer (bilateral mastectomy Dr. Truman Bullock), primary biliary cholangitis, interstitial lung disease (Dr. Dayla).  Colonoscopy 04/22/2017 no polyps (Cologuard 1 year ago negative).  She has been experiencing angina with certain activities.  Had a CT heart angiogram and the blockages were found.  She is scheduled for cardiac catheterization tomorrow. She is in the office today requesting hydrocodone  for neck pain.  She is anticoagulated with Eliquis 5 mg twice daily which precludes the use of anti-inflammatories. She last had a prescription for hydrocodone  01/31/2024 #15 09/26/2023 for her neck pain. She has recently had bilateral cataracts with lens implants and her vision is much better.   Review of Systems  Musculoskeletal:  Positive for neck pain.      Objective:     BP 138/83   Pulse 93   Resp 16   Ht 5' 4 (1.626 m)   Wt 174 lb 8 oz (79.2 kg)   SpO2 98%   BMI 29.95 kg/m    Physical Exam Vitals and nursing note reviewed.  Constitutional:      Appearance: Normal appearance.  HENT:     Head: Normocephalic and atraumatic.  Eyes:     Conjunctiva/sclera: Conjunctivae normal.  Cardiovascular:     Rate and Rhythm: Normal rate and regular rhythm.  Pulmonary:     Effort: Pulmonary effort is normal.     Breath sounds: Normal breath sounds.  Musculoskeletal:     Right lower leg: No edema.     Left lower leg: No edema.  Skin:    General: Skin is warm and dry.  Neurological:     Mental Status:  She is alert and oriented to person, place, and time.  Psychiatric:        Mood and Affect: Mood normal.        Behavior: Behavior normal.        Thought Content: Thought content normal.        Judgment: Judgment normal.          No results found for any visits on 04/26/24.    The ASCVD Risk score (Arnett DK, et al., 2019) failed to calculate for the following reasons:   Risk score cannot be calculated because patient has a medical history suggesting prior/existing ASCVD    Assessment & Plan:  Neck pain -     HYDROcodone -Acetaminophen ; Take 1 tablet by mouth every 6 (six) hours as needed for moderate pain (pain score 4-6).  Dispense: 15 tablet; Refill: 0  Cervical radiculopathy Assessment & Plan: She has significant cervical radiculopathy from C3-C7.  She has been offered surgical repair but has declined this.  Last fill date 09/26/2023 hydrocodone  5/325mg   #15.  Refilled that today.   Paroxysmal atrial fibrillation [facility]) Assessment & Plan: She is on Eliquis 5 mg twice daily which precludes the use of anti-inflammatories      Return Has an appointment 06/18/24.    Kristina Geers K Surya Folden, MD

## 2024-04-27 DIAGNOSIS — E1165 Type 2 diabetes mellitus with hyperglycemia: Secondary | ICD-10-CM | POA: Diagnosis not present

## 2024-04-27 DIAGNOSIS — K279 Peptic ulcer, site unspecified, unspecified as acute or chronic, without hemorrhage or perforation: Secondary | ICD-10-CM | POA: Diagnosis not present

## 2024-04-27 DIAGNOSIS — E785 Hyperlipidemia, unspecified: Secondary | ICD-10-CM | POA: Diagnosis not present

## 2024-04-27 DIAGNOSIS — Z7901 Long term (current) use of anticoagulants: Secondary | ICD-10-CM | POA: Diagnosis not present

## 2024-04-27 DIAGNOSIS — I25110 Atherosclerotic heart disease of native coronary artery with unstable angina pectoris: Secondary | ICD-10-CM | POA: Diagnosis not present

## 2024-04-27 DIAGNOSIS — Z888 Allergy status to other drugs, medicaments and biological substances status: Secondary | ICD-10-CM | POA: Diagnosis not present

## 2024-04-27 DIAGNOSIS — I10 Essential (primary) hypertension: Secondary | ICD-10-CM | POA: Diagnosis not present

## 2024-04-27 DIAGNOSIS — E876 Hypokalemia: Secondary | ICD-10-CM | POA: Diagnosis not present

## 2024-04-27 DIAGNOSIS — Z87891 Personal history of nicotine dependence: Secondary | ICD-10-CM | POA: Diagnosis not present

## 2024-04-27 DIAGNOSIS — F419 Anxiety disorder, unspecified: Secondary | ICD-10-CM | POA: Diagnosis not present

## 2024-04-27 DIAGNOSIS — R7982 Elevated C-reactive protein (CRP): Secondary | ICD-10-CM | POA: Diagnosis not present

## 2024-04-27 DIAGNOSIS — Z79899 Other long term (current) drug therapy: Secondary | ICD-10-CM | POA: Diagnosis not present

## 2024-04-27 DIAGNOSIS — Z9013 Acquired absence of bilateral breasts and nipples: Secondary | ICD-10-CM | POA: Diagnosis not present

## 2024-04-27 DIAGNOSIS — M3500 Sicca syndrome, unspecified: Secondary | ICD-10-CM | POA: Diagnosis not present

## 2024-04-27 DIAGNOSIS — E119 Type 2 diabetes mellitus without complications: Secondary | ICD-10-CM | POA: Diagnosis not present

## 2024-04-27 DIAGNOSIS — I219 Acute myocardial infarction, unspecified: Secondary | ICD-10-CM | POA: Diagnosis not present

## 2024-04-27 DIAGNOSIS — I480 Paroxysmal atrial fibrillation: Secondary | ICD-10-CM | POA: Diagnosis not present

## 2024-04-27 DIAGNOSIS — I25118 Atherosclerotic heart disease of native coronary artery with other forms of angina pectoris: Secondary | ICD-10-CM | POA: Diagnosis not present

## 2024-04-27 DIAGNOSIS — Z883 Allergy status to other anti-infective agents status: Secondary | ICD-10-CM | POA: Diagnosis not present

## 2024-04-27 DIAGNOSIS — I252 Old myocardial infarction: Secondary | ICD-10-CM | POA: Diagnosis not present

## 2024-04-27 DIAGNOSIS — Z7902 Long term (current) use of antithrombotics/antiplatelets: Secondary | ICD-10-CM | POA: Diagnosis not present

## 2024-04-27 DIAGNOSIS — E8809 Other disorders of plasma-protein metabolism, not elsewhere classified: Secondary | ICD-10-CM | POA: Diagnosis not present

## 2024-04-27 DIAGNOSIS — L03113 Cellulitis of right upper limb: Secondary | ICD-10-CM | POA: Diagnosis not present

## 2024-04-27 DIAGNOSIS — L03115 Cellulitis of right lower limb: Secondary | ICD-10-CM | POA: Diagnosis not present

## 2024-04-27 DIAGNOSIS — Z794 Long term (current) use of insulin: Secondary | ICD-10-CM | POA: Diagnosis not present

## 2024-04-27 DIAGNOSIS — R0602 Shortness of breath: Secondary | ICD-10-CM | POA: Diagnosis not present

## 2024-04-27 DIAGNOSIS — G453 Amaurosis fugax: Secondary | ICD-10-CM | POA: Diagnosis not present

## 2024-04-27 DIAGNOSIS — I493 Ventricular premature depolarization: Secondary | ICD-10-CM | POA: Diagnosis not present

## 2024-04-27 DIAGNOSIS — M7989 Other specified soft tissue disorders: Secondary | ICD-10-CM | POA: Diagnosis not present

## 2024-04-27 DIAGNOSIS — Z885 Allergy status to narcotic agent status: Secondary | ICD-10-CM | POA: Diagnosis not present

## 2024-04-27 DIAGNOSIS — E1169 Type 2 diabetes mellitus with other specified complication: Secondary | ICD-10-CM | POA: Diagnosis not present

## 2024-04-27 DIAGNOSIS — E871 Hypo-osmolality and hyponatremia: Secondary | ICD-10-CM | POA: Diagnosis not present

## 2024-04-27 DIAGNOSIS — K745 Biliary cirrhosis, unspecified: Secondary | ICD-10-CM | POA: Diagnosis not present

## 2024-05-03 ENCOUNTER — Other Ambulatory Visit: Payer: Self-pay | Admitting: Family Medicine

## 2024-05-03 DIAGNOSIS — E785 Hyperlipidemia, unspecified: Secondary | ICD-10-CM | POA: Diagnosis not present

## 2024-05-03 DIAGNOSIS — Z955 Presence of coronary angioplasty implant and graft: Secondary | ICD-10-CM | POA: Diagnosis not present

## 2024-05-03 DIAGNOSIS — M3500 Sicca syndrome, unspecified: Secondary | ICD-10-CM | POA: Diagnosis not present

## 2024-05-03 DIAGNOSIS — E1310 Other specified diabetes mellitus with ketoacidosis without coma: Secondary | ICD-10-CM | POA: Diagnosis not present

## 2024-05-03 DIAGNOSIS — F419 Anxiety disorder, unspecified: Secondary | ICD-10-CM

## 2024-05-03 DIAGNOSIS — I25118 Atherosclerotic heart disease of native coronary artery with other forms of angina pectoris: Secondary | ICD-10-CM | POA: Diagnosis not present

## 2024-05-04 ENCOUNTER — Ambulatory Visit

## 2024-05-04 DIAGNOSIS — Z955 Presence of coronary angioplasty implant and graft: Secondary | ICD-10-CM | POA: Diagnosis not present

## 2024-05-04 DIAGNOSIS — R0789 Other chest pain: Secondary | ICD-10-CM | POA: Diagnosis not present

## 2024-05-04 DIAGNOSIS — I480 Paroxysmal atrial fibrillation: Secondary | ICD-10-CM | POA: Diagnosis not present

## 2024-05-04 DIAGNOSIS — I10 Essential (primary) hypertension: Secondary | ICD-10-CM | POA: Diagnosis not present

## 2024-05-04 DIAGNOSIS — Z789 Other specified health status: Secondary | ICD-10-CM | POA: Diagnosis not present

## 2024-05-04 DIAGNOSIS — E785 Hyperlipidemia, unspecified: Secondary | ICD-10-CM | POA: Diagnosis not present

## 2024-05-04 DIAGNOSIS — I2510 Atherosclerotic heart disease of native coronary artery without angina pectoris: Secondary | ICD-10-CM | POA: Diagnosis not present

## 2024-05-04 DIAGNOSIS — I493 Ventricular premature depolarization: Secondary | ICD-10-CM | POA: Diagnosis not present

## 2024-05-04 DIAGNOSIS — R0602 Shortness of breath: Secondary | ICD-10-CM | POA: Diagnosis not present

## 2024-05-04 DIAGNOSIS — E782 Mixed hyperlipidemia: Secondary | ICD-10-CM | POA: Diagnosis not present

## 2024-05-04 DIAGNOSIS — E1169 Type 2 diabetes mellitus with other specified complication: Secondary | ICD-10-CM | POA: Diagnosis not present

## 2024-05-05 ENCOUNTER — Encounter: Payer: Self-pay | Admitting: Family Medicine

## 2024-05-05 ENCOUNTER — Ambulatory Visit (INDEPENDENT_AMBULATORY_CARE_PROVIDER_SITE_OTHER): Admitting: Family Medicine

## 2024-05-05 VITALS — BP 125/78 | HR 80 | Temp 98.0°F | Resp 18 | Ht 64.0 in | Wt 176.0 lb

## 2024-05-05 DIAGNOSIS — S60212S Contusion of left wrist, sequela: Secondary | ICD-10-CM | POA: Diagnosis not present

## 2024-05-05 DIAGNOSIS — I2510 Atherosclerotic heart disease of native coronary artery without angina pectoris: Secondary | ICD-10-CM | POA: Insufficient documentation

## 2024-05-05 DIAGNOSIS — F32A Depression, unspecified: Secondary | ICD-10-CM | POA: Diagnosis not present

## 2024-05-05 DIAGNOSIS — L03113 Cellulitis of right upper limb: Secondary | ICD-10-CM | POA: Diagnosis not present

## 2024-05-05 DIAGNOSIS — F419 Anxiety disorder, unspecified: Secondary | ICD-10-CM | POA: Diagnosis not present

## 2024-05-05 DIAGNOSIS — F4312 Post-traumatic stress disorder, chronic: Secondary | ICD-10-CM | POA: Diagnosis not present

## 2024-05-05 DIAGNOSIS — E782 Mixed hyperlipidemia: Secondary | ICD-10-CM

## 2024-05-05 DIAGNOSIS — F331 Major depressive disorder, recurrent, moderate: Secondary | ICD-10-CM | POA: Diagnosis not present

## 2024-05-05 DIAGNOSIS — F411 Generalized anxiety disorder: Secondary | ICD-10-CM | POA: Diagnosis not present

## 2024-05-05 MED ORDER — ALPRAZOLAM 0.5 MG PO TABS: ORAL_TABLET | ORAL | 2 refills | Status: DC

## 2024-05-05 NOTE — Assessment & Plan Note (Signed)
 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  Treated with IV ABX and finished with cefadroxil.  Infection appears resolved.

## 2024-05-05 NOTE — Assessment & Plan Note (Signed)
 Has a large hematoma at the site of her cardiac cath in left wrist.  Encouraged her to use Arnica cream.

## 2024-05-05 NOTE — Progress Notes (Signed)
 Established Patient Office Visit  Subjective   Patient ID: Kristina Mccoy, female    DOB: 10-23-1955  Age: 69 y.o. MRN: [id number]  Chief Complaint  Patient presents with   Hospitalization Follow-up    HPI [name] 68 year old with Sjogren's syndrome/sicca syndrome (Dr. Olam Heron Bustle), PAF (Dr. Kayla Chance), amaurosis fugax, PUD, DMT2, HTN, mixed hyperlipidemia, CAD (MI 2023 detected on cardiac nuclear scan), DDD C3-C7 with cord compression, melanoma, anxiety, breast cancer (bilateral mastectomies and lymph node removal. Dr. Truman Bullock), primary biliary cholangitis, interstitial lung disease (Dr. Dayla). Colonoscopy 04/22/2017 no polyps (Cologuard 1 year ago negative).   Patient was admitted to [facility] 04/27/2024 with rigor, cellulitis and lymphedema right upper extremity.  (She has had bilateral mastectomies and the lymph nodes were removed from her right arm and right anterior chest).  At admission her WBC was 24K.  She was treated with IV antibiotics.  Discharged on cefadroxil for 2 additional days.  Her right arm has gradually improved and  it is no longer painful, erythematous or warm.  She underwent a left heart cath 04/29/2024.  She had PCI and placement of a DES at the mid RCA where she had an 80% blockage.  Cardiology has added Plavix 75 mg (already takes Eliquis 5 mg twice daily) and Farxiga.  They have also asked her to increase her Crestor  from 10 mg to 20 mg daily.    A1c during her hospitalization was 7.7%.  She is on [place] 12 units daily.    ROS    Objective:     BP 125/78   Pulse 80   Temp 98 F (36.7 C) (Oral)   Resp 18   Ht 5' 4 (1.626 m)   Wt 176 lb (79.8 kg)   SpO2 98%   BMI 30.21 kg/m    Physical Exam Vitals and nursing note reviewed.  Constitutional:      Appearance: Normal appearance.  HENT:     Head: Normocephalic and atraumatic.  Eyes:     Conjunctiva/sclera: Conjunctivae normal.  Cardiovascular:      Rate and Rhythm: Normal rate and regular rhythm.  Pulmonary:     Effort: Pulmonary effort is normal.     Breath sounds: Normal breath sounds.  Musculoskeletal:     Right lower leg: No edema.     Left lower leg: No edema.  Skin:    General: Skin is warm and dry.     Comments: Still some puffiness in her right arm but no erythema or warmth.  Not painful to exam.    Neurological:     Mental Status: She is alert and oriented to person, place, and time.  Psychiatric:        Mood and Affect: Mood normal.        Behavior: Behavior normal.        Thought Content: Thought content normal.        Judgment: Judgment normal.          No results found for any visits on 05/05/24.    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  Treated with IV ABX and finished with cefadroxil.  Infection appears resolved.     Anxiety and depression -  ALPRAZolam ; TAKE 1 TABLET BY MOUTH 4 TIMES A DAY AS NEEDED FOR ANXIETY  Dispense: 120 tablet; Refill: 2  Traumatic hematoma of wrist, left, sequela Assessment & Plan: Has a large hematoma at the site of her cardiac cath in left wrist.  Encouraged her to use Arnica cream.   Mixed hyperlipidemia Assessment & Plan: She is statin intolerant but takes Zetia 10 mg daily and Crestor  10 mg daily.  04/18/2024 total cholesterol 120, HDL 65, Triggs 52, LDL 45.  Cardiology increased her Crestor  from 10 to 20 mg daily.   Coronary artery disease involving native coronary artery of native heart without angina pectoris Assessment & Plan: She just had PCI and DES stent RCA for an 80% blockage.  Her coronary calcium  score was 504      Return in about 3 months (around 08/05/2024).    Graylen Noboa K  Cleotha Whalin, MD

## 2024-05-05 NOTE — Assessment & Plan Note (Signed)
 She is statin intolerant but takes Zetia 10 mg daily and Crestor  10 mg daily.  04/18/2024 total cholesterol 120, HDL 65, Triggs 52, LDL 45.  Cardiology increased her Crestor  from 10 to 20 mg daily.

## 2024-05-05 NOTE — Assessment & Plan Note (Signed)
 She just had PCI and DES stent RCA for an 80% blockage.  Her coronary calcium  score was 504

## 2024-05-10 ENCOUNTER — Ambulatory Visit: Admitting: Podiatry

## 2024-05-10 ENCOUNTER — Encounter: Payer: Self-pay | Admitting: Podiatry

## 2024-05-10 DIAGNOSIS — M79674 Pain in right toe(s): Secondary | ICD-10-CM | POA: Diagnosis not present

## 2024-05-10 DIAGNOSIS — B351 Tinea unguium: Secondary | ICD-10-CM | POA: Diagnosis not present

## 2024-05-10 DIAGNOSIS — D689 Coagulation defect, unspecified: Secondary | ICD-10-CM | POA: Diagnosis not present

## 2024-05-10 DIAGNOSIS — M79675 Pain in left toe(s): Secondary | ICD-10-CM

## 2024-05-10 DIAGNOSIS — E109 Type 1 diabetes mellitus without complications: Secondary | ICD-10-CM

## 2024-05-11 DIAGNOSIS — F331 Major depressive disorder, recurrent, moderate: Secondary | ICD-10-CM | POA: Diagnosis not present

## 2024-05-11 DIAGNOSIS — F4312 Post-traumatic stress disorder, chronic: Secondary | ICD-10-CM | POA: Diagnosis not present

## 2024-05-11 DIAGNOSIS — F411 Generalized anxiety disorder: Secondary | ICD-10-CM | POA: Diagnosis not present

## 2024-05-13 ENCOUNTER — Ambulatory Visit

## 2024-05-16 NOTE — Progress Notes (Signed)
 Subjective:  Patient ID: Kristina Mccoy, female    DOB: 28-Aug-1956,  MRN: [id number]  Kristina Mccoy presents to clinic today for preventative diabetic foot care.  Chief Complaint  Patient presents with   [facility]    Rm2 Diabetic foot care/ Dr. Ziglar last visit August 2025/ Blood sugar 215/ A1c 7.5   New problem(s): None.   PCP is Ziglar, Devere POUR, MD.  Allergies  Allergen Reactions   Gabapentin  Itching and Swelling   Sodium Fluoride Rash    Burning sensation on lips and redness   Wound Dressing Adhesive Dermatitis, Itching and Rash    Ingredient - Acrylic   Oxycodone  Itching and Rash   Repatha  [Evolocumab ] Other (See Comments)    abdominal distention, headache, higher blood sugars, facial skin peeling   Carbamazepine Nausea And Vomiting and Other (See Comments)    Abdominal pain    Doxycycline  Other (See Comments)   Dulaglutide     Other reaction(s): Other (See Comments) Severe stomach pain   Ezetimibe Other (See Comments)    myalgias   Insulin  Glargine Other (See Comments)    BLOATING, MUSCLE/JOINT PAIN   Levaquin  [Levofloxacin ]     Diarrhea and pain in calf   Metformin     Other reaction(s): Other (See Comments) Cramping- abdominal pain   Methylprednisolone     Reddened face, puffy face    Mirtazapine     Morphine Other (See Comments)   Pregabalin  Swelling    Swelling in arms and legs   Rosuvastatin  Other (See Comments)    LFTs increased    Venlafaxine Other (See Comments)   Buprenorphine Rash and Swelling   Canagliflozin  Other (See Comments) and Rash    YEAST INFECTIONS   Escitalopram  Rash   Hydroxychloroquine Rash    Review of Systems: Negative except as noted in the HPI.  Objective: No changes noted in today's physical examination. There were no vitals filed for this visit. Kristina Mccoy is a pleasant 68 y.o. female WD, WN in NAD. AAO x 3.  Vascular Examination: Capillary refill time immediate b/l. Vascular status intact b/l with palpable pedal  pulses. Pedal hair present b/l. No pain with calf compression b/l. Skin temperature gradient WNL b/l. No cyanosis or clubbing b/l. No ischemia or gangrene noted b/l.   Neurological Examination: Sensation grossly intact b/l with 10 gram monofilament. Vibratory sensation intact b/l.   Dermatological Examination: Pedal skin with normal turgor, texture and tone b/l.  No open wounds. No interdigital macerations.   Toenails right hallux thick, discolored, elongated with subungual debris and pain on dorsal palpation.   No hyperkeratotic nor porokeratotic lesions.  Nondystrophic toenails 2-5 bilaterally and L hallux.  Musculoskeletal Examination: Muscle strength 5/5 to all lower extremity muscle groups bilaterally. Palpable fixed, firm subcutaneous lesions on plantar aspect of medial arch RLE consistent with plantar fibromatosis.  Radiographs: None  Last A1c:      Latest Ref Rng & Units 02/10/2024    4:03 PM 09/08/2023   12:00 AM  Hemoglobin A1C  Hemoglobin-A1c 4.0 - 5.6 % 7.9  7.9         This result is from an external source.   Assessment/Plan: 1. Pain due to onychomycosis of toenails of both feet   2. Blood clotting disorder (HCC)   3. Type 1 diabetes mellitus without complication (HCC)     Patient was evaluated and treated. All patient's and/or POA's questions/concerns addressed on today's visit. Toenails right great toe debrided in length and girth without incident.  Continue foot and shoe inspections daily. Monitor blood glucose per PCP/Endocrinologist's recommendations. Continue soft, supportive shoe gear daily. Report any pedal injuries to medical professional. Call office if there are any questions/concerns. -Nondystrophic toenails trimmed 2-5 bilaterally and L hallux. -Patient/POA to call should there be question/concern in the interim.   Return in about 3 months (around 08/10/2024).  Kristina Mccoy, DPM      [place] LOCATION: [address], [place] [address]                   Office [phone]   [facility] LOCATION: [address] [place], [place] [address] Office [phone]

## 2024-05-17 ENCOUNTER — Telehealth: Payer: Self-pay | Admitting: Family Medicine

## 2024-05-17 ENCOUNTER — Other Ambulatory Visit: Payer: Self-pay | Admitting: Family Medicine

## 2024-05-17 DIAGNOSIS — N760 Acute vaginitis: Secondary | ICD-10-CM

## 2024-05-17 MED ORDER — FLUCONAZOLE 150 MG PO TABS: 150.0000 mg | ORAL_TABLET | Freq: Once | ORAL | 0 refills | Status: AC

## 2024-05-17 NOTE — Telephone Encounter (Signed)
 Copied from CRM #[id number]. Topic: Clinical - Medical Advice >> May 14, 2024  2:59 PM Kristina Mccoy wrote: Reason for CRM: getting a yeast infection and is asking for diflucan - having itching and irritation

## 2024-05-18 ENCOUNTER — Ambulatory Visit: Admitting: Family Medicine

## 2024-05-18 ENCOUNTER — Encounter: Payer: Self-pay | Admitting: Family Medicine

## 2024-05-18 VITALS — BP 127/82 | HR 87 | Temp 97.8°F | Resp 18 | Ht 64.0 in | Wt 177.0 lb

## 2024-05-18 DIAGNOSIS — J029 Acute pharyngitis, unspecified: Secondary | ICD-10-CM | POA: Diagnosis not present

## 2024-05-18 DIAGNOSIS — R52 Pain, unspecified: Secondary | ICD-10-CM

## 2024-05-18 DIAGNOSIS — R5381 Other malaise: Secondary | ICD-10-CM

## 2024-05-18 DIAGNOSIS — L03113 Cellulitis of right upper limb: Secondary | ICD-10-CM

## 2024-05-18 LAB — POC COVID19/FLU A&B COMBO
Covid Antigen, POC: NEGATIVE
Influenza A Antigen, POC: NEGATIVE
Influenza B Antigen, POC: NEGATIVE

## 2024-05-18 MED ORDER — CEFADROXIL 500 MG PO CAPS: 500.0000 mg | ORAL_CAPSULE | Freq: Two times a day (BID) | ORAL | 0 refills | Status: DC

## 2024-05-18 NOTE — Progress Notes (Signed)
 Established Patient Office Visit  Subjective   Patient ID: Kristina Mccoy, female    DOB: 03/30/1956  Age: 68 y.o. MRN: [id number]  Chief Complaint  Patient presents with   URI    URI    [name] 68 year old with Sjogren's syndrome/sicca syndrome (Dr. Olam Heron Bustle), PAF (Dr. Kayla Chance), amaurosis fugax, PUD, DMT2, HTN, mixed hyperlipidemia, CAD (MI 2023 detected on cardiac nuclear scan), DDD C3-C7 with cord compression, melanoma, anxiety, breast cancer (bilateral mastectomies and lymph node removal. Dr. Truman Bullock), primary biliary cholangitis, interstitial lung disease (Dr. Dayla). Colonoscopy 04/22/2017 no polyps (Cologuard 1 year ago negative) and recent cellulitis of lymphedema right arm.    Discussed the use of AI scribe software for clinical note transcription with the patient, who gave verbal consent to proceed.  History of Present Illness   Kristina Mccoy is a 68 year old female with cardiovascular issues who presents with intense pain in her arms and legs, and symptoms suggestive of cellulitis.  She has been experiencing intense pain in her arms and legs since Friday, accompanied by a sensation of heat. The pain is severe enough that hydrocodone  did not alleviate it. She also notes dimpling and redness in her right arm and is concerned it may be similar to her previous episodes of cellulitis.  She has a cough, sore throat, and runny nose. Her sore throat is described as scratchy and her runny nose as clear. No fever, but she notes a temperature of 97.76F, which she considers high due to her autoimmune disease. She recalls previous critical care experiences where her temperature was significantly lower.  Recently, she participated in a research study at [facility], where she received a shot. She contacted the nurse on Monday to inquire if her symptoms could be related to the shot.  For her last bout of cellulitis and lymphedema she was treated with  cefadroxil .    She reports a lack of appetite but is eating to manage her blood sugar levels. She also mentions a history of stroke, which has slightly affected her peripheral vision, and she is awaiting eyelid surgery for her peripheral visoin which is delayed due to her current use of Plavix.         ROS    Objective:     BP 127/82   Pulse 87   Temp 97.8 F (36.6 C) (Oral)   Resp 18   Ht 5' 4 (1.626 m)   Wt 177 lb (80.3 kg)   SpO2 97%   BMI 30.38 kg/m    Physical Exam Vitals and nursing note reviewed.  Constitutional:      Appearance: Normal appearance.  HENT:     Head: Normocephalic and atraumatic.  Eyes:     Conjunctiva/sclera: Conjunctivae normal.  Cardiovascular:     Rate and Rhythm: Normal rate and regular rhythm.  Pulmonary:     Effort: Pulmonary effort is normal.     Breath sounds: Normal breath sounds.  Musculoskeletal:     Right lower leg: No edema.     Left lower leg: No edema.  Skin:    General: Skin is warm and dry.  Neurological:     Mental Status: She is alert and oriented to person, place, and time.     Comments: Erythema on patches on right forearm and arm.  Macules are warm and tender to touch.   Psychiatric:        Mood and Affect: Mood normal.  Behavior: Behavior normal.        Thought Content: Thought content normal.        Judgment: Judgment normal.          No results found for any visits on 05/18/24.    The ASCVD Risk score (Arnett DK, et al., 2019) failed to calculate for the following reasons:   Risk score cannot be calculated because patient has a medical history suggesting prior/existing ASCVD    Assessment & Plan:  Generalized pain -     POC Covid19/Flu A&B Antigen  Malaise -     POC Covid19/Flu A&B Antigen  Sore throat -     POC Covid19/Flu A&B Antigen  Cellulitis of right upper extremity -     Cefadroxil ; Take 1 capsule (500 mg total) by mouth 2 (two) times daily.  Dispense: 14 capsule; Refill:  0  Cellulitis of arm, right Assessment & Plan: Appears she has early cellulitis in her right upper extremity.  Cefadroxil  500 mg twice daily for a week.  If you are not substantially improved by Thursday please come in.   Assessment and Plan    Cellulitis of bilateral upper and lower extremities Suspected recurrence of cellulitis in bilateral upper and lower extremities with symptoms of intense pain, warmth, redness, swelling, and dimpling in the arms and legs. Differential diagnosis of reaction to recent injection ruled out by study nurse.  Acute upper respiratory infection, likely viral   Symptoms include sore throat, cough, and runny nose with clear discharge. Symptoms suggestive of a viral upper respiratory infection.  Type 2 diabetes mellitus without complications Reports decreased appetite but maintains food intake to manage blood glucose levels.  Ptosis Potential eyelid surgery for ptosis discussed, currently postponed due to Plavix use. Plans to proceed with surgery once Plavix is discontinued.         Return in about 2 days (around 05/20/2024).    Janavia Rottman K Hanna Aultman, MD

## 2024-05-18 NOTE — Assessment & Plan Note (Signed)
 Appears she has early cellulitis in her right upper extremity.  Cefadroxil  500 mg twice daily for a week.  If you are not substantially improved by Thursday please come in.

## 2024-05-20 ENCOUNTER — Encounter: Payer: Self-pay | Admitting: Family Medicine

## 2024-05-20 ENCOUNTER — Telehealth: Payer: Self-pay

## 2024-05-20 ENCOUNTER — Ambulatory Visit (INDEPENDENT_AMBULATORY_CARE_PROVIDER_SITE_OTHER): Admitting: Family Medicine

## 2024-05-20 VITALS — BP 150/83 | HR 68 | Temp 97.7°F | Resp 18 | Ht 64.0 in | Wt 174.0 lb

## 2024-05-20 DIAGNOSIS — L03113 Cellulitis of right upper limb: Secondary | ICD-10-CM | POA: Diagnosis not present

## 2024-05-20 DIAGNOSIS — I890 Lymphedema, not elsewhere classified: Secondary | ICD-10-CM | POA: Diagnosis not present

## 2024-05-20 DIAGNOSIS — C8590 Non-Hodgkin lymphoma, unspecified, unspecified site: Secondary | ICD-10-CM | POA: Insufficient documentation

## 2024-05-20 MED ORDER — SKINEEZ COMPRESSION ARM SLEEVE MISC: 1.0000 | Freq: Every day | 0 refills | Status: DC

## 2024-05-20 MED ORDER — SKINEEZ COMPRESSION ARM SLEEVE MISC: 1.0000 | Freq: Every day | 0 refills | Status: AC

## 2024-05-20 NOTE — Assessment & Plan Note (Signed)
 Improving.  Please FOLLOW-UP last day of ABX or day after finishing ABX.  If you develop a fever, chills or night sweats or feel terrible and redness, swelling or pain has worsened, go to ER or be seen here immediately.

## 2024-05-20 NOTE — Telephone Encounter (Signed)
 RX for compression arm sleeve faxed to [facility] as well as [facility].

## 2024-05-20 NOTE — Assessment & Plan Note (Signed)
 Right UE secondary to total mastectomy for breast cancer.  Needs a arm sleeve.  Will order through [facility].

## 2024-05-20 NOTE — Progress Notes (Signed)
 Established Patient Office Visit  Subjective   Patient ID: Abraham Margulies, female    DOB: [birth year]  Age: 68 y.o. MRN: [id number]  Chief Complaint  Patient presents with   Medical Management of Chronic Issues    HPI [name] 68 year old with Sjogren's syndrome/sicca syndrome (Dr. Olam Heron Bustle), PAF (Dr. Kayla Chance), amaurosis fugax, PUD, DMT2, HTN, mixed hyperlipidemia, CAD (MI 2023 detected on cardiac nuclear scan), DDD C3-C7 with cord compression, melanoma, anxiety, breast cancer (bilateral mastectomies and lymph node removal. Dr. Truman Bullock), primary biliary cholangitis, interstitial lung disease (Dr. Dayla). Colonoscopy 04/22/2017 no polyps (Cologuard 1 year ago negative) and recent cellulitis of lymphedema right arm.  Discussed the use of AI scribe software for clinical note transcription with the patient, who gave verbal consent to proceed.  She has swelling in her right arm, which is a new development, and she has not sought treatment for lymphedema prior to this visit. She does not use a compression sleeve as she does not have one.    She is currently on antibiotics for the cellulitis in her lymphadema, Cefadroxil ,  which is improving. She notes red streaks on her arm that are getting better with the antibiotic treatment. No fever, but she experiences occasional chills at night. Congestion in her head is clearing up, with increased sneezing, coughing, and nasal discharge.  She has a history of coronary artery disease and has undergone cardiac catheterization and RCA stent placement. She experiences episodes of heart racing and has been diagnosed with paroxsymal atrial fibrillation, tachycardia, bradycardia, PVCs, and PACs. She is taking metoprolol  and Plavix, Eliquis 5mg  and ASA.   She recalls an 80% blockage in her right coronary artery, which was addressed with a PCI and DES.  Prior to the cardiac intervention, she felt 'tired' and experienced 'deep vascular  throbbing.' She has a history of myocardial infarction. Symptoms have improved since PCI, DES placement        Objective:     BP (!) 150/83   Pulse 68   Temp 97.7 F (36.5 C) (Oral)   Resp 18   Ht 5' 4 (1.626 m)   Wt 174 lb (78.9 kg)   SpO2 98%   BMI 29.87 kg/m    Physical Exam Vitals and nursing note reviewed.  Constitutional:      Appearance: Normal appearance.  HENT:     Head: Normocephalic and atraumatic.  Eyes:     Conjunctiva/sclera: Conjunctivae normal.  Cardiovascular:     Rate and Rhythm: Normal rate and regular rhythm.  Pulmonary:     Effort: Pulmonary effort is normal.     Breath sounds: Normal breath sounds.  Musculoskeletal:     Right lower leg: No edema.     Left lower leg: No edema.  Skin:    General: Skin is warm and dry.     Comments: Right lymphedema with slight erythema of right arm anteriorly.  Mildly warm to the touch but not painful.    Neurological:     Mental Status: She is alert and oriented to person, place, and time.  Psychiatric:        Mood and Affect: Mood normal.        Behavior: Behavior normal.        Thought Content: Thought content normal.        Judgment: Judgment normal.          No results found for any visits on 05/20/24.    The ASCVD Risk score (Arnett DK,  et al., 2019) failed to calculate for the following reasons:   Risk score cannot be calculated because patient has a medical history suggesting prior/existing ASCVD    Assessment & Plan:  Cellulitis of arm, right Assessment & Plan: Improving.  Please FOLLOW-UP last day of ABX or day after finishing ABX.  If you develop a fever, chills or night sweats or feel terrible and redness, swelling or pain has worsened, go to ER or be seen here immediately.     Lymphedema Assessment & Plan: Right UE secondary to total mastectomy for breast cancer.  Needs a arm sleeve.  Will order through [employer].    abdominal painAssessment and Plan    Cellulitis of right  upper extremity Improvement noted with decreased redness and no fever, though occasional nocturnal chills persist. Antibiotic treatment is effective, with resolution of congestion symptoms. - Continue current antibiotic regimen. - Advise emergency room visit if fever, chills, or worsening symptoms occur. - Schedule follow-up appointment the day she finishes antibiotics or the day after.  Lymphedema of right upper extremity Recent onset, likely secondary to infection, with significant swelling compared to the left arm. No prior treatment for lymphedema. - Send requisition to [facility] for a compression sleeve. - Instruct to wear the sleeve once the infection resolves, from morning until bedtime. - Educate on the importance of managing swelling to prevent infections.  Coronary artery disease with prior myocardial infarction and recent RCA stent placement Recent cardiac catheterization revealed 80% blockage in the RCA.  Current treatment includes metoprolol , aspirin , and Plavix.  On Eliquis for PAF.   - Continue current medications including metoprolol , aspirin , and Plavix. - Follow up with cardiologist to discuss symptoms and potential need for CABG.  Atrial fibrillation and other cardiac arrhythmias Includes PVCs and PACs with recent echocardiogram showing several incidents of arrhythmias. Current management includes metoprolol . - Continue metoprolol  and Eliquis - Monitor symptoms and report any changes to cardiologist.      Return in about 5 days (around 05/25/2024).    Lakai Moree K Roswell Ndiaye, MD

## 2024-05-22 ENCOUNTER — Other Ambulatory Visit: Payer: Self-pay | Admitting: Family Medicine

## 2024-05-22 DIAGNOSIS — K219 Gastro-esophageal reflux disease without esophagitis: Secondary | ICD-10-CM

## 2024-05-25 ENCOUNTER — Ambulatory Visit
Admission: RE | Admit: 2024-05-25 | Discharge: 2024-05-25 | Disposition: A | Discharge status: Home / Self Care | Source: Ambulatory Visit | Attending: Acute Care | Admitting: Acute Care

## 2024-05-25 DIAGNOSIS — Z87891 Personal history of nicotine dependence: Secondary | ICD-10-CM | POA: Insufficient documentation

## 2024-05-25 DIAGNOSIS — Z122 Encounter for screening for malignant neoplasm of respiratory organs: Secondary | ICD-10-CM | POA: Diagnosis not present

## 2024-05-26 ENCOUNTER — Ambulatory Visit (INDEPENDENT_AMBULATORY_CARE_PROVIDER_SITE_OTHER): Admitting: Family Medicine

## 2024-05-26 ENCOUNTER — Encounter: Payer: Self-pay | Admitting: Family Medicine

## 2024-05-26 VITALS — BP 115/72 | HR 66 | Temp 98.5°F | Ht 64.0 in | Wt 178.2 lb

## 2024-05-26 DIAGNOSIS — L03113 Cellulitis of right upper limb: Secondary | ICD-10-CM

## 2024-05-26 DIAGNOSIS — Z9189 Other specified personal risk factors, not elsewhere classified: Secondary | ICD-10-CM

## 2024-05-29 ENCOUNTER — Encounter: Payer: Self-pay | Admitting: Family Medicine

## 2024-05-31 DIAGNOSIS — Z9189 Other specified personal risk factors, not elsewhere classified: Secondary | ICD-10-CM | POA: Insufficient documentation

## 2024-05-31 NOTE — Assessment & Plan Note (Addendum)
 From lymphedema.  Resolved after 10 days of ABX.  Watch for erythema, warmth, tenderness.  Please get the sleeve from [facility] and wear during the day and take off at night.

## 2024-05-31 NOTE — Progress Notes (Signed)
 Established Patient Office Visit  Subjective   Patient ID: Kristina Mccoy, female    DOB: [birth year]  Age: 68 y.o. MRN: [id number]  Chief Complaint  Patient presents with   Follow-up    Compression sleeve from [facility], waiting on the return fax. Still have swelling and reddness    HPI [name] 68 year old with Sjogren's syndrome/sicca syndrome (Dr. Olam Heron Bustle), PAF (Dr. Kayla Chance), amaurosis fugax, PUD, DMT2, HTN, mixed hyperlipidemia, CAD (MI 2023 detected on cardiac nuclear scan, LHC 04/29/2024:PCI and DES mid RCA for 80% blockage, plavix added and Crestor  increased to 20mg  q day), DDD C3-C7 with cord compression, melanoma, anxiety, breast cancer (bilateral mastectomies and lymph node removal. Dr. Truman Bullock), primary biliary cholangitis, interstitial lung disease (Dr. Dayla). Colonoscopy 04/22/2017 no polyps (Cologuard 1 year ago negative) and recent cellulitis of lymphedema right arm. SABRA   History of Present Illness   Kristina Mccoy is a 68 year old female with breast cancer and coronary artery disease who presents with arm swelling and cardiology concerns.  She experiences swelling in her arm, particularly in the mornings, accompanied by occasional pain. She has been in contact with [facility] regarding a compression sleeve to manage the swelling, but there have been delays in receiving the necessary paperwork. She has completed her ABX.  No difficulty with ABX.    The erythema of her right arm has improved but it is still present.  She still has some edema in her arm greater than her forearm, especially.  Her arm is not warm any longer and it is not tender.    She has a history of atrial fibrillation and coronary artery disease, with previous angiograms revealing blockages. She is currently on blood thinners due to her atrial fibrillation (Eliquis)  and recent PCI and DES (Plavix) and has experienced a myocardial infarction in 2022. She experiences intermittent  shortness of breath and chest discomfort, which she attributes to her known cardiac issues. She expresses confusion about her cardiologist's current management plan, which includes wearing a heart monitor or using an Apple Watch, as she feels these do not address the need for bypass surgery. She has reached out to her cardiologist and another interventional cardiologist for further clarification and management of her condition.  She denies any CP since her PCI and DES of the RCA 03/2024.  She has a family history of heart disease, with multiple family members having experienced cardiac events.         ROS    Objective:     BP 115/72   Pulse 66   Temp 98.5 F (36.9 C) (Oral)   Ht 5' 4 (1.626 m)   Wt 178 lb 4 oz (80.9 kg)   SpO2 97%   BMI 30.60 kg/m    Physical Exam Vitals and nursing note reviewed.  Constitutional:      Appearance: Normal appearance.  HENT:     Head: Normocephalic and atraumatic.  Eyes:     Conjunctiva/sclera: Conjunctivae normal.  Cardiovascular:     Rate and Rhythm: Normal rate and regular rhythm.  Pulmonary:     Effort: Pulmonary effort is normal.     Breath sounds: Normal breath sounds.  Musculoskeletal:     Right lower leg: No edema.     Left lower leg: No edema.  Skin:    General: Skin is warm and dry.     Findings: Erythema (erythema is present but lighter.  No warmth, non tender to exam) present.  Neurological:  Mental Status: She is alert and oriented to person, place, and time.  Psychiatric:        Mood and Affect: Mood normal.        Behavior: Behavior normal.        Thought Content: Thought content normal.        Judgment: Judgment normal.          No results found for any visits on 05/26/24.    The ASCVD Risk score (Arnett DK, et al., 2019) failed to calculate for the following reasons:   Risk score cannot be calculated because patient has a medical history suggesting prior/existing ASCVD    Assessment & Plan:   Cellulitis of arm, right Assessment & Plan: From lymphedema.  Resolved after 10 days of ABX.  Watch for erythema, warmth, tenderness.  [facility] and wear during the day and take off at night.     At risk for injury associated with anticoagulation Assessment & Plan: On Eliquis 5mg  BID (for AFIB) and Plavix (ofr PCI and DES of RCA)      Return in about 3 months (around 08/26/2024).    Lillyn Wieczorek K Nare Gaspari, MD

## 2024-05-31 NOTE — Assessment & Plan Note (Signed)
 On Eliquis 5mg  BID (for AFIB) and Plavix (ofr PCI and DES of RCA)

## 2024-06-02 ENCOUNTER — Encounter: Payer: Self-pay | Admitting: Internal Medicine

## 2024-06-02 DIAGNOSIS — I890 Lymphedema, not elsewhere classified: Secondary | ICD-10-CM | POA: Diagnosis not present

## 2024-06-02 DIAGNOSIS — J441 Chronic obstructive pulmonary disease with (acute) exacerbation: Secondary | ICD-10-CM

## 2024-06-08 ENCOUNTER — Other Ambulatory Visit: Payer: Self-pay

## 2024-06-08 DIAGNOSIS — I480 Paroxysmal atrial fibrillation: Secondary | ICD-10-CM | POA: Diagnosis not present

## 2024-06-08 DIAGNOSIS — Z87891 Personal history of nicotine dependence: Secondary | ICD-10-CM

## 2024-06-08 DIAGNOSIS — E785 Hyperlipidemia, unspecified: Secondary | ICD-10-CM | POA: Diagnosis not present

## 2024-06-08 DIAGNOSIS — I2510 Atherosclerotic heart disease of native coronary artery without angina pectoris: Secondary | ICD-10-CM | POA: Diagnosis not present

## 2024-06-08 DIAGNOSIS — I493 Ventricular premature depolarization: Secondary | ICD-10-CM | POA: Diagnosis not present

## 2024-06-08 DIAGNOSIS — R0789 Other chest pain: Secondary | ICD-10-CM | POA: Diagnosis not present

## 2024-06-08 DIAGNOSIS — Z122 Encounter for screening for malignant neoplasm of respiratory organs: Secondary | ICD-10-CM

## 2024-06-08 DIAGNOSIS — I129 Hypertensive chronic kidney disease with stage 1 through stage 4 chronic kidney disease, or unspecified chronic kidney disease: Secondary | ICD-10-CM | POA: Diagnosis not present

## 2024-06-08 DIAGNOSIS — E1169 Type 2 diabetes mellitus with other specified complication: Secondary | ICD-10-CM | POA: Diagnosis not present

## 2024-06-09 ENCOUNTER — Encounter: Payer: Self-pay | Admitting: Family Medicine

## 2024-06-10 ENCOUNTER — Encounter: Payer: Self-pay | Admitting: Family Medicine

## 2024-06-11 ENCOUNTER — Ambulatory Visit: Payer: Self-pay | Admitting: Internal Medicine

## 2024-06-11 DIAGNOSIS — I890 Lymphedema, not elsewhere classified: Secondary | ICD-10-CM | POA: Diagnosis not present

## 2024-06-11 NOTE — Telephone Encounter (Signed)
 FYI Only or Action Required?: FYI only for provider.  Patient is followed in Pulmonology for asthma/nodule, last seen on 10/30/2023 by Kristina Scrivener, MD.  Called Nurse Triage reporting Shortness of Breath, Cough, and Follow-up.  Symptoms began a week ago.  Interventions attempted: Maintenance inhaler.  Symptoms are: stable.  Triage Disposition: Home Care  Patient/caregiver understands and will follow disposition?: Yes          Copied from CRM [id number]. Topic: Clinical - Red Word Triage >> Jun 11, 2024 10:03 AM Kristina Mccoy wrote: [facility] that prompted transfer to Nurse Triage:  Shortness of breath  ----------------------------------------------------------------------- From previous Reason for Contact - Scheduling: Patient/patient representative is calling to schedule an appointment. Refer to attachments for appointment information. Reason for Disposition  Cough with cold symptoms (e.g., runny nose, postnasal drip, throat clearing)  Answer Assessment - Initial Assessment Questions E2C2 Pulmonary Triage - Initial Assessment Questions Chief Complaint (e.g., cough, sob, wheezing, fever, chills, sweat or additional symptoms) *Go to specific symptom protocol after initial questions. SOB, cough with clear mucus Endorses multiple blockages... recent hospitalization with stents  How long have symptoms been present? 2 weeks  Have you tested for COVID or Flu? Note: If not, ask patient if a home test can be taken. If so, instruct patient to call back for positive results. No  MEDICINES:   Have you used any OTC meds to help with symptoms? No If yes, ask What medications? N/a  Have you used your inhalers/maintenance medication? Yes If yes, What medications? fluticasone -salmeterol (WIXELA INHUB) - 1 puff twice daily Albuterol  INH - has not used recently [facility] reviewed/reinforced albuterol  usage and SIG.    If inhaler, ask How many puffs and how often? Note:  Review instructions on medication in the chart. See above  OXYGEN: Do you wear supplemental oxygen? No If yes, How many liters are you supposed to use? N/a  Do you monitor your oxygen levels? No If yes, What is your reading (oxygen level) today? N/a, does not have spo2 monitor  What is your usual oxygen saturation reading?  (Note: Pulmonary O2 sats should be 90% or greater) N/a    1. ONSET: When did the cough begin?      See above 2. SEVERITY: How bad is the cough today?      See above 3. SPUTUM: Describe the color of your sputum (e.g., none, dry cough; clear, white, yellow, green)     See above 4. HEMOPTYSIS: Are you coughing up any blood? If Yes, ask: How much? (e.g., flecks, streaks, tablespoons, etc.)     denies 5. DIFFICULTY BREATHING: Are you having difficulty breathing? If Yes, ask: How bad is it? (e.g., mild, moderate, severe)      Mild with exertion Triager does not appreciate audible SOB/wheezing during call. Pt is speaking in full sentences.  6. FEVER: Do you have a fever? If Yes, ask: What is your temperature, how was it measured, and when did it start?     denies 7. CARDIAC HISTORY: Do you have any history of heart disease? (e.g., heart attack, congestive heart failure)      Endorses Afib 8. LUNG HISTORY: Do you have any history of lung disease?  (e.g., pulmonary embolus, asthma, emphysema)     Asthma, OSA 9. PE RISK FACTORS: Do you have a history of blood clots? (or: recent major surgery, recent prolonged travel, bedridden)     Unknown  Endorses a blood clotting issue Currently on elaquis 10. OTHER SYMPTOMS: Do you have  any other symptoms? (e.g., runny nose, wheezing, chest pain)       Wheezing x 1-2 events. Runny nose 11. PREGNANCY: Is there any chance you are pregnant? When was your last menstrual period?       N/a 12. TRAVEL: Have you traveled out of the country in the last month? (e.g., travel history,  exposures)       N/a    Pt initially calling to schedule F/U appt with [facility], but was experiencing cough and mild SOB. [facility] offered to schedule  AV, but pt declined and wishes to continue Home Care. Patient verbalized understanding and to call back with worsening symptoms.  Protocols used: Cough - Acute Productive-A-AH

## 2024-06-11 NOTE — Telephone Encounter (Signed)
 Noted. Nothing further needed.

## 2024-06-11 NOTE — Telephone Encounter (Signed)
 [employer] images request has been sent.

## 2024-06-14 ENCOUNTER — Other Ambulatory Visit: Payer: Self-pay

## 2024-06-14 ENCOUNTER — Other Ambulatory Visit: Payer: Self-pay | Admitting: Internal Medicine

## 2024-06-14 DIAGNOSIS — R918 Other nonspecific abnormal finding of lung field: Secondary | ICD-10-CM

## 2024-06-14 NOTE — Addendum Note (Signed)
 Addended by: Amel Kitch J on: 06/14/2024 08:24 AM   Modules accepted: Orders

## 2024-06-14 NOTE — Progress Notes (Signed)
 LDCT 04/2024 reviewed, recommend repeat CT chest 2 months

## 2024-06-14 NOTE — Telephone Encounter (Signed)
 Noted, nfn

## 2024-06-18 ENCOUNTER — Ambulatory Visit: Admitting: Family Medicine

## 2024-06-23 ENCOUNTER — Ambulatory Visit: Admitting: Internal Medicine

## 2024-06-23 ENCOUNTER — Encounter: Payer: Self-pay | Admitting: Internal Medicine

## 2024-06-23 VITALS — BP 110/80 | HR 64 | Temp 98.1°F | Ht 65.0 in | Wt 180.4 lb

## 2024-06-23 DIAGNOSIS — J449 Chronic obstructive pulmonary disease, unspecified: Secondary | ICD-10-CM | POA: Diagnosis not present

## 2024-06-23 DIAGNOSIS — F4312 Post-traumatic stress disorder, chronic: Secondary | ICD-10-CM | POA: Diagnosis not present

## 2024-06-23 DIAGNOSIS — G4733 Obstructive sleep apnea (adult) (pediatric): Secondary | ICD-10-CM | POA: Diagnosis not present

## 2024-06-23 DIAGNOSIS — Z87891 Personal history of nicotine dependence: Secondary | ICD-10-CM | POA: Diagnosis not present

## 2024-06-23 DIAGNOSIS — Z122 Encounter for screening for malignant neoplasm of respiratory organs: Secondary | ICD-10-CM

## 2024-06-23 DIAGNOSIS — F411 Generalized anxiety disorder: Secondary | ICD-10-CM | POA: Diagnosis not present

## 2024-06-23 NOTE — Progress Notes (Signed)
 [facility]   PFTs and mild restriction, RV 74, DLCO 61.  Sleep study February 2016 AHI 14.3, AutoPap 5-15.    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 Additional subpleural radiation scarring in the anterior right lung. Minimal centrilobular emphysema. 4.4 mm left upper lobe nodule. No suspicious pulmonary nodules. No pleural fluid. Airway is unremarkable.   Date: 06/23/2024  MRN# [id number] Kristina Mccoy 06-03-1956  CC Follow-up assessment for OSA Follow-up assessment for asthma Assessment for abnormal CT chest  HPI:   Discussed sleep data and reviewed with patient.  Encouraged proper weight management.  Discussed driving precautions and its relationship with hypersomnolence.  Discussed sleep hygiene, and benefits of a fixed sleep waked time.  The importance of getting eight or more hours of sleep discussed with patient.  Discussed limiting the use of the computer and television before bedtime.  Decrease naps during the day, so night time sleep will become enhanced.  Limit caffeine, and sleep deprivation.   Patient uses and benefits from therapy Using CPAP nightly and with naps Pressure setting is comfortable and is sleeping well.  Excellent compliance report 100% compliance for days and greater than 4 hours AHI significantly reduced 0.8 CPAP 8 Patient use and benefits from therapy     patient has a history of Sjogren's syndrome and is being evaluated by [facility]  rheumatology Patient with A-fib going to [facility] for assessment Atrial fibrillation is not related to her OSA since her OSA is well-controlled Patient with coronary artery disease being assessed by CT surgery in the next 24 hours  COPD stable No exacerbation at this time No evidence of heart failure at this time No  evidence or signs of infection at this time No respiratory distress No fevers, chills, nausea, vomiting, diarrhea No evidence of lower extremity edema No evidence hemoptysis Currently on Wixela  CT of the chest reviewed with patient in detail from 07/2020 and July 2024, AUG 2025 Patient in the lung cancer screening referral program 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 Patient has history of breast cancer status post XRT No significant changes over the last 4 years  apartment with carpeting Use albuterol  as needed Wixela as maintenance therapy  July 2025 hospital admission for right arm cellulitis required ICU admission likely due to IV insertion from angiogram    h/o RT breast cancer bilateral mastectomy Surgical Hx:  Past Surgical History:  Procedure Laterality Date   BREAST SURGERY Bilateral    mastectomy   COLONOSCOPY WITH PROPOFOL  N/A 04/22/2017   Procedure: COLONOSCOPY WITH PROPOFOL ;  Surgeon: Gaylyn Gladis PENNER, MD;  Location[facility] ENDOSCOPY;  Service: Endoscopy;  Laterality: N/A;   ESOPHAGOGASTRODUODENOSCOPY (EGD) WITH PROPOFOL  N/A 04/22/2017   Procedure: ESOPHAGOGASTRODUODENOSCOPY (EGD) WITH PROPOFOL ;  Surgeon: Gaylyn Gladis PENNER, MD;  Location: [facility] ENDOSCOPY;  Service: Endoscopy;  Laterality: N/A;   MASTECTOMY Bilateral 03/23/13   Family Hx:  Family History  Problem Relation Age of Onset   Diabetes Mother    Heart disease Mother    Hyperlipidemia Mother    Heart failure Mother    Diabetes Other    Pancreatic cancer Father    Heart disease Brother        Aortic valve disease   Hyperlipidemia Brother    Colon polyps Brother    Stroke  Maternal Grandmother    Breast cancer Maternal Aunt    Colon cancer Paternal Grandmother    Rheum arthritis Sister    Psoriasis Other        psoriatic arthritis    Heart disease Brother 51       Tachycardia   Heart disease Brother    Ovarian cancer Neg Hx    Social Hx:   Social  History   Tobacco Use   Smoking status: Former    Current packs/day: 0.00    Average packs/day: 1 pack/day for 30.0 years (30.0 ttl pk-yrs)    Types: Cigarettes    Start date: 03/01/1983    Quit date: 02/28/2013    Years since quitting: 11.3    Passive exposure: Past   Smokeless tobacco: Never  Vaping Use   Vaping status: Never Used  Substance Use Topics   Alcohol use: No    Alcohol/week: 0.0 standard drinks of alcohol   Drug use: No   Medication:    Current Outpatient Medications:    Continuous Glucose Receiver (FREESTYLE LIBRE 3 READER) DEVI, SMARTSIG:1 Unit(s) Once, Disp: , Rfl:    metoprolol  succinate (TOPROL -XL) 50 MG 24 hr tablet, Take 50 mg by mouth daily., Disp: , Rfl:    rosuvastatin  (CRESTOR ) 20 MG tablet, Take 20 mg by mouth daily., Disp: , Rfl:    albuterol  (PROVENTIL ) (2.5 MG/3ML) 0.083% nebulizer solution, Take 3 mLs (2.5 mg total) by nebulization every 4 (four) hours as needed for wheezing or shortness of breath., Disp: 75 mL, Rfl: 5   Albuterol -Budesonide (AIRSUPRA ) 90-80 MCG/ACT AERO, Inhale 2 puffs into the lungs every 4 (four) hours as needed (wheezing or shortness of breath)., Disp: 10.7 g, Rfl: 1   ALPRAZolam  (XANAX ) 0.5 MG tablet, TAKE 1 TABLET BY MOUTH 4 TIMES A DAY AS NEEDED FOR ANXIETY, Disp: 120 tablet, Rfl: 2   Azelastine  HCl 137 MCG/SPRAY SOLN, PLACE 2 SPRAYS INTO BOTH NOSTRILS 2 (TWO) TIMES DAILY. USE IN EACH NOSTRIL AS DIRECTED, Disp: 90 mL, Rfl: 2   baclofen  (LIORESAL ) 10 MG tablet, Take 1 tablet (10 mg total) by mouth 3 (three) times daily as needed for muscle spasms., Disp: 90 tablet, Rfl: 1   candesartan (ATACAND) 8 MG tablet, Take 4 mg by mouth daily., Disp: , Rfl:    cefadroxil  (DURICEF) 500 MG capsule, Take 1 capsule (500 mg total) by mouth 2 (two) times daily., Disp: 14 capsule, Rfl: 0   clopidogrel (PLAVIX) 75 MG tablet, Take 75 mg by mouth daily., Disp: , Rfl:    Continuous Blood Gluc Sensor (FREESTYLE LIBRE 14 DAY SENSOR) MISC, SMARTSIG:1 Each  Topical Every 2 Weeks, Disp: , Rfl:    dapagliflozin propanediol (FARXIGA) 5 MG TABS tablet, Take 5 mg by mouth daily., Disp: , Rfl:    diclofenac Sodium (VOLTAREN) 1 % GEL, Apply 1 Application topically 4 (four) times daily., Disp: , Rfl:    Elastic Bandages & Supports (SKINEEZ COMPRESSION ARM SLEEVE) MISC, 1 each by Does not apply route daily., Disp: 1 each, Rfl: 0   ELIQUIS 5 MG TABS tablet, Take 5 mg by mouth 2 (two) times daily., Disp: , Rfl:    Emollient (CERAVE) CREA, Apply topically daily. , Disp: , Rfl:    ezetimibe (ZETIA) 10 MG tablet, Take 10 mg by mouth daily., Disp: , Rfl:    fluticasone -salmeterol (WIXELA INHUB) 500-50 MCG/ACT AEPB, INHALE 1 PUFF INTO THE LUNGS IN THE MORNING AND AT BEDTIME. PLEASE SCHEDULE OFFICE VISIT BEFORE ANY FUTURE REFILLS., Disp: 60  each, Rfl: 11   Glucagon 3 MG/DOSE POWD, , Disp: , Rfl:    HYDROcodone -acetaminophen  (NORCO/VICODIN) 5-325 MG tablet, Take 1 tablet by mouth every 6 (six) hours as needed for moderate pain (pain score 4-6)., Disp: 15 tablet, Rfl: 0   hydrocortisone 2.5 % cream, APPLY TO AFFECTED AREA TWICE A DAY, Disp: , Rfl:    insulin  aspart (NOVOLOG ) 100 UNIT/ML FlexPen, Inject 12 Units into the skin 3 (three) times daily with meals., Disp: 15 mL, Rfl: 3   Insulin  Pen Needle 32G X 6 MM MISC, Use 3 times daily with meals., Disp: 1 each, Rfl: 3   ketoconazole (NIZORAL) 2 % cream, Apply 1 application topically 2 (two) times daily as needed., Disp: , Rfl: 3   ketoconazole (NIZORAL) 2 % shampoo, Apply 1 application  topically as needed., Disp: , Rfl: 11   Lancets (ONETOUCH DELICA PLUS LANCET30G) MISC, 4 (four) times daily., Disp: , Rfl:    nitroGLYCERIN  (NITROSTAT ) 0.4 MG SL tablet, Place 1 tablet (0.4 mg total) under the tongue every 5 (five) minutes as needed for chest pain., Disp: 50 tablet, Rfl: 3   ondansetron  (ZOFRAN ) 4 MG tablet, TAKE 1 TABLET BY MOUTH EVERY 8 HOURS AS NEEDED FOR NAUSEA AND VOMITING, Disp: 18 tablet, Rfl: 1   ONETOUCH ULTRA  test strip, 4 (four) times daily. for testing, Disp: , Rfl:    pantoprazole  (PROTONIX ) 40 MG tablet, TAKE 1 TABLET BY MOUTH EVERY DAY, Disp: 90 tablet, Rfl: 1   pseudoephedrine-guaifenesin  (MUCINEX  D) 60-600 MG 12 hr tablet, Take 1 tablet by mouth 2 (two) times daily as needed. , Disp: , Rfl:    TRESIBA FLEXTOUCH 200 UNIT/ML FlexTouch Pen, Inject 12 Units into the skin daily., Disp: , Rfl:    triamcinolone  (NASACORT ) 55 MCG/ACT AERO nasal inhaler, Place 2 sprays into the nose daily., Disp: , Rfl:    ursodiol (ACTIGALL) 500 MG tablet, Take 1,000 mg by mouth daily., Disp: , Rfl:    Allergies:  Gabapentin , Sodium fluoride, Wound dressing adhesive, Oxycodone , Repatha  [evolocumab ], Carbamazepine, Doxycycline , Dulaglutide, Ezetimibe, Insulin  glargine, Levaquin  [levofloxacin ], Metformin, Methylprednisolone, Mirtazapine , Morphine, Pregabalin , Rosuvastatin , Venlafaxine, Buprenorphine, Canagliflozin , Escitalopram , and Hydroxychloroquine   BP 110/80   Pulse 64   Temp 98.1 F (36.7 C)   Ht 5' 5 (1.651 m)   Wt 180 lb 6.4 oz (81.8 kg)   SpO2 98%   BMI 30.02 kg/m    Physical Examination:   General Appearance: No distress  EYES PERRLA, EOM intact.   NECK Supple, No JVD Pulmonary: normal breath sounds, No wheezing.  CardiovascularNormal S1,S2.  No m/r/g.   Abdomen: Benign, Soft, non-tender. Neurology UE/LE 5/5 strength, no focal deficits Ext pulses intact, cap refill intact ALL OTHER ROS ARE NEGATIVE      Assessment and Plan:  68 year old pleasant white female follow-up today for assessment for shortness of breath and dyspnea on exertion likely related to restrictive lung disease and asthma body habitus with a history of Sjogren's disease with a history of radiation fibrosis with underlying sleep apnea with underlying diagnosis of CAD  ASTHMA/COPD Well-controlled Continue Wixela as prescribed Rinse mouth after every use Avoid Allergens and Irritants Avoid secondhand smoke Avoid SICK  contacts Recommend  Masking  when appropriate Recommend Keep up-to-date with vaccinations No exacerbation at this time No evidence of heart failure at this time No evidence or signs of infection at this time No respiratory distress No fevers, chills, nausea, vomiting, diarrhea No evidence of lower extremity edema No evidence hemoptysis  Assessment for  abnormal CT chest CT of the chest abnormal radiological finding right upper lobe fibrosis Follow-up CT scans yearly-follow-up low-dose CT program There is no progression of disease over the last several years CT scans revealed in detail with the patient    Assessment of OSA Previous AHI 14 Continue CPAP as prescribed  Excellent compliance report Reviewed compliance report in detail with patient Patient definitely benefits the use of CPAP therapy as prescribed Using CPAP nightly and with naps Pressure setting is comfortable and is sleeping well. CPAP prescription 5-15 AHI reduced to 0.8  No evidence of acute heart failure at this time No respiratory distress No fevers, chills, nausea, vomiting, diarrhea No evidence hemoptysis  Patient Instructions Continue to use CPAP every night, minimum of 4-6 hours a night.  Change equipment every 30 days or as directed by DME.  Wash your tubing with warm soap and water daily, hang to dry. Wash humidifier portion weekly. Use bottled, distilled water and change daily   Be aware of reduced alertness and do not drive or operate heavy machinery if experiencing this or drowsiness.  Exercise encouraged, as tolerated. Encouraged proper weight management.  Important to get eight or more hours of sleep  Limiting the use of the computer and television before bedtime.  Decrease naps during the day, so night time sleep will become enhanced.  Limit caffeine, and sleep deprivation.  HTN, stroke, uncontrolled diabetes and heart failure are potential risk factors.  Risk of untreated sleep apnea  including cardiac arrhthymias, stroke, DM, pulm HTN.    Sjogren's syndrome follow-up with [facility] rheumatology   Cardiac disease and CHF Follow-up with cardiology as scheduled Patient with coronary artery disease CT surgery pending  Former smoker Follow-up low-dose CT chest annually  MEDICATION ADJUSTMENTS/LABS AND TESTS ORDERED: Continue inhalers as prescribed  Follow-up low-dose CT chest program Continue CPAP Avoid Allergens and Irritants Avoid secondhand smoke Avoid SICK contacts Recommend  Masking  when appropriate Recommend Keep up-to-date with vaccinations Follow-up CT surgery  CURRENT MEDICATIONS REVIEWED AT LENGTH WITH PATIENT TODAY   Patient  satisfied with Plan of action and management. All questions answered   Follow up 1 year   I spent a total of 42 minutes dedicated to the care of this patient on the date of this encounter to include pre-visit review of records, face-to-face time with the patient discussing conditions above, post visit ordering of testing, clinical documentation with the electronic health record, making appropriate referrals as documented, and communicating necessary information to the patient's healthcare team.    The Patient requires high complexity decision making for assessment and support, frequent evaluation and titration of therapies, application of advanced monitoring technologies and extensive interpretation of multiple databases.  Patient satisfied with Plan of action and management. All questions answered    Nickolas Alm Cellar, M.D.  [facility]  Medical Director [facility] Medical Director [facility] Cardio-Pulmonary Department

## 2024-06-23 NOTE — Patient Instructions (Addendum)
 Excellent Job A+ GOLD STAR!!  Continue CPAP as prescribed  Patient Instructions Continue to use CPAP every night, minimum of 4-6 hours a night.  Change equipment every 30 days or as directed by DME.  Wash your tubing with warm soap and water daily, hang to dry. Wash humidifier portion weekly. Use bottled, distilled water and change daily   Be aware of reduced alertness and do not drive or operate heavy machinery if experiencing this or drowsiness.  Exercise encouraged, as tolerated. Encouraged proper weight management.  Important to get eight or more hours of sleep  Limiting the use of the computer and television before bedtime.  Decrease naps during the day, so night time sleep will become enhanced.  Limit caffeine, and sleep deprivation.    Avoid Allergens and Irritants Avoid secondhand smoke Avoid SICK contacts Recommend  Masking  when appropriate Recommend Keep up-to-date with vaccinations   Continue inhalers as prescribed Rinse mouth after use  Follow-up with cardiothoracic surgery  Follow-up low-dose CT chest lung cancer screening program in 1 year

## 2024-06-24 DIAGNOSIS — Z955 Presence of coronary angioplasty implant and graft: Secondary | ICD-10-CM | POA: Diagnosis not present

## 2024-06-24 DIAGNOSIS — I2510 Atherosclerotic heart disease of native coronary artery without angina pectoris: Secondary | ICD-10-CM | POA: Diagnosis not present

## 2024-06-24 DIAGNOSIS — R0600 Dyspnea, unspecified: Secondary | ICD-10-CM | POA: Diagnosis not present

## 2024-06-24 DIAGNOSIS — Z87891 Personal history of nicotine dependence: Secondary | ICD-10-CM | POA: Diagnosis not present

## 2024-06-25 DIAGNOSIS — F411 Generalized anxiety disorder: Secondary | ICD-10-CM | POA: Diagnosis not present

## 2024-06-25 DIAGNOSIS — F4312 Post-traumatic stress disorder, chronic: Secondary | ICD-10-CM | POA: Diagnosis not present

## 2024-06-29 DIAGNOSIS — F4312 Post-traumatic stress disorder, chronic: Secondary | ICD-10-CM | POA: Diagnosis not present

## 2024-06-29 DIAGNOSIS — F411 Generalized anxiety disorder: Secondary | ICD-10-CM | POA: Diagnosis not present

## 2024-06-29 DIAGNOSIS — F331 Major depressive disorder, recurrent, moderate: Secondary | ICD-10-CM | POA: Diagnosis not present

## 2024-07-02 DIAGNOSIS — F4312 Post-traumatic stress disorder, chronic: Secondary | ICD-10-CM | POA: Diagnosis not present

## 2024-07-02 DIAGNOSIS — F411 Generalized anxiety disorder: Secondary | ICD-10-CM | POA: Diagnosis not present

## 2024-07-06 ENCOUNTER — Telehealth: Payer: Self-pay | Admitting: Internal Medicine

## 2024-07-06 DIAGNOSIS — F4312 Post-traumatic stress disorder, chronic: Secondary | ICD-10-CM | POA: Diagnosis not present

## 2024-07-06 DIAGNOSIS — F411 Generalized anxiety disorder: Secondary | ICD-10-CM | POA: Diagnosis not present

## 2024-07-06 NOTE — Telephone Encounter (Signed)
 Dr. Isaiah when did you want the Chest CT to be done per your notes Note: Isaiah Scrivener, MD to Lang Exie PARAS, CMA    06/14/24  1:00 PM CT chest and Follow up BOTH in 2 months Dr. Jacqulyn note from 06/23/24 states Follow-up low-dose CT chest lung cancer screening program in 1 year

## 2024-07-07 ENCOUNTER — Telehealth: Payer: Self-pay

## 2024-07-07 NOTE — Telephone Encounter (Signed)
 LVM requesting a return call. Need to inform patient that office has received 2 boxes of Tresiba and 3 boxes of Novolog  on her behalf. Mychart message will be sent as well.

## 2024-07-08 NOTE — Telephone Encounter (Signed)
 I have noted this on the Ct order to be scheduled 04/2025

## 2024-07-13 DIAGNOSIS — R110 Nausea: Secondary | ICD-10-CM | POA: Diagnosis not present

## 2024-07-13 DIAGNOSIS — K5903 Drug induced constipation: Secondary | ICD-10-CM | POA: Diagnosis not present

## 2024-07-13 DIAGNOSIS — K743 Primary biliary cirrhosis: Secondary | ICD-10-CM | POA: Diagnosis not present

## 2024-07-14 DIAGNOSIS — F411 Generalized anxiety disorder: Secondary | ICD-10-CM | POA: Diagnosis not present

## 2024-07-14 DIAGNOSIS — F4312 Post-traumatic stress disorder, chronic: Secondary | ICD-10-CM | POA: Diagnosis not present

## 2024-07-14 DIAGNOSIS — F331 Major depressive disorder, recurrent, moderate: Secondary | ICD-10-CM | POA: Diagnosis not present

## 2024-07-16 DIAGNOSIS — G453 Amaurosis fugax: Secondary | ICD-10-CM | POA: Diagnosis not present

## 2024-07-16 DIAGNOSIS — Z853 Personal history of malignant neoplasm of breast: Secondary | ICD-10-CM | POA: Diagnosis not present

## 2024-07-16 DIAGNOSIS — Z87891 Personal history of nicotine dependence: Secondary | ICD-10-CM | POA: Diagnosis not present

## 2024-07-16 DIAGNOSIS — Z8249 Family history of ischemic heart disease and other diseases of the circulatory system: Secondary | ICD-10-CM | POA: Diagnosis not present

## 2024-07-16 DIAGNOSIS — Z923 Personal history of irradiation: Secondary | ICD-10-CM | POA: Diagnosis not present

## 2024-07-16 DIAGNOSIS — I493 Ventricular premature depolarization: Secondary | ICD-10-CM | POA: Diagnosis not present

## 2024-07-16 DIAGNOSIS — Z955 Presence of coronary angioplasty implant and graft: Secondary | ICD-10-CM | POA: Diagnosis not present

## 2024-07-16 DIAGNOSIS — E785 Hyperlipidemia, unspecified: Secondary | ICD-10-CM | POA: Diagnosis not present

## 2024-07-16 DIAGNOSIS — Z9013 Acquired absence of bilateral breasts and nipples: Secondary | ICD-10-CM | POA: Diagnosis not present

## 2024-07-16 DIAGNOSIS — I480 Paroxysmal atrial fibrillation: Secondary | ICD-10-CM | POA: Diagnosis not present

## 2024-07-16 DIAGNOSIS — I252 Old myocardial infarction: Secondary | ICD-10-CM | POA: Diagnosis not present

## 2024-07-16 DIAGNOSIS — Z7901 Long term (current) use of anticoagulants: Secondary | ICD-10-CM | POA: Diagnosis not present

## 2024-07-16 DIAGNOSIS — M3500 Sicca syndrome, unspecified: Secondary | ICD-10-CM | POA: Diagnosis not present

## 2024-07-16 DIAGNOSIS — E119 Type 2 diabetes mellitus without complications: Secondary | ICD-10-CM | POA: Diagnosis not present

## 2024-07-16 DIAGNOSIS — I25118 Atherosclerotic heart disease of native coronary artery with other forms of angina pectoris: Secondary | ICD-10-CM | POA: Diagnosis not present

## 2024-07-16 DIAGNOSIS — I10 Essential (primary) hypertension: Secondary | ICD-10-CM | POA: Diagnosis not present

## 2024-07-28 ENCOUNTER — Encounter: Payer: Self-pay | Admitting: Family Medicine

## 2024-07-28 DIAGNOSIS — F4312 Post-traumatic stress disorder, chronic: Secondary | ICD-10-CM | POA: Diagnosis not present

## 2024-07-28 DIAGNOSIS — F411 Generalized anxiety disorder: Secondary | ICD-10-CM | POA: Diagnosis not present

## 2024-07-30 DIAGNOSIS — F4312 Post-traumatic stress disorder, chronic: Secondary | ICD-10-CM | POA: Diagnosis not present

## 2024-07-30 DIAGNOSIS — F411 Generalized anxiety disorder: Secondary | ICD-10-CM | POA: Diagnosis not present

## 2024-08-02 ENCOUNTER — Ambulatory Visit: Admitting: Family Medicine

## 2024-08-02 ENCOUNTER — Telehealth: Payer: Self-pay

## 2024-08-02 NOTE — Telephone Encounter (Signed)
 LVM informing pt about LIS/ExtraHelp for medication assistance

## 2024-08-05 DIAGNOSIS — F4312 Post-traumatic stress disorder, chronic: Secondary | ICD-10-CM | POA: Diagnosis not present

## 2024-08-05 DIAGNOSIS — F411 Generalized anxiety disorder: Secondary | ICD-10-CM | POA: Diagnosis not present

## 2024-08-08 ENCOUNTER — Other Ambulatory Visit: Payer: Self-pay | Admitting: Family Medicine

## 2024-08-08 DIAGNOSIS — I2510 Atherosclerotic heart disease of native coronary artery without angina pectoris: Secondary | ICD-10-CM

## 2024-08-09 ENCOUNTER — Ambulatory Visit (INDEPENDENT_AMBULATORY_CARE_PROVIDER_SITE_OTHER): Admitting: Family Medicine

## 2024-08-09 ENCOUNTER — Encounter (HOSPITAL_BASED_OUTPATIENT_CLINIC_OR_DEPARTMENT_OTHER): Payer: Self-pay

## 2024-08-09 ENCOUNTER — Encounter: Payer: Self-pay | Admitting: Family Medicine

## 2024-08-09 VITALS — BP 135/81 | HR 75 | Temp 97.6°F | Resp 18 | Ht 65.0 in | Wt 180.0 lb

## 2024-08-09 DIAGNOSIS — I10 Essential (primary) hypertension: Secondary | ICD-10-CM | POA: Diagnosis not present

## 2024-08-09 DIAGNOSIS — M545 Low back pain, unspecified: Secondary | ICD-10-CM

## 2024-08-09 DIAGNOSIS — G8929 Other chronic pain: Secondary | ICD-10-CM

## 2024-08-09 DIAGNOSIS — F32A Depression, unspecified: Secondary | ICD-10-CM | POA: Diagnosis not present

## 2024-08-09 DIAGNOSIS — G5692 Unspecified mononeuropathy of left upper limb: Secondary | ICD-10-CM | POA: Insufficient documentation

## 2024-08-09 DIAGNOSIS — F419 Anxiety disorder, unspecified: Secondary | ICD-10-CM | POA: Diagnosis not present

## 2024-08-09 DIAGNOSIS — I480 Paroxysmal atrial fibrillation: Secondary | ICD-10-CM | POA: Diagnosis not present

## 2024-08-09 DIAGNOSIS — M5412 Radiculopathy, cervical region: Secondary | ICD-10-CM

## 2024-08-09 MED ORDER — HYDROCODONE-ACETAMINOPHEN 5-325 MG PO TABS: 1.0000 | ORAL_TABLET | Freq: Three times a day (TID) | ORAL | 0 refills | Status: AC | PRN

## 2024-08-09 MED ORDER — ALPRAZOLAM 0.5 MG PO TABS: 0.5000 mg | ORAL_TABLET | Freq: Two times a day (BID) | ORAL | 3 refills | Status: DC | PRN

## 2024-08-09 NOTE — Assessment & Plan Note (Signed)
 She is on Eliquis which precludes her taking NSAIDs.  She takes hydrocodone  5 mg sparingly.  Refilled hydrocodone  5 mg prescription #15

## 2024-08-09 NOTE — Assessment & Plan Note (Signed)
 Takes Xanax  0.5 mg 4 times a day.  Refilled her Xanax  prescription

## 2024-08-09 NOTE — Assessment & Plan Note (Signed)
 She has weakness involving the median, ulnar and radial nerves.  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  Symptoms include throbbing, aching pain, pins and needle sensation and weakness.  Nerve conduction study left upper extremity

## 2024-08-09 NOTE — Assessment & Plan Note (Signed)
 Frequently checks blood pressure at home and it ranges in the 120s over 60s.  Is elevated here but it may be whitecoat hypertension

## 2024-08-09 NOTE — Assessment & Plan Note (Signed)
 She monitors her blood pressure and rhythm and notices irregular heartbeats.  She is on Eliquis for atrial fibrillation.

## 2024-08-09 NOTE — Progress Notes (Signed)
 Established Patient Office Visit  Subjective   Patient ID: Kristina Mccoy, female    DOB: August 28, 1956  Age: 68 y.o. MRN: [id number]  Chief Complaint  Patient presents with   Medical Management of Chronic Issues    HPI Discussed the use of AI scribe software for clinical note transcription with the patient, who gave verbal consent to proceed.  History of Present Illness   Kristina Mccoy is a 68 year old female with coronary artery disease who presents with left arm pain and weakness following a cardiac catheterization.  She reports significant pain and weakness in her left arm following a cardiac catheterization performed on July 16, 2024. During the procedure, she experienced pain and noted that her hand turned purple in recovery. Post-procedure, she experienced significant bleeding from the catheter site, which was managed by the nursing staff. Since then, she has had persistent pain radiating to her shoulder, described as throbbing and aching, and her hand feels like 'pins and needles'. She also reports weakness, stating that holding her cell phone in her left hand feels too heavy, and she often has to switch to her right hand.  She has a history of coronary artery disease and underwent a cardiac catheterization on October 17 with access through the radial artery.  She reports ongoing chest pain and pressure, especially upon exertion, leading to shortness of breath and fatigue. Her blood pressure readings have been variable, with some significant highs noted. She is currently on Eliquis and Plavix.  She has a history of atrial fibrillation and reports episodes of heart racing, fluttering, and irregular heartbeats, which she monitors with a home blood pressure machine. She also mentions experiencing bradycardia and tachycardia.  She has a history of neck issues, including bone spurs. Lifting her arm causes pain and she has difficulty reaching for objects.  She has not received the  flu shot, COVID vaccine, or RSV vaccine. She previously participated in a clinical trial involving an injection that resulted in a severe rash and illness, leading her to discontinue participation.  She experiences swelling under her eyes and reports a family history of heart issues, noting that her twin brother has similar symptoms.        Objective:     BP 135/81 (BP Location: Left Arm, Patient Position: Sitting, Cuff Size: Normal)   Pulse 75   Temp 97.6 F (36.4 C) (Oral)   Resp 18   Ht 5' 5 (1.651 m)   Wt 180 lb (81.6 kg)   SpO2 98%   BMI 29.95 kg/m    Physical Exam Vitals and nursing note reviewed.  Constitutional:      Appearance: Normal appearance.  HENT:     Head: Normocephalic and atraumatic.  Eyes:     Conjunctiva/sclera: Conjunctivae normal.  Cardiovascular:     Rate and Rhythm: Normal rate and regular rhythm.  Pulmonary:     Effort: Pulmonary effort is normal.     Breath sounds: Normal breath sounds.  Musculoskeletal:     Right lower leg: No edema.     Left lower leg: No edema.     Comments: Weakness of the left arm in supination and pronation, wrist flexion and extension, okay sign bilaterally  Skin:    General: Skin is warm and dry.  Neurological:     Mental Status: She is alert and oriented to person, place, and time.  Psychiatric:        Mood and Affect: Mood normal.  Behavior: Behavior normal.        Thought Content: Thought content normal.        Judgment: Judgment normal.          No results found for any visits on 08/09/24.    The ASCVD Risk score (Arnett DK, et al., 2019) failed to calculate for the following reasons:   Risk score cannot be calculated because patient has a medical history suggesting prior/existing ASCVD    Assessment & Plan:  Cervical radiculopathy -     HYDROcodone -Acetaminophen ; Take 1 tablet by mouth every 8 (eight) hours as needed for moderate pain (pain score 4-6).  Dispense: 15 tablet; Refill:  0  Anxiety and depression Assessment & Plan: Takes Xanax  0.5 mg 4 times a day.  Refilled her Xanax  prescription  Orders: -     ALPRAZolam ; Take 1 tablet (0.5 mg total) by mouth 2 (two) times daily as needed for anxiety.  Dispense: 120 tablet; Refill: 3  Neuropathy of left upper extremity Assessment & Plan: She has weakness involving the median, ulnar and radial nerves.  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  Symptoms include throbbing, aching pain, pins and needle sensation and weakness.  Nerve conduction study left upper extremity   Paroxysmal atrial fibrillation (HCC) Assessment & Plan: She monitors her blood pressure and rhythm and notices irregular heartbeats.  She is on Eliquis for atrial fibrillation.   Hypertension, unspecified type Assessment & Plan: Frequently checks blood pressure at home and it ranges in the 120s over 60s.  Is elevated here but it may be whitecoat hypertension   Chronic right-sided low back pain, unspecified whether sciatica present Assessment & Plan: She is on Eliquis which precludes her taking NSAIDs.  She takes hydrocodone  5 mg sparingly.  Refilled hydrocodone  5 mg prescription #15              Return in about 3 months (around 11/09/2024).    Tasman Zapata K Eydie Wormley, MD

## 2024-08-12 ENCOUNTER — Ambulatory Visit: Admitting: Podiatry

## 2024-08-13 DIAGNOSIS — R220 Localized swelling, mass and lump, head: Secondary | ICD-10-CM | POA: Diagnosis not present

## 2024-08-13 DIAGNOSIS — M3501 Sicca syndrome with keratoconjunctivitis: Secondary | ICD-10-CM | POA: Diagnosis not present

## 2024-08-16 ENCOUNTER — Encounter: Payer: Self-pay | Admitting: Family Medicine

## 2024-08-16 DIAGNOSIS — F4312 Post-traumatic stress disorder, chronic: Secondary | ICD-10-CM | POA: Diagnosis not present

## 2024-08-16 DIAGNOSIS — F411 Generalized anxiety disorder: Secondary | ICD-10-CM | POA: Diagnosis not present

## 2024-08-17 ENCOUNTER — Telehealth: Payer: Self-pay

## 2024-08-17 ENCOUNTER — Ambulatory Visit

## 2024-08-17 NOTE — Telephone Encounter (Signed)
 Called patient for her AWV. LMOM to call back. I will try again in 10 minutes.

## 2024-08-18 ENCOUNTER — Telehealth: Payer: Self-pay

## 2024-08-18 ENCOUNTER — Encounter: Payer: Self-pay | Admitting: Pharmacist

## 2024-08-18 ENCOUNTER — Other Ambulatory Visit: Payer: Self-pay | Admitting: Pharmacist

## 2024-08-18 DIAGNOSIS — E1069 Type 1 diabetes mellitus with other specified complication: Secondary | ICD-10-CM

## 2024-08-18 NOTE — Patient Instructions (Signed)
 Please watch the mail for an envelope from [facility] containing the patient assistance program application. Please complete this application and bring to office to have it faxed back to Attention: Inocente Butcher at [facility] # [phone] along with a copy of your Medicare Part D prescription card and a copy of your proof of income document.   If you need to call Inocente, you can reach her at [phone].     If you need to reach out to patient assistance programs regarding refills or to find out the status of your application, you can do so by calling:   [employer] at [phone] AZ&Me at [phone]   Sharyle Sia, PharmD, [facility] [phone]

## 2024-08-18 NOTE — Progress Notes (Signed)
 08/18/2024 Name: Kristina Mccoy MRN: [id number] DOB: [birth year]  Chief Complaint  Patient presents with   Medication Assistance    Kristina Mccoy is a 68 y.o. year old female who was referred to the pharmacist by their PCP for assistance in managing medication access.   Receive message from PCP requesting outreach to patient for support with medication assistance as patient needing help with obtaining refills of her medications through [employer] patient assistance.   Subjective:  Care Team: Primary Care Provider: Ziglar, Susan K, MD ; Next Scheduled Visit: 11/09/2024 Endocrinologist: Fernande Stanton Millman, MD; Next Scheduled Visit: 09/13/2024 Rheumatologist: Marven Corean Barrio, NP  GI Specialist: Maryruth Ole Revel, MD   Medication Access/Adherence  Current Pharmacy:  [facility] [address] [place] - [address] [place] [address] Phone: [phone] Fax: [phone]  [facility] [place], [place] - [address] [place] [address] Phone: [phone] Fax: [phone]   Patient reports affordability concerns with their medications: Yes  Patient reports access/transportation concerns to their pharmacy: No  Patient reports adherence concerns with their medications:  No    Today patient reports that she is trying to receive refills of her [place], Novolog  and pen needles from [employer] patient assistance program and program has advised that they need new orders for each from prescriber. Also interested in support with applying for re-enrollment in assistance program for 2026 calendar year   Diabetes:  Patient followed by [facility] Endocrinology   Current medications:  - Tresiba 12 units every morning - Novolog  5 units three times daily before meals + reports uses additional correctional insulin  after 3-4 hours if BG still elevated as directed by Endocrinology Blood Sugar  For high blood sugar  < 80 Treat low  80-150 0  151-200 1  201-250 2  251-300 3  301-350 4  - Farxiga 5 mg daily  Using Freestyle Libre 3 Plus continuous glucose monitor - Confirms also has glucometer to use as back up  Today reports the following past 14-days of data per CGM: Date of Download: 08/18/24 Average Glucose: 183 mg/dL Time in Goal:  - Time in range 70-180: 51% - Time above range: 49% - Time below range: 0%   Reports carries glucose tablets in case needed for low blood sugar readings - Note patient also carries glucagon pen in case needed   Current medication access support: enrolled in patient assistance for Tresiba, Novolog  and pen needles from [employer] - Reports currently has 1 box (5 pens) of Novolog  Flexpen and 1 box + 3 pens of Tresiba remaining   Objective:  Lab Results  Component Value Date   HGBA1C 7.9 (A) 02/10/2024    Lab Results  Component Value Date   CREATININE 0.86 02/12/2024   BUN 16 02/12/2024   NA 138 02/12/2024   K 3.9 02/12/2024   CL 100 02/12/2024   CO2 21 02/12/2024    Lab Results  Component Value Date   CHOL 191 02/12/2024   HDL 85 02/12/2024   LDLCALC 93 02/12/2024   LDLDIRECT 153.0 06/19/2020   TRIG 74 02/12/2024   CHOLHDL 2.2 02/12/2024    Current Outpatient Medications on File Prior to Visit  Medication Sig Dispense Refill   dapagliflozin propanediol (FARXIGA) 5 MG TABS tablet Take 5 mg by mouth daily.     insulin  aspart (NOVOLOG ) 100 UNIT/ML FlexPen Inject 12 Units into the skin 3 (three) times  daily with meals. (Patient taking differently: Inject 5 Units into the skin 3 (three) times daily with meals. + reports uses additional correctional insulin  after 3-4 hours if BG still elevated as directed by Endocrinology) 15 mL 3   TRESIBA FLEXTOUCH 200 UNIT/ML FlexTouch Pen Inject 12 Units into the skin daily.     albuterol  (PROVENTIL ) (2.5 MG/3ML) 0.083% nebulizer solution Take 3 mLs (2.5 mg total) by nebulization every  4 (four) hours as needed for wheezing or shortness of breath. 75 mL 5   Albuterol -Budesonide (AIRSUPRA ) 90-80 MCG/ACT AERO Inhale 2 puffs into the lungs every 4 (four) hours as needed (wheezing or shortness of breath). (Patient not taking: Reported on 08/09/2024) 10.7 g 1   ALPRAZolam  (XANAX ) 0.5 MG tablet Take 1 tablet (0.5 mg total) by mouth 2 (two) times daily as needed for anxiety. 120 tablet 3   Azelastine  HCl 137 MCG/SPRAY SOLN PLACE 2 SPRAYS INTO BOTH NOSTRILS 2 (TWO) TIMES DAILY. USE IN EACH NOSTRIL AS DIRECTED 90 mL 2   baclofen  (LIORESAL ) 10 MG tablet Take 1 tablet (10 mg total) by mouth 3 (three) times daily as needed for muscle spasms. 90 tablet 1   candesartan (ATACAND) 8 MG tablet Take 4 mg by mouth daily.     cefadroxil  (DURICEF) 500 MG capsule Take 1 capsule (500 mg total) by mouth 2 (two) times daily. 14 capsule 0   clopidogrel (PLAVIX) 75 MG tablet Take 75 mg by mouth daily.     Continuous Blood Gluc Sensor (FREESTYLE LIBRE 14 DAY SENSOR) MISC SMARTSIG:1 Each Topical Every 2 Weeks     Continuous Glucose Receiver (FREESTYLE LIBRE 3 READER) DEVI SMARTSIG:1 Unit(s) Once     diclofenac Sodium (VOLTAREN) 1 % GEL Apply 1 Application topically 4 (four) times daily.     Elastic Bandages & Supports (SKINEEZ COMPRESSION ARM SLEEVE) MISC 1 each by Does not apply route daily. 1 each 0   ELIQUIS 5 MG TABS tablet Take 5 mg by mouth 2 (two) times daily.     Emollient (CERAVE) CREA Apply topically daily.      ezetimibe (ZETIA) 10 MG tablet Take 10 mg by mouth daily.     fluticasone -salmeterol (WIXELA INHUB) 500-50 MCG/ACT AEPB INHALE 1 PUFF INTO THE LUNGS IN THE MORNING AND AT BEDTIME. PLEASE SCHEDULE OFFICE VISIT BEFORE ANY FUTURE REFILLS. 60 each 11   Glucagon 3 MG/DOSE POWD      HYDROcodone -acetaminophen  (NORCO/VICODIN) 5-325 MG tablet Take 1 tablet by mouth every 8 (eight) hours as needed for moderate pain (pain score 4-6). 15 tablet 0   hydrocortisone 2.5 % cream APPLY TO AFFECTED AREA TWICE  A DAY     Insulin  Pen Needle 32G X 6 MM MISC Use 3 times daily with meals. 1 each 3   ketoconazole (NIZORAL) 2 % cream Apply 1 application topically 2 (two) times daily as needed.  3   ketoconazole (NIZORAL) 2 % shampoo Apply 1 application  topically as needed.  11   Lancets (ONETOUCH DELICA PLUS LANCET30G) MISC 4 (four) times daily.     metoprolol  succinate (TOPROL -XL) 50 MG 24 hr tablet Take 50 mg by mouth daily.     nitroGLYCERIN  (NITROSTAT ) 0.4 MG SL tablet Place 1 tablet (0.4 mg total) under the tongue every 5 (five) minutes as needed for chest pain. 50 tablet 3   ondansetron  (ZOFRAN ) 4 MG tablet TAKE 1 TABLET BY MOUTH EVERY 8 HOURS AS NEEDED FOR NAUSEA AND VOMITING 18 tablet 1   ONETOUCH ULTRA test strip 4 (  four) times daily. for testing     pantoprazole  (PROTONIX ) 40 MG tablet TAKE 1 TABLET BY MOUTH EVERY DAY 90 tablet 1   pseudoephedrine-guaifenesin  (MUCINEX  D) 60-600 MG 12 hr tablet Take 1 tablet by mouth 2 (two) times daily as needed.  (Patient not taking: Reported on 08/09/2024)     rosuvastatin  (CRESTOR ) 20 MG tablet Take 20 mg by mouth daily.     triamcinolone  (NASACORT ) 55 MCG/ACT AERO nasal inhaler Place 2 sprays into the nose daily. (Patient not taking: Reported on 08/09/2024)     ursodiol (ACTIGALL) 500 MG tablet Take 1,000 mg by mouth daily.     No current facility-administered medications on file prior to visit.        Assessment/Plan:   Conversation limited today; will plan to complete comprehensive medication review during our next telephone call  Diabetes: - Reviewed dietary modifications including importance of having regular well-balanced meals and snacks throughout the day, while controlling carbohydrate portion sizes Recommend patient follow carbohydrate guidance provided by Endocrinologist for meals and snacks - Recommend to continue to use Freestyle Libre 3 CGM to monitor blood sugar/as feedback on dietary choices Recommend to check glucose with fingerstick  check when needed for symptoms and as back up to CGM. Patient to contact office if needed for readings outside of established parameters or symptoms - Discuss monitoring for hypoglycemia. Encourage patient to continue to carry glucose tablets and glucagon in case needed - Based on reported income, patient does not meet criteria for Extra Help subsidy through [employer] - Will collaborate with CPhT and PCP to support patient with re-enrollment in [employer] PAP for 2026 calendar year Will also ask CPhT to initiate reorder for Tresiba, Novolog  and pen needles for patient to receive a refill in current calendar year - Will collaborate with CPhT and PCP to support patient with applying for AZ&Me patient assistance for Farxiga  Follow Up Plan: Clinical Pharmacist will follow up with patient by telephone on 09/15/2024 at 1:00 PM   Sharyle Sia, PharmD, [facility] [phone]

## 2024-08-18 NOTE — Telephone Encounter (Signed)
 PAP: Patient assistance application for Novolog  and Tresiba through [employer] has been mailed to [employer] home address on file. Provider portion of application will be faxed to provider's office.  PAP: Patient assistance application for Farxiga through [employer] (AZ&Me) has been mailed to pt's home address on file. Provider portion of application will be faxed to provider's office.   Provider portions of application has been faxed to Dr. Ziglar to review and sign

## 2024-08-20 ENCOUNTER — Other Ambulatory Visit: Payer: Self-pay | Admitting: Pharmacist

## 2024-08-20 DIAGNOSIS — E1069 Type 1 diabetes mellitus with other specified complication: Secondary | ICD-10-CM

## 2024-08-20 NOTE — Patient Instructions (Signed)
 Please watch the mail for an envelope from [facility] containing the patient assistance program application. Please complete this application and bring to office to have it faxed back to Attention: Inocente Butcher at [facility] # [phone] along with a copy of your proof of income document.   If you need to call Inocente, you can reach her at [phone].     If you need to reach out to patient assistance programs regarding refills or to find out the status of your application, you can do so by calling:   [employer] at [phone] AZ&Me at [phone]   Sharyle Sia, PharmD, [facility] [phone]

## 2024-08-20 NOTE — Progress Notes (Signed)
   08/20/2024  Patient ID: Kristina Mccoy, female   DOB: August 16, 1956, 68 y.o.   MRN: [id number]  Receive call from patient with further questions regarding medication assistance applications for [employer] and AZ&Me  Collaborate with CPhT Inocente Butcher to confirm for patient that applications were mailed to her PO Box address - Confirm for patient that [employer] re-enrollment application was not started electronically, rather will be done through paper application  Patient addresses question from CPhT regarding preferred mailing address for AZ&Me program. Patient prefers PO Box address. Let CPhT know via Secure Chat.  Advise patient that per review of chart, CMA [name] has already completed and faxed renewal request for her [place], Novolog  and pen needles to [employer] on 08/18/2024 (appears that a copy of reorder form was also sent to patient via MyChart on that day).  Follow Up Plan: Clinical Pharmacist will follow up with patient by telephone on 09/15/2024 at 1:00 PM    Sharyle Sia, PharmD, [facility] [phone]

## 2024-08-25 DIAGNOSIS — F411 Generalized anxiety disorder: Secondary | ICD-10-CM | POA: Diagnosis not present

## 2024-08-25 DIAGNOSIS — F4312 Post-traumatic stress disorder, chronic: Secondary | ICD-10-CM | POA: Diagnosis not present

## 2024-09-06 NOTE — Telephone Encounter (Signed)
 Received patient from will refax provider forms to Dr. Ziglar

## 2024-09-07 DIAGNOSIS — F4312 Post-traumatic stress disorder, chronic: Secondary | ICD-10-CM | POA: Diagnosis not present

## 2024-09-07 DIAGNOSIS — F411 Generalized anxiety disorder: Secondary | ICD-10-CM | POA: Diagnosis not present

## 2024-09-08 ENCOUNTER — Encounter: Payer: Self-pay | Admitting: Family Medicine

## 2024-09-08 ENCOUNTER — Other Ambulatory Visit: Payer: Self-pay | Admitting: Family Medicine

## 2024-09-08 DIAGNOSIS — F32A Depression, unspecified: Secondary | ICD-10-CM

## 2024-09-08 NOTE — Telephone Encounter (Unsigned)
 Copied from CRM #[id number]. Topic: Clinical - Medication Refill >> Sep 08, 2024 11:00 AM Zebedee SAUNDERS wrote: Medication: ALPRAZolam  (XANAX ) 0.5 MG tablet  Has the patient contacted their pharmacy? Yes (Agent: If no, request that the patient contact the pharmacy for the refill. If patient does not wish to contact the pharmacy document the reason why and proceed with request.) (Agent: If yes, when and what did the pharmacy advise?)  This is the patient's preferred pharmacy:  [id number] [place] [place] - [address] [place] [address] Phone: [phone] Fax: [phone]  Is this the correct pharmacy for this prescription? Yes If no, delete pharmacy and type the correct one.   Has the prescription been filled recently? Yes  Is the patient out of the medication? Yes  Has the patient been seen for an appointment in the last year OR does the patient have an upcoming appointment? Yes  Can we respond through MyChart? Yes  Agent: Please be advised that Rx refills may take up to 3 business days. We ask that you follow-up with your pharmacy.

## 2024-09-09 ENCOUNTER — Ambulatory Visit: Admitting: Podiatry

## 2024-09-09 ENCOUNTER — Other Ambulatory Visit: Payer: Self-pay | Admitting: Family Medicine

## 2024-09-09 DIAGNOSIS — D689 Coagulation defect, unspecified: Secondary | ICD-10-CM

## 2024-09-09 DIAGNOSIS — M79675 Pain in left toe(s): Secondary | ICD-10-CM

## 2024-09-09 DIAGNOSIS — F32A Depression, unspecified: Secondary | ICD-10-CM

## 2024-09-09 DIAGNOSIS — M79674 Pain in right toe(s): Secondary | ICD-10-CM | POA: Diagnosis not present

## 2024-09-09 DIAGNOSIS — B351 Tinea unguium: Secondary | ICD-10-CM

## 2024-09-09 DIAGNOSIS — E1069 Type 1 diabetes mellitus with other specified complication: Secondary | ICD-10-CM | POA: Diagnosis not present

## 2024-09-09 MED ORDER — ALPRAZOLAM 0.5 MG PO TABS: 0.5000 mg | ORAL_TABLET | Freq: Two times a day (BID) | ORAL | 3 refills | Status: DC | PRN

## 2024-09-09 NOTE — Telephone Encounter (Signed)
 PAP: Application for Kristina Mccoy has been submitted to [employer] (AZ&Me), via fax  PAP: Application for Novolog and [place] has been submitted to [employer], via fax

## 2024-09-10 ENCOUNTER — Other Ambulatory Visit: Payer: Self-pay | Admitting: Family Medicine

## 2024-09-10 DIAGNOSIS — F419 Anxiety disorder, unspecified: Secondary | ICD-10-CM

## 2024-09-10 MED ORDER — ALPRAZOLAM 0.5 MG PO TABS: 0.5000 mg | ORAL_TABLET | Freq: Four times a day (QID) | ORAL | 3 refills | Status: AC

## 2024-09-13 NOTE — Telephone Encounter (Signed)
 PAP: Patient assistance application for Farxiga has been approved by PAP Companies: AZ&ME from 09/13/2024 to 09/29/2025. Medication should be delivered to PAP Delivery: Home. For further shipping updates, please contact [employer] (AZ&Me) at [phone]. Patient ID is: [id number]

## 2024-09-13 NOTE — Telephone Encounter (Signed)
 Received fax from [employer] requesting Household size.  Completed verification form and faxed to [employer].

## 2024-09-15 ENCOUNTER — Encounter: Payer: Self-pay | Admitting: Internal Medicine

## 2024-09-15 ENCOUNTER — Other Ambulatory Visit: Payer: Self-pay | Admitting: Pharmacist

## 2024-09-15 DIAGNOSIS — J441 Chronic obstructive pulmonary disease with (acute) exacerbation: Secondary | ICD-10-CM

## 2024-09-15 DIAGNOSIS — I2510 Atherosclerotic heart disease of native coronary artery without angina pectoris: Secondary | ICD-10-CM

## 2024-09-15 DIAGNOSIS — I480 Paroxysmal atrial fibrillation: Secondary | ICD-10-CM

## 2024-09-15 DIAGNOSIS — J449 Chronic obstructive pulmonary disease, unspecified: Secondary | ICD-10-CM

## 2024-09-15 DIAGNOSIS — I10 Essential (primary) hypertension: Secondary | ICD-10-CM

## 2024-09-15 MED ORDER — ALBUTEROL SULFATE (2.5 MG/3ML) 0.083% IN NEBU: 2.5000 mg | INHALATION_SOLUTION | RESPIRATORY_TRACT | 5 refills | Status: AC | PRN

## 2024-09-15 MED ORDER — ALBUTEROL SULFATE HFA 108 (90 BASE) MCG/ACT IN AERS: 1.0000 | INHALATION_SPRAY | Freq: Four times a day (QID) | RESPIRATORY_TRACT | 12 refills | Status: AC | PRN

## 2024-09-15 NOTE — Progress Notes (Unsigned)
 09/15/2024 Name: Kristina Mccoy MRN: [id number] DOB: 1955/10/29  Chief Complaint  Patient presents with   Medication Assistance    Kristina Mccoy is a 68 y.o. year old female who presented for a telephone visit.   They were referred to the pharmacist by PCP for assistance in managing medication access.      Subjective:   Care Team: Primary Care Provider: Ziglar, Susan K, MD ; Next Scheduled Visit: 11/09/2024 Endocrinologist: Kristina Stanton Millman, MD; Next Scheduled Visit: 09/13/2024 Rheumatologist: Kristina Corean Barrio, NP  GI Specialist: Kristina Ole Revel, MD Pulmonologist: Kristina Scrivener, MD Eye Specialist: Kristina Cricket Mini, MD; Next Scheduled Visit: 09/17/2024 Cardiologist: Kristina Altamese Mering, MD; Next Scheduled Visit: 09/21/2024  Medication Access/Adherence  Current Pharmacy:  [facility] [address]  - [address] [place] [address] Phone: [phone] Fax: [phone]  [facility] [place], [place] - [address] [place] [address] Phone: [phone] Fax: [phone]   Patient reports affordability concerns with their medications: No  Patient reports access/transportation concerns to their pharmacy: No  Patient reports adherence concerns with their medications:  No     Atypical Diabetes:   Patient followed by [facility]. Last seen by Dr. Fernande on 09/13/2024   Current medications:  - Tresiba 12 units every morning - Novolog  - Taking ~5u prior to meals, with 2 units for small meals/snacks, and 3 units for medium meals as directed by Endocrinology - Farxiga 5 mg daily   Previous therapies tried: Rybelsus, metformin (GI intolerance), Trulicity  Using Freestyle Libre 3 Plus continuous glucose monitor with reader device - Confirms also has glucometer to use as back up   Recalls recent morning fasting readings ranging ~92-150 *Attributes  higher morning readings to when snacks the nights prior   Reports carries glucose tablets in case needed for low blood sugar readings - Note patient also carries glucagon pen in case needed     Current medication access support: enrolled in patient assistance for Tresiba, Novolog  and pen needles from [employer] - Reports currently has 6 pen of Novolog  Flexpen and 6 pens of Tresiba remaining, but has supply of    Atrial Fibrillation:  Current medications: Rate Control:  Rhythm Control:  Anticoagulation Regimen: Eliquis 5 mg twice daily  Medications tried in the past   Current blood pressure/heart rate readings:  - Today:117/77, HR 83  Current physical activity: ***   Hyperlipidemia/ASCVD Risk Reduction  Current lipid lowering medications:  Medications tried in the past:   Antiplatelet regimen: clopidogrel 75 mg daily  ASCVD History:  Family History:  Risk Factors:   Current physical activity: ***  Current medication access support: ***   Asthma/COPD? COPD:  Current medications: - Wixela 500-50 mcg   Medications tried in the past:   Reports *** exacerbations in the past year  Office Spirometry Results:    mMRC score: *** CAAT score: ***  Current medication access support: ***   Objective:  Lab Results  Component Value Date   HGBA1C 7.9 (A) 02/10/2024  Last checked ***  Lab Results  Component Value Date   CREATININE 0.86 02/12/2024   BUN 16 02/12/2024   NA 138 02/12/2024   Mccoy 3.9 02/12/2024   CL 100 02/12/2024   CO2 21 02/12/2024    Lab Results  Component Value Date   CHOL 191 02/12/2024   HDL 85 02/12/2024   LDLCALC 93 02/12/2024   LDLDIRECT  153.0 06/19/2020   TRIG 74 02/12/2024   CHOLHDL 2.2 02/12/2024   BP Readings from Last 3 Encounters:  08/09/24 135/81  06/23/24 110/80  05/26/24 115/72     Medications Reviewed Today   Medications were not reviewed in this encounter       Assessment/Plan:   Comprehensive  medication review performed; medication list updated in electronic medical record - Caution patient for risk of dizziness/sedation with medication including alprazolam , baclofen    Identify patient in need of a renewal for her albuterol  inhaler as current supply and prescription are expired. States will contact Pulmonology to request renewal  Diabetes: - Reviewed dietary modifications including importance of having regular well-balanced meals and snacks throughout the day, while controlling carbohydrate portion sizes Recommend patient follow carbohydrate guidance provided by Endocrinologist for meals and snacks - Recommend to continue to use Freestyle Libre 3 CGM to monitor blood sugar/as feedback on dietary choices Recommend to check glucose with fingerstick check when needed for symptoms and as back up to CGM. Patient to contact office if needed for readings outside of established parameters or symptoms - Discuss monitoring for hypoglycemia. Encourage patient to continue to carry glucose tablets and glucagon in case needed - Based on reported income, patient does not meet criteria for Extra Help subsidy through [employer] - Will collaborate with CPhT and PCP to support patient with re-enrollment in [employer] PAP for 2026 calendar year Will also ask CPhT to initiate reorder for Tresiba, Novolog  and pen needles for patient to receive a refill in current calendar year - Will collaborate with CPhT and PCP to support patient with applying for [employer] patient assistance for Farxiga     Follow Up Plan: Clinical Pharmacist will follow up with patient by telephone on 07/20/2025 at 1:00 PM   Kristina Mccoy, PharmD, [facility] [phone]

## 2024-09-16 NOTE — Patient Instructions (Signed)
 Goals Addressed             This Visit's Progress    Pharmacy Goals       If you need to reach out to patient assistance programs regarding refills or to find out the status of your application, you can do so by calling:   [employer] at [phone] [employer] at [phone]  Check your blood pressure daily, and any time you have concerning symptoms like headache, chest pain, dizziness, shortness of breath, or vision changes.   To appropriately check your blood pressure, make sure you do the following:  1) Avoid caffeine, exercise, or tobacco products for 30 minutes before checking. Empty your bladder. 2) Sit with your back supported in a flat-backed chair. Rest your arm on something flat (arm of the chair, table, etc). 3) Sit still with your feet flat on the floor, resting, for at least 5 minutes.  4) Check your blood pressure. Take 1-2 readings.  5) Write down these readings and bring with you to any provider appointments.  Bring your home blood pressure machine with you to a provider's office for accuracy comparison at least once a year.   Make sure you take your blood pressure medications before you come to any office visit, even if you were asked to fast for labs.

## 2024-09-18 ENCOUNTER — Encounter: Payer: Self-pay | Admitting: Podiatry

## 2024-09-18 NOTE — Progress Notes (Signed)
 "  Subjective:  Patient ID: Kristina Mccoy, female    DOB: 01/03/1956,  MRN: [id number]  Kristina Mccoy presents to clinic today for preventative diabetic foot care and painful elongated mycotic toenails 1-5 bilaterally which are tender when wearing enclosed shoe gear. Pain is relieved with periodic professional debridement. She is recovering from heart surgery. Chief Complaint  Patient presents with   Nail Problem    Thick painful toenails, 4 month follow up    Diabetes    A1C  7.9   New problem(s): None.   PCP is Ziglar, Devere POUR, MD.  Allergies[1]  Review of Systems: Negative except as noted in the HPI.  Objective: No changes noted in today's physical examination. There were no vitals filed for this visit. Kristina Mccoy is a pleasant 68 y.o. female in NAD. AAO x 3.  Vascular Examination: Capillary refill time immediate b/l. Vascular status intact b/l with palpable pedal pulses. Pedal hair present b/l. No pain with calf compression b/l. Skin temperature gradient WNL b/l. No cyanosis or clubbing b/l. No ischemia or gangrene noted b/l.   Neurological Examination: Sensation grossly intact b/l with 10 gram monofilament. Vibratory sensation intact b/l.   Dermatological Examination: Pedal skin with normal turgor, texture and tone b/l.  No open wounds. No interdigital macerations.   Toenails right hallux thick, discolored, elongated with subungual debris and pain on dorsal palpation.   No hyperkeratotic nor porokeratotic lesions.  Nondystrophic toenails 2-5 bilaterally and L hallux.  Musculoskeletal Examination: Muscle strength 5/5 to all lower extremity muscle groups bilaterally. Palpable fixed, firm subcutaneous lesions on plantar aspect of medial arch RLE consistent with plantar fibromatosis.  Assessment/Plan: 1. Pain due to onychomycosis of toenails of both feet   2. Blood clotting disorder   3. Type 1 diabetes mellitus with other specified complication (HCC)    -Consent given for treatment as described below: -Examined patient. -Continue foot and shoe inspections daily. Monitor blood glucose per PCP/Endocrinologist's recommendations. -Patient to continue soft, supportive shoe gear daily. -Mycotic toenails R hallux were debrided in length and girth with sterile nail nippers and dremel without iatrogenic bleeding. -Nondystrophic toenails trimmed 2-5 bilaterally and left great toe. -Patient/POA to call should there be question/concern in the interim.   Return in about 3 months (around 12/08/2024).  Kristina Mccoy, DPM      [place] LOCATION: [address], [place] [address]                   Office [phone]   [place] LOCATION: [address] [place], [place] [address] Office [phone]     [1]  Allergies Allergen Reactions   Gabapentin  Itching and Swelling   Sodium Fluoride Rash    Burning sensation on lips and redness   Wound Dressing Adhesive Dermatitis, Itching and Rash    Ingredient - Acrylic   Oxycodone  Itching, Rash and Dermatitis    Per patient, tolerated in the past   Repatha  [Evolocumab ] Other (See Comments)    abdominal distention, headache, higher blood sugars, facial skin peeling   Carbamazepine Nausea And Vomiting and Other (See Comments)    Abdominal pain    Doxycycline  Other (See Comments)   Dulaglutide  Other reaction(s): Other (See Comments) Severe stomach pain   Ezetimibe Other (See Comments)    myalgias   Insulin  Glargine Other (See Comments)    BLOATING, MUSCLE/JOINT PAIN   Levaquin  [Levofloxacin ]     Diarrhea and pain in calf   Metformin     Other reaction(s): Other (See Comments) Cramping- abdominal pain   Methylprednisolone     Reddened face, puffy face    Mirtazapine     Morphine Other (See Comments)   Pregabalin  Swelling    Swelling in arms and legs   Rosuvastatin  Other (See Comments)    LFTs increased     Venlafaxine Other (See Comments)   Buprenorphine Rash and Swelling   Canagliflozin  Other (See Comments) and Rash    YEAST INFECTIONS   Escitalopram  Rash   Hydroxychloroquine Rash   "

## 2024-10-01 NOTE — Telephone Encounter (Signed)
 PAP: Patient assistance application for Novolog  and [place] has been approved by PAP Companies: [employer] from 09/30/2024 to 09/29/2025. Medication should be delivered to PAP Delivery: Provider's office. For further shipping updates, please contact [employer] at [phone]. Patient ID is: [id number]

## 2024-10-14 ENCOUNTER — Encounter: Payer: Self-pay | Admitting: Family Medicine

## 2024-10-14 NOTE — Telephone Encounter (Signed)
 Please see mychart message sent by pt and advise.

## 2024-10-15 ENCOUNTER — Telehealth: Payer: Self-pay | Admitting: Family Medicine

## 2024-10-15 NOTE — Telephone Encounter (Signed)
 She wanted to know if she needs ABX dental prophylaxis for teeth cleaning.  She has never taken this before.  She has not had a valve repair of replacement and no congenital valvular disease.  Advised that she does not need dental prophylaxis.

## 2024-10-22 ENCOUNTER — Telehealth: Payer: Self-pay | Admitting: Family Medicine

## 2024-10-22 NOTE — Telephone Encounter (Signed)
 Asked her to pick up one pen of Triseba and Novolog .  The rest will be transported to the [facility] where they have a generator to keep the refrigeration on.  If she takes all of her insulin  and has a multiple day power outage she could loose all of her insulin .  She agrees.

## 2024-11-09 ENCOUNTER — Ambulatory Visit: Admitting: Family Medicine

## 2024-12-09 ENCOUNTER — Ambulatory Visit: Admitting: Podiatry

## 2025-07-20 ENCOUNTER — Other Ambulatory Visit: Admitting: Pharmacist
